# Patient Record
Sex: Male | Born: 1945 | Race: White | Hispanic: No | Smoking: Never smoker
Health system: Southern US, Community
[De-identification: ages and names within clinical notes are randomized; demographics above are authoritative.]

## PROBLEM LIST (undated history)

## (undated) DIAGNOSIS — Z87438 Personal history of other diseases of male genital organs: Secondary | ICD-10-CM

## (undated) DIAGNOSIS — F4321 Adjustment disorder with depressed mood: Secondary | ICD-10-CM

## (undated) DIAGNOSIS — F319 Bipolar disorder, unspecified: Secondary | ICD-10-CM

## (undated) DIAGNOSIS — F4310 Post-traumatic stress disorder, unspecified: Secondary | ICD-10-CM

## (undated) DIAGNOSIS — I2510 Atherosclerotic heart disease of native coronary artery without angina pectoris: Secondary | ICD-10-CM

## (undated) DIAGNOSIS — Z9289 Personal history of other medical treatment: Secondary | ICD-10-CM

## (undated) DIAGNOSIS — Z86010 Personal history of colonic polyps: Secondary | ICD-10-CM

## (undated) DIAGNOSIS — Z8701 Personal history of pneumonia (recurrent): Secondary | ICD-10-CM

## (undated) DIAGNOSIS — E785 Hyperlipidemia, unspecified: Secondary | ICD-10-CM

## (undated) DIAGNOSIS — F329 Major depressive disorder, single episode, unspecified: Secondary | ICD-10-CM

## (undated) DIAGNOSIS — F440 Dissociative amnesia: Secondary | ICD-10-CM

## (undated) DIAGNOSIS — F419 Anxiety disorder, unspecified: Secondary | ICD-10-CM

## (undated) DIAGNOSIS — M1990 Unspecified osteoarthritis, unspecified site: Secondary | ICD-10-CM

## (undated) DIAGNOSIS — I10 Essential (primary) hypertension: Secondary | ICD-10-CM

## (undated) DIAGNOSIS — I219 Acute myocardial infarction, unspecified: Secondary | ICD-10-CM

## (undated) DIAGNOSIS — I252 Old myocardial infarction: Secondary | ICD-10-CM

## (undated) DIAGNOSIS — D649 Anemia, unspecified: Secondary | ICD-10-CM

## (undated) DIAGNOSIS — B019 Varicella without complication: Secondary | ICD-10-CM

## (undated) DIAGNOSIS — G43909 Migraine, unspecified, not intractable, without status migrainosus: Secondary | ICD-10-CM

## (undated) DIAGNOSIS — F32A Depression, unspecified: Secondary | ICD-10-CM

## (undated) HISTORY — DX: Personal history of other medical treatment: Z92.89

## (undated) HISTORY — PX: KNEE CARTILAGE SURGERY: SHX688

## (undated) HISTORY — DX: Essential (primary) hypertension: I10

## (undated) HISTORY — DX: Anemia, unspecified: D64.9

## (undated) HISTORY — DX: Personal history of colonic polyps: Z86.010

## (undated) HISTORY — DX: Atherosclerotic heart disease of native coronary artery without angina pectoris: I25.10

## (undated) HISTORY — DX: Acute myocardial infarction, unspecified: I21.9

## (undated) HISTORY — PX: TONSILLECTOMY: SUR1361

## (undated) HISTORY — PX: ELBOW SURGERY: SHX618

## (undated) HISTORY — DX: Dissociative amnesia: F44.0

## (undated) HISTORY — PX: CATARACT EXTRACTION: SUR2

## (undated) HISTORY — DX: Bipolar disorder, unspecified: F31.9

## (undated) HISTORY — DX: Major depressive disorder, single episode, unspecified: F32.9

## (undated) HISTORY — DX: Adjustment disorder with depressed mood: F43.21

## (undated) HISTORY — DX: Hyperlipidemia, unspecified: E78.5

## (undated) HISTORY — PX: INGUINAL HERNIA REPAIR: SUR1180

## (undated) HISTORY — DX: Post-traumatic stress disorder, unspecified: F43.10

## (undated) HISTORY — DX: Old myocardial infarction: I25.2

## (undated) HISTORY — DX: Unspecified osteoarthritis, unspecified site: M19.90

## (undated) HISTORY — PX: NASAL SEPTUM SURGERY: SHX37

## (undated) HISTORY — DX: Varicella without complication: B01.9

## (undated) HISTORY — DX: Personal history of other diseases of male genital organs: Z87.438

## (undated) HISTORY — PX: CERVICAL DISCECTOMY: SHX98

## (undated) HISTORY — DX: Migraine, unspecified, not intractable, without status migrainosus: G43.909

## (undated) HISTORY — PX: ANKLE SURGERY: SHX546

## (undated) HISTORY — DX: Personal history of pneumonia (recurrent): Z87.01

## (undated) HISTORY — DX: Anxiety disorder, unspecified: F41.9

## (undated) HISTORY — DX: Depression, unspecified: F32.A

## (undated) HISTORY — PX: KNEE ARTHROSCOPY: SUR90

## (undated) SURGERY — Surgical Case
Anesthesia: *Unknown

---

## 1998-02-27 ENCOUNTER — Ambulatory Visit (HOSPITAL_BASED_OUTPATIENT_CLINIC_OR_DEPARTMENT_OTHER): Admission: RE | Admit: 1998-02-27 | Discharge: 1998-02-27 | Payer: Self-pay | Admitting: *Deleted

## 1999-10-08 ENCOUNTER — Encounter: Admission: RE | Admit: 1999-10-08 | Discharge: 1999-10-08 | Payer: Self-pay | Admitting: Neurology

## 1999-10-08 ENCOUNTER — Encounter: Payer: Self-pay | Admitting: Neurology

## 2000-04-02 ENCOUNTER — Emergency Department (HOSPITAL_COMMUNITY): Admission: EM | Admit: 2000-04-02 | Discharge: 2000-04-03 | Payer: Self-pay | Admitting: *Deleted

## 2002-01-29 ENCOUNTER — Inpatient Hospital Stay (HOSPITAL_COMMUNITY): Admission: EM | Admit: 2002-01-29 | Discharge: 2002-02-06 | Payer: Self-pay | Admitting: Psychiatry

## 2002-04-08 ENCOUNTER — Inpatient Hospital Stay (HOSPITAL_COMMUNITY): Admission: EM | Admit: 2002-04-08 | Discharge: 2002-04-12 | Payer: Self-pay | Admitting: Psychiatry

## 2002-04-15 ENCOUNTER — Other Ambulatory Visit (HOSPITAL_COMMUNITY): Admission: RE | Admit: 2002-04-15 | Discharge: 2002-05-06 | Payer: Self-pay | Admitting: Psychiatry

## 2002-11-28 ENCOUNTER — Encounter (INDEPENDENT_AMBULATORY_CARE_PROVIDER_SITE_OTHER): Payer: Self-pay | Admitting: Specialist

## 2002-11-28 ENCOUNTER — Ambulatory Visit (HOSPITAL_COMMUNITY): Admission: RE | Admit: 2002-11-28 | Discharge: 2002-11-28 | Payer: Self-pay | Admitting: Internal Medicine

## 2003-01-26 ENCOUNTER — Inpatient Hospital Stay (HOSPITAL_COMMUNITY): Admission: AD | Admit: 2003-01-26 | Discharge: 2003-01-31 | Payer: Self-pay | Admitting: Psychiatry

## 2003-05-30 ENCOUNTER — Inpatient Hospital Stay (HOSPITAL_COMMUNITY): Admission: EM | Admit: 2003-05-30 | Discharge: 2003-06-04 | Payer: Self-pay | Admitting: Psychiatry

## 2003-06-13 ENCOUNTER — Inpatient Hospital Stay (HOSPITAL_COMMUNITY): Admission: EM | Admit: 2003-06-13 | Discharge: 2003-06-17 | Payer: Self-pay | Admitting: Psychiatry

## 2004-11-26 ENCOUNTER — Ambulatory Visit: Payer: Self-pay | Admitting: Psychology

## 2006-02-17 ENCOUNTER — Inpatient Hospital Stay (HOSPITAL_COMMUNITY): Admission: EM | Admit: 2006-02-17 | Discharge: 2006-02-23 | Payer: Self-pay | Admitting: Psychiatry

## 2006-02-17 ENCOUNTER — Emergency Department (HOSPITAL_COMMUNITY): Admission: EM | Admit: 2006-02-17 | Discharge: 2006-02-17 | Payer: Self-pay | Admitting: *Deleted

## 2006-02-18 ENCOUNTER — Ambulatory Visit: Payer: Self-pay | Admitting: *Deleted

## 2006-03-12 ENCOUNTER — Ambulatory Visit: Payer: Self-pay | Admitting: Psychiatry

## 2006-03-12 ENCOUNTER — Inpatient Hospital Stay (HOSPITAL_COMMUNITY): Admission: AD | Admit: 2006-03-12 | Discharge: 2006-03-17 | Payer: Self-pay | Admitting: Psychiatry

## 2006-05-24 ENCOUNTER — Ambulatory Visit: Payer: Self-pay | Admitting: Psychiatry

## 2006-05-24 ENCOUNTER — Inpatient Hospital Stay (HOSPITAL_COMMUNITY): Admission: RE | Admit: 2006-05-24 | Discharge: 2006-05-29 | Payer: Self-pay | Admitting: Psychiatry

## 2007-06-06 ENCOUNTER — Emergency Department (HOSPITAL_COMMUNITY): Admission: EM | Admit: 2007-06-06 | Discharge: 2007-06-06 | Payer: Self-pay | Admitting: Family Medicine

## 2007-06-08 ENCOUNTER — Emergency Department (HOSPITAL_COMMUNITY): Admission: EM | Admit: 2007-06-08 | Discharge: 2007-06-08 | Payer: Self-pay | Admitting: Emergency Medicine

## 2008-08-22 DIAGNOSIS — I252 Old myocardial infarction: Secondary | ICD-10-CM

## 2008-08-22 HISTORY — DX: Old myocardial infarction: I25.2

## 2009-04-23 DIAGNOSIS — J45909 Unspecified asthma, uncomplicated: Secondary | ICD-10-CM | POA: Insufficient documentation

## 2009-08-22 DIAGNOSIS — Z8701 Personal history of pneumonia (recurrent): Secondary | ICD-10-CM

## 2009-08-22 HISTORY — DX: Personal history of pneumonia (recurrent): Z87.01

## 2010-09-02 HISTORY — PX: STENT PLACEMENT VASCULAR (ARMC HX): HXRAD1737

## 2010-09-12 ENCOUNTER — Encounter: Payer: Self-pay | Admitting: Family Medicine
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
## 2011-01-07 NOTE — Discharge Summary (Signed)
NAME:  Justin [name], Justin [name] NO.:  [id number]   MEDICAL RECORD NO.:  [id number]          PATIENT TYPE:  IPS   LOCATION:  0507                          FACILITY:  [facility]   PHYSICIAN:  Geoffery Lyons, M.D.      DATE OF BIRTH:  03-21-1946   DATE OF ADMISSION:  05/24/2006  DATE OF DISCHARGE:  05/29/2006                                 DISCHARGE SUMMARY   HISTORY OF PRESENT ILLNESS:  This is one of multiple admissions to [facility] for this 65 year old, white male, separated who took  five Ambien the day before this admission feeling anxious and agitated  unable to sleep because of constant thinking and agitation about the  situation with his wife from whom he is separated.  He has been separated  from her since this summer.  He recently found out that she was going out  with another man and now was feeling that he will be unable to go on living  unless he could be with her.  He is unable to get this out of his mind with  thoughts of killing himself by hanging and overdosing.   PAST PSYCHIATRIC HISTORY:  1. Prior history of overdose July 2007.  2. Several admissions to [facility] with last time July 22, to July 27.  3. Prior history of depression and alcohol abuse.  Has been on Wellbutrin,      Zyprexa, Trileptal, Seroquel.  4. Prior history of alcohol abuse.   PAST MEDICAL HISTORY:  Lumbar disc disease with surgeries in 1998 and 2000,  with nerve damage in his right elbow.   MEDICATIONS:  1. Valium 10 mg up to four times a day.  2. Ambien 10 at night.  3. Cymbalta 90 mg per day.  4. Neurontin 300 four times a day.   PHYSICAL EXAMINATION:  Performed and failed to find any acute findings.   MENTAL STATUS EXAMINATION:  Alert, cooperative male with blunt affective,  appears anxious, speaks with soft tone, normal in pace and production.  Mood  is depressed and anxious.  Affect anxious and feeling that there is no way  he can live without his wife.  No acute  delusions.  No homicidal ideations.   LABORATORY DATA AND X-RAY FINDINGS:  White blood cells 6.1, hemoglobin 12.6.  Blood chemistry with sodium 129, potassium 3.9, glucose 122.  Liver enzymes  showed SGPT 16, total bilirubin 1.5, TSH 0.218.  Drug screen for  benzodiazepine as described.   IMPRESSION:  AXIS I:  Major depression.  AXIS II:  No diagnosis.  AXIS III:  Chronic back pain.  AXIS IV:  Moderate; family stressors.  AXIS V:  GAF 65.   HOSPITAL COURSE:  He was admitted and he was started in group psychotherapy.  He was given some Risperdal 0.5 every 6 hours as needed and maintained on  the Percocet 5 one to two four times a day as needed for pain and Valium 5  mg four times a day as needed for anxiety, Cymbalta 90 mg per day, Neurontin  300 four times a  day and Seroquel at bedtime for sleep.  He endorsed  increased symptoms of depression and claims voices are telling him to hurt  himself.  He claims the trigger was his wife giving him mixed messages as if  they were going to get back together and he could not take care of his mind.  Initially, he had severe headache.  By October 5, he was more alert,  insightful in the sense that he would have to let go of his wife and getting  himself out of the mood that without her he could not make it.  We tried to  get a family session in place, but the wife could not be contacted.  Sleep  had been an issue and was given the Seroquel successfully.  During the week  before, he had heard voices telling him to harm himself and calling him  names.  He slept better on the Seroquel.  By October 7, he was calmer and  feeling better.  He was planning to go back to [place] where he was living  alone.  By October 8, he was in full reality.  There were no episodes of  homicidal ideation, no hallucinations or delusions feeling that he was ready  to go home.  He was much more clear headed, insightful in the fact that he  needed to let go of the  relationship.  The fact that she did not come to the  family session, he claims that was a clear message that she did not want to  pursue the relationship anymore.   DISCHARGE DIAGNOSES:  AXIS I:  Major depression with psychotic features.  AXIS II:  No diagnosis.  AXIS III:  Chronic low back pain.  AXIS IV:  Moderate.  AXIS V:  GAF 50.   DISCHARGE MEDICATIONS:  1. Cymbalta 90 mg a day.  2. Neurontin 300 four time a day.  3. Seroquel 50 at bedtime.  4. Percocet 5/325 one to two four times a day as needed.  5. Valium 5 mg four times a day as needed for anxiety.   FOLLOW UP:  Follow up with Ollen Gross.      Geoffery Lyons, M.D.  Electronically Signed     IL/MEDQ  D:  06/19/2006  T:  06/19/2006  Job:  [id number]

## 2011-06-01 LAB — CULTURE, ROUTINE-ABSCESS: Gram Stain: NONE SEEN

## 2014-12-23 ENCOUNTER — Encounter: Payer: Self-pay | Admitting: Family

## 2014-12-23 ENCOUNTER — Ambulatory Visit (INDEPENDENT_AMBULATORY_CARE_PROVIDER_SITE_OTHER): Payer: Medicare Other | Admitting: Family

## 2014-12-23 VITALS — BP 142/100 | HR 77 | Temp 97.5°F | Resp 18 | Ht 72.0 in | Wt 197.0 lb

## 2014-12-23 DIAGNOSIS — IMO0001 Reserved for inherently not codable concepts without codable children: Secondary | ICD-10-CM

## 2014-12-23 DIAGNOSIS — M545 Low back pain, unspecified: Secondary | ICD-10-CM | POA: Insufficient documentation

## 2014-12-23 DIAGNOSIS — R030 Elevated blood-pressure reading, without diagnosis of hypertension: Secondary | ICD-10-CM

## 2014-12-23 DIAGNOSIS — F411 Generalized anxiety disorder: Secondary | ICD-10-CM

## 2014-12-23 DIAGNOSIS — I10 Essential (primary) hypertension: Secondary | ICD-10-CM | POA: Insufficient documentation

## 2014-12-23 MED ORDER — ALPRAZOLAM 0.5 MG PO TABS: 0.5000 mg | ORAL_TABLET | Freq: Two times a day (BID) | ORAL | Status: DC | PRN

## 2014-12-23 NOTE — Assessment & Plan Note (Signed)
Patient noted to have elevated systolic blood pressure today. Follow-up in 1 month for blood pressure check.

## 2014-12-23 NOTE — Patient Instructions (Addendum)
Thank you for choosing [facility].  Summary/Instructions:  Your prescription(s) have been submitted to your pharmacy or been printed and provided for you. Please take as directed and contact our office if you believe you are having problem(s) with the medication(s) or have any questions.  If your symptoms worsen or fail to improve, please contact our office for further instruction, or in case of emergency go directly to the emergency room at the closest medical facility.   Generalized Anxiety Disorder Generalized anxiety disorder (GAD) is a mental disorder. It interferes with life functions, including relationships, work, and school. GAD is different from normal anxiety, which everyone experiences at some point in their lives in response to specific life events and activities. Normal anxiety actually helps us prepare for and get through these life events and activities. Normal anxiety goes away after the event or activity is over.  GAD causes anxiety that is not necessarily related to specific events or activities. It also causes excess anxiety in proportion to specific events or activities. The anxiety associated with GAD is also difficult to control. GAD can vary from mild to severe. People with severe GAD can have intense waves of anxiety with physical symptoms (panic attacks).  SYMPTOMS The anxiety and worry associated with GAD are difficult to control. This anxiety and worry are related to many life events and activities and also occur more days than not for 6 months or longer. People with GAD also have three or more of the following symptoms (one or more in children):  Restlessness.   Fatigue.  Difficulty concentrating.   Irritability.  Muscle tension.  Difficulty sleeping or unsatisfying sleep. DIAGNOSIS GAD is diagnosed through an assessment by your health care provider. Your health care provider will ask you questions aboutyour mood,physical symptoms, and events in  your life. Your health care provider may ask you about your medical history and use of alcohol or drugs, including prescription medicines. Your health care provider may also do a physical exam and blood tests. Certain medical conditions and the use of certain substances can cause symptoms similar to those associated with GAD. Your health care provider may refer you to a mental health specialist for further evaluation. TREATMENT The following therapies are usually used to treat GAD:   Medication. Antidepressant medication usually is prescribed for long-term daily control. Antianxiety medicines may be added in severe cases, especially when panic attacks occur.   Talk therapy (psychotherapy). Certain types of talk therapy can be helpful in treating GAD by providing support, education, and guidance. A form of talk therapy called cognitive behavioral therapy can teach you healthy ways to think about and react to daily life events and activities.  Stress managementtechniques. These include yoga, meditation, and exercise and can be very helpful when they are practiced regularly. A mental health specialist can help determine which treatment is best for you. Some people see improvement with one therapy. However, other people require a combination of therapies. Document Released: 12/03/2012 Document Revised: 12/23/2013 Document Reviewed: 12/03/2012 [employer] Patient Information 2015 [employer]. This information is not intended to replace advice given to you by your health care provider. Make sure you discuss any questions you have with your health care provider.    

## 2014-12-23 NOTE — Assessment & Plan Note (Signed)
Low back pain from questionable source of either bulging disc or paraspinal muscle spasm. Treat conservatively at this time with heat, stretching, and over-the-counter remedies as needed for discomfort. If symptoms worsen or fail to improve additional imaging will be ordered.

## 2014-12-23 NOTE — Progress Notes (Signed)
Subjective:    Patient ID: Justin [name], male    DOB: 21-Sep-1945, 69 y.o.   MRN: [id number]  Chief Complaint  Patient presents with  . Establish Care    has a place on his back that has been hurting every morning when he gets up and sometimes during the day, x2 years getting worse, also needs something for anxiety    HPI:  Justin [name] is a 69 y.o. male with a PMH of arthritis, asthma, depression, anxiety, and hyperlipidemia who presents today for an office visit to establish care    1) Anxiety - Previously diagnosed with anxiety and depression. Currently maintained on amitriptyline for anxiety, depression and sleep. Indicates that he experiences more anxiety than he does depression. There is sleep disturbance with interrupted sleep secondary to his thought processes. Cannot take Ambien - states he woke up in the Emergency Room 3 times following medication use. Was previously tried on Klonopin and Xanax. Denies any adverse effects of either of these medications.   2) Scoliosis - Associated symptom of pain located on the left side of his low back towards his spine has been going on for about 2 years. Describes the pain as sharp and indicates it may take 2-3 hours for him to loosen his back up over the course of the day. Timing of the symptoms is worse in the mornings.  Has tried physical therapy for several weeks and relaxed his muscles, but did not last very long. Has also tried a heating pad which helped some. Indicates that it feels like he gets knots in his back.   Allergies  Allergen Reactions  . Ambien [Zolpidem Tartrate] Other (See Comments)    Unknown reaction    No current outpatient prescriptions on file prior to visit.   No current facility-administered medications on file prior to visit.    Past Medical History  Diagnosis Date  . Asthma   . Arthritis   . Chicken pox   . Depression   . Hyperlipidemia   . Migraines     Past Surgical History  Procedure Laterality  Date  . Nasal septum surgery    . Hernia repair    . Neck surgery    . Elbow surgery Left   . Knee surgery Bilateral     Family History  Problem Relation Age of Onset  . Arthritis Mother   . Heart disease Mother   . Hypertension Mother   . Arthritis Father   . Hyperlipidemia Father   . Heart disease Father   . Stroke Father   . Hypertension Father   . Multiple sclerosis Sister   . Diabetes Sister   . Lung cancer Paternal Grandmother   . Diabetes Sister     History   Social History  . Marital Status: Married    Spouse Name: N/A  . Number of Children: 2  . Years of Education: 12   Occupational History  . Retired    Social History Main Topics  . Smoking status: Never Smoker   . Smokeless tobacco: Never Used  . Alcohol Use: No  . Drug Use: No  . Sexual Activity: Not on file   Other Topics Concern  . Not on file   Social History Narrative   Fun: Work out in the yard.   Denies religious beliefs effecting healthcare.     Review of Systems  Musculoskeletal: Positive for back pain.  Neurological: Negative for numbness.       Objective:  BP 142/100 mmHg  Pulse 77  Temp(Src) 97.5 F (36.4 C) (Oral)  Resp 18  Ht 6' (1.829 m)  Wt 197 lb (89.359 kg)  BMI 26.71 kg/m2  SpO2 98% Nursing note and vital signs reviewed.  Physical Exam  Constitutional: He is oriented to person, place, and time. He appears well-developed and well-nourished. No distress.  Cardiovascular: Normal rate, regular rhythm, normal heart sounds and intact distal pulses.   Pulmonary/Chest: Effort normal and breath sounds normal.  Musculoskeletal:  No obvious deformity, discoloration, or edema of low back noted. Palpable tenderness along paraspinal musculature of left lumbar spine. Patient is able to flex, however with pain. Extension is described as stretching. Rotation and lateral bending both result in pain in both directions. Straight leg raise is negative. Reflexes are intact and  appropriate. Distal pulses and sensation are intact and appropriate.  Neurological: He is alert and oriented to person, place, and time.  Skin: Skin is warm and dry.  Psychiatric: He has a normal mood and affect. His behavior is normal. Judgment and thought content normal.       Assessment & Plan:

## 2014-12-23 NOTE — Assessment & Plan Note (Signed)
Symptoms consistent with generalized anxiety disorder that are resulting in a sleep disturbance. Discontinue amitriptyline and start Xanax as needed for anxiety and sleep. Follow-up in one month to determine effectiveness of current strategy. Discussed use of good sleep hygiene and over-the-counter melatonin as needed for sleep as well.

## 2014-12-23 NOTE — Progress Notes (Signed)
Pre visit review using our clinic review tool, if applicable. No additional management support is needed unless otherwise documented below in the visit note. 

## 2014-12-26 ENCOUNTER — Emergency Department (HOSPITAL_COMMUNITY)
Admission: EM | Admit: 2014-12-26 | Discharge: 2014-12-26 | Disposition: A | Discharge status: Home / Self Care | Payer: Medicare Other | Attending: Emergency Medicine | Admitting: Emergency Medicine

## 2014-12-26 ENCOUNTER — Encounter (HOSPITAL_COMMUNITY): Payer: Self-pay | Admitting: Emergency Medicine

## 2014-12-26 DIAGNOSIS — M545 Low back pain: Secondary | ICD-10-CM | POA: Diagnosis present

## 2014-12-26 DIAGNOSIS — J45909 Unspecified asthma, uncomplicated: Secondary | ICD-10-CM | POA: Insufficient documentation

## 2014-12-26 DIAGNOSIS — Z79899 Other long term (current) drug therapy: Secondary | ICD-10-CM | POA: Diagnosis not present

## 2014-12-26 DIAGNOSIS — F329 Major depressive disorder, single episode, unspecified: Secondary | ICD-10-CM | POA: Insufficient documentation

## 2014-12-26 DIAGNOSIS — Z7902 Long term (current) use of antithrombotics/antiplatelets: Secondary | ICD-10-CM | POA: Diagnosis not present

## 2014-12-26 DIAGNOSIS — Z8619 Personal history of other infectious and parasitic diseases: Secondary | ICD-10-CM | POA: Insufficient documentation

## 2014-12-26 DIAGNOSIS — G8929 Other chronic pain: Secondary | ICD-10-CM | POA: Diagnosis not present

## 2014-12-26 DIAGNOSIS — E785 Hyperlipidemia, unspecified: Secondary | ICD-10-CM | POA: Insufficient documentation

## 2014-12-26 DIAGNOSIS — Z8679 Personal history of other diseases of the circulatory system: Secondary | ICD-10-CM | POA: Diagnosis not present

## 2014-12-26 LAB — URINALYSIS, ROUTINE W REFLEX MICROSCOPIC
BILIRUBIN URINE: NEGATIVE
Glucose, UA: NEGATIVE mg/dL
HGB URINE DIPSTICK: NEGATIVE
Ketones, ur: NEGATIVE mg/dL
LEUKOCYTES UA: NEGATIVE
Nitrite: NEGATIVE
Protein, ur: NEGATIVE mg/dL
Specific Gravity, Urine: 1.014 (ref 1.005–1.030)
Urobilinogen, UA: 0.2 mg/dL (ref 0.0–1.0)
pH: 6.5 (ref 5.0–8.0)

## 2014-12-26 MED ORDER — METHOCARBAMOL 500 MG PO TABS
1000.0000 mg | ORAL_TABLET | Freq: Once | ORAL | Status: AC
  Administered 2014-12-26: 1000 mg via ORAL
  Filled 2014-12-26: qty 2

## 2014-12-26 MED ORDER — HYDROCODONE-ACETAMINOPHEN 5-325 MG PO TABS: ORAL_TABLET | ORAL | Status: DC

## 2014-12-26 MED ORDER — PREDNISONE 20 MG PO TABS
60.0000 mg | ORAL_TABLET | Freq: Once | ORAL | Status: AC
  Administered 2014-12-26: 60 mg via ORAL
  Filled 2014-12-26: qty 3

## 2014-12-26 MED ORDER — METHOCARBAMOL 500 MG PO TABS: 1000.0000 mg | ORAL_TABLET | Freq: Four times a day (QID) | ORAL | Status: DC | PRN

## 2014-12-26 MED ORDER — PREDNISONE 50 MG PO TABS: ORAL_TABLET | ORAL | Status: DC

## 2014-12-26 MED ORDER — HYDROCODONE-ACETAMINOPHEN 5-325 MG PO TABS
1.0000 | ORAL_TABLET | Freq: Once | ORAL | Status: AC
  Administered 2014-12-26: 1 via ORAL
  Filled 2014-12-26: qty 1

## 2014-12-26 NOTE — Discharge Instructions (Signed)
Take robaxin and/or Vicodin for breakthrough pain, do not drink alcohol, drive, care for children or perfom other critical tasks while taking robaxin and/or Vicodin .  Please follow with your primary care doctor in the next 2 days for a check-up. They must obtain records for further management.   Do not hesitate to return to the Emergency Department for any new, worsening or concerning symptoms.   Back Pain, Adult Low back pain is very common. About 1 in 5 people have back pain.The cause of low back pain is rarely dangerous. The pain often gets better over time.About half of people with a sudden onset of back pain feel better in just 2 weeks. About 8 in 10 people feel better by 6 weeks.  CAUSES Some common causes of back pain include:  Strain of the muscles or ligaments supporting the spine.  Wear and tear (degeneration) of the spinal discs.  Arthritis.  Direct injury to the back. DIAGNOSIS Most of the time, the direct cause of low back pain is not known.However, back pain can be treated effectively even when the exact cause of the pain is unknown.Answering your caregiver's questions about your overall health and symptoms is one of the most accurate ways to make sure the cause of your pain is not dangerous. If your caregiver needs more information, he or she may order lab work or imaging tests (X-rays or MRIs).However, even if imaging tests show changes in your back, this usually does not require surgery. HOME CARE INSTRUCTIONS For many people, back pain returns.Since low back pain is rarely dangerous, it is often a condition that people can learn to [facility] their own.   Remain active. It is stressful on the back to sit or stand in one place. Do not sit, drive, or stand in one place for more than 30 minutes at a time. Take short walks on level surfaces as soon as pain allows.Try to increase the length of time you walk each day.  Do not stay in bed.Resting more than 1 or 2 days can  delay your recovery.  Do not avoid exercise or work.Your body is made to move.It is not dangerous to be active, even though your back may hurt.Your back will likely heal faster if you return to being active before your pain is gone.  Pay attention to your body when you bend and lift. Many people have less discomfortwhen lifting if they bend their knees, keep the load close to their bodies,and avoid twisting. Often, the most comfortable positions are those that put less stress on your recovering back.  Find a comfortable position to sleep. Use a firm mattress and lie on your side with your knees slightly bent. If you lie on your back, put a pillow under your knees.  Only take over-the-counter or prescription medicines as directed by your caregiver. Over-the-counter medicines to reduce pain and inflammation are often the most helpful.Your caregiver may prescribe muscle relaxant drugs.These medicines help dull your pain so you can more quickly return to your normal activities and healthy exercise.  Put ice on the injured area.  Put ice in a plastic bag.  Place a towel between your skin and the bag.  Leave the ice on for 15-20 minutes, 03-04 times a day for the first 2 to 3 days. After that, ice and heat may be alternated to reduce pain and spasms.  Ask your caregiver about trying back exercises and gentle massage. This may be of some benefit.  Avoid feeling anxious or  stressed.Stress increases muscle tension and can worsen back pain.It is important to recognize when you are anxious or stressed and learn ways to manage it.Exercise is a great option. SEEK MEDICAL CARE IF:  You have pain that is not relieved with rest or medicine.  You have pain that does not improve in 1 week.  You have new symptoms.  You are generally not feeling well. SEEK IMMEDIATE MEDICAL CARE IF:   You have pain that radiates from your back into your legs.  You develop new bowel or bladder control  problems.  You have unusual weakness or numbness in your arms or legs.  You develop nausea or vomiting.  You develop abdominal pain.  You feel faint. Document Released: 08/08/2005 Document Revised: 02/07/2012 Document Reviewed: 12/10/2013 [facility] Patient Information 2015 [place], [place]. This information is not intended to replace advice given to you by your health care provider. Make sure you discuss any questions you have with your health care provider.

## 2014-12-26 NOTE — ED Notes (Signed)
Pt is aware we need a urine sample. Pt stated he went before he left the house. Pt will notify when he is able to void

## 2014-12-26 NOTE — ED Notes (Signed)
Pt c/o chronic back pain, exacerbation onset yesterday, pain worsened further onset this morning, pain in back and left flank radiating into spine and hip. Pt denies any urinary symptoms.

## 2014-12-26 NOTE — ED Provider Notes (Signed)
CSN: [id number]     Arrival date & time 12/26/14  1222 History   First MD Initiated Contact with Patient 12/26/14 1240     Chief Complaint  Patient presents with  . Back Pain     (Consider location/radiation/quality/duration/timing/severity/associated sxs/prior Treatment) HPI   Justin [name] is a 69 y.o. male complaining of severe exacerbation of his chronic low back pain. Patient states that the pain is 8 out of 10, aching,left low back and radiates down to the gluteus. Patient has been taking amitriptyline at home with little relief. He denies any recent trauma, fever, chills, change in bowel or bladder habits, history of IV drug use, history of cancer weakness, numbness, difficulty ambulating. States that the pain was so severe that he laid in bed all day yesterday. States the pain is exacerbated by movement and sitting up from a supine position.   Past Medical History  Diagnosis Date  . Asthma   . Arthritis   . Chicken pox   . Depression   . Hyperlipidemia   . Migraines    Past Surgical History  Procedure Laterality Date  . Nasal septum surgery    . Hernia repair    . Neck surgery    . Elbow surgery Left   . Knee surgery Bilateral    Family History  Problem Relation Age of Onset  . Arthritis Mother   . Heart disease Mother   . Hypertension Mother   . Arthritis Father   . Hyperlipidemia Father   . Heart disease Father   . Stroke Father   . Hypertension Father   . Multiple sclerosis Sister   . Diabetes Sister   . Lung cancer Paternal Grandmother   . Diabetes Sister    History  Substance Use Topics  . Smoking status: Never Smoker   . Smokeless tobacco: Never Used  . Alcohol Use: No    Review of Systems  10 systems reviewed and found to be negative, except as noted in the HPI.   Allergies  Ambien  Home Medications   Prior to Admission medications   Medication Sig Start Date End Date Taking? Authorizing Provider  ALPRAZolam Duanne Moron) 0.5 MG tablet Take 1  tablet (0.5 mg total) by mouth 2 (two) times daily as needed for anxiety. 12/23/14  Yes Golden Circle, FNP  amitriptyline (ELAVIL) 50 MG tablet Take 25 mg by mouth daily as needed (back pain).   Yes Historical Provider, MD  clopidogrel (PLAVIX) 75 MG tablet Take 75 mg by mouth daily.   Yes Historical Provider, MD  rosuvastatin (CRESTOR) 20 MG tablet Take 20 mg by mouth daily.   Yes Historical Provider, MD  HYDROcodone-acetaminophen (NORCO/VICODIN) 5-325 MG per tablet Take 1-2 tablets by mouth every 6 hours as needed for pain and/or cough. 12/26/14   Gianah Batt, PA-C  methocarbamol (ROBAXIN) 500 MG tablet Take 2 tablets (1,000 mg total) by mouth 4 (four) times daily as needed (Pain). 12/26/14   Shenelle Klas, PA-C  nitroGLYCERIN (NITROSTAT) 0.3 MG SL tablet Place 0.3 mg under the tongue every 5 (five) minutes as needed for chest pain.    Historical Provider, MD  predniSONE (DELTASONE) 50 MG tablet Take 1 tablet daily with breakfast 12/26/14   Elmyra Ricks Tarin Johndrow, PA-C   BP 137/91 mmHg  Pulse 59  Temp(Src) 97.5 F (36.4 C) (Oral)  Resp 20  SpO2 100% Physical Exam  Constitutional: He appears well-developed and well-nourished.  HENT:  Head: Normocephalic.  Eyes: Conjunctivae are normal.  Neck: Normal  range of motion.  Cardiovascular: Normal rate, regular rhythm and intact distal pulses.   Pulmonary/Chest: Effort normal.  Abdominal: Soft. There is no tenderness.  Neurological: He is alert.  No point tenderness to percussion of lumbar spinal processes.  No TTP or paraspinal muscular spasm. Strength is 5 out of 5 to bilateral lower extremities at hip and knee; extensor hallucis longus 5 out of 5. Ankle strength 5 out of 5, no clonus, neurovascularly intact. No saddle anaesthesia. Patellar reflexes are 2+ bilaterally.    Straight leg raise is negative bilaterally.  Psychiatric: He has a normal mood and affect.  Nursing note and vitals reviewed.   ED Course  Procedures (including critical  care time) Labs Review Labs Reviewed  URINALYSIS, ROUTINE W REFLEX MICROSCOPIC    Imaging Review No results found.   EKG Interpretation None      MDM   Final diagnoses:  Acute exacerbation of chronic low back pain    Filed Vitals:   12/26/14 1233 12/26/14 1434  BP: 159/90 137/91  Pulse: 69 59  Temp: 97.4 F (36.3 C) 97.5 F (36.4 C)  TempSrc: Oral Oral  Resp: 19 20  SpO2: 100% 100%    Medications  HYDROcodone-acetaminophen (NORCO/VICODIN) 5-325 MG per tablet 1 tablet (1 tablet Oral Given 12/26/14 1343)  methocarbamol (ROBAXIN) tablet 1,000 mg (1,000 mg Oral Given 12/26/14 1343)  predniSONE (DELTASONE) tablet 60 mg (60 mg Oral Given 12/26/14 1343)    Justin [name] is a pleasant 69 y.o. male presenting with exacerbation of chronic back pain.  No neurological deficits and normal neuro exam.  Patient can walk but states is painful.  No loss of bowel or bladder control.  No concern for cauda equina.  No fever, night sweats, weight loss, h/o cancer, IVDU.  RICE protocol and pain medicine indicated and discussed with patient.  This is a shared visit with the attending physician who personally evaluated the patient and agrees with the care plan.   Evaluation does not show pathology that would require ongoing emergent intervention or inpatient treatment. Pt is hemodynamically stable and mentating appropriately. Discussed findings and plan with patient/guardian, who agrees with care plan. All questions answered. Return precautions discussed and outpatient follow up given.   New Prescriptions   HYDROCODONE-ACETAMINOPHEN (NORCO/VICODIN) 5-325 MG PER TABLET    Take 1-2 tablets by mouth every 6 hours as needed for pain and/or cough.   METHOCARBAMOL (ROBAXIN) 500 MG TABLET    Take 2 tablets (1,000 mg total) by mouth 4 (four) times daily as needed (Pain).   PREDNISONE (DELTASONE) 50 MG TABLET    Take 1 tablet daily with breakfast         Monico Blitz, PA-C 12/26/14  Radom Yao, MD 12/29/14 0930

## 2014-12-26 NOTE — ED Notes (Signed)
Patient was educated not to drive, operate heavy machinery, or drink alcohol while taking narcotic medication.  

## 2015-01-27 ENCOUNTER — Other Ambulatory Visit: Payer: Self-pay | Admitting: Family

## 2015-03-02 ENCOUNTER — Telehealth: Payer: Self-pay | Admitting: Family

## 2015-03-02 MED ORDER — ALPRAZOLAM 0.5 MG PO TABS: 0.5000 mg | ORAL_TABLET | Freq: Two times a day (BID) | ORAL | Status: DC | PRN

## 2015-03-02 NOTE — Telephone Encounter (Signed)
Patient requesting refill for ALPRAZolam (XANAX) 0.5 MG tablet [[id number]. Pharmacy is [facility] on [address] in [place]

## 2015-03-02 NOTE — Telephone Encounter (Signed)
Medication refilled

## 2015-03-13 ENCOUNTER — Ambulatory Visit (INDEPENDENT_AMBULATORY_CARE_PROVIDER_SITE_OTHER): Payer: Medicare Other | Admitting: Family

## 2015-03-13 ENCOUNTER — Encounter: Payer: Self-pay | Admitting: Family

## 2015-03-13 VITALS — BP 132/92 | HR 71 | Temp 97.8°F | Resp 18 | Ht 72.0 in | Wt 185.6 lb

## 2015-03-13 DIAGNOSIS — R21 Rash and other nonspecific skin eruption: Secondary | ICD-10-CM | POA: Diagnosis not present

## 2015-03-13 MED ORDER — CLOTRIMAZOLE-BETAMETHASONE 1-0.05 % EX CREA: 1.0000 "application " | TOPICAL_CREAM | Freq: Two times a day (BID) | CUTANEOUS | Status: DC

## 2015-03-13 NOTE — Assessment & Plan Note (Signed)
Symptoms and exam consistent of with possible fungal infection. Start Lotrisone. Follow up if symptoms worsen or fail to improve.

## 2015-03-13 NOTE — Patient Instructions (Addendum)
Thank you for choosing [facility].  Summary/Instructions:  Your prescription(s) have been submitted to your pharmacy or been printed and provided for you. Please take as directed and contact our office if you believe you are having problem(s) with the medication(s) or have any questions.  If your symptoms worsen or fail to improve, please contact our office for further instruction, or in case of emergency go directly to the emergency room at the closest medical facility.     

## 2015-03-13 NOTE — Progress Notes (Signed)
Pre visit review using our clinic review tool, if applicable. No additional management support is needed unless otherwise documented below in the visit note. 

## 2015-03-13 NOTE — Progress Notes (Signed)
   Subjective:    Patient ID: Justin [name], male    DOB: 1945/09/10, 69 y.o.   MRN: [id number]  Chief Complaint  Patient presents with  . Rash    x1 month, has a place on his left foot and it has started getting red and itchy    HPI:  Justin [name] is a 69 y.o. male with a PMH of  Anxiety, low back pain, and elevated systolic pressure who presents today for an acute office visit.  This is a new problem. Associated symptoms of a rash that is lcoated on his left foot near his 4th and 5th toes that has been going on for about 1 month. Described as itchy and red. Modifying factors include cortisone which helped with the itching but did not provide relief. Notes that the color of the rash has become darker since this morning.   Allergies  Allergen Reactions  . Ambien [Zolpidem Tartrate] Other (See Comments)    Unknown reaction    Current Outpatient Prescriptions on File Prior to Visit  Medication Sig Dispense Refill  . ALPRAZolam (XANAX) 0.5 MG tablet Take 1 tablet (0.5 mg total) by mouth 2 (two) times daily as needed. for anxiety 60 tablet 0  . amitriptyline (ELAVIL) 50 MG tablet Take 25 mg by mouth daily as needed (back pain).    Marland Kitchen clopidogrel (PLAVIX) 75 MG tablet Take 75 mg by mouth daily.    . nitroGLYCERIN (NITROSTAT) 0.3 MG SL tablet Place 0.3 mg under the tongue every 5 (five) minutes as needed for chest pain.    . rosuvastatin (CRESTOR) 20 MG tablet Take 20 mg by mouth daily.     No current facility-administered medications on file prior to visit.    Review of Systems  Constitutional: Negative for fever and chills.  Skin: Positive for rash.      Objective:    BP 132/92 mmHg  Pulse 71  Temp(Src) 97.8 F (36.6 C) (Oral)  Resp 18  Ht 6' (1.829 m)  Wt 185 lb 9.6 oz (84.188 kg)  BMI 25.17 kg/m2  SpO2 96% Nursing note and vital signs reviewed.  Physical Exam  Constitutional: He is oriented to person, place, and time. He appears well-developed and well-nourished. No  distress.  Cardiovascular: Normal rate, regular rhythm, normal heart sounds and intact distal pulses.   Pulmonary/Chest: Effort normal and breath sounds normal.  Neurological: He is alert and oriented to person, place, and time.  Skin: Skin is warm and dry.  Annular shaped rash with red borders and lighter center and mild scaling.  Psychiatric: He has a normal mood and affect. His behavior is normal. Judgment and thought content normal.       Assessment & Plan:   Problem List Items Addressed This Visit      Musculoskeletal and Integument   Rash and nonspecific skin eruption - Primary    Symptoms and exam consistent of with possible fungal infection. Start Lotrisone. Follow up if symptoms worsen or fail to improve.       Relevant Medications   clotrimazole-betamethasone (LOTRISONE) cream

## 2015-03-26 ENCOUNTER — Encounter: Payer: Self-pay | Admitting: Gastroenterology

## 2015-03-26 ENCOUNTER — Encounter: Payer: Self-pay | Admitting: Family

## 2015-03-26 ENCOUNTER — Telehealth: Payer: Self-pay | Admitting: Family

## 2015-03-26 ENCOUNTER — Other Ambulatory Visit (INDEPENDENT_AMBULATORY_CARE_PROVIDER_SITE_OTHER): Payer: Medicare Other

## 2015-03-26 ENCOUNTER — Ambulatory Visit (INDEPENDENT_AMBULATORY_CARE_PROVIDER_SITE_OTHER): Payer: Medicare Other | Admitting: Family

## 2015-03-26 VITALS — BP 134/92 | HR 91 | Temp 98.0°F | Resp 18 | Ht 72.0 in | Wt 185.0 lb

## 2015-03-26 DIAGNOSIS — Z125 Encounter for screening for malignant neoplasm of prostate: Secondary | ICD-10-CM | POA: Diagnosis not present

## 2015-03-26 DIAGNOSIS — Z0000 Encounter for general adult medical examination without abnormal findings: Secondary | ICD-10-CM | POA: Diagnosis not present

## 2015-03-26 DIAGNOSIS — E785 Hyperlipidemia, unspecified: Secondary | ICD-10-CM | POA: Diagnosis not present

## 2015-03-26 DIAGNOSIS — Z23 Encounter for immunization: Secondary | ICD-10-CM | POA: Diagnosis not present

## 2015-03-26 DIAGNOSIS — Z0001 Encounter for general adult medical examination with abnormal findings: Secondary | ICD-10-CM | POA: Insufficient documentation

## 2015-03-26 LAB — CBC
HEMATOCRIT: 44.2 % (ref 39.0–52.0)
Hemoglobin: 14.9 g/dL (ref 13.0–17.0)
MCHC: 33.6 g/dL (ref 30.0–36.0)
MCV: 93.4 fl (ref 78.0–100.0)
PLATELETS: 316 10*3/uL (ref 150.0–400.0)
RBC: 4.74 Mil/uL (ref 4.22–5.81)
RDW: 13.4 % (ref 11.5–15.5)
WBC: 5.3 10*3/uL (ref 4.0–10.5)

## 2015-03-26 LAB — BASIC METABOLIC PANEL
BUN: 12 mg/dL (ref 6–23)
CO2: 28 meq/L (ref 19–32)
CREATININE: 1.06 mg/dL (ref 0.40–1.50)
Calcium: 10 mg/dL (ref 8.4–10.5)
Chloride: 101 mEq/L (ref 96–112)
GFR: 73.66 mL/min (ref >60.00)
GLUCOSE: 105 mg/dL — AB (ref 70–99)
Potassium: 4.9 mEq/L (ref 3.5–5.1)
Sodium: 136 mEq/L (ref 135–145)

## 2015-03-26 LAB — PSA: PSA: 1.48 ng/mL (ref 0.10–4.00)

## 2015-03-26 LAB — LIPID PANEL
Cholesterol: 141 mg/dL (ref 0–200)
HDL: 68.5 mg/dL (ref >39.00)
LDL Cholesterol: 55 mg/dL (ref 0–99)
NONHDL: 72.12
TRIGLYCERIDES: 87 mg/dL (ref 0.0–149.0)
Total CHOL/HDL Ratio: 2
VLDL: 17.4 mg/dL (ref 0.0–40.0)

## 2015-03-26 LAB — TSH: TSH: 0.59 u[IU]/mL (ref 0.35–4.50)

## 2015-03-26 MED ORDER — ALPRAZOLAM 0.5 MG PO TABS: 0.5000 mg | ORAL_TABLET | Freq: Two times a day (BID) | ORAL | Status: DC | PRN

## 2015-03-26 NOTE — Patient Instructions (Addendum)
Thank you for choosing [facility].  Summary/Instructions:  Your prescription(s) have been submitted to your pharmacy or been printed and provided for you. Please take as directed and contact our office if you believe you are having problem(s) with the medication(s) or have any questions.  Please stop by the lab on the basement level of the building for your blood work. Your results will be released to Jane Lew (or called to you) after review, usually within 72 hours after test completion. If any changes need to be made, you will be notified at that same time.  If your symptoms worsen or fail to improve, please contact our office for further instruction, or in case of emergency go directly to the emergency room at the closest medical facility.   Health Maintenance  Topic Date Due  . TETANUS/TDAP  05/19/1965  . COLONOSCOPY  05/19/1996  . ZOSTAVAX  05/19/2006  . PNA vac Low Risk Adult (1 of 2 - PCV13) 05/20/2011  . INFLUENZA VACCINE  03/23/2015    Health Maintenance A healthy lifestyle and preventative care can promote health and wellness.  Maintain regular health, dental, and eye exams.  Eat a healthy diet. Foods like vegetables, fruits, whole grains, low-fat dairy products, and lean protein foods contain the nutrients you need and are low in calories. Decrease your intake of foods high in solid fats, added sugars, and salt. Get information about a proper diet from your health care provider, if necessary.  Regular physical exercise is one of the most important things you can do for your health. Most adults should get at least 150 minutes of moderate-intensity exercise (any activity that increases your heart rate and causes you to sweat) each week. In addition, most adults need muscle-strengthening exercises on 2 or more days a week.   Maintain a healthy weight. The body mass index (BMI) is a screening tool to identify possible weight problems. It provides an estimate of body fat  based on height and weight. Your health care provider can find your BMI and can help you achieve or maintain a healthy weight. For males 20 years and older:  A BMI below 18.5 is considered underweight.  A BMI of 18.5 to 24.9 is normal.  A BMI of 25 to 29.9 is considered overweight.  A BMI of 30 and above is considered obese.  Maintain normal blood lipids and cholesterol by exercising and minimizing your intake of saturated fat. Eat a balanced diet with plenty of fruits and vegetables. Blood tests for lipids and cholesterol should begin at age 67 and be repeated every 5 years. If your lipid or cholesterol levels are high, you are over age 24, or you are at high risk for heart disease, you may need your cholesterol levels checked more frequently.Ongoing high lipid and cholesterol levels should be treated with medicines if diet and exercise are not working.  If you smoke, find out from your health care provider how to quit. If you do not use tobacco, do not start.  Lung cancer screening is recommended for adults aged 27-80 years who are at high risk for developing lung cancer because of a history of smoking. A yearly low-dose CT scan of the lungs is recommended for people who have at least a 30-pack-year history of smoking and are current smokers or have quit within the past 15 years. A pack year of smoking is smoking an average of 1 pack of cigarettes a day for 1 year (for example, a 30-pack-year history of smoking could  mean smoking 1 pack a day for 30 years or 2 packs a day for 15 years). Yearly screening should continue until the smoker has stopped smoking for at least 15 years. Yearly screening should be stopped for people who develop a health problem that would prevent them from having lung cancer treatment.  If you choose to drink alcohol, do not have more than 2 drinks per day. One drink is considered to be 12 oz (360 mL) of beer, 5 oz (150 mL) of wine, or 1.5 oz (45 mL) of liquor.  Avoid the  use of street drugs. Do not share needles with anyone. Ask for help if you need support or instructions about stopping the use of drugs.  High blood pressure causes heart disease and increases the risk of stroke. Blood pressure should be checked at least every 1-2 years. Ongoing high blood pressure should be treated with medicines if weight loss and exercise are not effective.  If you are 21-47 years old, ask your health care provider if you should take aspirin to prevent heart disease.  Diabetes screening involves taking a blood sample to check your fasting blood sugar level. This should be done once every 3 years after age 62 if you are at a normal weight and without risk factors for diabetes. Testing should be considered at a younger age or be carried out more frequently if you are overweight and have at least 1 risk factor for diabetes.  Colorectal cancer can be detected and often prevented. Most routine colorectal cancer screening begins at the age of 21 and continues through age 80. However, your health care provider may recommend screening at an earlier age if you have risk factors for colon cancer. On a yearly basis, your health care provider may provide home test kits to check for hidden blood in the stool. A small camera at the end of a tube may be used to directly examine the colon (sigmoidoscopy or colonoscopy) to detect the earliest forms of colorectal cancer. Talk to your health care provider about this at age 58 when routine screening begins. A direct exam of the colon should be repeated every 5-10 years through age 81, unless early forms of precancerous polyps or small growths are found.  People who are at an increased risk for hepatitis B should be screened for this virus. You are considered at high risk for hepatitis B if:  You were born in a country where hepatitis B occurs often. Talk with your health care provider about which countries are considered high risk.  Your parents were  born in a high-risk country and you have not received a shot to protect against hepatitis B (hepatitis B vaccine).  You have HIV or AIDS.  You use needles to inject street drugs.  You live with, or have sex with, someone who has hepatitis B.  You are a man who has sex with other men (MSM).  You get hemodialysis treatment.  You take certain medicines for conditions like cancer, organ transplantation, and autoimmune conditions.  Hepatitis C blood testing is recommended for all people born from [birth year] through 1965 and any individual with known risk factors for hepatitis C.  Healthy men should no longer receive prostate-specific antigen (PSA) blood tests as part of routine cancer screening. Talk to your health care provider about prostate cancer screening.  Testicular cancer screening is not recommended for adolescents or adult males who have no symptoms. Screening includes self-exam, a health care provider exam, and other screening  tests. Consult with your health care provider about any symptoms you have or any concerns you have about testicular cancer.  Practice safe sex. Use condoms and avoid high-risk sexual practices to reduce the spread of sexually transmitted infections (STIs).  You should be screened for STIs, including gonorrhea and chlamydia if:  You are sexually active and are younger than 24 years.  You are older than 24 years, and your health care provider tells you that you are at risk for this type of infection.  Your sexual activity has changed since you were last screened, and you are at an increased risk for chlamydia or gonorrhea. Ask your health care provider if you are at risk.  If you are at risk of being infected with HIV, it is recommended that you take a prescription medicine daily to prevent HIV infection. This is called pre-exposure prophylaxis (PrEP). You are considered at risk if:  You are a man who has sex with other men (MSM).  You are a heterosexual man who  is sexually active with multiple partners.  You take drugs by injection.  You are sexually active with a partner who has HIV.  Talk with your health care provider about whether you are at high risk of being infected with HIV. If you choose to begin PrEP, you should first be tested for HIV. You should then be tested every 3 months for as long as you are taking PrEP.  Use sunscreen. Apply sunscreen liberally and repeatedly throughout the day. You should seek shade when your shadow is shorter than you. Protect yourself by wearing long sleeves, pants, a wide-brimmed hat, and sunglasses year round whenever you are outdoors.  Tell your health care provider of new moles or changes in moles, especially if there is a change in shape or color. Also, tell your health care provider if a mole is larger than the size of a pencil eraser.  A one-time screening for abdominal aortic aneurysm (AAA) and surgical repair of large AAAs by ultrasound is recommended for men aged 60-75 years who are current or former smokers.  Stay current with your vaccines (immunizations). Document Released: 02/04/2008 Document Revised: 08/13/2013 Document Reviewed: 01/03/2011 [facility] Patient Information 2015 [place], [place]. This information is not intended to replace advice given to you by your health care provider. Make sure you discuss any questions you have with your health care provider.   Advance Directive Advance directives are the legal documents that allow you to make choices about your health care and medical treatment if you cannot speak for yourself. Advance directives are a way for you to communicate your wishes to family, friends, and health care providers. The specified people can then convey your decisions about end-of-life care to avoid confusion if you should become unable to communicate. Ideally, the process of discussing and writing advance directives should happen over time rather than making decisions all at  once. Advance directives can be modified as your situation changes, and you can change your mind at any time, even after you have signed the advance directives. Each state has its own laws regarding advance directives. You may want to check with your health care provider, attorney, or state representative about the law in your state. Below are some examples of advance directives. LIVING WILL A living will is a set of instructions documenting your wishes about medical care when you cannot care for yourself. It is used if you become:  Terminally ill.  Incapacitated.  Unable to communicate.  Unable to  make decisions. Items to consider in your living will include:  The use or non-use of life-sustaining equipment, such as dialysis machines and breathing machines (ventilators).  A do not resuscitate (DNR) order, which is the instruction not to use cardiopulmonary resuscitation (CPR) if breathing or heartbeat stops.  Tube feeding.  Withholding of food and fluids.  Comfort (palliative) care when the goal becomes comfort rather than a cure.  Organ and tissue donation. A living will does not give instructions about distribution of your money and property if you should pass away. It is advisable to seek the expert advice of a lawyer in drawing up a will regarding your possessions. Decisions about taxes, beneficiaries, and asset distribution will be legally binding. This process can relieve your family and friends of any burdens surrounding disputes or questions that may come up about the allocation of your assets. DO NOT RESUSCITATE (DNR) A do not resuscitate (DNR) order is a request to not have CPR in the event that your heart stops beating or you stop breathing. Unless given other instructions, a health care provider will try to help any patient whose heart has stopped or who has stopped breathing.  HEALTH CARE PROXY AND DURABLE POWER OF ATTORNEY FOR HEALTH CARE A health care proxy is a person  (agent) appointed to make medical decisions for you if you cannot. Generally, people choose someone they know well and trust to represent their preferences when they can no longer do so. You should be sure to ask this person for agreement to act as your agent. An agent may have to exercise judgment in the event of a medical decision for which your wishes are not known. The durable power of attorney for health care is the legal document that names your health care proxy. Once written, it should be:  Signed.  Notarized.  Dated.  Copied.  Witnessed.  Incorporated into your medical record. You may also want to appoint someone to manage your financial affairs if you cannot. This is called a durable power of attorney for finances. It is a separate legal document from the durable power of attorney for health care. You may choose the same person or someone different from your health care proxy to act as your agent in financial matters. Document Released: 11/15/2007 Document Revised: 08/13/2013 Document Reviewed: 12/26/2012 [facility] Patient Information 2015 [place], [place]. This information is not intended to replace advice given to you by your health care provider. Make sure you discuss any questions you have with your health care provider.

## 2015-03-26 NOTE — Telephone Encounter (Signed)
Please inform patient that his blood work shows that his prostate, kidney function, cholesterol, electrolytes, white/red blood cells, and thyroid function are all within normal limits. Therefore no additional medications are needed at this time. Please have him continue taking the medications as prescribed. Follow up office visit in 6 months or sooner if needed.

## 2015-03-26 NOTE — Assessment & Plan Note (Signed)
1) Anticipatory Guidance: Discussed importance of wearing a seatbelt while driving and not texting while driving; changing batteries in smoke detector at least once annually; wearing suntan lotion when outside; eating a balanced and moderate diet; getting physical activity at least 30 minutes per day.  2) Immunizations / Screenings / Labs:  Tetanus and Zostavax updated today. Prevnar declined but will schedule nurse visit in near future for vaccination. All other immunizations are up-to-date per recommendations. Due for a vision screening. Due for a colonoscopy for which a referral has been placed. All other screenings are up-to-date per recommendations. Obtain CBC, BMET, Lipid profile, PSA and TSH.   Overall well exam. Patient has risk factors for cardiovascular disease including hyperlipidemia and elevated blood pressure readings. Hyperlipidemia is currently well controlled with rosuvastatin. Discussed importance of increasing physical activity to 30 minutes most days of the week to assist with decreasing risk factors for cardiovascular disease and overall health. Continue current healthy lifestyle behaviors. Follow up prevention exam in 1 year. Follow-up office visit pending lab work.

## 2015-03-26 NOTE — Assessment & Plan Note (Signed)
Reviewed and updated patient's medical, surgical, family and social history. Medications and allergies were also reviewed. Basic screenings for depression, activities of daily living, hearing, cognition and safety were performed. Provider list was updated and health plan was provided to the patient.  

## 2015-03-26 NOTE — Progress Notes (Signed)
Pre visit review using our clinic review tool, if applicable. No additional management support is needed unless otherwise documented below in the visit note. 

## 2015-03-26 NOTE — Progress Notes (Signed)
Subjective:    Patient ID: Justin [name], male    DOB: 1945/09/11, 69 y.o.   MRN: [id number]  Chief Complaint  Patient presents with  . CPE    Fasting     HPI:  Justin [name] is a 69 y.o. male who presents today for an annual wellness visit.   1) Health Maintenance -   Diet - Averages about 1-2 meals per day and consisting fruits, vegetable, chicken, pork, limited fish, diary, and meat; 2-3 cups of caffeine per day  Exercise - Working in the garden or being outside; no structured exercise  2) Preventative Exams / Immunizations:  Dental -- Up to date  Vision -- Due for exam    Health Maintenance  Topic Date Due  . COLONOSCOPY  05/19/1996  . ZOSTAVAX  05/19/2006  . PNA vac Low Risk Adult (1 of 2 - PCV13) 05/20/2011  . INFLUENZA VACCINE  03/23/2015  . TETANUS/TDAP  03/25/2025    Immunization History  Administered Date(s) Administered  . Td 03/26/2015  . Zoster 03/26/2015     RISK FACTORS  Tobacco History  Smoking status  . Never Smoker   Smokeless tobacco  . Never Used     Cardiac risk factors: advanced age (older than 2 for men, 56 for women), dyslipidemia, male gender and smoking/ tobacco exposure.  Depression Screen  Q1: Over the past two weeks, have you felt down, depressed or hopeless? No  Q2: Over the past two weeks, have you felt little interest or pleasure in doing things? No  Have you lost interest or pleasure in daily life? No  Do you often feel hopeless? No  Do you cry easily over simple problems? No  Activities of Daily Living In your present state of health, do you have any difficulty performing the following activities?:  Driving? No Managing money?  No Feeding yourself? No Getting from bed to chair? No Climbing a flight of stairs? No Preparing food and eating?: No Bathing or showering? No Getting dressed: No Getting to the toilet? No Using the toilet: No Moving around from place to place: No In the past year have you fallen  or had a near fall?:No   Home Safety Has smoke detector and wears seat belts. No firearms. No excess sun exposure. Are there smokers in your home (other than you)?  No Do you feel safe at home?  Yes  Hearing Difficulties: No Do you often ask people to speak up or repeat themselves? No Do you experience ringing or noises in your ears? No  Do you have difficulty understanding soft or whispered voices? No    Cognitive Testing  Alert? Yes   Normal Appearance? Yes  Oriented to person? Yes  Place? Yes   Time? Yes  Recall of three objects?  Yes  Can perform simple calculations? Yes  Displays appropriate judgment? Yes  Can read the correct time from a watch face? Yes  Do you feel that you have a problem with memory? No  Do you often misplace items? No   Advanced Directives have been discussed with the patient? Yes  Current Physicians/Providers and Suppliers  1. Terri Piedra, FNP - Primary Care  Indicate any recent Medical Services you may have received from other than [facility] providers in the past year (date may be approximate).  All answers were reviewed with the patient and necessary referrals were made:  Mauricio Po, FNP   03/26/2015   Allergies  Allergen Reactions  . Ambien [Zolpidem Tartrate]  Other (See Comments)    Unknown reaction     Outpatient Prescriptions Prior to Visit  Medication Sig Dispense Refill  . amitriptyline (ELAVIL) 50 MG tablet Take 25 mg by mouth daily as needed (back pain).    Marland Kitchen clopidogrel (PLAVIX) 75 MG tablet Take 75 mg by mouth daily.    . clotrimazole-betamethasone (LOTRISONE) cream Apply 1 application topically 2 (two) times daily. 30 g 0  . nitroGLYCERIN (NITROSTAT) 0.3 MG SL tablet Place 0.3 mg under the tongue every 5 (five) minutes as needed for chest pain.    . rosuvastatin (CRESTOR) 20 MG tablet Take 20 mg by mouth daily.    Marland Kitchen ALPRAZolam (XANAX) 0.5 MG tablet Take 1 tablet (0.5 mg total) by mouth 2 (two) times daily as needed. for anxiety 60  tablet 0   No facility-administered medications prior to visit.     Past Medical History  Diagnosis Date  . Asthma   . Arthritis   . Chicken pox   . Depression   . Hyperlipidemia   . Migraines      Past Surgical History  Procedure Laterality Date  . Nasal septum surgery    . Hernia repair    . Neck surgery    . Elbow surgery Left   . Knee surgery Bilateral      Family History  Problem Relation Age of Onset  . Arthritis Mother   . Heart disease Mother   . Hypertension Mother   . Arthritis Father   . Hyperlipidemia Father   . Heart disease Father   . Stroke Father   . Hypertension Father   . Multiple sclerosis Sister   . Diabetes Sister   . Lung cancer Paternal Grandmother   . Diabetes Sister      History   Social History  . Marital Status: Married    Spouse Name: N/A  . Number of Children: 2  . Years of Education: 12   Occupational History  . Retired    Social History Main Topics  . Smoking status: Never Smoker   . Smokeless tobacco: Never Used  . Alcohol Use: No  . Drug Use: No  . Sexual Activity: Not on file   Other Topics Concern  . Not on file   Social History Narrative   Fun: Work out in the yard.   Denies religious beliefs effecting healthcare.       Review of Systems  Constitutional: Denies fever, chills, fatigue, or significant weight gain/loss. HENT: Head: Denies headache or neck pain Ears: Denies changes in hearing, ringing in ears, earache, drainage Nose: Denies discharge, stuffiness, itching, nosebleed, sinus pain Throat: Denies sore throat, hoarseness, dry mouth, sores, thrush Eyes: Denies loss/changes in vision, pain, redness, blurry/double vision, flashing lights Cardiovascular: Denies chest pain/discomfort, tightness, palpitations, shortness of breath with activity, difficulty lying down, swelling, sudden awakening with shortness of breath Respiratory: Denies shortness of breath, cough, sputum production,  wheezing Gastrointestinal: Denies dysphasia, heartburn, change in appetite, nausea, change in bowel habits, rectal bleeding, constipation, diarrhea, yellow skin or eyes Genitourinary: Denies frequency, urgency, burning/pain, blood in urine, incontinence, change in urinary strength. Musculoskeletal: Denies muscle/joint pain, stiffness, back pain, redness or swelling of joints, trauma Skin: Denies rashes, lumps, itching, dryness, color changes, or hair/nail changes Neurological: Denies dizziness, fainting, seizures, weakness, numbness, tingling, tremor Psychiatric - Denies nervousness, stress, depression or memory loss Endocrine: Denies heat or cold intolerance, sweating, frequent urination, excessive thirst, changes in appetite Hematologic: Denies ease of bruising or bleeding  Objective:     BP 134/92 mmHg  Pulse 91  Temp(Src) 98 F (36.7 C) (Oral)  Resp 18  Ht 6' (1.829 m)  Wt 185 lb (83.915 kg)  BMI 25.08 kg/m2  SpO2 98% Nursing note and vital signs reviewed.  Physical Exam  Constitutional: He is oriented to person, place, and time. He appears well-developed and well-nourished.  HENT:  Head: Normocephalic.  Right Ear: Hearing, tympanic membrane, external ear and ear canal normal.  Left Ear: Hearing, tympanic membrane, external ear and ear canal normal.  Nose: Nose normal.  Mouth/Throat: Uvula is midline, oropharynx is clear and moist and mucous membranes are normal.  Eyes: Conjunctivae and EOM are normal. Pupils are equal, round, and reactive to light.  Neck: Neck supple. No JVD present. No tracheal deviation present. No thyromegaly present.  Cardiovascular: Normal rate, regular rhythm, normal heart sounds and intact distal pulses.   Pulmonary/Chest: Effort normal and breath sounds normal.  Abdominal: Soft. Bowel sounds are normal. He exhibits no distension and no mass. There is no tenderness. There is no rebound and no guarding.  Musculoskeletal: Normal range of motion. He  exhibits no edema or tenderness.  Lymphadenopathy:    He has no cervical adenopathy.  Neurological: He is alert and oriented to person, place, and time. He has normal reflexes. No cranial nerve deficit. He exhibits normal muscle tone. Coordination normal.  Skin: Skin is warm and dry.  Psychiatric: He has a normal mood and affect. His behavior is normal. Judgment and thought content normal.       Assessment & Plan:   During the course of the visit the patient was educated and counseled about appropriate screening and preventive services including:    Pneumococcal vaccine   Influenza vaccine  Td vaccine  Prostate cancer screening  Colorectal cancer screening  Glaucoma screening  Nutrition counseling   Advanced directives: Information provided  Diet review for nutrition referral? Yes ____  Not Indicated _X___   Patient Instructions (the written plan) was given to the patient.  Medicare Attestation I have personally reviewed: The patient's medical and social history Their use of alcohol, tobacco or illicit drugs Their current medications and supplements The patient's functional ability including ADLs,fall risks, home safety risks, cognitive, and hearing and visual impairment Diet and physical activities Evidence for depression or mood disorders  The patient's weight, height, BMI,  have been recorded in the chart.  I have made referrals, counseling, and provided education to the patient based on review of the above and I have provided the patient with a written personalized care plan for preventive services.     Mauricio Po, FNP   03/26/2015    Problem List Items Addressed This Visit      Other   Routine general medical examination at a health care facility - Primary    1) Anticipatory Guidance: Discussed importance of wearing a seatbelt while driving and not texting while driving; changing batteries in smoke detector at least once annually; wearing suntan lotion  when outside; eating a balanced and moderate diet; getting physical activity at least 30 minutes per day.  2) Immunizations / Screenings / Labs:  Tetanus and Zostavax updated today. Prevnar declined but will schedule nurse visit in near future for vaccination. All other immunizations are up-to-date per recommendations. Due for a vision screening. Due for a colonoscopy for which a referral has been placed. All other screenings are up-to-date per recommendations. Obtain CBC, BMET, Lipid profile, PSA and TSH.   Overall well  exam. Patient has risk factors for cardiovascular disease including hyperlipidemia and elevated blood pressure readings. Hyperlipidemia is currently well controlled with rosuvastatin. Discussed importance of increasing physical activity to 30 minutes most days of the week to assist with decreasing risk factors for cardiovascular disease and overall health. Continue current healthy lifestyle behaviors. Follow up prevention exam in 1 year. Follow-up office visit pending lab work.       Relevant Orders   Ambulatory referral to Gastroenterology   Basic metabolic panel (Completed)   CBC (Completed)   Lipid panel (Completed)   TSH (Completed)   PSA (Completed)   Medicare annual wellness visit, subsequent    Reviewed and updated patient's medical, surgical, family and social history. Medications and allergies were also reviewed. Basic screenings for depression, activities of daily living, hearing, cognition and safety were performed. Provider list was updated and health plan was provided to the patient.         Other Visit Diagnoses    Need for TD vaccine        Relevant Orders    Td vaccine greater than or equal to 7yo preservative free IM (Completed)    Need for shingles vaccine        Relevant Orders    Varicella-zoster vaccine subcutaneous (Completed)

## 2015-03-30 NOTE — Telephone Encounter (Signed)
LVM for pt to call back.

## 2015-04-01 NOTE — Telephone Encounter (Signed)
Pt aware of results 

## 2015-04-06 ENCOUNTER — Other Ambulatory Visit: Payer: Self-pay | Admitting: Family

## 2015-04-08 ENCOUNTER — Ambulatory Visit: Payer: Self-pay | Admitting: Gastroenterology

## 2015-04-22 ENCOUNTER — Ambulatory Visit: Payer: Self-pay | Admitting: Gastroenterology

## 2015-05-03 ENCOUNTER — Encounter (HOSPITAL_COMMUNITY): Payer: Self-pay

## 2015-05-03 ENCOUNTER — Emergency Department (HOSPITAL_COMMUNITY): Payer: Medicare Other

## 2015-05-03 ENCOUNTER — Emergency Department (HOSPITAL_COMMUNITY)
Admission: EM | Admit: 2015-05-03 | Discharge: 2015-05-03 | Disposition: A | Discharge status: Home / Self Care | Payer: Medicare Other | Attending: Emergency Medicine | Admitting: Emergency Medicine

## 2015-05-03 DIAGNOSIS — Z8619 Personal history of other infectious and parasitic diseases: Secondary | ICD-10-CM | POA: Insufficient documentation

## 2015-05-03 DIAGNOSIS — W28XXXA Contact with powered lawn mower, initial encounter: Secondary | ICD-10-CM | POA: Diagnosis not present

## 2015-05-03 DIAGNOSIS — Y9289 Other specified places as the place of occurrence of the external cause: Secondary | ICD-10-CM | POA: Diagnosis not present

## 2015-05-03 DIAGNOSIS — E785 Hyperlipidemia, unspecified: Secondary | ICD-10-CM | POA: Diagnosis not present

## 2015-05-03 DIAGNOSIS — S299XXA Unspecified injury of thorax, initial encounter: Secondary | ICD-10-CM | POA: Diagnosis not present

## 2015-05-03 DIAGNOSIS — F329 Major depressive disorder, single episode, unspecified: Secondary | ICD-10-CM | POA: Insufficient documentation

## 2015-05-03 DIAGNOSIS — J45909 Unspecified asthma, uncomplicated: Secondary | ICD-10-CM | POA: Diagnosis not present

## 2015-05-03 DIAGNOSIS — Z7902 Long term (current) use of antithrombotics/antiplatelets: Secondary | ICD-10-CM | POA: Insufficient documentation

## 2015-05-03 DIAGNOSIS — Y998 Other external cause status: Secondary | ICD-10-CM | POA: Insufficient documentation

## 2015-05-03 DIAGNOSIS — Z79899 Other long term (current) drug therapy: Secondary | ICD-10-CM | POA: Diagnosis not present

## 2015-05-03 DIAGNOSIS — R0781 Pleurodynia: Secondary | ICD-10-CM

## 2015-05-03 DIAGNOSIS — M1990 Unspecified osteoarthritis, unspecified site: Secondary | ICD-10-CM | POA: Insufficient documentation

## 2015-05-03 DIAGNOSIS — S81811A Laceration without foreign body, right lower leg, initial encounter: Secondary | ICD-10-CM | POA: Insufficient documentation

## 2015-05-03 DIAGNOSIS — G43909 Migraine, unspecified, not intractable, without status migrainosus: Secondary | ICD-10-CM | POA: Insufficient documentation

## 2015-05-03 DIAGNOSIS — Y9389 Activity, other specified: Secondary | ICD-10-CM | POA: Insufficient documentation

## 2015-05-03 LAB — I-STAT TROPONIN, ED: Troponin i, poc: 0 ng/mL (ref 0.00–0.08)

## 2015-05-03 LAB — URINALYSIS, DIPSTICK ONLY
Bilirubin Urine: NEGATIVE
GLUCOSE, UA: NEGATIVE mg/dL
Hgb urine dipstick: NEGATIVE
KETONES UR: NEGATIVE mg/dL
Leukocytes, UA: NEGATIVE
Nitrite: NEGATIVE
PH: 7 (ref 5.0–8.0)
Protein, ur: NEGATIVE mg/dL
Specific Gravity, Urine: 1.012 (ref 1.005–1.030)
Urobilinogen, UA: 0.2 mg/dL (ref 0.0–1.0)

## 2015-05-03 MED ORDER — KETOROLAC TROMETHAMINE 60 MG/2ML IM SOLN
30.0000 mg | Freq: Once | INTRAMUSCULAR | Status: AC
  Administered 2015-05-03: 30 mg via INTRAMUSCULAR
  Filled 2015-05-03: qty 2

## 2015-05-03 MED ORDER — NAPROXEN 500 MG PO TABS: 500.0000 mg | ORAL_TABLET | Freq: Two times a day (BID) | ORAL | Status: DC

## 2015-05-03 MED ORDER — HYDROCODONE-ACETAMINOPHEN 5-325 MG PO TABS: 1.0000 | ORAL_TABLET | Freq: Four times a day (QID) | ORAL | Status: DC | PRN

## 2015-05-03 NOTE — ED Notes (Addendum)
Pt c/o L side ribcage pain w/ palpation x 3 days and RLE laceration last night.  Pain score 8/10.  Pt reports his ribs started hurting after riding on a riding lawn mower x "a while" and leg was cut by a broken picture frame that fell out of a box that he was moving.  Pt is on blood thinners.

## 2015-05-03 NOTE — Discharge Instructions (Signed)
Naproxen for pain and inflammation. Norco for severe pain only. Keep ear laceration clean and dry. Apply bacitracin twice a day. Pressure dressing if started bleeding again. Follow-up with primary care doctor for recheck.  Chest Wall Pain Chest wall pain is pain in or around the bones and muscles of your chest. It may take up to 6 weeks to get better. It may take longer if you must stay physically active in your work and activities.  CAUSES  Chest wall pain may happen on its own. However, it may be caused by:  A viral illness like the flu.  Injury.  Coughing.  Exercise.  Arthritis.  Fibromyalgia.  Shingles. HOME CARE INSTRUCTIONS   Avoid overtiring physical activity. Try not to strain or perform activities that cause pain. This includes any activities using your chest or your abdominal and side muscles, especially if heavy weights are used.  Put ice on the sore area.  Put ice in a plastic bag.  Place a towel between your skin and the bag.  Leave the ice on for 15-20 minutes per hour while awake for the first 2 days.  Only take over-the-counter or prescription medicines for pain, discomfort, or fever as directed by your caregiver. SEEK IMMEDIATE MEDICAL CARE IF:   Your pain increases, or you are very uncomfortable.  You have a fever.  Your chest pain becomes worse.  You have new, unexplained symptoms.  You have nausea or vomiting.  You feel sweaty or lightheaded.  You have a cough with phlegm (sputum), or you cough up blood. MAKE SURE YOU:   Understand these instructions.  Will watch your condition.  Will get help right away if you are not doing well or get worse. Document Released: 08/08/2005 Document Revised: 10/31/2011 Document Reviewed: 04/04/2011 [facility] Patient Information 2015 [place], [place]. This information is not intended to replace advice given to you by your health care provider. Make sure you discuss any questions you have with your health care  provider.  Laceration Care, Adult A laceration is a cut or lesion that goes through all layers of the skin and into the tissue just beneath the skin. TREATMENT  Some lacerations may not require closure. Some lacerations may not be able to be closed due to an increased risk of infection. It is important to see your caregiver as soon as possible after an injury to minimize the risk of infection and maximize the opportunity for successful closure. If closure is appropriate, pain medicines may be given, if needed. The wound will be cleaned to help prevent infection. Your caregiver will use stitches (sutures), staples, wound glue (adhesive), or skin adhesive strips to repair the laceration. These tools bring the skin edges together to allow for faster healing and a better cosmetic outcome. However, all wounds will heal with a scar. Once the wound has healed, scarring can be minimized by covering the wound with sunscreen during the day for 1 full year. HOME CARE INSTRUCTIONS  For sutures or staples:  Keep the wound clean and dry.  If you were given a bandage (dressing), you should change it at least once a day. Also, change the dressing if it becomes wet or dirty, or as directed by your caregiver.  Wash the wound with soap and water 2 times a day. Rinse the wound off with water to remove all soap. Pat the wound dry with a clean towel.  After cleaning, apply a thin layer of the antibiotic ointment as recommended by your caregiver. This will help  prevent infection and keep the dressing from sticking.  You may shower as usual after the first 24 hours. Do not soak the wound in water until the sutures are removed.  Only take over-the-counter or prescription medicines for pain, discomfort, or fever as directed by your caregiver.  Get your sutures or staples removed as directed by your caregiver. For skin adhesive strips:  Keep the wound clean and dry.  Do not get the skin adhesive strips wet. You may  bathe carefully, using caution to keep the wound dry.  If the wound gets wet, pat it dry with a clean towel.  Skin adhesive strips will fall off on their own. You may trim the strips as the wound heals. Do not remove skin adhesive strips that are still stuck to the wound. They will fall off in time. For wound adhesive:  You may briefly wet your wound in the shower or bath. Do not soak or scrub the wound. Do not swim. Avoid periods of heavy perspiration until the skin adhesive has fallen off on its own. After showering or bathing, gently pat the wound dry with a clean towel.  Do not apply liquid medicine, cream medicine, or ointment medicine to your wound while the skin adhesive is in place. This may loosen the film before your wound is healed.  If a dressing is placed over the wound, be careful not to apply tape directly over the skin adhesive. This may cause the adhesive to be pulled off before the wound is healed.  Avoid prolonged exposure to sunlight or tanning lamps while the skin adhesive is in place. Exposure to ultraviolet light in the first year will darken the scar.  The skin adhesive will usually remain in place for 5 to 10 days, then naturally fall off the skin. Do not pick at the adhesive film. You may need a tetanus shot if:  You cannot remember when you had your last tetanus shot.  You have never had a tetanus shot. If you get a tetanus shot, your arm may swell, get red, and feel warm to the touch. This is common and not a problem. If you need a tetanus shot and you choose not to have one, there is a rare chance of getting tetanus. Sickness from tetanus can be serious. SEEK MEDICAL CARE IF:   You have redness, swelling, or increasing pain in the wound.  You see a red line that goes away from the wound.  You have yellowish-white fluid (pus) coming from the wound.  You have a fever.  You notice a bad smell coming from the wound or dressing.  Your wound breaks open before  or after sutures have been removed.  You notice something coming out of the wound such as wood or glass.  Your wound is on your hand or foot and you cannot move a finger or toe. SEEK IMMEDIATE MEDICAL CARE IF:   Your pain is not controlled with prescribed medicine.  You have severe swelling around the wound causing pain and numbness or a change in color in your arm, hand, leg, or foot.  Your wound splits open and starts bleeding.  You have worsening numbness, weakness, or loss of function of any joint around or beyond the wound.  You develop painful lumps near the wound or on the skin anywhere on your body. MAKE SURE YOU:   Understand these instructions.  Will watch your condition.  Will get help right away if you are not doing well or  get worse. Document Released: 08/08/2005 Document Revised: 10/31/2011 Document Reviewed: 02/01/2011 [facility] Patient Information 2015 [place], [place]. This information is not intended to replace advice given to you by your health care provider. Make sure you discuss any questions you have with your health care provider.

## 2015-05-03 NOTE — ED Provider Notes (Signed)
CSN: [id number]     Arrival date & time 05/03/15  1147 History   This chart was scribed for non-physician practitioner Jeannett Senior, PA-C working with Forde Dandy, MD by Meriel Pica, ED Scribe. This patient was seen in room WTR8/WTR8 and the patient's care was started at 12:53 PM.   Chief Complaint  Patient presents with  . Ribcage Pain   . Extremity Laceration   The history is provided by the patient. No language interpreter was used.   HPI Comments: Justin [name] is a 69 y.o. male, with a significant PMhx, who presents to the Emergency Department complaining of sudden onset, constant, moderate left-sided rib cage pain onset 3 days ago. Pt reports his pain came on after he was riding a Conservation [employer], nature on Thursday for 3 hours. Pt notes a history of scoliosis and degenerative discs for which he gets steroid injections in his back. His rib pain is exacerbated with taking a deep breath or moving around. The pt has taken Advil with no relief. He also reports while lifting a box of picture frames, one of the glass frames fell out of the box and cut his right, lower, lateral leg onset 1 day ago. Pt is on Plavix 75mg  daily s/p MI 3 years ago. He denies his current left-sided rib cage pain to feel like PMhx of heart attack. Tetanus UTD. He denies SOB, coughing, abdominal pain, nausea, vomiting, or diarrhea.   Past Medical History  Diagnosis Date  . Asthma   . Arthritis   . Chicken pox   . Depression   . Hyperlipidemia   . Migraines    Past Surgical History  Procedure Laterality Date  . Nasal septum surgery    . Hernia repair    . Neck surgery    . Elbow surgery Left   . Knee surgery Bilateral    Family History  Problem Relation Age of Onset  . Arthritis Mother   . Heart disease Mother   . Hypertension Mother   . Arthritis Father   . Hyperlipidemia Father   . Heart disease Father   . Stroke Father   . Hypertension Father   . Multiple sclerosis Sister   . Diabetes Sister   .  Lung cancer Paternal Grandmother   . Diabetes Sister    Social History  Substance Use Topics  . Smoking status: Never Smoker   . Smokeless tobacco: Never Used  . Alcohol Use: No    Review of Systems  Respiratory: Negative for cough and shortness of breath.   Gastrointestinal: Negative for nausea, vomiting, abdominal pain and diarrhea.  Musculoskeletal: Positive for arthralgias ( left-sided rib cage ).  Skin: Positive for wound (laceration right leg ).   Allergies  Ambien  Home Medications   Prior to Admission medications   Medication Sig Start Date End Date Taking? Authorizing Provider  ALPRAZolam Duanne Moron) 0.5 MG tablet Take 1 tablet (0.5 mg total) by mouth 2 (two) times daily as needed. for anxiety 03/26/15   Golden Circle, FNP  amitriptyline (ELAVIL) 50 MG tablet Take 25 mg by mouth daily as needed (back pain).    Historical Provider, MD  clopidogrel (PLAVIX) 75 MG tablet Take 75 mg by mouth daily.    Historical Provider, MD  clotrimazole-betamethasone (LOTRISONE) cream APPLY ONE CREAM TOPICALLY TWICE DAILY 04/06/15   Golden Circle, FNP  nitroGLYCERIN (NITROSTAT) 0.3 MG SL tablet Place 0.3 mg under the tongue every 5 (five) minutes as needed for chest pain.  Historical Provider, MD  rosuvastatin (CRESTOR) 20 MG tablet Take 20 mg by mouth daily.    Historical Provider, MD   BP 143/87 mmHg  Pulse 88  Temp(Src) 97.9 F (36.6 C) (Oral)  Resp 16  SpO2 99% Physical Exam  Constitutional: He is oriented to person, place, and time. He appears well-developed and well-nourished. No distress.  HENT:  Head: Normocephalic.  Eyes: Conjunctivae are normal.  Neck: No JVD present.  Cardiovascular: Normal rate, regular rhythm and normal heart sounds.   Pulmonary/Chest: Effort normal and breath sounds normal. No respiratory distress. He has no wheezes. He has no rales. He exhibits tenderness.  ttp over left lower ribs. No rash noted. No swelling, erythema, bruising, deformity   Abdominal: Soft. Bowel sounds are normal. He exhibits no distension. There is no tenderness. There is no rebound.  Neurological: He is alert and oriented to person, place, and time. Coordination normal.  Skin: Skin is warm. No rash noted. No erythema. No pallor.  1cm laceration to lateral, proximal, right shin that is hemastatic at this time.   Psychiatric: He has a normal mood and affect. His behavior is normal.  Nursing note and vitals reviewed.   ED Course  Procedures  DIAGNOSTIC STUDIES: Oxygen Saturation is 99% on RA, normal by my interpretation.    COORDINATION OF CARE: 1:00 PM Discussed treatment plan which includes to order Xray of right tib/fib and chest with pt. Pt acknowledges and agrees to plan.  2:05 PM Discussed Xray results with pt. Consulted with Dr. Oleta Mouse; will order Istat troponin and EKG. Pt acknowledges and agrees to plan.  2:57 PM Discussed normal Istat and EKG with pt.    Labs Review Labs Reviewed  URINALYSIS, DIPSTICK ONLY  I-STAT TROPOININ, ED    Imaging Review Dg Chest 2 View  05/03/2015   CLINICAL DATA:  Left-sided flank pain beginning 04/30/2015 after mowing the yard.  EXAM: CHEST  2 VIEW  COMPARISON:  12/05/2013  FINDINGS: Cardiomediastinal silhouette is unchanged and within normal limits. Lungs remain hyperinflated. No confluent airspace opacity, edema, pleural effusion, or pneumothorax is seen. No acute osseous abnormality is seen.  IMPRESSION: No active cardiopulmonary disease.   Electronically Signed   By: Logan Bores M.D.   On: 05/03/2015 13:54   Dg Tibia/fibula Right  05/03/2015   CLINICAL DATA:  Cut mid lateral side of right lower leg with a broken picture frame on 05/02/2015. Evaluation for foreign body. Initial encounter.  EXAM: RIGHT TIBIA AND FIBULA - 2 VIEW  COMPARISON:  None.  FINDINGS: The tibia and fibula are intact without fracture identified. The knee and ankle are located. There may be a very small knee joint effusion. No radiopaque foreign  body is seen. Scattered vascular calcifications are noted.  IMPRESSION: No acute osseous abnormality or radiopaque foreign body identified.   Electronically Signed   By: Logan Bores M.D.   On: 05/03/2015 13:56   I have personally reviewed and evaluated these images and lab results as part of my medical decision-making.   MDM   Final diagnoses:  Rib pain on left side  Laceration of right lower leg, initial encounter   Patient with left lower rib cage pain. He believes he injured it while riding a riding lawnmower.Pain is over the flank area. UA obtained and is negative. EKG and troponin obtained both negative. Vital signs are normal. Patient is not hypoxic, not tachycardic, doubt PE. Pain is clearly the produced with palpation of the ribs and with movement. Most likely  muscular strain.Chest x-ray is negative. Will treat with NSAIDs and Norco. Patient also has a small laceration to the right lower leg that happened yesterday. No active bleeding. Wound cleaned by RN. I did obtain -ray of tib-fib to rule out foreign bodies and is negative. Discussed wound care at home, which includes washing it was supple water Bacitracin, pressure dressing of bleeding again. Discussed with Dr.Liu, agrees with the plan.   Filed Vitals:   05/03/15 1203  BP: 143/87  Pulse: 88  Temp: 97.9 F (36.6 C)  TempSrc: Oral  Resp: 16  SpO2: 99%    I personally performed the services described in this documentation, which was scribed in my presence. The recorded information has been reviewed and is accurate.   Jeannett Senior, PA-C 05/03/15 [place] Liu, MD 05/03/15 Einar Crow

## 2015-05-04 ENCOUNTER — Telehealth: Payer: Self-pay | Admitting: *Deleted

## 2015-05-04 NOTE — Telephone Encounter (Signed)
Pt left msg on triage requesting refill on his xanax...Justin [name]

## 2015-05-05 MED ORDER — ALPRAZOLAM 0.5 MG PO TABS: 0.5000 mg | ORAL_TABLET | Freq: Two times a day (BID) | ORAL | Status: DC | PRN

## 2015-05-05 NOTE — Telephone Encounter (Signed)
Medication printed and faxed to pharmacy.

## 2015-06-02 ENCOUNTER — Telehealth: Payer: Self-pay | Admitting: Family

## 2015-06-02 MED ORDER — ALPRAZOLAM 0.5 MG PO TABS: 0.5000 mg | ORAL_TABLET | Freq: Two times a day (BID) | ORAL | Status: DC | PRN

## 2015-06-02 NOTE — Telephone Encounter (Signed)
Rx faxed

## 2015-06-02 NOTE — Telephone Encounter (Signed)
Patient requesting refill for ALPRAZolam Duanne Moron) 0.5 MG tablet [[id number] Pharmacy is [facility] on [address] in [place]

## 2015-06-02 NOTE — Telephone Encounter (Signed)
Medication printed and refilled.

## 2015-06-02 NOTE — Telephone Encounter (Signed)
Last refill was 9/13. Please advise

## 2015-06-26 ENCOUNTER — Telehealth: Payer: Self-pay

## 2015-06-26 ENCOUNTER — Telehealth: Payer: Self-pay | Admitting: Family

## 2015-06-26 ENCOUNTER — Encounter: Payer: Self-pay | Admitting: Family

## 2015-06-26 ENCOUNTER — Ambulatory Visit (INDEPENDENT_AMBULATORY_CARE_PROVIDER_SITE_OTHER): Payer: Medicare Other | Admitting: Family

## 2015-06-26 VITALS — BP 138/84 | HR 77 | Temp 97.7°F | Resp 18 | Wt 196.0 lb

## 2015-06-26 DIAGNOSIS — Z23 Encounter for immunization: Secondary | ICD-10-CM | POA: Diagnosis not present

## 2015-06-26 DIAGNOSIS — F319 Bipolar disorder, unspecified: Secondary | ICD-10-CM

## 2015-06-26 DIAGNOSIS — M545 Low back pain, unspecified: Secondary | ICD-10-CM

## 2015-06-26 MED ORDER — TIZANIDINE HCL 4 MG PO TABS: 4.0000 mg | ORAL_TABLET | Freq: Four times a day (QID) | ORAL | Status: DC | PRN

## 2015-06-26 MED ORDER — TRAZODONE HCL 50 MG PO TABS: 50.0000 mg | ORAL_TABLET | Freq: Every evening | ORAL | Status: DC | PRN

## 2015-06-26 MED ORDER — METHOCARBAMOL 500 MG PO TABS: 500.0000 mg | ORAL_TABLET | Freq: Three times a day (TID) | ORAL | Status: DC | PRN

## 2015-06-26 MED ORDER — NAPROXEN 500 MG PO TABS: 500.0000 mg | ORAL_TABLET | Freq: Two times a day (BID) | ORAL | Status: DC

## 2015-06-26 MED ORDER — LAMOTRIGINE 25 MG PO TABS: ORAL_TABLET | ORAL | Status: DC

## 2015-06-26 MED ORDER — DOXEPIN HCL 3 MG PO TABS: 3.0000 mg | ORAL_TABLET | Freq: Every evening | ORAL | Status: DC | PRN

## 2015-06-26 NOTE — Assessment & Plan Note (Signed)
Previously diagnosed with bipolar disorder not currently taking any medications. Appears to be in a mixed state at this time with some mania and underlying depression. Start Lamictal. Start doxepin as needed for sleep. Refer to psychiatry for further management of bipolar. Follow-up in one month or sooner for worsening symptoms.

## 2015-06-26 NOTE — Patient Instructions (Addendum)
Thank you for choosing [facility].  Summary/Instructions:  Your prescription(s) have been submitted to your pharmacy or been printed and provided for you. Please take as directed and contact our office if you believe you are having problem(s) with the medication(s) or have any questions.  If your symptoms worsen or fail to improve, please contact our office for further instruction, or in case of emergency go directly to the emergency room at the closest medical facility.   Low Back Strain With Rehab A strain is an injury in which a tendon or muscle is torn. The muscles and tendons of the lower back are vulnerable to strains. However, these muscles and tendons are very strong and require a great force to be injured. Strains are classified into three categories. Grade 1 strains cause pain, but the tendon is not lengthened. Grade 2 strains include a lengthened ligament, due to the ligament being stretched or partially ruptured. With grade 2 strains there is still function, although the function may be decreased. Grade 3 strains involve a complete tear of the tendon or muscle, and function is usually impaired. SYMPTOMS   Pain in the lower back.  Pain that affects one side more than the other.  Pain that gets worse with movement and may be felt in the hip, buttocks, or back of the thigh.  Muscle spasms of the muscles in the back.  Swelling along the muscles of the back.  Loss of strength of the back muscles.  Crackling sound (crepitation) when the muscles are touched. CAUSES  Lower back strains occur when a force is placed on the muscles or tendons that is greater than they can handle. Common causes of injury include:  Prolonged overuse of the muscle-tendon units in the lower back, usually from incorrect posture.  A single violent injury or force applied to the back. RISK INCREASES WITH:  Sports that involve twisting forces on the spine or a lot of bending at the waist (football,  rugby, weightlifting, bowling, golf, tennis, speed skating, racquetball, swimming, running, gymnastics, diving).  Poor strength and flexibility.  Failure to warm up properly before activity.  Family history of lower back pain or disk disorders.  Previous back injury or surgery (especially fusion).  Poor posture with lifting, especially heavy objects.  Prolonged sitting, especially with poor posture. PREVENTION   Learn and use proper posture when sitting or lifting (maintain proper posture when sitting, lift using the knees and legs, not at the waist).  Warm up and stretch properly before activity.  Allow for adequate recovery between workouts.  Maintain physical fitness:  Strength, flexibility, and endurance.  Cardiovascular fitness. PROGNOSIS  If treated properly, lower back strains usually heal within 6 weeks. RELATED COMPLICATIONS   Recurring symptoms, resulting in a chronic problem.  Chronic inflammation, scarring, and partial muscle-tendon tear.  Delayed healing or resolution of symptoms.  Prolonged disability. TREATMENT  Treatment first involves the use of ice and medicine, to reduce pain and inflammation. The use of strengthening and stretching exercises may help reduce pain with activity. These exercises may be performed at home or with a therapist. Severe injuries may require referral to a therapist for further evaluation and treatment, such as ultrasound. Your caregiver may advise that you wear a back brace or corset, to help reduce pain and discomfort. Often, prolonged bed rest results in greater harm then benefit. Corticosteroid injections may be recommended. However, these should be reserved for the most serious cases. It is important to avoid using your back when   lifting objects. At night, sleep on your back on a firm mattress with a pillow placed under your knees. If non-surgical treatment is unsuccessful, surgery may be needed.  MEDICATION   If pain medicine  is needed, nonsteroidal anti-inflammatory medicines (aspirin and ibuprofen), or other minor pain relievers (acetaminophen), are often advised.  Do not take pain medicine for 7 days before surgery.  Prescription pain relievers may be given, if your caregiver thinks they are needed. Use only as directed and only as much as you need.  Ointments applied to the skin may be helpful.  Corticosteroid injections may be given by your caregiver. These injections should be reserved for the most serious cases, because they may only be given a certain number of times. HEAT AND COLD  Cold treatment (icing) should be applied for 10 to 15 minutes every 2 to 3 hours for inflammation and pain, and immediately after activity that aggravates your symptoms. Use ice packs or an ice massage.  Heat treatment may be used before performing stretching and strengthening activities prescribed by your caregiver, physical therapist, or athletic trainer. Use a heat pack or a warm water soak. SEEK MEDICAL CARE IF:   Symptoms get worse or do not improve in 2 to 4 weeks, despite treatment.  You develop numbness, weakness, or loss of bowel or bladder function.  New, unexplained symptoms develop. (Drugs used in treatment may produce side effects.) EXERCISES  RANGE OF MOTION (ROM) AND STRETCHING EXERCISES - Low Back Strain Most people with lower back pain will find that their symptoms get worse with excessive bending forward (flexion) or arching at the lower back (extension). The exercises which will help resolve your symptoms will focus on the opposite motion.  Your physician, physical therapist or athletic trainer will help you determine which exercises will be most helpful to resolve your lower back pain. Do not complete any exercises without first consulting with your caregiver. Discontinue any exercises which make your symptoms worse until you speak to your caregiver.  If you have pain, numbness or tingling which travels  down into your buttocks, leg or foot, the goal of the therapy is for these symptoms to move closer to your back and eventually resolve. Sometimes, these leg symptoms will get better, but your lower back pain may worsen. This is typically an indication of progress in your rehabilitation. Be very alert to any changes in your symptoms and the activities in which you participated in the 24 hours prior to the change. Sharing this information with your caregiver will allow him/her to most efficiently treat your condition.  These exercises may help you when beginning to rehabilitate your injury. Your symptoms may resolve with or without further involvement from your physician, physical therapist or athletic trainer. While completing these exercises, remember:  Restoring tissue flexibility helps normal motion to return to the joints. This allows healthier, less painful movement and activity.  An effective stretch should be held for at least 30 seconds.  A stretch should never be painful. You should only feel a gentle lengthening or release in the stretched tissue. FLEXION RANGE OF MOTION AND STRETCHING EXERCISES: STRETCH - Flexion, Single Knee to Chest   Lie on a firm bed or floor with both legs extended in front of you.  Keeping one leg in contact with the floor, bring your opposite knee to your chest. Hold your leg in place by either grabbing behind your thigh or at your knee.  Pull until you feel a gentle stretch in your  lower back. Hold __________ seconds.  Slowly release your grasp and repeat the exercise with the opposite side. Repeat __________ times. Complete this exercise __________ times per day.  STRETCH - Flexion, Double Knee to Chest   Lie on a firm bed or floor with both legs extended in front of you.  Keeping one leg in contact with the floor, bring your opposite knee to your chest.  Tense your stomach muscles to support your back and then lift your other knee to your chest. Hold  your legs in place by either grabbing behind your thighs or at your knees.  Pull both knees toward your chest until you feel a gentle stretch in your lower back. Hold __________ seconds.  Tense your stomach muscles and slowly return one leg at a time to the floor. Repeat __________ times. Complete this exercise __________ times per day.  STRETCH - Low Trunk Rotation  Lie on a firm bed or floor. Keeping your legs in front of you, bend your knees so they are both pointed toward the ceiling and your feet are flat on the floor.  Extend your arms out to the side. This will stabilize your upper body by keeping your shoulders in contact with the floor.  Gently and slowly drop both knees together to one side until you feel a gentle stretch in your lower back. Hold for __________ seconds.  Tense your stomach muscles to support your lower back as you bring your knees back to the starting position. Repeat the exercise to the other side. Repeat __________ times. Complete this exercise __________ times per day  EXTENSION RANGE OF MOTION AND FLEXIBILITY EXERCISES: STRETCH - Extension, Prone on Elbows   Lie on your stomach on the floor, a bed will be too soft. Place your palms about shoulder width apart and at the height of your head.  Place your elbows under your shoulders. If this is too painful, stack pillows under your chest.  Allow your body to relax so that your hips drop lower and make contact more completely with the floor.  Hold this position for __________ seconds.  Slowly return to lying flat on the floor. Repeat __________ times. Complete this exercise __________ times per day.  RANGE OF MOTION - Extension, Prone Press Ups  Lie on your stomach on the floor, a bed will be too soft. Place your palms about shoulder width apart and at the height of your head.  Keeping your back as relaxed as possible, slowly straighten your elbows while keeping your hips on the floor. You may adjust the  placement of your hands to maximize your comfort. As you gain motion, your hands will come more underneath your shoulders.  Hold this position __________ seconds.  Slowly return to lying flat on the floor. Repeat __________ times. Complete this exercise __________ times per day.  RANGE OF MOTION- Quadruped, Neutral Spine   Assume a hands and knees position on a firm surface. Keep your hands under your shoulders and your knees under your hips. You may place padding under your knees for comfort.  Drop your head and point your tail bone toward the ground below you. This will round out your lower back like an angry cat. Hold this position for __________ seconds.  Slowly lift your head and release your tail bone so that your back sags into a large arch, like an old horse.  Hold this position for __________ seconds.  Repeat this until you feel limber in your lower back.  Now,  find your "sweet spot." This will be the most comfortable position somewhere between the two previous positions. This is your neutral spine. Once you have found this position, tense your stomach muscles to support your lower back.  Hold this position for __________ seconds. Repeat __________ times. Complete this exercise __________ times per day.  STRENGTHENING EXERCISES - Low Back Strain These exercises may help you when beginning to rehabilitate your injury. These exercises should be done near your "sweet spot." This is the neutral, low-back arch, somewhere between fully rounded and fully arched, that is your least painful position. When performed in this safe range of motion, these exercises can be used for people who have either a flexion or extension based injury. These exercises may resolve your symptoms with or without further involvement from your physician, physical therapist or athletic trainer. While completing these exercises, remember:   Muscles can gain both the endurance and the strength needed for everyday  activities through controlled exercises.  Complete these exercises as instructed by your physician, physical therapist or athletic trainer. Increase the resistance and repetitions only as guided.  You may experience muscle soreness or fatigue, but the pain or discomfort you are trying to eliminate should never worsen during these exercises. If this pain does worsen, stop and make certain you are following the directions exactly. If the pain is still present after adjustments, discontinue the exercise until you can discuss the trouble with your caregiver. STRENGTHENING - Deep Abdominals, Pelvic Tilt  Lie on a firm bed or floor. Keeping your legs in front of you, bend your knees so they are both pointed toward the ceiling and your feet are flat on the floor.  Tense your lower abdominal muscles to press your lower back into the floor. This motion will rotate your pelvis so that your tail bone is scooping upwards rather than pointing at your feet or into the floor.  With a gentle tension and even breathing, hold this position for __________ seconds. Repeat __________ times. Complete this exercise __________ times per day.  STRENGTHENING - Abdominals, Crunches   Lie on a firm bed or floor. Keeping your legs in front of you, bend your knees so they are both pointed toward the ceiling and your feet are flat on the floor. Cross your arms over your chest.  Slightly tip your chin down without bending your neck.  Tense your abdominals and slowly lift your trunk high enough to just clear your shoulder blades. Lifting higher can put excessive stress on the lower back and does not further strengthen your abdominal muscles.  Control your return to the starting position. Repeat __________ times. Complete this exercise __________ times per day.  STRENGTHENING - Quadruped, Opposite UE/LE Lift   Assume a hands and knees position on a firm surface. Keep your hands under your shoulders and your knees under your  hips. You may place padding under your knees for comfort.  Find your neutral spine and gently tense your abdominal muscles so that you can maintain this position. Your shoulders and hips should form a rectangle that is parallel with the floor and is not twisted.  Keeping your trunk steady, lift your right hand no higher than your shoulder and then your left leg no higher than your hip. Make sure you are not holding your breath. Hold this position __________ seconds.  Continuing to keep your abdominal muscles tense and your back steady, slowly return to your starting position. Repeat with the opposite arm and leg. Repeat __________   times. Complete this exercise __________ times per day.  STRENGTHENING - Lower Abdominals, Double Knee Lift  Lie on a firm bed or floor. Keeping your legs in front of you, bend your knees so they are both pointed toward the ceiling and your feet are flat on the floor.  Tense your abdominal muscles to brace your lower back and slowly lift both of your knees until they come over your hips. Be certain not to hold your breath.  Hold __________ seconds. Using your abdominal muscles, return to the starting position in a slow and controlled manner. Repeat __________ times. Complete this exercise __________ times per day.  POSTURE AND BODY MECHANICS CONSIDERATIONS - Low Back Strain Keeping correct posture when sitting, standing or completing your activities will reduce the stress put on different body tissues, allowing injured tissues a chance to heal and limiting painful experiences. The following are general guidelines for improved posture. Your physician or physical therapist will provide you with any instructions specific to your needs. While reading these guidelines, remember:  The exercises prescribed by your provider will help you have the flexibility and strength to maintain correct postures.  The correct posture provides the best environment for your joints to work.  All of your joints have less wear and tear when properly supported by a spine with good posture. This means you will experience a healthier, less painful body.  Correct posture must be practiced with all of your activities, especially prolonged sitting and standing. Correct posture is as important when doing repetitive low-stress activities (typing) as it is when doing a single heavy-load activity (lifting). RESTING POSITIONS Consider which positions are most painful for you when choosing a resting position. If you have pain with flexion-based activities (sitting, bending, stooping, squatting), choose a position that allows you to rest in a less flexed posture. You would want to avoid curling into a fetal position on your side. If your pain worsens with extension-based activities (prolonged standing, working overhead), avoid resting in an extended position such as sleeping on your stomach. Most people will find more comfort when they rest with their spine in a more neutral position, neither too rounded nor too arched. Lying on a non-sagging bed on your side with a pillow between your knees, or on your back with a pillow under your knees will often provide some relief. Keep in mind, being in any one position for a prolonged period of time, no matter how correct your posture, can still lead to stiffness. PROPER SITTING POSTURE In order to minimize stress and discomfort on your spine, you must sit with correct posture. Sitting with good posture should be effortless for a healthy body. Returning to good posture is a gradual process. Many people can work toward this most comfortably by using various supports until they have the flexibility and strength to maintain this posture on their own. When sitting with proper posture, your ears will fall over your shoulders and your shoulders will fall over your hips. You should use the back of the chair to support your upper back. Your lower back will be in a neutral  position, just slightly arched. You may place a small pillow or folded towel at the base of your lower back for support.  When working at a desk, create an environment that supports good, upright posture. Without extra support, muscles tire, which leads to excessive strain on joints and other tissues. Keep these recommendations in mind: CHAIR:  A chair should be able to slide under your   desk when your back makes contact with the back of the chair. This allows you to work closely.  The chair's height should allow your eyes to be level with the upper part of your monitor and your hands to be slightly lower than your elbows. BODY POSITION  Your feet should make contact with the floor. If this is not possible, use a foot rest.  Keep your ears over your shoulders. This will reduce stress on your neck and lower back. INCORRECT SITTING POSTURES  If you are feeling tired and unable to assume a healthy sitting posture, do not slouch or slump. This puts excessive strain on your back tissues, causing more damage and pain. Healthier options include:  Using more support, like a lumbar pillow.  Switching tasks to something that requires you to be upright or walking.  Talking a brief walk.  Lying down to rest in a neutral-spine position. PROLONGED STANDING WHILE SLIGHTLY LEANING FORWARD  When completing a task that requires you to lean forward while standing in one place for a long time, place either foot up on a stationary 2-4 inch high object to help maintain the best posture. When both feet are on the ground, the lower back tends to lose its slight inward curve. If this curve flattens (or becomes too large), then the back and your other joints will experience too much stress, tire more quickly, and can cause pain. CORRECT STANDING POSTURES Proper standing posture should be assumed with all daily activities, even if they only take a few moments, like when brushing your teeth. As in sitting, your ears  should fall over your shoulders and your shoulders should fall over your hips. You should keep a slight tension in your abdominal muscles to brace your spine. Your tailbone should point down to the ground, not behind your body, resulting in an over-extended swayback posture.  INCORRECT STANDING POSTURES  Common incorrect standing postures include a forward head, locked knees and/or an excessive swayback. WALKING Walk with an upright posture. Your ears, shoulders and hips should all line-up. PROLONGED ACTIVITY IN A FLEXED POSITION When completing a task that requires you to bend forward at your waist or lean over a low surface, try to find a way to stabilize 3 out of 4 of your limbs. You can place a hand or elbow on your thigh or rest a knee on the surface you are reaching across. This will provide you more stability so that your muscles do not fatigue as quickly. By keeping your knees relaxed, or slightly bent, you will also reduce stress across your lower back. CORRECT LIFTING TECHNIQUES DO :   Assume a wide stance. This will provide you more stability and the opportunity to get as close as possible to the object which you are lifting.  Tense your abdominals to brace your spine. Bend at the knees and hips. Keeping your back locked in a neutral-spine position, lift using your leg muscles. Lift with your legs, keeping your back straight.  Test the weight of unknown objects before attempting to lift them. Try to keep your elbows locked down at your sides in order get the best strength   Lamotrigine tablets What is this medicine? LAMOTRIGINE (la MOE Hendricks Limes) is used to control seizures in adults and children with epilepsy and Lennox-Gastaut syndrome. It is also used in adults to treat bipolar disorder. This medicine may be used for other purposes; ask your health care provider or pharmacist if you have questions. What should I  tell my health care provider before I take this medicine? They need to  know if you have any of these conditions: -a history of depression or bipolar disorder -aseptic meningitis during prior use of lamotrigine -folate deficiency -kidney disease -liver disease -suicidal thoughts, plans, or attempt; a previous suicide attempt by you or a family member -an unusual or allergic reaction to lamotrigine or other seizure medications, other medicines, foods, dyes, or preservatives -pregnant or trying to get pregnant -breast-feeding How should I use this medicine? Take this medicine by mouth with a glass of water. Follow the directions on the prescription label. Do not chew these tablets. If this medicine upsets your stomach, take it with food or milk. Take your doses at regular intervals. Do not take your medicine more often than directed. A special MedGuide will be given to you by the pharmacist with each new prescription and refill. Be sure to read this information carefully each time. Talk to your pediatrician regarding the use of this medicine in children. While this drug may be prescribed for children as young as 2 years for selected conditions, precautions do apply. Overdosage: If you think you have taken too much of this medicine contact a poison control center or emergency room at once. NOTE: This medicine is only for you. Do not share this medicine with others. What if I miss a dose? If you miss a dose, take it as soon as you can. If it is almost time for your next dose, take only that dose. Do not take double or extra doses. What may interact with this medicine? -carbamazepine -male hormones, including contraceptive or birth control pills -methotrexate -phenobarbital -phenytoin -primidone -pyrimethamine -rifampin -trimethoprim -valproic acid This list may not describe all possible interactions. Give your health care provider a list of all the medicines, herbs, non-prescription drugs, or dietary supplements you use. Also tell them if you smoke, drink  alcohol, or use illegal drugs. Some items may interact with your medicine. What should I watch for while using this medicine? Visit your doctor or health care professional for regular checks on your progress. If you take this medicine for seizures, wear a Medic Alert bracelet or necklace. Carry an identification card with information about your condition, medicines, and doctor or health care professional. It is important to take this medicine exactly as directed. When first starting treatment, your dose will need to be adjusted slowly. It may take weeks or months before your dose is stable. You should contact your doctor or health care professional if your seizures get worse or if you have any new types of seizures. Do not stop taking this medicine unless instructed by your doctor or health care professional. Stopping your medicine suddenly can increase your seizures or their severity. Contact your doctor or health care professional right away if you develop a rash while taking this medicine. Rashes may be very severe and sometimes require treatment in the hospital. Deaths from rashes have occurred. Serious rashes occur more often in children than adults taking this medicine. It is more common for these serious rashes to occur during the first 2 months of treatment, but a rash can occur at any time. You may get drowsy, dizzy, or have blurred vision. Do not drive, use machinery, or do anything that needs mental alertness until you know how this medicine affects you. To reduce dizzy or fainting spells, do not sit or stand up quickly, especially if you are an older patient. Alcohol can increase drowsiness and dizziness. Avoid  alcoholic drinks. If you are taking this medicine for bipolar disorder, it is important to report any changes in your mood to your doctor or health care professional. If your condition gets worse, you get mentally depressed, feel very hyperactive or manic, have difficulty sleeping, or have  thoughts of hurting yourself or committing suicide, you need to get help from your health care professional right away. If you are a caregiver for someone taking this medicine for bipolar disorder, you should also report these behavioral changes right away. The use of this medicine may increase the chance of suicidal thoughts or actions. Pay special attention to how you are responding while on this medicine. Your mouth may get dry. Chewing sugarless gum or sucking hard candy, and drinking plenty of water may help. Contact your doctor if the problem does not go away or is severe. Women who become pregnant while using this medicine may enroll in the [place] Pregnancy Registry by calling [phone]. This registry collects information about the safety of antiepileptic drug use during pregnancy. What side effects may I notice from receiving this medicine? Side effects you should report to your doctor or health care professional as soon as possible: -allergic reactions like skin rash, itching or hives, swelling of the face, lips, or tongue -blurred or double vision -difficulty walking or controlling muscle movements -fever -headache, stiff neck, and sensitivity to light -painful sores in the mouth, eyes, or nose -redness, blistering, peeling or loosening of the skin, including inside the mouth -severe muscle pain -swollen lymph glands -uncontrollable eye movements -unusual bruising or bleeding -unusually weak or tired -vomiting -worsening of mood, thoughts or actions of suicide or dying -yellowing of the eyes or skin Side effects that usually do not require medical attention (report to your doctor or health care professional if they continue or are bothersome): -diarrhea or constipation -difficulty sleeping -nausea -tremors This list may not describe all possible side effects. Call your doctor for medical advice about side effects. You may report side effects to FDA  at 1-800-FDA-1088. Where should I keep my medicine? Keep out of reach of children. Store at room temperature between 15 and 30 degrees C (59 and 86 degrees F). Throw away any unused medicine after the expiration date. NOTE: This sheet is a summary. It may not cover all possible information. If you have questions about this medicine, talk to your doctor, pharmacist, or health care provider.    2016, Elsevier/Gold Standard. (2010-08-11 12:21:39)  from your shoulders when carrying an object.  Always ask for help when lifting heavy or awkward objects. INCORRECT LIFTING TECHNIQUES DO NOT:   Lock your knees when lifting, even if it is a small object.  Bend and twist. Pivot at your feet or move your feet when needing to change directions.  Assume that you can safely pick up even a paper clip without proper posture.   This information is not intended to replace advice given to you by your health care provider. Make sure you discuss any questions you have with your health care provider.   Document Released: 08/08/2005 Document Revised: 08/29/2014 Document Reviewed: 11/20/2008 Elsevier Interactive Patient Education 2016 Elsevier Inc.   Doxepin capsules What is this medicine? DOXEPIN (DOX e pin) is used to treat depression and anxiety. This medicine may be used for other purposes; ask your health care provider or pharmacist if you have questions. What should I tell my health care provider before I take this medicine? They need to know if  you have any of these conditions: -bipolar disorder -difficulty passing urine -glaucoma -heart disease -if you frequently drink alcohol containing drinks -liver disease -lung or breathing disease, like asthma or sleep apnea -prostate trouble -schizophrenia -seizures -suicidal thoughts, plans, or attempt; a previous suicide attempt by you or a family member -an unusual or allergic reaction to doxepin, other medicines, foods, dyes, or  preservatives -pregnant or trying to get pregnant -breast-feeding How should I use this medicine? Take this medicine by mouth with a glass of water. Follow the directions on the prescription label. Take your doses at regular intervals. Do not take your medicine more often than directed. Do not stop taking this medicine suddenly except upon the advice of your doctor. Stopping this medicine too quickly may cause serious side effects or your condition may worsen. A special MedGuide will be given to you by the pharmacist with each prescription and refill. Be sure to read this information carefully each time. Talk to your pediatrician regarding the use of this medicine in children. While this drug may be prescribed for children as young as 12 years for selected conditions, precautions do apply. Overdosage: If you think you have taken too much of this medicine contact a poison control center or emergency room at once. NOTE: This medicine is only for you. Do not share this medicine with others. What if I miss a dose? If you miss a dose, take it as soon as you can. If it is almost time for your next dose, take only that dose. Do not take double or extra doses. What may interact with this medicine? Do not take this medicine with any of the following medications: -arsenic trioxide -certain medicines used to regulate abnormal heartbeat or to treat other heart conditions -cisapride -halofantrine -levomethadyl -linezolid -MAOIs like Carbex, Eldepryl, Marplan, Nardil, and Parnate -methylene blue -other medicines for mental depression -phenothiazines like perphenazine, thioridazine and chlorpromazine -pimozide -procarbazine -sparfloxacin -St. John's Wort -ziprasidone This medicine may also interact with the following medications: -cimetidine -tolazamide This list may not describe all possible interactions. Give your health care provider a list of all the medicines, herbs, non-prescription drugs, or  dietary supplements you use. Also tell them if you smoke, drink alcohol, or use illegal drugs. Some items may interact with your medicine. What should I watch for while using this medicine? Visit your doctor or health care professional for regular checks on your progress. It can take several days before you feel the full effect of this medicine. If you have been taking this medicine regularly for some time, do not suddenly stop taking it. You must gradually reduce the dose or you may get severe side effects. Ask your doctor or health care professional for advice. Even after you stop taking this medicine it can still affect your body for several days. Patients and their families should watch out for new or worsening thoughts of suicide or depression. Also watch out for sudden changes in feelings such as feeling anxious, agitated, panicky, irritable, hostile, aggressive, impulsive, severely restless, overly excited and hyperactive, or not being able to sleep. If this happens, especially at the beginning of treatment or after a change in dose, call your health care professional. Dennis Bast may get drowsy or dizzy. Do not drive, use machinery, or do anything that needs mental alertness until you know how this medicine affects you. Do not stand or sit up quickly, especially if you are an older patient. This reduces the risk of dizzy or fainting spells. Alcohol may increase  dizziness and drowsiness. Avoid alcoholic drinks. Do not treat yourself for coughs, colds, or allergies without asking your doctor or health care professional for advice. Some ingredients can increase possible side effects. Your mouth may get dry. Chewing sugarless gum or sucking hard candy, and drinking plenty of water may help. Contact your doctor if the problem does not go away or is severe. This medicine may cause dry eyes and blurred vision. If you wear contact lenses you may feel some discomfort. Lubricating drops may help. See your eye doctor if  the problem does not go away or is severe. This medicine can make you more sensitive to the sun. Keep out of the sun. If you cannot avoid being in the sun, wear protective clothing and use sunscreen. Do not use sun lamps or tanning beds/booths. What side effects may I notice from receiving this medicine? Side effects that you should report to your doctor or health care professional as soon as possible: -abnormal production of milk in females -allergic reactions like skin rash, itching or hives, swelling of the face, lips, or tongue -breast enlargement in both males and females -breathing problems -confusion, hallucinations -excessive thirst and/or hunger -fast, irregular or pounding heartbeat -fever with sweating -muscle stiffness, or spasms -passing urine more times in a day -seizures -suicidal thoughts or other mood changes -swelling of the testicles -tingling, pain, or numbness in the feet or hands -trouble passing urine or change in the amount of urine -yellowing of the eyes or skin Side effects that usually do not require medical attention (report to your doctor or health care professional if they continue or are bothersome): -change in sex drive or performance -constipation, or diarrhea -nausea, vomiting -weight gain or loss This list may not describe all possible side effects. Call your doctor for medical advice about side effects. You may report side effects to FDA at 1-800-FDA-1088. Where should I keep my medicine? Keep out of the reach of children. Store at room temperature between 15 and 30 degrees C (59 and 86 degrees F). Throw away any unused medicine after the expiration date. NOTE: This sheet is a summary. It may not cover all possible information. If you have questions about this medicine, talk to your doctor, pharmacist, or health care provider.    2016, Elsevier/Gold Standard. (2011-12-26 13:54:28)

## 2015-06-26 NOTE — Assessment & Plan Note (Signed)
Low back pain most likely exacerbation of previous noted pain as a result of excessive yard work yesterday. She conservatively with heat, home exercise therapy, and Robaxin as needed. Continue over-the-counter medications as needed for symptom relief and supportive care. Follow-up if symptoms worsen or fail to improve.

## 2015-06-26 NOTE — Progress Notes (Signed)
Pre visit review using our clinic review tool, if applicable. No additional management support is needed unless otherwise documented below in the visit note. 

## 2015-06-26 NOTE — Telephone Encounter (Signed)
PA initiated via covermymeds. Justin [name]

## 2015-06-26 NOTE — Progress Notes (Signed)
Subjective:    Patient ID: Justin [name], male    DOB: 06/12/1946, 69 y.o.   MRN: [id number]  Chief Complaint  Patient presents with  . Insomnia    averaging 2-3.5 hours a night. Seems to get worse when he has extra stress. Dxed with Bipolar disorder in 1985. would like referral to someone who can help treat.   . Back Pain    Strained back working in the yard yesterday.    HPI:  Justin [name] is a 69 y.o. male who  has a past medical history of Asthma; Arthritis; Chicken pox; Depression; Hyperlipidemia; and Migraines. and presents today for an office visit.  1.) Sleep disturbance - Associated symptom of sleep disturbance has been going on for about 3 weeks . Currently averaging about 2-3.5 hours of sleep nightly. Notes that he has been under a lot of stress recently which may be contributing to this. Modifying factors include melatonin and Tylenol which has not really helped. Previously diagnosed with Bipolar and was maintained with amitrypitiline which he discontinued taking it.   2.) Back pain - Associated symptom of pain located in the left side of his back has been going on for about 1 day since working in the yard. He does have scoliosis and was receiving cortisone injections. Denies any sounds/sensations heard or felt. Denies saddle anesthesia or changes to bowel or bladder.   Allergies  Allergen Reactions  . Ambien [Zolpidem Tartrate] Other (See Comments)    Unknown reaction     Current Outpatient Prescriptions on File Prior to Visit  Medication Sig Dispense Refill  . ALPRAZolam (XANAX) 0.5 MG tablet Take 1 tablet (0.5 mg total) by mouth 2 (two) times daily as needed. for anxiety 60 tablet 1  . clopidogrel (PLAVIX) 75 MG tablet Take 75 mg by mouth daily.    . clotrimazole-betamethasone (LOTRISONE) cream APPLY ONE CREAM TOPICALLY TWICE DAILY 15 g 0  . HYDROcodone-acetaminophen (NORCO) 5-325 MG per tablet Take 1 tablet by mouth every 6 (six) hours as needed. 20 tablet 0  .  nitroGLYCERIN (NITROSTAT) 0.3 MG SL tablet Place 0.3 mg under the tongue every 5 (five) minutes as needed for chest pain.    . rosuvastatin (CRESTOR) 20 MG tablet Take 20 mg by mouth daily.     No current facility-administered medications on file prior to visit.     Past Surgical History  Procedure Laterality Date  . Nasal septum surgery    . Hernia repair    . Neck surgery    . Elbow surgery Left   . Knee surgery Bilateral     Past Medical History  Diagnosis Date  . Asthma   . Arthritis   . Chicken pox   . Depression   . Hyperlipidemia   . Migraines     Review of Systems  Constitutional: Negative for fever and chills.  Musculoskeletal: Positive for back pain.  Neurological: Negative for weakness and numbness.  Psychiatric/Behavioral: Positive for sleep disturbance and decreased concentration. Negative for suicidal ideas. The patient is nervous/anxious and is hyperactive.       Objective:    BP 138/84 mmHg  Pulse 77  Temp(Src) 97.7 F (36.5 C) (Oral)  Resp 18  Wt 196 lb (88.905 kg)  SpO2 97% Nursing note and vital signs reviewed.  Physical Exam  Constitutional: He is oriented to person, place, and time. He appears well-developed and well-nourished. No distress.  Cardiovascular: Normal rate, regular rhythm, normal heart sounds and intact distal pulses.  Pulmonary/Chest: Effort normal and breath sounds normal.  Musculoskeletal:  Low back - no obvious deformity, discoloration, or edema Lobac noted. Palpable tenderness along the left paraspinal musculature. Range of motion is limited secondary to discomfort and pain. Distal pulses are intact and appropriate. Straight leg raise is negative.  Neurological: He is alert and oriented to person, place, and time.  Skin: Skin is warm and dry.  Psychiatric: His behavior is normal. Judgment and thought content normal. His mood appears anxious.       Assessment & Plan:   Problem List Items Addressed This Visit      Other    Low back pain    Low back pain most likely exacerbation of previous noted pain as a result of excessive yard work yesterday. She conservatively with heat, home exercise therapy, and Robaxin as needed. Continue over-the-counter medications as needed for symptom relief and supportive care. Follow-up if symptoms worsen or fail to improve.      Relevant Medications   methocarbamol (ROBAXIN) 500 MG tablet   naproxen (NAPROSYN) 500 MG tablet   Bipolar I disorder ([place]) - Primary    Previously diagnosed with bipolar disorder not currently taking any medications. Appears to be in a mixed state at this time with some mania and underlying depression. Start Lamictal. Start doxepin as needed for sleep. Refer to psychiatry for further management of bipolar. Follow-up in one month or sooner for worsening symptoms.      Relevant Medications   lamoTRIgine (LAMICTAL) 25 MG tablet   Doxepin HCl 3 MG TABS   Other Relevant Orders   Ambulatory referral to Psychiatry    Other Visit Diagnoses    Encounter for immunization

## 2015-06-26 NOTE — Telephone Encounter (Signed)
Patient was in today and insurance doesn't cover Doxepin HCl 3 MG TABS [[id number] He states he will take Lorrin Mais if need be  Pharmacy is [facility] in [place]

## 2015-06-26 NOTE — Telephone Encounter (Signed)
Sent in trazodone and tizanidine. Both are covered called pharmacy to verify.

## 2015-06-26 NOTE — Telephone Encounter (Signed)
[facility] in [place] has called stating insurance won't cover methocarbamol (ROBAXIN) 500 MG tablet [[id number]] also. She is wondering if they can change to tizanidine

## 2015-07-02 ENCOUNTER — Telehealth: Payer: Self-pay | Admitting: Family

## 2015-07-02 NOTE — Telephone Encounter (Signed)
Pt states the tiZANidine (ZANAFLEX) 4 MG tablet [id number] is not helping at all. Maybe something different.   He is also needing a refill for xanax. Pharmacy is [facility] in [place]  Can you please call him

## 2015-07-02 NOTE — Telephone Encounter (Signed)
Please advise on something else for pt to take. Also last refill date for xanax is 10/11 with 1 refill.

## 2015-07-02 NOTE — Telephone Encounter (Signed)
error 

## 2015-07-06 ENCOUNTER — Telehealth: Payer: Self-pay | Admitting: Family

## 2015-07-06 ENCOUNTER — Telehealth: Payer: Self-pay | Admitting: *Deleted

## 2015-07-06 MED ORDER — CYCLOBENZAPRINE HCL ER 15 MG PO CP24: 15.0000 mg | ORAL_CAPSULE | Freq: Every evening | ORAL | Status: DC | PRN

## 2015-07-06 MED ORDER — ALPRAZOLAM 0.5 MG PO TABS: 0.5000 mg | ORAL_TABLET | Freq: Two times a day (BID) | ORAL | Status: DC | PRN

## 2015-07-06 NOTE — Telephone Encounter (Signed)
Paper has been faxed. 

## 2015-07-06 NOTE — Telephone Encounter (Signed)
Melissa from [facility] called wanting to know if you received the fax they sent in for prescription for Silenor.  It is due by 1pm central time today and is hoping you can send this fax back in. Please call her at [phone] x [phone] Case [id number]

## 2015-07-06 NOTE — Telephone Encounter (Signed)
Pt left msg on triage stating he has called twice last week haven't heard back from office. The Tizanidine that was rx does not help requesting [name] to rx something else. Also is wanting refill on his Xanax. Pls advise...[name]

## 2015-07-06 NOTE — Telephone Encounter (Signed)
Amrix sent to pharmacy and Xanax refilled.

## 2015-07-06 NOTE — Telephone Encounter (Signed)
Notified pt Justin [name] sent new muscle relaxant & refill for xanax was fax to pharmacy...Justin [name]

## 2015-07-07 NOTE — Telephone Encounter (Signed)
Pt called to let us know that the pharmacy has not been able to fill the muscle relaxer because they are waiting on the insurance

## 2015-07-08 MED ORDER — ACETAMINOPHEN-CODEINE #3 300-30 MG PO TABS: 1.0000 | ORAL_TABLET | Freq: Three times a day (TID) | ORAL | Status: DC | PRN

## 2015-07-08 NOTE — Telephone Encounter (Signed)
Medication faxed to pharmacy 

## 2015-07-08 NOTE — Telephone Encounter (Signed)
Pt called and stated the muscle relaxer that was prescribed cost way too much. He states he seen an orthopedic last year and they prescribed tylenol with codeine that worked well for him without any problems from the insurance.

## 2015-07-08 NOTE — Telephone Encounter (Signed)
Patient informed. 

## 2015-07-08 NOTE — Addendum Note (Signed)
Addended by: Mauricio Po D on: 07/08/2015 01:02 PM   Modules accepted: Orders

## 2015-07-21 NOTE — Telephone Encounter (Signed)
Faxed approval to pharmacy

## 2015-07-21 NOTE — Telephone Encounter (Signed)
PA approved.

## 2015-07-27 ENCOUNTER — Ambulatory Visit (INDEPENDENT_AMBULATORY_CARE_PROVIDER_SITE_OTHER): Payer: Medicare Other | Admitting: Family

## 2015-07-27 ENCOUNTER — Encounter: Payer: Self-pay | Admitting: Family

## 2015-07-27 VITALS — BP 150/98 | HR 74 | Temp 97.5°F | Resp 18 | Ht 72.0 in | Wt 194.0 lb

## 2015-07-27 DIAGNOSIS — M545 Low back pain, unspecified: Secondary | ICD-10-CM

## 2015-07-27 DIAGNOSIS — F319 Bipolar disorder, unspecified: Secondary | ICD-10-CM

## 2015-07-27 MED ORDER — LAMOTRIGINE 100 MG PO TABS
100.0000 mg | ORAL_TABLET | Freq: Every day | ORAL | Status: DC
Start: 2015-07-27 — End: 2015-10-01

## 2015-07-27 MED ORDER — CYCLOBENZAPRINE HCL 10 MG PO TABS: 10.0000 mg | ORAL_TABLET | Freq: Three times a day (TID) | ORAL | Status: DC | PRN

## 2015-07-27 NOTE — Patient Instructions (Addendum)
Thank you for choosing [facility].  Summary/Instructions:  Your prescription(s) have been submitted to your pharmacy or been printed and provided for you. Please take as directed and contact our office if you believe you are having problem(s) with the medication(s) or have any questions.  If your symptoms worsen or fail to improve, please contact our office for further instruction, or in case of emergency go directly to the emergency room at the closest medical facility.     

## 2015-07-27 NOTE — Progress Notes (Signed)
Subjective:    Patient ID: Justin [name], male    DOB: 1945-08-25, 69 y.o.   MRN: [id number]  Chief Complaint  Patient presents with  . Follow-up    HPI:  Justin [name] is a 69 y.o. male who  has a past medical history of Asthma; Arthritis; Chicken pox; Depression; Hyperlipidemia; and Migraines. and presents today for an office follow up.    1.) Bipolar - Currently maintained on Lamictal. Takes the medication as prescribed and denies adverse side effects. Reports that his mood is improved and has had less mood swings. He is sleeping about 6-7 hours per night with the Trazodone. Takes the medication as prescribed and denies adverse side effects.   2. Back pain - Currently takes Tylenol 3 which does help with the pain and notes he has been working on his exercises. Overall improvements noted, however the pain is still mildly there. Does have constipation and a laxative usually resolves this.  Allergies  Allergen Reactions  . Ambien [Zolpidem Tartrate] Other (See Comments)    Unknown reaction     Current Outpatient Prescriptions on File Prior to Visit  Medication Sig Dispense Refill  . acetaminophen-codeine (TYLENOL #3) 300-30 MG tablet Take 1 tablet by mouth every 8 (eight) hours as needed for moderate pain. 90 tablet 0  . ALPRAZolam (XANAX) 0.5 MG tablet Take 1 tablet (0.5 mg total) by mouth 2 (two) times daily as needed. for anxiety 60 tablet 0  . clopidogrel (PLAVIX) 75 MG tablet Take 75 mg by mouth daily.    . clotrimazole-betamethasone (LOTRISONE) cream APPLY ONE CREAM TOPICALLY TWICE DAILY 15 g 0  . naproxen (NAPROSYN) 500 MG tablet Take 1 tablet (500 mg total) by mouth 2 (two) times daily. 30 tablet 0  . nitroGLYCERIN (NITROSTAT) 0.3 MG SL tablet Place 0.3 mg under the tongue every 5 (five) minutes as needed for chest pain.    . rosuvastatin (CRESTOR) 20 MG tablet Take 20 mg by mouth daily.    . traZODone (DESYREL) 50 MG tablet Take 1-2 tablets (50-100 mg total) by mouth at  bedtime as needed for sleep. 30 tablet 0   No current facility-administered medications on file prior to visit.     Review of Systems  Constitutional: Negative for fever and chills.  Musculoskeletal: Positive for back pain.  Psychiatric/Behavioral: Negative for suicidal ideas, hallucinations, behavioral problems, sleep disturbance, dysphoric mood and decreased concentration. The patient is not nervous/anxious.       Objective:    BP 150/98 mmHg  Pulse 74  Temp(Src) 97.5 F (36.4 C) (Oral)  Resp 18  Ht 6' (1.829 m)  Wt 194 lb (87.998 kg)  BMI 26.31 kg/m2  SpO2 96% Nursing note and vital signs reviewed.  Physical Exam  Constitutional: He is oriented to person, place, and time. He appears well-developed and well-nourished. No distress.  Cardiovascular: Normal rate, regular rhythm, normal heart sounds and intact distal pulses.   Pulmonary/Chest: Effort normal and breath sounds normal.  Musculoskeletal:  Low back - no obvious deformity, discoloration, or edema Lobac noted. Palpable tenderness along the left paraspinal musculature. Range of motion is limited secondary to discomfort and pain. Distal pulses are intact and appropriate. Straight leg raise is negative.   Neurological: He is alert and oriented to person, place, and time.  Skin: Skin is warm and dry.  Psychiatric: He has a normal mood and affect. His behavior is normal. Judgment and thought content normal.       Assessment &  Plan:   Problem List Items Addressed This Visit      Other   Low back pain    Low back pain improved since previous visit with Tylenol 3. Has not been able to start muscle relaxer secondary to insurance issues. Continue home exercise therapy and heat. If symptoms worsen and do not improve refer to orthopedics for repeat back injections.      Relevant Medications   cyclobenzaprine (FLEXERIL) 10 MG tablet   Bipolar I disorder ([place]) - Primary    Bipolar with improved control since starting  Lamictal, however does note occasional mood swings. Increase Lamictal to 100 mg daily. Denies adverse side effects. Has appointment to follow-up with psychiatry in January 2017. Follow-up if symptoms worsen or do not improve with medication.      Relevant Medications   lamoTRIgine (LAMICTAL) 100 MG tablet

## 2015-07-27 NOTE — Progress Notes (Signed)
Pre visit review using our clinic review tool, if applicable. No additional management support is needed unless otherwise documented below in the visit note. 

## 2015-07-27 NOTE — Assessment & Plan Note (Signed)
Bipolar with improved control since starting Lamictal, however does note occasional mood swings. Increase Lamictal to 100 mg daily. Denies adverse side effects. Has appointment to follow-up with psychiatry in January 2017. Follow-up if symptoms worsen or do not improve with medication.

## 2015-07-27 NOTE — Assessment & Plan Note (Signed)
Low back pain improved since previous visit with Tylenol 3. Has not been able to start muscle relaxer secondary to insurance issues. Continue home exercise therapy and heat. If symptoms worsen and do not improve refer to orthopedics for repeat back injections.

## 2015-08-05 ENCOUNTER — Telehealth: Payer: Self-pay

## 2015-08-05 NOTE — Telephone Encounter (Signed)
Call to spouse and Mr. Riga stated he was in recently and was suppose to get his prevnar, but forgot;  Scheduled for nurse visit at [facility]; Will ask wife if she has rec'd her flu shot .

## 2015-08-07 ENCOUNTER — Ambulatory Visit (INDEPENDENT_AMBULATORY_CARE_PROVIDER_SITE_OTHER): Payer: Medicare Other

## 2015-08-07 DIAGNOSIS — Z23 Encounter for immunization: Secondary | ICD-10-CM | POA: Diagnosis not present

## 2015-08-10 ENCOUNTER — Telehealth: Payer: Self-pay | Admitting: *Deleted

## 2015-08-10 MED ORDER — ALPRAZOLAM 0.5 MG PO TABS: 0.5000 mg | ORAL_TABLET | Freq: Two times a day (BID) | ORAL | Status: DC | PRN

## 2015-08-10 NOTE — Telephone Encounter (Signed)
Notified pt rx has been fax to [facility].../lmb 

## 2015-08-10 NOTE — Telephone Encounter (Signed)
Medication refilled

## 2015-08-10 NOTE — Telephone Encounter (Signed)
Left msg on triage stating he ran out of his xanas on sat wanting to get refill...Justin [name]

## 2015-08-25 ENCOUNTER — Telehealth: Payer: Self-pay

## 2015-08-25 ENCOUNTER — Other Ambulatory Visit: Payer: Self-pay

## 2015-08-25 MED ORDER — TRAZODONE HCL 50 MG PO TABS: 50.0000 mg | ORAL_TABLET | Freq: Every evening | ORAL | Status: DC | PRN

## 2015-08-25 NOTE — Telephone Encounter (Signed)
His wife is not listed on the DPR and cannot discuss any information regarding patient care.

## 2015-08-25 NOTE — Telephone Encounter (Signed)
Pts wife states that he is having issues with anxiety. She states that he is trying to work on the issues with his bipolar but is having a hard time with the anxiety part. Is there any way that something could be sent in for him? Please advise.

## 2015-08-26 NOTE — Telephone Encounter (Signed)
Tried to call patients wife, left message---if patients wife calls back, please inform we cannot discuss any patient information, wife is not on hippa form

## 2015-09-04 ENCOUNTER — Telehealth: Payer: Self-pay | Admitting: Family

## 2015-09-04 ENCOUNTER — Other Ambulatory Visit: Payer: Self-pay | Admitting: Family

## 2015-09-04 NOTE — Telephone Encounter (Signed)
Pt requesting refill for ALPRAZolam (XANAX) 0.5 MG tablet [id number] Pharmacy is [facility] in [place]

## 2015-09-04 NOTE — Telephone Encounter (Signed)
Last refill was 12/19

## 2015-09-07 MED ORDER — ALPRAZOLAM 0.5 MG PO TABS: 0.5000 mg | ORAL_TABLET | Freq: Two times a day (BID) | ORAL | Status: DC | PRN

## 2015-09-07 NOTE — Telephone Encounter (Signed)
Medication printed and dated for refill.

## 2015-09-07 NOTE — Telephone Encounter (Signed)
LVM letting pt know.  

## 2015-09-10 ENCOUNTER — Encounter (HOSPITAL_COMMUNITY): Payer: Self-pay | Admitting: Psychiatry

## 2015-09-10 ENCOUNTER — Encounter (INDEPENDENT_AMBULATORY_CARE_PROVIDER_SITE_OTHER): Payer: Self-pay

## 2015-09-10 ENCOUNTER — Ambulatory Visit (INDEPENDENT_AMBULATORY_CARE_PROVIDER_SITE_OTHER): Payer: Medicare Other | Admitting: Psychiatry

## 2015-09-10 VITALS — BP 147/90 | HR 98 | Ht 72.0 in | Wt 209.2 lb

## 2015-09-10 DIAGNOSIS — F3162 Bipolar disorder, current episode mixed, moderate: Secondary | ICD-10-CM | POA: Diagnosis not present

## 2015-09-10 MED ORDER — QUETIAPINE FUMARATE 50 MG PO TABS: ORAL_TABLET | ORAL | Status: DC
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
## 2015-10-01 ENCOUNTER — Encounter (HOSPITAL_COMMUNITY): Payer: Self-pay | Admitting: Psychiatry

## 2015-10-01 ENCOUNTER — Ambulatory Visit (INDEPENDENT_AMBULATORY_CARE_PROVIDER_SITE_OTHER): Payer: Medicare Other | Admitting: Psychiatry

## 2015-10-01 VITALS — BP 138/84 | HR 69 | Ht 72.0 in | Wt 194.0 lb

## 2015-10-01 DIAGNOSIS — F319 Bipolar disorder, unspecified: Secondary | ICD-10-CM

## 2015-10-01 DIAGNOSIS — F3162 Bipolar disorder, current episode mixed, moderate: Secondary | ICD-10-CM

## 2015-10-01 MED ORDER — LAMOTRIGINE 150 MG PO TABS: 150.0000 mg | ORAL_TABLET | Freq: Every day | ORAL | Status: DC

## 2015-10-01 MED ORDER — QUETIAPINE FUMARATE 100 MG PO TABS: 100.0000 mg | ORAL_TABLET | Freq: Every day | ORAL | Status: DC
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
## 2015-10-12 ENCOUNTER — Encounter: Payer: Self-pay | Admitting: Family

## 2015-10-12 ENCOUNTER — Ambulatory Visit (INDEPENDENT_AMBULATORY_CARE_PROVIDER_SITE_OTHER): Payer: Medicare Other | Admitting: Family

## 2015-10-12 VITALS — BP 150/90 | HR 76 | Temp 97.7°F | Resp 16 | Ht 72.0 in | Wt 196.0 lb

## 2015-10-12 DIAGNOSIS — M545 Low back pain, unspecified: Secondary | ICD-10-CM

## 2015-10-12 DIAGNOSIS — B354 Tinea corporis: Secondary | ICD-10-CM

## 2015-10-12 DIAGNOSIS — F411 Generalized anxiety disorder: Secondary | ICD-10-CM

## 2015-10-12 MED ORDER — CYCLOBENZAPRINE HCL 10 MG PO TABS: 10.0000 mg | ORAL_TABLET | Freq: Three times a day (TID) | ORAL | Status: DC | PRN

## 2015-10-12 MED ORDER — ALPRAZOLAM 0.5 MG PO TABS: 0.5000 mg | ORAL_TABLET | Freq: Two times a day (BID) | ORAL | Status: DC | PRN

## 2015-10-12 MED ORDER — CLOTRIMAZOLE-BETAMETHASONE 1-0.05 % EX CREA: TOPICAL_CREAM | CUTANEOUS | Status: DC

## 2015-10-12 NOTE — Assessment & Plan Note (Signed)
Symptoms and exam consistent with tinea corporis. Start Lotrisone. Follow-up if symptoms worsen or fail to improve.

## 2015-10-12 NOTE — Progress Notes (Signed)
Pre visit review using our clinic review tool, if applicable. No additional management support is needed unless otherwise documented below in the visit note. 

## 2015-10-12 NOTE — Progress Notes (Signed)
Subjective:    Patient ID: Justin [name], male    DOB: Jan 30, 1946, 70 y.o.   MRN: [id number]  Chief Complaint  Patient presents with  . Rash    has a rash on his back side that has been there for a couple, itching, has gotten worse    HPI:  Justin [name] is a 70 y.o. male who  has a past medical history of Asthma; Arthritis; Chicken pox; Depression; Hyperlipidemia; and Migraines. and presents today for an acute office visit.   This is a new problem. Associated symptom of a rash located on his back side has been going on for about 2 months with waxing and waning. Modifying factors include lotrisone cream which did help initially and when he ran out of the medication the rash came back. Described as itchy and red. Denies fevers. Reports that he showers on a regular basis.   Allergies  Allergen Reactions  . Ambien [Zolpidem Tartrate] Other (See Comments)    Unknown reaction     Current Outpatient Prescriptions on File Prior to Visit  Medication Sig Dispense Refill  . acetaminophen-codeine (TYLENOL #3) 300-30 MG tablet Take 1 tablet by mouth every 8 (eight) hours as needed for moderate pain. 90 tablet 0  . clopidogrel (PLAVIX) 75 MG tablet Take 75 mg by mouth daily.    Marland Kitchen lamoTRIgine (LAMICTAL) 150 MG tablet Take 1 tablet (150 mg total) by mouth daily. 30 tablet 0  . naproxen (NAPROSYN) 500 MG tablet Take 1 tablet (500 mg total) by mouth 2 (two) times daily. 30 tablet 0  . nitroGLYCERIN (NITROSTAT) 0.3 MG SL tablet Place 0.3 mg under the tongue every 5 (five) minutes as needed for chest pain.    Marland Kitchen QUEtiapine (SEROQUEL) 100 MG tablet Take 1 tablet (100 mg total) by mouth at bedtime. 30 tablet 0  . rosuvastatin (CRESTOR) 20 MG tablet Take 20 mg by mouth daily.    . traZODone (DESYREL) 50 MG tablet Take 1-2 tablets (50-100 mg total) by mouth at bedtime as needed for sleep. 30 tablet 0   No current facility-administered medications on file prior to visit.    Review of Systems    Constitutional: Negative for fever and chills.  Skin: Positive for rash.      Objective:    BP 150/90 mmHg  Pulse 76  Temp(Src) 97.7 F (36.5 C) (Oral)  Resp 16  Ht 6' (1.829 m)  Wt 196 lb (88.905 kg)  BMI 26.58 kg/m2  SpO2 95% Nursing note and vital signs reviewed.  Physical Exam  Constitutional: He is oriented to person, place, and time. He appears well-developed and well-nourished. No distress.  Cardiovascular: Normal rate, regular rhythm, normal heart sounds and intact distal pulses.   Pulmonary/Chest: Effort normal and breath sounds normal.  Neurological: He is alert and oriented to person, place, and time.  Skin: Skin is warm and dry.  5 cm x 5 cm, flat, annular shaped, reddened border with clear center located in the inner aspect of the left gluteal.   Psychiatric: He has a normal mood and affect. His behavior is normal. Judgment and thought content normal.       Assessment & Plan:   Problem List Items Addressed This Visit      Musculoskeletal and Integument   Tinea corporis - Primary    Symptoms and exam consistent with tinea corporis. Start Lotrisone. Follow-up if symptoms worsen or fail to improve.      Relevant Medications   clotrimazole-betamethasone (  LOTRISONE) cream     Other   Generalized anxiety disorder   Relevant Medications   ALPRAZolam (XANAX) 0.5 MG tablet   Low back pain   Relevant Medications   cyclobenzaprine (FLEXERIL) 10 MG tablet

## 2015-10-12 NOTE — Patient Instructions (Signed)
Thank you for choosing [facility].  Summary/Instructions:  Your prescription(s) have been submitted to your pharmacy or been printed and provided for you. Please take as directed and contact our office if you believe you are having problem(s) with the medication(s) or have any questions.  If your symptoms worsen or fail to improve, please contact our office for further instruction, or in case of emergency go directly to the emergency room at the closest medical facility.   Body Ringworm Ringworm (tinea corporis) is a fungal infection of the skin on the body. This infection is not caused by worms, but is actually caused by a fungus. Fungus normally lives on the top of your skin and can be useful. However, in the case of ringworms, the fungus grows out of control and causes a skin infection. It can involve any area of skin on the body and can spread easily from one person to another (contagious). Ringworm is a common problem for children, but it can affect adults as well. Ringworm is also often found in athletes, especially wrestlers who share equipment and mats.  CAUSES  Ringworm of the body is caused by a fungus called dermatophyte. It can spread by:  Touchingother people who are infected.  Touchinginfected pets.  Touching or sharingobjects that have been in contact with the infected person or pet (hats, combs, towels, clothing, sports equipment). SYMPTOMS   Itchy, raised red spots and bumps on the skin.  Ring-shaped rash.  Redness near the border of the rash with a clear center.  Dry and scaly skin on or around the rash. Not every person develops a ring-shaped rash. Some develop only the red, scaly patches. DIAGNOSIS  Most often, ringworm can be diagnosed by performing a skin exam. Your caregiver may choose to take a skin scraping from the affected area. The sample will be examined under the microscope to see if the fungus is present.  TREATMENT  Body ringworm may be  treated with a topical antifungal cream or ointment. Sometimes, an antifungal shampoo that can be used on your body is prescribed. You may be prescribed antifungal medicines to take by mouth if your ringworm is severe, keeps coming back, or lasts a long time.  HOME CARE INSTRUCTIONS   Only take over-the-counter or prescription medicines as directed by your caregiver.  Wash the infected area and dry it completely before applying yourcream or ointment.  When using antifungal shampoo to treat the ringworm, leave the shampoo on the body for 3-5 minutes before rinsing.   Wear loose clothing to stop clothes from rubbing and irritating the rash.  Wash or change your bed sheets every night while you have the rash.  Have your pet treated by your veterinarian if it has the same infection. To prevent ringworm:   Practice good hygiene.  Wear sandals or shoes in public places and showers.  Do not share personal items with others.  Avoid touching red patches of skin on other people.  Avoid touching pets that have bald spots or wash your hands after doing so. SEEK MEDICAL CARE IF:   Your rash continues to spread after 7 days of treatment.  Your rash is not gone in 4 weeks.  The area around your rash becomes red, warm, tender, and swollen.   This information is not intended to replace advice given to you by your health care provider. Make sure you discuss any questions you have with your health care provider.   Document Released: 08/05/2000 Document Revised: 05/02/2012 Document  Reviewed: 02/20/2012 Elsevier Interactive Patient Education [employer].

## 2015-10-29 ENCOUNTER — Encounter (HOSPITAL_COMMUNITY): Payer: Self-pay | Admitting: Psychiatry

## 2015-10-29 ENCOUNTER — Ambulatory Visit (INDEPENDENT_AMBULATORY_CARE_PROVIDER_SITE_OTHER): Payer: Medicare Other | Admitting: Psychiatry

## 2015-10-29 VITALS — BP 150/87 | HR 76 | Ht 71.0 in | Wt 196.0 lb

## 2015-10-29 DIAGNOSIS — F319 Bipolar disorder, unspecified: Secondary | ICD-10-CM | POA: Diagnosis not present

## 2015-10-29 DIAGNOSIS — F411 Generalized anxiety disorder: Secondary | ICD-10-CM

## 2015-10-29 MED ORDER — LAMOTRIGINE 150 MG PO TABS: 150.0000 mg | ORAL_TABLET | Freq: Every day | ORAL | Status: DC

## 2015-10-29 MED ORDER — CLONAZEPAM 0.5 MG PO TABS: 0.5000 mg | ORAL_TABLET | Freq: Two times a day (BID) | ORAL | Status: DC

## 2015-10-29 MED ORDER — QUETIAPINE FUMARATE 50 MG PO TABS: 50.0000 mg | ORAL_TABLET | Freq: Every day | ORAL | Status: DC

## 2015-10-29 NOTE — Progress Notes (Signed)
[place] [phone] Progress Note  Justin [name] [id number] 70 y.o.  10/29/2015 2:20 PM  Chief Complaint: I am very anxious.  My wife is in the hospital.  She had pneumonia.  History of Present Illness:  Justin [name] came for his follow-up appointment.   he is feeling very anxious nervous and overwhelmed.  Patient told her wife developed diverticulitis and she required surgery and after surgery she had complications and readmitted because of pneumonia.  She isn't ICU but slowly and gradually getting better.  Patient told since her wife got sick she is more close to her wife and trying to visit Hospital at least 2 times per day.  He is taking Xanax 0.5 mg twice a day prescribed by primary care physician but he feels it is not helping him.  He still feels very nervous anxious .  He cut down his Seroquel because 100 mg Seroquel was making him very groggy and sleepy next day.  He is only taking 50 mg.  He really liked Lamictal which is helping his irritability mood swing and anger.  He had rash and he thought it was causing by Lamictal and he went to his primary care physician but turned out to be he has infection.  He is taking medication and his rash is resolved.  He is taking Lamictal 150 mg.  Denies any itching .  Patient told that his wife may go to nursing home upon discharge for physical therapy however if she did not go then she may come back home and he like to help her until she fully recovered.  Patient has noticed much improvement in his relationship with his wife.  However he he still feels very nervous anxious about her physical health.  Didn't also endorsed improvement in his residential but the son.  He sleeping 6-7 hours.  He does not want to take a higher dose of Seroquel because it makes him very groggy.  He is no longer taking trazodone.  He denies any paranoia, hallucination.  He is still have some time flashback and nightmares but he denies any feeling of hopelessness or  worthlessness.  He denies any crying spells.  His energy level is good.  He is scheduled to see Tharon Aquas on 29th.  Patient denies drinking  Patient is retired.Patient has 2 children who are grown and live close by.  Patient endorse a lot of family issues and has no communication with his son  Until recently his son and wife to start talking to him.  Suicidal Ideation: No Plan Formed: No Patient has means to carry out plan: No  Homicidal Ideation: No Plan Formed: No Patient has means to carry out plan: No  Past Psychiatric History/Hospitalization(s): Patient has bipolar disorder and at least more than 15 psychiatric hospitalization.  Most of psychiatric hospitalization was due to mania, depression, suicidal attempt.  He has taken overdose on his psychiatric medication including Ambien, Vistaril and alcohol.  His last psychiatric hospitalization was in 2007 at [facility].  In the past he has taken Zyprexa, Cymbalta, Seroquel, Vistaril, Ambien, Neurontin , Wellbutrin and Trileptal however he do not remember the details very well.  He did remember lithium cause side effects.  He was seeing Dr. Letta Moynahan and Parthenia Ames.  Patient has history of mania, psychosis, hallucination and severe depression.   Anxiety: No Bipolar Disorder: Yes Depression: Yes Mania: Yes Psychosis: Yes Schizophrenia: No Personality Disorder: No Hospitalization for psychiatric illness: Yes History of Electroconvulsive Shock Therapy: No  Prior Suicide Attempts: Yes  Medical History; Patient has asthma, chronic joint pain, hyperlipidemia, history of chickenpox, headaches,.  He has history of nasal septum surgery, hernia repair, back surgery, elbow surgery and knee surgery.   Family History; Patient endorse brother committed suicide and sister had attempted suicide and later died due to complication.      Substance Abuse History; Patient endorse history of drinking in the past but claims to be sober from  heavy drinking in recent 5 years.  He admitted social drinking beer but denies any intoxication or any binge.  He denies any illegal substance use.    Review of Systems  Constitutional: Negative.   Cardiovascular: Negative for chest pain and palpitations.  Musculoskeletal: Negative.   Skin: Negative for itching and rash.  Neurological: Negative for dizziness, tremors and headaches.  Psychiatric/Behavioral: Negative for suicidal ideas, hallucinations and substance abuse. The patient is nervous/anxious.     Psychiatric: Agitation: No Hallucination: No Depressed Mood: No Insomnia: No Hypersomnia: No Altered Concentration: No Feels Worthless: No Grandiose Ideas: No Belief In Special Powers: No New/Increased Substance Abuse: No Compulsions: No  Neurologic: Headache: No Seizure: No Paresthesias: No   Outpatient Encounter Prescriptions as of 10/29/2015  Medication Sig  . clonazePAM (KLONOPIN) 0.5 MG tablet Take 1 tablet (0.5 mg total) by mouth 2 (two) times daily.  . clopidogrel (PLAVIX) 75 MG tablet Take 75 mg by mouth daily.  . clotrimazole-betamethasone (LOTRISONE) cream APPLY ONE CREAM TOPICALLY TWICE DAILY  . lamoTRIgine (LAMICTAL) 150 MG tablet Take 1 tablet (150 mg total) by mouth daily.  . nitroGLYCERIN (NITROSTAT) 0.3 MG SL tablet Place 0.3 mg under the tongue every 5 (five) minutes as needed for chest pain.  Marland Kitchen QUEtiapine (SEROQUEL) 50 MG tablet Take 1 tablet (50 mg total) by mouth at bedtime.  . rosuvastatin (CRESTOR) 20 MG tablet Take 20 mg by mouth daily.  . [DISCONTINUED] acetaminophen-codeine (TYLENOL #3) 300-30 MG tablet Take 1 tablet by mouth every 8 (eight) hours as needed for moderate pain.  . [DISCONTINUED] ALPRAZolam (XANAX) 0.5 MG tablet Take 1 tablet (0.5 mg total) by mouth 2 (two) times daily as needed. for anxiety  . [DISCONTINUED] cyclobenzaprine (FLEXERIL) 10 MG tablet Take 1 tablet (10 mg total) by mouth 3 (three) times daily as needed for muscle spasms.  .  [DISCONTINUED] lamoTRIgine (LAMICTAL) 150 MG tablet Take 1 tablet (150 mg total) by mouth daily.  . [DISCONTINUED] naproxen (NAPROSYN) 500 MG tablet Take 1 tablet (500 mg total) by mouth 2 (two) times daily.  . [DISCONTINUED] QUEtiapine (SEROQUEL) 100 MG tablet Take 1 tablet (100 mg total) by mouth at bedtime.  . [DISCONTINUED] QUEtiapine (SEROQUEL) 50 MG tablet   . [DISCONTINUED] traZODone (DESYREL) 50 MG tablet Take 1-2 tablets (50-100 mg total) by mouth at bedtime as needed for sleep.   No facility-administered encounter medications on file as of 10/29/2015.    No results found for this or any previous visit (from the past 2160 hour(s)).    Constitutional:  BP 150/87 mmHg  Pulse 76  Ht 5\' 11"  (1.803 m)  Wt 196 lb (88.905 kg)  BMI 27.35 kg/m2   Musculoskeletal: Strength & Muscle Tone: within normal limits Gait & Station: normal Patient leans: N/A  Psychiatric Specialty Exam: General Appearance: Casual  Eye Contact::  Fair  Speech:  Slow  Volume:  Decreased  Mood:  Anxious  Affect:  Depressed  Thought Process:  Circumstantial  Orientation:  Full (Time, Place, and Person)  Thought Content:  Rumination  Suicidal Thoughts:  No  Homicidal Thoughts:  No  Memory:  Immediate;   Fair Recent;   Fair Remote;   Fair  Judgement:  Fair  Insight:  Fair  Psychomotor Activity:  Decreased  Concentration:  Fair  Recall:  [employer] of Knowledge:  Fair  Language:  Fair  Akathisia:  No  Handed:  Right  AIMS (if indicated):     Assets:  Communication Skills Desire for Improvement Financial Resources/Insurance Housing Physical Health  ADL's:  Intact  Cognition:  WNL  Sleep:        Established Problem, Stable/Improving (1), Review of Psycho-Social Stressors (1), Review and summation of old records (2), Established Problem, Worsening (2), Review of Last Therapy Session (1), Review of Medication Regimen & Side Effects (2) and Review of New Medication or Change in Dosage  (2)  Assessment: Axis I: Bipolar disorder, mixed.  Rule out posttraumatic stress disorder.  Rule out major depressive disorder with psychosis  Axis II: deferred   Axis III:  Past Medical History  Diagnosis Date  . Asthma   . Arthritis   . Chicken pox   . Depression   . Hyperlipidemia   . Migraines      Plan:  I review his psychosocial stressors , current medication and records from his primary care physician.  Though he feels that medicine helping his mood, anger and irritability but he still feel anxious and nervous.  Recommended to discontinue Xanax and tried Klonopin 0.5 mg 2 times a day .  Discuss longer half-life of Klonopin.  Patient is no longer taking trazodone.  I will discontinue trazodone.  He like to try Seroquel 50 mg as 100 mg makes him very groggy.  He is tolerating Lamictal without any side effects.  He has no headaches, itching , EPS or tremors.  He is scheduled to see Tharon Aquas for counseling.  Patient is happy that his wife may come home months she discharged from the hospital and hoping that this will help him to build her confidence and he can take care of her.  Discussed medication side effects especially benzodiazepine dependence tolerance and withdrawal.  Recommended to call us back if he feel worsening of the symptoms.  I will see him again in 2 months. Discuss safety plan that anytime having active suicidal thoughts or homicidal thoughts then patient need to call 911 or go to the local emergency room.    Jianna Drabik T., MD 10/29/2015

## 2015-11-10 ENCOUNTER — Telehealth: Payer: Self-pay | Admitting: Family

## 2015-11-10 NOTE — Telephone Encounter (Signed)
Pt is requesting refill for ALPRAZolam Duanne Moron) 0.5 MG tablet [id number] DISCONTINUED He states his wife just passed over the weekend and he is in need

## 2015-11-10 NOTE — Telephone Encounter (Signed)
Let pt know

## 2015-11-10 NOTE — Telephone Encounter (Signed)
I am sorry to hear about his wife, however it appears that Dr. Adele Schilder just refilled a prescription for a 30 day supply 10 days ago. I would advise him to contact Dr. Adele Schilder to discuss further.

## 2015-11-18 ENCOUNTER — Ambulatory Visit (HOSPITAL_COMMUNITY): Payer: Self-pay | Admitting: Clinical

## 2015-12-03 ENCOUNTER — Telehealth (HOSPITAL_COMMUNITY): Payer: Self-pay

## 2015-12-03 ENCOUNTER — Encounter (HOSPITAL_COMMUNITY): Payer: Self-pay | Admitting: Clinical

## 2015-12-03 ENCOUNTER — Other Ambulatory Visit (HOSPITAL_COMMUNITY): Payer: Self-pay | Admitting: Psychiatry

## 2015-12-03 ENCOUNTER — Ambulatory Visit (INDEPENDENT_AMBULATORY_CARE_PROVIDER_SITE_OTHER): Payer: Medicare Other | Admitting: Clinical

## 2015-12-03 DIAGNOSIS — F4321 Adjustment disorder with depressed mood: Secondary | ICD-10-CM

## 2015-12-03 DIAGNOSIS — F3162 Bipolar disorder, current episode mixed, moderate: Secondary | ICD-10-CM | POA: Diagnosis not present

## 2015-12-03 DIAGNOSIS — F319 Bipolar disorder, unspecified: Secondary | ICD-10-CM

## 2015-12-03 DIAGNOSIS — F4310 Post-traumatic stress disorder, unspecified: Secondary | ICD-10-CM

## 2015-12-03 DIAGNOSIS — F440 Dissociative amnesia: Secondary | ICD-10-CM

## 2015-12-03 MED ORDER — LAMOTRIGINE 150 MG PO TABS: 150.0000 mg | ORAL_TABLET | Freq: Every day | ORAL | Status: DC

## 2015-12-03 NOTE — Telephone Encounter (Signed)
Patient was here to see Justin [name] today and came to my office about his prescriptions. Two of them had refills and I called the pharmacy to verify this, however the Lamictal did not have a refill - patient has a follow up appointment next month. Is it okay to refill the Lamictal? Please review and advise, thank you

## 2015-12-03 NOTE — Telephone Encounter (Signed)
done

## 2015-12-07 ENCOUNTER — Ambulatory Visit (INDEPENDENT_AMBULATORY_CARE_PROVIDER_SITE_OTHER): Payer: Medicare Other | Admitting: Clinical

## 2015-12-07 ENCOUNTER — Encounter (HOSPITAL_COMMUNITY): Payer: Self-pay | Admitting: Clinical

## 2015-12-07 DIAGNOSIS — F3162 Bipolar disorder, current episode mixed, moderate: Secondary | ICD-10-CM

## 2015-12-07 DIAGNOSIS — F440 Dissociative amnesia: Secondary | ICD-10-CM

## 2015-12-07 DIAGNOSIS — F4310 Post-traumatic stress disorder, unspecified: Secondary | ICD-10-CM | POA: Diagnosis not present

## 2015-12-07 DIAGNOSIS — F4321 Adjustment disorder with depressed mood: Secondary | ICD-10-CM | POA: Diagnosis not present
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
## 2015-12-10 ENCOUNTER — Ambulatory Visit (INDEPENDENT_AMBULATORY_CARE_PROVIDER_SITE_OTHER): Payer: Medicare Other | Admitting: Internal Medicine

## 2015-12-10 ENCOUNTER — Encounter: Payer: Self-pay | Admitting: Internal Medicine

## 2015-12-10 VITALS — BP 170/102 | HR 83 | Temp 97.6°F | Resp 16 | Ht 72.0 in | Wt 193.0 lb

## 2015-12-10 DIAGNOSIS — R030 Elevated blood-pressure reading, without diagnosis of hypertension: Secondary | ICD-10-CM | POA: Diagnosis not present

## 2015-12-10 DIAGNOSIS — R21 Rash and other nonspecific skin eruption: Secondary | ICD-10-CM

## 2015-12-10 DIAGNOSIS — M7021 Olecranon bursitis, right elbow: Secondary | ICD-10-CM

## 2015-12-10 DIAGNOSIS — M545 Low back pain, unspecified: Secondary | ICD-10-CM

## 2015-12-10 DIAGNOSIS — IMO0001 Reserved for inherently not codable concepts without codable children: Secondary | ICD-10-CM

## 2015-12-10 DIAGNOSIS — I10 Essential (primary) hypertension: Secondary | ICD-10-CM

## 2015-12-10 MED ORDER — ACETAMINOPHEN-CODEINE #3 300-30 MG PO TABS: 1.0000 | ORAL_TABLET | Freq: Four times a day (QID) | ORAL | Status: DC | PRN

## 2015-12-10 MED ORDER — TRIAMCINOLONE ACETONIDE 0.1 % EX CREA: 1.0000 "application " | TOPICAL_CREAM | Freq: Two times a day (BID) | CUTANEOUS | Status: DC

## 2015-12-10 NOTE — Assessment & Plan Note (Signed)
Rx for tylenol #3 and advised to work on stretching exercises and safe lifting techniques.

## 2015-12-10 NOTE — Progress Notes (Signed)
Pre visit review using our clinic review tool, if applicable. No additional management support is needed unless otherwise documented below in the visit note. 

## 2015-12-10 NOTE — Assessment & Plan Note (Signed)
8 mL serosanguinous fluid removed, consent signed prior and tolerated procedure well. Given icing regimen. No antibiotics indicated. Relief with procedure.

## 2015-12-10 NOTE — Progress Notes (Signed)
   Subjective:    Patient ID: Justin [name], male    DOB: Oct 06, 1945, 70 y.o.   MRN: [id number]  HPI The patient is a 70 YO man coming in for several acute concerns including right elbow pain (area swollen and given steroid 1-2 months ago at urgent care, never drained, has shrunk some, very tender, has tried ice without good relief), rash (on his buttock and itching, present for about 2-3 weeks, no new detergents or clothes or allergens), and pain in his back (recent loss of his wife and trying to move and organize her stuff and thinks he pulled his back, previous injections for relief in January to avoid surgery, has scoliosis).   Review of Systems  Constitutional: Negative.   HENT: Negative.   Eyes: Negative.   Respiratory: Negative.   Cardiovascular: Negative.   Gastrointestinal: Negative.   Musculoskeletal: Positive for myalgias, back pain and arthralgias.  Skin: Positive for rash.  Neurological: Negative.   Psychiatric/Behavioral: Positive for sleep disturbance, dysphoric mood and decreased concentration. Negative for suicidal ideas and self-injury. The patient is nervous/anxious.       Objective:   Physical Exam  Constitutional: He is oriented to person, place, and time. He appears well-developed and well-nourished.  HENT:  Head: Normocephalic and atraumatic.  Eyes: EOM are normal.  Neck: Normal range of motion.  Cardiovascular: Normal rate and regular rhythm.   Pulmonary/Chest: Effort normal and breath sounds normal.  Abdominal: Soft. He exhibits no distension. There is no tenderness.  Musculoskeletal: He exhibits edema.  Right elbow bursa with fluid collection, tenderness with the lumbar region paraspinal and some spinal.   Neurological: He is alert and oriented to person, place, and time. Coordination normal.  Skin: Skin is warm and dry.  Small circular rash on the left buttock without satellite lesion   Filed Vitals:   12/10/15 0824  BP: 170/102  Pulse: 83  Temp:  97.6 F (36.4 C)  TempSrc: Oral  Resp: 16  Height: 6' (1.829 m)  Weight: 193 lb (87.544 kg)  SpO2: 98%       Assessment & Plan:  I and D done, see procedure note

## 2015-12-10 NOTE — Assessment & Plan Note (Signed)
Rx for triamcinolone cream. Does not appear to be fungal in nature.

## 2015-12-10 NOTE — Assessment & Plan Note (Signed)
Suspect elevated today due to grief and pain. Needs follow up at next visit and is on diltiazem recently from cardiology which he has not taken yet today.

## 2015-12-10 NOTE — Patient Instructions (Addendum)
We have drained the elbow and you will need to ice it today every 4-6 hours. You can use frozen vegetables or other or ice pack. Leave it on the area for about 10-15 minutes.   We have given you the tylenol #3 and sent in the cream to your pharmacy.   Incision and Drainage, Care After Refer to this sheet in the next few weeks. These instructions provide you with information on caring for yourself after your procedure. Your caregiver may also give you more specific instructions. Your treatment has been planned according to current medical practices, but problems sometimes occur. Call your caregiver if you have any problems or questions after your procedure. HOME CARE INSTRUCTIONS   If antibiotic medicine is given, take it as directed. Finish it even if you start to feel better.  Only take over-the-counter or prescription medicines for pain, discomfort, or fever as directed by your caregiver.  Keep all follow-up appointments as directed by your caregiver.  Change any bandages (dressings) as directed by your caregiver. Replace old dressings with clean dressings.  Wash your hands before and after caring for your wound. You will receive specific instructions for cleansing and caring for your wound.  SEEK MEDICAL CARE IF:   You have increased pain, swelling, or redness around the wound.  You have increased drainage, smell, or bleeding from the wound.  You have muscle aches, chills, or you feel generally sick.  You have a fever. MAKE SURE YOU:   Understand these instructions.  Will watch your condition.  Will get help right away if you are not doing well or get worse.   This information is not intended to replace advice given to you by your health care provider. Make sure you discuss any questions you have with your health care provider.   Document Released: 10/31/2011 Document Revised: 08/29/2014 Document Reviewed: 10/31/2011 Elsevier Interactive Patient Education [employer].

## 2015-12-10 NOTE — Progress Notes (Signed)
Bursa Aspiration with Injection Procedure Note  Pre-operative Diagnosis: right Olecranon bursitis  Post-operative Diagnosis: same  Indications: Diagnosis and treatment of symptomatic bursal effusion  Anesthesia: Lidocaine 1% without epinephrine without added sodium bicarbonate  Procedure Details   After a discussion of the risks and benefits with the patient (including the possibility that any manipulation of the bursa could introduce infection, worsening the current situation significantly), verbal consent was obtained for the procedure. The joint was prepped with Betadine and a small wheel of local anesthesia was injected into the subcutaneous tissue. An 18 gauge needle was introduced into the fluid collection and 8 ml of serosanguinous fluid was removed and discarded. No corticosteroids were given. The injection site was cleansed with topical isopropyl alcohol and a dressing was applied.  Complications:  None; patient tolerated the procedure well.
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
## 2015-12-14 ENCOUNTER — Ambulatory Visit (INDEPENDENT_AMBULATORY_CARE_PROVIDER_SITE_OTHER): Payer: Medicare Other | Admitting: Clinical

## 2015-12-14 DIAGNOSIS — F440 Dissociative amnesia: Secondary | ICD-10-CM | POA: Diagnosis not present

## 2015-12-14 DIAGNOSIS — F3162 Bipolar disorder, current episode mixed, moderate: Secondary | ICD-10-CM | POA: Diagnosis not present

## 2015-12-14 DIAGNOSIS — F4321 Adjustment disorder with depressed mood: Secondary | ICD-10-CM | POA: Diagnosis not present

## 2015-12-14 DIAGNOSIS — F4310 Post-traumatic stress disorder, unspecified: Secondary | ICD-10-CM

## 2015-12-15 ENCOUNTER — Encounter (HOSPITAL_COMMUNITY): Payer: Self-pay | Admitting: Clinical
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
## 2015-12-21 ENCOUNTER — Ambulatory Visit (INDEPENDENT_AMBULATORY_CARE_PROVIDER_SITE_OTHER): Payer: Medicare Other | Admitting: Clinical

## 2015-12-21 ENCOUNTER — Encounter (HOSPITAL_COMMUNITY): Payer: Self-pay | Admitting: Clinical

## 2015-12-21 DIAGNOSIS — F3162 Bipolar disorder, current episode mixed, moderate: Secondary | ICD-10-CM

## 2015-12-21 DIAGNOSIS — F4310 Post-traumatic stress disorder, unspecified: Secondary | ICD-10-CM | POA: Diagnosis not present

## 2015-12-21 DIAGNOSIS — F440 Dissociative amnesia: Secondary | ICD-10-CM | POA: Diagnosis not present

## 2015-12-21 DIAGNOSIS — F4321 Adjustment disorder with depressed mood: Secondary | ICD-10-CM | POA: Diagnosis not present
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
## 2015-12-29 ENCOUNTER — Ambulatory Visit (INDEPENDENT_AMBULATORY_CARE_PROVIDER_SITE_OTHER): Payer: Medicare Other | Admitting: Psychiatry

## 2015-12-29 ENCOUNTER — Encounter (HOSPITAL_COMMUNITY): Payer: Self-pay | Admitting: Psychiatry

## 2015-12-29 VITALS — BP 128/72 | HR 80 | Ht 70.0 in | Wt 181.0 lb

## 2015-12-29 DIAGNOSIS — F411 Generalized anxiety disorder: Secondary | ICD-10-CM | POA: Diagnosis not present

## 2015-12-29 DIAGNOSIS — F319 Bipolar disorder, unspecified: Secondary | ICD-10-CM

## 2015-12-29 MED ORDER — LAMOTRIGINE 200 MG PO TABS: 200.0000 mg | ORAL_TABLET | Freq: Every day | ORAL | Status: DC

## 2015-12-29 MED ORDER — CLONAZEPAM 0.5 MG PO TABS: 0.5000 mg | ORAL_TABLET | Freq: Every day | ORAL | Status: DC

## 2015-12-29 MED ORDER — QUETIAPINE FUMARATE 25 MG PO TABS: 25.0000 mg | ORAL_TABLET | Freq: Every day | ORAL | Status: DC

## 2015-12-29 NOTE — Progress Notes (Signed)
[place] [phone] Progress Note  AKSHAT LITER [id number] 70 y.o.  12/29/2015 3:13 PM  Chief Complaint:  My wife died on 12/06/2022.  I am under a lot of stress.  I have issues with my son.  He pressed charges against me because I threatened him.  I don't recall that.  History of Present Illness:  Eldred came for his follow-up appointment.  He is complaining of increased depression and sadness and irritability.  On 06-Dec-2022 his wife died after prolonged competition of COPD.  He was hoping that she will discharge and come back to home but her condition is started to get worse.  Patient told that after that he was very upset and do not recall for the next few days.  Patient told his son, wife and daughter called the police and apparently accuses that he had threatened them.  Patient do not recall threatening them.  He admitted having issues with his son in the past.  Patient was separated from his wife but recently he is trying to establish his relationship and his wife agreed that she will return home once she discharged from the hospital.  Patient experienced severe grief and sadness.  He admitted lately feeling irritable, angry, crying spells, poor sleep and racing thoughts.  He tried Seroquel but it made him very sedated.  He admitted taking 4 Klonopin to get sleep and he ran out.  He has not taken Klonopin in past 4 days.  He apologizes for his taking Klonopin.  He told he was very distraught with the loss of his wife and then accusation by his son.  He has a court date on May 16.  He tried Seroquel 50 mg but continues to have excessive sedation and now he had tried 25 mg which helped some sleep.  He endorse some time having nightmares and flashback.  He admitted passive and fleeting suicidal thoughts and hopelessness but denies any plan or any intent.  Patient told he is a religious person and does not believe in suicide.  He wants to get better.  He started seeing Tharon Aquas every week  which is helping his depression.  He denies any paranoia or any hallucination.  His appetite is fair.  He denies drinking or using any illegal substances.  He is taking Lamictal which was increased on his last visit.  He has no rash, itching or any tremors or shakes.  Suicidal Ideation: No Plan Formed: No Patient has means to carry out plan: No  Homicidal Ideation: No Plan Formed: No Patient has means to carry out plan: No  Past Psychiatric History/Hospitalization(s): Patient has bipolar disorder and at least more than 15 psychiatric hospitalization.  Most of psychiatric hospitalization was due to mania, depression, suicidal attempt.  He has taken overdose on his psychiatric medication including Ambien, Vistaril and alcohol.  His last psychiatric hospitalization was in 2007 at [facility].  In the past he has taken Zyprexa, Cymbalta, Seroquel, Vistaril, Ambien, Neurontin , Wellbutrin and Trileptal however he do not remember the details very well.  He did remember lithium cause side effects.  He was seeing Dr. Letta Moynahan and Parthenia Ames.  Patient has history of mania, psychosis, hallucination and severe depression.   Anxiety: No Bipolar Disorder: Yes Depression: Yes Mania: Yes Psychosis: Yes Schizophrenia: No Personality Disorder: No Hospitalization for psychiatric illness: Yes History of Electroconvulsive Shock Therapy: No Prior Suicide Attempts: Yes  Medical History; Patient has asthma, chronic joint pain, hyperlipidemia, history of  chickenpox, headaches,.  He has history of nasal septum surgery, hernia repair, back surgery, elbow surgery and knee surgery.   Family History; Patient reported brother committed suicide and sister had attempted suicide and later died due to complication.      Substance Abuse History; Patient endorse history of drinking in the past but claims to be sober from heavy drinking in recent 5 years.  He admitted social drinking beer but denies any  intoxication or any binge.  He denies any illegal substance use.    Review of Systems  Constitutional: Negative.   Cardiovascular: Negative for chest pain and palpitations.  Musculoskeletal: Negative.   Skin: Negative for itching and rash.  Neurological: Negative for dizziness, tremors and headaches.  Psychiatric/Behavioral: Positive for depression. Negative for suicidal ideas, hallucinations and substance abuse. The patient is nervous/anxious.        Irritability, anger    Psychiatric: Agitation: No Hallucination: No Depressed Mood: Yes Insomnia: Yes Hypersomnia: No Altered Concentration: No Feels Worthless: Yes Grandiose Ideas: No Belief In Special Powers: No New/Increased Substance Abuse: No Compulsions: No  Neurologic: Headache: No Seizure: No Paresthesias: No   Outpatient Encounter Prescriptions as of 12/29/2015  Medication Sig  . acetaminophen-codeine (TYLENOL #3) 300-30 MG tablet Take 1 tablet by mouth every 6 (six) hours as needed for moderate pain.  Marland Kitchen CARTIA XT 180 MG 24 hr capsule   . clonazePAM (KLONOPIN) 0.5 MG tablet Take 1 tablet (0.5 mg total) by mouth at bedtime.  . clopidogrel (PLAVIX) 75 MG tablet Take 75 mg by mouth daily.  . clotrimazole-betamethasone (LOTRISONE) cream APPLY ONE CREAM TOPICALLY TWICE DAILY (Patient not taking: Reported on 12/03/2015)  . lamoTRIgine (LAMICTAL) 200 MG tablet Take 1 tablet (200 mg total) by mouth daily.  . nitroGLYCERIN (NITROSTAT) 0.3 MG SL tablet Place 0.3 mg under the tongue every 5 (five) minutes as needed for chest pain.  Marland Kitchen QUEtiapine (SEROQUEL) 25 MG tablet Take 1 tablet (25 mg total) by mouth at bedtime.  . rosuvastatin (CRESTOR) 20 MG tablet Take 20 mg by mouth daily.  Marland Kitchen triamcinolone cream (KENALOG) 0.1 % Apply 1 application topically 2 (two) times daily.  . [DISCONTINUED] clonazePAM (KLONOPIN) 0.5 MG tablet Take 1 tablet (0.5 mg total) by mouth 2 (two) times daily.  . [DISCONTINUED] lamoTRIgine (LAMICTAL) 150 MG  tablet Take 1 tablet (150 mg total) by mouth daily.  . [DISCONTINUED] QUEtiapine (SEROQUEL) 50 MG tablet Take 1 tablet (50 mg total) by mouth at bedtime.   No facility-administered encounter medications on file as of 12/29/2015.    No results found for this or any previous visit (from the past 2160 hour(s)).    Constitutional:  BP 128/72 mmHg  Pulse 80  Ht 5\' 10"  (1.778 m)  Wt 181 lb (82.101 kg)  BMI 25.97 kg/m2   Musculoskeletal: Strength & Muscle Tone: within normal limits Gait & Station: normal Patient leans: N/A  Psychiatric Specialty Exam: General Appearance: Casual  Eye Contact::  Fair  Speech:  Slow  Volume:  Decreased  Mood:  Anxious and Depressed  Affect:  Constricted and Depressed  Thought Process:  Circumstantial  Orientation:  Full (Time, Place, and Person)  Thought Content:  Rumination  Suicidal Thoughts:  No  Homicidal Thoughts:  No  Memory:  Immediate;   Fair Recent;   Fair Remote;   Fair  Judgement:  Fair  Insight:  Fair  Psychomotor Activity:  Decreased  Concentration:  Fair  Recall:  [employer] of Knowledge:  Fair  Language:  Fair  Akathisia:  No  Handed:  Right  AIMS (if indicated):     Assets:  Communication Skills Desire for Improvement Financial Resources/Insurance Housing Physical Health  ADL's:  Intact  Cognition:  WNL  Sleep:        Established Problem, Stable/Improving (1), Review of Psycho-Social Stressors (1), Review and summation of old records (2), Established Problem, Worsening (2), Review of Last Therapy Session (1), Review of Medication Regimen & Side Effects (2) and Review of New Medication or Change in Dosage (2)  Assessment: Axis I: Bipolar disorder, mixed.  Rule out posttraumatic stress disorder.  Rule out major depressive disorder with psychosis  Axis II: deferred   Axis III:  Past Medical History  Diagnosis Date  . Asthma   . Arthritis   . Chicken pox   . Depression   . Hyperlipidemia   . Migraines       Plan:  I review his psychosocial stressors , recent loss of his wife , Devin Going situation and his current medication.  I have a long discussion with the patient that he cannot abuse his Klonopin and he will take only as a prescribed.  I will reduce Klonopin tablet and he will take only 1 tablet at bedtime .  Encouraged to take Seroquel every night and we are going to decrease the dose of Seroquel 25 mg to avoid sedation.  I will also increase Lamictal 200 mg daily.  Patient apologizes for his behavior for taking more Klonopin than prescribed.  Discussed benzodiazepine dependence and tolerance..  I strongly encouraged him to contact hospice for grief counseling .  Numbers are provided.  Encouraged to see Tharon Aquas every week and if symptoms started to get worse or anytime having suicidal thoughts that he need to call 911 or go to the local emergency room.  Patient prefers Seroquel and he agreed to try low-dose to help sleep. Recommended to call us back if he feel worsening of the symptoms.  Follow-up in 4 weeks.    ARFEEN,SYED T., MD 12/29/2015

## 2015-12-30 ENCOUNTER — Encounter (HOSPITAL_COMMUNITY): Payer: Self-pay | Admitting: Clinical

## 2015-12-30 ENCOUNTER — Ambulatory Visit (INDEPENDENT_AMBULATORY_CARE_PROVIDER_SITE_OTHER): Payer: Medicare Other | Admitting: Clinical

## 2015-12-30 DIAGNOSIS — F4310 Post-traumatic stress disorder, unspecified: Secondary | ICD-10-CM | POA: Diagnosis not present

## 2015-12-30 DIAGNOSIS — F3162 Bipolar disorder, current episode mixed, moderate: Secondary | ICD-10-CM | POA: Diagnosis not present

## 2015-12-30 DIAGNOSIS — F4321 Adjustment disorder with depressed mood: Secondary | ICD-10-CM | POA: Diagnosis not present

## 2015-12-30 DIAGNOSIS — F440 Dissociative amnesia: Secondary | ICD-10-CM | POA: Diagnosis not present

## 2015-12-30 NOTE — Progress Notes (Signed)
   THERAPIST PROGRESS NOTE  Session Time: 9:05 -10:00  Participation Level: Active  Behavioral Response: CasualAlertDepressed  Type of Therapy: Individual Therapy  Treatment Goals addressed: improve psychiatric symptoms, , Moderated Mood, , Improve Unhelpful Thought Patterns, Emotional Regulation Skills , Feel and express a full Range of Emotions, Learn about Diagnosis, Healthy Coping Skills,    Interventions: CBT, Motivational Interviewing, grounding techniques and mindfulness techniques, and psychoeducation  Summary: Justin [name] is a 70 y.o. male who presents with Bipolar 1 Disorder, Mixed, Moderate and Dissociative Amnesia, Grief, and PTSD  Suicidal/Homicidal: No - without intent/plan  Therapist Response:  Justin [name] met with clinician for an individual session. Justin [name] discussed his psychiatric symptoms, his current life events and his homework. Justin [name] shared that he had been depressed some days and some manic days. He shared that he was sometimes able to use his skills to improve his symptoms and other times not. Client and clinician discussed the skills he used and ways to improve his practice. Justin [name] shared that  When he is gardening he feels calmer. Client and clinician discussed how keeping a schedule - planning activities etc. Could help improve all of his symptoms.Client and clinician discuss grief and how to feel his natural emotions. Justin [name] shared that he has found it helpful to talk to his wife's urn. He shared he is missing her deeply and finds comfort in sharing with her. Client and clinician discussed the possibility of attending a grief support group. He shared that he has had passive thoughts of not wanting to be here. Client and clinician discussed what inspires him to continue on. He shared that he hopes that as time goes on and he makes improvements in his mental health he will be able to repair the relationship with his son and be able to be a part of his grandchildren's  life. He shared that he also has a dog that comforts him. Justin [name] shared that he blacked out and fell on the floor when getting out of bed one of the days. Clinician encouraged him to speak to the doctor about this. Clinician asked about his eating which he stated could improve. Clinician asked open ended questions and Justin [name] identified ways he could improve his eating and daily patterns. Justin [name] had completed bipolar packet 1&2, client and clinician reviewed and discussed his homework. Justin [name] agreed to complete packet 3, continue to practice his grounding and mindfulness techniques, and to make an effort to do at least 1 thing a day that improved his mood ( gardening, playing with dog, going for a drive.)  Plan: Return again in 1 -2 weeks.  Diagnosis:     Axis I: Bipolar 1 Disorder, Mixed, Moderate and Dissociative Amnesia, Grief, and PTSD     Queen Abbett A, LCSW 12/30/2015

## 2016-01-01 ENCOUNTER — Encounter: Payer: Self-pay | Admitting: Family

## 2016-01-01 ENCOUNTER — Ambulatory Visit (INDEPENDENT_AMBULATORY_CARE_PROVIDER_SITE_OTHER): Payer: Medicare Other | Admitting: Family

## 2016-01-01 VITALS — BP 130/90 | HR 72 | Temp 97.4°F | Ht 72.0 in | Wt 179.0 lb

## 2016-01-01 DIAGNOSIS — B354 Tinea corporis: Secondary | ICD-10-CM

## 2016-01-01 DIAGNOSIS — M25561 Pain in right knee: Secondary | ICD-10-CM | POA: Diagnosis not present

## 2016-01-01 DIAGNOSIS — R21 Rash and other nonspecific skin eruption: Secondary | ICD-10-CM | POA: Diagnosis not present

## 2016-01-01 MED ORDER — CLOTRIMAZOLE-BETAMETHASONE 1-0.05 % EX CREA: TOPICAL_CREAM | CUTANEOUS | Status: DC

## 2016-01-01 MED ORDER — ACETAMINOPHEN-CODEINE #3 300-30 MG PO TABS: 1.0000 | ORAL_TABLET | Freq: Four times a day (QID) | ORAL | Status: DC | PRN

## 2016-01-01 NOTE — Assessment & Plan Note (Signed)
Rash consistent with tinea corporis. Start Lotrisone. Follow up if symptoms worsen or do not improve.

## 2016-01-01 NOTE — Patient Instructions (Signed)
Thank you for choosing [facility].  Summary/Instructions:  Ice 2-3 times per day and and after activity. Exercises daily.  Your prescription(s) have been submitted to your pharmacy or been printed and provided for you. Please take as directed and contact our office if you believe you are having problem(s) with the medication(s) or have any questions.  If your symptoms worsen or fail to improve, please contact our office for further instruction, or in case of emergency go directly to the emergency room at the closest medical facility.    Body Ringworm Ringworm (tinea corporis) is a fungal infection of the skin on the body. This infection is not caused by worms, but is actually caused by a fungus. Fungus normally lives on the top of your skin and can be useful. However, in the case of ringworms, the fungus grows out of control and causes a skin infection. It can involve any area of skin on the body and can spread easily from one person to another (contagious). Ringworm is a common problem for children, but it can affect adults as well. Ringworm is also often found in athletes, especially wrestlers who share equipment and mats.  CAUSES  Ringworm of the body is caused by a fungus called dermatophyte. It can spread by:  Touchingother people who are infected.  Touchinginfected pets.  Touching or sharingobjects that have been in contact with the infected person or pet (hats, combs, towels, clothing, sports equipment). SYMPTOMS   Itchy, raised red spots and bumps on the skin.  Ring-shaped rash.  Redness near the border of the rash with a clear center.  Dry and scaly skin on or around the rash. Not every person develops a ring-shaped rash. Some develop only the red, scaly patches. DIAGNOSIS  Most often, ringworm can be diagnosed by performing a skin exam. Your caregiver may choose to take a skin scraping from the affected area. The sample will be examined under the microscope to  see if the fungus is present.  TREATMENT  Body ringworm may be treated with a topical antifungal cream or ointment. Sometimes, an antifungal shampoo that can be used on your body is prescribed. You may be prescribed antifungal medicines to take by mouth if your ringworm is severe, keeps coming back, or lasts a long time.  HOME CARE INSTRUCTIONS   Only take over-the-counter or prescription medicines as directed by your caregiver.  Wash the infected area and dry it completely before applying yourcream or ointment.  When using antifungal shampoo to treat the ringworm, leave the shampoo on the body for 3-5 minutes before rinsing.   Wear loose clothing to stop clothes from rubbing and irritating the rash.  Wash or change your bed sheets every night while you have the rash.  Have your pet treated by your veterinarian if it has the same infection. To prevent ringworm:   Practice good hygiene.  Wear sandals or shoes in public places and showers.  Do not share personal items with others.  Avoid touching red patches of skin on other people.  Avoid touching pets that have bald spots or wash your hands after doing so. SEEK MEDICAL CARE IF:   Your rash continues to spread after 7 days of treatment.  Your rash is not gone in 4 weeks.  The area around your rash becomes red, warm, tender, and swollen.   This information is not intended to replace advice given to you by your health care provider. Make sure you discuss any questions you have with  your health care provider.   Document Released: 08/05/2000 Document Revised: 05/02/2012 Document Reviewed: 02/20/2012 Elsevier Interactive Patient Education [employer].

## 2016-01-01 NOTE — Progress Notes (Signed)
Pre visit review using our clinic review tool, if applicable. No additional management support is needed unless otherwise documented below in the visit note. 

## 2016-01-01 NOTE — Progress Notes (Addendum)
Subjective:    Patient ID: Justin [name], male    DOB: March 21, 1946, 70 y.o.   MRN: [id number]  Chief Complaint  Patient presents with  . Rash  . Knee Pain    HPI:  Justin [name] is a 70 y.o. male who  has a past medical history of Asthma; Arthritis; Chicken pox; Depression; Hyperlipidemia; and Migraines. and presents today for a follow up office visit.   1.) Rash - Previously seen for a rash and treated with triamcinolone cream. Reports initial improvement and now worsening. Associated symptom of a rash is located on the left side of his buttock. Described as red and itchy. Indicates that it has spread.   2.) Knee pain - This is a new problem. Associated symptom of pain located in his right knee has been going on for a couple of weeks and has progressively worsening. Aggravated by going down steps. Pain is described as an achy. Modifying factors include Aleve which did not help very much. Knee pain severity is enough to change his gait. Previous arthroscopy with no significant findings and mild cartilage damage. Does have an occasional popping sound/sensation.   Allergies  Allergen Reactions  . Ambien [Zolpidem Tartrate] Other (See Comments)    Unknown reaction  . Codeine     Other reaction(s): Other (See Comments) Memory issues     Current Outpatient Prescriptions on File Prior to Visit  Medication Sig Dispense Refill  . CARTIA XT 180 MG 24 hr capsule     . clonazePAM (KLONOPIN) 0.5 MG tablet Take 1 tablet (0.5 mg total) by mouth at bedtime. 30 tablet 0  . clopidogrel (PLAVIX) 75 MG tablet Take 75 mg by mouth daily.    Marland Kitchen lamoTRIgine (LAMICTAL) 200 MG tablet Take 1 tablet (200 mg total) by mouth daily. 30 tablet 0  . nitroGLYCERIN (NITROSTAT) 0.3 MG SL tablet Place 0.3 mg under the tongue every 5 (five) minutes as needed for chest pain.    Marland Kitchen QUEtiapine (SEROQUEL) 25 MG tablet Take 1 tablet (25 mg total) by mouth at bedtime. 30 tablet 0  . rosuvastatin (CRESTOR) 20 MG tablet  Take 20 mg by mouth daily.     No current facility-administered medications on file prior to visit.    Past Medical History  Diagnosis Date  . Asthma   . Arthritis   . Chicken pox   . Depression   . Hyperlipidemia   . Migraines      Past Surgical History  Procedure Laterality Date  . Nasal septum surgery    . Hernia repair    . Neck surgery    . Elbow surgery Left   . Knee surgery Bilateral     Review of Systems  Constitutional: Negative for fever and chills.  Musculoskeletal:       Positive for right knee pain.  Skin: Positive for rash.  Neurological: Negative for weakness and numbness.      Objective:    BP 130/90 mmHg  Pulse 72  Temp(Src) 97.4 F (36.3 C) (Oral)  Ht 6' (1.829 m)  Wt 179 lb (81.194 kg)  BMI 24.27 kg/m2  SpO2 99% Nursing note and vital signs reviewed.  Physical Exam  Constitutional: He is oriented to person, place, and time. He appears well-developed and well-nourished. No distress.  Cardiovascular: Normal rate, regular rhythm, normal heart sounds and intact distal pulses.   Pulmonary/Chest: Effort normal and breath sounds normal.  Musculoskeletal:  Right knee - no obvious deformity, discoloration, or edema.  Mild tenderness elicited along medial and  lateral joint lines. Range of motion within normal limits. Strength is 4+ out of 5. Distal pulses and sensation are intact and appropriate. Ligamentous and meniscal testing are negative.  Neurological: He is alert and oriented to person, place, and time.  Skin: Skin is warm and dry.  Irregularly shaped red, scaly rash with defined borders and clear center located in his left lower buttock.   Psychiatric: He has a normal mood and affect. His behavior is normal. Judgment and thought content normal.   Procedure: Corticosteriod injection of the right knee  Description: Informed consent was obtained with discussion including risks and benefits of the procedure. Patient verbally wished to continued.  Time out was performed. The site was marked and identified. It was cleansed with betadine using a concentric circular pattern. Skin anesthesia was applied using cold spray applied for 10 seconds. Injection of 1:4 of Kenalog to Sensoricaine was injected following aspiration with no return noted. Following the injection there was instant relief confirming appropriate placement. A bandage was applied to the area and post care instructions were provided. The procedure was tolerated well with no complications.      Assessment & Plan:   Problem List Items Addressed This Visit      Musculoskeletal and Integument   Rash and nonspecific skin eruption    Rash consistent with tinea corporis. Start Lotrisone. Follow up if symptoms worsen or do not improve.       Relevant Medications   clotrimazole-betamethasone (LOTRISONE) cream     Other   Right knee pain - Primary    Right knee pain consistent with possible osteoarthritis or chondromalcia. Cortisone injection given today without complication. Ice and home exercise therapy. Start Tylenol #3 as needed for discomfort. Follow up if symptoms worsen or do not improve.       Relevant Medications   acetaminophen-codeine (TYLENOL #3) 300-30 MG tablet       I have discontinued Mr. Severs's triamcinolone cream. I am also having him maintain his rosuvastatin, clopidogrel, nitroGLYCERIN, CARTIA XT, QUEtiapine, lamoTRIgine, clonazePAM, clotrimazole-betamethasone, and acetaminophen-codeine.   Meds ordered this encounter  Medications  . clotrimazole-betamethasone (LOTRISONE) cream    Sig: APPLY ONE CREAM TOPICALLY TWICE DAILY    Dispense:  15 g    Refill:  0    Order Specific Question:  Supervising Provider    Answer:  Pricilla Holm A [id number]  . acetaminophen-codeine (TYLENOL #3) 300-30 MG tablet    Sig: Take 1 tablet by mouth every 6 (six) hours as needed for moderate pain.    Dispense:  30 tablet    Refill:  0    Order Specific Question:   Supervising Provider    Answer:  Pricilla Holm A [id number]     Follow-up: Return in about 3 weeks (around 01/22/2016).  Mauricio Po, FNP

## 2016-01-01 NOTE — Assessment & Plan Note (Signed)
Right knee pain consistent with possible osteoarthritis or chondromalcia. Cortisone injection given today without complication. Ice and home exercise therapy. Start Tylenol #3 as needed for discomfort. Follow up if symptoms worsen or do not improve.

## 2016-01-05 ENCOUNTER — Ambulatory Visit (INDEPENDENT_AMBULATORY_CARE_PROVIDER_SITE_OTHER): Payer: Medicare Other | Admitting: Clinical

## 2016-01-05 DIAGNOSIS — F4321 Adjustment disorder with depressed mood: Secondary | ICD-10-CM | POA: Diagnosis not present

## 2016-01-05 DIAGNOSIS — F3162 Bipolar disorder, current episode mixed, moderate: Secondary | ICD-10-CM | POA: Diagnosis not present

## 2016-01-05 DIAGNOSIS — F440 Dissociative amnesia: Secondary | ICD-10-CM

## 2016-01-05 DIAGNOSIS — F4310 Post-traumatic stress disorder, unspecified: Secondary | ICD-10-CM | POA: Diagnosis not present

## 2016-01-05 NOTE — Progress Notes (Signed)
   THERAPIST PROGRESS NOTE  Session Time: 1:32 - 2:28  Participation Level: Active  Behavioral Response: CasualAlertDepressed  Type of Therapy: Individual Therapy  Treatment Goals addressed: improve psychiatric symptoms, Controlled Behavior, Moderated Mood, Deliberative Speech (improved social functioning), Improve Unhelpful Thought Patterns, Emotional Regulation Skills (Moderate moods, stress management), Feel and express [name] full Range of Emotions, Learn about Diagnosis, Healthy Coping Skills, Recall the Traumatic event without being overwhelmed    Interventions: CBT, Motivational Interviewing,  and psychoeducation  Summary: Justin [name] is [name] 70 y.o. male who presents with Bipolar 1 Disorder, Mixed, Moderate and Dissociative Amnesia, Grief, and PTSD  Suicidal/Homicidal: No - without intent/plan  Therapist Response:  Justin [name] met with clinician for an individual session. Justin [name] discussed his psychiatric symptoms, his current life events and his homework. Justin [name] share that he experienced mixed emotions - [name] lot of depression and times of calmness. He shared that last week was "rough." He shared that he was upset with his son - promising that he would bring the grandchildren over and then only allowing them 15 minutes together. He shared that it is his son's birthday tomorrow and he would like to wish him happy birthday but is concerned that he will be rejected. Client and clinician discussed Justin [name]'s long term gaol - to rebuild his family relationships (improved social functioning). Client and clinician discussed Justin [name] emotions and negative thoughts. Client and clinician discussed the likely outcome  Of reacting from his negative thoughts, client and clinician discussed healthier alternative thoughts and his likely reaction if he believe them.Justin [name] shared that he has an appointment with hospice to discuss the possibility of Grief counseling. Client and clinician also discussed the  possibility of him attending IOP. His symptoms are pretty intense and he might benefit from the classes and added structure. Clinician put him in contact with IOP therapist (after session) to find out if that would be [name] good match. Justin [name] is aware if he goes to IOP he can return to therapist after. Justin [name] and clinician discussed the coping skills he used over the week. He shared that he did some gardening which helped him focus and take his mind off of grief for [name] bit. He shared that he practiced his grounding techniques and found them helpful in distracting from negative thought patterns and his thoughts of  Wishing he wasn't here. Justin [name] also shared that he took [name] reminder of his wife and his dog for [name] drive. He shared that he experienced [name] lot of good memories and also some grief. Client and clinician discussed allowing for natural emotions - both nice and difficult. Client and clinician discussed the purpose of emotional regulation skills - to allow for natural emotions and also to prevent feeling overwhelmed.  Justin [name] shared that he had completed his homework packet #3 bipolar but did not bring it back with him. Client and clinician discussed it briefly and Justin [name] agreed to bring it and his homework assigned this week (bipolar #4) Justin [name] agreed to continue his healthy coping and practicing his grounding and mindfulness techniques.     Plan: Return again in 1 -2 weeks.  Diagnosis:     Axis I: Bipolar 1 Disorder, Mixed, Moderate and Dissociative Amnesia, Grief, and PTSD   Justin Marner A, LCSW 01/05/2016

## 2016-01-07 ENCOUNTER — Encounter (HOSPITAL_COMMUNITY): Payer: Self-pay | Admitting: Clinical

## 2016-01-07 ENCOUNTER — Ambulatory Visit: Payer: Self-pay

## 2016-01-13 ENCOUNTER — Other Ambulatory Visit (HOSPITAL_COMMUNITY): Payer: 59 | Attending: Psychiatry | Admitting: Psychiatry

## 2016-01-13 ENCOUNTER — Encounter (HOSPITAL_COMMUNITY): Payer: Self-pay | Admitting: Psychiatry

## 2016-01-13 DIAGNOSIS — F3160 Bipolar disorder, current episode mixed, unspecified: Secondary | ICD-10-CM
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
## 2016-01-14 ENCOUNTER — Encounter: Payer: Self-pay | Admitting: Family

## 2016-01-14 ENCOUNTER — Ambulatory Visit (INDEPENDENT_AMBULATORY_CARE_PROVIDER_SITE_OTHER): Payer: Medicare Other | Admitting: Family

## 2016-01-14 ENCOUNTER — Ambulatory Visit (HOSPITAL_COMMUNITY): Payer: Self-pay | Admitting: Clinical

## 2016-01-14 ENCOUNTER — Other Ambulatory Visit (HOSPITAL_COMMUNITY): Payer: 59 | Admitting: Psychiatry

## 2016-01-14 VITALS — BP 158/90 | HR 66 | Temp 98.3°F | Resp 14 | Ht 72.0 in | Wt 177.0 lb

## 2016-01-14 DIAGNOSIS — M7021 Olecranon bursitis, right elbow: Secondary | ICD-10-CM

## 2016-01-14 DIAGNOSIS — M25561 Pain in right knee: Secondary | ICD-10-CM | POA: Diagnosis not present

## 2016-01-14 MED ORDER — ACETAMINOPHEN-CODEINE #3 300-30 MG PO TABS: 1.0000 | ORAL_TABLET | Freq: Four times a day (QID) | ORAL | Status: DC | PRN

## 2016-01-14 NOTE — Assessment & Plan Note (Signed)
Right knee pain with concern for costochondral or possible osteoarthritis refractory to cortisone injection. Continue home exercise therapy and ice. Refer to orthopedics for further assessment and management and possible MRI. Refill Tylenol 3 as needed for discomfort.

## 2016-01-14 NOTE — Assessment & Plan Note (Signed)
Continues to experience olecranon bursitis of the right elbow previously drained with no cortisone placed. Given continued discomfort, will refer to orthopedics for further assessment and management.

## 2016-01-14 NOTE — Progress Notes (Signed)
Pre visit review using our clinic review tool, if applicable. No additional management support is needed unless otherwise documented below in the visit note. 

## 2016-01-14 NOTE — Patient Instructions (Signed)
Thank you for choosing [facility].  Summary/Instructions:  Your prescription(s) have been submitted to your pharmacy or been printed and provided for you. Please take as directed and contact our office if you believe you are having problem(s) with the medication(s) or have any questions.  If your symptoms worsen or fail to improve, please contact our office for further instruction, or in case of emergency go directly to the emergency room at the closest medical facility.    Elbow Bursitis Elbow bursitis is inflammation of the fluid-filled sac (bursa) between the tip of your elbow bone (olecranon) and your skin. Elbow bursitis may also be called olecranon bursitis. Normally, the olecranon bursa has only a small amount of fluid in it to cushion and protect your elbow bone. Elbow bursitis causes fluid to build up inside the bursa. Over time, this swelling and inflammation can cause pain when you bend or lean on your elbow.  CAUSES Elbow bursitis may be caused by:   Elbow injury (acute trauma).  Leaning on hard surfaces for long periods of time.  Infection from an injury that breaks the skin near your elbow.  A bone growth (spur) that forms at the tip of your elbow.  A medical condition that causes inflammation in your body, such as gout or rheumatoid arthritis.  The cause may also be unknown.  SIGNS AND SYMPTOMS  The first sign of elbow bursitis is usually swelling over the tip of your elbow. This can grow to be the size of a golf ball. This may start suddenly or develop gradually. You may also have:  Pain when bending or leaning on your elbow.  Restricted movement of your elbow.  If your bursitis is caused by an infection, symptoms may also include:  Redness, warmth, and tenderness of the elbow.  Drainage of pus from the swollen area over your elbow, if the skin breaks open. DIAGNOSIS  Your health care provider may be able to diagnose elbow bursitis based on your signs  and symptoms, especially if you have recently been injured. Your health care provider will also do a physical exam. This may include:  X-rays to look for a bone spur or a bone fracture.  Draining fluid from the bursa to test it for infection.  Blood tests to rule out gout or rheumatoid arthritis. TREATMENT  Treatment for elbow bursitis depends on the cause. Treatment may include:  Medicines. These may include:  Over-the-counter medicines to relieve pain and inflammation.  Antibiotic medicines to fight infection.  Injections of anti-inflammatory medicines (steroids).  Wrapping your elbow with a bandage.  Draining fluid from the bursa.  Wearing elbow pads.  If your bursitis does not get better with treatment, surgery may be needed to remove the bursa.  HOME CARE INSTRUCTIONS   Take medicines only as directed by your health care provider.  If you were prescribed an antibiotic medicine, finish all of it even if you start to feel better.  If your bursitis is caused by an injury, rest your elbow and wear your bandage as directed by your health care provider. You may alsoapply ice to the injured area as directed by your health care provider:  Put ice in a plastic bag.  Place a towel between your skin and the bag.  Leave the ice on for 20 minutes, 2-3 times per day.  Avoid any activities that cause elbow pain.  Use elbow pads or elbow wraps to cushion your elbow. SEEK MEDICAL CARE IF:  You have a fever.  Your symptoms do not get better with treatment.  Your pain or swelling gets worse.  Your elbow pain or swelling goes away and then returns.  You have drainage of pus from the swollen area over your elbow.   This information is not intended to replace advice given to you by your health care provider. Make sure you discuss any questions you have with your health care provider.   Document Released: 09/07/2006 Document Revised: 08/29/2014 Document Reviewed:  04/16/2014 Elsevier Interactive Patient Education [employer].

## 2016-01-14 NOTE — Progress Notes (Signed)
    Daily Group Progress Note  Program: IOP  Group Time: 9:00-12:00  Participation Level: Minimal  Behavioral Response: Appropriate  Type of Therapy:  Group Therapy  Summary of Progress: Pt. Presented as quiet, tearful, depressed. Pt. Participated in guided meditation for the first 15 minutes of group. Pt. Met with case manager and psychiatrist for the remainder of group.     Nancie Neas, LPC

## 2016-01-14 NOTE — Progress Notes (Signed)
Subjective:    Patient ID: Justin [name], male    DOB: 1946/08/10, 70 y.o.   MRN: [id number]  Chief Complaint  Patient presents with  . Knee Pain    having pain in his right knee that is still bothering him after the cortisone shot and also having issues with bursitis in right elbow    HPI:  Justin [name] is a 70 y.o. male who  has a past medical history of Asthma; Arthritis; Chicken pox; Depression; Hyperlipidemia; and Migraines. and presents today For an acute office visit.  1.) Right knee pain - previously seen in the office for right knee pain with concern for osteoarthritis and provided a cortisone injection. Continues to experience pain located in his right knee that was unrelieved by the cortisone injection. Pain continues to be generalized around the right knee with concern for chondral pathology.  2.) Olecranon bursitis  - previously diagnosed with olecranon bursitis and drained in the office with no cortisone. Continues to have the associated symptom of pain located in his right elbow consistent with bursitis which is continuing to fill with fluid. There are no modifying factors that make it better. Denies any trauma, numbness or tingling. Aggravated when he leans on it or bumps into things.    Allergies  Allergen Reactions  . Ambien [Zolpidem Tartrate] Other (See Comments)    Unknown reaction  . Codeine     Other reaction(s): Other (See Comments) Memory issues     Current Outpatient Prescriptions on File Prior to Visit  Medication Sig Dispense Refill  . CARTIA XT 180 MG 24 hr capsule     . clonazePAM (KLONOPIN) 0.5 MG tablet Take 1 tablet (0.5 mg total) by mouth at bedtime. 30 tablet 0  . clopidogrel (PLAVIX) 75 MG tablet Take 75 mg by mouth daily.    . clotrimazole-betamethasone (LOTRISONE) cream APPLY ONE CREAM TOPICALLY TWICE DAILY 15 g 0  . lamoTRIgine (LAMICTAL) 200 MG tablet Take 1 tablet (200 mg total) by mouth daily. 30 tablet 0  . nitroGLYCERIN (NITROSTAT)  0.3 MG SL tablet Place 0.3 mg under the tongue every 5 (five) minutes as needed for chest pain.    Marland Kitchen QUEtiapine (SEROQUEL) 25 MG tablet Take 1 tablet (25 mg total) by mouth at bedtime. 30 tablet 0  . rosuvastatin (CRESTOR) 20 MG tablet Take 20 mg by mouth daily.     No current facility-administered medications on file prior to visit.    Review of Systems  Constitutional: Negative for fever and chills.  Musculoskeletal:       Positive for right knee and right elbow pain.  Neurological: Negative for weakness and numbness.      Objective:    BP 158/90 mmHg  Pulse 66  Temp(Src) 98.3 F (36.8 C) (Oral)  Resp 14  Ht 6' (1.829 m)  Wt 177 lb (80.287 kg)  BMI 24.00 kg/m2  SpO2 97% Nursing note and vital signs reviewed.  Physical Exam  Constitutional: He is oriented to person, place, and time. He appears well-developed and well-nourished. No distress.  Cardiovascular: Normal rate, regular rhythm, normal heart sounds and intact distal pulses.   Pulmonary/Chest: Effort normal and breath sounds normal.  Musculoskeletal:  Right knee - no obvious deformity, discoloration, or edema. Mild tenderness elicited along medial and lateral joint lines. Range of motion within normal limits. Strength is 4+ out of 5. Distal pulses and sensation are intact and appropriate. Ligamentous and meniscal testing are negative.   Right elbow -  inflamed olecranon bursa with no other edema or deformity. No discoloration. Tenderness elicited along the olecranon bursa and olecranon process. Range of motion slightly decreased in full flexion secondary to discomfort. Distal pulses and sensation are intact and appropriate.  Neurological: He is alert and oriented to person, place, and time.  Skin: Skin is warm and dry.  Psychiatric: He has a normal mood and affect. His behavior is normal. Judgment and thought content normal.       Assessment & Plan:   Problem List Items Addressed This Visit      Musculoskeletal and  Integument   Olecranon bursitis of right elbow    Continues to experience olecranon bursitis of the right elbow previously drained with no cortisone placed. Given continued discomfort, will refer to orthopedics for further assessment and management.      Relevant Orders   AMB referral to orthopedics     Other   Right knee pain - Primary    Right knee pain with concern for costochondral or possible osteoarthritis refractory to cortisone injection. Continue home exercise therapy and ice. Refer to orthopedics for further assessment and management and possible MRI. Refill Tylenol 3 as needed for discomfort.      Relevant Medications   acetaminophen-codeine (TYLENOL #3) 300-30 MG tablet   Other Relevant Orders   AMB referral to orthopedics       I am having Mr. Golonka maintain his rosuvastatin, clopidogrel, nitroGLYCERIN, CARTIA XT, QUEtiapine, lamoTRIgine, clonazePAM, clotrimazole-betamethasone, and acetaminophen-codeine.   Meds ordered this encounter  Medications  . acetaminophen-codeine (TYLENOL #3) 300-30 MG tablet    Sig: Take 1 tablet by mouth every 6 (six) hours as needed for moderate pain.    Dispense:  30 tablet    Refill:  0    Order Specific Question:  Supervising Provider    Answer:  Pricilla Holm A [id number]     Follow-up: Return if symptoms worsen or fail to improve.  Mauricio Po, FNP

## 2016-01-15 ENCOUNTER — Other Ambulatory Visit (HOSPITAL_COMMUNITY): Payer: 59 | Admitting: Psychiatry

## 2016-01-15 DIAGNOSIS — F3160 Bipolar disorder, current episode mixed, unspecified: Secondary | ICD-10-CM

## 2016-01-15 NOTE — Progress Notes (Signed)
    Daily Group Progress Note  Program: IOP  Group Time: 900-12:00  Participation Level: Active  Behavioral Response: Appropriate  Type of Therapy:  Group Therapy  Summary of Progress: Pt. Presented as soft spoken, tearful. Pt. Shared the recent losses of his sister and his wife. Pt. Shared that he has not been able to grieve loss of his wife until few weeks ago and the pain has been overwhelming for him. Pt. Shared his guilt for not being present in the hospital when he died and responding involuntarily with anger towards his family. Pt. Discussed his dog who is primary source of emotional support. Pt. Participated in discussion about normalizing the grief process and range of emotions involved.    Eloise Levels, Ph.D., [facility]

## 2016-01-19 ENCOUNTER — Other Ambulatory Visit (HOSPITAL_COMMUNITY): Payer: 59 | Admitting: Psychiatry

## 2016-01-19 DIAGNOSIS — F319 Bipolar disorder, unspecified: Secondary | ICD-10-CM

## 2016-01-19 DIAGNOSIS — F3160 Bipolar disorder, current episode mixed, unspecified: Secondary | ICD-10-CM | POA: Diagnosis not present

## 2016-01-19 NOTE — Progress Notes (Signed)
    Daily Group Progress Note  Program: IOP  Group Time: 10:30-12:00pm  Participation Level: Active  Behavioral Response: Appropriate  Type of Therapy:  Group Therapy  Summary of Progress: Pt presented depressed with flat affect. This was the first holiday without his wife, who recently passed away. Since her passing he and his son have become estranged. Pt appears lost within his grief and then becomes angry. He knows he hasn't dealt with her loss, but doesn't know where to start. Other group members gave him suggestions of moving forward in his grief. He was taught as a young child not to cry so he struggles with openly showing his emotions. Pt's first step is to allow himself to feel his emotions. Pt reported he will work on identifying what he is feeling and try to work through it, it's a process. Pt was receptive to suggestion and feedback.      MACKENZIE,LISBETH S, Licensed Cli

## 2016-01-20 ENCOUNTER — Telehealth (HOSPITAL_COMMUNITY): Payer: Self-pay | Admitting: Psychiatry

## 2016-01-20 ENCOUNTER — Other Ambulatory Visit (HOSPITAL_COMMUNITY): Payer: 59 | Admitting: Psychiatry

## 2016-01-20 ENCOUNTER — Telehealth: Payer: Self-pay | Admitting: *Deleted

## 2016-01-20 ENCOUNTER — Other Ambulatory Visit: Payer: Self-pay | Admitting: *Deleted

## 2016-01-20 MED ORDER — TRIAMCINOLONE ACETONIDE 0.1 % EX CREA
TOPICAL_CREAM | CUTANEOUS | Status: DC
Start: 2016-01-20 — End: 2016-05-27

## 2016-01-20 NOTE — Progress Notes (Signed)
    Daily Group Progress Note  Program: IOP  Group Time: 9:00-10:30  Participation Level: Active  Behavioral Response: Appropriate  Type of Therapy:  Group Therapy  Summary of Progress: Pt. Presented as quiet and depressed. Pt. Shared that he continues to process grief related to death of his wife and sadness and disappointment related to poor relationship and lack of support from his son.    Nancie Neas, LPC

## 2016-01-20 NOTE — Telephone Encounter (Signed)
D:  Pt didn't attend MH-IOP today due to lack of sleep lastnight.  Plans to return tomorrow; but will need to leave early due to a doctor's appointment.  A:  Inform treatment team.

## 2016-01-20 NOTE — Telephone Encounter (Signed)
Receive call pt is requesting refill on triamcinolone cream. Verified pharmacy inform pt will send to [facility]...[name]

## 2016-01-21 ENCOUNTER — Other Ambulatory Visit (HOSPITAL_COMMUNITY): Payer: 59 | Attending: Psychiatry | Admitting: Psychiatry

## 2016-01-21 DIAGNOSIS — F319 Bipolar disorder, unspecified: Secondary | ICD-10-CM | POA: Diagnosis present

## 2016-01-22 ENCOUNTER — Other Ambulatory Visit (HOSPITAL_COMMUNITY): Payer: 59 | Admitting: Psychiatry

## 2016-01-22 DIAGNOSIS — F319 Bipolar disorder, unspecified: Secondary | ICD-10-CM

## 2016-01-22 NOTE — Progress Notes (Signed)
    Daily Group Progress Note  Program: IOP  Group Time: 9:00-12:00   Participation Level:  active   Behavioral Response:  engaged   Type of Therapy:   group therapy   Summary of Progress: Client discussed sleep issues which he was encouraged to speak to the doctor about.  Rising with the sun and going to sleep at sunset was suggested to help get client's circadian rhythms on track.  Client is working on mending his relationship with his son.  Nancie Neas, LPC

## 2016-01-22 NOTE — Progress Notes (Signed)
    Daily Group Progress Note  Program: IOP  Group Time: 10:30-12:00  Participation Level: Active  Behavioral Response: Appropriate  Type of Therapy:  Group Therapy  Summary of Progress: Pt. Was alert and engaged in the group process. Reported that he was "ok". Pt. Shared that he was in minimal pain due to outpatient procedure on his elbow. Pt. Shared the difficulty that he has had in the past forming friendships and that he was encouraged by the group's acceptance of him and his ability to share in group.     Group Time: 10:30-12:00  Participation Level:  Active  Behavioral Response: Appropriate  Type of Therapy: Psycho-education Group  Summary of Progress: Pt. Participated in grief and loss group with the Chaplain.  Nancie Neas, LPC

## 2016-01-25 ENCOUNTER — Other Ambulatory Visit (HOSPITAL_COMMUNITY): Payer: 59 | Admitting: Psychiatry

## 2016-01-26 ENCOUNTER — Other Ambulatory Visit (HOSPITAL_COMMUNITY): Payer: 59

## 2016-01-27 ENCOUNTER — Other Ambulatory Visit (HOSPITAL_BASED_OUTPATIENT_CLINIC_OR_DEPARTMENT_OTHER): Payer: 59 | Admitting: Licensed Clinical Social Worker

## 2016-01-27 DIAGNOSIS — F319 Bipolar disorder, unspecified: Secondary | ICD-10-CM | POA: Diagnosis not present

## 2016-01-27 NOTE — Progress Notes (Signed)
    Daily Group Progress Note  Program: IOP  Group Time: 9:00-10:30  Participation Level: Active  Behavioral Response: Appropriate  Type of Therapy:  Group Therapy  Summary of Progress: Client reported that his depression had been very hard the last few days. He reported that his wife had passed away four months ago, and that he was not any better. He said that dealing with depression and grief was just really hard for him. Therapist discussed that grief and loss does not have a certain timetable, and that it takes time. Therapist also addressed that depression is chronic, and that it will be worse at certain times than others. Client was also encouraged to volunteer, and serve somewhere in the community to gain a sense of purpose.      Eloise Levels, Ph.D., [facility]

## 2016-01-27 NOTE — Progress Notes (Signed)
Daily Group Progress Note  Program: IOP  Group Time: 10:45-12:00pm  Participation Level: Active  Behavioral Response: Appropriate  Type of Therapy:  Group Therapy  Summary of Progress: Pt presented as quietly subdued during group process. Pt participated in a discussion about self-esteem. Pt was able to identify, through an activity, his overall subjective emotional evaluation of his own worth. Pt was encouraged to continue to work on her self esteem by using positive affirmations.

## 2016-01-28 ENCOUNTER — Other Ambulatory Visit (HOSPITAL_COMMUNITY): Payer: 59 | Admitting: Psychiatry

## 2016-01-28 DIAGNOSIS — F319 Bipolar disorder, unspecified: Secondary | ICD-10-CM
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
## 2016-01-29 ENCOUNTER — Other Ambulatory Visit (HOSPITAL_COMMUNITY): Payer: 59 | Admitting: Psychiatry

## 2016-01-29 ENCOUNTER — Ambulatory Visit (HOSPITAL_COMMUNITY): Payer: Self-pay | Admitting: Psychiatry

## 2016-01-29 DIAGNOSIS — F319 Bipolar disorder, unspecified: Secondary | ICD-10-CM | POA: Diagnosis not present

## 2016-02-01 ENCOUNTER — Other Ambulatory Visit (HOSPITAL_COMMUNITY): Payer: 59 | Admitting: Psychiatry

## 2016-02-01 DIAGNOSIS — F319 Bipolar disorder, unspecified: Secondary | ICD-10-CM | POA: Diagnosis not present

## 2016-02-01 NOTE — Progress Notes (Signed)
    Daily Group Progress Note  Program: IOP  Group Time: 9:00-12:00  Participation Level: Active  Behavioral Response: Appropriate  Type of Therapy:  Group Therapy  Summary of Progress: Patient discussed reaching out to family for a family function.  Patient was able to acknowledge what was healthy for him and found a solution to be able to celebrate family birthday without being more anxious and uncomfortable in a large group of people.  Group discussed understanding diagnoses, stigmas that accompany specific diagnoses, and becoming comfortable with diagnoses.  Therapist encouraged patient to practice self-care techniques, and provided group with a list of possible activities.       Nancie Neas, LPC

## 2016-02-01 NOTE — Progress Notes (Signed)
    Daily Group Progress Note  Program: IOP  Group Time: 9:00-10:30  Participation Level: Active  Behavioral Response: Appropriate  Type of Therapy:  Group Therapy  Summary of Progress: Pt. Presented as talkative, engaged in the group process. Pt. Discussed his experience of grieving his wife, his desire to reconnect with the his family and plans to go to his granddaughter's birthday party.     Group Time: 10:30-12:00  Participation Level:  Active  Behavioral Response: Appropriate  Type of Therapy: Psycho-education Group  Summary of Progress: Pt. Participated in grief and loss group with the Chaplain.  Nancie Neas, LPC

## 2016-02-02 ENCOUNTER — Ambulatory Visit: Payer: Self-pay | Admitting: Family

## 2016-02-02 ENCOUNTER — Other Ambulatory Visit (HOSPITAL_COMMUNITY): Payer: 59 | Admitting: Licensed Clinical Social Worker

## 2016-02-02 DIAGNOSIS — F319 Bipolar disorder, unspecified: Secondary | ICD-10-CM

## 2016-02-02 NOTE — Progress Notes (Signed)
    Daily Group Progress Note  Program: IOP  Group Time: 9:00-10:45  Participation Level: Active  Behavioral Response: Appropriate  Type of Therapy:  Group Therapy  Summary of Progress: Patient discussed a theme of grief and guilt displayed in the group. He mentioned that he would often think about his wife, and the many things that they enjoyed together. He struggled with feelings of guilt for laughing, or sitting on the swing that they enjoyed together. Therapist mentioned that it was okay to enjoy things, and to have "good days" without his wife.       Nancie Neas, LPC

## 2016-02-02 NOTE — Progress Notes (Signed)
Daily Group Progress Note  Program: IOP  Group Time: 10:45-12:00pm  Participation Level: Active  Behavioral Response: Appropriate  Type of Therapy:  Psychotherapy/ Group Therapy  Summary of Progress: Pt presented engaged in group process. Pt participated in a discussion about "The power of No." Pt was able to identify all daily activities required and how exhausting that can be. It was suggested to pt that feeling overwhelmed continuously can lead to burn out. The opportunity to replenish mind, body and spirit can lead to a less stressful life.

## 2016-02-03 ENCOUNTER — Other Ambulatory Visit (HOSPITAL_COMMUNITY): Payer: 59 | Admitting: Licensed Clinical Social Worker

## 2016-02-03 ENCOUNTER — Other Ambulatory Visit: Payer: Self-pay

## 2016-02-03 ENCOUNTER — Telehealth (HOSPITAL_COMMUNITY): Payer: Self-pay

## 2016-02-03 ENCOUNTER — Ambulatory Visit: Payer: Medicare Other | Admitting: Family

## 2016-02-03 DIAGNOSIS — F319 Bipolar disorder, unspecified: Secondary | ICD-10-CM | POA: Diagnosis not present

## 2016-02-03 DIAGNOSIS — R21 Rash and other nonspecific skin eruption: Secondary | ICD-10-CM

## 2016-02-03 MED ORDER — QUETIAPINE FUMARATE 25 MG PO TABS: 25.0000 mg | ORAL_TABLET | Freq: Every day | ORAL | Status: DC

## 2016-02-03 NOTE — Progress Notes (Signed)
    Daily Group Progress Note  Program: IOP  Group Time: 9:15-10:45 Participation Level: active Behavioral Response: engaged, responsive Type: Group Therapy Summary: Patient mentioned that he was a little anxious about leaving group. He mentioned a song that he heard, and acknowledged a quote on the wall that had been an encouragement to him. Patient stated that he didn't have to know the future, but just be ready to take the next step. Therapist validated what he had learned, and that he had grown in the group process.   Nancie Neas, LPC

## 2016-02-03 NOTE — Progress Notes (Signed)
Daily Group Progress Note  Program: IOP  Group Time: 10:45-12:00pm  Participation Level: Active  Behavioral Response: Appropriate  Type of Therapy:  Psychotherapy  Summary of Progress:  Pt participated in a discussion on positive affirmations. Pt participated in an activity "I am okay" by reading one positive affirmation with explanation of thoughts/feelings surrounding the affirmation read. Pt was challenged to read the affirmation every day to overcome self-sabotaging negative thoughts.

## 2016-02-03 NOTE — Telephone Encounter (Signed)
He should get 30 day supply from IOP provider

## 2016-02-03 NOTE — Telephone Encounter (Signed)
Patient calling for a refill on Klonopin, he last got it on 5/9 for 30 days, he has a follow up on 6/21. Patient is currently attending group daily - please review and advise, thank you

## 2016-02-03 NOTE — Progress Notes (Signed)
Patient ID: Justin [name], male   DOB: 03-14-1946, 70 y.o.   MRN: [id number] Refilled quetiapine 25 mg hs # 30 no additional refills

## 2016-02-04 ENCOUNTER — Other Ambulatory Visit (HOSPITAL_COMMUNITY): Payer: 59 | Admitting: Psychiatry

## 2016-02-04 DIAGNOSIS — F319 Bipolar disorder, unspecified: Secondary | ICD-10-CM | POA: Diagnosis not present

## 2016-02-04 DIAGNOSIS — F411 Generalized anxiety disorder: Secondary | ICD-10-CM

## 2016-02-04 MED ORDER — CLONAZEPAM 0.5 MG PO TABS: 0.5000 mg | ORAL_TABLET | Freq: Two times a day (BID) | ORAL | Status: DC

## 2016-02-04 NOTE — Telephone Encounter (Signed)
Dr. Lovena Le called this in today

## 2016-02-04 NOTE — Progress Notes (Signed)
UILLIAM KAZMER is a 70 y.o. , widowed, Caucasian male, who was referred to [facility] by therapist Audelia Acton, LCSW); treatment for worsening depressive symptoms. Stressor/Triggers: 1) Unresolved grief/loss issues: Wife died a couple of months ago due to pneumonia and COPD issues. Apparently, when wife was in the hospital, pt had a dissociative fugue state. Also, conflictual relationships with family (especially son). Admits to prior psychiatric hospitalizations. One prior suicide attempt (OD). Has been seeing Dr. Adele Schilder and Audelia Acton, LCSW since January 2017. Family Hx: Brother committed suicide (gunshot) at age 41.   Pt completed MH-IOP today.  Affect is somewhat brighter.  States he feels better.  "I was reluctant at first to attend the groups, but found it to be very helpful to hear that I had things in common with other people.  Especially there was another man who had lost his wife."  Denies SI/HI or A/V hallucinations.  A:  D/C today.  F/U with Dr. Adele Schilder on 02-10-16 @ 8:30 a.m and Audelia Acton, LCSW on 02-25-16 @ 3:30 pm.  Encouraged support groups.  Provided pt with [facility] support group information and strongly encouraged him to attend.  R:  Pt receptive.     Carlis Abbott, RITA, M.Ed, CNA

## 2016-02-04 NOTE — Progress Notes (Signed)
    Daily Group Progress Note  Program: IOP  Group Time: 9:00-12:00   Participation Level:  active   Behavioral Response: engaged   Type of Therapy:   group therapy   Summary of Progress: Client was anxious about being discharged, but felt hopeful that he could continue to use the skills he has learned within group to progress.  Client was told how much he has helped others in group, and acknowledged the changes he has made over the past few weeks.  Client will continue to work on being true to himself and not worrying about controlling what others think about him.  Nancie Neas, LPC

## 2016-02-04 NOTE — Patient Instructions (Signed)
Patient completed MH-IOP today.  Will follow up with Dr. Adele Schilder on 02-10-16 @ 8:30 a.m and Audelia Acton, LCSW 02/25/16 @ 3:30 pm.  Encouraged support groups.

## 2016-02-04 NOTE — Progress Notes (Signed)
Patient ID: [name], male   DOB: 1945-12-28, 70 y.o.   MRN: [id number] Discharge Note  Patient:  Justin [name] is an 70 y.o., male DOB:  06/15/1946  Date of Admission:  01/13/2016  Date of Discharge:  02/04/2016  Reason for Admission:depression  IOP Course:  Attended and participated.  Not a group person he says but found group useful.  Feels much better, more comfortable talking about the loss of his wife and looking forward to his next therapy session as he feels more comfortable talking. Still grieving, still depressed and anxious but decreased affect in all areas.  Mental Status at [place] depressed, less anxious.  No suicidal ideation, knows he will have to make changes after discharge and believes he is up to it  Lab Results: No results found for this or any previous visit (from the past 48 hour(s)).   Current outpatient prescriptions:  .  acetaminophen-codeine (TYLENOL #3) 300-30 MG tablet, Take 1 tablet by mouth every 6 (six) hours as needed for moderate pain., Disp: 30 tablet, Rfl: 0 .  CARTIA XT 180 MG 24 hr capsule, , Disp: , Rfl:  .  clonazePAM (KLONOPIN) 0.5 MG tablet, Take 1 tablet (0.5 mg total) by mouth 2 (two) times daily., Disp: 30 tablet, Rfl: 0 .  clopidogrel (PLAVIX) 75 MG tablet, Take 75 mg by mouth daily., Disp: , Rfl:  .  clotrimazole-betamethasone (LOTRISONE) cream, APPLY ONE CREAM TOPICALLY TWICE DAILY, Disp: 15 g, Rfl: 0 .  lamoTRIgine (LAMICTAL) 200 MG tablet, Take 1 tablet (200 mg total) by mouth daily., Disp: 30 tablet, Rfl: 0 .  nitroGLYCERIN (NITROSTAT) 0.3 MG SL tablet, Place 0.3 mg under the tongue every 5 (five) minutes as needed for chest pain., Disp: , Rfl:  .  QUEtiapine (SEROQUEL) 25 MG tablet, Take 1 tablet (25 mg total) by mouth at bedtime., Disp: 30 tablet, Rfl: 0 .  rosuvastatin (CRESTOR) 20 MG tablet, Take 20 mg by mouth daily., Disp: , Rfl:  .  triamcinolone cream (KENALOG) 0.1 %, Apply on affected area twice a day as needed, Disp:  30 g, Rfl: 0  Axis Diagnosis:  Bipolar 1 moderate, most recent episode depressed   Level of Care:  IOP  Discharge destination:  Other:  has appointment with his therapist and psychiatrist  Is patient on multiple antipsychotic therapies at discharge:  No    Has Patient had three or more failed trials of antipsychotic monotherapy by history:  Negative  Patient phone:  [phone] (home)  Patient address:   [place] [address],   Follow-up recommendations:  Activity:  continue current activity Diet:  continue current diet  Comments:  Doing much better  The patient received suicide prevention pamphlet:  Yes   Donnelly Angelica 02/04/2016, 9:28 AM

## 2016-02-05 ENCOUNTER — Other Ambulatory Visit (HOSPITAL_COMMUNITY): Payer: 59 | Admitting: Psychiatry

## 2016-02-08 ENCOUNTER — Other Ambulatory Visit (HOSPITAL_COMMUNITY): Payer: 59

## 2016-02-09 ENCOUNTER — Other Ambulatory Visit (HOSPITAL_COMMUNITY): Payer: 59

## 2016-02-10 ENCOUNTER — Ambulatory Visit (INDEPENDENT_AMBULATORY_CARE_PROVIDER_SITE_OTHER): Payer: 59 | Admitting: Psychiatry

## 2016-02-10 ENCOUNTER — Encounter (HOSPITAL_COMMUNITY): Payer: Self-pay | Admitting: Psychiatry

## 2016-02-10 ENCOUNTER — Other Ambulatory Visit (HOSPITAL_COMMUNITY): Payer: 59

## 2016-02-10 VITALS — BP 130/86 | HR 76 | Ht 72.0 in | Wt 176.4 lb

## 2016-02-10 DIAGNOSIS — F319 Bipolar disorder, unspecified: Secondary | ICD-10-CM

## 2016-02-10 MED ORDER — QUETIAPINE FUMARATE 50 MG PO TABS: 50.0000 mg | ORAL_TABLET | Freq: Every day | ORAL | Status: DC

## 2016-02-10 MED ORDER — ALPRAZOLAM 0.5 MG PO TABS: 0.5000 mg | ORAL_TABLET | Freq: Two times a day (BID) | ORAL | Status: DC

## 2016-02-10 MED ORDER — LAMOTRIGINE 200 MG PO TABS: 200.0000 mg | ORAL_TABLET | Freq: Every day | ORAL | Status: DC
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
## 2016-02-11 ENCOUNTER — Other Ambulatory Visit (HOSPITAL_COMMUNITY): Payer: 59

## 2016-02-12 ENCOUNTER — Other Ambulatory Visit (HOSPITAL_COMMUNITY): Payer: 59

## 2016-02-15 ENCOUNTER — Other Ambulatory Visit (HOSPITAL_COMMUNITY): Payer: 59

## 2016-02-16 ENCOUNTER — Other Ambulatory Visit (HOSPITAL_COMMUNITY): Payer: 59

## 2016-02-17 ENCOUNTER — Other Ambulatory Visit (HOSPITAL_COMMUNITY): Payer: 59

## 2016-02-18 ENCOUNTER — Other Ambulatory Visit (HOSPITAL_COMMUNITY): Payer: 59

## 2016-02-19 ENCOUNTER — Other Ambulatory Visit (HOSPITAL_COMMUNITY): Payer: 59

## 2016-02-22 ENCOUNTER — Other Ambulatory Visit (HOSPITAL_COMMUNITY): Payer: 59

## 2016-02-24 ENCOUNTER — Other Ambulatory Visit (HOSPITAL_COMMUNITY): Payer: 59

## 2016-02-25 ENCOUNTER — Encounter (HOSPITAL_COMMUNITY): Payer: Self-pay | Admitting: Clinical

## 2016-02-25 ENCOUNTER — Other Ambulatory Visit (HOSPITAL_COMMUNITY): Payer: 59

## 2016-02-25 ENCOUNTER — Ambulatory Visit (INDEPENDENT_AMBULATORY_CARE_PROVIDER_SITE_OTHER): Payer: 59 | Admitting: Clinical

## 2016-02-25 DIAGNOSIS — F4310 Post-traumatic stress disorder, unspecified: Secondary | ICD-10-CM

## 2016-02-25 DIAGNOSIS — F4321 Adjustment disorder with depressed mood: Secondary | ICD-10-CM | POA: Diagnosis not present

## 2016-02-25 DIAGNOSIS — F440 Dissociative amnesia: Secondary | ICD-10-CM | POA: Diagnosis not present

## 2016-02-25 DIAGNOSIS — F3162 Bipolar disorder, current episode mixed, moderate: Secondary | ICD-10-CM

## 2016-02-26 ENCOUNTER — Other Ambulatory Visit (HOSPITAL_COMMUNITY): Payer: 59

## 2016-02-29 ENCOUNTER — Other Ambulatory Visit (HOSPITAL_COMMUNITY): Payer: 59
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
## 2016-03-01 ENCOUNTER — Other Ambulatory Visit (HOSPITAL_COMMUNITY): Payer: 59

## 2016-03-02 ENCOUNTER — Other Ambulatory Visit (HOSPITAL_COMMUNITY): Payer: 59

## 2016-03-03 ENCOUNTER — Other Ambulatory Visit (HOSPITAL_COMMUNITY): Payer: 59

## 2016-03-04 ENCOUNTER — Other Ambulatory Visit (HOSPITAL_COMMUNITY): Payer: 59

## 2016-03-07 ENCOUNTER — Telehealth: Payer: Self-pay | Admitting: Emergency Medicine

## 2016-03-07 ENCOUNTER — Other Ambulatory Visit (HOSPITAL_COMMUNITY): Payer: 59

## 2016-03-07 NOTE — Telephone Encounter (Signed)
Pt called and is getting charged $35 copays instead of $10 copays. Wanted to switch to a Dr instead of NP. I advised the pt we would look into it. Please follow up thanks.

## 2016-03-08 ENCOUNTER — Other Ambulatory Visit (HOSPITAL_COMMUNITY): Payer: 59

## 2016-03-08 ENCOUNTER — Telehealth: Payer: Self-pay

## 2016-03-08 DIAGNOSIS — Z1211 Encounter for screening for malignant neoplasm of colon: Secondary | ICD-10-CM
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
## 2016-03-08 NOTE — Telephone Encounter (Signed)
Emailed billing to research, will contact patient with their response.

## 2016-03-08 NOTE — Telephone Encounter (Signed)
Order for colonoscopy placed.

## 2016-03-08 NOTE — Telephone Encounter (Signed)
Pt thinks he is due for a colonoscopy, if so can you refer him or do you need to see him?

## 2016-03-09 NOTE — Telephone Encounter (Signed)
Notified pt referral has been place for colonoscopy...Justin [name]

## 2016-03-15 ENCOUNTER — Encounter: Payer: Self-pay | Admitting: Internal Medicine

## 2016-03-16 ENCOUNTER — Ambulatory Visit (INDEPENDENT_AMBULATORY_CARE_PROVIDER_SITE_OTHER): Payer: 59 | Admitting: Clinical

## 2016-03-16 ENCOUNTER — Encounter (HOSPITAL_COMMUNITY): Payer: Self-pay | Admitting: Clinical

## 2016-03-16 DIAGNOSIS — F3162 Bipolar disorder, current episode mixed, moderate: Secondary | ICD-10-CM | POA: Diagnosis not present

## 2016-03-16 DIAGNOSIS — F440 Dissociative amnesia: Secondary | ICD-10-CM

## 2016-03-16 DIAGNOSIS — F4310 Post-traumatic stress disorder, unspecified: Secondary | ICD-10-CM | POA: Diagnosis not present

## 2016-03-16 DIAGNOSIS — F4321 Adjustment disorder with depressed mood: Secondary | ICD-10-CM

## 2016-03-16 NOTE — Progress Notes (Signed)
   THERAPIST PROGRESS NOTE  Session Time: 9:03 - 10:00  Participation Level: Active  Behavioral Response: CasualAlertDepressed  Type of Therapy: Individual Therapy  Treatment Goals addressed: improve psychiatric symptoms, Controlled Behavior, Moderated Mood, , Improve Unhelpful Thought Patterns, Emotional Regulation Skills (Moderate moods, stress management),   Healthy Coping Skills,    Interventions: CBT, Motivational Interviewing,  and psychoeducation  Summary: Justin [name] is a 70 y.o. male who presents with Bipolar 1 Disorder, Mixed, Moderate and Dissociative Amnesia, Grief, and PTSD  Suicidal/Homicidal: No - without intent/plan  Therapist Response:  Filimon met with clinician for an individual session. [name] discussed his psychiatric symptoms, his current life events and his homework. [name] shared that he experienced some mania on Saturday after last session. He shared that he worked to use the skills learned to manage his symptoms. He stated that Sunday he woke with a lot of grief and the thought to drink alcohol (which he did not) and was feeling suicidal and lonely. Clinician asked open ended questions and [name] shared the skills he used to get through it. He shared he felt clinician would be disappointed in him if he did so he decided to wait it out. He shared that the next morning he woke up feeling okay. Client and clinician discussed the fleeting nature of emotions. Clinician asked open ended questions and [name] shared his thoughts and insights. He shared that his neighbor had asked him to do something to help him which he did and it improved his mood. Client and clinician discussed the need for him to have social interaction. Clinician suggested joining a group and provided a [facility]ender [facility] things [facility] the [facility], as well as the possibility of volunteering somewhere. He shared he is going to look into both plus attend grief group. He stated he is having surgery  on his elbow next wed and has to pay his son to take him. He also shared he visited with his sister for the first time in a long time. He shared it was hard because she wanted to know about his "break down" which he has to go to court for on Sept.8th. Client and clinician discussed stress reduction techniques. Clinician told him she was very pleased he waited his emotions out. Client and clinician reviewed what he could do if he were feeling suicidal again. Clinician highlighted the suicide prevention hotline because he said he just needed someone to talk to and didn't want to be locked up. He said he would however go to the hospital if things were worse than needing someone to talk to  Plan: Return again in 1 -2 weeks.  Diagnosis:     Axis I: Bipolar 1 Disorder, Mixed, Moderate and Dissociative Amnesia, Grief, and PTSD    Nadezhda Pollitt A, LCSW 03/16/2016

## 2016-03-22 NOTE — Telephone Encounter (Signed)
Advised pt to call [facility] and change his PCP to our practice in order to receive in network PCP benefits at [facility], pt understood.

## 2016-03-22 NOTE — Telephone Encounter (Signed)
Left message for patient to call me.  (Per [facility], since patient still has Justin [name] as his PCP, they are processing his visits here as out of network. Pt needs to change his PCP to get in network benefits at our location.)

## 2016-03-23 ENCOUNTER — Ambulatory Visit (HOSPITAL_COMMUNITY): Payer: Self-pay | Admitting: Psychiatry

## 2016-03-29 ENCOUNTER — Other Ambulatory Visit (HOSPITAL_COMMUNITY): Payer: Self-pay | Admitting: Psychiatry

## 2016-03-29 DIAGNOSIS — F319 Bipolar disorder, unspecified: Secondary | ICD-10-CM

## 2016-03-30 ENCOUNTER — Other Ambulatory Visit (HOSPITAL_COMMUNITY): Payer: Self-pay

## 2016-03-30 DIAGNOSIS — F319 Bipolar disorder, unspecified: Secondary | ICD-10-CM

## 2016-03-30 MED ORDER — ALPRAZOLAM 0.5 MG PO TABS: 0.5000 mg | ORAL_TABLET | Freq: Two times a day (BID) | ORAL | 1 refills | Status: DC

## 2016-04-03 ENCOUNTER — Other Ambulatory Visit (HOSPITAL_COMMUNITY): Payer: Self-pay | Admitting: Psychiatry

## 2016-04-03 DIAGNOSIS — F319 Bipolar disorder, unspecified: Secondary | ICD-10-CM

## 2016-04-04 ENCOUNTER — Other Ambulatory Visit (HOSPITAL_COMMUNITY): Payer: Self-pay

## 2016-04-04 DIAGNOSIS — F319 Bipolar disorder, unspecified: Secondary | ICD-10-CM

## 2016-04-04 MED ORDER — LAMOTRIGINE 200 MG PO TABS: 200.0000 mg | ORAL_TABLET | Freq: Every day | ORAL | 1 refills | Status: DC

## 2016-04-04 MED ORDER — QUETIAPINE FUMARATE 50 MG PO TABS: 50.0000 mg | ORAL_TABLET | Freq: Every day | ORAL | 1 refills | Status: DC

## 2016-04-06 ENCOUNTER — Ambulatory Visit (INDEPENDENT_AMBULATORY_CARE_PROVIDER_SITE_OTHER): Payer: 59 | Admitting: Clinical

## 2016-04-06 DIAGNOSIS — F4321 Adjustment disorder with depressed mood: Secondary | ICD-10-CM | POA: Diagnosis not present

## 2016-04-06 DIAGNOSIS — F3162 Bipolar disorder, current episode mixed, moderate: Secondary | ICD-10-CM

## 2016-04-06 DIAGNOSIS — F440 Dissociative amnesia: Secondary | ICD-10-CM

## 2016-04-06 DIAGNOSIS — F4310 Post-traumatic stress disorder, unspecified: Secondary | ICD-10-CM | POA: Diagnosis not present

## 2016-04-06 NOTE — Progress Notes (Signed)
   THERAPIST PROGRESS NOTE  Session Time: 2:35 - 4:30   Participation Level: Active  Behavioral Response: CasualAlertDepressed  Type of Therapy: Individual Therapy  Treatment Goals addressed: improve psychiatric symptoms, Controlled Behavior, Moderated Mood, Deliberative Speech (improved social functioning), Improve Unhelpful Thought Patterns, Emotional Regulation Skills (Moderate moods, stress management), Feel and express [name] full Range of Emotions, Learn about Diagnosis, Healthy Coping Skills,    Interventions: CBT, Motivational Interviewing, grounding techniques and mindfulness techniques, and psychoeducation  Summary: Justin [name] is [name] 70 y.o. male who presents with Bipolar 1 Disorder, Mixed, Moderate and Dissociative Amnesia, Grief, and PTSD  Suicidal/Homicidal: No - without intent/plan  Therapist Response:  Justin [name] met with clinician for an individual session. Justin [name] discussed his psychiatric symptoms and his current life events. Justin [name] shared that he was doing better this week than last. He stated that he had some grief, some depression, and some good moments. He shared that he had some good interactions with his children which made him feel better. Justin [name] and clinician discussed the difficult days. Justin [name] shared that he is missing his wife [name] lot. Justin [name] and clinician discussed the good moments. He shared that he visited with his children and they were kinder and more open than recent times. Justin [name] shared they asked about his dissociative amnesia episode. He told them that he still was unable to remember. Other times they became upset but this time they accepted his answer.  He shared that the high light was seeing his grandchildren. He shared that he would like to be emotionally available but is also afraid of being hurt. Justin [name] and clinician discussed the cost vs benefit of being open and of being emotionally distant. Justin [name] and clinician discussed how to be open while also  maintaining healthy boundaries.  Justin [name] and clinician discussed stress management and healthy ways to interact with others. [name] confirmed that when he is less reactive his responses are more in alignment with his desired outcome.    Plan: Return again in 1 -2 weeks.  Diagnosis:     Axis I: Bipolar 1 Disorder, Mixed, Moderate and Dissociative Amnesia, Grief, and PTSD    Justin Chicoine A, LCSW 04/06/2016

## 2016-04-12 ENCOUNTER — Encounter (HOSPITAL_COMMUNITY): Payer: Self-pay | Admitting: Clinical

## 2016-04-20 ENCOUNTER — Ambulatory Visit (HOSPITAL_COMMUNITY): Payer: Self-pay | Admitting: Clinical

## 2016-04-20 ENCOUNTER — Ambulatory Visit (INDEPENDENT_AMBULATORY_CARE_PROVIDER_SITE_OTHER): Payer: 59 | Admitting: Clinical

## 2016-04-20 DIAGNOSIS — F4321 Adjustment disorder with depressed mood: Secondary | ICD-10-CM

## 2016-04-20 DIAGNOSIS — F3162 Bipolar disorder, current episode mixed, moderate: Secondary | ICD-10-CM

## 2016-04-20 DIAGNOSIS — F4310 Post-traumatic stress disorder, unspecified: Secondary | ICD-10-CM

## 2016-04-20 DIAGNOSIS — F440 Dissociative amnesia: Secondary | ICD-10-CM

## 2016-04-20 NOTE — Progress Notes (Signed)
THERAPIST PROGRESS NOTE  Session Time: 11:00 -11:56  Participation Level: Active  Behavioral Response: CasualAlertDepressed  Type of Therapy: Individual Therapy  Treatment Goals addressed: improve psychiatric symptoms,  Moderated Mood (improved social functioning), Improve Unhelpful Thought Patterns, Emotional Regulation Skills (Moderate moods), Feel and express a full Range of Emotions,  Healthy Coping Skills, Recall the Traumatic event without being overwhelmed    Interventions: CBT, Motivational Interviewing, grounding techniques and mindfulness techniques,  Summary: Justin [name] is a 70 y.o. male who presents with Bipolar 1 Disorder, Mixed, Moderate and Dissociative Amnesia, Grief, and PTSD  Suicidal/Homicidal: No - without intent/plan  Therapist Response:  Chandan met with clinician for an individual session. Justin [name] discussed his psychiatric symptoms and his current life events.  Justin [name] shared that he is been experiencing a little bit of mania and not sleeping. Client and clinician discussed things he could do to improve his sleep including exercise in the day setting a regular bedtime, eating healthy, and taking his medications He shared that month things have been a little better with his son was feeling hurt because he wasn't invited to his granddaughter's birthday party. Clinician asked open ended questions and Justin [name] explored his thoughts and feelings. Justin [name] shared that he had been really missing his wife and having someone shared with. Justin [name] shared that old painful memories have been coming up for him. Clinician asked open ended questions and Justin [name] discussed some of the memories. Client and clinician discussed his feeling in the meaning he is given to the events. Justin [name] was sometimes tearful he shared. Client and clinician discussed the evidence for and against the unhelpful meaning he gave the events. Client and clinician discussed alternative ways to view the events.  Justin [name] shared that it felt good to have talked about these painful things. Client and clinician discussed the fact that he was able to do so and express his emotions. Client and clinician practiced a grounding technique to together.. Clinician encouraged Justin [name] to practice his grounding and mindfulness techniques daily as well as speak to his psychiatrist a bout his mania if he didn't improve.  Plan: Return again in 1 -2 weeks.  Diagnosis:     Axis I: Bipolar 1 Disorder, Mixed, Moderate and Dissociative Amnesia, Grief, and PTSD    Majour Frei A, LCSW 04/20/2016

## 2016-04-27 ENCOUNTER — Encounter (HOSPITAL_COMMUNITY): Payer: Self-pay | Admitting: Clinical

## 2016-05-05 ENCOUNTER — Ambulatory Visit (INDEPENDENT_AMBULATORY_CARE_PROVIDER_SITE_OTHER): Payer: 59 | Admitting: Psychiatry

## 2016-05-05 ENCOUNTER — Encounter (HOSPITAL_COMMUNITY): Payer: Self-pay | Admitting: Psychiatry

## 2016-05-05 ENCOUNTER — Ambulatory Visit (INDEPENDENT_AMBULATORY_CARE_PROVIDER_SITE_OTHER): Payer: Medicare Other

## 2016-05-05 DIAGNOSIS — F319 Bipolar disorder, unspecified: Secondary | ICD-10-CM | POA: Diagnosis not present

## 2016-05-05 DIAGNOSIS — Z23 Encounter for immunization: Secondary | ICD-10-CM

## 2016-05-05 MED ORDER — QUETIAPINE FUMARATE 100 MG PO TABS: 100.0000 mg | ORAL_TABLET | Freq: Every day | ORAL | 2 refills | Status: DC

## 2016-05-05 MED ORDER — ALPRAZOLAM 0.5 MG PO TABS: 0.5000 mg | ORAL_TABLET | Freq: Two times a day (BID) | ORAL | 1 refills | Status: DC

## 2016-05-05 MED ORDER — LAMOTRIGINE 200 MG PO TABS: 200.0000 mg | ORAL_TABLET | Freq: Every day | ORAL | 1 refills | Status: DC

## 2016-05-05 NOTE — Progress Notes (Signed)
[facility] [phone] Progress Note  Justin [name] [id number] 69 y.o.  05/05/2016 11:29 AM  Chief Complaint:  I'm taking medication but I continued to have insomnia.  I still feel depressed some nights.  I continued to have nightmares.  I missed my wife .  History of Present Illness:  Justin [name] came for his follow-up appointment.  On his last visit he requested to switch back on Xanax because Klonopin was not helping him.  He is taking Xanax only as needed.  He continues to have insomnia.  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  He was given narcotic pain medication which he finished.  He is happy that his son does not want to pursue charges and is hoping that charges will dropped on his next court date on October 17.  He is taking Lamictal, Seroquel and Xanax.  Some nights he has taken Seroquel 75 mg to get better sleep but he does not see any huge improvement.  He also lost more than 10 pounds in past few months.  He is trying to lose weight because of his cardiac issue.  He is seeing Dr. Jack Quarto in [place] .  He is also seeing Tharon Aquas for counseling.  Overall he noticed improvement in his depression, crying spells but he continues to have rumination and some time sadness.  He is wondering if he can increase Seroquel 200 mg.  He has no tremors, shakes, EPS or any side effects.  He is not drinking or using any illegal substances.  His appetite is fair.  Denies any suicidal parts or homicidal thought.  He admitted missing his deceased wife and sometime he has nightmares and rumination.  Suicidal Ideation: No Plan Formed: No Patient has means to carry out plan: No  Homicidal Ideation: No Plan Formed: No Patient has means to carry out plan: No  Past Psychiatric History/Hospitalization(s): Patient has bipolar disorder with at least more than 15 psychiatric hospitalization.  Most of psychiatric hospitalization was due to mania, depression, suicidal attempt.  He has  taken overdose on his psychiatric medication including Ambien, Vistaril and alcohol.  His last psychiatric hospitalization was in 2007 at [facility].  In the past he has taken Zyprexa, Cymbalta, Seroquel, Vistaril, Ambien, Neurontin , Wellbutrin and Trileptal however he do not remember the details very well.  He did remember lithium cause side effects.  He was seeing Dr. Letta Moynahan and Parthenia Ames.  Patient has history of mania, psychosis, hallucination and severe.  He recently finished intensive outpatient program depression.   Anxiety: No Bipolar Disorder: Yes Depression: Yes Mania: Yes Psychosis: Yes Schizophrenia: No Personality Disorder: No Hospitalization for psychiatric illness: Yes History of Electroconvulsive Shock Therapy: No Prior Suicide Attempts: Yes  Medical History; Patient has asthma, chronic joint pain, hyperlipidemia, history of chickenpox, headaches,.  He has history of nasal septum surgery, hernia repair, back surgery, elbow surgery and knee surgery.   Family History; Patient reported brother committed suicide and sister had attempted suicide and later died due to complication.      Substance Abuse History; Patient endorse history of drinking in the past but claims to be sober from heavy drinking in recent 5 years.  He admitted social drinking beer but denies any intoxication or any binge.  He denies any illegal substance use.    Review of Systems  Constitutional: Negative.   Cardiovascular: Negative for chest pain and palpitations.  Musculoskeletal: Negative.   Skin: Negative for itching and rash.  Neurological: Negative for dizziness, tremors and headaches.  Psychiatric/Behavioral: Positive for depression. Negative for hallucinations, substance abuse and suicidal ideas. The patient has insomnia.        Irritability    Psychiatric: Agitation: No Hallucination: No Depressed Mood: Yes Insomnia: Yes Hypersomnia: No Altered Concentration:  No Feels Worthless: No Grandiose Ideas: No Belief In Special Powers: No New/Increased Substance Abuse: No Compulsions: No  Neurologic: Headache: No Seizure: No Paresthesias: No   Outpatient Encounter Prescriptions as of 05/05/2016  Medication Sig  . ALPRAZolam (XANAX) 0.5 MG tablet Take 1 tablet (0.5 mg total) by mouth 2 (two) times daily.  Marland Kitchen CARTIA XT 180 MG 24 hr capsule   . clopidogrel (PLAVIX) 75 MG tablet Take 75 mg by mouth daily.  . clotrimazole-betamethasone (LOTRISONE) cream APPLY ONE CREAM TOPICALLY TWICE DAILY  . lamoTRIgine (LAMICTAL) 200 MG tablet Take 1 tablet (200 mg total) by mouth daily.  . nitroGLYCERIN (NITROSTAT) 0.4 MG SL tablet   . QUEtiapine (SEROQUEL) 100 MG tablet Take 1 tablet (100 mg total) by mouth at bedtime.  . rosuvastatin (CRESTOR) 20 MG tablet Take 20 mg by mouth daily.  Marland Kitchen triamcinolone cream (KENALOG) 0.1 % Apply on affected area twice a day as needed  . [DISCONTINUED] ALPRAZolam (XANAX) 0.5 MG tablet Take 1 tablet (0.5 mg total) by mouth 2 (two) times daily.  . [DISCONTINUED] lamoTRIgine (LAMICTAL) 200 MG tablet Take 1 tablet (200 mg total) by mouth daily.  . [DISCONTINUED] QUEtiapine (SEROQUEL) 50 MG tablet Take 1 tablet (50 mg total) by mouth at bedtime.   No facility-administered encounter medications on file as of 05/05/2016.     No results found for this or any previous visit (from the past 2160 hour(s)).    Constitutional:  BP 118/70   Pulse 67   Ht  (1.803 m)   Wt 166 lb 9.6 oz (75.6 kg)   BMI 23.24 kg/m    Musculoskeletal: Strength & Muscle Tone: within normal limits Gait & Station: normal Patient leans: N/A  Psychiatric Specialty Exam: General Appearance: Casual  Eye Contact::  Fair  Speech:  Slow  Volume:  Decreased  Mood:  Depressed  Affect:  Constricted and Depressed  Thought Process:  Goal Directed  Orientation:  Full (Time, Place, and Person)  Thought Content:  Rumination  Suicidal Thoughts:  No   Homicidal Thoughts:  No  Memory:  Immediate;   Fair Recent;   Fair Remote;   Fair  Judgement:  Fair  Insight:  Fair  Psychomotor Activity:  Decreased  Concentration:  Fair  Recall:  [employer] of Knowledge:  Fair  Language:  Fair  Akathisia:  No  Handed:  Right  AIMS (if indicated):     Assets:  Communication Skills Desire for Improvement Financial Resources/Insurance Housing Physical Health  ADL's:  Intact  Cognition:  WNL  Sleep:        Established Problem, Stable/Improving (1), Review of Psycho-Social Stressors (1), Review and summation of old records (2), Established Problem, Worsening (2), Review of Last Therapy Session (1), Review of Medication Regimen & Side Effects (2) and Review of New Medication or Change in Dosage (2)  Assessment: Axis I: Bipolar disorder, mixed.  Rule out posttraumatic stress disorder.  Rule out major depressive disorder with psychosis  Axis II: deferred   Axis III:  Past Medical History:  Diagnosis Date  . Arthritis   . Asthma   . Chicken pox   . Depression   . Hyperlipidemia   . Migraines  Plan:  I reviewed records fromPrevious follow-up.  He is losing weight as per his recommendation from his cardiologist.  I recommended to increase Seroquel 100 mg at bedtime to help his insomnia and depression.  I recommended not to take any narcotic pain medication if he is taking Xanax.  Continue Xanax 0.5 mg twice a day as needed, Lamictal 200 mg daily, Seroquel 100 mg at bedtime.  He has no rash itching or any headaches.  Recommended to continue [facility] for counseling and coping skills. Discussed benzodiazepine dependence, tolerance and withdrawal symptoms.  Discuss safety plan that anytime having active suicidal thoughts or homicidal thought and he need to call 911 or go to the local emergency room.  Follow-up in 12 weeks.  Antonia Culbertson T., MD 05/05/2016        Patient ID: [name], male   DOB: 1946-02-11, 70 y.o.   MRN:  [id number]

## 2016-05-05 NOTE — Progress Notes (Addendum)
I agree with the plan and immunizations.

## 2016-05-10 ENCOUNTER — Ambulatory Visit (INDEPENDENT_AMBULATORY_CARE_PROVIDER_SITE_OTHER): Payer: 59 | Admitting: Clinical

## 2016-05-10 DIAGNOSIS — F4321 Adjustment disorder with depressed mood: Secondary | ICD-10-CM

## 2016-05-10 DIAGNOSIS — F440 Dissociative amnesia: Secondary | ICD-10-CM | POA: Diagnosis not present

## 2016-05-10 DIAGNOSIS — F4310 Post-traumatic stress disorder, unspecified: Secondary | ICD-10-CM | POA: Diagnosis not present

## 2016-05-10 DIAGNOSIS — F319 Bipolar disorder, unspecified: Secondary | ICD-10-CM

## 2016-05-10 NOTE — Progress Notes (Signed)
   THERAPIST PROGRESS NOTE  Session Time: 4:32 - 5:28  Participation Level: Active  Behavioral Response: CasualAlertAnxious and Depressed  Type of Therapy: Individual Therapy  Treatment Goals addressed: improve psychiatric symptoms, Controlled Behavior, Moderated Mood, Deliberative Speech (improved social functioning), Improve Unhelpful Thought Patterns, Emotional Regulation Skills (Moderate moods, anger management, stress management),   Healthy Coping Skills, Recall the Traumatic event without being overwhelmed    Interventions: CBT, Motivational Interviewing,  Summary: Rad Gramling is a 70 y.o. male who presents with Bipolar 1 Disorder, Mixed, Moderate and Dissociative Amnesia, Grief, and PTSD  Suicidal/Homicidal: No - without intent/plan  Therapist Response:  Vidur met with clinician for an individual session. Henning discussed his psychiatric symptoms, and his current life events. Vinh shared that he has been okay on some days he is been a little manic other days and little depressed other days. He shared that his mania has taken the form of buying things he doesn't need. He shared that he is planning to take the things back. Jahsiah shared that he is working to remain stable. He shared this been 7 months since his wife passed away and he has misses her a lot. Ashley shared that his court date has been continued until October 17 and that he is concerned. His daughter says that she is not planning to press charges and will notify the court but his son and daughter-in-law back and forth with that makes him feel a lot of stress. Nicanor shared some of the ways his son goes out of his way to be a jerk to him. Client and clinician discussed Sante's options. Ousman's goal is to have good relationship with his children and his grandchildren. Clinician asked open ended questions and Yasser identified healthy coping skills and behaviors that would make that more possible. Client and  clinician discussed anger management skills. Wilfrid also shared a bout the trauma from the past which had to do with his brother's death. Clinician asked open ended questions. Winford shared some of his negative beliefs about himself  that stem from his brother's death. Client and clinician discussed the evidence for and against his negative beliefs. Gorden was then able to formulate healthier alternative beliefs. Clester agreed to continue practicing his grounding and mindfulness techniques.   Plan: Return again in 1 -2 weeks.  Diagnosis:     Axis I: Bipolar 1 Disorder, Mixed, Moderate and Dissociative Amnesia, Grief, and PTSD    Waino Mounsey A, LCSW 05/10/2016

## 2016-05-18 ENCOUNTER — Encounter (HOSPITAL_COMMUNITY): Payer: Self-pay | Admitting: Clinical

## 2016-05-18 ENCOUNTER — Encounter: Payer: Self-pay | Admitting: Family

## 2016-05-24 ENCOUNTER — Telehealth: Payer: Self-pay | Admitting: Family

## 2016-05-24 NOTE — Telephone Encounter (Signed)
Scheduled pt a physical with [name] to get all blood work done as well.

## 2016-05-24 NOTE — Telephone Encounter (Signed)
[facility] is requesting an order to get hep c screening

## 2016-05-26 ENCOUNTER — Ambulatory Visit (HOSPITAL_COMMUNITY): Payer: Self-pay | Admitting: Clinical

## 2016-05-27 ENCOUNTER — Encounter: Payer: Self-pay | Admitting: Internal Medicine

## 2016-05-27 ENCOUNTER — Telehealth: Payer: Self-pay

## 2016-05-27 ENCOUNTER — Ambulatory Visit (INDEPENDENT_AMBULATORY_CARE_PROVIDER_SITE_OTHER): Payer: Medicare Other | Admitting: Internal Medicine

## 2016-05-27 VITALS — BP 118/70 | HR 64 | Ht 69.0 in | Wt 162.4 lb

## 2016-05-27 DIAGNOSIS — Z86010 Personal history of colonic polyps: Secondary | ICD-10-CM

## 2016-05-27 DIAGNOSIS — Z7902 Long term (current) use of antithrombotics/antiplatelets: Secondary | ICD-10-CM | POA: Diagnosis not present

## 2016-05-27 DIAGNOSIS — I2510 Atherosclerotic heart disease of native coronary artery without angina pectoris: Secondary | ICD-10-CM | POA: Diagnosis not present

## 2016-05-27 DIAGNOSIS — Z9861 Coronary angioplasty status: Secondary | ICD-10-CM

## 2016-05-27 NOTE — Telephone Encounter (Signed)
[facility]  [address] [place] [address]  [id number]  Dear Dr Shirlee More:  We have scheduled the above named patient for a(n) colonoscopy procedure. Our records show that (s)he is on anticoagulation therapy.  Please advise as to whether the patient may come of their therapy of Plavix  prior to their procedure which is scheduled for 06/15/16.  Please route your response to Karita Dralle [place], [place] or fax response to [phone].  Sincerely,    Dr Silvano Rusk    Faxed letter to him in [place] fax # [phone]

## 2016-05-27 NOTE — Patient Instructions (Signed)
  You have been scheduled for a colonoscopy. Please follow written instructions given to you at your visit today.  Please pick up your prep supplies at the pharmacy. If you use inhalers (even only as needed), please bring them with you on the day of your procedure. Your physician has requested that you go to www.startemmi.com and enter the access code given to you at your visit today. This web site gives a general overview about your procedure. However, you should still follow specific instructions given to you by our office regarding your preparation for the procedure.    You will be contaced by our office prior to your procedure for directions on holding your Plavix. If you do not hear from our office 1 week prior to your scheduled procedure, please call [phone] to discuss.   I appreciate the opportunity to care for you. Silvano Rusk, MD, [facility]

## 2016-05-27 NOTE — Progress Notes (Signed)
Justin [name] 70 y.o. 25-Jul-1946 [id number]  Assessment & Plan:   1. Hx of colonic polyps   2. CAD S/P percutaneous coronary stenting   3. Long term current use of antithrombotics/antiplatelets    He had a hyperplastic right colon polyp so that could've been a sessile serrated polyp. It is appropriate to repeat a colonoscopy for history of colon polyps. He takes Plavix because of a history of coronary artery disease and stenting, it's my recommendation he'll that 5-7 days before colonoscopy. We will clarify that with his cardiologist Dr. Bettina Gavia. He is off aspirin and thinks Dr. Bettina Gavia wanted to do that I cannot see that in the records I reviewed. He and we will ask about resuming aspirin at 81 mg which I suspect is appropriate.  The risks and benefits as well as alternatives of endoscopic procedure(s) have been discussed and reviewed. All questions answered. The patient agrees to proceed. I have also explained the extra risk of cardiovascular event off Plavix.   Subjective:   Chief Complaint:History of colon polyps  HPI The patient is not having any active GI symptoms though he lost his wife to pneumonia in the spring and has been grieving and has lost weight. A colonoscopy in 2004 I don't have the reports but I had a chance to review pathology reported sin the computer, and he had 3 hyperplastic polyps one of which was in the right colon. He says he had a stress test this past year any past with flying colors, he denies any chest pain shortness of breath problems. Main issue has been the grieving. There is occasional pedal edema, fatigue and trouble sleeping. He's been tired.  Allergies  Allergen Reactions  . Ambien [Zolpidem Tartrate] Other (See Comments)    Blackout, memory issues   Outpatient Medications Prior to Visit  Medication Sig Dispense Refill  . ALPRAZolam (XANAX) 0.5 MG tablet Take 1 tablet (0.5 mg total) by mouth 2 (two) times daily. 60 tablet 1  . CARTIA XT 180 MG  24 hr capsule     . clopidogrel (PLAVIX) 75 MG tablet Take 75 mg by mouth daily.    Marland Kitchen lamoTRIgine (LAMICTAL) 200 MG tablet Take 1 tablet (200 mg total) by mouth daily. 30 tablet 1  . nitroGLYCERIN (NITROSTAT) 0.4 MG SL tablet     . QUEtiapine (SEROQUEL) 100 MG tablet Take 1 tablet (100 mg total) by mouth at bedtime. 30 tablet 2  . rosuvastatin (CRESTOR) 20 MG tablet Take 20 mg by mouth daily.    . clotrimazole-betamethasone (LOTRISONE) cream APPLY ONE CREAM TOPICALLY TWICE DAILY 15 g 0  . triamcinolone cream (KENALOG) 0.1 % Apply on affected area twice a day as needed 30 g 0   No facility-administered medications prior to visit.    Past Medical History:  Diagnosis Date  . Anxiety   . Arthritis   . Asthma   . Bipolar disorder ([place])   . CAD S/P percutaneous coronary stenting   . CHF (congestive heart failure) ([place])   . Chicken pox   . Colon polyps   . Depression   . Dissociative amnesia   . Grief   . Hyperlipidemia   . Migraines   . NSTEMI (non-ST elevated myocardial infarction) ([place])   . PTSD (post-traumatic stress disorder)    Past Surgical History:  Procedure Laterality Date  . CERVICAL DISCECTOMY    . ELBOW SURGERY Left   . ELBOW SURGERY Right   . INGUINAL HERNIA REPAIR Bilateral  1996, 1997  . KNEE ARTHROSCOPY Right   . KNEE CARTILAGE SURGERY Left   . NASAL SEPTUM SURGERY    . STENT PLACEMENT VASCULAR ([place] [facility])  09/02/2010  . TONSILLECTOMY     Social History   Social History  . Marital status: Widowed    Spouse name: N/A  . Number of children: 2  . Years of education: 12   Occupational History  . Retired    Social History Main Topics  . Smoking status: Never Smoker  . Smokeless tobacco: Never Used  . Alcohol use No  . Drug use: No  . Sexual activity: Not Asked   Other Topics Concern  . None   Social History Narrative   Retired, widowed in 2017    1 son one daughter   2 caffeinated beverages daily no alcohol or tobacco   Fun: Work out in the  yard.   Denies religious beliefs effecting healthcare.    Family History  Problem Relation Age of Onset  . Arthritis Mother   . Heart disease Mother   . Hypertension Mother   . Alzheimer's disease Mother   . Heart attack Mother   . Arthritis Father   . Hyperlipidemia Father   . Heart disease Father   . Stroke Father   . Hypertension Father   . Heart attack Father   . Multiple sclerosis Sister   . Diabetes Sister   . Lung cancer Paternal Grandmother   . Diabetes Sister        Review of Systems As per history of present illness. He's had an itchy rash in the past few months, been thirsty and urinating more frequently back pain arthritis complaints. All other review of systems are negative.  Objective:   Physical Exam @BP  118/70 (BP Location: Left Arm, Patient Position: Sitting, Cuff Size: Normal)   Pulse 64   Ht 5\' 9"  (1.753 m) Comment: height measured without shoes  Wt 162 lb 6 oz (73.7 kg)   BMI 23.98 kg/m @  General:  Well-developed, well-nourished and in no acute distress Eyes:  anicteric. ENT:   . Poor dentition missing most of his teeth Lungs: Clear to auscultation bilaterally. Heart:  S1S2, no rubs, murmurs, gallops. Abdomen:  soft, non-tender, no hepatosplenomegaly, hernia, or mass and BS+.  Rectal: deferred Extremities:   no edema, cyanosis or clubbing Neuro:  A&O x 3.  Psych:  appropriate mood and  Affect.   Data Reviewed:  Lab Results  Component Value Date   WBC 5.3 03/26/2015   HGB 14.9 03/26/2015   HCT 44.2 03/26/2015   MCV 93.4 03/26/2015   PLT 316.0 03/26/2015   2017 cardiology notes

## 2016-06-01 ENCOUNTER — Ambulatory Visit (INDEPENDENT_AMBULATORY_CARE_PROVIDER_SITE_OTHER): Payer: 59 | Admitting: Clinical

## 2016-06-01 ENCOUNTER — Encounter: Payer: Self-pay | Admitting: Internal Medicine

## 2016-06-01 DIAGNOSIS — F319 Bipolar disorder, unspecified: Secondary | ICD-10-CM

## 2016-06-01 DIAGNOSIS — F440 Dissociative amnesia: Secondary | ICD-10-CM

## 2016-06-01 DIAGNOSIS — F4310 Post-traumatic stress disorder, unspecified: Secondary | ICD-10-CM | POA: Diagnosis not present

## 2016-06-01 DIAGNOSIS — F4321 Adjustment disorder with depressed mood: Secondary | ICD-10-CM

## 2016-06-01 DIAGNOSIS — F4320 Adjustment disorder, unspecified: Secondary | ICD-10-CM

## 2016-06-01 NOTE — Progress Notes (Signed)
   THERAPIST PROGRESS NOTE  Session Time: 2:30 - 3:28  Participation Level: Active  Behavioral Response: NeatAlertAnxious and Depressed  Type of Therapy: Individual Therapy  Treatment Goals addressed: improve psychiatric symptoms, Controlled Behavior, Moderated Mood, Deliberative Speech (improved social functioning), Improve Unhelpful Thought Patterns, Emotional Regulation Skills ( stress management), Feel and express a full Range of Emotions, Learn about Diagnosis, Healthy Coping Skills,   Interventions: CBT, Motivational Interviewing, grounding techniques and mindfulness techniques, and psychoeducation  Summary: Justin [name] is a 70 y.o. male who presents with Bipolar 1 Disorder, Mixed, Moderate and Dissociative Amnesia, Grief, and PTSD  Suicidal/Homicidal: No - without intent/plan  Therapist Response:  [name] met with clinician for an individual session. [name] discussed his psychiatric symptoms and  his current life events. My shared that he has been having a difficult time with his emotions lately. Clinician asked open ended questions. He shared that his birthday was on [birth year]. He shared that he was missing his wife very much. He shared that she has son and her wife behave strangely to him. He shared that he has court on Tuesday which is making him very anxious. He shared that his daughter has said that she will not press charges but that his son and daughter-in-law have not said what they will do. He shared that his daughter told him exactly what he said and did - that he does not remember. Client and clinician discussed stress and his behaviors while his wife was dying. Client and clinician discussed different responses to  Stress. Client and clinician discussed how his bipolar symptoms were likely aggravated by lack of sleep and stress. Client and clinician discussed the process of forgiving oneself. Client and clinician discussed stress management skills. Clinician asked  open ended questions a bout [place] worst fears a bout court client and clinician discussed the evidence for and against his worse fears. [name] was able to put his fears in perspective. He was able to recognize that even if his worse fears happened they would be manageable. [name] and clinician discussed his desire Healthy family relationships. Client and clinician practiced a grounding technique together. Client and clinician reviewed some grounding and mindfulness techniques    Plan: Return again in 1 -2 weeks.  Diagnosis:     Axis I: Bipolar 1 Disorder, Mixed, Moderate and Dissociative Amnesia, Grief, and PTSD    Saara Kijowski A, LCSW 06/01/2016

## 2016-06-03 NOTE — Telephone Encounter (Signed)
Touched base with Dr [facility] office and he has said they can hold the plavix, we are just waiting to hear how many days to hold it.  He is out of the office today and will return Monday.  They requested that I refax the anti-coag letter over to his nurse Ashley's attention.  We have done that and will await an answer.

## 2016-06-06 NOTE — Telephone Encounter (Signed)
Dr Bettina Gavia sent back a fax saying ok to hold Plavix 5 days prior to colonoscopy. He verbalized understanding.  He got papers from the insurance company about payment for his colon and his Drs office saying that his hemoccult was positive with his physcal he recently had.  He is going to bring all these papers when he comes.

## 2016-06-08 ENCOUNTER — Encounter (HOSPITAL_COMMUNITY): Payer: Self-pay | Admitting: Clinical

## 2016-06-09 ENCOUNTER — Ambulatory Visit (INDEPENDENT_AMBULATORY_CARE_PROVIDER_SITE_OTHER): Payer: Medicare Other | Admitting: Family

## 2016-06-09 ENCOUNTER — Encounter: Payer: Self-pay | Admitting: Family

## 2016-06-09 ENCOUNTER — Other Ambulatory Visit (INDEPENDENT_AMBULATORY_CARE_PROVIDER_SITE_OTHER): Payer: Medicare Other

## 2016-06-09 VITALS — BP 118/80 | HR 70 | Temp 97.8°F | Resp 16 | Ht 69.0 in | Wt 165.1 lb

## 2016-06-09 DIAGNOSIS — Z125 Encounter for screening for malignant neoplasm of prostate: Secondary | ICD-10-CM

## 2016-06-09 DIAGNOSIS — Z23 Encounter for immunization: Secondary | ICD-10-CM | POA: Diagnosis not present

## 2016-06-09 DIAGNOSIS — I10 Essential (primary) hypertension: Secondary | ICD-10-CM

## 2016-06-09 DIAGNOSIS — Z0000 Encounter for general adult medical examination without abnormal findings: Secondary | ICD-10-CM

## 2016-06-09 DIAGNOSIS — R413 Other amnesia: Secondary | ICD-10-CM

## 2016-06-09 DIAGNOSIS — F319 Bipolar disorder, unspecified: Secondary | ICD-10-CM

## 2016-06-09 LAB — COMPREHENSIVE METABOLIC PANEL
ALBUMIN: 5 g/dL (ref 3.5–5.2)
ALK PHOS: 71 U/L (ref 39–117)
ALT: 31 U/L (ref 0–53)
AST: 37 U/L (ref 0–37)
BUN: 21 mg/dL (ref 6–23)
CALCIUM: 9.9 mg/dL (ref 8.4–10.5)
CHLORIDE: 103 meq/L (ref 96–112)
CO2: 30 mEq/L (ref 19–32)
Creatinine, Ser: 0.82 mg/dL (ref 0.40–1.50)
GFR: 98.71 mL/min (ref >60.00)
Glucose, Bld: 108 mg/dL — ABNORMAL HIGH (ref 70–99)
POTASSIUM: 4.1 meq/L (ref 3.5–5.1)
Sodium: 139 mEq/L (ref 135–145)
TOTAL PROTEIN: 7.4 g/dL (ref 6.0–8.3)
Total Bilirubin: 1 mg/dL (ref 0.2–1.2)

## 2016-06-09 LAB — PSA: PSA: 1.36 ng/mL (ref 0.10–4.00)

## 2016-06-09 MED ORDER — ACETAMINOPHEN-CODEINE #3 300-30 MG PO TABS: 1.0000 | ORAL_TABLET | Freq: Three times a day (TID) | ORAL | 0 refills | Status: DC | PRN

## 2016-06-09 NOTE — Assessment & Plan Note (Signed)
Reviewed and updated patient's medical, surgical, family and social history. Medications and allergies were also reviewed. Basic screenings for depression, activities of daily living, hearing, cognition and safety were performed. Provider list was updated and health plan was provided to the patient.  

## 2016-06-09 NOTE — Assessment & Plan Note (Signed)
Blood pressure below goal 140/90 with current regimen and no adverse side effects. Continue current dosage of diltiazem. Encouraged to monitor blood pressure at home and follow low-sodium diet. Continue to monitor.

## 2016-06-09 NOTE — Assessment & Plan Note (Signed)
1) Anticipatory Guidance: Discussed importance of wearing a seatbelt while driving and not texting while driving; changing batteries in smoke detector at least once annually; wearing suntan lotion when outside; eating a balanced and moderate diet; getting physical activity at least 30 minutes per day.  2) Immunizations / Screenings / Labs:  Pneumovax updated today. All other immunizations are up-to-date per recommendations. Due for a prostate cancer screening with PSA ordered. Due for colon cancer screening with colonoscopy scheduled. Obtain hepatitis C antibody for hepatitis C screening. Refer to neuropsychology for memory testing. Due for a dental and vision screen encouraged to be completed independently. Obtain complete metabolic profile. All other blood work previously completed and reviewed.  Overall well exam with risk factors for cardiovascular disease including hypertension and hyperlipidemia. Chronic conditions appear adequately controlled through current medication regimens. He is of good weight. Encouraged continued physical activity. Continue other healthy lifestyle behaviors and choices. Follow-up prevention exam in 1 year. Follow-up office visit for chronic conditions as needed.

## 2016-06-09 NOTE — Assessment & Plan Note (Signed)
Appears stable with current regimen and maintained through psychiatry and psychology. Continue current medication and counseling with changes per psychiatry and psychology.

## 2016-06-09 NOTE — Patient Instructions (Signed)
Thank you for choosing [facility].  SUMMARY AND INSTRUCTIONS:  Medication:  Please continue to take your medications as prescribed.   Your prescription(s) have been submitted to your pharmacy or been printed and provided for you. Please take as directed and contact our office if you believe you are having problem(s) with the medication(s) or have any questions.  Labs:  Please stop by the lab on the lower level of the building for your blood work. Your results will be released to [facility] (or called to you) after review, usually within 72 hours after test completion. If any changes need to be made, you will be notified at that same time.  1.) The lab is open from 7:30am to 5:30 pm Monday-Friday 2.) No appointment is necessary 3.) Fasting (if needed) is 6-8 hours after food and drink; black coffee and water are okay   Referrals:  They will call to schedule your appointment with the neuropsychology.   Referrals have been made during this visit. You should expect to hear back from our schedulers in about 7-10 days in regards to establishing an appointment with the specialists we discussed.   Follow up:  If your symptoms worsen or fail to improve, please contact our office for further instruction, or in case of emergency go directly to the emergency room at the closest medical facility.    Health Maintenance, Male A healthy lifestyle and preventative care can promote health and wellness.  Maintain regular health, dental, and eye exams.  Eat a healthy diet. Foods like vegetables, fruits, whole grains, low-fat dairy products, and lean protein foods contain the nutrients you need and are low in calories. Decrease your intake of foods high in solid fats, added sugars, and salt. Get information about a proper diet from your health care provider, if necessary.  Regular physical exercise is one of the most important things you can do for your health. Most adults should get at least 150  minutes of moderate-intensity exercise (any activity that increases your heart rate and causes you to sweat) each week. In addition, most adults need muscle-strengthening exercises on 2 or more days a week.   Maintain a healthy weight. The body mass index (BMI) is a screening tool to identify possible weight problems. It provides an estimate of body fat based on height and weight. Your health care provider can find your BMI and can help you achieve or maintain a healthy weight. For males 20 years and older:  A BMI below 18.5 is considered underweight.  A BMI of 18.5 to 24.9 is normal.  A BMI of 25 to 29.9 is considered overweight.  A BMI of 30 and above is considered obese.  Maintain normal blood lipids and cholesterol by exercising and minimizing your intake of saturated fat. Eat a balanced diet with plenty of fruits and vegetables. Blood tests for lipids and cholesterol should begin at age 10 and be repeated every 5 years. If your lipid or cholesterol levels are high, you are over age 53, or you are at high risk for heart disease, you may need your cholesterol levels checked more frequently.Ongoing high lipid and cholesterol levels should be treated with medicines if diet and exercise are not working.  If you smoke, find out from your health care provider how to quit. If you do not use tobacco, do not start.  Lung cancer screening is recommended for adults aged 41-80 years who are at high risk for developing lung cancer because of a history of smoking.  A yearly low-dose CT scan of the lungs is recommended for people who have at least a 30-pack-year history of smoking and are current smokers or have quit within the past 15 years. A pack year of smoking is smoking an average of 1 pack of cigarettes a day for 1 year (for example, a 30-pack-year history of smoking could mean smoking 1 pack a day for 30 years or 2 packs a day for 15 years). Yearly screening should continue until the smoker has stopped  smoking for at least 15 years. Yearly screening should be stopped for people who develop a health problem that would prevent them from having lung cancer treatment.  If you choose to drink alcohol, do not have more than 2 drinks per day. One drink is considered to be 12 oz (360 mL) of beer, 5 oz (150 mL) of wine, or 1.5 oz (45 mL) of liquor.  Avoid the use of street drugs. Do not share needles with anyone. Ask for help if you need support or instructions about stopping the use of drugs.  High blood pressure causes heart disease and increases the risk of stroke. High blood pressure is more likely to develop in:  People who have blood pressure in the end of the normal range (100-139/85-89 mm Hg).  People who are overweight or obese.  People who are African American.  If you are 33-65 years of age, have your blood pressure checked every 3-5 years. If you are 68 years of age or older, have your blood pressure checked every year. You should have your blood pressure measured twice--once when you are at a hospital or clinic, and once when you are not at a hospital or clinic. Record the average of the two measurements. To check your blood pressure when you are not at a hospital or clinic, you can use:  An automated blood pressure machine at a pharmacy.  A home blood pressure monitor.  If you are 37-54 years old, ask your health care provider if you should take aspirin to prevent heart disease.  Diabetes screening involves taking a blood sample to check your fasting blood sugar level. This should be done once every 3 years after age 8 if you are at a normal weight and without risk factors for diabetes. Testing should be considered at a younger age or be carried out more frequently if you are overweight and have at least 1 risk factor for diabetes.  Colorectal cancer can be detected and often prevented. Most routine colorectal cancer screening begins at the age of 57 and continues through age 9.  However, your health care provider may recommend screening at an earlier age if you have risk factors for colon cancer. On a yearly basis, your health care provider may provide home test kits to check for hidden blood in the stool. A small camera at the end of a tube may be used to directly examine the colon (sigmoidoscopy or colonoscopy) to detect the earliest forms of colorectal cancer. Talk to your health care provider about this at age 63 when routine screening begins. A direct exam of the colon should be repeated every 5-10 years through age 8, unless early forms of precancerous polyps or small growths are found.  People who are at an increased risk for hepatitis B should be screened for this virus. You are considered at high risk for hepatitis B if:  You were born in a country where hepatitis B occurs often. Talk with your health care provider  about which countries are considered high risk.  Your parents were born in a high-risk country and you have not received a shot to protect against hepatitis B (hepatitis B vaccine).  You have HIV or AIDS.  You use needles to inject street drugs.  You live with, or have sex with, someone who has hepatitis B.  You are a man who has sex with other men (MSM).  You get hemodialysis treatment.  You take certain medicines for conditions like cancer, organ transplantation, and autoimmune conditions.  Hepatitis C blood testing is recommended for all people born from [birth year] through 1965 and any individual with known risk factors for hepatitis C.  Healthy men should no longer receive prostate-specific antigen (PSA) blood tests as part of routine cancer screening. Talk to your health care provider about prostate cancer screening.  Testicular cancer screening is not recommended for adolescents or adult males who have no symptoms. Screening includes self-exam, a health care provider exam, and other screening tests. Consult with your health care provider about  any symptoms you have or any concerns you have about testicular cancer.  Practice safe sex. Use condoms and avoid high-risk sexual practices to reduce the spread of sexually transmitted infections (STIs).  You should be screened for STIs, including gonorrhea and chlamydia if:  You are sexually active and are younger than 24 years.  You are older than 24 years, and your health care provider tells you that you are at risk for this type of infection.  Your sexual activity has changed since you were last screened, and you are at an increased risk for chlamydia or gonorrhea. Ask your health care provider if you are at risk.  If you are at risk of being infected with HIV, it is recommended that you take a prescription medicine daily to prevent HIV infection. This is called pre-exposure prophylaxis (PrEP). You are considered at risk if:  You are a man who has sex with other men (MSM).  You are a heterosexual man who is sexually active with multiple partners.  You take drugs by injection.  You are sexually active with a partner who has HIV.  Talk with your health care provider about whether you are at high risk of being infected with HIV. If you choose to begin PrEP, you should first be tested for HIV. You should then be tested every 3 months for as long as you are taking PrEP.  Use sunscreen. Apply sunscreen liberally and repeatedly throughout the day. You should seek shade when your shadow is shorter than you. Protect yourself by wearing long sleeves, pants, a wide-brimmed hat, and sunglasses year round whenever you are outdoors.  Tell your health care provider of new moles or changes in moles, especially if there is a change in shape or color. Also, tell your health care provider if a mole is larger than the size of a pencil eraser.  A one-time screening for abdominal aortic aneurysm (AAA) and surgical repair of large AAAs by ultrasound is recommended for men aged 41-75 years who are current  or former smokers.  Stay current with your vaccines (immunizations).   This information is not intended to replace advice given to you by your health care provider. Make sure you discuss any questions you have with your health care provider.   Document Released: 02/04/2008 Document Revised: 08/29/2014 Document Reviewed: 01/03/2011 Elsevier Interactive Patient Education 2016 Two [employer] Maintenance  Topic Date Due  . Hepatitis C Screening  07-09-1946  .  COLONOSCOPY  05/19/1996  . PNA vac Low Risk Adult (2 of 2 - PPSV23) 05/05/2017  . TETANUS/TDAP  03/25/2025  . INFLUENZA VACCINE  Completed  . ZOSTAVAX  Completed

## 2016-06-09 NOTE — Progress Notes (Signed)
Subjective:    Patient ID: Justin [name], male    DOB: September 17, 1945, 70 y.o.   MRN: [id number]  Chief Complaint  Patient presents with  . CPE    fasting    HPI:  Justin [name] is a 70 y.o. male who presents today for a Medicare Annual Wellness/Physical exam.    1) Health Maintenance -   Diet - Averages about 1-2 meals per day consisting of a regular diet. Caffeine intake of about 1-2 cups daily.   Exercise - Walks the dog for about 20-30 minutes per day   2) Preventative Exams / Immunizations:  Dental -- Due for exam   Vision -- Due for exam    Health Maintenance  Topic Date Due  . Hepatitis C Screening  17-May-1946  . COLONOSCOPY  05/19/1996  . PNA vac Low Risk Adult (2 of 2 - PPSV23) 05/05/2017  . TETANUS/TDAP  03/25/2025  . INFLUENZA VACCINE  Completed  . ZOSTAVAX  Completed     Immunization History  Administered Date(s) Administered  . Influenza, High Dose Seasonal PF 05/05/2016  . Influenza,inj,Quad PF,36+ Mos 06/26/2015  . Pneumococcal Conjugate-13 08/07/2015, 05/05/2016  . Pneumococcal Polysaccharide-23 06/09/2016  . Td 03/26/2015  . Zoster 03/26/2015    RISK FACTORS  Tobacco History  Smoking Status  . Never Smoker  Smokeless Tobacco  . Never Used     Cardiac risk factors: advanced age (older than 57 for men, 63 for women), hypertension and male gender.  Depression Screen  Depression screen PHQ 2/9 06/09/2016  Decreased Interest 1  Down, Depressed, Hopeless 1  PHQ - 2 Score 2  Altered sleeping 1  Tired, decreased energy 1  Change in appetite 1  Feeling bad or failure about yourself  1  Trouble concentrating 1  Moving slowly or fidgety/restless 1  Suicidal thoughts 0  PHQ-9 Score 8  Difficult doing work/chores Somewhat difficult  Some encounter information is confidential and restricted. Go to Review Flowsheets activity to see all data.     Activities of Daily Living In your present state of health, do you have any difficulty  performing the following activities?:  Driving? No Managing money?  No Feeding yourself? No Getting from bed to chair? No Climbing a flight of stairs? No Preparing food and eating?: No Bathing or showering? No Getting dressed: No Getting to the toilet? No Using the toilet: No Moving around from place to place: No In the past year have you fallen or had a near fall?:No   Home Safety Has smoke detector and wears seat belts. No firearms. No excess sun exposure. Are there smokers in your home (other than you)?  No Do you feel safe at home?  Yes  Hearing Difficulties: No Do you often ask people to speak up or repeat themselves? No Do you experience ringing or noises in your ears? No  Do you have difficulty understanding soft or whispered voices? No    Cognitive Testing  Alert? Yes   Normal Appearance? Yes  Oriented to person? Yes  Place? Yes   Time? Yes  Recall of three objects?  Yes  Can perform simple calculations? Yes  Displays appropriate judgment? Yes  Can read the correct time from a watch face? Yes  Do you feel that you have a problem with memory? Yes  Do you often misplace items? Yes  Psychology recommends a referral to neuropsychologist for evaluation.    Advanced Directives have been discussed with the patient? Yes   Current  Physicians/Providers and Suppliers  1. Terri Piedra, FNP - Internal Medicine 2. Francee Piccolo, LCSW - Counselor  3. Silvano Rusk, MD - Gastroenterology 4. Berniece Andreas, MD - Psychiatry 5. Welford Roche, MD - Cardiology  Indicate any recent Medical Services you may have received from other than [facility] providers in the past year (date may be approximate).  All answers were reviewed with the patient and necessary referrals were made:  Mauricio Po, [place]   06/09/2016    Allergies  Allergen Reactions  . Ambien [Zolpidem Tartrate] Other (See Comments)    Blackout, memory issues     Outpatient Medications Prior to Visit  Medication Sig  Dispense Refill  . ALPRAZolam (XANAX) 0.5 MG tablet Take 1 tablet (0.5 mg total) by mouth 2 (two) times daily. 60 tablet 1  . CARTIA XT 180 MG 24 hr capsule     . clopidogrel (PLAVIX) 75 MG tablet Take 75 mg by mouth daily.    Marland Kitchen lamoTRIgine (LAMICTAL) 200 MG tablet Take 1 tablet (200 mg total) by mouth daily. 30 tablet 1  . nitroGLYCERIN (NITROSTAT) 0.4 MG SL tablet     . rosuvastatin (CRESTOR) 20 MG tablet Take 20 mg by mouth daily.    . QUEtiapine (SEROQUEL) 100 MG tablet Take 1 tablet (100 mg total) by mouth at bedtime. 30 tablet 2   No facility-administered medications prior to visit.      Past Medical History:  Diagnosis Date  . Anxiety   . Arthritis   . Asthma   . Bipolar disorder ([place])   . CAD S/P percutaneous coronary stenting   . CHF (congestive heart failure) ([place])   . Chicken pox   . Colon polyps   . Depression   . Dissociative amnesia   . Grief   . Hyperlipidemia   . Migraines   . NSTEMI (non-ST elevated myocardial infarction) ([place])   . PTSD (post-traumatic stress disorder)      Past Surgical History:  Procedure Laterality Date  . CERVICAL DISCECTOMY    . ELBOW SURGERY Left   . ELBOW SURGERY Right   . INGUINAL HERNIA REPAIR Bilateral    1996, 1997  . KNEE ARTHROSCOPY Right   . KNEE CARTILAGE SURGERY Left   . NASAL SEPTUM SURGERY    . STENT PLACEMENT VASCULAR ([place] [facility])  09/02/2010  . TONSILLECTOMY       Family History  Problem Relation Age of Onset  . Arthritis Mother   . Heart disease Mother   . Hypertension Mother   . Alzheimer's disease Mother   . Heart attack Mother   . Arthritis Father   . Hyperlipidemia Father   . Heart disease Father   . Stroke Father   . Hypertension Father   . Heart attack Father   . Multiple sclerosis Sister   . Diabetes Sister   . Lung cancer Paternal Grandmother   . Diabetes Sister      Social History   Social History  . Marital status: Widowed    Spouse name: N/A  . Number of children: 2  . Years of  education: 12   Occupational History  . Retired    Social History Main Topics  . Smoking status: Never Smoker  . Smokeless tobacco: Never Used  . Alcohol use No  . Drug use: No  . Sexual activity: Not on file   Other Topics Concern  . Not on file   Social History Narrative   Retired, widowed in 2017    1  son one daughter   2 caffeinated beverages daily no alcohol or tobacco   Fun: Work out in the yard.   Denies religious beliefs effecting healthcare.      Review of Systems  Constitutional: Denies fever, chills, fatigue, or significant weight gain/loss. HENT: Head: Denies headache or neck pain Ears: Denies changes in hearing, ringing in ears, earache, drainage Nose: Denies discharge, stuffiness, itching, nosebleed, sinus pain Throat: Denies sore throat, hoarseness, dry mouth, sores, thrush Eyes: Denies loss/changes in vision, pain, redness, blurry/double vision, flashing lights Cardiovascular: Denies chest pain/discomfort, tightness, palpitations, shortness of breath with activity, difficulty lying down, swelling, sudden awakening with shortness of breath Respiratory: Denies shortness of breath, cough, sputum production, wheezing Gastrointestinal: Denies dysphasia, heartburn, change in appetite, nausea, change in bowel habits, rectal bleeding, constipation, diarrhea, yellow skin or eyes Genitourinary: Denies frequency, urgency, burning/pain, blood in urine, incontinence, change in urinary strength. Musculoskeletal: Denies muscle/joint pain, stiffness, back pain, redness or swelling of joints, trauma Skin: Denies rashes, lumps, itching, dryness, color changes, or hair/nail changes Neurological: Denies dizziness, fainting, seizures, weakness, numbness, tingling, tremor Psychiatric - Denies nervousness, stress, depression or memory loss Endocrine: Denies heat or cold intolerance, sweating, frequent urination, excessive thirst, changes in appetite Hematologic: Denies ease of  bruising or bleeding    Objective:     BP 118/80 (BP Location: Left Arm, Patient Position: Sitting, Cuff Size: Normal)   Pulse 70   Temp 97.8 F (36.6 C) (Oral)   Resp 16   Ht 5\' 9"  (1.753 m)   Wt 165 lb 1.9 oz (74.9 kg)   SpO2 98%   BMI 24.38 kg/m  Nursing note and vital signs reviewed.  Physical Exam  Constitutional: He is oriented to person, place, and time. He appears well-developed and well-nourished.  HENT:  Head: Normocephalic.  Right Ear: Hearing, tympanic membrane, external ear and ear canal normal.  Left Ear: Hearing, tympanic membrane, external ear and ear canal normal.  Nose: Nose normal.  Mouth/Throat: Uvula is midline, oropharynx is clear and moist and mucous membranes are normal.  Eyes: Conjunctivae and EOM are normal. Pupils are equal, round, and reactive to light.  Neck: Neck supple. No JVD present. No tracheal deviation present. No thyromegaly present.  Cardiovascular: Normal rate, regular rhythm, normal heart sounds and intact distal pulses.   Pulmonary/Chest: Effort normal and breath sounds normal.  Abdominal: Soft. Bowel sounds are normal. He exhibits no distension and no mass. There is no tenderness. There is no rebound and no guarding.  Musculoskeletal: Normal range of motion. He exhibits no edema or tenderness.  Lymphadenopathy:    He has no cervical adenopathy.  Neurological: He is alert and oriented to person, place, and time. He has normal reflexes. No cranial nerve deficit. He exhibits normal muscle tone. Coordination normal.  Skin: Skin is warm and dry.  Psychiatric: He has a normal mood and affect. His behavior is normal. Judgment and thought content normal.       Assessment & Plan:   During the course of the visit the patient was educated and counseled about appropriate screening and preventive services including:    Pneumococcal vaccine   Influenza vaccine  Td vaccine  Prostate cancer screening  Colorectal cancer  screening  Nutrition counseling   Diet review for nutrition referral? Yes ____  Not Indicated _X___   Patient Instructions (the written plan) was given to the patient.  Medicare Attestation I have personally reviewed: The patient's medical and social history Their use of alcohol, tobacco or  illicit drugs Their current medications and supplements The patient's functional ability including ADLs,fall risks, home safety risks, cognitive, and hearing and visual impairment Diet and physical activities Evidence for depression or mood disorders  The patient's weight, height, BMI,  have been recorded in the chart.  I have made referrals, counseling, and provided education to the patient based on review of the above and I have provided the patient with a written personalized care plan for preventive services.     Problem List Items Addressed This Visit      Cardiovascular and Mediastinum   Essential hypertension    Blood pressure below goal 140/90 with current regimen and no adverse side effects. Continue current dosage of diltiazem. Encouraged to monitor blood pressure at home and follow low-sodium diet. Continue to monitor.        Other   Routine general medical examination at a health care facility    1) Anticipatory Guidance: Discussed importance of wearing a seatbelt while driving and not texting while driving; changing batteries in smoke detector at least once annually; wearing suntan lotion when outside; eating a balanced and moderate diet; getting physical activity at least 30 minutes per day.  2) Immunizations / Screenings / Labs:  Pneumovax updated today. All other immunizations are up-to-date per recommendations. Due for a prostate cancer screening with PSA ordered. Due for colon cancer screening with colonoscopy scheduled. Obtain hepatitis C antibody for hepatitis C screening. Refer to neuropsychology for memory testing. Due for a dental and vision screen encouraged to be  completed independently. Obtain complete metabolic profile. All other blood work previously completed and reviewed.  Overall well exam with risk factors for cardiovascular disease including hypertension and hyperlipidemia. Chronic conditions appear adequately controlled through current medication regimens. He is of good weight. Encouraged continued physical activity. Continue other healthy lifestyle behaviors and choices. Follow-up prevention exam in 1 year. Follow-up office visit for chronic conditions as needed.       Relevant Orders   Hepatitis C antibody   Comprehensive metabolic panel (Completed)   Medicare annual wellness visit, subsequent - Primary    Reviewed and updated patient's medical, surgical, family and social history. Medications and allergies were also reviewed. Basic screenings for depression, activities of daily living, hearing, cognition and safety were performed. Provider list was updated and health plan was provided to the patient.        Bipolar I disorder ([place])    Appears stable with current regimen and maintained through psychiatry and psychology. Continue current medication and counseling with changes per psychiatry and psychology.      Memory changes   Relevant Orders   Ambulatory referral to Neuropsychology    Other Visit Diagnoses    Screening for prostate cancer       Relevant Orders   PSA (Completed)   Need for 23-polyvalent pneumococcal polysaccharide vaccine       Relevant Orders   Pneumococcal polysaccharide vaccine 23-valent greater than or equal to 2yo subcutaneous/IM (Completed)       I have discontinued Mr. Jeancharles's QUEtiapine. I am also having him start on acetaminophen-codeine. Additionally, I am having him maintain his rosuvastatin, clopidogrel, CARTIA XT, nitroGLYCERIN, lamoTRIgine, and ALPRAZolam.   Meds ordered this encounter  Medications  . acetaminophen-codeine (TYLENOL #3) 300-30 MG tablet    Sig: Take 1 tablet by mouth every 8  (eight) hours as needed for moderate pain.    Dispense:  60 tablet    Refill:  0    Order Specific Question:  Supervising Provider    Answer:   Pricilla Holm A [id number]     Follow-up: Return in about 3 months (around 09/09/2016), or if symptoms worsen or fail to improve.   Mauricio Po, FNP

## 2016-06-10 LAB — HEPATITIS C ANTIBODY: HCV Ab: NEGATIVE

## 2016-06-13 ENCOUNTER — Ambulatory Visit (INDEPENDENT_AMBULATORY_CARE_PROVIDER_SITE_OTHER): Payer: 59 | Admitting: Clinical

## 2016-06-13 ENCOUNTER — Telehealth (HOSPITAL_COMMUNITY): Payer: Self-pay

## 2016-06-13 DIAGNOSIS — F4310 Post-traumatic stress disorder, unspecified: Secondary | ICD-10-CM

## 2016-06-13 DIAGNOSIS — F319 Bipolar disorder, unspecified: Secondary | ICD-10-CM

## 2016-06-13 DIAGNOSIS — F4320 Adjustment disorder, unspecified: Secondary | ICD-10-CM | POA: Diagnosis not present

## 2016-06-13 DIAGNOSIS — F4321 Adjustment disorder with depressed mood: Secondary | ICD-10-CM

## 2016-06-13 DIAGNOSIS — F440 Dissociative amnesia: Secondary | ICD-10-CM | POA: Diagnosis not present

## 2016-06-13 NOTE — Telephone Encounter (Signed)
Patient was here to see therapist today and stopped by my office. Patient says he has stopped taking the Seroquel as it was causing him to have nightmares and sleepwalk. Patient says he has done better on Trazodone in the past. Please review and advise, thank you

## 2016-06-13 NOTE — Progress Notes (Signed)
   THERAPIST PROGRESS NOTE  Session Time: 2:31 - 3:28   Participation Level: Active  Behavioral Response: CasualAlertDepressed  Type of Therapy: Individual Therapy  Treatment [place] addressed: improve psychiatric symptoms, Controlled Behavior, Moderated Mood, Deliberative Speech (improved social functioning), Improve Unhelpful Thought Patterns, Emotional Regulation Skills), Feel and express a full Range of Emotions, Learn about Diagnosis, Healthy Coping Skills,    Interventions: CBT, Motivational Interviewing, grounding techniques and mindfulness techniques, and psychoeducation  Summary: Pasco Marchitto is a 70 y.o. male who presents with Bipolar 1 Disorder, Mixed, Moderate and Dissociative Amnesia, Grief, and PTSD  Suicidal/Homicidal: No - without intent/plan  Therapist Response:  Roberta met with clinician for an individual session. Doyt discussed his psychiatric symptoms, his current life events and his homework. Candy shared that he went to court and his son and daughter-in-law press charges. He shared that he was thankful that clinician had written him a letter because he was not required to do jail time. He shared his hurt feelings and emotions a bout both forgetting what happened in the hospital and his son and daughter-in-law pressing charges. Issai shared his confusion about his relationship with his son. They press charges but then passed then invited him over for dinner and such since. His son charges him for helping him though he helps them for free.Marland Kitchen  He shared that his son and daughter in laws behavior make some uneasy. He shared that he asked his son to take him for his colonoscopy but that he has concerns since the medicine make you forget things like signing papers etc. Clinician asked open ended questions and Kamareon identified his grandchildren as his motivators or maintaining the relationship. He also identified some alternative to having his son drive him to his  appointment. He did shared that he went with his son and daughter in law to a church outing to feed homeless people. He shared that this made him feel good. Client and clinician discussed how doing for others or doing new activities decreases depression. Tyshun shared that he completed bipolar packet #3. Client and clinician discussed his symptoms and triggers for depression and mania relapse. Client and clinician discussed other information in the packet applied to [place]. Clinician gave Ashleigh packet #4 which she agreed to complete before next session and bring back with him.  Plan: Return again in 1 -2 weeks.  Diagnosis:     Axis I: Bipolar 1 Disorder, Mixed, Moderate and Dissociative Amnesia, Grief, and PTSD    Eshika Reckart A, LCSW 06/13/2016

## 2016-06-14 ENCOUNTER — Other Ambulatory Visit (HOSPITAL_COMMUNITY): Payer: Self-pay

## 2016-06-14 MED ORDER — TRAZODONE HCL 50 MG PO TABS: 50.0000 mg | ORAL_TABLET | Freq: Every day | ORAL | 2 refills | Status: DC

## 2016-06-14 NOTE — Telephone Encounter (Signed)
Patient complaining of nightmares with Seroquel which was given on his last appointment.  He can try trazodone 50 mg at bedtime and discontinue Seroquel.  If symptoms do not improve then he need to call us back.

## 2016-06-14 NOTE — Progress Notes (Signed)
Sent Trazodone to the pharmacy per Dr. Adele Schilder, I called the patient and let him know

## 2016-06-15 ENCOUNTER — Ambulatory Visit (AMBULATORY_SURGERY_CENTER): Payer: Medicare Other | Admitting: Internal Medicine

## 2016-06-15 ENCOUNTER — Encounter: Payer: Self-pay | Admitting: Internal Medicine

## 2016-06-15 ENCOUNTER — Encounter (HOSPITAL_COMMUNITY): Payer: Self-pay | Admitting: Clinical

## 2016-06-15 VITALS — BP 109/67 | HR 56 | Temp 98.9°F | Resp 10 | Ht 69.0 in | Wt 162.0 lb

## 2016-06-15 DIAGNOSIS — D123 Benign neoplasm of transverse colon: Secondary | ICD-10-CM | POA: Diagnosis not present

## 2016-06-15 DIAGNOSIS — Z86010 Personal history of colonic polyps: Secondary | ICD-10-CM

## 2016-06-15 DIAGNOSIS — D125 Benign neoplasm of sigmoid colon: Secondary | ICD-10-CM

## 2016-06-15 DIAGNOSIS — D124 Benign neoplasm of descending colon: Secondary | ICD-10-CM

## 2016-06-15 DIAGNOSIS — D122 Benign neoplasm of ascending colon: Secondary | ICD-10-CM

## 2016-06-15 MED ORDER — SODIUM CHLORIDE 0.9 % IV SOLN: 500.0000 mL | INTRAVENOUS | Status: DC

## 2016-06-15 NOTE — Patient Instructions (Addendum)
I removed 4 polyps today. I will let you know pathology results and when to have another routine colonoscopy by mail.  You also have a condition called diverticulosis - common and not usually a problem. Please read the handout provided.  I appreciate the opportunity to care for you. Gatha Mayer, MD, FACG  YOU HAD AN ENDOSCOPIC PROCEDURE TODAY AT [facility]:   Refer to the procedure report that was given to you for any specific questions about what was found during the examination.  If the procedure report does not answer your questions, please call your gastroenterologist to clarify.  If you requested that your care partner not be given the details of your procedure findings, then the procedure report has been included in a sealed envelope for you to review at your convenience later.  YOU SHOULD EXPECT: Some feelings of bloating in the abdomen. Passage of more gas than usual.  Walking can help get rid of the air that was put into your GI tract during the procedure and reduce the bloating. If you had a lower endoscopy (such as a colonoscopy or flexible sigmoidoscopy) you may notice spotting of blood in your stool or on the toilet paper. If you underwent a bowel prep for your procedure, you may not have a normal bowel movement for a few days.  Please Note:  You might notice some irritation and congestion in your nose or some drainage.  This is from the oxygen used during your procedure.  There is no need for concern and it should clear up in a day or so.  SYMPTOMS TO REPORT IMMEDIATELY:   Following lower endoscopy (colonoscopy or flexible sigmoidoscopy):  Excessive amounts of blood in the stool  Significant tenderness or worsening of abdominal pains  Swelling of the abdomen that is new, acute  Fever of 100F or higher   Following upper endoscopy (EGD)  Vomiting of blood or coffee ground material  New chest pain or pain under the shoulder blades  Painful or  persistently difficult swallowing  New shortness of breath  Fever of 100F or higher  Black, tarry-looking stools  For urgent or emergent issues, a gastroenterologist can be reached at any hour by calling [phone].   DIET:  We do recommend a small meal at first, but then you may proceed to your regular diet.  Drink plenty of fluids but you should avoid alcoholic beverages for 24 hours.  ACTIVITY:  You should plan to take it easy for the rest of today and you should NOT DRIVE or use heavy machinery until tomorrow (because of the sedation medicines used during the test).    FOLLOW UP: Our staff will call the number listed on your records the next business day following your procedure to check on you and address any questions or concerns that you may have regarding the information given to you following your procedure. If we do not reach you, we will leave a message.  However, if you are feeling well and you are not experiencing any problems, there is no need to return our call.  We will assume that you have returned to your regular daily activities without incident.  If any biopsies were taken you will be contacted by phone or by letter within the next 1-3 weeks.  Please call us at [phone] if you have not heard about the biopsies in 3 weeks.    SIGNATURES/CONFIDENTIALITY: You and/or your care partner have signed paperwork which will be  entered into your electronic medical record.  These signatures attest to the fact that that the information above on your After Visit Summary has been reviewed and is understood.  Full responsibility of the confidentiality of this discharge information lies with you and/or your care-partner.  Polyp and diverticulosis information given.

## 2016-06-15 NOTE — Progress Notes (Signed)
Report to PACU, RN, vss, BBS= Clear.  

## 2016-06-15 NOTE — Op Note (Signed)
[place] Patient Name: Justin [name] Procedure Date: 06/15/2016 3:03 PM MRN: [id number] Endoscopist: Gatha Mayer , MD Age: 70 Referring MD:  Date of Birth: February 27, 1946 Gender: Male Account #: [id number] Procedure:                Colonoscopy Indications:              High risk colon cancer surveillance: Personal                            history of colonic polyps Medicines:                Propofol per Anesthesia, Monitored Anesthesia Care Procedure:                Pre-Anesthesia Assessment:                           - Prior to the procedure, a History and Physical                            was performed, and patient medications and                            allergies were reviewed. The patient's tolerance of                            previous anesthesia was also reviewed. The risks                            and benefits of the procedure and the sedation                            options and risks were discussed with the patient.                            All questions were answered, and informed consent                            was obtained. Prior Anticoagulants: The patient                            last took Plavix (clopidogrel) 9 days prior to the                            procedure. ASA Grade Assessment: II - A patient                            with mild systemic disease. After reviewing the                            risks and benefits, the patient was deemed in                            satisfactory condition to undergo the procedure.  After obtaining informed consent, the colonoscope                            was passed under direct vision. Throughout the                            procedure, the patient's blood pressure, pulse, and                            oxygen saturations were monitored continuously. The                            Model CF-HQ190L [id number]) scope was introduced                            through the  anus and advanced to the the cecum,                            identified by appendiceal orifice and ileocecal                            valve. The colonoscopy was performed without                            difficulty. The patient tolerated the procedure                            well. The quality of the bowel preparation was                            good. The ileocecal valve, appendiceal orifice, and                            rectum were photographed. The bowel preparation                            used was Miralax. Scope In: 3:12:44 PM Scope Out: 3:31:18 PM Scope Withdrawal Time: 0 hours 14 minutes 24 seconds  Total Procedure Duration: 0 hours 18 minutes 34 seconds  Findings:                 The perianal and digital rectal examinations were                            normal. Pertinent negatives include normal prostate                            (size, shape, and consistency).                           Three sessile and semi-pedunculated polyps were                            found in the descending colon, transverse colon and  ascending colon. The polyps were 3 to 8 mm in size.                            These polyps were removed with a cold snare.                            Resection and retrieval were complete. Verification                            of patient identification for the specimen was                            done. Estimated blood loss was minimal.                           A 10 mm polyp was found in the sigmoid colon. The                            polyp was pedunculated. The polyp was removed with                            a hot snare. Resection and retrieval were complete.                            Verification of patient identification for the                            specimen was done. Estimated blood loss: none.                           Multiple diverticula were found in the sigmoid                            colon. There was  narrowing of the colon in                            association with the diverticular opening.                           The exam was otherwise without abnormality on                            direct and retroflexion views. Complications:            No immediate complications. Estimated Blood Loss:     Estimated blood loss was minimal. Impression:               - Three 3 to 8 mm polyps in the descending colon,                            in the transverse colon and in the ascending colon,  removed with a cold snare. Resected and retrieved.                           - One 10 mm polyp in the sigmoid colon, removed                            with a hot snare. Resected and retrieved.                           - Severe diverticulosis in the sigmoid colon. There                            was narrowing of the colon in association with the                            diverticular opening.                           - The examination was otherwise normal on direct                            and retroflexion views. Recommendation:           - Patient has a contact number available for                            emergencies. The signs and symptoms of potential                            delayed complications were discussed with the                            patient. Return to normal activities tomorrow.                            Written discharge instructions were provided to the                            patient.                           - Resume previous diet.                           - Continue present medications.                           - Repeat colonoscopy is recommended. The                            colonoscopy date will be determined after pathology                            results from today's exam become available for  review.                           - Resume Plavix (clopidogrel) at prior dose                             tomorrow. Gatha Mayer, MD 06/15/2016 3:38:49 PM This report has been signed electronically.

## 2016-06-15 NOTE — Progress Notes (Signed)
Called to room to assist during endoscopic procedure.  Patient ID and intended procedure confirmed with present staff. Received instructions for my participation in the procedure from the performing physician.  

## 2016-06-16 ENCOUNTER — Telehealth: Payer: Self-pay

## 2016-06-16 NOTE — Telephone Encounter (Signed)
  Follow up Call-  Call back number 06/15/2016  Post procedure Call Back phone  # [phone]  Permission to leave phone message Yes  Some recent data might be hidden     Patient questions:  Do you have a fever, pain , or abdominal swelling? No. Pain Score  0 *  Have you tolerated food without any problems? Yes.    Have you been able to return to your normal activities? Yes.    Do you have any questions about your discharge instructions: Diet   No. Medications  No. Follow up visit  No.  Do you have questions or concerns about your Care? No.  Actions: * If pain score is 4 or above: No action needed, pain <4.

## 2016-06-20 ENCOUNTER — Ambulatory Visit (INDEPENDENT_AMBULATORY_CARE_PROVIDER_SITE_OTHER): Payer: 59 | Admitting: Clinical

## 2016-06-20 DIAGNOSIS — F4310 Post-traumatic stress disorder, unspecified: Secondary | ICD-10-CM | POA: Diagnosis not present

## 2016-06-20 DIAGNOSIS — F4321 Adjustment disorder with depressed mood: Secondary | ICD-10-CM

## 2016-06-20 DIAGNOSIS — F440 Dissociative amnesia: Secondary | ICD-10-CM | POA: Diagnosis not present

## 2016-06-20 DIAGNOSIS — F319 Bipolar disorder, unspecified: Secondary | ICD-10-CM | POA: Diagnosis not present

## 2016-06-20 DIAGNOSIS — F4320 Adjustment disorder, unspecified: Secondary | ICD-10-CM

## 2016-06-20 NOTE — Progress Notes (Signed)
   THERAPIST PROGRESS NOTE  Session Time: 2:34 - 3:30  Participation Level: Active  Behavioral Response: CasualAlertDepressed  Type of Therapy: Individual Therapy  Treatment Goals addressed: improve psychiatric symptoms,  Improve Unhelpful Thought Patterns, Emotional Regulation Skills (Moderate moods, anger management, stress management),Learn about Diagnosis, Healthy Coping Skills, Recall the Traumatic event without being overwhelmed    Interventions: CBT, Motivational Interviewing,and psychoeducation  Summary: Justin [name] is [name] 70 y.o. male who presents with Bipolar 1 Disorder, Mixed, Moderate and Dissociative Amnesia, Grief, and PTSD  Suicidal/Homicidal: No - without intent/plan  Therapist Response:  Justin [name] met with clinician for an individual session. Justin [name] discussed his psychiatric symptoms and his current life events. Justin [name] shared that he has had some okay days and some really challenging days. Clinician asked open ended questions. Justin [name] shared that he continues to have [name] lots of sadness due to relationship problems with his son. He shared about his sons behaviors and his responses. Justin [name] and clinician discussed healthy boundaries. Justin [name] shared that he has experienced [name] lot of rumination about past trauma. Justin [name] and clinician discussed PTSD and its symptoms Clinician asked open ended questions and Justin [name] shared about the trauma and the meaning he had given to it. Clinician asked open ended questions and Justin [name] shared additional information about the trauma and evidence against the meaning he gave it. Justin [name] and clinician discussed his thoughts and insights. Justin [name] had the insight that while the trauma existed their was also love and support.Justin [name] and clinician discussed how this insight affected Justin [name]. Justin [name] shared that while it did not fix everything, it did bring some relief to remember some of the positive. Justin [name] and clinician agreed to work on his trauma further at [name]  future date. Justin [name] and clinician discussed anger and stress management skills. Justin [name] agreed to practice his grounding and mindfulness as well as continue to challenge his negative thoughts. In addition Justin [name] agreed to look into some opportunities to expand his social circle.  Plan: Return again in 1 -2 weeks.  Diagnosis:     Axis I: Bipolar 1 Disorder, Mixed, Moderate and Dissociative Amnesia, Grief, and PTSD    Justin Sandiford A, LCSW 06/20/2016

## 2016-06-22 ENCOUNTER — Encounter: Payer: Self-pay | Admitting: Internal Medicine

## 2016-06-22 DIAGNOSIS — Z86010 Personal history of colonic polyps: Secondary | ICD-10-CM

## 2016-06-22 DIAGNOSIS — Z860101 Personal history of adenomatous and serrated colon polyps: Secondary | ICD-10-CM | POA: Insufficient documentation

## 2016-06-22 HISTORY — DX: Personal history of colonic polyps: Z86.010

## 2016-06-22 HISTORY — DX: Personal history of adenomatous and serrated colon polyps: Z86.0101

## 2016-06-27 ENCOUNTER — Encounter (HOSPITAL_COMMUNITY): Payer: Self-pay | Admitting: Clinical

## 2016-06-27 ENCOUNTER — Ambulatory Visit (INDEPENDENT_AMBULATORY_CARE_PROVIDER_SITE_OTHER): Payer: 59 | Admitting: Clinical

## 2016-06-27 DIAGNOSIS — F319 Bipolar disorder, unspecified: Secondary | ICD-10-CM

## 2016-06-27 DIAGNOSIS — F440 Dissociative amnesia: Secondary | ICD-10-CM

## 2016-06-27 DIAGNOSIS — F4310 Post-traumatic stress disorder, unspecified: Secondary | ICD-10-CM | POA: Diagnosis not present

## 2016-06-27 DIAGNOSIS — F4321 Adjustment disorder with depressed mood: Secondary | ICD-10-CM

## 2016-06-27 DIAGNOSIS — F4320 Adjustment disorder, unspecified: Secondary | ICD-10-CM

## 2016-06-27 NOTE — Progress Notes (Signed)
   THERAPIST PROGRESS NOTE  Session Time: 2:30 - 3:25  Participation Level: Active  Behavioral Response: CasualAlertDepressed  Type of Therapy: Individual Therapy  Treatment Goals addressed: improve psychiatric symptoms, Controlled Behavior, Moderated Mood, Deliberative Speech (improved social functioning), Improve Unhelpful Thought Patterns, Emotional Regulation Skills (Moderate moods, anger management, stress management), Feel and express a full Range of Emotions, Healthy Coping Skills,    Interventions: CBT, Motivational Interviewing,   Summary: Jacquese Hackman is a 70 y.o. male who presents with Bipolar 1 Disorder, Mixed, Moderate and Dissociative Amnesia, Grief, and PTSD  Suicidal/Homicidal: No - without intent/plan  Therapist Response:  Thiago met with clinician for an individual session. Barak discussed his psychiatric symptoms and his current life events. Damonta shared that he had a better week this past week than before. He shared that his son and daughter and law had been treating him better. He shared that he was enjoying the interaction but was also weary, fearful of their intentions. Client and clinician discussed Eldor's concerns. Client and clinician discussed the pros and cons of interacting with his family. Slaton shared that his biggest desire is to have a close relationship with with his children and grandchildren. He shared that he was invited to his daughter in laws p[arents house for thanksgiving. He shared he had concerns about making her family uncomfortable because he will not have his wife. He also shared concerns about them judging him about what happened at the hospital (which he does not remember). Clinician asked open ended questions and Damian shared his thoughts about how isolating would affect him, he stated his worse fears about attending. Client and clinician discussed what he could do if his worse fears were true. Client and clinician discussed healthy  coping skills. Labradford had the insight that he could handle his worse case scenarios and more than likely he would enjoy himself.   Plan: Return again in 1 -2 weeks.  Diagnosis:     Axis I: Bipolar 1 Disorder, Mixed, Moderate and Dissociative Amnesia, Grief, and PTSD    Auden Wettstein A, LCSW 06/27/2016

## 2016-06-29 ENCOUNTER — Encounter (HOSPITAL_COMMUNITY): Payer: Self-pay | Admitting: Clinical

## 2016-07-04 ENCOUNTER — Ambulatory Visit (INDEPENDENT_AMBULATORY_CARE_PROVIDER_SITE_OTHER): Payer: 59 | Admitting: Clinical

## 2016-07-04 DIAGNOSIS — F4321 Adjustment disorder with depressed mood: Secondary | ICD-10-CM

## 2016-07-04 DIAGNOSIS — F4320 Adjustment disorder, unspecified: Secondary | ICD-10-CM

## 2016-07-04 DIAGNOSIS — F319 Bipolar disorder, unspecified: Secondary | ICD-10-CM | POA: Diagnosis not present

## 2016-07-04 DIAGNOSIS — F4310 Post-traumatic stress disorder, unspecified: Secondary | ICD-10-CM

## 2016-07-04 DIAGNOSIS — F440 Dissociative amnesia: Secondary | ICD-10-CM

## 2016-07-04 NOTE — Progress Notes (Signed)
   THERAPIST PROGRESS NOTE  Session Time: 2:30 - 3:25  Participation Level: Active  Behavioral Response: CasualAlertNA  Type of Therapy: Individual Therapy  Treatment Goals addressed: improve psychiatric symptoms, Controlled Behavior, Moderated Mood, Deliberative Speech (improved social functioning), Improve Unhelpful Thought Patterns, Emotional Regulation Skills (Moderate moods), Feel and express a full Range of Emotions, Learn about Diagnosis, Healthy Coping Skills,    Interventions: CBT, Motivational Interviewing,  and psychoeducation  Summary: Nell Schrack is a 70 y.o. male who presents with Bipolar 1 Disorder, Mixed, Moderate and Dissociative Amnesia, Grief, and PTSD  Suicidal/Homicidal: No - without intent/plan  Therapist Response:  Nahsir met with clinician for an individual session. Issiac discussed his psychiatric symptoms and his current life events. Rojelio shared that he had ups and downs (symptom wise) since last session. He shared that he has been experiencing a lot of grief about his wife and his brother. Clinician asked open ended questions and Elijha shared about his experience. He shared that he is haunted by his behavior at the hospital that he can't remember but has been told about. Client and clinician discussed responses to stress and the grief process. Client and clinician discussed his negative automatic thoughts about himself. Client and clinician discussed the evidence for and against the beliefs. Clinician asked open ended questions and Dimitry was able to formulate healthier  (and kinder) alternative thoughts. Loreto shared that his son and daughter in law have been treating him kinder. He shared that he is weary about trusting completely. He shared he was asked to watch his grandchildren which he enjoyed lot.   Plan: Return again in 1 -2 weeks.  Diagnosis:     Axis I: Bipolar 1 Disorder, Mixed, Moderate and Dissociative Amnesia, Grief, and  PTSD   Kalyan Barabas A, LCSW 07/04/2016

## 2016-07-10 ENCOUNTER — Encounter (HOSPITAL_COMMUNITY): Payer: Self-pay | Admitting: Clinical

## 2016-07-11 ENCOUNTER — Ambulatory Visit (INDEPENDENT_AMBULATORY_CARE_PROVIDER_SITE_OTHER): Payer: 59 | Admitting: Clinical

## 2016-07-11 DIAGNOSIS — F4321 Adjustment disorder with depressed mood: Secondary | ICD-10-CM

## 2016-07-11 DIAGNOSIS — F440 Dissociative amnesia: Secondary | ICD-10-CM

## 2016-07-11 DIAGNOSIS — F4310 Post-traumatic stress disorder, unspecified: Secondary | ICD-10-CM | POA: Diagnosis not present

## 2016-07-11 DIAGNOSIS — F319 Bipolar disorder, unspecified: Secondary | ICD-10-CM | POA: Diagnosis not present

## 2016-07-11 DIAGNOSIS — F4320 Adjustment disorder, unspecified: Secondary | ICD-10-CM | POA: Diagnosis not present

## 2016-07-11 NOTE — Progress Notes (Signed)
   THERAPIST PROGRESS NOTE  Session Time:4:40 - 3:30  Participation Level: Active  Behavioral Response: CasualAlertDepressed  Type of Therapy: Individual Therapy  Treatment Goals addressed: improve psychiatric symptoms, Controlled Behavior, Moderated Mood, Deliberative Speech (improved social functioning), Improve Unhelpful Thought Patterns, Emotional Regulation Skills (Moderate moods, stress management), Feel and express a full Range of Emotions, Healthy Coping Skills,    Interventions: CBT, Motivational Interviewing  Summary: Justin [name] is a 70 y.o. male who presents with Bipolar 1 Disorder, Mixed, Moderate and Dissociative Amnesia, Grief, and PTSD  Suicidal/Homicidal: No - without intent/plan  Therapist Response:  Gabriele met with clinician for an individual session. [name] discussed his psychiatric symptoms, his current life events and his homework. [name] shared he has a Community education officer" of emotions. He shared that it has been 8 months(yesterday)  since his wife passed away. He shared that he has been missing her and grieving. [name] shared that his son and family continues to interact with him which feels good though not exactly safe yet. He stated that he went on an outing with them and felt himself stepping back and isolating. He stated that he then decided to try the skills discussed in session and faced his fear of rejection and engaged others rather than expecting them to engage him. Clinician asked open ended questions and and [name] identified his original negative thoughts, the way he challenged them, and the outcome. Client and clinician discussed how things become more comfortable with practice. Client and clinician discussed how to use the same skill with future challenges. He shared that he continues to ruminate about his behavior in the hospital. Client and clinician reviewed his beliefs about himself and how to challenge them. Client and clinician agreed to discuss them  further in future sessions.  Plan: Return again in 1 -2 weeks.  Diagnosis:     Axis I: Bipolar 1 Disorder, Mixed, Moderate and Dissociative Amnesia, Grief, and PTSD   Alesandra Smart A, LCSW 07/11/2016

## 2016-07-18 ENCOUNTER — Encounter (HOSPITAL_COMMUNITY): Payer: Self-pay | Admitting: Clinical

## 2016-07-18 ENCOUNTER — Ambulatory Visit (INDEPENDENT_AMBULATORY_CARE_PROVIDER_SITE_OTHER): Payer: 59 | Admitting: Clinical

## 2016-07-18 DIAGNOSIS — F440 Dissociative amnesia: Secondary | ICD-10-CM | POA: Diagnosis not present

## 2016-07-18 DIAGNOSIS — F4320 Adjustment disorder, unspecified: Secondary | ICD-10-CM | POA: Diagnosis not present

## 2016-07-18 DIAGNOSIS — F4310 Post-traumatic stress disorder, unspecified: Secondary | ICD-10-CM | POA: Diagnosis not present

## 2016-07-18 DIAGNOSIS — F319 Bipolar disorder, unspecified: Secondary | ICD-10-CM | POA: Diagnosis not present

## 2016-07-18 DIAGNOSIS — F4321 Adjustment disorder with depressed mood: Secondary | ICD-10-CM

## 2016-07-18 NOTE — Progress Notes (Signed)
   THERAPIST PROGRESS NOTE  Session Time: 2:29 -3:28  Participation Level: Active  Behavioral Response: CasualAlertDepressed   Type of Therapy: Individual Therapy  Treatment Goals addressed: improve psychiatric symptoms, Controlled Behavior, Moderated Mood, Deliberative Speech (improved social functioning), Improve Unhelpful Thought Patterns, Emotional Regulation Skills (Moderate moods,stress management), Feel and express a full Range of Emotions, Learn about Diagnosis, Healthy Coping Skills, Recall the Traumatic event without being overwhelmed   Interventions: CBT, Motivational Interviewing, grounding techniques and mindfulness techniques, and psychoeducation  Summary: Justin [name] is a 70 y.o. male who presents with Bipolar 1 Disorder, Mixed, Moderate and Dissociative Amnesia, Grief, and PTSD  Suicidal/Homicidal: No - without intent/plan  Therapist Response:  Justin [name] met with clinician for an individual session. Justin [name] discussed his psychiatric symptoms and his current life events. Justin [name] shared that he is having a difficult time with his emotions. He denied suicidal or homicidal ideation. He shared that he went to Thanksgiving with his family. He shared his challenges with socializing. He shared that spending a holiday without his wife was very difficult. He did share some "victories" of challenging himself to be the one to begin a conversation. Client and clinician discussed how this would become easier as practiced this. He stated that he continues to replay in his head what his daughter told him about his actions when he was dissociative at the hospital. He struggles to understand what happened to him and why he behaved the way he did. Clinician asked open ended questions and Justin [name] shared about his feeling of guilt and shame. Client and clinician discussed individuals reaction to stress. Client and clinician discussed stress reduction techniques. Justin [name] also shared about his childhood  trauma. He shared about feeling like there was something wrong with him that made it happen. Client and clinician discussed the evidence for this and against this belief. Client and clinician agreed to discuss this further at future sessions. Client and clinician practiced a grounding technique together  Plan: Return again in 1 -2 weeks.  Diagnosis:     Axis I: Bipolar 1 Disorder, Mixed, Moderate and Dissociative Amnesia, Grief, and PTSD   Laith Antonelli A, LCSW 07/18/2016

## 2016-07-25 ENCOUNTER — Encounter (HOSPITAL_COMMUNITY): Payer: Self-pay | Admitting: Clinical

## 2016-08-03 ENCOUNTER — Other Ambulatory Visit (HOSPITAL_COMMUNITY): Payer: Self-pay

## 2016-08-03 DIAGNOSIS — F319 Bipolar disorder, unspecified: Secondary | ICD-10-CM

## 2016-08-03 MED ORDER — LAMOTRIGINE 200 MG PO TABS: 200.0000 mg | ORAL_TABLET | Freq: Every day | ORAL | 2 refills | Status: DC

## 2016-08-03 MED ORDER — ALPRAZOLAM 0.5 MG PO TABS: 0.5000 mg | ORAL_TABLET | Freq: Two times a day (BID) | ORAL | 2 refills | Status: DC

## 2016-08-04 ENCOUNTER — Ambulatory Visit (HOSPITAL_COMMUNITY): Payer: Self-pay | Admitting: Psychiatry

## 2016-08-08 ENCOUNTER — Encounter (HOSPITAL_COMMUNITY): Payer: Self-pay | Admitting: Clinical

## 2016-08-08 ENCOUNTER — Ambulatory Visit (INDEPENDENT_AMBULATORY_CARE_PROVIDER_SITE_OTHER): Payer: 59 | Admitting: Clinical

## 2016-08-08 DIAGNOSIS — F4321 Adjustment disorder with depressed mood: Secondary | ICD-10-CM

## 2016-08-08 DIAGNOSIS — F319 Bipolar disorder, unspecified: Secondary | ICD-10-CM

## 2016-08-08 DIAGNOSIS — F4310 Post-traumatic stress disorder, unspecified: Secondary | ICD-10-CM | POA: Diagnosis not present

## 2016-08-08 DIAGNOSIS — F440 Dissociative amnesia: Secondary | ICD-10-CM

## 2016-08-08 DIAGNOSIS — F4320 Adjustment disorder, unspecified: Secondary | ICD-10-CM

## 2016-08-08 NOTE — Progress Notes (Signed)
   THERAPIST PROGRESS NOTE  Session Time: 9:58 - 11:00  Participation Level: Active  Behavioral Response: CasualAlertDepressed   Type of Therapy: Individual Therapy  Treatment Goals addressed: improve psychiatric symptoms, Controlled Behavior, Moderated Mood, Deliberative Speech (improved social functioning), Improve Unhelpful Thought Patterns, Emotional Regulation Skills (Moderate moods, stress management), Feel and express a full Range of Emotions,  Healthy Coping Skills,     Interventions: CBT, Motivational Interviewing, grounding techniques and mindfulness techniques, and psychoeducation  Summary: Cauy Melody is a 70 y.o. male who presents with Bipolar 1 Disorder, Mixed, Moderate and Dissociative Amnesia, Grief, and PTSD  Suicidal/Homicidal: No - without intent/plan  Therapist Response:  Freddrick met with clinician for an individual session. Jerrin discussed his psychiatric symptoms and his current life events. [name] shared that he was having a very difficult time with his depression. He shared that he was having difficulty with his family members. He shared about the situations that were making him feel "torn." Clinician asked open ended questions and [name] shared about the events and his negative automatic thoughts. Client and clinician discussed the evidence for and against the thoughts. [name] was then able to formulate healthier alternative thoughts. Client and clinician discussed grief. [name] shared that he was having difficulty allowing himself to feel his emotions. [name] shared the fear that if he doesn't feel bad and cry then she will think he doesn't care or miss her which he does deeply. Clinician asked open ended questions and [name] identified the falseness of this belief. [name] and clinician discussed making choices that were right for him while setting healthy boundaries.     Plan: Return again in 1 -2 weeks.  Diagnosis:     Axis I: Bipolar 1 Disorder, Mixed, Moderate and  Dissociative Amnesia, Grief, and PTSD   Markel Mergenthaler A, LCSW 08/08/2016

## 2016-08-18 ENCOUNTER — Ambulatory Visit (INDEPENDENT_AMBULATORY_CARE_PROVIDER_SITE_OTHER): Payer: 59 | Admitting: Clinical

## 2016-08-18 DIAGNOSIS — F4310 Post-traumatic stress disorder, unspecified: Secondary | ICD-10-CM | POA: Diagnosis not present

## 2016-08-18 DIAGNOSIS — F319 Bipolar disorder, unspecified: Secondary | ICD-10-CM

## 2016-08-18 DIAGNOSIS — F4320 Adjustment disorder, unspecified: Secondary | ICD-10-CM

## 2016-08-18 DIAGNOSIS — F4321 Adjustment disorder with depressed mood: Secondary | ICD-10-CM

## 2016-08-18 DIAGNOSIS — F440 Dissociative amnesia: Secondary | ICD-10-CM

## 2016-08-18 NOTE — Progress Notes (Signed)
   THERAPIST PROGRESS NOTE  Session Time: 1:30 - 2:25  Participation Level: Active  Behavioral Response: CasualAlertDepressed  Type of Therapy: Individual Therapy  Treatment Goals addressed: improve psychiatric symptoms, Controlled Behavior, Moderated Mood, Deliberative Speech (improved social functioning), Improve Unhelpful Thought Patterns, Emotional Regulation Skills (Moderate moods, anger management, stress management), Feel and express a full Range of Emotions, Healthy Coping Skills,    Interventions: CBT, Motivational Interviewing, grounding techniques and mindfulness techniques, and psychoeducation  Summary: Justin [name] is a 70 y.o. male who presents with Bipolar 1 Disorder, Mixed, Moderate and Dissociative Amnesia, Grief, and PTSD  Suicidal/Homicidal: No - without intent/plan  Therapist Response:  Justin [name] met with clinician for an individual session. Justin [name] discussed his psychiatric symptoms and his current life events. Justin [name] shared that his depression was up and down before Christmas and then he experienced some mania the two days prior to Christmas. Clinician asked open ended questions about his symptoms and the events that triggered his negative emotions. Justin [name] shared about his stressors, he shared about the anxiety that led to the mania. Client and clinician discussed his sleep cycle and how to improve it. Client and clinician discussed how to use his grounding and mindfulness techniques and how to use them and when to use them. Client and clinician discussed how to set and maintain healthy boundaries with his family ( he felt he was being torn in different directions). Justin [name] shared about a brief social interaction with a non family member and how it improved his mood. Once again client and clinician discussed the importance of social interactions and where Justin [name] might be able to connect with other with whom he has things in common. Justin [name] agreed to consider some options.     Plan: Return again in 1 -2 weeks.  Diagnosis:     Axis I: Bipolar 1 Disorder, Mixed, Moderate and Dissociative Amnesia, Grief, and PTSD   Azhar Yogi A, LCSW 08/18/2016

## 2016-08-19 ENCOUNTER — Encounter (HOSPITAL_COMMUNITY): Payer: Self-pay | Admitting: Clinical

## 2016-08-23 ENCOUNTER — Ambulatory Visit (INDEPENDENT_AMBULATORY_CARE_PROVIDER_SITE_OTHER): Payer: 59 | Admitting: Clinical

## 2016-08-23 DIAGNOSIS — F319 Bipolar disorder, unspecified: Secondary | ICD-10-CM | POA: Diagnosis not present

## 2016-08-23 DIAGNOSIS — F4320 Adjustment disorder, unspecified: Secondary | ICD-10-CM | POA: Diagnosis not present

## 2016-08-23 DIAGNOSIS — F4310 Post-traumatic stress disorder, unspecified: Secondary | ICD-10-CM | POA: Diagnosis not present

## 2016-08-23 DIAGNOSIS — F440 Dissociative amnesia: Secondary | ICD-10-CM

## 2016-08-23 DIAGNOSIS — F4321 Adjustment disorder with depressed mood: Secondary | ICD-10-CM

## 2016-08-23 NOTE — Progress Notes (Signed)
   THERAPIST PROGRESS NOTE  Session Time: 1:30 - 2:25   Participation Level: Active  Behavioral Response: CasualAlertDepressed  Type of Therapy: Individual Therapy  Treatment Goals addressed: improve psychiatric symptoms,  , Improve Unhelpful Thought Patterns,, Feel and express a full Range of Emotions, Learn about Diagnosis, Healthy Coping Skills, Recall the Traumatic event without being overwhelmed    Interventions: CBT, Motivational Interviewing,  psychoeducation  Summary: Justin [name] is a 71 y.o. male who presents with Bipolar 1 Disorder, Mixed, Moderate and Dissociative Amnesia, Grief, and PTSD  Suicidal/Homicidal: No - without intent/plan  Therapist Response:  Justin [name] met with clinician for an individual session. Justin [name] discussed his psychiatric symptoms and  his current life events. Justin [name] shared that he had emotional ups and downs over the holiday. He shared that he made a News Year Resolution to work diligently on the things that are interfering with his mental health. Clinician asked open ended questions and Justin [name] shared that he has been having dreams and flashbacks to his earlier trauma.. Clinician asked open ended questions and Justin [name] identified the trauma that has been upsetting to him. He shared that in addition to the trauma he has a lot of guilt. Justin [name] and clinician agreed on a trauma to work on. Justin [name] and clinician discussed the traumatic event. Clinician informed Justin [name] about PTSD and his symptoms.  Justin [name] and clinician discussed how his PTSD symptoms and Bipolar symptoms were overlapping and affecting each other. Clinician introduced an impact statement . Clinician informed [name] how to complete the form which he agreed to do for homework.   Plan: Return again in 1 -2 weeks.  Diagnosis:     Axis I: Bipolar 1 Disorder, Mixed, Moderate and Dissociative Amnesia, Grief, and PTSD    Kaylene Dawn A, LCSW 08/23/2016

## 2016-08-24 ENCOUNTER — Encounter (HOSPITAL_COMMUNITY): Payer: Self-pay | Admitting: Clinical

## 2016-08-30 ENCOUNTER — Ambulatory Visit (INDEPENDENT_AMBULATORY_CARE_PROVIDER_SITE_OTHER): Payer: 59 | Admitting: Clinical

## 2016-08-30 DIAGNOSIS — F440 Dissociative amnesia: Secondary | ICD-10-CM | POA: Diagnosis not present

## 2016-08-30 DIAGNOSIS — F4310 Post-traumatic stress disorder, unspecified: Secondary | ICD-10-CM | POA: Diagnosis not present

## 2016-08-30 DIAGNOSIS — F4320 Adjustment disorder, unspecified: Secondary | ICD-10-CM | POA: Diagnosis not present

## 2016-08-30 DIAGNOSIS — F4321 Adjustment disorder with depressed mood: Secondary | ICD-10-CM

## 2016-08-30 DIAGNOSIS — F319 Bipolar disorder, unspecified: Secondary | ICD-10-CM | POA: Diagnosis not present

## 2016-08-30 NOTE — Progress Notes (Signed)
   THERAPIST PROGRESS NOTE  Session Time: 1:30 - 2:25  Participation Level: Active  Behavioral Response: CasualAlertDepressed  Type of Therapy: Individual Therapy  Treatment Goals addressed: improve psychiatric symptoms, Controlled Behavior, Moderated Mood, Deliberative Speech (improved social functioning), Improve Unhelpful Thought Patterns, Emotional Regulation Skills (Moderate moods,  stress management), Feel and express a full Range of Emotions, Learn about Diagnosis, Healthy Coping Skills, Recall the Traumatic event without being overwhelmed    Interventions: Motivational Interviewing, grounding techniques and mindfulness techniques,   Summary: Justin [name] is a 71 y.o. male who presents with Bipolar 1 Disorder, Mixed, Moderate and Dissociative Amnesia, Grief, and PTSD  Suicidal/Homicidal: No - without intent/plan  Therapist Response:  [name] met with clinician for an individual session. [name] discussed his psychiatric symptoms, his current life events and his homework. [name] shared that he has had had some good days, and some mania and depression since last session.  [name] shared that he had completed his homework but forgot it at home. Client and clinician briefly discussed his homework about his past trauma. [name] agreed to bring it to next session. [name] shared that he has a day where he felt his grief intensely. Clinician asked open ended questions and [name] recognized that was processing his emotions appropriately. He shared that he became a little manic on Thursday he shared it was triggered by conflict within his family. When [name] discussed the conflict ( which he is not a part of) he became overwhelmed and confused. Client and confused. Client and clinician practiced a grounding technique together. Client and clinician discussed setting boundaries with others. Client and clinician discussed how this may help him with his stress and emotions. [name] shared some  internal conflict he was having about setting boundaries. He worries that he will then be rejected by his family. Clinician asked open ended questions and [name] identified how he could do so in a healthy way. [name] agreed to bring his homework back next session and continue to practice his techniques.    Plan: Return again in 1 -2 weeks.  Diagnosis:     Axis I: Bipolar 1 Disorder, Mixed, Moderate and Dissociative Amnesia, Grief, and PTSD   Jessicaann Overbaugh A, LCSW 08/30/2016

## 2016-08-31 ENCOUNTER — Encounter (HOSPITAL_COMMUNITY): Payer: Self-pay | Admitting: Clinical

## 2016-09-06 ENCOUNTER — Ambulatory Visit (INDEPENDENT_AMBULATORY_CARE_PROVIDER_SITE_OTHER): Payer: 59 | Admitting: Clinical

## 2016-09-06 DIAGNOSIS — F4320 Adjustment disorder, unspecified: Secondary | ICD-10-CM

## 2016-09-06 DIAGNOSIS — F440 Dissociative amnesia: Secondary | ICD-10-CM

## 2016-09-06 DIAGNOSIS — F319 Bipolar disorder, unspecified: Secondary | ICD-10-CM

## 2016-09-06 DIAGNOSIS — F4310 Post-traumatic stress disorder, unspecified: Secondary | ICD-10-CM

## 2016-09-06 DIAGNOSIS — F4321 Adjustment disorder with depressed mood: Secondary | ICD-10-CM

## 2016-09-06 NOTE — Progress Notes (Signed)
   THERAPIST PROGRESS NOTE  Session Time: 1:30 - 2:25  Participation Level: Active  Behavioral Response: CasualAlertNA and Depressed  Type of Therapy: Individual Therapy  Treatment Goals addressed: improve psychiatric symptoms, Controlled Behavior, Moderated Mood, Deliberative Speech (improved social functioning), Improve Unhelpful Thought Patterns, Emotional Regulation Skills, Feel and express a full Range of Emotions,  Healthy Coping Skills, Recall the Traumatic event without being overwhelmed    Interventions: CBT, Motivational Interviewing,  and psychoeducation  Summary: Justin [name] is a 71 y.o. male who presents with Bipolar 1 Disorder, Mixed, Moderate and Dissociative Amnesia, Grief, and PTSD  Suicidal/Homicidal: No - without intent/plan  Therapist Response:  Oryan met with clinician for an individual session. Denver discussed his psychiatric symptoms, his current life events and his homework. Danner shared that he had been feeling okay for the last 3 days though he had depression before and has been having difficulty sleeping. Clinician asked open ended questions and Sanjiv shared that he put into action what was discussed in last session and set a boundary with his family by telling them that he was unwilling to be in the middle of their squabbles with each other. He shared he was nervous to do so because he thought they might reject him but they responded "okay so far." Clinician asked open ended questions and Jaydis shared his insight from the experience.  This relieved a little of Sajan's anxiety. Willy had completed his homework from last session. Client and clinician reviewed and discussed the homework. Clinician introduced the topic of natural verses manufactured emotions. Clinician asked open ended questions and Randle identified personal examples of each.  Clinician introduced some CBT abc worksheets. Client and clinician reviewed how to complete the worksheets.  Brendyn had a tear when he used his own example to fill one out. However he was then able to formulate healthier alternative thoughts. He shared that he felt sad thinking about it but that it helped to see the thought he held so long was untrue. He agreed to complete several of the worksheets before next session    Plan: Return again in 1 -2 weeks.  Diagnosis:     Axis I: Bipolar 1 Disorder, Mixed, Moderate and Dissociative Amnesia, Grief, and PTSD    Symphony Demuro A, LCSW 09/06/2016

## 2016-09-07 ENCOUNTER — Encounter (HOSPITAL_COMMUNITY): Payer: Self-pay | Admitting: Clinical

## 2016-09-13 ENCOUNTER — Encounter (HOSPITAL_COMMUNITY): Payer: Self-pay | Admitting: Clinical

## 2016-09-13 ENCOUNTER — Ambulatory Visit (INDEPENDENT_AMBULATORY_CARE_PROVIDER_SITE_OTHER): Payer: 59 | Admitting: Clinical

## 2016-09-13 ENCOUNTER — Telehealth (HOSPITAL_COMMUNITY): Payer: Self-pay

## 2016-09-13 DIAGNOSIS — F319 Bipolar disorder, unspecified: Secondary | ICD-10-CM | POA: Diagnosis not present

## 2016-09-13 DIAGNOSIS — F4310 Post-traumatic stress disorder, unspecified: Secondary | ICD-10-CM

## 2016-09-13 DIAGNOSIS — F4320 Adjustment disorder, unspecified: Secondary | ICD-10-CM | POA: Diagnosis not present

## 2016-09-13 DIAGNOSIS — F440 Dissociative amnesia: Secondary | ICD-10-CM | POA: Diagnosis not present

## 2016-09-13 DIAGNOSIS — F4321 Adjustment disorder with depressed mood: Secondary | ICD-10-CM

## 2016-09-13 MED ORDER — OLANZAPINE 5 MG PO TABS: 5.0000 mg | ORAL_TABLET | Freq: Every day | ORAL | 0 refills | Status: DC

## 2016-09-13 NOTE — Progress Notes (Signed)
   THERAPIST PROGRESS NOTE  Session Time: 1:32 - 2:30   Participation Level: Active  Behavioral Response: CasualAlertAnxious and Depressed  Type of Therapy: Individual Therapy  Treatment Goals addressed: improve psychiatric symptoms, Controlled Behavior, Moderated Mood, Deliberative Speech (improved social functioning), Improve Unhelpful Thought Patterns, Emotional Regulation Skills (Moderate moods, stress management),  Healthy Coping Skills,    Interventions: CBT, Motivational Interviewing, grounding techniques and mindfulness techniques, and psychoeducation  Summary: Justin [name] is a 71 y.o. male who presents with Bipolar 1 Disorder, Mixed, Moderate and Dissociative Amnesia, Grief, and PTSD  Suicidal/Homicidal: No - without intent/plan  Therapist Response:  Justin [name] met with clinician for an individual session. Justin [name] discussed his psychiatric symptoms and  his current life events. Clinician asked open ended questions  And Justin [name] shared that he was really struggling. He shared he has been experiencing mania since Thursday. He shared that on impulse he traded in his car for a new car. He also has been receiving packages that he doesn't remember ordering. He shared that he had some suicidal ideation. He shared that he has been barely able to sleep more than an hour or two each night since Thursday. He stated that his trazodone has been ineffective at helping him sleep. Clinician had nurse come in and review his medication. Justin [name] and clinician discussed bipolar symptoms and  healthy coping skills. Justin [name] and clinician practiced some grounding and mindfulness techniques together. At the end of the session his psychiatrist came in and an adjustment to his medication was made. Justin [name] was told to go to the emergency room if his symptoms got worse. He agreed to call the office if his symptoms did not improve by Thursday.    Plan: Return again in 1 -2 weeks.  Diagnosis:     Axis I: Bipolar 1  Disorder, Mixed, Moderate and Dissociative Amnesia, Grief, and PTSD    Sundra Haddix A, LCSW 09/13/2016

## 2016-09-13 NOTE — Telephone Encounter (Signed)
Medication management - Met with pt in to see Audelia Acton, therapist today as he was presenting with increased racing thougths, impulsive behaviors (bought a new car without needing one, packages delivered to his home he did not remember ordering), and increased problems with sleep.  Only sleeping 1 - 2 hours a night even when he takes Trazodone 50 mg, then takes a second one after he sleeps maybe an hour and still does not sleep.  Patient reported some + suicidal ideations but fleeting with no plan or intent.  Arranged for Dr. Adele Schilder to see patient with therapist and this RN.  Patient agreed with plan to stop Trazodone and to start Zyprexa 5 mg, one at bedtime.  Agreed to send new order to his [facility] in [place].  Patient to call back if does not show some improvement by Friday 09/16/16 and states understanding plan.  Patient to keep appointment with Dr. Adele Schilder for 09/22/16.

## 2016-09-20 ENCOUNTER — Ambulatory Visit (INDEPENDENT_AMBULATORY_CARE_PROVIDER_SITE_OTHER): Payer: 59 | Admitting: Clinical

## 2016-09-20 ENCOUNTER — Encounter (HOSPITAL_COMMUNITY): Payer: Self-pay | Admitting: Clinical

## 2016-09-20 DIAGNOSIS — F4321 Adjustment disorder with depressed mood: Secondary | ICD-10-CM

## 2016-09-20 DIAGNOSIS — F440 Dissociative amnesia: Secondary | ICD-10-CM | POA: Diagnosis not present

## 2016-09-20 DIAGNOSIS — F319 Bipolar disorder, unspecified: Secondary | ICD-10-CM

## 2016-09-20 DIAGNOSIS — F4310 Post-traumatic stress disorder, unspecified: Secondary | ICD-10-CM | POA: Diagnosis not present

## 2016-09-20 DIAGNOSIS — F4320 Adjustment disorder, unspecified: Secondary | ICD-10-CM | POA: Diagnosis not present

## 2016-09-20 NOTE — Progress Notes (Signed)
   THERAPIST PROGRESS NOTE  Session Time: 1:33 - 2:25  Participation Level: Active  Behavioral Response: Casual and NeatAlertDepressed  Type of Therapy: Individual Therapy  Treatment Goals addressed: improve psychiatric symptoms, Controlled Behavior, Moderated Mood, Deliberative Speech (improved social functioning), Improve Unhelpful Thought Patterns, Emotional Regulation Skills (Moderate moods, stress management),  Learn about Diagnosis, Healthy Coping Skills,    Interventions: CBT, Motivational Interviewing, grounding techniques and mindfulness techniques, and psychoeducation  Summary: Justin [name] is a 71 y.o. male who presents with Bipolar 1 Disorder, Mixed, Moderate and Dissociative Amnesia, Grief, and PTSD  Suicidal/Homicidal: No - without intent/plan  Therapist Response:  Justin [name] met with clinician for an individual session. Justin [name] discussed his psychiatric symptoms, his current life events and his homework. Justin [name] shared that he was feeling some depression , but a whole lot better than he had been feeling at last session. He shared that the change in medication (zyprexa) helped him sleep and his mania is gone. Client and clinician discussed his diagnosis and symptoms. He shared that he continues to have difficulty with his relationships with his family members. Clinician asked open ended questions and Justin [name] shared about the difficulties he has been experiencing. Client and clinician discussed his stress level. Client and clinician discussed the use of healthy boundaries and relationships. Justin [name] shared his negative automatic thoughts about himself triggered by things others had said to him. Clinician asked open ended questions and Justin [name] identified the evidence for and against the negative thoughts. He was then able to formulate healthier alternative thoughts. Client and clinician discussed healthy coping skills to improve his mood and his focus  Plan: Return again in 1 -2  weeks.  Diagnosis:     Axis I: Bipolar 1 Disorder, Mixed, Moderate and Dissociative Amnesia, Grief, and PTSD    Briarrose Shor A, LCSW 09/20/2016

## 2016-09-22 ENCOUNTER — Ambulatory Visit (INDEPENDENT_AMBULATORY_CARE_PROVIDER_SITE_OTHER): Payer: 59 | Admitting: Psychiatry

## 2016-09-22 ENCOUNTER — Encounter (HOSPITAL_COMMUNITY): Payer: Self-pay | Admitting: Psychiatry

## 2016-09-22 VITALS — BP 128/78 | HR 59 | Ht 71.5 in | Wt 162.6 lb

## 2016-09-22 DIAGNOSIS — Z9889 Other specified postprocedural states: Secondary | ICD-10-CM | POA: Diagnosis not present

## 2016-09-22 DIAGNOSIS — F319 Bipolar disorder, unspecified: Secondary | ICD-10-CM

## 2016-09-22 DIAGNOSIS — Z8249 Family history of ischemic heart disease and other diseases of the circulatory system: Secondary | ICD-10-CM

## 2016-09-22 DIAGNOSIS — Z79899 Other long term (current) drug therapy: Secondary | ICD-10-CM

## 2016-09-22 DIAGNOSIS — Z888 Allergy status to other drugs, medicaments and biological substances status: Secondary | ICD-10-CM

## 2016-09-22 DIAGNOSIS — Z823 Family history of stroke: Secondary | ICD-10-CM

## 2016-09-22 DIAGNOSIS — Z8261 Family history of arthritis: Secondary | ICD-10-CM

## 2016-09-22 DIAGNOSIS — Z833 Family history of diabetes mellitus: Secondary | ICD-10-CM

## 2016-09-22 MED ORDER — OLANZAPINE 7.5 MG PO TABS: 7.5000 mg | ORAL_TABLET | Freq: Every day | ORAL | 2 refills | Status: DC

## 2016-09-22 MED ORDER — OLANZAPINE 5 MG PO TABS: 5.0000 mg | ORAL_TABLET | Freq: Every day | ORAL | 0 refills | Status: DC

## 2016-09-22 MED ORDER — LAMOTRIGINE 200 MG PO TABS: 200.0000 mg | ORAL_TABLET | Freq: Every day | ORAL | 2 refills | Status: DC

## 2016-09-22 MED ORDER — ALPRAZOLAM 0.5 MG PO TABS: 0.5000 mg | ORAL_TABLET | Freq: Two times a day (BID) | ORAL | 2 refills | Status: DC

## 2016-09-22 NOTE — Progress Notes (Signed)
BH MD/PA/NP OP Progress Note  09/22/2016 3:58 PM Justin [name]  MRN:  [id number]  Chief Complaint:  Subjective:  I am doing much better with new medication.  I'm sleeping 5-6 hours.  I do not have any suicidal thoughts.  HPI: Justin [name] came for his follow-up appointment.  Now he is taking Zyprexa 5 mg which was started a few days ago because he was feeling very depressed and unable to sleep.  He was also having fleeting and passive suicidal thoughts and feel manic-like symptoms.  He is feeling much better since Zyprexa started.  He is sleeping 5-6 hours.  He is no longer having any suicidal thoughts.  He is more calm and relaxed.  Though he is still miss his deceased wife and some time ruminates about his loss but denies any hopelessness or worthlessness.  He wants to continue counseling with Pilar Plate a.  He denies any irritability, anger, mania or any psychosis.  His flashbacks are less intense and less frequent.  He has no tremors shakes or any EPS.  He is trying to gain weight.  He denies drinking alcohol or using any illegal substances.  He lives by himself.    Visit Diagnosis:    ICD-9-CM ICD-10-CM   1. Bipolar I disorder (HCC) 296.7 F31.9 ALPRAZolam (XANAX) 0.5 MG tablet     lamoTRIgine (LAMICTAL) 200 MG tablet     OLANZapine (ZYPREXA) 7.5 MG tablet     DISCONTINUED: OLANZapine (ZYPREXA) 5 MG tablet    Past Psychiatric History: Reviewed. Patient has bipolar disorder with at least more than 15 psychiatric hospitalization.  Most of psychiatric hospitalization was due to mania, depression, suicidal attempt.  He has taken overdose on his psychiatric medication including Ambien, Vistaril and alcohol.  His last psychiatric hospitalization was in 2007 at [facility].  In the past he has taken Zyprexa, Cymbalta, Seroquel, Vistaril, Ambien, Neurontin , Wellbutrin and Trileptal however he do not remember the details very well.  He did remember lithium cause side effects.  He was seeing Dr.  Letta Moynahan and Parthenia Ames.  Patient has history of mania, psychosis, hallucination and severe.  He recently finished intensive outpatient program depression.    Past Medical History:  Past Medical History:  Diagnosis Date  . Anxiety   . Arthritis   . Asthma   . Bipolar disorder ([place])   . CAD S/P percutaneous coronary stenting   . CHF (congestive heart failure) ([place])   . Chicken pox   . Colon polyps   . Depression   . Dissociative amnesia   . Grief   . Hx of adenomatous colonic polyps 06/22/2016  . Hyperlipidemia   . Hypertension   . Migraines   . NSTEMI (non-ST elevated myocardial infarction) ([place])   . PTSD (post-traumatic stress disorder)     Past Surgical History:  Procedure Laterality Date  . CERVICAL DISCECTOMY    . COLONOSCOPY    . ELBOW SURGERY Left   . ELBOW SURGERY Right   . INGUINAL HERNIA REPAIR Bilateral    1996, 1997  . KNEE ARTHROSCOPY Right   . KNEE CARTILAGE SURGERY Left   . NASAL SEPTUM SURGERY    . STENT PLACEMENT VASCULAR ([facility])  09/02/2010  . TONSILLECTOMY      Family Psychiatric History: Reviewed.  Family History:  Family History  Problem Relation Age of Onset  . Arthritis Mother   . Heart disease Mother   . Hypertension Mother   . Alzheimer's disease Mother   .  Heart attack Mother   . Arthritis Father   . Hyperlipidemia Father   . Heart disease Father   . Stroke Father   . Hypertension Father   . Heart attack Father   . Multiple sclerosis Sister   . Diabetes Sister   . Lung cancer Paternal Grandmother   . Diabetes Sister   . Lung cancer Maternal Aunt   . Lung cancer Paternal Aunt     Social History:  Social History   Social History  . Marital status: Widowed    Spouse name: N/A  . Number of children: 2  . Years of education: 12   Occupational History  . Retired    Social History Main Topics  . Smoking status: Never Smoker  . Smokeless tobacco: Never Used  . Alcohol use No  . Drug use: No  . Sexual activity:  Not Asked   Other Topics Concern  . None   Social History Narrative   Retired, widowed in 2017    1 son one daughter   2 caffeinated beverages daily no alcohol or tobacco   Fun: Work out in the yard.   Denies religious beliefs effecting healthcare.     Allergies:  Allergies  Allergen Reactions  . Ambien [Zolpidem Tartrate] Other (See Comments)    Blackout, memory issues    Metabolic Disorder Labs: No results found for: HGBA1C, MPG No results found for: PROLACTIN Lab Results  Component Value Date   CHOL 141 03/26/2015   TRIG 87.0 03/26/2015   HDL 68.50 03/26/2015   CHOLHDL 2 03/26/2015   VLDL 17.4 03/26/2015   LDLCALC 55 03/26/2015     Current Medications: Current Outpatient Prescriptions  Medication Sig Dispense Refill  . acetaminophen-codeine (TYLENOL #3) 300-30 MG tablet Take 1 tablet by mouth every 8 (eight) hours as needed for moderate pain. 60 tablet 0  . ALPRAZolam (XANAX) 0.5 MG tablet Take 1 tablet (0.5 mg total) by mouth 2 (two) times daily. 60 tablet 2  . CARTIA XT 180 MG 24 hr capsule     . clopidogrel (PLAVIX) 75 MG tablet Take 75 mg by mouth daily.    Marland Kitchen lamoTRIgine (LAMICTAL) 200 MG tablet Take 1 tablet (200 mg total) by mouth daily. 30 tablet 2  . nitroGLYCERIN (NITROSTAT) 0.4 MG SL tablet     . OLANZapine (ZYPREXA) 5 MG tablet Take 1 tablet (5 mg total) by mouth at bedtime. 30 tablet 0  . rosuvastatin (CRESTOR) 20 MG tablet Take 20 mg by mouth daily.     Current Facility-Administered Medications  Medication Dose Route Frequency Provider Last Rate Last Dose  . 0.9 %  sodium chloride infusion  500 mL Intravenous Continuous Gatha Mayer, MD        Neurologic: Headache: No Seizure: No Paresthesias: No  Musculoskeletal: Strength & Muscle Tone: within normal limits Gait & Station: normal Patient leans: N/A  Psychiatric Specialty Exam: Review of Systems  Constitutional: Negative.   Skin: Negative for itching and rash.  Neurological: Negative.    Psychiatric/Behavioral: The patient is nervous/anxious.     Blood pressure 128/78, pulse (!) 59, height 5' 11.5" (1.816 m), weight 162 lb 9.6 oz (73.8 kg).Body mass index is 22.36 kg/m.  General Appearance: Casual  Eye Contact:  Good  Speech:  Slow  Volume:  Normal  Mood:  Dysphoric  Affect:  Congruent  Thought Process:  Goal Directed  Orientation:  Full (Time, Place, and Person)  Thought Content: WDL, Logical and Rumination   Suicidal Thoughts:  No  Homicidal Thoughts:  No  Memory:  Immediate;   Good Recent;   Good Remote;   Good  Judgement:  Good  Insight:  Good  Psychomotor Activity:  Normal  Concentration:  Concentration: Fair and Attention Span: Fair  Recall:  [employer] of Knowledge: Good  Language: Good  Akathisia:  No  Handed:  Right  AIMS (if indicated):  0  Assets:  Communication Skills Desire for Improvement Housing Physical Health  ADL's:  Intact  Cognition: WNL  Sleep:  fair   Assessment: Bipolar disorder mixed.  Posttraumatic stress disorder.  Major depressive disorder, recurrent.  Plan: Patient doing better on Zyprexa.  He is still have racing thoughts and rumination but denies any suicidal thoughts.  I recommended to increase Zyprexa 7.5 mg at bedtime.  Continue Xanax 0.5 mg twice a day and Lamictal 200 mg daily.  Patient does not have any tremors, shakes, rash or any itching.  Encouraged to continue counseling with Pilar Plate a.  Recommended to call us back if he is any question, concern if he feel worsening of the symptom.  Follow-up in 3 months.  Discuss safety plan that anytime having active suicidal thoughts or homicidal thoughts and he need to call 911 or go to the local emergency room.   Octavia Mottola T., MD 09/22/2016, 3:58 PM

## 2016-10-03 ENCOUNTER — Ambulatory Visit (INDEPENDENT_AMBULATORY_CARE_PROVIDER_SITE_OTHER): Payer: 59 | Admitting: Clinical

## 2016-10-03 DIAGNOSIS — F4321 Adjustment disorder with depressed mood: Secondary | ICD-10-CM

## 2016-10-03 DIAGNOSIS — F4310 Post-traumatic stress disorder, unspecified: Secondary | ICD-10-CM

## 2016-10-03 DIAGNOSIS — F4320 Adjustment disorder, unspecified: Secondary | ICD-10-CM

## 2016-10-03 DIAGNOSIS — F319 Bipolar disorder, unspecified: Secondary | ICD-10-CM | POA: Diagnosis not present

## 2016-10-03 DIAGNOSIS — F440 Dissociative amnesia: Secondary | ICD-10-CM | POA: Diagnosis not present

## 2016-10-03 NOTE — Progress Notes (Signed)
   THERAPIST PROGRESS NOTE  Session Time: 1:30 - 2:25  Participation Level: Active  Behavioral Response: CasualAlertAnxious and Depressed  Type of Therapy: Individual Therapy  Treatment Goals addressed: improve psychiatric symptoms, , Improve Unhelpful Thought Patterns, Emotional Regulation Skills (stress management), Feel and express a full Range of Emotions, , Healthy Coping Skills,    Interventions: Motivational Interviewing, grounding techniques and mindfulness techniques,   Summary: Arvel Oquinn is a 71 y.o. male who presents with Bipolar 1 Disorder, Mixed, Moderate and Dissociative Amnesia, Grief, and PTSD  Suicidal/Homicidal: No - without intent/plan  Therapist Response:  Tavarious met with clinician for an individual session. Brayten discussed his psychiatric symptoms, his current life events and his homework. Leslie shared that he had done his homework but wasn't very focus. He shared that he had a very difficult time this past week. Will clinician asked open ended questions. Loel shared that he was feeling both depressed and manic. He shared that his Zyprexa dose was increased and he feels like it's too much that he is sleeping too much and groggy all the day. Clinician encouraged him to inform the nurse and or his doctor. (clinician followed through with informing the nurse. Amous shared that he was feeling a bit manic and agitated. He shared he had dreamed of his wife and his brother , both deceased. He was tearful as he described missing them. He shared he called the suicide hotline to speak to someone. He was not suicidal but overwhelmed. When he told them this he felt like they brushed him off which deepened his feeling of isolation. He denied any current suicidal or homicidal ideation. Client and clinician discussed setting and maintaining healthy boundaries with family as well as getting out ( [facility]) to somewhere he could meet new people and engage in conversation.  Client and clinician practiced grounding techniques together. Prayan reported he felt a little bit better and would call if things did not improve. He said he is also aware he can go to the emergency room. Client and clinician reviewed some stress management techniques.   Plan: Return again in 1 -2 weeks.  Diagnosis:     Axis I: Bipolar 1 Disorder, Mixed, Moderate and Dissociative Amnesia, Grief, and PTSD    Dagmawi Venable A, LCSW 10/03/2016

## 2016-10-04 ENCOUNTER — Telehealth (HOSPITAL_COMMUNITY): Payer: Self-pay

## 2016-10-04 ENCOUNTER — Encounter (HOSPITAL_COMMUNITY): Payer: Self-pay | Admitting: Clinical

## 2016-10-04 NOTE — Telephone Encounter (Signed)
Patient had an increase in his Zyprexa at his last visit, since then patient has been unable to sleep well and he is always tired. Patient states that he had a dream the other night that his dead wife and brother were in the living room talking - patient is not sure if this was a hallucination or a dream. Patient is very tearful and feeling depressed. Please review and advise, thank you

## 2016-10-04 NOTE — Telephone Encounter (Signed)
I called and spoke to patient, I told him that Dr. Adele Schilder would like him to go back to the 5 mg of Zyprexa and see if his symptoms go away. I told patient to call me in a week and let me know how he is doing.

## 2016-10-06 ENCOUNTER — Ambulatory Visit: Payer: Self-pay | Admitting: Family

## 2016-10-10 ENCOUNTER — Ambulatory Visit (INDEPENDENT_AMBULATORY_CARE_PROVIDER_SITE_OTHER): Payer: Medicare Other | Admitting: Family

## 2016-10-10 ENCOUNTER — Encounter: Payer: Self-pay | Admitting: Family

## 2016-10-10 ENCOUNTER — Ambulatory Visit (INDEPENDENT_AMBULATORY_CARE_PROVIDER_SITE_OTHER): Payer: 59 | Admitting: Clinical

## 2016-10-10 VITALS — BP 122/72 | HR 74 | Temp 98.2°F | Ht 70.5 in | Wt 166.0 lb

## 2016-10-10 DIAGNOSIS — G8929 Other chronic pain: Secondary | ICD-10-CM

## 2016-10-10 DIAGNOSIS — F440 Dissociative amnesia: Secondary | ICD-10-CM

## 2016-10-10 DIAGNOSIS — M545 Low back pain, unspecified: Secondary | ICD-10-CM

## 2016-10-10 DIAGNOSIS — F4320 Adjustment disorder, unspecified: Secondary | ICD-10-CM

## 2016-10-10 DIAGNOSIS — F319 Bipolar disorder, unspecified: Secondary | ICD-10-CM

## 2016-10-10 DIAGNOSIS — F4321 Adjustment disorder with depressed mood: Secondary | ICD-10-CM

## 2016-10-10 DIAGNOSIS — F4310 Post-traumatic stress disorder, unspecified: Secondary | ICD-10-CM

## 2016-10-10 MED ORDER — TIZANIDINE HCL 4 MG PO TABS: 4.0000 mg | ORAL_TABLET | Freq: Four times a day (QID) | ORAL | 0 refills | Status: DC | PRN

## 2016-10-10 NOTE — Progress Notes (Signed)
Subjective:    Patient ID: Justin [name], male    DOB: 1946/06/18, 71 y.o.   MRN: [id number]  Chief Complaint  Patient presents with  . Back Pain    Pt stated lower left back been painful for about 1 week.    HPI:  Justin [name] is a 71 y.o. male who  has a past medical history of Anxiety; Arthritis; Asthma; Bipolar disorder ([place]); CAD S/P percutaneous coronary stenting; CHF (congestive heart failure) ([place]); Chicken pox; Colon polyps; Depression; Dissociative amnesia; Grief; adenomatous colonic polyps (06/22/2016); Hyperlipidemia; Hypertension; Migraines; NSTEMI (non-ST elevated myocardial infarction) ([place]); and PTSD (post-traumatic stress disorder). and presents today for an office visit.  Associated symptom of pain located in his lower back has been gong on for about 2 years with waxing and waning. Pain is described as achy at times. Aggravated with squating and picking up heavy objects. Occasional radiculopathy in the left leg which is currently resolved. Modifying factors include a heat pack which did help with his symptoms. Previous cortisone injections about 8 months ago which have helped with his symptoms for a brief period of time. Denies fevers, chills or loss of weight. No new trauma or injury that he recalls.   Allergies  Allergen Reactions  . Ambien [Zolpidem Tartrate] Other (See Comments)    Blackout, memory issues      Outpatient Medications Prior to Visit  Medication Sig Dispense Refill  . ALPRAZolam (XANAX) 0.5 MG tablet Take 1 tablet (0.5 mg total) by mouth 2 (two) times daily. 60 tablet 2  . CARTIA XT 180 MG 24 hr capsule     . clopidogrel (PLAVIX) 75 MG tablet Take 75 mg by mouth daily.    Marland Kitchen lamoTRIgine (LAMICTAL) 200 MG tablet Take 1 tablet (200 mg total) by mouth daily. 30 tablet 2  . nitroGLYCERIN (NITROSTAT) 0.4 MG SL tablet     . OLANZapine (ZYPREXA) 7.5 MG tablet Take 1 tablet (7.5 mg total) by mouth at bedtime. 30 tablet 2  . rosuvastatin (CRESTOR) 20 MG  tablet Take 20 mg by mouth daily.     Facility-Administered Medications Prior to Visit  Medication Dose Route Frequency Provider Last Rate Last Dose  . 0.9 %  sodium chloride infusion  500 mL Intravenous Continuous Gatha Mayer, MD         Review of Systems  Constitutional: Negative for chills, fatigue, fever and unexpected weight change.  Musculoskeletal: Positive for back pain.  Neurological: Negative for weakness and numbness.      Objective:    BP 122/72   Pulse 74   Temp 98.2 F (36.8 C)   Ht 5' 10.5" (1.791 m)   Wt 166 lb (75.3 kg)   SpO2 98%   BMI 23.48 kg/m  Nursing note and vital signs reviewed.  Physical Exam  Constitutional: He is oriented to person, place, and time. He appears well-developed and well-nourished. No distress.  Cardiovascular: Normal rate, regular rhythm, normal heart sounds and intact distal pulses.   Pulmonary/Chest: Effort normal and breath sounds normal.  Musculoskeletal:  Low back - no obvious deformity, discoloration, or edema. There is palpable tenderness along the left lumbar paraspinal musculature and sacroiliac joint. No crepitus or deformity. Range of motion decreased in flexion with discomfort noted in lateral bending. Distal pulses and sensation are intact and appropriate. Standing flexion test positive on the right side. Iliac crest more superior on the right. Straight leg raise negative. Faber's test negative.  Neurological: He is alert and  oriented to person, place, and time.  Skin: Skin is warm and dry.  Psychiatric: He has a normal mood and affect. His behavior is normal. Judgment and thought content normal.       Assessment & Plan:   Problem List Items Addressed This Visit      Other   Low back pain - Primary    Continues to experience chronic low back pain most likely exacerbation with no significant trauma or injury. Start tizanidine. Continue conservative treatment with ice/moist heat and home exercise therapy. Scheduled  to undergo cortisone injection within the next 2 weeks. Continue to monitor.      Relevant Medications   traMADol (ULTRAM) 50 MG tablet   tiZANidine (ZANAFLEX) 4 MG tablet       I am having Mr. Lamorte start on tiZANidine. I am also having him maintain his rosuvastatin, clopidogrel, CARTIA XT, nitroGLYCERIN, ALPRAZolam, lamoTRIgine, OLANZapine, and traMADol. We will continue to administer sodium chloride.   Meds ordered this encounter  Medications  . traMADol (ULTRAM) 50 MG tablet  . tiZANidine (ZANAFLEX) 4 MG tablet    Sig: Take 1 tablet (4 mg total) by mouth every 6 (six) hours as needed for muscle spasms.    Dispense:  60 tablet    Refill:  0    Order Specific Question:   Supervising Provider    Answer:   Pricilla Holm A [id number]     Follow-up: Return if symptoms worsen or fail to improve.  Justin [name], Justin [name]

## 2016-10-10 NOTE — Patient Instructions (Signed)
Thank you for choosing [facility].  SUMMARY AND INSTRUCTIONS:  Medication:  Your prescription(s) have been submitted to your pharmacy or been printed and provided for you. Please take as directed and contact our office if you believe you are having problem(s) with the medication(s) or have any questions.  Follow up:  If your symptoms worsen or fail to improve, please contact our office for further instruction, or in case of emergency go directly to the emergency room at the closest medical facility.     Low Back Sprain Rehab Ask your health care provider which exercises are safe for you. Do exercises exactly as told by your health care provider and adjust them as directed. It is normal to feel mild stretching, pulling, tightness, or discomfort as you do these exercises, but you should stop right away if you feel sudden pain or your pain gets worse. Do not begin these exercises until told by your health care provider. Stretching and range of motion exercises These exercises warm up your muscles and joints and improve the movement and flexibility of your back. These exercises also help to relieve pain, numbness, and tingling. Exercise A: Lumbar rotation 1. Lie on your back on a firm surface and bend your knees. 2. Straighten your arms out to your sides so each arm forms an "L" shape with a side of your body (a 90 degree angle). 3. Slowly move both of your knees to one side of your body until you feel a stretch in your lower back. Try not to let your shoulders move off of the floor. 4. Hold for __________ seconds. 5. Tense your abdominal muscles and slowly move your knees back to the starting position. 6. Repeat this exercise on the other side of your body. Repeat __________ times. Complete this exercise __________ times a day. Exercise B: Prone extension on elbows 1. Lie on your abdomen on a firm surface. 2. Prop yourself up on your elbows. 3. Use your arms to help lift your chest  up until you feel a gentle stretch in your abdomen and your lower back.  This will place some of your body weight on your elbows. If this is uncomfortable, try stacking pillows under your chest.  Your hips should stay down, against the surface that you are lying on. Keep your hip and back muscles relaxed. 4. Hold for __________ seconds. 5. Slowly relax your upper body and return to the starting position. Repeat __________ times. Complete this exercise __________ times a day. Strengthening exercises These exercises build strength and endurance in your back. Endurance is the ability to use your muscles for a long time, even after they get tired. Exercise C: Pelvic tilt 1. Lie on your back on a firm surface. Bend your knees and keep your feet flat. 2. Tense your abdominal muscles. Tip your pelvis up toward the ceiling and flatten your lower back into the floor.  To help with this exercise, you may place a small towel under your lower back and try to push your back into the towel. 3. Hold for __________ seconds. 4. Let your muscles relax completely before you repeat this exercise. Repeat __________ times. Complete this exercise __________ times a day. Exercise D: Alternating arm and leg raises 1. Get on your hands and knees on a firm surface. If you are on a hard floor, you may want to use padding to cushion your knees, such as an exercise mat. 2. Line up your arms and legs. Your hands should be below  your shoulders, and your knees should be below your hips. 3. Lift your left leg behind you. At the same time, raise your right arm and straighten it in front of you.  Do not lift your leg higher than your hip.  Do not lift your arm higher than your shoulder.  Keep your abdominal and back muscles tight.  Keep your hips facing the ground.  Do not arch your back.  Keep your balance carefully, and do not hold your breath. 4. Hold for __________ seconds. 5. Slowly return to the starting  position and repeat with your right leg and your left arm. Repeat __________ times. Complete this exercise __________ times a day. Exercise E: Abdominal set with straight leg raise 1. Lie on your back on a firm surface. 2. Bend one of your knees and keep your other leg straight. 3. Tense your abdominal muscles and lift your straight leg up, 4-6 inches (10-15 cm) off the ground. 4. Keep your abdominal muscles tight and hold for __________ seconds.  Do not hold your breath.  Do not arch your back. Keep it flat against the ground. 5. Keep your abdominal muscles tense as you slowly lower your leg back to the starting position. 6. Repeat with your other leg. Repeat __________ times. Complete this exercise __________ times a day. Posture and body mechanics   Body mechanics refers to the movements and positions of your body while you do your daily activities. Posture is part of body mechanics. Good posture and healthy body mechanics can help to relieve stress in your body's tissues and joints. Good posture means that your spine is in its natural S-curve position (your spine is neutral), your shoulders are pulled back slightly, and your head is not tipped forward. The following are general guidelines for applying improved posture and body mechanics to your everyday activities. Standing   When standing, keep your spine neutral and your feet about hip-width apart. Keep a slight bend in your knees. Your ears, shoulders, and hips should line up.  When you do a task in which you stand in one place for a long time, place one foot up on a stable object that is 2-4 inches (5-10 cm) high, such as a footstool. This helps keep your spine neutral. Sitting  When sitting, keep your spine neutral and keep your feet flat on the floor. Use a footrest, if necessary, and keep your thighs parallel to the floor. Avoid rounding your shoulders, and avoid tilting your head forward.  When working at a desk or a computer,  keep your desk at a height where your hands are slightly lower than your elbows. Slide your chair under your desk so you are close enough to maintain good posture.  When working at a computer, place your monitor at a height where you are looking straight ahead and you do not have to tilt your head forward or downward to look at the screen. Resting   When lying down and resting, avoid positions that are most painful for you.  If you have pain with activities such as sitting, bending, stooping, or squatting (flexion-based activities), lie in a position in which your body does not bend very much. For example, avoid curling up on your side with your arms and knees near your chest (fetal position).  If you have pain with activities such as standing for a long time or reaching with your arms (extension-based activities), lie with your spine in a neutral position and bend your knees slightly.  Try the following positions:  Lying on your side with a pillow between your knees.  Lying on your back with a pillow under your knees. Lifting   When lifting objects, keep your feet at least shoulder-width apart and tighten your abdominal muscles.  Bend your knees and hips and keep your spine neutral. It is important to lift using the strength of your legs, not your back. Do not lock your knees straight out.  Always ask for help to lift heavy or awkward objects. This information is not intended to replace advice given to you by your health care provider. Make sure you discuss any questions you have with your health care provider. Document Released: 08/08/2005 Document Revised: 04/14/2016 Document Reviewed: 05/20/2015 Elsevier Interactive Patient Education  2017 [employer].

## 2016-10-10 NOTE — Progress Notes (Signed)
   THERAPIST PROGRESS NOTE  Session Time: 12:02 - 12:58   Participation Level: Active  Behavioral Response: CasualAlertDepressed   Type of Therapy: Individual Therapy  Treatment Goals addressed: improve psychiatric symptoms, Controlled Behavior, Moderated Mood, Deliberative Speech (improved social functioning), Improve Unhelpful Thought Patterns, Emotional Regulation Skills (Moderate moods, anger management, stress management), Feel and  Learn about Diagnosis, Healthy Coping Skills,    Interventions: CBT, Motivational Interviewing, grounding techniques techniques, and psychoeducation  Summary: Justin [name] is a 71 y.o. male who presents with Bipolar 1 Disorder, Mixed, Moderate and Dissociative Amnesia, Grief, and PTSD  Suicidal/Homicidal: No - without intent/plan  Therapist Response:  Koven met with clinician for an individual session. [name] discussed his psychiatric symptoms and his current life events. [name] shared that he has had some good days but mostly had difficult days with his emotions. He shared he had some mania but mostly depression. He shared that he decided to set boundaries with his family, but that it came after a lot of interaction. He shared that another family member had approached him about what happened at the hospital. He shared all his family members have conflicting stories which is very stressful for him since he himself cannot remember. Client and clinician discussed his amnesia. Clinician asked open ended questions and [name] shared his thoughts about the experience. He shared some negative automatic thoughts about himself. Client and clinician discussed the body and minds response to stress and grief. Clinician asked what he would say to someone else. [name] said more compassionate things when he thought of another person. Clinician asked open ended questions and [name] identified the benefits of being kinder to himself. Client and clinician practice a  grounding technique together. Client and clinician discussed healthy boundaries and how maintaining them can help decrease stress and anger.   Plan: Return again in 1 -2 weeks.  Diagnosis:     Axis I: Bipolar 1 Disorder, Mixed, Moderate and Dissociative Amnesia, Grief, and PTSD   Kayron Kalmar A, LCSW 10/10/2016

## 2016-10-10 NOTE — Assessment & Plan Note (Signed)
Continues to experience chronic low back pain most likely exacerbation with no significant trauma or injury. Start tizanidine. Continue conservative treatment with ice/moist heat and home exercise therapy. Scheduled to undergo cortisone injection within the next 2 weeks. Continue to monitor.

## 2016-10-13 ENCOUNTER — Ambulatory Visit (HOSPITAL_COMMUNITY): Payer: Self-pay | Admitting: Clinical

## 2016-10-18 ENCOUNTER — Encounter (HOSPITAL_COMMUNITY): Payer: Self-pay | Admitting: Clinical

## 2016-10-19 ENCOUNTER — Encounter (HOSPITAL_COMMUNITY): Payer: Self-pay | Admitting: Clinical

## 2016-10-19 ENCOUNTER — Ambulatory Visit (INDEPENDENT_AMBULATORY_CARE_PROVIDER_SITE_OTHER): Payer: 59 | Admitting: Clinical

## 2016-10-19 DIAGNOSIS — F4321 Adjustment disorder with depressed mood: Secondary | ICD-10-CM

## 2016-10-19 DIAGNOSIS — F440 Dissociative amnesia: Secondary | ICD-10-CM | POA: Diagnosis not present

## 2016-10-19 DIAGNOSIS — F319 Bipolar disorder, unspecified: Secondary | ICD-10-CM | POA: Diagnosis not present

## 2016-10-19 DIAGNOSIS — F4320 Adjustment disorder, unspecified: Secondary | ICD-10-CM

## 2016-10-19 DIAGNOSIS — F4310 Post-traumatic stress disorder, unspecified: Secondary | ICD-10-CM | POA: Diagnosis not present

## 2016-10-19 NOTE — Progress Notes (Signed)
   THERAPIST PROGRESS NOTE  Session Time: 2:30 - 3:25  Participation Level: Active  Behavioral Response: CasualAlertDepressed   Type of Therapy: Individual Therapy  Treatment Goals addressed: improve psychiatric symptoms,  Improve Unhelpful Thought Patterns, Emotional Regulation Skills (Moderate moods, anger management, stress management), Feel and express a full Range of Emotions,  Healthy Coping Skills,    Interventions: CBT, Motivational Interviewing,  psychoeducation  Summary: Cotey Rakes is a 71 y.o. male who presents with Bipolar 1 Disorder, Mixed, Moderate and Dissociative Amnesia, Grief, and PTSD  Suicidal/Homicidal: No - without intent/plan  Therapist Response:  Ambers met with clinician for an individual session. Lamount discussed his psychiatric symptoms and his current life events. Macsen shared he had some good days and some very depressed days since last session. Clinician asked open ended questions. He shared that he has been maintaining his boundaries which has helped some. He shared that he has been missing his wife a lot and had discovered that his son had taking the life insuurance policy that was left to [place] and had spent it. Leticia shared that he was hurt to discover this but had decided to move on and not pursue any action. Montez shared that he had some passive suicidal thoughts but that he was able to use his techniques to change his thoughts. Darrious shared some recurrent negative thoughts he has been experiencing. Clinician asked open ended questions and Toron identified the evidence for and against the thoughts. He was then able to formulate healthier alternative thoughts. He had completed a packet on Self Compassion to help improve his emotions and thoughts.  Akaash and clinician discussed how his negative thoughts about himself were increasing his depression and how to continue to challenge them. He agreed to continue to challenge them and to continue to  practice his grounding and mindfulness techniques. Client and clinician discussed him going to the senior center or other places where he could make some friends and have positive social interaction.   Plan: Return again in 1 -2 weeks.  Diagnosis:     Axis I: Bipolar 1 Disorder, Mixed, Moderate and Dissociative Amnesia, Grief, and PTSD   Jemarion Roycroft A, LCSW 10/19/2016

## 2016-10-24 ENCOUNTER — Other Ambulatory Visit (HOSPITAL_COMMUNITY): Payer: Self-pay

## 2016-10-31 ENCOUNTER — Ambulatory Visit (HOSPITAL_COMMUNITY): Payer: Self-pay | Admitting: Clinical

## 2016-11-09 ENCOUNTER — Ambulatory Visit (INDEPENDENT_AMBULATORY_CARE_PROVIDER_SITE_OTHER): Payer: Medicare Other | Admitting: Clinical

## 2016-11-09 DIAGNOSIS — F4320 Adjustment disorder, unspecified: Secondary | ICD-10-CM | POA: Diagnosis not present

## 2016-11-09 DIAGNOSIS — F440 Dissociative amnesia: Secondary | ICD-10-CM

## 2016-11-09 DIAGNOSIS — F4321 Adjustment disorder with depressed mood: Secondary | ICD-10-CM

## 2016-11-09 DIAGNOSIS — F4310 Post-traumatic stress disorder, unspecified: Secondary | ICD-10-CM

## 2016-11-09 DIAGNOSIS — F319 Bipolar disorder, unspecified: Secondary | ICD-10-CM

## 2016-11-09 NOTE — Progress Notes (Signed)
   THERAPIST PROGRESS NOTE  Session Time: 11:05 - 12:00  Participation Level: Active  Behavioral Response: CasualAlertDepressed  Type of Therapy: Individual Therapy  Treatment Goals addressed: improve psychiatric symptoms, Controlled Behavior, Moderated Mood, Deliberative Speech (improved social functioning), Improve Unhelpful Thought Patterns,  Healthy Coping Skills,    Interventions: CBT, Motivational Interviewing,  psychoeducation  Summary: Justin [name] is a 72 y.o. male who presents with Bipolar 1 Disorder, Mixed, Moderate and Dissociative Amnesia, Grief, and PTSD  Suicidal/Homicidal: No - without intent/plan  Therapist Response:  [name] met with clinician for an individual session. [name] discussed his psychiatric symptoms and his current life events. [name] shared that he has been real tired lately. Clinician asked open ended questions. [name] stated that his sleep has been inconsistent. He stated that he experienced some mild mania.  He shared that the Monday was the anniversary of his wife's death. He shared that it was both difficult and easier than he thought. Clinician asked open ended questions and he shared about thinking about the positive things they had experienced together. He also shared that others had called to check on him which was much different than his experience when she had died. Clinician asked open ended questions and [name] shared that he has had some acceptance that he might not be able to ever recall what went on with him on that day and that he has decided not to ask anymore. He shared that he is ready to start making friends and has decided to join the [employer]. [name] shared that he has a long held belief that he is undesirable as a friend. Clinician asked open ended questions. [name] identified some of his negative beliefs about himself. He identified how he responses when he has these beliefs. Client and clinician discussed the evidence for and against the  belief. Client and clinician discussed how his actions reflect his beliefs and gives him the feedback he expects. Client and clinician discussed healthy alternative belief and how his actions might change.    Plan: Return again in 1 -2 weeks.  Diagnosis:     Axis I: Bipolar 1 Disorder, Mixed, Moderate and Dissociative Amnesia, Grief, and PTSD    Quincy Boy A, LCSW 11/09/2016

## 2016-11-11 ENCOUNTER — Encounter (HOSPITAL_COMMUNITY): Payer: Self-pay | Admitting: Clinical

## 2016-11-16 ENCOUNTER — Encounter (HOSPITAL_COMMUNITY): Payer: Self-pay | Admitting: Clinical

## 2016-11-16 ENCOUNTER — Ambulatory Visit (INDEPENDENT_AMBULATORY_CARE_PROVIDER_SITE_OTHER): Payer: Medicare Other | Admitting: Clinical

## 2016-11-16 DIAGNOSIS — F440 Dissociative amnesia: Secondary | ICD-10-CM | POA: Diagnosis not present

## 2016-11-16 DIAGNOSIS — F4321 Adjustment disorder with depressed mood: Secondary | ICD-10-CM

## 2016-11-16 DIAGNOSIS — F4320 Adjustment disorder, unspecified: Secondary | ICD-10-CM | POA: Diagnosis not present

## 2016-11-16 DIAGNOSIS — F4310 Post-traumatic stress disorder, unspecified: Secondary | ICD-10-CM

## 2016-11-16 DIAGNOSIS — F319 Bipolar disorder, unspecified: Secondary | ICD-10-CM

## 2016-11-16 NOTE — Progress Notes (Signed)
   THERAPIST PROGRESS NOTE  Session Time: 1:33 -2:25  Participation Level: Active  Behavioral Response: CasualAlertDepressed  Type of Therapy: Individual Therapy  Treatment Goals addressed: improve psychiatric symptoms, Improve Unhelpful Thought Patterns, Emotional Regulation Skills Healthy Coping Skills,    Interventions: CBT, Motivational Interviewing,   Summary: Justin [name] is a 71 y.o. male who presents with Bipolar 1 Disorder, Mixed, Moderate and Dissociative Amnesia, Grief, and PTSD  Suicidal/Homicidal: No - without intent/plan  Therapist Response:  Justin [name] met with clinician for an individual session. Justin [name] discussed his psychiatric symptoms, his current life events and his homework. He shared that he has been experiencing inconsistent sleep. He stated that he was experiencing mania Wed threw Friday He stated he has been very busy. He stated he crashed on Saturday and  been experiencing depression about his wife's death and the current family drauma  He had completed a packet on Self Compassion to help improve his emotions and his thoughts. He shared his thoughts and insights from the homework packet. He recognized that he is kinder to others and very self critical. Clinician asked open ended questions and Justin [name] shared  theways he is self critical stions and Justin [name] shared his thoughts about how and why he is self critical and how it negatively affects his mood. Justin [name] and cliniciian discussed the evidence for and against his negative thoughts about himself. Client and clinician discussed how his emotions and actions might change if he were less critical about himself.     Plan: Return again in 1 -2 weeks.  Diagnosis:     Axis I: Bipolar 1 Disorder, Mixed, Moderate and Dissociative Amnesia, Grief, and PTSD   Justin [name] A, LCSW 11/16/2016

## 2016-11-21 ENCOUNTER — Ambulatory Visit (INDEPENDENT_AMBULATORY_CARE_PROVIDER_SITE_OTHER): Payer: Medicare Other | Admitting: Clinical

## 2016-11-21 ENCOUNTER — Encounter (HOSPITAL_COMMUNITY): Payer: Self-pay | Admitting: Clinical

## 2016-11-21 DIAGNOSIS — F4320 Adjustment disorder, unspecified: Secondary | ICD-10-CM

## 2016-11-21 DIAGNOSIS — F319 Bipolar disorder, unspecified: Secondary | ICD-10-CM | POA: Diagnosis not present

## 2016-11-21 DIAGNOSIS — F4321 Adjustment disorder with depressed mood: Secondary | ICD-10-CM

## 2016-11-21 DIAGNOSIS — F4310 Post-traumatic stress disorder, unspecified: Secondary | ICD-10-CM | POA: Diagnosis not present

## 2016-11-21 DIAGNOSIS — F440 Dissociative amnesia: Secondary | ICD-10-CM

## 2016-11-21 NOTE — Progress Notes (Signed)
   THERAPIST PROGRESS NOTE  Session Time: 3:30 - 4:25  Participation Level: Active  Behavioral Response: CasualAlertAnxious  Type of Therapy: Individual Therapy  Treatment Goals addressed: improve psychiatric symptoms,   Improve Unhelpful Thought Patterns,   Learn about Diagnosis, Healthy Coping Skills,     Interventions: CBT, Motivational Interviewing,and psychoeducation  Summary: Jodie Leiner is a 71 y.o. male who presents with Bipolar 1 Disorder, Mixed, Moderate and Dissociative Amnesia, Grief, and PTSD  Suicidal/Homicidal: No - without intent/plan  Therapist Response:  Harbor met with clinician for an individual session. Matty discussed his psychiatric symptoms, his current life events. Zhion was talking fast and when clinician asked he admitted he was both feeling manic and exhausted. Clinician asked open ended questions and Jandiel shared about the grief he has been experiencing about the loss of his wife as well as the feelings of disconnection from his family. He shared that he as been working to be more social and went to his sisters for [employer]. He shared that it started of with him feeling very anxious but got better as the evening moved on. Clinician asked open ended questions and Garnie recognized that it was his own beliefs about himself that increased his anxiety. He struggles with his thoughts about the stigma of being bipolar. Client and clinician discussed his diagnosis and how many suffer from it. Clinician suggested he view Bring [employer] mind.com  Client and clinician discussed projection versus reality. Client and clinician discussed self compassion, understanding, and acceptance of self. Client and clinician discussed how his emotions and behaviors might change if he viewed bipolar differently. Client and clinician agreed to discuss this further at future sessions.    Plan: Return again in 1 -2 weeks.  Diagnosis:     Axis I: Bipolar 1 Disorder, Mixed,  Moderate and Dissociative Amnesia, Grief, and PTSD  Kenly Henckel A, LCSW 11/21/2016

## 2016-11-28 ENCOUNTER — Telehealth (HOSPITAL_COMMUNITY): Payer: Self-pay

## 2016-11-28 ENCOUNTER — Encounter (HOSPITAL_COMMUNITY): Payer: Self-pay | Admitting: Clinical

## 2016-11-28 ENCOUNTER — Ambulatory Visit (INDEPENDENT_AMBULATORY_CARE_PROVIDER_SITE_OTHER): Payer: Medicare Other | Admitting: Clinical

## 2016-11-28 DIAGNOSIS — F440 Dissociative amnesia: Secondary | ICD-10-CM | POA: Diagnosis not present

## 2016-11-28 DIAGNOSIS — F4321 Adjustment disorder with depressed mood: Secondary | ICD-10-CM

## 2016-11-28 DIAGNOSIS — F319 Bipolar disorder, unspecified: Secondary | ICD-10-CM

## 2016-11-28 DIAGNOSIS — F4320 Adjustment disorder, unspecified: Secondary | ICD-10-CM | POA: Diagnosis not present

## 2016-11-28 NOTE — Telephone Encounter (Signed)
Patient came by today after seeing Justin [name] to let me know he has been having daily headaches and he has not been able to sleep. I took patients BP and it was 150/82 - I advised him to speak with his primary care physician about adjusting his blood pressure medication. Please advise on patients insomnia

## 2016-11-28 NOTE — Progress Notes (Signed)
   THERAPIST PROGRESS NOTE  Session Time: 2:35-3:25  Participation Level: Active  Behavioral Response: CasualAlertDepressed   Type of Therapy: Individual Therapy  Treatment Goals addressed: improve psychiatric symptoms, Controlled Behavior, Moderated Mood, Deliberative Speech, Improve Unhelpful Thought Patterns, Emotional Regulation Skills,  Learn about Diagnosis, Healthy Coping Skills,  overwhelmed    Interventions: CBT, Motivational Interviewing, grounding techniques and mindfulness techniques, and psychoeducation  Summary: Justin [name] is a 71 y.o. male who presents with Bipolar 1 Disorder, Mixed, Moderate and Dissociative Amnesia, Grief, and PTSD  Suicidal/Homicidal: No - without intent/plan  Therapist Response:  Lynda met with clinician for an individual session. [name] discussed his psychiatric symptoms and his current life events. [name] shared this was a very difficult week for him - he experienced both depression and mania. He shared that he was experiencing a lot of migraines, high blood preasure and chest pains. [name] stopped by the nurses office after the session. He shared that he had not been sleeping well and when he did he dreamed about the first time he lost his memory (when his brother died). He shared that he felt worn out. He denied any suicidal or homicidal ideation. Client and clinician discussed his diagnosis and symptoms. Client and clinician reviewed emotional regulation skills and  Also discussed addional healthy coping skills.   Plan: Return again in 1 -2 weeks.  Diagnosis:     Axis I: Bipolar 1 Disorder, Mixed, Moderate and Dissociative Amnesia, Grief, and PTSD    Monicia Tse A, LCSW 11/28/2016

## 2016-11-29 NOTE — Telephone Encounter (Signed)
Dr. Adele Schilder had me call the patient to see if he had ever tried Trazodone. Patient stated that he had tried it in the past, but then let me know that he had not been taking his Zyprexa and he took it last night and slept fine all night. He is going to continue to take the Zyprexa.

## 2016-12-05 ENCOUNTER — Ambulatory Visit (HOSPITAL_COMMUNITY): Payer: Self-pay | Admitting: Clinical

## 2016-12-21 ENCOUNTER — Encounter (HOSPITAL_COMMUNITY): Payer: Self-pay | Admitting: Psychiatry

## 2016-12-21 ENCOUNTER — Ambulatory Visit (INDEPENDENT_AMBULATORY_CARE_PROVIDER_SITE_OTHER): Payer: Medicare Other | Admitting: Psychiatry

## 2016-12-21 VITALS — BP 106/68 | HR 73 | Ht 69.0 in | Wt 172.0 lb

## 2016-12-21 DIAGNOSIS — F319 Bipolar disorder, unspecified: Secondary | ICD-10-CM

## 2016-12-21 DIAGNOSIS — Z79899 Other long term (current) drug therapy: Secondary | ICD-10-CM

## 2016-12-21 DIAGNOSIS — Z810 Family history of intellectual disabilities: Secondary | ICD-10-CM

## 2016-12-21 DIAGNOSIS — F419 Anxiety disorder, unspecified: Secondary | ICD-10-CM

## 2016-12-21 MED ORDER — TRAZODONE HCL 50 MG PO TABS: 50.0000 mg | ORAL_TABLET | Freq: Every day | ORAL | 1 refills | Status: DC

## 2016-12-21 MED ORDER — OLANZAPINE 5 MG PO TABS: 5.0000 mg | ORAL_TABLET | Freq: Every day | ORAL | 2 refills | Status: DC

## 2016-12-21 MED ORDER — ALPRAZOLAM 0.5 MG PO TABS: 0.5000 mg | ORAL_TABLET | Freq: Two times a day (BID) | ORAL | 2 refills | Status: DC

## 2016-12-21 NOTE — Progress Notes (Signed)
[facility] MD/PA/NP OP Progress Note  12/21/2016 1:14 PM Justin [name]  MRN:  [id number]  Chief Complaint:  Subjective:  I am doing better with, trazodone and Zyprexa.  HPI: Justin [name] came for his follow-up appointment.  He is taking Zyprexa 5 mg and trazodone 50 mg as needed.  We tried 7.5 but it makes him very groggy.  He is feeling less depressed and less irritable.  He is pleased that finally his son started talking to him with him.  He was happy that he dropped his 71 year old granddaughter to dance competition.  He is seeing Justin [name] is helping his coping and social skills.  Recently he seen his primary care physician for low back pain and he was given tramadol.  Patient has no tremors, shakes or any EPS.  He denies any suicidal thoughts or homicidal thought.  He denies any mania or any psychosis.  He still have rumination and sometime negative thoughts but denies any crying spells, self abusive behavior or any agitation.  His energy level described good.  He denies drinking alcohol or using any illegal substances.  He lives by himself.  Visit Diagnosis:    ICD-9-CM ICD-10-CM   1. Bipolar I disorder (HCC) 296.7 F31.9 OLANZapine (ZYPREXA) 5 MG tablet     ALPRAZolam (XANAX) 0.5 MG tablet    Past Psychiatric History: Reviewed. Patient has bipolar disorder with at least more than 15 psychiatric hospitalization. Most of psychiatric hospitalization was due to mania, depression, suicidal attempt. He has taken overdose on his psychiatric medication including Ambien, Vistaril and alcohol. His last psychiatric hospitalization was in 2007 at [facility]. In the past he has taken Zyprexa, Cymbalta, Seroquel, Vistaril, Ambien, Neurontin , Wellbutrin and Trileptal however he do not remember the details very well. He did remember lithium cause side effects. He was seeing Dr. Letta [name] and Justin [name]. Patient has history of mania, psychosis, hallucination and severe. He recently finished  intensive outpatient program depression.  Past Medical History:  Past Medical History:  Diagnosis Date  . Anxiety   . Arthritis   . Asthma   . Bipolar disorder ([place])   . CAD S/P percutaneous coronary stenting   . CHF (congestive heart failure) ([place])   . Chicken pox   . Colon polyps   . Depression   . Dissociative amnesia   . Grief   . Hx of adenomatous colonic polyps 06/22/2016  . Hyperlipidemia   . Hypertension   . Migraines   . NSTEMI (non-ST elevated myocardial infarction) ([place])   . PTSD (post-traumatic stress disorder)     Past Surgical History:  Procedure Laterality Date  . CERVICAL DISCECTOMY    . COLONOSCOPY    . ELBOW SURGERY Left   . ELBOW SURGERY Right   . INGUINAL HERNIA REPAIR Bilateral    1996, 1997  . KNEE ARTHROSCOPY Right   . KNEE CARTILAGE SURGERY Left   . NASAL SEPTUM SURGERY    . STENT PLACEMENT VASCULAR ([facility])  09/02/2010  . TONSILLECTOMY      Family Psychiatric History: Reviewed.  Family History:  Family History  Problem Relation Age of Onset  . Arthritis Mother   . Heart disease Mother   . Hypertension Mother   . Alzheimer's disease Mother   . Heart attack Mother   . Arthritis Father   . Hyperlipidemia Father   . Heart disease Father   . Stroke Father   . Hypertension Father   . Heart attack Father   .  Multiple sclerosis Sister   . Diabetes Sister   . Lung cancer Paternal Grandmother   . Diabetes Sister   . Lung cancer Maternal Aunt   . Lung cancer Paternal Aunt     Social History:  Social History   Social History  . Marital status: Widowed    Spouse name: N/A  . Number of children: 2  . Years of education: 12   Occupational History  . Retired    Social History Main Topics  . Smoking status: Never Smoker  . Smokeless tobacco: Never Used  . Alcohol use No  . Drug use: No  . Sexual activity: Not on file   Other Topics Concern  . Not on file   Social History Narrative   Retired, widowed in 2017    1 son one  daughter   2 caffeinated beverages daily no alcohol or tobacco   Fun: Work out in the yard.   Denies religious beliefs effecting healthcare.     Allergies:  Allergies  Allergen Reactions  . Ambien [Zolpidem Tartrate] Other (See Comments)    Blackout, memory issues    Metabolic Disorder Labs: No results found for: HGBA1C, MPG No results found for: PROLACTIN Lab Results  Component Value Date   CHOL 141 03/26/2015   TRIG 87.0 03/26/2015   HDL 68.50 03/26/2015   CHOLHDL 2 03/26/2015   VLDL 17.4 03/26/2015   LDLCALC 55 03/26/2015     Current Medications: Current Outpatient Prescriptions  Medication Sig Dispense Refill  . ALPRAZolam (XANAX) 0.5 MG tablet Take 1 tablet (0.5 mg total) by mouth 2 (two) times daily. 60 tablet 2  . CARTIA XT 180 MG 24 hr capsule     . clopidogrel (PLAVIX) 75 MG tablet Take 75 mg by mouth daily.    Marland Kitchen HYDROcodone-acetaminophen (NORCO/VICODIN) 5-325 MG tablet Take 5-325 tablets by mouth 2 (two) times daily.    Marland Kitchen lamoTRIgine (LAMICTAL) 200 MG tablet Take 1 tablet (200 mg total) by mouth daily. 30 tablet 2  . nitroGLYCERIN (NITROSTAT) 0.4 MG SL tablet     . OLANZapine (ZYPREXA) 7.5 MG tablet Take 1 tablet (7.5 mg total) by mouth at bedtime. 30 tablet 2  . rosuvastatin (CRESTOR) 20 MG tablet Take 20 mg by mouth daily.    Marland Kitchen tiZANidine (ZANAFLEX) 4 MG tablet Take 1 tablet (4 mg total) by mouth every 6 (six) hours as needed for muscle spasms. 60 tablet 0  . traMADol (ULTRAM) 50 MG tablet      Current Facility-Administered Medications  Medication Dose Route Frequency Provider Last Rate Last Dose  . 0.9 %  sodium chloride infusion  500 mL Intravenous Continuous Gatha Mayer, MD        Neurologic: Headache: No Seizure: No Paresthesias: No  Musculoskeletal: Strength & Muscle Tone: within normal limits Gait & Station: normal Patient leans: N/A  Psychiatric Specialty Exam: Review of Systems  Constitutional: Negative.   Musculoskeletal: Positive for  back pain.  Skin: Negative for itching and rash.    Blood pressure 106/68, pulse 73, height 5\' 9"  (1.753 m), weight 172 lb (78 kg).There is no height or weight on file to calculate BMI.  General Appearance: Casual  Eye Contact:  Good  Speech:  Clear and Coherent  Volume:  Normal  Mood:  Euthymic  Affect:  Appropriate  Thought Process:  Goal Directed  Orientation:  Full (Time, Place, and Person)  Thought Content: WDL, Logical and Rumination   Suicidal Thoughts:  No  Homicidal Thoughts:  No  Memory:  Immediate;   Good Recent;   Good Remote;   Good  Judgement:  Good  Insight:  Good  Psychomotor Activity:  Normal  Concentration:  Concentration: Good and Attention Span: Good  Recall:  Good  Fund of Knowledge: Good  Language: Good  Akathisia:  No  Handed:  Right  AIMS (if indicated):  0  Assets:  Communication Skills Desire for Improvement Housing Resilience  ADL's:  Intact  Cognition: WNL  Sleep:  improved    Assessment: Bipolar disorder type I.  Anxiety disorder NOS.  Plan: Patient is a stable on his current psychiatric medication.  I will continue Zyprexa 5 mg at bedtime, Lamictal 200 mg daily , trazodone 50 mg as needed for insomnia and Xanax 0.5 mg twice a day.  Patient has no tremors, shakes, rash, itching or any concerns.  His sleep is improved from the past.  Recommended to continue [facility] for counseling.  Recommended to call us back if he has any question, concern or if he feel worsening of the symptom.  Follow-up in 3 months.  Demontrae Gilbert T., MD 12/21/2016, 1:14 PM

## 2016-12-22 ENCOUNTER — Ambulatory Visit (HOSPITAL_COMMUNITY): Payer: Self-pay | Admitting: Clinical

## 2017-01-04 ENCOUNTER — Telehealth (HOSPITAL_COMMUNITY): Payer: Self-pay

## 2017-01-04 ENCOUNTER — Encounter (HOSPITAL_COMMUNITY): Payer: Self-pay | Admitting: Clinical

## 2017-01-04 ENCOUNTER — Ambulatory Visit (INDEPENDENT_AMBULATORY_CARE_PROVIDER_SITE_OTHER): Payer: Medicare Other | Admitting: Clinical

## 2017-01-04 DIAGNOSIS — F4310 Post-traumatic stress disorder, unspecified: Secondary | ICD-10-CM | POA: Diagnosis not present

## 2017-01-04 DIAGNOSIS — F440 Dissociative amnesia: Secondary | ICD-10-CM

## 2017-01-04 DIAGNOSIS — F319 Bipolar disorder, unspecified: Secondary | ICD-10-CM | POA: Diagnosis not present

## 2017-01-04 DIAGNOSIS — F4320 Adjustment disorder, unspecified: Secondary | ICD-10-CM

## 2017-01-04 DIAGNOSIS — F4321 Adjustment disorder with depressed mood: Secondary | ICD-10-CM

## 2017-01-04 NOTE — Progress Notes (Signed)
   THERAPIST PROGRESS NOTE  Session Time: 1:35 - 2:25  Participation Level: Active  Behavioral Response: NeatAlertAnxious and Depressed  Type of Therapy: Individual Therapy  Treatment Goals addressed: improve psychiatric symptoms,  Improve Unhelpful Thought Patterns, Emotional Regulation Skills , Learn about Diagnosis, Healthy Coping Skills,    Interventions: CBT, Motivational Interviewing, grounding techniques and mindfulness techniques, and psychoeducation  Summary: Divonte Senger is a 71 y.o. male who presents with Bipolar 1 Disorder, Mixed, Moderate and Dissociative Amnesia, Grief, and PTSD  Suicidal/Homicidal: No - without intent/plan  Therapist Response:  Theodore met with clinician for an individual session. Bryam discussed his psychiatric symptoms and his current life events. Sinclair shared that he has been having a very difficult time with his symptoms. Clinician asked open ended questions and Eben shared that he has been experiencing increased mania, racing thoughts, ruminations, irritability, and agitation, forgetfulness, paranoia, and craving sweets. Clinician had him stop and speak to the nurse about his meds and symptoms after the session was complete. Josedaniel shared that his deceased wife's best friend passed away. He shared his thoughts and emotions about her death. Clinician asked open ended questions. He shared that it triggered deep feelings of grief about his own wife's death. Luiscarlos denied any current suicidal or homicidal ideation. Client and clinician discussed healthy coping skills. Client and clinician reviewed and discussed how to challenge and change his unhelpful thoughts. Client and clinician practiced a grounding technique together.    Plan: Return again in 1 -2 weeks.  Diagnosis:     Axis I: Bipolar 1 Disorder, Mixed, Moderate and Dissociative Amnesia, Grief, and PTSD    Maquita Sandoval A, LCSW 01/04/2017

## 2017-01-04 NOTE — Telephone Encounter (Signed)
Patient was here to see Justin [name] and she brought him to me due to medication issues. Patient endorses mania, rumination, passive SI and depression. He is currently on 200 mg Lamictal 1 po qd, Xanax 0.5 mg bid, Zyprexa 5 mg qhs, trazodone 50 mg 1 po qhs. Please review and advise, thank you

## 2017-01-05 MED ORDER — OLANZAPINE 10 MG PO TABS: 10.0000 mg | ORAL_TABLET | Freq: Every day | ORAL | 0 refills | Status: DC

## 2017-01-05 NOTE — Telephone Encounter (Signed)
He need to try a higher dose of Zyprexa.  He took once 7.5 mg and mentioned that he had a dream but I think it may be a coincident and does not directly side effects of Zyprexa.  I will suggest him to try Zyprexa 10 mg and if symptoms do not improve then we can switch to either Seroquel or Abilify.

## 2017-01-05 NOTE — Telephone Encounter (Signed)
Sent a new order for the Zyprexa to the pharmacy, I discontinued the previous prescription. I called patient and let him know and he is agreeable to this plan.

## 2017-02-02 ENCOUNTER — Ambulatory Visit (INDEPENDENT_AMBULATORY_CARE_PROVIDER_SITE_OTHER): Payer: Medicare Other | Admitting: Clinical

## 2017-02-02 ENCOUNTER — Telehealth (HOSPITAL_COMMUNITY): Payer: Self-pay

## 2017-02-02 DIAGNOSIS — F4321 Adjustment disorder with depressed mood: Secondary | ICD-10-CM

## 2017-02-02 DIAGNOSIS — F4320 Adjustment disorder, unspecified: Secondary | ICD-10-CM

## 2017-02-02 DIAGNOSIS — F440 Dissociative amnesia: Secondary | ICD-10-CM

## 2017-02-02 DIAGNOSIS — F4310 Post-traumatic stress disorder, unspecified: Secondary | ICD-10-CM | POA: Diagnosis not present

## 2017-02-02 DIAGNOSIS — F3162 Bipolar disorder, current episode mixed, moderate: Secondary | ICD-10-CM | POA: Diagnosis not present

## 2017-02-02 NOTE — Telephone Encounter (Signed)
Patient was here to see Justin [name] today and he states the the increase in Zyprexa is working some, however it is still taking patient a long tome to go to sleep and he is still waking up 2 and 3 times a night. Still having passive suicide thoughts. Please review and advise.
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
## 2017-02-06 NOTE — Telephone Encounter (Signed)
He can try Lamictal to 250 mg to help depression.  If he agreed then we can called second prescription Lamictal 50 mg daily and he can take it with 200 mg.  He need to watch carefully about rash.  If symptoms do not improve then he should contact us.

## 2017-02-07 ENCOUNTER — Encounter (HOSPITAL_COMMUNITY): Payer: Self-pay | Admitting: Clinical

## 2017-02-07 MED ORDER — LAMOTRIGINE 100 MG PO TABS: 250.0000 mg | ORAL_TABLET | Freq: Every day | ORAL | 2 refills | Status: DC

## 2017-02-07 NOTE — Telephone Encounter (Signed)
I sent the new prescription to the pharmacy and called patient to let him know.

## 2017-02-07 NOTE — Progress Notes (Addendum)
   THERAPIST PROGRESS NOTE  Session Time: 2:30 - 3:25  Participation Level: Active  Behavioral Response: CasualAlertDepressed  Type of Therapy: Individual Therapy  Treatment Goals addressed: improve psychiatric symptoms,   Emotional Regulation Skills,  Learn about Diagnosis, Healthy Coping Skills   Interventions:Motivational Interviewing,   Summary: Willow Reczek is a 71 y.o. male who presents with Bipolar 1 Disorder, Mixed, Moderate and Dissociative Amnesia, Grief, and PTSD  Suicidal/Homicidal: No - without intent/plan  Therapist Response:  Aldon met with clinician for an individual session. Nickie discussed his psychiatric symptoms and his current life events. Sophia and clinician worked together to review and update his assessment. Turrell shared he continues to struggle with his psychiatric symptoms. He shared that he has been having cycles of depression and mania. Client and clinician discussed his diagnosis, symptoms, and emotional regulation skills. Clinician had Mamoudou meet with the nurse after the session. Clinician let Josealfredo know that she would be leaving [place]. . Clinician asked open ended questions and Adarrius shared his thoughts and emotions. Because Richards symptoms continue to be elevated client and clinician discussed the possibility of attending IOP group. Kayen agreed to consider it. He has one more session with therapist. Client and clinician also discussed continuing therapy with another clinician. Client and clinician discussed healthy coping skills.  Plan: Return again in 1 -2 weeks.  Diagnosis:     Axis I: Bipolar 1 Disorder, Mixed, Moderate and Dissociative Amnesia, Grief, and PTSD    Sladen Plancarte A, LCSW 02/07/2017

## 2017-02-16 ENCOUNTER — Ambulatory Visit (INDEPENDENT_AMBULATORY_CARE_PROVIDER_SITE_OTHER): Payer: Medicare Other | Admitting: Clinical

## 2017-02-16 ENCOUNTER — Encounter (HOSPITAL_COMMUNITY): Payer: Self-pay | Admitting: Clinical

## 2017-02-16 DIAGNOSIS — F3162 Bipolar disorder, current episode mixed, moderate: Secondary | ICD-10-CM | POA: Diagnosis not present

## 2017-02-16 DIAGNOSIS — F4310 Post-traumatic stress disorder, unspecified: Secondary | ICD-10-CM

## 2017-02-16 DIAGNOSIS — F4321 Adjustment disorder with depressed mood: Secondary | ICD-10-CM

## 2017-02-16 DIAGNOSIS — F4320 Adjustment disorder, unspecified: Secondary | ICD-10-CM | POA: Diagnosis not present

## 2017-02-16 DIAGNOSIS — F440 Dissociative amnesia: Secondary | ICD-10-CM | POA: Diagnosis not present

## 2017-02-16 NOTE — Progress Notes (Signed)
   THERAPIST PROGRESS NOTE  Session Time: 2:20 -3:20   Participation Level: Active  Behavioral Response: CasualAlertAnxious and Depressed  Type of Therapy: Individual Therapy  Treatment Goals addressed: improve psychiatric symptoms, Controlled Behavior, Moderated Mood, Deliberative Speech, Improve Unhelpful Thought Patterns, Emotional Regulation Skills, Feel and express a full Range of Emotions, Healthy Coping Skills,     Interventions: CBT, Motivational Interviewing,   Summary: Justin [name] is a 70 y.o. male who presents with Bipolar 1 Disorder, Mixed, Moderate and Dissociative Amnesia, Grief, and PTSD  Suicidal/Homicidal: No - without intent/plan  Therapist Response:  Jailyn met with clinician for an individual session. Shaquill discussed his psychiatric symptoms and his current life events. Clinician asked open ended questions and Jimmie shared that he was experiencing a lot of symptoms. He shared that he was having flash backs to traumatic events. He shared his daughter was telling him what happened in the hospital which he cannot remember. He struggles with why they didn't get him help if he was behaving so oddly. He shared he is very lonesome for his wife and he has nobody to talk about her with as his family refuses. He shared he is also anxious about clinician leaving and starting over with a new clinician. Client and clinician discussed his increased symptoms and agreed he should attend IOP - an appointment was made with the IOP coordinator. Kaius and clinician discussed the progress he has made in therapy and what it will be like after IOP working with a new clinician. Client and clinician discussed his thoughts and emotions. He denied any current suicidal or homicidal ideation. Client and clinician reviewed the skills learned in therapy - coping skills and stress management, challenging and changing thought etc.    Plan: Return again in 1 -2 weeks.  Diagnosis:     Axis I:  Bipolar 1 Disorder, Mixed, Moderate and Dissociative Amnesia, Grief, and PTSD   , A, LCSW 02/16/2017  

## 2017-02-24 ENCOUNTER — Encounter: Payer: Self-pay | Admitting: Internal Medicine

## 2017-02-24 ENCOUNTER — Ambulatory Visit (INDEPENDENT_AMBULATORY_CARE_PROVIDER_SITE_OTHER): Payer: Medicare Other | Admitting: Internal Medicine

## 2017-02-24 VITALS — BP 124/78 | HR 65 | Ht 69.0 in | Wt 173.0 lb

## 2017-02-24 DIAGNOSIS — G8929 Other chronic pain: Secondary | ICD-10-CM | POA: Diagnosis not present

## 2017-02-24 DIAGNOSIS — L609 Nail disorder, unspecified: Secondary | ICD-10-CM

## 2017-02-24 DIAGNOSIS — I10 Essential (primary) hypertension: Secondary | ICD-10-CM | POA: Diagnosis not present

## 2017-02-24 DIAGNOSIS — M545 Low back pain, unspecified: Secondary | ICD-10-CM

## 2017-02-24 MED ORDER — CYCLOBENZAPRINE HCL 5 MG PO TABS: 5.0000 mg | ORAL_TABLET | Freq: Three times a day (TID) | ORAL | 1 refills | Status: DC | PRN

## 2017-02-24 MED ORDER — TRAMADOL HCL 50 MG PO TABS: 50.0000 mg | ORAL_TABLET | Freq: Four times a day (QID) | ORAL | 0 refills | Status: DC | PRN

## 2017-02-24 NOTE — Patient Instructions (Signed)
OK to watch the dark mark for now under the nail; but return if getting larger for further consideration  Please take all new medication as prescribed - the pain medication, and the muscle relaxer as needed  Please continue all other medications as before, and refills have been done if requested.  Please have the pharmacy call with any other refills you may need.  Please keep your appointments with your specialists as you may have planned

## 2017-02-24 NOTE — Progress Notes (Signed)
Subjective:    Patient ID: Justin [name], male    DOB: November 09, 1945, 71 y.o.   MRN: [id number]  HPI  Here after initially made appt about a dark spot under the left great nail not better with partial removal of the nail himself; cannot recall trauma or misstep, no other overt bleeding or bruising.  Ongoing for 1 wk, constant, not painful but just not improving.  Nothing else seems to make better or worse.  Also incidentally with 3 days onset right lower back pain, started after bending to pick something up, sharp, constant, worse to bend and twist, nothing else makes better.  Pt denies bowel or bladder change, fever, wt loss,  worsening more distal LE pain/numbness/weakness, gait change or falls. chest pain  Pt denies new neurological symptoms such as new headache, or facial or extremity weakness or numbness Past Medical History:  Diagnosis Date  . Anxiety   . Arthritis   . Asthma   . Bipolar disorder ([place])   . CAD S/P percutaneous coronary stenting   . CHF (congestive heart failure) ([place])   . Chicken pox   . Colon polyps   . Depression   . Dissociative amnesia   . Grief   . Hx of adenomatous colonic polyps 06/22/2016  . Hyperlipidemia   . Hypertension   . Migraines   . NSTEMI (non-ST elevated myocardial infarction) ([place])   . PTSD (post-traumatic stress disorder)    Past Surgical History:  Procedure Laterality Date  . CERVICAL DISCECTOMY    . COLONOSCOPY    . ELBOW SURGERY Left   . ELBOW SURGERY Right   . INGUINAL HERNIA REPAIR Bilateral    1996, 1997  . KNEE ARTHROSCOPY Right   . KNEE CARTILAGE SURGERY Left   . NASAL SEPTUM SURGERY    . STENT PLACEMENT VASCULAR ([facility])  09/02/2010  . TONSILLECTOMY      reports that he has never smoked. He has never used smokeless tobacco. He reports that he does not drink alcohol or use drugs. family history includes Alzheimer's disease in his mother; Arthritis in his father and mother; Diabetes in his sister and sister; Heart attack in his  father and mother; Heart disease in his father and mother; Hyperlipidemia in his father; Hypertension in his father and mother; Lung cancer in his maternal aunt, paternal aunt, and paternal grandmother; Multiple sclerosis in his sister; Stroke in his father. Allergies  Allergen Reactions  . Ambien [Zolpidem Tartrate] Other (See Comments)    Blackout, memory issues   Current Outpatient Prescriptions on File Prior to Visit  Medication Sig Dispense Refill  . ALPRAZolam (XANAX) 0.5 MG tablet Take 1 tablet (0.5 mg total) by mouth 2 (two) times daily. 60 tablet 2  . CARTIA XT 180 MG 24 hr capsule     . clopidogrel (PLAVIX) 75 MG tablet Take 75 mg by mouth daily.    Marland Kitchen lamoTRIgine (LAMICTAL) 100 MG tablet Take 2.5 tablets (250 mg total) by mouth daily. 75 tablet 2  . nitroGLYCERIN (NITROSTAT) 0.4 MG SL tablet     . OLANZapine (ZYPREXA) 10 MG tablet Take 1 tablet (10 mg total) by mouth at bedtime. 90 tablet 0  . rosuvastatin (CRESTOR) 20 MG tablet Take 20 mg by mouth daily.    Marland Kitchen tiZANidine (ZANAFLEX) 4 MG tablet Take 1 tablet (4 mg total) by mouth every 6 (six) hours as needed for muscle spasms. 60 tablet 0  . traZODone (DESYREL) 50 MG tablet Take 1 tablet (50  mg total) by mouth at bedtime. 30 tablet 1   Current Facility-Administered Medications on File Prior to Visit  Medication Dose Route Frequency Provider Last Rate Last Dose  . 0.9 %  sodium chloride infusion  500 mL Intravenous Continuous Gatha Mayer, MD       Review of Systems  Constitutional: Negative for other unusual diaphoresis or sweats HENT: Negative for ear discharge or swelling Eyes: Negative for other worsening visual disturbances Respiratory: Negative for stridor or other swelling  Gastrointestinal: Negative for worsening distension or other blood Genitourinary: Negative for retention or other urinary change Musculoskeletal: Negative for other MSK pain or swelling Skin: Negative for color change or other new  lesions Neurological: Negative for worsening tremors and other numbness  Psychiatric/Behavioral: Negative for worsening agitation or other fatigue All other system neg per pt    Objective:   Physical Exam BP 124/78   Pulse 65   Ht 5\' 9"  (1.753 m)   Wt 173 lb (78.5 kg)   SpO2 98%   BMI 25.55 kg/m  VS noted,  Constitutional: Pt appears in NAD HENT: Head: NCAT.  Right Ear: External ear normal.  Left Ear: External ear normal.  Eyes: . Pupils are equal, round, and reactive to light. Conjunctivae and EOM are normal Nose: without d/c or deformity Neck: Neck supple. Gross normal ROM Cardiovascular: Normal rate and regular rhythm.   Pulmonary/Chest: Effort normal and breath sounds without rales or wheezing.  Abd:  Soft, NT, ND, + BS, no organomegaly Spine: nontender throughout midline Right lumbar paravertebral tender without rash or swellingf Neurological: Pt is alert. At baseline orientation, motor grossly intact Skin: Skin is warm. No rashes, no LE edema, but left great nail with 5 mm area dark superficial subungual, NT Psychiatric: Pt behavior is normal without agitation  No other exam findings    Assessment & Plan:

## 2017-02-25 DIAGNOSIS — L609 Nail disorder, unspecified: Secondary | ICD-10-CM | POA: Insufficient documentation

## 2017-02-25 NOTE — Assessment & Plan Note (Signed)
D/w pt, exam c/w probable subungual hematoma, very small and asymptomatic, doubt melanoma but should return if not improved in 2-3 wks or enlarges

## 2017-02-25 NOTE — Assessment & Plan Note (Signed)
Exam c/w msk strain, for pain control, muscle relaxer pn,  to f/u any worsening symptoms or concerns, consider sport med and/or PT if not improived

## 2017-02-25 NOTE — Assessment & Plan Note (Signed)
stable overall by history and exam, recent data reviewed with pt, and pt to continue medical treatment as before,  to f/u any worsening symptoms or concerns BP Readings from Last 3 Encounters:  02/24/17 124/78  10/10/16 122/72  06/15/16 109/67

## 2017-03-02 ENCOUNTER — Ambulatory Visit (HOSPITAL_COMMUNITY): Payer: Self-pay | Admitting: Clinical

## 2017-03-16 ENCOUNTER — Ambulatory Visit (HOSPITAL_COMMUNITY): Payer: Self-pay | Admitting: Clinical

## 2017-03-22 HISTORY — PX: COLONOSCOPY: SHX174

## 2017-03-23 ENCOUNTER — Encounter (HOSPITAL_COMMUNITY): Payer: Self-pay | Admitting: Psychiatry

## 2017-03-23 ENCOUNTER — Ambulatory Visit (INDEPENDENT_AMBULATORY_CARE_PROVIDER_SITE_OTHER): Payer: Medicare Other | Admitting: Psychiatry

## 2017-03-23 VITALS — BP 110/74 | HR 75 | Ht 69.5 in | Wt 174.0 lb

## 2017-03-23 DIAGNOSIS — F319 Bipolar disorder, unspecified: Secondary | ICD-10-CM

## 2017-03-23 DIAGNOSIS — Z810 Family history of intellectual disabilities: Secondary | ICD-10-CM | POA: Diagnosis not present

## 2017-03-23 DIAGNOSIS — F419 Anxiety disorder, unspecified: Secondary | ICD-10-CM

## 2017-03-23 MED ORDER — OLANZAPINE 10 MG PO TABS: 10.0000 mg | ORAL_TABLET | Freq: Every day | ORAL | 0 refills | Status: DC

## 2017-03-23 MED ORDER — LAMOTRIGINE 100 MG PO TABS: 250.0000 mg | ORAL_TABLET | Freq: Every day | ORAL | 2 refills | Status: DC

## 2017-03-23 MED ORDER — ALPRAZOLAM 0.5 MG PO TABS: 0.5000 mg | ORAL_TABLET | Freq: Two times a day (BID) | ORAL | 2 refills | Status: DC

## 2017-03-23 NOTE — Progress Notes (Signed)
[facility] MD/PA/NP OP Progress Note  03/23/2017 2:47 PM Justin [name]  MRN:  [id number]  Chief Complaint:  Chief Complaint    Follow-up     Subjective:  I am feeling better but is still have some nights I cannot sleep very good.  HPI: Justin [name] came for his follow-up appointment.  He is taking his medication.  His Lamictal and Zyprexa was increased recently because he was complaining of poor sleep and racing thoughts.  He is seeing Tharon Aquas but now Tharon Aquas (he need to find a new therapist.  He felt much better with increase Lamictal and Zyprexa.  He has no rash or itching.  He is also taking Xanax 0.5 mg twice a day to help his anxiety.  He denies any irritability, anger, mania or any psychosis.  Denies any suicidal thoughts or any feeling of hopelessness or worthlessness.  He sometime have rumination about his past and endorse family stress but he is handling much better than before.  His energy level is good.  He denies drinking alcohol or using any illegal substances.  He lives by himself.  Visit Diagnosis:    ICD-10-CM   1. Bipolar I disorder (HCC) F31.9 OLANZapine (ZYPREXA) 10 MG tablet    lamoTRIgine (LAMICTAL) 100 MG tablet    ALPRAZolam (XANAX) 0.5 MG tablet    Past Psychiatric History: Reviewed. Patient has bipolar disorder with at least more than 15 psychiatric hospitalization. Most of psychiatric hospitalization was due to mania, depression, suicidal attempt. He has taken overdose on his psychiatric medication including Ambien, Vistaril and alcohol. His last psychiatric hospitalization was in 2007 at [facility]. In the past he has taken Zyprexa, Cymbalta, Seroquel, Vistaril, Ambien, Neurontin , Wellbutrin and Trileptal however he do not remember the details very well. He did remember lithium cause side effects. He was seeing Dr. Letta Moynahan and Parthenia Ames. Patient has history of mania, psychosis, hallucination and severe. He recently finished intensive outpatient  program depression.  Past Medical History:  Past Medical History:  Diagnosis Date  . Anxiety   . Arthritis   . Asthma   . Bipolar disorder ([place])   . CAD S/P percutaneous coronary stenting   . CHF (congestive heart failure) ([place])   . Chicken pox   . Colon polyps   . Depression   . Dissociative amnesia   . Grief   . Hx of adenomatous colonic polyps 06/22/2016  . Hyperlipidemia   . Hypertension   . Migraines   . NSTEMI (non-ST elevated myocardial infarction) ([place])   . PTSD (post-traumatic stress disorder)     Past Surgical History:  Procedure Laterality Date  . CERVICAL DISCECTOMY    . COLONOSCOPY    . ELBOW SURGERY Left   . ELBOW SURGERY Right   . INGUINAL HERNIA REPAIR Bilateral    1996, 1997  . KNEE ARTHROSCOPY Right   . KNEE CARTILAGE SURGERY Left   . NASAL SEPTUM SURGERY    . STENT PLACEMENT VASCULAR ([facility])  09/02/2010  . TONSILLECTOMY      Family Psychiatric History: Reviewed.  Family History:  Family History  Problem Relation Age of Onset  . Arthritis Mother   . Heart disease Mother   . Hypertension Mother   . Alzheimer's disease Mother   . Heart attack Mother   . Arthritis Father   . Hyperlipidemia Father   . Heart disease Father   . Stroke Father   . Hypertension Father   . Heart attack Father   .  Multiple sclerosis Sister   . Diabetes Sister   . Lung cancer Paternal Grandmother   . Diabetes Sister   . Lung cancer Maternal Aunt   . Lung cancer Paternal Aunt     Social History:  Social History   Social History  . Marital status: Widowed    Spouse name: N/A  . Number of children: 2  . Years of education: 12   Occupational History  . Retired    Social History Main Topics  . Smoking status: Never Smoker  . Smokeless tobacco: Never Used  . Alcohol use No  . Drug use: No  . Sexual activity: Not Currently   Other Topics Concern  . None   Social History Narrative   Retired, widowed in 2017    1 son one daughter   2 caffeinated  beverages daily no alcohol or tobacco   Fun: Work out in the yard.   Denies religious beliefs effecting healthcare.     Allergies:  Allergies  Allergen Reactions  . Ambien [Zolpidem Tartrate] Other (See Comments)    Blackout, memory issues    Metabolic Disorder Labs: No results found for: HGBA1C, MPG No results found for: PROLACTIN Lab Results  Component Value Date   CHOL 141 03/26/2015   TRIG 87.0 03/26/2015   HDL 68.50 03/26/2015   CHOLHDL 2 03/26/2015   VLDL 17.4 03/26/2015   LDLCALC 55 03/26/2015     Current Medications: Current Outpatient Prescriptions  Medication Sig Dispense Refill  . ALPRAZolam (XANAX) 0.5 MG tablet Take 1 tablet (0.5 mg total) by mouth 2 (two) times daily. 60 tablet 2  . CARTIA XT 180 MG 24 hr capsule     . clopidogrel (PLAVIX) 75 MG tablet Take 75 mg by mouth daily.    . cyclobenzaprine (FLEXERIL) 5 MG tablet Take 1 tablet (5 mg total) by mouth 3 (three) times daily as needed for muscle spasms. 40 tablet 1  . lamoTRIgine (LAMICTAL) 100 MG tablet Take 2.5 tablets (250 mg total) by mouth daily. 75 tablet 2  . nitroGLYCERIN (NITROSTAT) 0.4 MG SL tablet     . OLANZapine (ZYPREXA) 10 MG tablet Take 1 tablet (10 mg total) by mouth at bedtime. 90 tablet 0  . rosuvastatin (CRESTOR) 20 MG tablet Take 20 mg by mouth daily.    Marland Kitchen tiZANidine (ZANAFLEX) 4 MG tablet Take 1 tablet (4 mg total) by mouth every 6 (six) hours as needed for muscle spasms. 60 tablet 0  . traMADol (ULTRAM) 50 MG tablet Take 1 tablet (50 mg total) by mouth every 6 (six) hours as needed. 40 tablet 0  . traZODone (DESYREL) 50 MG tablet Take 1 tablet (50 mg total) by mouth at bedtime. 30 tablet 1   Current Facility-Administered Medications  Medication Dose Route Frequency Provider Last Rate Last Dose  . 0.9 %  sodium chloride infusion  500 mL Intravenous Continuous Gatha Mayer, MD        Neurologic: Headache: No Seizure: No Paresthesias: No  Musculoskeletal: Strength & Muscle  Tone: within normal limits Gait & Station: normal Patient leans: N/A  Psychiatric Specialty Exam: ROS  Blood pressure 110/74, pulse 75, height 5' 9.5" (1.765 m), weight 174 lb (78.9 kg).Body mass index is 25.33 kg/m.  General Appearance: Casual  Eye Contact:  Good  Speech:  Clear and Coherent  Volume:  Increased  Mood:  Euthymic  Affect:  Appropriate  Thought Process:  Goal Directed  Orientation:  Full (Time, Place, and Person)  Thought Content:  Logical   Suicidal Thoughts:  No  Homicidal Thoughts:  No  Memory:  Immediate;   Good Recent;   Good Remote;   Good  Judgement:  Good  Insight:  Good  Psychomotor Activity:  Normal  Concentration:  Concentration: Good and Attention Span: Good  Recall:  Good  Fund of Knowledge: Good  Language: Good  Akathisia:  No  Handed:  Right  AIMS (if indicated):  0  Assets:  Communication Skills Desire for Improvement  ADL's:  Intact  Cognition: WNL  Sleep:  ok    Assessment: Bipolar disorder type I.  Anxiety disorder NOS.  Plan: Patient is doing better since Zyprexa dose to 10 mg and Lamictal to 250 mg.  He has no rash, itching, tremors or shakes.  He takes trazodone only as needed.  He will need therapist for individual counseling.  Overall he is improving slowly and gradually.  Recommended to call us back if he has any question or any concern.  Follow-up in 3 months.     Estel Tonelli T., MD 03/23/2017, 2:47 PM

## 2017-03-30 ENCOUNTER — Ambulatory Visit (HOSPITAL_COMMUNITY): Payer: Self-pay | Admitting: Clinical

## 2017-04-05 ENCOUNTER — Encounter (HOSPITAL_COMMUNITY): Payer: Self-pay | Admitting: Psychiatry

## 2017-04-05 ENCOUNTER — Other Ambulatory Visit (HOSPITAL_COMMUNITY): Payer: Medicare Other

## 2017-04-05 ENCOUNTER — Other Ambulatory Visit (HOSPITAL_COMMUNITY): Payer: Medicare Other | Attending: Psychiatry | Admitting: Psychiatry

## 2017-04-05 DIAGNOSIS — I2510 Atherosclerotic heart disease of native coronary artery without angina pectoris: Secondary | ICD-10-CM | POA: Diagnosis not present

## 2017-04-05 DIAGNOSIS — R45851 Suicidal ideations: Secondary | ICD-10-CM | POA: Diagnosis not present

## 2017-04-05 DIAGNOSIS — Z86010 Personal history of colonic polyps: Secondary | ICD-10-CM | POA: Diagnosis not present

## 2017-04-05 DIAGNOSIS — Z9889 Other specified postprocedural states: Secondary | ICD-10-CM | POA: Insufficient documentation

## 2017-04-05 DIAGNOSIS — I509 Heart failure, unspecified: Secondary | ICD-10-CM | POA: Diagnosis not present

## 2017-04-05 DIAGNOSIS — E785 Hyperlipidemia, unspecified: Secondary | ICD-10-CM | POA: Insufficient documentation

## 2017-04-05 DIAGNOSIS — Z801 Family history of malignant neoplasm of trachea, bronchus and lung: Secondary | ICD-10-CM | POA: Insufficient documentation

## 2017-04-05 DIAGNOSIS — Z8261 Family history of arthritis: Secondary | ICD-10-CM | POA: Insufficient documentation

## 2017-04-05 DIAGNOSIS — G4700 Insomnia, unspecified: Secondary | ICD-10-CM | POA: Diagnosis not present

## 2017-04-05 DIAGNOSIS — R413 Other amnesia: Secondary | ICD-10-CM | POA: Diagnosis not present

## 2017-04-05 DIAGNOSIS — Z823 Family history of stroke: Secondary | ICD-10-CM | POA: Insufficient documentation

## 2017-04-05 DIAGNOSIS — Z955 Presence of coronary angioplasty implant and graft: Secondary | ICD-10-CM | POA: Insufficient documentation

## 2017-04-05 DIAGNOSIS — I252 Old myocardial infarction: Secondary | ICD-10-CM | POA: Diagnosis not present

## 2017-04-05 DIAGNOSIS — F4310 Post-traumatic stress disorder, unspecified: Secondary | ICD-10-CM | POA: Diagnosis not present

## 2017-04-05 DIAGNOSIS — Z888 Allergy status to other drugs, medicaments and biological substances status: Secondary | ICD-10-CM | POA: Diagnosis not present

## 2017-04-05 DIAGNOSIS — F3132 Bipolar disorder, current episode depressed, moderate: Secondary | ICD-10-CM | POA: Diagnosis not present

## 2017-04-05 DIAGNOSIS — J45909 Unspecified asthma, uncomplicated: Secondary | ICD-10-CM | POA: Diagnosis not present

## 2017-04-05 DIAGNOSIS — Z79899 Other long term (current) drug therapy: Secondary | ICD-10-CM | POA: Diagnosis not present

## 2017-04-05 DIAGNOSIS — Z8249 Family history of ischemic heart disease and other diseases of the circulatory system: Secondary | ICD-10-CM | POA: Insufficient documentation

## 2017-04-05 DIAGNOSIS — Z833 Family history of diabetes mellitus: Secondary | ICD-10-CM | POA: Diagnosis not present

## 2017-04-05 DIAGNOSIS — I110 Hypertensive heart disease with heart failure: Secondary | ICD-10-CM | POA: Insufficient documentation
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
## 2017-04-05 NOTE — Progress Notes (Signed)
    Daily Group Progress Note  Program: IOP  Group Time: 9:00-12:00  Participation Level: Active  Behavioral Response: Appropriate  Type of Therapy:  Group Therapy  Summary of Progress: Pt.'s first day in group. Pt. Met with case manager and psychiatrist for intake assessment. Pt. Participated in second day of group's discussion about cognitive modeling of unintentional versus intentional models. Pt. Asked question of group about fears that he may have dementia. Pt. Was able to process these fears with the group and receive some consolation that the fears were unfounded. Pt. Shared that his children tell him that he has dementia frequently and he has developed fear about this. Pt. Was also able to share his story of his wife's death and the loss of connection with his son and his daughter.     Nancie Neas, LPC

## 2017-04-06 ENCOUNTER — Other Ambulatory Visit (HOSPITAL_COMMUNITY): Payer: Medicare Other

## 2017-04-06 ENCOUNTER — Other Ambulatory Visit (HOSPITAL_COMMUNITY): Payer: Self-pay

## 2017-04-07 ENCOUNTER — Other Ambulatory Visit (HOSPITAL_COMMUNITY): Payer: Medicare Other

## 2017-04-07 ENCOUNTER — Other Ambulatory Visit (HOSPITAL_COMMUNITY): Payer: Self-pay

## 2017-04-07 ENCOUNTER — Other Ambulatory Visit (HOSPITAL_COMMUNITY): Payer: Medicare Other | Admitting: Psychiatry

## 2017-04-07 DIAGNOSIS — F3132 Bipolar disorder, current episode depressed, moderate: Secondary | ICD-10-CM | POA: Diagnosis not present

## 2017-04-10 ENCOUNTER — Other Ambulatory Visit (HOSPITAL_COMMUNITY): Payer: Self-pay

## 2017-04-10 ENCOUNTER — Other Ambulatory Visit (HOSPITAL_COMMUNITY): Payer: Medicare Other

## 2017-04-10 NOTE — Progress Notes (Signed)
    Daily Group Progress Note  Program: IOP    Group Time: 9:00-12:00   Participation Level: Active   Behavioral Response: Appropriate   Type of Therapy:  Group Therapy   Summary of Progress: Pt. presented as quiet but engaged.  Pt reported that he felt good about having shared with the group and felt like there were still some things he wanted to get feedback about.  Pt. watched Ted Talk about positive psychology and engaged in group discussion regarding incorporating healthy habits, such as gratitude, journaling, acts of kindness, exercise and meditation into daily life.  Counselor presented RAIN model of mindfulness.  Pt. said he plans to visit his younger sister this weekend.  Nancie Neas, LPC

## 2017-04-11 ENCOUNTER — Ambulatory Visit (HOSPITAL_COMMUNITY): Payer: Self-pay

## 2017-04-11 ENCOUNTER — Other Ambulatory Visit (HOSPITAL_COMMUNITY): Payer: Self-pay

## 2017-04-11 ENCOUNTER — Other Ambulatory Visit (HOSPITAL_COMMUNITY): Payer: Medicare Other

## 2017-04-12 ENCOUNTER — Other Ambulatory Visit (HOSPITAL_COMMUNITY): Payer: Self-pay

## 2017-04-12 ENCOUNTER — Telehealth (HOSPITAL_COMMUNITY): Payer: Self-pay | Admitting: Psychiatry

## 2017-04-12 ENCOUNTER — Other Ambulatory Visit (HOSPITAL_COMMUNITY): Payer: Medicare Other

## 2017-04-13 ENCOUNTER — Other Ambulatory Visit (HOSPITAL_COMMUNITY): Payer: Self-pay

## 2017-04-13 ENCOUNTER — Ambulatory Visit (HOSPITAL_COMMUNITY): Payer: Self-pay

## 2017-04-13 ENCOUNTER — Other Ambulatory Visit (HOSPITAL_COMMUNITY): Payer: Medicare Other

## 2017-04-13 ENCOUNTER — Ambulatory Visit (HOSPITAL_COMMUNITY): Payer: Self-pay | Admitting: Clinical

## 2017-04-14 ENCOUNTER — Other Ambulatory Visit (HOSPITAL_COMMUNITY): Payer: Self-pay

## 2017-04-14 ENCOUNTER — Other Ambulatory Visit (HOSPITAL_COMMUNITY): Payer: Medicare Other

## 2017-04-17 ENCOUNTER — Other Ambulatory Visit (HOSPITAL_COMMUNITY): Payer: Self-pay

## 2017-04-17 ENCOUNTER — Other Ambulatory Visit (HOSPITAL_COMMUNITY): Payer: Medicare Other | Admitting: Psychiatry

## 2017-04-17 ENCOUNTER — Other Ambulatory Visit (HOSPITAL_COMMUNITY): Payer: Medicare Other

## 2017-04-17 ENCOUNTER — Telehealth (HOSPITAL_COMMUNITY): Payer: Self-pay | Admitting: Psychiatry

## 2017-04-17 NOTE — Progress Notes (Signed)
Justin [name] is a 71 y.o., widowed, male, who presented as self referral due to escalating symptoms of depression. Pt reported he was seeing a therapist for approximately 15 months about weekly. He left the agency a month an a half ago and he has not been in treatment since. Pt reported steady decline since not seeing his therapist. Pt stated stressors of conflictual relationship with his adult children, loss of his wife over a year ago, and lack of social support.  Pt only attended two days and didn't return due to stomach virus.  Spoke to pt this morning and he is requesting individual therapy instead of group.  Denied SI/HI or A/V hallucinations.  A:  D/C today.  F/U with new therapist in clinic at [facility].  Pt will also f/u with outpt psychiatrist.  Encouraged support groups.  R:  Pt receptive.      Carlis Abbott, RITA, M.Ed, CNA

## 2017-04-18 ENCOUNTER — Other Ambulatory Visit (HOSPITAL_COMMUNITY): Payer: Medicare Other

## 2017-04-18 ENCOUNTER — Other Ambulatory Visit (HOSPITAL_COMMUNITY): Payer: Medicare Other | Admitting: Psychiatry

## 2017-04-18 ENCOUNTER — Other Ambulatory Visit (HOSPITAL_COMMUNITY): Payer: Self-pay

## 2017-04-18 ENCOUNTER — Ambulatory Visit (HOSPITAL_COMMUNITY): Payer: Self-pay

## 2017-04-18 NOTE — Progress Notes (Signed)
Patient ID: Justin [name], male   DOB: 26-Aug-1945, 71 y.o.   MRN: [id number]  [facility] IOP DISCHARGE NOTE  Patient:  Justin [name] DOB:  [birth year]  Date of Admission: 04/05/2017  Date of Discharge: 04/18/2017  Reason for Admission:depression  IOP Course:[name] was ambivalent about group.  He attended a couple of times and decided he would do better with 1:1 therapy,  Mental Status at Discharge:remains depressed, no mania, still passive suicidal ideation with no intent  Diagnosis: bipolar 1 depressed, moderate  Level of Care:  IOP  Discharge destination: has appointments with his psychiatrist and therapist      Comments:  Could benefit from group but has to take care of his dog and just does not want to commit to the time currently.  Overall he looks better in some ways in spite of significant depression probably due to the therapy he was getting  The patient received suicide prevention pamphlet:  Yes   Donnelly Angelica, MD

## 2017-04-19 ENCOUNTER — Other Ambulatory Visit (HOSPITAL_COMMUNITY): Payer: Medicare Other

## 2017-04-19 ENCOUNTER — Other Ambulatory Visit (HOSPITAL_COMMUNITY): Payer: Self-pay

## 2017-04-20 ENCOUNTER — Other Ambulatory Visit (HOSPITAL_COMMUNITY): Payer: Medicare Other

## 2017-04-20 ENCOUNTER — Other Ambulatory Visit (HOSPITAL_COMMUNITY): Payer: Self-pay

## 2017-04-20 ENCOUNTER — Ambulatory Visit (HOSPITAL_COMMUNITY): Payer: Self-pay

## 2017-04-21 ENCOUNTER — Other Ambulatory Visit (HOSPITAL_COMMUNITY): Payer: Medicare Other

## 2017-04-21 ENCOUNTER — Other Ambulatory Visit (HOSPITAL_COMMUNITY): Payer: Self-pay

## 2017-04-25 ENCOUNTER — Other Ambulatory Visit (HOSPITAL_COMMUNITY): Payer: Medicare Other

## 2017-04-25 ENCOUNTER — Ambulatory Visit (INDEPENDENT_AMBULATORY_CARE_PROVIDER_SITE_OTHER): Payer: Medicare Other | Admitting: Clinical

## 2017-04-25 ENCOUNTER — Encounter (HOSPITAL_COMMUNITY): Payer: Self-pay | Admitting: Clinical

## 2017-04-25 ENCOUNTER — Other Ambulatory Visit (HOSPITAL_COMMUNITY): Payer: Self-pay

## 2017-04-25 ENCOUNTER — Ambulatory Visit (HOSPITAL_COMMUNITY): Payer: Self-pay

## 2017-04-25 DIAGNOSIS — F3130 Bipolar disorder, current episode depressed, mild or moderate severity, unspecified: Secondary | ICD-10-CM
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
## 2017-04-26 ENCOUNTER — Other Ambulatory Visit (HOSPITAL_COMMUNITY): Payer: Medicare Other

## 2017-04-26 ENCOUNTER — Other Ambulatory Visit (HOSPITAL_COMMUNITY): Payer: Self-pay

## 2017-04-27 ENCOUNTER — Ambulatory Visit (HOSPITAL_COMMUNITY): Payer: Self-pay

## 2017-04-27 ENCOUNTER — Other Ambulatory Visit (HOSPITAL_COMMUNITY): Payer: Self-pay

## 2017-04-27 ENCOUNTER — Other Ambulatory Visit (HOSPITAL_COMMUNITY): Payer: Medicare Other

## 2017-04-28 ENCOUNTER — Other Ambulatory Visit (HOSPITAL_COMMUNITY): Payer: Self-pay

## 2017-04-28 ENCOUNTER — Other Ambulatory Visit (HOSPITAL_COMMUNITY): Payer: Medicare Other

## 2017-05-01 ENCOUNTER — Other Ambulatory Visit (HOSPITAL_COMMUNITY): Payer: Self-pay

## 2017-05-01 ENCOUNTER — Other Ambulatory Visit (HOSPITAL_COMMUNITY): Payer: Medicare Other

## 2017-05-01 ENCOUNTER — Other Ambulatory Visit: Payer: Self-pay | Admitting: Internal Medicine

## 2017-05-02 ENCOUNTER — Ambulatory Visit (HOSPITAL_COMMUNITY): Payer: Self-pay

## 2017-05-02 ENCOUNTER — Other Ambulatory Visit (HOSPITAL_COMMUNITY): Payer: Medicare Other

## 2017-05-02 ENCOUNTER — Other Ambulatory Visit (HOSPITAL_COMMUNITY): Payer: Self-pay

## 2017-05-03 ENCOUNTER — Encounter (HOSPITAL_COMMUNITY): Payer: Self-pay | Admitting: Licensed Clinical Social Worker

## 2017-05-03 ENCOUNTER — Ambulatory Visit (INDEPENDENT_AMBULATORY_CARE_PROVIDER_SITE_OTHER): Payer: Medicare Other | Admitting: Licensed Clinical Social Worker

## 2017-05-03 ENCOUNTER — Other Ambulatory Visit (HOSPITAL_COMMUNITY): Payer: Medicare Other

## 2017-05-03 ENCOUNTER — Other Ambulatory Visit (HOSPITAL_COMMUNITY): Payer: Self-pay

## 2017-05-03 DIAGNOSIS — F440 Dissociative amnesia: Secondary | ICD-10-CM | POA: Diagnosis not present

## 2017-05-03 DIAGNOSIS — F4310 Post-traumatic stress disorder, unspecified: Secondary | ICD-10-CM | POA: Diagnosis not present

## 2017-05-03 DIAGNOSIS — F3162 Bipolar disorder, current episode mixed, moderate: Secondary | ICD-10-CM

## 2017-05-03 NOTE — Progress Notes (Signed)
   THERAPIST PROGRESS NOTE  Session Time:1:30pm-2:30pm  Participation Level: Active  Behavioral Response: CasualAlertDepressed  Type of Therapy: Individual Therapy  Treatment Goals addressed: improve psychiatric symptoms, Controlled Behavior, Moderated Mood, Deliberative Speech (improved social functioning), Improve Unhelpful Thought Patterns, Emotional Regulation Skills (Moderate moods, anger management, stress management), Feel and express a full Range of Emotions, Learn about Diagnosis, Healthy Coping Skills, Recall the Traumatic event without being overwhelmed    Interventions: CBT, Motivational Interviewing, grounding techniques and mindfulness techniques, and psychoeducation  Summary: Kathan Kirker is a 71 y.o. male who presents with Bipolar 1 Disorder, Mixed, Moderate and Dissociative Amnesia, Grief, and PTSD  Suicidal/Homicidal: No - without intent/plan  Therapist Response:  Javyon met with clinician for an individual session. Dabney discussed his psychiatric symptoms, his current life events and his homework. Niccolo reports ongoing depression and frustration about the way he has been treated by his children following the loss of his wife. Clinician explored possible alternative explanations about son's bxs and his ability to cope with this loss. Clinician also utilized CBT to process Kerwin's thoughts, feelings, and behaviors. Kimoni reported ongoing isolation due to lack of motivation and some anxiety about joining social groups. Gustav also processed feelings about current failing health of his dog.     Plan: Return again in 1 -2 weeks.  Diagnosis:     Axis I: Bipolar 1 Disorder, Mixed, Moderate and Dissociative Amnesia, Grief, and PTSD

## 2017-05-04 ENCOUNTER — Other Ambulatory Visit (HOSPITAL_COMMUNITY): Payer: Self-pay

## 2017-05-04 ENCOUNTER — Ambulatory Visit (HOSPITAL_COMMUNITY): Payer: Self-pay

## 2017-05-04 ENCOUNTER — Other Ambulatory Visit (HOSPITAL_COMMUNITY): Payer: Medicare Other

## 2017-05-05 ENCOUNTER — Telehealth: Payer: Self-pay | Admitting: Internal Medicine

## 2017-05-05 ENCOUNTER — Other Ambulatory Visit (HOSPITAL_COMMUNITY): Payer: Medicare Other

## 2017-05-05 ENCOUNTER — Other Ambulatory Visit (HOSPITAL_COMMUNITY): Payer: Self-pay

## 2017-05-05 NOTE — Telephone Encounter (Signed)
Patient would like to know if he could transfer from [facility] to Dr. Jenny Reichmann since [name] is leaving?

## 2017-05-07 NOTE — Telephone Encounter (Signed)
Ok with me 

## 2017-05-08 ENCOUNTER — Other Ambulatory Visit (HOSPITAL_COMMUNITY): Payer: Medicare Other

## 2017-05-08 ENCOUNTER — Other Ambulatory Visit (HOSPITAL_COMMUNITY): Payer: Self-pay

## 2017-05-08 NOTE — Telephone Encounter (Signed)
Got patient scheduled

## 2017-05-09 ENCOUNTER — Other Ambulatory Visit (HOSPITAL_COMMUNITY): Payer: Medicare Other

## 2017-05-09 ENCOUNTER — Ambulatory Visit (INDEPENDENT_AMBULATORY_CARE_PROVIDER_SITE_OTHER): Payer: Medicare Other | Admitting: Licensed Clinical Social Worker

## 2017-05-09 ENCOUNTER — Ambulatory Visit (HOSPITAL_COMMUNITY): Payer: Self-pay

## 2017-05-09 ENCOUNTER — Encounter (HOSPITAL_COMMUNITY): Payer: Self-pay | Admitting: Licensed Clinical Social Worker

## 2017-05-09 ENCOUNTER — Other Ambulatory Visit (HOSPITAL_COMMUNITY): Payer: Self-pay

## 2017-05-09 DIAGNOSIS — F3130 Bipolar disorder, current episode depressed, mild or moderate severity, unspecified: Secondary | ICD-10-CM

## 2017-05-09 NOTE — Progress Notes (Signed)
THERAPIST PROGRESS NOTE  Session Time: 1:30pm-2:30pm  Participation Level: Active  Behavioral Response: NeatAlertDepressed  Type of Therapy: Individual Therapy  Treatment Goals addressed: improve psychiatric symptoms, Controlled Behavior, Moderated Mood, Deliberative Speech (improved social functioning), Improve Unhelpful Thought Patterns, Emotional Regulation Skills (Moderate moods, anger management, stress management), Feel and express a full Range of Emotions, Learn about Diagnosis, Healthy Coping Skills, Recall the Traumatic event without being overwhelmed    Interventions: CBT, Motivational Interviewing, grounding techniques and mindfulness techniques, and psychoeducation  Summary: Justin [name] is a 71 y.o. male who presents with Bipolar 1 Disorder, Mixed, Moderate and Dissociative Amnesia, Grief, and PTSD  Suicidal/Homicidal: No - without intent/plan  Therapist Response:  Justin [name] met with clinician for an individual session. Justin [name] discussed his psychiatric symptoms, his current life events and his homework. Justin [name] reported increase in suicidal thoughts, especially due to recent negative interactions with son. Justin [name] reported no intent, but fleeting thoughts about wanting to go to sleep and not wake up. Justin [name] identified ongoing pain with the loss of his wife and his family. Justin [name] also identified concern about letting go and moving on due to fear that he would forget her. Clinician utilized supportive grief counseling, as well as cognitive processing in order to address the value of love in his life and the difference between letting go of love vs. Letting go of the hurt of the loss.  Justin [name] also processed the impending loss of his dog.   Plan: Return again in 1 -2 weeks.  Diagnosis:     Axis I: Bipolar 1 Disorder, Mixed, Moderate and Dissociative Amnesia, Grief, and PTSD   [name] 05/09/2017

## 2017-05-10 ENCOUNTER — Other Ambulatory Visit (HOSPITAL_COMMUNITY): Payer: Self-pay

## 2017-05-10 ENCOUNTER — Other Ambulatory Visit (HOSPITAL_COMMUNITY): Payer: Medicare Other

## 2017-05-11 ENCOUNTER — Ambulatory Visit (HOSPITAL_COMMUNITY): Payer: Self-pay

## 2017-05-11 ENCOUNTER — Other Ambulatory Visit (HOSPITAL_COMMUNITY): Payer: Medicare Other

## 2017-05-11 ENCOUNTER — Other Ambulatory Visit (HOSPITAL_COMMUNITY): Payer: Self-pay

## 2017-05-12 ENCOUNTER — Other Ambulatory Visit (HOSPITAL_COMMUNITY): Payer: Medicare Other

## 2017-05-12 ENCOUNTER — Other Ambulatory Visit (HOSPITAL_COMMUNITY): Payer: Self-pay

## 2017-05-15 ENCOUNTER — Other Ambulatory Visit (HOSPITAL_COMMUNITY): Payer: Medicare Other

## 2017-05-16 ENCOUNTER — Other Ambulatory Visit (HOSPITAL_COMMUNITY): Payer: Medicare Other

## 2017-05-16 ENCOUNTER — Ambulatory Visit (HOSPITAL_COMMUNITY): Payer: Self-pay

## 2017-05-17 ENCOUNTER — Ambulatory Visit (INDEPENDENT_AMBULATORY_CARE_PROVIDER_SITE_OTHER): Payer: Medicare Other | Admitting: Licensed Clinical Social Worker

## 2017-05-17 ENCOUNTER — Other Ambulatory Visit (HOSPITAL_COMMUNITY): Payer: Medicare Other

## 2017-05-17 ENCOUNTER — Encounter (HOSPITAL_COMMUNITY): Payer: Self-pay | Admitting: Licensed Clinical Social Worker

## 2017-05-17 DIAGNOSIS — F440 Dissociative amnesia: Secondary | ICD-10-CM

## 2017-05-17 DIAGNOSIS — F4321 Adjustment disorder with depressed mood: Secondary | ICD-10-CM

## 2017-05-17 DIAGNOSIS — F3162 Bipolar disorder, current episode mixed, moderate: Secondary | ICD-10-CM

## 2017-05-17 DIAGNOSIS — F4310 Post-traumatic stress disorder, unspecified: Secondary | ICD-10-CM

## 2017-05-17 DIAGNOSIS — F4320 Adjustment disorder, unspecified: Secondary | ICD-10-CM

## 2017-05-17 NOTE — Progress Notes (Signed)
   THERAPIST PROGRESS NOTE  Session Time: 1:20pm-2:30pm  Participation Level: Active  Behavioral Response: NeatAlertDepressed  Type of Therapy: Individual Therapy  Treatment Goals addressed: improve psychiatric symptoms, Controlled Behavior, Moderated Mood, Deliberative Speech (improved social functioning), Improve Unhelpful Thought Patterns, Emotional Regulation Skills (Moderate moods, anger management, stress management), Feel and express a full Range of Emotions, Learn about Diagnosis, Healthy Coping Skills, Recall the Traumatic event without being overwhelmed    Interventions: CBT, Motivational Interviewing, grounding techniques and mindfulness techniques, and psychoeducation  Summary: Justin [name] is a 71 y.o. male who presents with Bipolar 1 Disorder, Mixed, Moderate and Dissociative Amnesia, Grief, and PTSD  Suicidal/Homicidal:  Increasing suicidal thoughts, but no intent.   Therapist Response:  Justin [name] met with clinician for an individual session. Justin [name] discussed his psychiatric symptoms, his current life events and his homework.Justin [name] shared that he had to put his dog to sleep on last Friday and since then he has had an increase in depressed mood, decrease in sleep, and some suicidal thoughts. Justin [name] reports no intent, but readiness for his pain to end and feeling so lonely and unimportant to others. Justin [name] reviewed long history of losses starting in earlier childhood and going through present experience with grief. Clinician identified the tendency toward remembering and re-experiencing losses from the past due to current loss.  Assessed for suicidality and identified thoughts, but no intent. Ensured that Justin [name] would contact [facility] if increase in thoughts. Justin [name] has an appointment with Dr. Adele Schilder in October. Justin [name] agreed to increase social interactions with natural supports. Justin [name] also will consider enrolling in IOP group or contacting Hospice for additional grief support.    Plan: Return again in 1 -2 weeks.  Diagnosis:     Axis I: Bipolar 1 Disorder, Mixed, Moderate and Dissociative Amnesia, Grief, and PTSD Justin [name] 05/17/2017

## 2017-05-18 ENCOUNTER — Ambulatory Visit (HOSPITAL_COMMUNITY): Payer: Self-pay

## 2017-05-18 ENCOUNTER — Other Ambulatory Visit (HOSPITAL_COMMUNITY): Payer: Medicare Other

## 2017-05-19 ENCOUNTER — Other Ambulatory Visit (HOSPITAL_COMMUNITY): Payer: Medicare Other

## 2017-05-22 ENCOUNTER — Other Ambulatory Visit (HOSPITAL_COMMUNITY): Payer: Medicare Other

## 2017-05-23 ENCOUNTER — Other Ambulatory Visit (HOSPITAL_COMMUNITY): Payer: Medicare Other

## 2017-05-24 ENCOUNTER — Other Ambulatory Visit (HOSPITAL_COMMUNITY): Payer: Medicare Other

## 2017-05-24 ENCOUNTER — Ambulatory Visit (HOSPITAL_COMMUNITY): Payer: Self-pay | Admitting: Licensed Clinical Social Worker

## 2017-05-25 ENCOUNTER — Other Ambulatory Visit: Payer: Self-pay | Admitting: Internal Medicine

## 2017-05-25 ENCOUNTER — Other Ambulatory Visit (HOSPITAL_COMMUNITY): Payer: Medicare Other

## 2017-05-26 ENCOUNTER — Other Ambulatory Visit (HOSPITAL_COMMUNITY): Payer: Medicare Other

## 2017-05-29 ENCOUNTER — Other Ambulatory Visit (HOSPITAL_COMMUNITY): Payer: Medicare Other

## 2017-05-30 ENCOUNTER — Other Ambulatory Visit (HOSPITAL_COMMUNITY): Payer: Medicare Other

## 2017-05-31 ENCOUNTER — Encounter (HOSPITAL_COMMUNITY): Payer: Self-pay | Admitting: Licensed Clinical Social Worker

## 2017-05-31 ENCOUNTER — Ambulatory Visit (INDEPENDENT_AMBULATORY_CARE_PROVIDER_SITE_OTHER): Payer: Medicare Other | Admitting: Licensed Clinical Social Worker

## 2017-05-31 DIAGNOSIS — F3162 Bipolar disorder, current episode mixed, moderate: Secondary | ICD-10-CM | POA: Diagnosis not present

## 2017-05-31 DIAGNOSIS — F440 Dissociative amnesia: Secondary | ICD-10-CM | POA: Diagnosis not present

## 2017-05-31 DIAGNOSIS — F4310 Post-traumatic stress disorder, unspecified: Secondary | ICD-10-CM

## 2017-05-31 DIAGNOSIS — Z634 Disappearance and death of family member: Secondary | ICD-10-CM | POA: Diagnosis not present

## 2017-05-31 NOTE — Progress Notes (Signed)
   THERAPIST PROGRESS NOTE  Session Time: 1:30pm-2:30pm  Participation Level: Active  Behavioral Response: NeatAlertDepressed  Type of Therapy: Individual Therapy  Treatment Goals addressed: improve psychiatric symptoms, Controlled Behavior, Moderated Mood, Deliberative Speech (improved social functioning), Improve Unhelpful Thought Patterns, Emotional Regulation Skills (Moderate moods, anger management, stress management), Feel and express a full Range of Emotions, Learn about Diagnosis, Healthy Coping Skills, Recall the Traumatic event without being overwhelmed    Interventions: CBT, Motivational Interviewing, grounding techniques and mindfulness techniques, and psychoeducation  Summary: Langley Ingalls is a 71 y.o. male who presents with Bipolar 1 Disorder, Mixed, Moderate and Dissociative Amnesia, Grief, and PTSD  Suicidal/Homicidal: No - without intent/plan  Therapist Response:  Hridhaan met with clinician for an individual session. Zachry discussed his psychiatric symptoms, his current life events and his homework. [name] reports ongoing severe depression due to the recent loss of his dog, as well as the loss of his wife almost 2 years ago. [name] processed his thoughts and feelings about grief, as well as his interactions with his children. [name] reports continuing strife between him and and children, which has now reduced their contact to holidays and birthdays. Clinician utilized MI reflections and affirmations in session to further the conversation about his emotional state. Clinician challenged [name] to identify his expectations of his family and his ability to reduce contact if expectations continue to not be met. Clinician explored options for getting away for a few days in order to change scenery. [name] agreed to think about a vacation to the beach in the coming weeks or month.  [name] reports increased mood swings over the past weekend and some fleeting suicidal thoughts.  [name] reports no  intent, but some thoughts about the meaning of his life and what his purpose is now.   Plan: Return again in 1 -2 weeks.  Diagnosis:     Axis I: Bipolar 1 Disorder, Mixed, Moderate and Dissociative Amnesia, Grief, and PTSD  [name], LCSW 05/31/2017

## 2017-06-05 ENCOUNTER — Ambulatory Visit (INDEPENDENT_AMBULATORY_CARE_PROVIDER_SITE_OTHER): Payer: Medicare Other | Admitting: Psychiatry

## 2017-06-05 ENCOUNTER — Encounter (HOSPITAL_COMMUNITY): Payer: Self-pay | Admitting: Psychiatry

## 2017-06-05 VITALS — BP 130/82 | HR 73 | Ht 69.5 in | Wt 170.0 lb

## 2017-06-05 DIAGNOSIS — R5383 Other fatigue: Secondary | ICD-10-CM | POA: Diagnosis not present

## 2017-06-05 DIAGNOSIS — F419 Anxiety disorder, unspecified: Secondary | ICD-10-CM | POA: Diagnosis not present

## 2017-06-05 DIAGNOSIS — R5381 Other malaise: Secondary | ICD-10-CM

## 2017-06-05 DIAGNOSIS — Z810 Family history of intellectual disabilities: Secondary | ICD-10-CM

## 2017-06-05 DIAGNOSIS — R4583 Excessive crying of child, adolescent or adult: Secondary | ICD-10-CM

## 2017-06-05 DIAGNOSIS — G4700 Insomnia, unspecified: Secondary | ICD-10-CM | POA: Diagnosis not present

## 2017-06-05 DIAGNOSIS — Z634 Disappearance and death of family member: Secondary | ICD-10-CM

## 2017-06-05 DIAGNOSIS — F319 Bipolar disorder, unspecified: Secondary | ICD-10-CM | POA: Diagnosis not present

## 2017-06-05 DIAGNOSIS — R450 Nervousness: Secondary | ICD-10-CM | POA: Diagnosis not present

## 2017-06-05 MED ORDER — TRAZODONE HCL 50 MG PO TABS: 50.0000 mg | ORAL_TABLET | Freq: Every day | ORAL | 1 refills | Status: DC

## 2017-06-05 MED ORDER — LORAZEPAM 0.5 MG PO TABS: 0.5000 mg | ORAL_TABLET | Freq: Two times a day (BID) | ORAL | 1 refills | Status: DC

## 2017-06-05 MED ORDER — LAMOTRIGINE 100 MG PO TABS: 250.0000 mg | ORAL_TABLET | Freq: Every day | ORAL | 2 refills | Status: DC

## 2017-06-05 MED ORDER — OLANZAPINE 15 MG PO TABS: 15.0000 mg | ORAL_TABLET | Freq: Every day | ORAL | 0 refills | Status: DC

## 2017-06-05 NOTE — Progress Notes (Signed)
[facility] MD/PA/NP OP Progress Note  06/05/2017 12:11 PM Justin [name]  MRN:  [id number]  Chief Complaint:  I am sad because I have to put my dog for sleep.  HPI: Justin [name] came for his follow-up appointment.  He's been feeling sad and depressed because he has to put his dog 2 weeks ago.  He is thinking about the dog all the time and he was very tearful and sad.  He started seeing a new therapist in this office and things started to get better until dog having health issues.  He also started seeing his son and had a dinner last night and hoping to go back again today.  He is happy that he was able to see his grand kids but it try to keep a distance from his daughter-in-law.  He is ruminating about his depression and had racing thoughts poor sleep and some time crying spells.  Denies any suicidal thoughts but sometime feeling hopeless helpless and anhedonia.  He endorse having hallucination off his deceased dog but he was able to push them away.  His energy level is fair.  He described his attention and concentration is also distracted sometimes.  His appetite is fair.  He denies any aggression, rage or any self abusive behavior.  He's taking Xanax, trazodone, Zyprexa and Lamictal.  He has no rash or itching.  He has no shakes or tremors.  He denies drinking alcohol or using any illegal substances.  He lives by himself but sometime he is very sad that he talks to his sister who lives close by.  Visit Diagnosis:    ICD-10-CM   1. Bipolar I disorder (HCC) F31.9 OLANZapine (ZYPREXA) 15 MG tablet    traZODone (DESYREL) 50 MG tablet    lamoTRIgine (LAMICTAL) 100 MG tablet    LORazepam (ATIVAN) 0.5 MG tablet    Past Psychiatric History: Reviewed. Patient has bipolar disorder with at least more than 15 psychiatric hospitalization. Most of psychiatric hospitalization was due to mania, depression, suicidal attempt. He has taken overdose on his psychiatric medication including Ambien, Vistaril and alcohol. His  last psychiatric hospitalization was in 2007 at [facility]. In the past he has taken Zyprexa, Cymbalta, Seroquel, Vistaril, Ambien, Neurontin, Wellbutrin, Xanax and Trileptal however he do not remember the details very well. He did remember lithium cause side effects. He was seeing Dr. Letta Moynahan and Parthenia Ames. Patient has history of mania, psychosis, hallucination and severe. He finished intensive outpatient program depression.  Past Medical History:  Past Medical History:  Diagnosis Date  . Anxiety   . Arthritis   . Asthma   . Bipolar disorder ([place])   . CAD S/P percutaneous coronary stenting   . CHF (congestive heart failure) ([place])   . Chicken pox   . Colon polyps   . Depression   . 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[place])   . Grief   . Hx of adenomatous colonic polyps 06/22/2016  . Hyperlipidemia   . Hypertension   . Migraines   . NSTEMI (non-ST elevated myocardial infarction) ([place])   . PTSD (post-traumatic stress disorder)     Past Surgical History:  Procedure Laterality Date  . CERVICAL DISCECTOMY    . COLONOSCOPY    . ELBOW SURGERY Left   . ELBOW SURGERY Right   . INGUINAL HERNIA REPAIR Bilateral    1996, 1997  . KNEE ARTHROSCOPY Right   . KNEE CARTILAGE SURGERY Left   . NASAL SEPTUM SURGERY    . STENT PLACEMENT VASCULAR (  [place] HX)  09/02/2010  . TONSILLECTOMY      Family Psychiatric History: Reviewed.  Family History:  Family History  Problem Relation Age of Onset  . Arthritis Mother   . Heart disease Mother   . Hypertension Mother   . Alzheimer's disease Mother   . Heart attack Mother   . Arthritis Father   . Hyperlipidemia Father   . Heart disease Father   . Stroke Father   . Hypertension Father   . Heart attack Father   . Multiple sclerosis Sister   . Diabetes Sister   . Lung cancer Paternal Grandmother   . Diabetes Sister   . Lung cancer Maternal Aunt   . Lung cancer Paternal Aunt     Social History:  Social History   Social  History  . Marital status: Widowed    Spouse name: N/A  . Number of children: 2  . Years of education: 12   Occupational History  . Retired    Social History Main Topics  . Smoking status: Never Smoker  . Smokeless tobacco: Never Used  . Alcohol use No  . Drug use: No  . Sexual activity: Not Currently   Other Topics Concern  . Not on file   Social History Narrative   Retired, widowed in 2017    1 son one daughter   2 caffeinated beverages daily no alcohol or tobacco   Fun: Work out in the yard.   Denies religious beliefs effecting healthcare.     Allergies:  Allergies  Allergen Reactions  . Ambien [Zolpidem Tartrate] Other (See Comments)    Blackout, memory issues    Metabolic Disorder Labs: No results found for: HGBA1C, MPG No results found for: PROLACTIN Lab Results  Component Value Date   CHOL 141 03/26/2015   TRIG 87.0 03/26/2015   HDL 68.50 03/26/2015   CHOLHDL 2 03/26/2015   VLDL 17.4 03/26/2015   LDLCALC 55 03/26/2015   Lab Results  Component Value Date   TSH 0.59 03/26/2015    Therapeutic Level Labs: No results found for: LITHIUM No results found for: VALPROATE No components found for:  CBMZ  Current Medications: Current Outpatient Prescriptions  Medication Sig Dispense Refill  . ALPRAZolam (XANAX) 0.5 MG tablet Take 1 tablet (0.5 mg total) by mouth 2 (two) times daily. 60 tablet 2  . CARTIA XT 180 MG 24 hr capsule     . clopidogrel (PLAVIX) 75 MG tablet Take 75 mg by mouth daily.    . cyclobenzaprine (FLEXERIL) 5 MG tablet TAKE 1 TABLET BY MOUTH THREE TIMES DAILY AS NEEDED FOR MUSCLE SPASMS 40 tablet 1  . cyclobenzaprine (FLEXERIL) 5 MG tablet TAKE 1 TABLET BY MOUTH THREE TIMES DAILY AS NEEDED FOR MUSCLE SPASMS 40 tablet 0  . lamoTRIgine (LAMICTAL) 100 MG tablet Take 2.5 tablets (250 mg total) by mouth daily. 75 tablet 2  . nitroGLYCERIN (NITROSTAT) 0.4 MG SL tablet     . OLANZapine (ZYPREXA) 10 MG tablet Take 1 tablet (10 mg total) by mouth  at bedtime. 90 tablet 0  . rosuvastatin (CRESTOR) 20 MG tablet Take 20 mg by mouth daily.    Marland Kitchen tiZANidine (ZANAFLEX) 4 MG tablet Take 1 tablet (4 mg total) by mouth every 6 (six) hours as needed for muscle spasms. 60 tablet 0  . traMADol (ULTRAM) 50 MG tablet Take 1 tablet (50 mg total) by mouth every 6 (six) hours as needed. 40 tablet 0  . traZODone (DESYREL) 50 MG tablet Take 1  tablet (50 mg total) by mouth at bedtime. 30 tablet 1   Current Facility-Administered Medications  Medication Dose Route Frequency Provider Last Rate Last Dose  . 0.9 %  sodium chloride infusion  500 mL Intravenous Continuous Gatha Mayer, MD         Musculoskeletal: Strength & Muscle Tone: within normal limits Gait & Station: normal Patient leans: N/A  Psychiatric Specialty Exam: Review of Systems  Constitutional: Positive for malaise/fatigue.  HENT: Negative.   Respiratory: Negative.   Cardiovascular: Negative.   Musculoskeletal: Positive for back pain.  Skin: Negative.  Negative for itching and rash.  Neurological: Negative.   Psychiatric/Behavioral: Positive for depression. The patient is nervous/anxious and has insomnia.     Blood pressure 130/82, pulse 73, height 5' 9.5" (1.765 m), weight 170 lb (77.1 kg).There is no height or weight on file to calculate BMI.  General Appearance: Casual  Eye Contact:  Fair  Speech:  Slow  Volume:  Decreased  Mood:  Anxious and Depressed  Affect:  Appropriate  Thought Process:  Goal Directed  Orientation:  Full (Time, Place, and Person)  Thought Content: Rumination   Suicidal Thoughts:  No  Homicidal Thoughts:  No  Memory:  Immediate;   Good Recent;   Good Remote;   Good  Judgement:  Good  Insight:  Good  Psychomotor Activity:  Decreased  Concentration:  Concentration: Fair and Attention Span: Fair  Recall:  Good  Fund of Knowledge: Good  Language: Good  Akathisia:  No  Handed:  Right  AIMS (if indicated): not done  Assets:  Communication  Skills Desire for Improvement Housing Physical Health  ADL's:  Intact  Cognition: WNL  Sleep:  Fair   Screenings: Mini-Mental     Office Visit from 03/26/2015 in [facility]  Total Score (max 30 points )  30    PHQ2-9     Office Visit from 06/09/2016 in [facility] from 01/13/2016 in [facility] from 03/26/2015 in [facility]  PHQ-2 Total Score  2  6  0  PHQ-9 Total Score  8  26  -       Assessment and Plan: Bipolar disorder type I.  Anxiety disorder NOS.  Patient continues to have ruminative thoughts and depression.  He is sad about losing his dog, he has to put his dog because she was not doing very well.  He's had anxiety and he does not feel Xanax working.  I recommended to try lorazepam 0.5 mg twice a day and he will stop the Xanax.  I also recommended to try Zyprexa 15 mg at bedtime to help insomnia and racing thoughts.  He will continue to take trazodone 50 mg as needed and Lamictal 250 mg daily.  He has no rash, itching, tremors or shakes.  Encouraged to keep appointment with Janett Billow for coping skills and CBT.  Recommended to call us back if he has any question or any concern.  Discuss safety concern that anytime having active suicidal thoughts or homicidal thoughts and he need to call 911 or go to local emergency room.  Follow-up in 2 months.   Yanixan Mellinger T., MD 06/05/2017, 12:11 PM

## 2017-06-07 ENCOUNTER — Ambulatory Visit (HOSPITAL_COMMUNITY): Payer: Self-pay | Admitting: Licensed Clinical Social Worker

## 2017-06-09 ENCOUNTER — Emergency Department (HOSPITAL_COMMUNITY): Payer: Medicare Other

## 2017-06-09 ENCOUNTER — Encounter (HOSPITAL_COMMUNITY): Payer: Self-pay | Admitting: Emergency Medicine

## 2017-06-09 ENCOUNTER — Emergency Department (HOSPITAL_COMMUNITY)
Admission: EM | Admit: 2017-06-09 | Discharge: 2017-06-12 | Disposition: A | Discharge status: Other Acute Inpatient Hospital | Payer: Medicare Other | Attending: Emergency Medicine | Admitting: Emergency Medicine

## 2017-06-09 DIAGNOSIS — Y929 Unspecified place or not applicable: Secondary | ICD-10-CM | POA: Diagnosis not present

## 2017-06-09 DIAGNOSIS — Y939 Activity, unspecified: Secondary | ICD-10-CM | POA: Insufficient documentation

## 2017-06-09 DIAGNOSIS — Z9114 Patient's other noncompliance with medication regimen: Secondary | ICD-10-CM | POA: Insufficient documentation

## 2017-06-09 DIAGNOSIS — S62001A Unspecified fracture of navicular [scaphoid] bone of right wrist, initial encounter for closed fracture: Secondary | ICD-10-CM | POA: Diagnosis not present

## 2017-06-09 DIAGNOSIS — F315 Bipolar disorder, current episode depressed, severe, with psychotic features: Secondary | ICD-10-CM | POA: Diagnosis not present

## 2017-06-09 DIAGNOSIS — I2510 Atherosclerotic heart disease of native coronary artery without angina pectoris: Secondary | ICD-10-CM | POA: Diagnosis not present

## 2017-06-09 DIAGNOSIS — S62002A Unspecified fracture of navicular [scaphoid] bone of left wrist, initial encounter for closed fracture: Secondary | ICD-10-CM

## 2017-06-09 DIAGNOSIS — F314 Bipolar disorder, current episode depressed, severe, without psychotic features: Secondary | ICD-10-CM

## 2017-06-09 DIAGNOSIS — Z79899 Other long term (current) drug therapy: Secondary | ICD-10-CM | POA: Insufficient documentation

## 2017-06-09 DIAGNOSIS — I509 Heart failure, unspecified: Secondary | ICD-10-CM | POA: Diagnosis not present

## 2017-06-09 DIAGNOSIS — R443 Hallucinations, unspecified: Secondary | ICD-10-CM | POA: Diagnosis present

## 2017-06-09 DIAGNOSIS — S301XXA Contusion of abdominal wall, initial encounter: Secondary | ICD-10-CM | POA: Diagnosis not present

## 2017-06-09 DIAGNOSIS — F3132 Bipolar disorder, current episode depressed, moderate: Secondary | ICD-10-CM | POA: Diagnosis present

## 2017-06-09 DIAGNOSIS — X58XXXA Exposure to other specified factors, initial encounter: Secondary | ICD-10-CM | POA: Insufficient documentation

## 2017-06-09 DIAGNOSIS — F411 Generalized anxiety disorder: Secondary | ICD-10-CM | POA: Diagnosis present

## 2017-06-09 DIAGNOSIS — I110 Hypertensive heart disease with heart failure: Secondary | ICD-10-CM | POA: Insufficient documentation

## 2017-06-09 DIAGNOSIS — F13239 Sedative, hypnotic or anxiolytic dependence with withdrawal, unspecified: Secondary | ICD-10-CM | POA: Diagnosis present

## 2017-06-09 DIAGNOSIS — Z7902 Long term (current) use of antithrombotics/antiplatelets: Secondary | ICD-10-CM | POA: Diagnosis not present

## 2017-06-09 DIAGNOSIS — R413 Other amnesia: Secondary | ICD-10-CM | POA: Diagnosis present

## 2017-06-09 DIAGNOSIS — Y999 Unspecified external cause status: Secondary | ICD-10-CM | POA: Insufficient documentation

## 2017-06-09 DIAGNOSIS — Z01818 Encounter for other preprocedural examination: Secondary | ICD-10-CM

## 2017-06-09 DIAGNOSIS — S7002XA Contusion of left hip, initial encounter: Secondary | ICD-10-CM | POA: Insufficient documentation

## 2017-06-09 DIAGNOSIS — F13939 Sedative, hypnotic or anxiolytic use, unspecified with withdrawal, unspecified: Secondary | ICD-10-CM | POA: Diagnosis present

## 2017-06-09 LAB — CBC
HEMATOCRIT: 34.8 % — AB (ref 39.0–52.0)
Hemoglobin: 11.6 g/dL — ABNORMAL LOW (ref 13.0–17.0)
MCH: 31.5 pg (ref 26.0–34.0)
MCHC: 33.3 g/dL (ref 30.0–36.0)
MCV: 94.6 fL (ref 78.0–100.0)
PLATELETS: 231 10*3/uL (ref 150–400)
RBC: 3.68 MIL/uL — AB (ref 4.22–5.81)
RDW: 13 % (ref 11.5–15.5)
WBC: 8.3 10*3/uL (ref 4.0–10.5)

## 2017-06-09 LAB — COMPREHENSIVE METABOLIC PANEL
ALT: 35 U/L (ref 17–63)
ANION GAP: 10 (ref 5–15)
AST: 63 U/L — ABNORMAL HIGH (ref 15–41)
Albumin: 4.1 g/dL (ref 3.5–5.0)
Alkaline Phosphatase: 63 U/L (ref 38–126)
BUN: 15 mg/dL (ref 6–20)
CHLORIDE: 101 mmol/L (ref 101–111)
CO2: 22 mmol/L (ref 22–32)
CREATININE: 0.88 mg/dL (ref 0.61–1.24)
Calcium: 9.1 mg/dL (ref 8.9–10.3)
Glucose, Bld: 139 mg/dL — ABNORMAL HIGH (ref 65–99)
Potassium: 3.7 mmol/L (ref 3.5–5.1)
Sodium: 133 mmol/L — ABNORMAL LOW (ref 135–145)
Total Bilirubin: 1.1 mg/dL (ref 0.3–1.2)
Total Protein: 6.8 g/dL (ref 6.5–8.1)

## 2017-06-09 LAB — RAPID URINE DRUG SCREEN, HOSP PERFORMED
Amphetamines: NOT DETECTED
Barbiturates: NOT DETECTED
Benzodiazepines: POSITIVE — AB
Cocaine: NOT DETECTED
OPIATES: NOT DETECTED
Tetrahydrocannabinol: NOT DETECTED

## 2017-06-09 LAB — ETHANOL

## 2017-06-09 MED ORDER — ALUM & MAG HYDROXIDE-SIMETH 200-200-20 MG/5ML PO SUSP: 30.0000 mL | Freq: Four times a day (QID) | ORAL | Status: DC | PRN

## 2017-06-09 MED ORDER — CLOPIDOGREL BISULFATE 75 MG PO TABS
75.0000 mg | ORAL_TABLET | Freq: Every day | ORAL | Status: DC
  Administered 2017-06-10 – 2017-06-12 (×3): 75 mg via ORAL
  Filled 2017-06-09 (×3): qty 1

## 2017-06-09 MED ORDER — ACETAMINOPHEN 325 MG PO TABS
650.0000 mg | ORAL_TABLET | ORAL | Status: DC | PRN
  Administered 2017-06-09 – 2017-06-12 (×6): 650 mg via ORAL
  Filled 2017-06-09 (×6): qty 2

## 2017-06-09 MED ORDER — IOPAMIDOL (ISOVUE-300) INJECTION 61%
INTRAVENOUS | Status: AC
  Administered 2017-06-09: 100 mL
  Filled 2017-06-09: qty 100

## 2017-06-09 MED ORDER — NICOTINE 21 MG/24HR TD PT24
21.0000 mg | MEDICATED_PATCH | Freq: Every day | TRANSDERMAL | Status: DC
  Filled 2017-06-09: qty 1

## 2017-06-09 MED ORDER — LAMOTRIGINE 25 MG PO TABS
250.0000 mg | ORAL_TABLET | Freq: Every day | ORAL | Status: DC
  Administered 2017-06-09 – 2017-06-12 (×4): 250 mg via ORAL
  Filled 2017-06-09 (×5): qty 2

## 2017-06-09 MED ORDER — OLANZAPINE 10 MG PO TABS
10.0000 mg | ORAL_TABLET | Freq: Every day | ORAL | Status: DC
  Administered 2017-06-09 – 2017-06-11 (×3): 10 mg via ORAL
  Filled 2017-06-09 (×3): qty 1

## 2017-06-09 MED ORDER — DILTIAZEM HCL ER COATED BEADS 180 MG PO CP24
180.0000 mg | ORAL_CAPSULE | Freq: Every day | ORAL | Status: DC
  Administered 2017-06-10 – 2017-06-12 (×3): 180 mg via ORAL
  Filled 2017-06-09 (×3): qty 1

## 2017-06-09 NOTE — BH Assessment (Addendum)
Assessment Note  Justin [name] is an 71 y.o. male who presents for increasing suicidal ideation without a plan following the recent passing of his dog. Patient additionally notes recent increasing forgetfulness, lack of sleep, and building feelings of stress and sadness that he attributes to him feeling suicidal. Patient points out that after his son found out that he had had so little sleep that if he did not check himself into the hospital that he would involuntarily commit him. Patient does verbalize that he has emotional support from his younger sister who lives here in [place]. Patient is also seeing Arfeen MD for out-paitent psychiatry and has prior diagnosis of Bipolar I, and insomnia that he feels are well managed with medication until this recent increasing episode. His appetite is fair. He denies any aggression, rage or any self abusive behavior. He's taking Xanax, trazodone, Zyprexa and Lamictal. He denies drinking alcohol or using any illegal substances. He verbalizes that his goal for this admission would be to help him get some rest and decrease his recently increasing feelings of depression and sadness. Patient is oriented to place only and denies any H/I or AVH. Case was staffed with Reita Cliche DNP who recommended patient be observed and monitored for safety.   Diagnosis: Bipolar 1 most recent episode depressed,severe  Past Medical History:  Past Medical History:  Diagnosis Date  . Anxiety   . Arthritis   . Asthma   . Bipolar disorder ([place])   . CAD S/P percutaneous coronary stenting   . CHF (congestive heart failure) ([place])   . Chicken pox   . Colon polyps   . Depression   . 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 Grief   . Hx of adenomatous colonic polyps 06/22/2016  . Hyperlipidemia   . Hypertension   . Migraines   . NSTEMI (non-ST elevated myocardial infarction) ([place])   . PTSD (post-traumatic stress disorder)     Past Surgical History:  Procedure Laterality Date  . CERVICAL  DISCECTOMY    . COLONOSCOPY    . ELBOW SURGERY Left   . ELBOW SURGERY Right   . INGUINAL HERNIA REPAIR Bilateral    1996, 1997  . KNEE ARTHROSCOPY Right   . KNEE CARTILAGE SURGERY Left   . NASAL SEPTUM SURGERY    . STENT PLACEMENT VASCULAR ([place] [facility]X)  09/02/2010  . TONSILLECTOMY      Family History:  Family History  Problem Relation Age of Onset  . Arthritis Mother   . Heart disease Mother   . Hypertension Mother   . Alzheimer's disease Mother   . Heart attack Mother   . Arthritis Father   . Hyperlipidemia Father   . Heart disease Father   . Stroke Father   . Hypertension Father   . Heart attack Father   . Multiple sclerosis Sister   . Diabetes Sister   . Lung cancer Paternal Grandmother   . Diabetes Sister   . Lung cancer Maternal Aunt   . Lung cancer Paternal Aunt     Social History:  reports that he has never smoked. He has never used smokeless tobacco. He reports that he does not drink alcohol or use drugs.  Additional Social History:  Alcohol / Drug Use Pain Medications: See MAR Prescriptions: See MAR Over the Counter: See MAR History of alcohol / drug use?: No history of alcohol / drug abuse Longest period of sobriety (when/how long):  (Denies) Negative Consequences of Use:  (Denies) Withdrawal Symptoms:  (Denies)  CIWA:  CIWA-Ar BP: (!) 145/83 Pulse Rate: (!) 126 COWS:    Allergies:  Allergies  Allergen Reactions  . Ambien [Zolpidem Tartrate] Other (See Comments)    Blackout, memory issues    Home Medications:  (Not in a hospital admission)  OB/GYN Status:  No LMP for male patient.  General Assessment Data [facility] of Assessment: [facility] TTS Assessment: In system Is this a Tele or Face-to-Face Assessment?: Face-to-Face Is this an Initial Assessment or a Re-assessment for this encounter?: Initial Assessment Marital status: Widowed [place] name: NA Is patient pregnant?: No Pregnancy Status: No Living Arrangements: Alone Can pt return to current  living arrangement?: Yes Admission Status: Voluntary Is patient capable of signing voluntary admission?: Yes Referral Source: Self/Family/Friend  Medical Screening Exam [facility] Walk-in ONLY) Medical Exam completed: Yes  Crisis Care Plan Living Arrangements: Alone Legal Guardian: Other: (NA) Name of Psychiatrist: Arfeen MD Name of Therapist: None  Education Status Is patient currently in school?: No Current Grade:  (NA) Highest grade of school patient has completed:  (12) Name of school:  (NA) Contact person:  (NA)  Risk to self with the past 6 months Suicidal Ideation: Yes-Currently Present Has patient been a risk to self within the past 6 months prior to admission? : Yes Suicidal Intent: No Has patient had any suicidal intent within the past 6 months prior to admission? : No Is patient at risk for suicide?: Yes Suicidal Plan?: No Has patient had any suicidal plan within the past 6 months prior to admission? : No Access to Means: No What has been your use of drugs/alcohol within the last 12 months?: Denies Previous Attempts/Gestures: Yes How many times?: 2 Other Self Harm Risks:  (NA) Triggers for Past Attempts:  (Deaths in family) Intentional Self Injurious Behavior: None Family Suicide History: No Recent stressful life event(s): Other (Comment) (Dog passed away) Persecutory voices/beliefs?: No Depression: Yes Depression Symptoms: Insomnia, Fatigue, Guilt (Loss of interest in life) Substance abuse history and/or treatment for substance abuse?: No Suicide prevention information given to non-admitted patients: Not applicable  Risk to Others within the past 6 months Homicidal Ideation: No Does patient have any lifetime risk of violence toward others beyond the six months prior to admission? : No Thoughts of Harm to Others: No Current Homicidal Intent: No Current Homicidal Plan: No Access to Homicidal Means: No Identified Victim: NA History of harm to others?:  No Assessment of Violence: None Noted Violent Behavior Description: NA Does patient have access to weapons?: No Criminal Charges Pending?: No Does patient have a court date: No Is patient on probation?: No  Psychosis Hallucinations: None noted Delusions: None noted  Mental Status Report Appearance/Hygiene: In scrubs Eye Contact: Fair Motor Activity: Freedom of movement Speech: Logical/coherent Level of Consciousness: Alert Mood: Depressed Affect: Depressed (Tearful) Anxiety Level: Minimal Thought Processes: Circumstantial Judgement: Partial Orientation: Place Obsessive Compulsive Thoughts/Behaviors: None  Cognitive Functioning Concentration: Decreased Memory: Recent Impaired IQ: Average Insight: Poor Impulse Control: Poor Appetite: Fair Weight Loss: 0 Weight Gain: 0 Sleep: Decreased Total Hours of Sleep: 5 Vegetative Symptoms: None  ADLScreening [facility] Assessment Services) Patient's cognitive ability adequate to safely complete daily activities?: Yes Patient able to express need for assistance with ADLs?: Yes Independently performs ADLs?: Yes (appropriate for developmental age)  Prior Inpatient Therapy Prior Inpatient Therapy: No Prior Therapy Dates: NA Prior Therapy Facilty/Provider(s): NA Reason for Treatment: NA  Prior Outpatient Therapy Prior Outpatient Therapy: Yes Prior Therapy Dates: Ongoing Prior Therapy Facilty/Provider(s): Arfeen MD Reason for Treatment: Med mang Does  patient have an ACCT team?: No Does patient have Intensive In-House Services?  : No Does patient have [facility] services? : No Does patient have P4CC services?: No  ADL Screening (condition at time of admission) Patient's cognitive ability adequate to safely complete daily activities?: Yes Does the patient have difficulty seeing, even when wearing glasses/contacts?: No Does the patient have difficulty concentrating, remembering, or making decisions?: No Patient able to express need  for assistance with ADLs?: Yes Does the patient have difficulty dressing or bathing?: No Independently performs ADLs?: Yes (appropriate for developmental age) Does the patient have difficulty walking or climbing stairs?: No Weakness of Legs: None Weakness of Arms/Hands: None  Home Assistive Devices/Equipment Home Assistive Devices/Equipment: None  Therapy Consults (therapy consults require a physician order) PT Evaluation Needed: No OT Evalulation Needed: No SLP Evaluation Needed: No Abuse/Neglect Assessment (Assessment to be complete while patient is alone) Physical Abuse: Denies Verbal Abuse: Denies Sexual Abuse: Denies Exploitation of patient/patient's resources: Denies Self-Neglect: Denies Values / Beliefs Cultural Requests During Hospitalization: None Spiritual Requests During Hospitalization: None Consults Spiritual Care Consult Needed: No Social Work Consult Needed: No Regulatory affairs officer (For Healthcare) Does Patient Have a Medical Advance Directive?: No Would patient like information on creating a medical advance directive?: No - Patient declined    Additional Information 1:1 In Past 12 Months?: No CIRT Risk: No Elopement Risk: No Does patient have medical clearance?: Yes      Disposition: Case was staffed with Reita Cliche DNP who recommended patient be observed and monitored for safety.    Disposition Initial Assessment Completed for this Encounter: Yes Disposition of Patient: Other dispositions Other disposition(s): Other (Comment) (Monitor for safety and observation)  On Site Evaluation by:   Reviewed with Physician:    Mamie Nick 06/09/2017 2:01 PM

## 2017-06-09 NOTE — BH Assessment (Signed)
[facility] Assessment Progress Note  Case was staffed with Reita Cliche DNP who recommended patient be observed and monitored for safety. Patient will be seen by psychiatry to further evaluate in the a.m.

## 2017-06-09 NOTE — ED Triage Notes (Signed)
Per PTAR-patient has a history of abusing cyclobenzapr-has not been taking his Seroquel-states having visual hallucinations-family advised patient to seek help

## 2017-06-09 NOTE — ED Provider Notes (Signed)
[facility] DEPT Provider Note   CSN: [id number] Arrival date & time: 06/09/17  1133     History   Chief Complaint Chief Complaint  Patient presents with  . Hallucinations    HPI Justin [name] is a 71 y.o. male.  HPI   71 year old male with history of bipolar, depression, CAD, CHF, anxiety, PTSD brought here via EMS from home for evaluation of medication noncompliant and having visual hallucination according to family members. History is limited as patient is a poor historian. It is difficult to understand his speech. patient does have ruminating thoughts of depression after the passing of his wife in March. He admits that he is sad. He also reported recently his dog passed away which makes inset as well. He however denies having any active SI or HI. He states he is compliant with his medication. He does admits to eating and sleeping less. He denies having auditory or visual hallucination. However it is once again difficult to fully understand patient during my interview. Denies having any active pain.He denies self medicating with alcohol or drugs.  Past Medical History:  Diagnosis Date  . Anxiety   . Arthritis   . Asthma   . Bipolar disorder ([place])   . CAD S/P percutaneous coronary stenting   . CHF (congestive heart failure) ([place])   . Chicken pox   . Colon polyps   . Depression   . 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 Grief   . Hx of adenomatous colonic polyps 06/22/2016  . Hyperlipidemia   . Hypertension   . Migraines   . NSTEMI (non-ST elevated myocardial infarction) ([place])   . PTSD (post-traumatic stress disorder)     Patient Active Problem List   Diagnosis Date Noted  . Bipolar 1 disorder, depressed, moderate (HCC) 04/05/2017    Class: Chronic  . Nail lesion 02/25/2017  . Hx of adenomatous colonic polyps 06/22/2016  . Memory changes 06/09/2016  . CAD S/P percutaneous coronary stenting   . Right knee pain 01/01/2016  . Olecranon  bursitis of right elbow 12/10/2015  . Bipolar I disorder ([place]) 06/26/2015  . Routine general medical examination at a health care facility 03/26/2015  . Medicare annual wellness visit, subsequent 03/26/2015  . Rash and nonspecific skin eruption 03/13/2015  . Generalized anxiety disorder 12/23/2014  . Low back pain 12/23/2014  . Essential hypertension 12/23/2014    Past Surgical History:  Procedure Laterality Date  . CERVICAL DISCECTOMY    . COLONOSCOPY    . ELBOW SURGERY Left   . ELBOW SURGERY Right   . INGUINAL HERNIA REPAIR Bilateral    1996, 1997  . KNEE ARTHROSCOPY Right   . KNEE CARTILAGE SURGERY Left   . NASAL SEPTUM SURGERY    . STENT PLACEMENT VASCULAR ([place] [facility]X)  09/02/2010  . TONSILLECTOMY         Home Medications    Prior to Admission medications   Medication Sig Start Date End Date Taking? Authorizing Provider  CARTIA XT 180 MG 24 hr capsule  11/14/15   [provider]  clopidogrel (PLAVIX) 75 MG tablet Take 75 mg by mouth daily.    [provider]  lamoTRIgine (LAMICTAL) 100 MG tablet Take 2.5 tablets (250 mg total) by mouth daily. 06/05/17   Arfeen, Arlyce Harman, MD  LORazepam (ATIVAN) 0.5 MG tablet Take 1 tablet (0.5 mg total) by mouth 2 (two) times daily. 06/05/17 06/05/18  Arfeen, Arlyce Harman, MD  nitroGLYCERIN (NITROSTAT) 0.4 MG SL tablet  01/07/16   [provider]  OLANZapine (ZYPREXA) 15 MG tablet Take 1 tablet (15 mg total) by mouth at bedtime. 06/05/17 06/05/18  Arfeen, Arlyce Harman, MD  rosuvastatin (CRESTOR) 20 MG tablet Take 20 mg by mouth daily.    [provider]  tiZANidine (ZANAFLEX) 4 MG tablet Take 1 tablet (4 mg total) by mouth every 6 (six) hours as needed for muscle spasms. 10/10/16   [place], FNP  traMADol (ULTRAM) 50 MG tablet Take 1 tablet (50 mg total) by mouth every 6 (six) hours as needed. 02/24/17   Biagio Borg, MD  traZODone (DESYREL) 50 MG tablet Take 1 tablet (50 mg total) by mouth at bedtime. 06/05/17    Kathlee Nations, MD    Family History Family History  Problem Relation Age of Onset  . Arthritis Mother   . Heart disease Mother   . Hypertension Mother   . Alzheimer's disease Mother   . Heart attack Mother   . Arthritis Father   . Hyperlipidemia Father   . Heart disease Father   . Stroke Father   . Hypertension Father   . Heart attack Father   . Multiple sclerosis Sister   . Diabetes Sister   . Lung cancer Paternal Grandmother   . Diabetes Sister   . Lung cancer Maternal Aunt   . Lung cancer Paternal Aunt     Social History Social History  Substance Use Topics  . Smoking status: Never Smoker  . Smokeless tobacco: Never Used  . Alcohol use No     Allergies   Ambien [zolpidem tartrate]   Review of Systems Review of Systems  Unable to perform ROS: Psychiatric disorder     Physical Exam Updated Vital Signs BP (!) 145/83 (BP Location: Left Arm)   Pulse (!) 126   Temp 98.3 F (36.8 C) (Oral)   Resp 18   SpO2 100%   Physical Exam  Constitutional: He is oriented to person, place, and time. He appears well-developed and well-nourished. No distress.   elderly male laying in bed nontoxic in appearance  HENT:  Head: Atraumatic.  edentulous  Eyes: Conjunctivae are normal.  Neck: Neck supple.  Cardiovascular: Normal rate and regular rhythm.   Pulmonary/Chest: Effort normal and breath sounds normal.  Abdominal: Soft. Bowel sounds are normal. He exhibits no distension. There is tenderness (tenderness to L lateral abdomen with a moderate size ecchymosis).  Musculoskeletal: He exhibits tenderness (L hip: tenderness to latera hip with bruising noted.  FROM, no deformity).  R wrist: tenderness throughout wrist with normal flexion/extension/supination/pronation, radial pulse 2+.  Normal grip strength  Neurological: He is alert and oriented to person, place, and time. GCS eye subscore is 4. GCS verbal subscore is 5. GCS motor subscore is 6.  Skin: No rash noted.    Psychiatric: He has a normal mood and affect.  Patient appeared withdrawn, speech difficult understanding and tangential, no homicidal or suicidal.  Nursing note and vitals reviewed.    ED Treatments / Results  Labs (all labs ordered are listed, but only abnormal results are displayed) Labs Reviewed  COMPREHENSIVE METABOLIC PANEL - Abnormal; Notable for the following:       Result Value   Sodium 133 (*)    Glucose, Bld 139 (*)    AST 63 (*)    All other components within normal limits  CBC - Abnormal; Notable for the following:    RBC 3.68 (*)    Hemoglobin 11.6 (*)  HCT 34.8 (*)    All other components within normal limits  RAPID URINE DRUG SCREEN, HOSP PERFORMED - Abnormal; Notable for the following:    Benzodiazepines POSITIVE (*)    All other components within normal limits  ETHANOL    EKG  EKG Interpretation None       Radiology No results found.  Procedures Procedures (including critical care time)  Medications Ordered in ED Medications  acetaminophen (TYLENOL) tablet 650 mg (not administered)  alum & mag hydroxide-simeth (MAALOX/MYLANTA) 200-200-20 MG/5ML suspension 30 mL (not administered)  nicotine (NICODERM CQ - dosed in mg/24 hours) patch 21 mg (21 mg Transdermal Refused 06/09/17 1254)     Initial Impression / Assessment and Plan / ED Course  I have reviewed the triage vital signs and the nursing notes.  Pertinent labs & imaging results that were available during my care of the patient were reviewed by me and considered in my medical decision making (see chart for details).     BP 127/71 (BP Location: Left Arm)   Pulse 89   Temp 98.4 F (36.9 C) (Oral)   Resp 16   SpO2 100%    Final Clinical Impressions(s) / ED Diagnoses   Final diagnoses:  Unspecified fracture of navicular (scaphoid) bone of left wrist, initial encounter for closed fracture  Hallucinations  Traumatic ecchymosis of abdominal wall, initial encounter  Traumatic  ecchymosis of left hip, initial encounter    New Prescriptions New Prescriptions   No medications on file   12:39 PM Patient sent here by family member for evaluation of abusing of cyclobenzaprine and not taking his Seroquel, as well as having visual hallucination. Workup initiated.  2:24 PM Pt has bruising to L lateral hip, L abdomen and pain to R wrist.  Report fallen recently but unable to tell me when.  Will obtain appropriate imaging.  He is on Plavix.  Care discussed with Dr. Gilford Raid.   2:57 PM Pt will benefit from TTS evaluation and Psychiatric assessment once he is medically cleared.  Pt currently awaiting appropriate xrays and CT scan for further assessment of his injury.    4:22 PM Xray of R wrist demonstrates a nondisplaced fracture of the navicular that cannot be excluded. Since pt has pain to affected area, will place a radial gutter splint for protection.  Pt will need to f/u with hand specialist for further care. Xray of L hip is without acute osseous abnormality.  Pt currently await abd/pelvis CT scan for further assessment of his bruises. Pt care sign out to oncoming provider.  If CT of abd/pelvis is negative, then pt is medically cleared for further psychiatric assessment.     Domenic Moras, PA-C 06/09/17 [place], Julie, MD 06/10/17 [phone]

## 2017-06-09 NOTE — ED Notes (Signed)
Off floor for testing 

## 2017-06-09 NOTE — ED Notes (Signed)
Bed: PP29 Expected date: 06/09/17 Expected time:  Means of arrival:  Comments:

## 2017-06-09 NOTE — ED Notes (Signed)
[facility]-[phone] POA NEXT OF KIN

## 2017-06-10 DIAGNOSIS — F13239 Sedative, hypnotic or anxiolytic dependence with withdrawal, unspecified: Secondary | ICD-10-CM | POA: Diagnosis present

## 2017-06-10 DIAGNOSIS — F13939 Sedative, hypnotic or anxiolytic use, unspecified with withdrawal, unspecified: Secondary | ICD-10-CM | POA: Diagnosis present

## 2017-06-10 DIAGNOSIS — F3132 Bipolar disorder, current episode depressed, moderate: Secondary | ICD-10-CM

## 2017-06-10 MED ORDER — LORAZEPAM 1 MG PO TABS: 1.0000 mg | ORAL_TABLET | Freq: Four times a day (QID) | ORAL | Status: DC | PRN

## 2017-06-10 MED ORDER — ADULT MULTIVITAMIN W/MINERALS CH
1.0000 | ORAL_TABLET | Freq: Every day | ORAL | Status: DC
  Administered 2017-06-10 – 2017-06-12 (×3): 1 via ORAL
  Filled 2017-06-10 (×3): qty 1

## 2017-06-10 MED ORDER — LORAZEPAM 1 MG PO TABS: 1.0000 mg | ORAL_TABLET | Freq: Every day | ORAL | Status: DC

## 2017-06-10 MED ORDER — LORAZEPAM 1 MG PO TABS
1.0000 mg | ORAL_TABLET | Freq: Two times a day (BID) | ORAL | Status: DC
  Administered 2017-06-12: 1 mg via ORAL
  Filled 2017-06-10: qty 1

## 2017-06-10 MED ORDER — LORAZEPAM 1 MG PO TABS
1.0000 mg | ORAL_TABLET | Freq: Three times a day (TID) | ORAL | Status: AC
  Administered 2017-06-11 (×3): 1 mg via ORAL
  Filled 2017-06-10 (×3): qty 1

## 2017-06-10 MED ORDER — HYDROXYZINE HCL 25 MG PO TABS
25.0000 mg | ORAL_TABLET | Freq: Four times a day (QID) | ORAL | Status: DC | PRN
  Filled 2017-06-10: qty 1

## 2017-06-10 MED ORDER — THIAMINE HCL 100 MG/ML IJ SOLN: 100.0000 mg | Freq: Once | INTRAMUSCULAR | Status: DC

## 2017-06-10 MED ORDER — LOPERAMIDE HCL 2 MG PO CAPS: 2.0000 mg | ORAL_CAPSULE | ORAL | Status: DC | PRN

## 2017-06-10 MED ORDER — ONDANSETRON 4 MG PO TBDP: 4.0000 mg | ORAL_TABLET | Freq: Four times a day (QID) | ORAL | Status: DC | PRN

## 2017-06-10 MED ORDER — VITAMIN B-1 100 MG PO TABS
100.0000 mg | ORAL_TABLET | Freq: Every day | ORAL | Status: DC
  Administered 2017-06-10 – 2017-06-12 (×3): 100 mg via ORAL
  Filled 2017-06-10 (×3): qty 1

## 2017-06-10 MED ORDER — LORAZEPAM 1 MG PO TABS
1.0000 mg | ORAL_TABLET | Freq: Four times a day (QID) | ORAL | Status: AC
  Administered 2017-06-10 (×4): 1 mg via ORAL
  Filled 2017-06-10 (×5): qty 1

## 2017-06-10 NOTE — ED Notes (Addendum)
Dr Kym Groom and Theodoro Clock DNP into see.  Pt reports that he is feeling some better, but is still depressed.  Pt denies current SI.  Pt reports that he saw Dr Herby Abraham last week and he changed him from xanax to klonipine.  Pt also with large bruise lt hip/flank area.  Pt also reports that he had difficulty sleeping last night and woke up sweating.

## 2017-06-10 NOTE — ED Notes (Signed)
Pt ambulatory w/o difficulty to room 42

## 2017-06-10 NOTE — ED Notes (Signed)
SBAR Report received from previous nurse. Pt received calm and visible on unit. Pt denies current SI/ HI, A/V H, anxiety, or pain at this time, and appears otherwise stable and free of distress but endorses sadness and depression. Pt reminded of camera surveillance, q 15 min rounds, and rules of the milieu. Will continue to assess.

## 2017-06-10 NOTE — ED Notes (Signed)
ICE pk for arm given

## 2017-06-10 NOTE — ED Notes (Signed)
Pt sister in to see

## 2017-06-10 NOTE — ED Notes (Addendum)
Pt reports that he does not know how he fell, that he was OK then he took some medication ?klonipine and does not know what happened after that.

## 2017-06-10 NOTE — Consult Note (Signed)
[place] Psychiatry Consult   Reason for Consult:   1. Suicidal ideation  2. Memory changes 3. Bipolar with depression Referring Physician:  EDP  Patient Identification: Justin [name] MRN:  [id number] Principal Diagnosis: bipolar affective disorder, depressed, severe without psychosis Diagnosis:   Patient Active Problem List   Diagnosis Date Noted  . Benzodiazepine withdrawal with complication [facility]) [J82.505] 06/10/2017    Priority: High  . Bipolar 1 disorder, depressed, moderate ([place]) [F31.32] 04/05/2017    Priority: High    Class: Chronic  . Generalized anxiety disorder [F41.1] 12/23/2014    Priority: High  . Memory changes [R41.3] 06/09/2016    Priority: Medium  . Nail lesion [L60.9] 02/25/2017  . Hx of adenomatous colonic polyps [Z86.010] 06/22/2016  . CAD S/P percutaneous coronary stenting [I25.10, Z98.61]   . Right knee pain [M25.561] 01/01/2016  . Olecranon bursitis of right elbow [M70.21] 12/10/2015  . Bipolar I disorder ([place]) [F31.9] 06/26/2015  . Routine general medical examination at a health care facility [Z00.00] 03/26/2015  . Medicare annual wellness visit, subsequent [Z00.00] 03/26/2015  . Rash and nonspecific skin eruption [R21] 03/13/2015  . Low back pain [M54.5] 12/23/2014  . Essential hypertension [I10] 12/23/2014    Total Time spent with patient: 45 minutes  Subjective:   Justin [name] is a 71 y.o. male patient admitted to the ER following recent increasing insomnia and noted memory changes, increase in depression with suicidal ideations and plan to overdose or shoot himself.  HPI:  Patient is a 71 year old Caucasian Male who presents to ER after family had noted increasing memory changes after patient had stopped his Klonopin or Xanax. Patient is very unclear in his verbalization regarding when this medication was stopped but reported it was changed from Xanax to Klonopin recently, not sure if he was accidentally overtaking it. Reports awakening  in a cold sweat today--Ativan alcohol detox protocol started.  Patient states that he had become increasingly sad and depressed at home that he contributes to his recent insomnia. Patient and his sister who is also present in the room at time of interview note that patients son stated that if the patient did not get evaluated in the ER for these changes that he would involuntarily commit him.  Patient sees outpatient psychiatry Dr. Adele Schilder. Patient currently admits he is high risk for suicide as he does not have anyone to check on him regularly, he is very depressed with the recent loss of his dog 2 weeks ago and the loss of his wife 1.5 years ago. Patient denies homicidal ideation. Does not feel safe at home. Patient requesting help to feel better with current life circumstances.   Past Psychiatric History:  2 prior suicide attempts Currently being seen out patient by Dr. Adele Schilder. Bipolar disorder   Risk to Self: Suicidal Ideation: Yes-Currently Present Suicidal Intent: No Is patient at risk for suicide?: Yes Suicidal Plan?: No Access to Means: No What has been your use of drugs/alcohol within the last 12 months?: Denies How many times?: 2 Other Self Harm Risks:  (NA) Triggers for Past Attempts:  (Deaths in family) Intentional Self Injurious Behavior: None Risk to Others: Homicidal Ideation: No Thoughts of Harm to Others: No Current Homicidal Intent: No Current Homicidal Plan: No Access to Homicidal Means: No Identified Victim: NA History of harm to others?: No Assessment of Violence: None Noted Violent Behavior Description: NA Does patient have access to weapons?: No Criminal Charges Pending?: No Does patient have a court date: No  Prior Inpatient Therapy: Prior Inpatient Therapy: No Prior Therapy Dates: NA Prior Therapy Facilty/Provider(s): NA Reason for Treatment: NA Prior Outpatient Therapy: Prior Outpatient Therapy: Yes Prior Therapy Dates: Ongoing Prior Therapy  Facilty/Provider(s): [facility] MD Reason for Treatment: [facility] Does patient have an ACCT team?: No Does patient have Intensive In-House Services?  : No Does patient have [facility] services? : No Does patient have P4CC services?: No  Past Medical History:  Past Medical History:  Diagnosis Date  . Anxiety   . Arthritis   . Asthma   . Bipolar disorder ([place])   . CAD S/P percutaneous coronary stenting   . CHF (congestive heart failure) ([place])   . Chicken pox   . Colon polyps   . Depression   . 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 Grief   . Hx of adenomatous colonic polyps 06/22/2016  . Hyperlipidemia   . Hypertension   . Migraines   . NSTEMI (non-ST elevated myocardial infarction) ([place])   . PTSD (post-traumatic stress disorder)     Past Surgical History:  Procedure Laterality Date  . CERVICAL DISCECTOMY    . COLONOSCOPY    . ELBOW SURGERY Left   . ELBOW SURGERY Right   . INGUINAL HERNIA REPAIR Bilateral    1996, 1997  . KNEE ARTHROSCOPY Right   . KNEE CARTILAGE SURGERY Left   . NASAL SEPTUM SURGERY    . STENT PLACEMENT VASCULAR ([place] HX)  09/02/2010  . TONSILLECTOMY     Family History:  Family History  Problem Relation Age of Onset  . Arthritis Mother   . Heart disease Mother   . Hypertension Mother   . Alzheimer's disease Mother   . Heart attack Mother   . Arthritis Father   . Hyperlipidemia Father   . Heart disease Father   . Stroke Father   . Hypertension Father   . Heart attack Father   . Multiple sclerosis Sister   . Diabetes Sister   . Lung cancer Paternal Grandmother   . Diabetes Sister   . Lung cancer Maternal Aunt   . Lung cancer Paternal Aunt    Family Psychiatric  History: none Social History:  History  Alcohol Use No     History  Drug Use No    Social History   Social History  . Marital status: Widowed    Spouse name: N/A  . Number of children: 2  . Years of education: 12   Occupational History  . Retired    Social History Main Topics  .  Smoking status: Never Smoker  . Smokeless tobacco: Never Used  . Alcohol use No  . Drug use: No  . Sexual activity: Not Currently   Other Topics Concern  . None   Social History Narrative   Retired, widowed in 2017    1 son one daughter   2 caffeinated beverages daily no alcohol or tobacco   Fun: Work out in the yard.   Denies religious beliefs effecting healthcare.    Additional Social History:    Allergies:   Allergies  Allergen Reactions  . Ambien [Zolpidem Tartrate] Other (See Comments)    Blackout, memory issues  . Seroquel [Quetiapine]     SEVERE NIGHTMARES, SLEEPWALK AND NIGHT DRIVE WITH NO RECOLLECTION UPON WAKENING    Labs:  Results for orders placed or performed during the hospital encounter of 06/09/17 (from the past 48 hour(s))  Rapid urine drug screen (hospital performed)     Status: Abnormal   Collection  Time: 06/09/17 11:57 AM  Result Value Ref Range   Opiates NONE DETECTED NONE DETECTED   Cocaine NONE DETECTED NONE DETECTED   Benzodiazepines POSITIVE (A) NONE DETECTED   Amphetamines NONE DETECTED NONE DETECTED   Tetrahydrocannabinol NONE DETECTED NONE DETECTED   Barbiturates NONE DETECTED NONE DETECTED    Comment:        DRUG SCREEN FOR MEDICAL PURPOSES ONLY.  IF CONFIRMATION IS NEEDED FOR ANY PURPOSE, NOTIFY LAB WITHIN 5 DAYS.        LOWEST DETECTABLE LIMITS FOR URINE DRUG SCREEN Drug Class       Cutoff (ng/mL) Amphetamine      1000 Barbiturate      200 Benzodiazepine   073 Tricyclics       710 Opiates          300 Cocaine          300 THC              50   Comprehensive metabolic panel     Status: Abnormal   Collection Time: 06/09/17 12:50 PM  Result Value Ref Range   Sodium 133 (L) 135 - 145 mmol/L   Potassium 3.7 3.5 - 5.1 mmol/L   Chloride 101 101 - 111 mmol/L   CO2 22 22 - 32 mmol/L   Glucose, Bld 139 (H) 65 - 99 mg/dL   BUN 15 6 - 20 mg/dL   Creatinine, Ser 0.88 0.61 - 1.24 mg/dL   Calcium 9.1 8.9 - 10.3 mg/dL   Total Protein  6.8 6.5 - 8.1 g/dL   Albumin 4.1 3.5 - 5.0 g/dL   AST 63 (H) 15 - 41 U/L   ALT 35 17 - 63 U/L   Alkaline Phosphatase 63 38 - 126 U/L   Total Bilirubin 1.1 0.3 - 1.2 mg/dL   GFR calc non Af Amer >60 >60 mL/min   GFR calc Af Amer >60 >60 mL/min    Comment: (NOTE) The eGFR has been calculated using the CKD EPI equation. This calculation has not been validated in all clinical situations. eGFR's persistently <60 mL/min signify possible Chronic Kidney Disease.    Anion gap 10 5 - 15  Ethanol     Status: None   Collection Time: 06/09/17 12:50 PM  Result Value Ref Range   Alcohol, Ethyl (B) <10 <10 mg/dL    Comment:        LOWEST DETECTABLE LIMIT FOR SERUM ALCOHOL IS 10 mg/dL FOR MEDICAL PURPOSES ONLY   cbc     Status: Abnormal   Collection Time: 06/09/17 12:50 PM  Result Value Ref Range   WBC 8.3 4.0 - 10.5 K/uL   RBC 3.68 (L) 4.22 - 5.81 MIL/uL   Hemoglobin 11.6 (L) 13.0 - 17.0 g/dL   HCT 34.8 (L) 39.0 - 52.0 %   MCV 94.6 78.0 - 100.0 fL   MCH 31.5 26.0 - 34.0 pg   MCHC 33.3 30.0 - 36.0 g/dL   RDW 13.0 11.5 - 15.5 %   Platelets 231 150 - 400 K/uL    Current Facility-Administered Medications  Medication Dose Route Frequency Provider Last Rate Last Dose  . 0.9 %  sodium chloride infusion  500 mL Intravenous Continuous Gatha Mayer, MD      . acetaminophen (TYLENOL) tablet 650 mg  650 mg Oral Q4H PRN Domenic Moras, PA-C   650 mg at 06/10/17 1108  . alum & mag hydroxide-simeth (MAALOX/MYLANTA) 200-200-20 MG/5ML suspension 30 mL  30 mL Oral Q6H PRN  Domenic Moras, PA-C      . clopidogrel (PLAVIX) tablet 75 mg  75 mg Oral Daily Domenic Moras, PA-C   75 mg at 06/10/17 1110  . diltiazem (CARDIZEM CD) 24 hr capsule 180 mg  180 mg Oral Daily Domenic Moras, PA-C   180 mg at 06/10/17 1109  . hydrOXYzine (ATARAX/VISTARIL) tablet 25 mg  25 mg Oral Q6H PRN Patrecia Pour, NP      . lamoTRIgine (LAMICTAL) tablet 250 mg  250 mg Oral Daily Domenic Moras, PA-C   250 mg at 06/10/17 1108  . loperamide  (IMODIUM) capsule 2-4 mg  2-4 mg Oral PRN Patrecia Pour, NP      . LORazepam (ATIVAN) tablet 1 mg  1 mg Oral Q6H PRN Patrecia Pour, NP      . LORazepam (ATIVAN) tablet 1 mg  1 mg Oral QID Patrecia Pour, NP   1 mg at 06/10/17 1110   Followed by  . [START ON 06/11/2017] LORazepam (ATIVAN) tablet 1 mg  1 mg Oral TID Patrecia Pour, NP       Followed by  . [START ON 06/12/2017] LORazepam (ATIVAN) tablet 1 mg  1 mg Oral BID Patrecia Pour, NP       Followed by  . [START ON 06/13/2017] LORazepam (ATIVAN) tablet 1 mg  1 mg Oral Daily Waylan Boga Y, NP      . multivitamin with minerals tablet 1 tablet  1 tablet Oral Daily Patrecia Pour, NP   1 tablet at 06/10/17 1109  . nicotine (NICODERM CQ - dosed in mg/24 hours) patch 21 mg  21 mg Transdermal Daily Domenic Moras, PA-C      . OLANZapine (ZYPREXA) tablet 10 mg  10 mg Oral QHS Domenic Moras, PA-C   10 mg at 06/09/17 2128  . ondansetron (ZOFRAN-ODT) disintegrating tablet 4 mg  4 mg Oral Q6H PRN Patrecia Pour, NP      . thiamine (B-1) injection 100 mg  100 mg Intramuscular Once Patrecia Pour, NP      . Derrill Memo ON 06/11/2017] thiamine (VITAMIN B-1) tablet 100 mg  100 mg Oral Daily Patrecia Pour, NP   100 mg at 06/10/17 1109   Current Outpatient Prescriptions  Medication Sig Dispense Refill  . CARTIA XT 180 MG 24 hr capsule Take 180 mg by mouth daily.     . clopidogrel (PLAVIX) 75 MG tablet Take 75 mg by mouth daily.    Marland Kitchen lamoTRIgine (LAMICTAL) 100 MG tablet Take 2.5 tablets (250 mg total) by mouth daily. 75 tablet 2  . nitroGLYCERIN (NITROSTAT) 0.4 MG SL tablet     . OLANZapine (ZYPREXA) 10 MG tablet Take 10 mg by mouth at bedtime.    Marland Kitchen OLANZapine (ZYPREXA) 7.5 MG tablet Take 7.5 mg by mouth at bedtime.    . rosuvastatin (CRESTOR) 20 MG tablet Take 20 mg by mouth daily.    . traZODone (DESYREL) 50 MG tablet Take 1 tablet (50 mg total) by mouth at bedtime. 30 tablet 1  . LORazepam (ATIVAN) 0.5 MG tablet Take 1 tablet (0.5 mg total) by  mouth 2 (two) times daily. (Patient not taking: Reported on 06/09/2017) 60 tablet 1  . OLANZapine (ZYPREXA) 15 MG tablet Take 1 tablet (15 mg total) by mouth at bedtime. (Patient not taking: Reported on 06/09/2017) 90 tablet 0  . tiZANidine (ZANAFLEX) 4 MG tablet Take 1 tablet (4 mg total) by mouth every 6 (six) hours as needed for  muscle spasms. (Patient not taking: Reported on 06/09/2017) 60 tablet 0  . traMADol (ULTRAM) 50 MG tablet Take 1 tablet (50 mg total) by mouth every 6 (six) hours as needed. (Patient not taking: Reported on 06/09/2017) 40 tablet 0    Musculoskeletal: Strength & Muscle Tone: within normal limits Gait & Station: normal Patient leans: N/A  Psychiatric Specialty Exam: Physical Exam  Constitutional: He is oriented to person, place, and time. He appears well-developed.  HENT:  Head: Normocephalic and atraumatic.  Neck: Normal range of motion.  Respiratory: Effort normal.  Musculoskeletal: Normal range of motion.  Neurological: He is alert and oriented to person, place, and time.  Psychiatric: His speech is normal. Judgment and thought content normal. He is withdrawn. Cognition and memory are impaired. He exhibits a depressed mood. He exhibits abnormal recent memory.    Review of Systems  Psychiatric/Behavioral: Positive for depression and suicidal ideas.  All other systems reviewed and are negative.   Blood pressure 125/76, pulse 90, temperature 98.4 F (36.9 C), temperature source Oral, resp. rate 16, SpO2 100 %.There is no height or weight on file to calculate BMI.  General Appearance: Fairly Groomed  Eye Contact:  Good  Speech:  Normal Rate  Volume:  Normal  Mood:  Depressed  Affect:  Depressed  Thought Process:  Coherent  Orientation:  Full (Time, Place, and Person)  Thought Content:  Negative  Suicidal Thoughts:  Yes with plan and intent  Homicidal Thoughts:  No  Memory:  Immediate;   Good Recent;   Fair Remote;   Fair  Judgement:  Impaired   Insight:  Fair  Psychomotor Activity:  Normal  Concentration:  Concentration: Fair and Attention Span: Fair  Recall:  [employer] of Knowledge:  Fair  Language:  Good  Akathisia:  No  Handed:  Right  AIMS (if indicated):     Assets:  Communication Skills Desire for Improvement Housing Resilience Social Support  ADL's:  Intact  Cognition:  Impaired,  Moderate  Sleep:        Treatment Plan Summary: Daily contact with patient to assess and evaluate symptoms and progress in treatment, Medication management and Plan bipolar affective disorder, depressed, severe without psychosis:  -Crisis stabilization -Medication management:  Ativan alcohol/benzo detox protocol started, restarted his Lamictal 250 mg daily for bipolar disorder and Zyprexa 10 mg at bedtime for mood stabilization -Individual counseling  Disposition: Recommend psychiatric Inpatient admission when medically cleared.  Waylan Boga, NP 06/10/2017 1:29 PM   Patient seen, chart reviewed and case discussed with the treatment team for this face-to-face psychiatric evaluation and developed treatment plan. Reviewed the information documented and agree with the treatment plan.  Kyerra Vargo 06/10/2017 7:04 PM

## 2017-06-10 NOTE — ED Notes (Addendum)
Up to the bathroom, pt encouraged to continue ice pk

## 2017-06-10 NOTE — ED Notes (Signed)
Pt reports that he is not sure how he fell.  Pt reports that he took the new medication and does not remember much of what happened after that.

## 2017-06-10 NOTE — ED Notes (Signed)
Up to the bathroom 

## 2017-06-10 NOTE — Progress Notes (Signed)
LCSW following for disposition: inpatient psychiatric facility recommendation  Patient has been referred to the following: [facility]  LCSW will follow up with referrals. All are pending at this time. Patient is voluntary.  Lane Hacker, MSW Clinical Social Work: [employer] Coverage for :  [phone]

## 2017-06-11 MED ORDER — POLYETHYLENE GLYCOL 3350 17 G PO PACK
17.0000 g | PACK | Freq: Once | ORAL | Status: AC
  Administered 2017-06-11: 17 g via ORAL
  Filled 2017-06-11: qty 1

## 2017-06-11 MED ORDER — DOCUSATE SODIUM 50 MG PO CAPS
50.0000 mg | ORAL_CAPSULE | Freq: Once | ORAL | Status: AC
  Administered 2017-06-11: 50 mg via ORAL
  Filled 2017-06-11: qty 1

## 2017-06-11 NOTE — ED Notes (Signed)
SBAR Report received from previous nurse. Pt received calm and visible on unit. Pt denies current SI/ HI, A/V H, depression, anxiety, or pain at this time, and appears otherwise stable and free of distress. Pt reminded of camera surveillance, q 15 min rounds, and rules of the milieu. Will continue to assess. 

## 2017-06-11 NOTE — Progress Notes (Signed)
Received call from [facility] requesting if pt still needing placement. Reviewed pt's chart- inpatient treatment recommended 06/10/17 per psychiatry eval. [facility] advised they will review pt's referral today.  Sharren Bridge, MSW, LCSW Clinical Social Work 06/11/2017

## 2017-06-11 NOTE — ED Notes (Signed)
Pt has been crying in his room as he grieves for the loss of his wife and dog. Support and encouragement offered.

## 2017-06-11 NOTE — Consult Note (Signed)
[facility] Consult   Reason for Consult:   1. Suicidal ideation  2. Memory changes 3. Bipolar with depression Referring Physician:  EDP  Patient Identification: Justin [name] MRN:  [id number] Principal Diagnosis: bipolar affective disorder, depressed, severe without psychosis Diagnosis:   Patient Active Problem List   Diagnosis Date Noted  . Benzodiazepine withdrawal with complication [facility]) [C37.628] 06/10/2017    Priority: High  . Bipolar 1 disorder, depressed, moderate ([place]) [F31.32] 04/05/2017    Priority: High    Class: Chronic  . Generalized anxiety disorder [F41.1] 12/23/2014    Priority: High  . Memory changes [R41.3] 06/09/2016    Priority: Medium  . Nail lesion [L60.9] 02/25/2017  . Hx of adenomatous colonic polyps [Z86.010] 06/22/2016  . CAD S/P percutaneous coronary stenting [I25.10, Z98.61]   . Right knee pain [M25.561] 01/01/2016  . Olecranon bursitis of right elbow [M70.21] 12/10/2015  . Bipolar I disorder ([place]) [F31.9] 06/26/2015  . Routine general medical examination at a health care facility [Z00.00] 03/26/2015  . Medicare annual wellness visit, subsequent [Z00.00] 03/26/2015  . Rash and nonspecific skin eruption [R21] 03/13/2015  . Low back pain [M54.5] 12/23/2014  . Essential hypertension [I10] 12/23/2014    Total Time spent with patient: 30 minutes  Subjective:   Justin [name] is a 71 y.o. male patient admitted to the ER following recent increasing insomnia and noted memory changes, increase in depression with suicidal ideations and plan to overdose or shoot himself.  HPI:  Patient is a 71 year old Caucasian Male who presents to ER after family had noted increasing memory changes after patient had stopped his Klonopin or Xanax. Patient is more clear today with no withdrawal symptoms.  His medications may have been mistaken by accident, dosing Klonopin like his Xanax but unclear as his cognitive state was disturbed on admission, multiple  falls prior to admission but stable now.  Much more clear and coherent today, agreeable to get help for his depression and medication management.  Past Psychiatric History:  2 prior suicide attempts Currently being seen out patient by Dr. Adele Schilder. Bipolar disorder   Risk to Self: Suicidal Ideation: Yes-Currently Present Suicidal Intent: No Is patient at risk for suicide?: Yes Suicidal Plan?: No Access to Means: No What has been your use of drugs/alcohol within the last 12 months?: Denies How many times?: 2 Other Self Harm Risks:  (NA) Triggers for Past Attempts:  (Deaths in family) Intentional Self Injurious Behavior: None Risk to Others: Homicidal Ideation: No Thoughts of Harm to Others: No Current Homicidal Intent: No Current Homicidal Plan: No Access to Homicidal Means: No Identified Victim: NA History of harm to others?: No Assessment of Violence: None Noted Violent Behavior Description: NA Does patient have access to weapons?: No Criminal Charges Pending?: No Does patient have a court date: No Prior Inpatient Therapy: Prior Inpatient Therapy: No Prior Therapy Dates: NA Prior Therapy Facilty/Provider(s): NA Reason for Treatment: NA Prior Outpatient Therapy: Prior Outpatient Therapy: Yes Prior Therapy Dates: Ongoing Prior Therapy Facilty/Provider(s): [facility] MD Reason for Treatment: Med mang Does patient have an ACCT team?: No Does patient have Intensive In-House Services?  : No Does patient have [facility] services? : No Does patient have P4CC services?: No  Past Medical History:  Past Medical History:  Diagnosis Date  . Anxiety   . Arthritis   . Asthma   . Bipolar disorder ([place])   . CAD S/P percutaneous coronary stenting   . CHF (congestive heart failure) ([place])   .  Chicken pox   . Colon polyps   . Depression   . 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 Grief   . Hx of adenomatous colonic polyps 06/22/2016  . Hyperlipidemia   . Hypertension   . Migraines   . NSTEMI  (non-ST elevated myocardial infarction) ([place])   . PTSD (post-traumatic stress disorder)     Past Surgical History:  Procedure Laterality Date  . CERVICAL DISCECTOMY    . COLONOSCOPY    . ELBOW SURGERY Left   . ELBOW SURGERY Right   . INGUINAL HERNIA REPAIR Bilateral    1996, 1997  . KNEE ARTHROSCOPY Right   . KNEE CARTILAGE SURGERY Left   . NASAL SEPTUM SURGERY    . STENT PLACEMENT VASCULAR ([place] [facility])  09/02/2010  . TONSILLECTOMY     Family History:  Family History  Problem Relation Age of Onset  . Arthritis Mother   . Heart disease Mother   . Hypertension Mother   . Alzheimer's disease Mother   . Heart attack Mother   . Arthritis Father   . Hyperlipidemia Father   . Heart disease Father   . Stroke Father   . Hypertension Father   . Heart attack Father   . Multiple sclerosis Sister   . Diabetes Sister   . Lung cancer Paternal Grandmother   . Diabetes Sister   . Lung cancer Maternal Aunt   . Lung cancer Paternal Aunt    Family Psychiatric  History: none Social History:  History  Alcohol Use No     History  Drug Use No    Social History   Social History  . Marital status: Widowed    Spouse name: N/A  . Number of children: 2  . Years of education: 12   Occupational History  . Retired    Social History Main Topics  . Smoking status: Never Smoker  . Smokeless tobacco: Never Used  . Alcohol use No  . Drug use: No  . Sexual activity: Not Currently   Other Topics Concern  . None   Social History Narrative   Retired, widowed in 2017    1 son one daughter   2 caffeinated beverages daily no alcohol or tobacco   Fun: Work out in the yard.   Denies religious beliefs effecting healthcare.    Additional Social History:    Allergies:   Allergies  Allergen Reactions  . Ambien [Zolpidem Tartrate] Other (See Comments)    Blackout, memory issues  . Seroquel [Quetiapine]     SEVERE NIGHTMARES, SLEEPWALK AND NIGHT DRIVE WITH NO RECOLLECTION UPON WAKENING     Labs:  No results found for this or any previous visit (from the past 25 hour(s)).  Current Facility-Administered Medications  Medication Dose Route Frequency Provider Last Rate Last Dose  . 0.9 %  sodium chloride infusion  500 mL Intravenous Continuous Gatha Mayer, MD      . acetaminophen (TYLENOL) tablet 650 mg  650 mg Oral Q4H PRN Domenic Moras, PA-C   650 mg at 06/11/17 1437  . alum & mag hydroxide-simeth (MAALOX/MYLANTA) 200-200-20 MG/5ML suspension 30 mL  30 mL Oral Q6H PRN Domenic Moras, PA-C      . clopidogrel (PLAVIX) tablet 75 mg  75 mg Oral Daily Domenic Moras, PA-C   75 mg at 06/11/17 1025  . diltiazem (CARDIZEM CD) 24 hr capsule 180 mg  180 mg Oral Daily Domenic Moras, PA-C   180 mg at 06/11/17 1025  . hydrOXYzine (ATARAX/VISTARIL) tablet  25 mg  25 mg Oral Q6H PRN Patrecia Pour, NP      . lamoTRIgine (LAMICTAL) tablet 250 mg  250 mg Oral Daily Domenic Moras, PA-C   250 mg at 06/11/17 1024  . loperamide (IMODIUM) capsule 2-4 mg  2-4 mg Oral PRN Patrecia Pour, NP      . LORazepam (ATIVAN) tablet 1 mg  1 mg Oral Q6H PRN Patrecia Pour, NP      . LORazepam (ATIVAN) tablet 1 mg  1 mg Oral TID Patrecia Pour, NP   1 mg at 06/11/17 1558   Followed by  . [START ON 06/12/2017] LORazepam (ATIVAN) tablet 1 mg  1 mg Oral BID Patrecia Pour, NP       Followed by  . [START ON 06/13/2017] LORazepam (ATIVAN) tablet 1 mg  1 mg Oral Daily Patrecia Pour, NP      . multivitamin with minerals tablet 1 tablet  1 tablet Oral Daily Patrecia Pour, NP   1 tablet at 06/11/17 1025  . nicotine (NICODERM CQ - dosed in mg/24 hours) patch 21 mg  21 mg Transdermal Daily Domenic Moras, PA-C      . OLANZapine (ZYPREXA) tablet 10 mg  10 mg Oral QHS Domenic Moras, PA-C   10 mg at 06/10/17 2119  . ondansetron (ZOFRAN-ODT) disintegrating tablet 4 mg  4 mg Oral Q6H PRN Patrecia Pour, NP      . thiamine (VITAMIN B-1) tablet 100 mg  100 mg Oral Daily Patrecia Pour, NP   100 mg at 06/11/17 1024   Current  Outpatient Prescriptions  Medication Sig Dispense Refill  . CARTIA XT 180 MG 24 hr capsule Take 180 mg by mouth daily.     . clopidogrel (PLAVIX) 75 MG tablet Take 75 mg by mouth daily.    Marland Kitchen lamoTRIgine (LAMICTAL) 100 MG tablet Take 2.5 tablets (250 mg total) by mouth daily. 75 tablet 2  . nitroGLYCERIN (NITROSTAT) 0.4 MG SL tablet     . OLANZapine (ZYPREXA) 10 MG tablet Take 10 mg by mouth at bedtime.    Marland Kitchen OLANZapine (ZYPREXA) 7.5 MG tablet Take 7.5 mg by mouth at bedtime.    . rosuvastatin (CRESTOR) 20 MG tablet Take 20 mg by mouth daily.    . traZODone (DESYREL) 50 MG tablet Take 1 tablet (50 mg total) by mouth at bedtime. 30 tablet 1  . LORazepam (ATIVAN) 0.5 MG tablet Take 1 tablet (0.5 mg total) by mouth 2 (two) times daily. (Patient not taking: Reported on 06/09/2017) 60 tablet 1  . OLANZapine (ZYPREXA) 15 MG tablet Take 1 tablet (15 mg total) by mouth at bedtime. (Patient not taking: Reported on 06/09/2017) 90 tablet 0  . tiZANidine (ZANAFLEX) 4 MG tablet Take 1 tablet (4 mg total) by mouth every 6 (six) hours as needed for muscle spasms. (Patient not taking: Reported on 06/09/2017) 60 tablet 0  . traMADol (ULTRAM) 50 MG tablet Take 1 tablet (50 mg total) by mouth every 6 (six) hours as needed. (Patient not taking: Reported on 06/09/2017) 40 tablet 0    Musculoskeletal: Strength & Muscle Tone: within normal limits Gait & Station: normal Patient leans: N/A  Psychiatric Specialty Exam: Physical Exam  Constitutional: He is oriented to person, place, and time. He appears well-developed.  HENT:  Head: Normocephalic and atraumatic.  Neck: Normal range of motion.  Respiratory: Effort normal.  Musculoskeletal: Normal range of motion.  Neurological: He is alert and oriented to person,  place, and time.  Psychiatric: His speech is normal. Judgment and thought content normal. He is withdrawn. Cognition and memory are impaired. He exhibits a depressed mood. He exhibits abnormal recent  memory.    Review of Systems  Psychiatric/Behavioral: Positive for depression and suicidal ideas.  All other systems reviewed and are negative.   Blood pressure 102/64, pulse 78, temperature 98.2 F (36.8 C), temperature source Oral, resp. rate 20, SpO2 96 %.There is no height or weight on file to calculate BMI.  General Appearance: Fairly Groomed  Eye Contact:  Good  Speech:  Normal Rate  Volume:  Normal  Mood:  Depressed  Affect:  Depressed  Thought Process:  Coherent  Orientation:  Full (Time, Place, and Person)  Thought Content:  Negative  Suicidal Thoughts:  Yes with plan and intent  Homicidal Thoughts:  No  Memory:  Immediate;   Good Recent;   Fair Remote;   Fair  Judgement:  Impaired  Insight:  Fair  Psychomotor Activity:  Normal  Concentration:  Concentration: Fair and Attention Span: Fair  Recall:  [employer] of Knowledge:  Fair  Language:  Good  Akathisia:  No  Handed:  Right  AIMS (if indicated):     Assets:  Communication Skills Desire for Improvement Housing Resilience Social Support  ADL's:  Intact  Cognition:  Impaired,  Moderate  Sleep:        Treatment Plan Summary: Daily contact with patient to assess and evaluate symptoms and progress in treatment, Medication management and Plan bipolar affective disorder, depressed, severe without psychosis:  -Crisis stabilization -Medication management:  Ativan alcohol/benzo detox protocol started, restarted his Lamictal 250 mg daily for bipolar disorder and Zyprexa 10 mg at bedtime for mood stabilization -Individual counseling  Disposition: Recommend psychiatric Inpatient admission when medically cleared.  Waylan Boga, NP 06/11/2017 4:12 PM   Patient seen, chart reviewed and case discussed with the treatment team for this face-to-face psychiatric evaluation and developed treatment plan.Reviewed the information documented and agree with the treatment plan.  Amy Belloso 06/13/2017 12:07 PM

## 2017-06-11 NOTE — ED Notes (Addendum)
Pt's sister asked that she be notified when pt is transferred. Pt signed consent to release information, which is in chart. She says she is his HCPOA. She was asked to bring in paperwork, and she said she kept forgetting to do so. Her # is [id number].   Pt also voiced concern about bruising to his left lateral hip area. He said he could not remember if it had been looked at. Explained to him that it had been X-rayed on the 19th.

## 2017-06-11 NOTE — Progress Notes (Signed)
CSW following for disposition needs.   Patient has been referred to the following facilities:  [facility]- no male beds 10/21  CSW will continue to follow for disposition needs.   Kingsley Spittle, [facility] Room Clinical Social Worker [phone]

## 2017-06-12 ENCOUNTER — Encounter (HOSPITAL_COMMUNITY): Payer: Self-pay

## 2017-06-12 ENCOUNTER — Inpatient Hospital Stay (HOSPITAL_COMMUNITY)
Admission: AD | Admit: 2017-06-12 | Discharge: 2017-06-15 | DRG: 885 | Disposition: A | Discharge status: Home / Self Care | Payer: Medicare Other | Source: Intra-hospital | Attending: Psychiatry | Admitting: Psychiatry

## 2017-06-12 ENCOUNTER — Emergency Department (HOSPITAL_COMMUNITY): Payer: Medicare Other

## 2017-06-12 DIAGNOSIS — G4700 Insomnia, unspecified: Secondary | ICD-10-CM | POA: Diagnosis present

## 2017-06-12 DIAGNOSIS — F13239 Sedative, hypnotic or anxiolytic dependence with withdrawal, unspecified: Secondary | ICD-10-CM

## 2017-06-12 DIAGNOSIS — Z888 Allergy status to other drugs, medicaments and biological substances status: Secondary | ICD-10-CM | POA: Diagnosis not present

## 2017-06-12 DIAGNOSIS — F411 Generalized anxiety disorder: Secondary | ICD-10-CM

## 2017-06-12 DIAGNOSIS — F314 Bipolar disorder, current episode depressed, severe, without psychotic features: Secondary | ICD-10-CM | POA: Diagnosis present

## 2017-06-12 DIAGNOSIS — I10 Essential (primary) hypertension: Secondary | ICD-10-CM | POA: Diagnosis present

## 2017-06-12 DIAGNOSIS — J45909 Unspecified asthma, uncomplicated: Secondary | ICD-10-CM | POA: Diagnosis present

## 2017-06-12 DIAGNOSIS — I110 Hypertensive heart disease with heart failure: Secondary | ICD-10-CM | POA: Diagnosis present

## 2017-06-12 DIAGNOSIS — F4310 Post-traumatic stress disorder, unspecified: Secondary | ICD-10-CM | POA: Diagnosis present

## 2017-06-12 DIAGNOSIS — Z79899 Other long term (current) drug therapy: Secondary | ICD-10-CM | POA: Diagnosis not present

## 2017-06-12 DIAGNOSIS — E785 Hyperlipidemia, unspecified: Secondary | ICD-10-CM | POA: Diagnosis present

## 2017-06-12 DIAGNOSIS — Z955 Presence of coronary angioplasty implant and graft: Secondary | ICD-10-CM | POA: Diagnosis not present

## 2017-06-12 DIAGNOSIS — F419 Anxiety disorder, unspecified: Secondary | ICD-10-CM | POA: Diagnosis present

## 2017-06-12 DIAGNOSIS — F3132 Bipolar disorder, current episode depressed, moderate: Secondary | ICD-10-CM | POA: Diagnosis not present

## 2017-06-12 DIAGNOSIS — I2510 Atherosclerotic heart disease of native coronary artery without angina pectoris: Secondary | ICD-10-CM | POA: Diagnosis present

## 2017-06-12 DIAGNOSIS — F315 Bipolar disorder, current episode depressed, severe, with psychotic features: Secondary | ICD-10-CM | POA: Diagnosis not present

## 2017-06-12 DIAGNOSIS — Z818 Family history of other mental and behavioral disorders: Secondary | ICD-10-CM

## 2017-06-12 DIAGNOSIS — I509 Heart failure, unspecified: Secondary | ICD-10-CM | POA: Diagnosis present

## 2017-06-12 DIAGNOSIS — I252 Old myocardial infarction: Secondary | ICD-10-CM | POA: Diagnosis not present

## 2017-06-12 DIAGNOSIS — Z7902 Long term (current) use of antithrombotics/antiplatelets: Secondary | ICD-10-CM | POA: Diagnosis not present

## 2017-06-12 LAB — URINALYSIS, ROUTINE W REFLEX MICROSCOPIC
Bilirubin Urine: NEGATIVE
Glucose, UA: NEGATIVE mg/dL
Hgb urine dipstick: NEGATIVE
Ketones, ur: NEGATIVE mg/dL
Leukocytes, UA: NEGATIVE
Nitrite: NEGATIVE
Protein, ur: NEGATIVE mg/dL
Specific Gravity, Urine: 1.008 (ref 1.005–1.030)
pH: 8 (ref 5.0–8.0)

## 2017-06-12 MED ORDER — LORAZEPAM 1 MG PO TABS
1.0000 mg | ORAL_TABLET | Freq: Every day | ORAL | Status: AC
  Administered 2017-06-13: 1 mg via ORAL
  Filled 2017-06-12: qty 1

## 2017-06-12 MED ORDER — NICOTINE 21 MG/24HR TD PT24
21.0000 mg | MEDICATED_PATCH | Freq: Every day | TRANSDERMAL | Status: DC
  Filled 2017-06-12: qty 1

## 2017-06-12 MED ORDER — CLOPIDOGREL BISULFATE 75 MG PO TABS
75.0000 mg | ORAL_TABLET | Freq: Every day | ORAL | Status: DC
  Administered 2017-06-13 – 2017-06-15 (×3): 75 mg via ORAL
  Filled 2017-06-12 (×6): qty 1

## 2017-06-12 MED ORDER — LORAZEPAM 1 MG PO TABS
1.0000 mg | ORAL_TABLET | Freq: Two times a day (BID) | ORAL | Status: AC
  Administered 2017-06-12: 1 mg via ORAL
  Filled 2017-06-12: qty 1

## 2017-06-12 MED ORDER — TRAZODONE HCL 50 MG PO TABS
50.0000 mg | ORAL_TABLET | Freq: Every day | ORAL | Status: DC
  Administered 2017-06-12: 50 mg via ORAL
  Filled 2017-06-12 (×2): qty 1

## 2017-06-12 MED ORDER — LAMOTRIGINE 150 MG PO TABS
250.0000 mg | ORAL_TABLET | Freq: Every day | ORAL | Status: DC
  Administered 2017-06-13 – 2017-06-15 (×3): 250 mg via ORAL
  Filled 2017-06-12 (×7): qty 1

## 2017-06-12 MED ORDER — ADULT MULTIVITAMIN W/MINERALS CH
1.0000 | ORAL_TABLET | Freq: Every day | ORAL | Status: DC
  Administered 2017-06-13 – 2017-06-15 (×3): 1 via ORAL
  Filled 2017-06-12 (×5): qty 1

## 2017-06-12 MED ORDER — ACETAMINOPHEN 325 MG PO TABS
650.0000 mg | ORAL_TABLET | ORAL | Status: DC | PRN
  Administered 2017-06-13 – 2017-06-14 (×2): 650 mg via ORAL
  Filled 2017-06-12 (×2): qty 2

## 2017-06-12 MED ORDER — MAGNESIUM HYDROXIDE 400 MG/5ML PO SUSP
30.0000 mL | Freq: Every day | ORAL | Status: DC | PRN
  Administered 2017-06-14: 30 mL via ORAL
  Filled 2017-06-12: qty 30

## 2017-06-12 MED ORDER — VITAMIN B-1 100 MG PO TABS
100.0000 mg | ORAL_TABLET | Freq: Every day | ORAL | Status: DC
  Administered 2017-06-13 – 2017-06-15 (×3): 100 mg via ORAL
  Filled 2017-06-12 (×5): qty 1

## 2017-06-12 MED ORDER — DILTIAZEM HCL ER COATED BEADS 180 MG PO CP24
180.0000 mg | ORAL_CAPSULE | Freq: Every day | ORAL | Status: DC
  Administered 2017-06-14 – 2017-06-15 (×2): 180 mg via ORAL
  Filled 2017-06-12 (×5): qty 1

## 2017-06-12 MED ORDER — HYDROXYZINE HCL 25 MG PO TABS: 25.0000 mg | ORAL_TABLET | Freq: Four times a day (QID) | ORAL | Status: AC | PRN

## 2017-06-12 MED ORDER — OLANZAPINE 10 MG PO TABS
10.0000 mg | ORAL_TABLET | Freq: Every day | ORAL | Status: DC
  Administered 2017-06-12 – 2017-06-14 (×3): 10 mg via ORAL
  Filled 2017-06-12 (×6): qty 1

## 2017-06-12 MED ORDER — LORAZEPAM 1 MG PO TABS: 1.0000 mg | ORAL_TABLET | Freq: Four times a day (QID) | ORAL | Status: AC | PRN

## 2017-06-12 MED ORDER — TRAZODONE HCL 50 MG PO TABS: 50.0000 mg | ORAL_TABLET | Freq: Every day | ORAL | Status: DC

## 2017-06-12 MED ORDER — ONDANSETRON 4 MG PO TBDP
4.0000 mg | ORAL_TABLET | Freq: Four times a day (QID) | ORAL | Status: AC | PRN
Start: 2017-06-12 — End: 2017-06-13

## 2017-06-12 MED ORDER — LOPERAMIDE HCL 2 MG PO CAPS: 2.0000 mg | ORAL_CAPSULE | ORAL | Status: AC | PRN

## 2017-06-12 MED ORDER — ALUM & MAG HYDROXIDE-SIMETH 200-200-20 MG/5ML PO SUSP: 30.0000 mL | Freq: Four times a day (QID) | ORAL | Status: DC | PRN

## 2017-06-12 NOTE — Progress Notes (Signed)
06/12/17 1356:  LRT introduced self to pt and offered activities.  Pt declined stating he was waiting for his cast to be put on his hand.  Pt was pleasant.   Victorino Sparrow, LRT/CTRS

## 2017-06-12 NOTE — ED Notes (Signed)
Visitor at bedside.

## 2017-06-12 NOTE — Consult Note (Signed)
[place] [facility] Consult   Reason for Consult:   1. Suicidal ideation  2. Memory changes 3. Bipolar with depression Referring Physician:  EDP  Patient Identification: JACOBE STUDY MRN:  [id number] Principal Diagnosis: bipolar affective disorder, depressed, severe without psychosis Diagnosis:   Patient Active Problem List   Diagnosis Date Noted  . Bipolar affective disorder, depressed, severe ([place]) [F31.4] 06/12/2017    Priority: High  . Benzodiazepine withdrawal with complication [facility]) [I77.824] 06/10/2017    Priority: High  . Generalized anxiety disorder [F41.1] 12/23/2014    Priority: High  . Memory changes [R41.3] 06/09/2016    Priority: Medium  . Nail lesion [L60.9] 02/25/2017  . Hx of adenomatous colonic polyps [Z86.010] 06/22/2016  . CAD S/P percutaneous coronary stenting [I25.10, Z98.61]   . Right knee pain [M25.561] 01/01/2016  . Olecranon bursitis of right elbow [M70.21] 12/10/2015  . Bipolar I disorder ([place]) [F31.9] 06/26/2015  . Routine general medical examination at a health care facility [Z00.00] 03/26/2015  . Medicare annual wellness visit, subsequent [Z00.00] 03/26/2015  . Rash and nonspecific skin eruption [R21] 03/13/2015  . Low back pain [M54.5] 12/23/2014  . Essential hypertension [I10] 12/23/2014    Total Time spent with patient: 30 minutes  Subjective:   HENRI BAUMLER is a 71 y.o. male patient admitted to the ER following recent increasing insomnia and noted memory changes, increase in depression with suicidal ideations and plan to overdose or shoot himself.  HPI:  Patient is a 72 year old Caucasian Male who presented to ED with suicidal ideations after losing his wife a year ago and his dog a month ago.  Cognitive disturbance related to depression may have affected how he took his medications, mistakenly mistaking them.  He is now much clearer with no instability in his gait.  [place] complains of insomnia, medications started  Past  Psychiatric History:  2 prior suicide attempts Currently being seen out patient by Dr. Adele Schilder. Bipolar disorder   Risk to Self: Suicidal Ideation: Yes-Currently Present Suicidal Intent: No Is patient at risk for suicide?: Yes Suicidal Plan?: No Access to Means: No What has been your use of drugs/alcohol within the last 12 months?: Denies How many times?: 2 Other Self Harm Risks:  (NA) Triggers for Past Attempts:  (Deaths in family) Intentional Self Injurious Behavior: None Risk to Others: Homicidal Ideation: No Thoughts of Harm to Others: No Current Homicidal Intent: No Current Homicidal Plan: No Access to Homicidal Means: No Identified Victim: NA History of harm to others?: No Assessment of Violence: None Noted Violent Behavior Description: NA Does patient have access to weapons?: No Criminal Charges Pending?: No Does patient have a court date: No Prior Inpatient Therapy: Prior Inpatient Therapy: No Prior Therapy Dates: NA Prior Therapy Facilty/Provider(s): NA Reason for Treatment: NA Prior Outpatient Therapy: Prior Outpatient Therapy: Yes Prior Therapy Dates: Ongoing Prior Therapy Facilty/Provider(s): Arfeen MD Reason for Treatment: Med mang Does patient have an ACCT team?: No Does patient have Intensive In-House Services?  : No Does patient have [facility] services? : No Does patient have P4CC services?: No  Past Medical History:  Past Medical History:  Diagnosis Date  . Anxiety   . Arthritis   . Asthma   . Bipolar disorder ([place])   . CAD S/P percutaneous coronary stenting   . CHF (congestive heart failure) ([place])   . Chicken pox   . Colon polyps   . Depression   . 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 Grief   . Hx  of adenomatous colonic polyps 06/22/2016  . Hyperlipidemia   . Hypertension   . Migraines   . NSTEMI (non-ST elevated myocardial infarction) ([place])   . PTSD (post-traumatic stress disorder)     Past Surgical History:  Procedure Laterality Date  .  CERVICAL DISCECTOMY    . COLONOSCOPY    . ELBOW SURGERY Left   . ELBOW SURGERY Right   . INGUINAL HERNIA REPAIR Bilateral    1996, 1997  . KNEE ARTHROSCOPY Right   . KNEE CARTILAGE SURGERY Left   . NASAL SEPTUM SURGERY    . STENT PLACEMENT VASCULAR ([place] HX)  09/02/2010  . TONSILLECTOMY     Family History:  Family History  Problem Relation Age of Onset  . Arthritis Mother   . Heart disease Mother   . Hypertension Mother   . Alzheimer's disease Mother   . Heart attack Mother   . Arthritis Father   . Hyperlipidemia Father   . Heart disease Father   . Stroke Father   . Hypertension Father   . Heart attack Father   . Multiple sclerosis Sister   . Diabetes Sister   . Lung cancer Paternal Grandmother   . Diabetes Sister   . Lung cancer Maternal Aunt   . Lung cancer Paternal Aunt    Family Psychiatric  History: none Social History:  History  Alcohol Use No     History  Drug Use No    Social History   Social History  . Marital status: Widowed    Spouse name: N/A  . Number of children: 2  . Years of education: 12   Occupational History  . Retired    Social History Main Topics  . Smoking status: Never Smoker  . Smokeless tobacco: Never Used  . Alcohol use No  . Drug use: No  . Sexual activity: Not Currently   Other Topics Concern  . None   Social History Narrative   Retired, widowed in 2017    1 son one daughter   2 caffeinated beverages daily no alcohol or tobacco   Fun: Work out in the yard.   Denies religious beliefs effecting healthcare.    Additional Social History:    Allergies:   Allergies  Allergen Reactions  . Ambien [Zolpidem Tartrate] Other (See Comments)    Blackout, memory issues  . Seroquel [Quetiapine]     SEVERE NIGHTMARES, SLEEPWALK AND NIGHT DRIVE WITH NO RECOLLECTION UPON WAKENING    Labs:  No results found for this or any previous visit (from the past 29 hour(s)).  Current Facility-Administered Medications  Medication Dose  Route Frequency Provider Last Rate Last Dose  . 0.9 %  sodium chloride infusion  500 mL Intravenous Continuous Gatha Mayer, MD      . acetaminophen (TYLENOL) tablet 650 mg  650 mg Oral Q4H PRN Domenic Moras, PA-C   650 mg at 06/11/17 2038  . alum & mag hydroxide-simeth (MAALOX/MYLANTA) 200-200-20 MG/5ML suspension 30 mL  30 mL Oral Q6H PRN Domenic Moras, PA-C      . clopidogrel (PLAVIX) tablet 75 mg  75 mg Oral Daily Domenic Moras, PA-C   75 mg at 06/12/17 0943  . diltiazem (CARDIZEM CD) 24 hr capsule 180 mg  180 mg Oral Daily Domenic Moras, PA-C   180 mg at 06/12/17 0944  . hydrOXYzine (ATARAX/VISTARIL) tablet 25 mg  25 mg Oral Q6H PRN Patrecia Pour, NP      . lamoTRIgine (LAMICTAL) tablet 250 mg  250  mg Oral Daily Domenic Moras, PA-C   250 mg at 06/12/17 8413  . loperamide (IMODIUM) capsule 2-4 mg  2-4 mg Oral PRN Patrecia Pour, NP      . LORazepam (ATIVAN) tablet 1 mg  1 mg Oral Q6H PRN Patrecia Pour, NP      . LORazepam (ATIVAN) tablet 1 mg  1 mg Oral BID Patrecia Pour, NP   1 mg at 06/12/17 0944   Followed by  . [START ON 06/13/2017] LORazepam (ATIVAN) tablet 1 mg  1 mg Oral Daily Waylan Boga Y, NP      . multivitamin with minerals tablet 1 tablet  1 tablet Oral Daily Patrecia Pour, NP   1 tablet at 06/12/17 0944  . nicotine (NICODERM CQ - dosed in mg/24 hours) patch 21 mg  21 mg Transdermal Daily Domenic Moras, PA-C      . OLANZapine (ZYPREXA) tablet 10 mg  10 mg Oral QHS Domenic Moras, PA-C   10 mg at 06/11/17 2115  . ondansetron (ZOFRAN-ODT) disintegrating tablet 4 mg  4 mg Oral Q6H PRN Patrecia Pour, NP      . thiamine (VITAMIN B-1) tablet 100 mg  100 mg Oral Daily Patrecia Pour, NP   100 mg at 06/12/17 2440   Current Outpatient Prescriptions  Medication Sig Dispense Refill  . CARTIA XT 180 MG 24 hr capsule Take 180 mg by mouth daily.     . clopidogrel (PLAVIX) 75 MG tablet Take 75 mg by mouth daily.    Marland Kitchen lamoTRIgine (LAMICTAL) 100 MG tablet Take 2.5 tablets (250 mg total) by  mouth daily. 75 tablet 2  . nitroGLYCERIN (NITROSTAT) 0.4 MG SL tablet     . OLANZapine (ZYPREXA) 10 MG tablet Take 10 mg by mouth at bedtime.    Marland Kitchen OLANZapine (ZYPREXA) 7.5 MG tablet Take 7.5 mg by mouth at bedtime.    . rosuvastatin (CRESTOR) 20 MG tablet Take 20 mg by mouth daily.    . traZODone (DESYREL) 50 MG tablet Take 1 tablet (50 mg total) by mouth at bedtime. 30 tablet 1  . LORazepam (ATIVAN) 0.5 MG tablet Take 1 tablet (0.5 mg total) by mouth 2 (two) times daily. (Patient not taking: Reported on 06/09/2017) 60 tablet 1  . OLANZapine (ZYPREXA) 15 MG tablet Take 1 tablet (15 mg total) by mouth at bedtime. (Patient not taking: Reported on 06/09/2017) 90 tablet 0  . tiZANidine (ZANAFLEX) 4 MG tablet Take 1 tablet (4 mg total) by mouth every 6 (six) hours as needed for muscle spasms. (Patient not taking: Reported on 06/09/2017) 60 tablet 0  . traMADol (ULTRAM) 50 MG tablet Take 1 tablet (50 mg total) by mouth every 6 (six) hours as needed. (Patient not taking: Reported on 06/09/2017) 40 tablet 0    Musculoskeletal: Strength & Muscle Tone: within normal limits Gait & Station: normal Patient leans: N/A  Psychiatric Specialty Exam: Physical Exam  Constitutional: He is oriented to person, place, and time. He appears well-developed.  HENT:  Head: Normocephalic and atraumatic.  Neck: Normal range of motion.  Respiratory: Effort normal.  Musculoskeletal: Normal range of motion.  Neurological: He is alert and oriented to person, place, and time.  Psychiatric: His speech is normal. Judgment and thought content normal. He is withdrawn. Cognition and memory are impaired. He exhibits a depressed mood. He exhibits abnormal recent memory.    Review of Systems  Psychiatric/Behavioral: Positive for depression and suicidal ideas.  All other systems reviewed and  are negative.   Blood pressure 130/79, pulse 73, temperature 99.3 F (37.4 C), temperature source Oral, resp. rate 18, SpO2 100 %.There  is no height or weight on file to calculate BMI.  General Appearance: Fairly Groomed  Eye Contact:  Good  Speech:  Normal Rate  Volume:  Normal  Mood:  Depressed  Affect:  Depressed  Thought Process:  Coherent  Orientation:  Full (Time, Place, and Person)  Thought Content:  Negative  Suicidal Thoughts:  Yes with plan and intent  Homicidal Thoughts:  No  Memory:  Immediate;   Good Recent;   Fair Remote;   Fair  Judgement:  Impaired  Insight:  Fair  Psychomotor Activity:  Normal  Concentration:  Concentration: Fair and Attention Span: Fair  Recall:  [employer] of Knowledge:  Fair  Language:  Good  Akathisia:  No  Handed:  Right  AIMS (if indicated):     Assets:  Communication Skills Desire for Improvement Housing Resilience Social Support  ADL's:  Intact  Cognition:  Impaired,  Moderate  Sleep:   poor     Treatment Plan Summary: Daily contact with patient to assess and evaluate symptoms and progress in treatment, Medication management and Plan bipolar affective disorder, depressed, severe without psychosis:  -Crisis stabilization -Medication management:  Continued Ativan alcohol/benzo detox protocol, Lamictal 250 mg daily for bipolar disorder and Zyprexa 10 mg at bedtime for mood stabilization, started Trazodone 50 mg at bedtime for sleep with repeat times one in one hour if no effects -Individual counseling  Disposition: Recommend psychiatric Inpatient admission when medically cleared.  Waylan Boga, NP 06/12/2017 11:08 AM   Patient seen, chart reviewed and case discussed with the treatment team for this face-to-face psychiatric evaluation and developed treatment plan. Reviewed the information documented and agree with the treatment plan.  Waylan Boga 06/12/2017 11:08 AM  Patient seen face-to-face for psychiatric evaluation, chart reviewed and case discussed with the physician extender and developed treatment plan. Reviewed the information documented and agree  with the treatment plan. Corena Pilgrim, MD

## 2017-06-12 NOTE — BHH Counselor (Signed)
Clinician spoke to York Cerise' at [employer] asking for the pt's social security number, to see how many medicare days the pt has left. Clinician transferred York Cerise' to the pt's nurse, to gather the infromation.    Vertell Novak, MS, [facility], [facility] Triage Specialist [phone]

## 2017-06-12 NOTE — ED Notes (Signed)
Pt behavior cooperative, sad affect. Pt has been compliant with medication regimen this shift. Pt denies SI, expressing hopelessness. Encourgement and support provided. Special checks q 15 mins in place for safety, Video monitoring in place. Will continue to monitor.

## 2017-06-12 NOTE — BH Assessment (Signed)
[place] Assessment Progress Note  Per Corena Pilgrim, MD, this pt requires psychiatric hospitalization at this time.  Leonia Reader, RN, [facility] has assigned pt to [facility] Rm 404-2.  Pt has signed Voluntary Admission and Consent for Treatment, as well as Consent to Release Information to Berniece Andreas, MD and to Janett Billow, both at the [facility], and a notification call has been placed.  Signed forms have been faxed to [facility].  Pt's nurse, Caryl Pina, has been notified, and agrees to send original paperwork along with pt via [facility], and to call report to [phone].  Jalene Mullet, [place] Triage Specialist [phone]

## 2017-06-12 NOTE — ED Notes (Signed)
Patient notified of his transfer to [facility]. [facility] transportation called - to be here in 20 mins. Report already given by the previous nurse. Patient awaits his transfer.

## 2017-06-12 NOTE — ED Notes (Signed)
Ortho tech at bedside 

## 2017-06-12 NOTE — Tx Team (Signed)
Initial Treatment Plan 06/12/2017 9:21 PM [place] EZV:[id number]    PATIENT STRESSORS: Health problems Loss of wife and dog   PATIENT STRENGTHS: Ability for insight Average or above average intelligence Capable of independent living Communication skills Financial means General fund of knowledge   PATIENT IDENTIFIED PROBLEMS: "Get closure with my wife"  "Accept that I'm going to have to live by myself."                   DISCHARGE CRITERIA:  Ability to meet basic life and health needs Adequate post-discharge living arrangements Improved stabilization in mood, thinking, and/or behavior  PRELIMINARY DISCHARGE PLAN: Outpatient therapy  PATIENT/FAMILY INVOLVEMENT: This treatment plan has been presented to and reviewed with the patient, Justin [name].  The patient and family have been given the opportunity to ask questions and make suggestions.  Milbert Coulter, RN 06/12/2017, 9:21 PM

## 2017-06-12 NOTE — ED Notes (Signed)
Patient left the unit ambulatory and in stable condition. Belongings given back to patient.

## 2017-06-12 NOTE — Progress Notes (Signed)
TTS received a phone call from [employer] who inquired about the pt's social security number in order to verify his insurance. TTS attempted to contact registration at [phone] to obtain SS number but no answer. Desiree with [employer] requested a follow up call at [phone] once the pt's social security number has been obtained.   Lind Covert, MSW, LCSW Therapeutic Triage Specialist  [phone]

## 2017-06-12 NOTE — ED Notes (Signed)
Pt sister Enid Derry brought [place] paperwork, paperwork placed in paper chart.

## 2017-06-12 NOTE — ED Notes (Signed)
Ortho tech called for ordered short arm cast.

## 2017-06-12 NOTE — ED Notes (Signed)
EDP at bedside to assess pt hand.

## 2017-06-12 NOTE — ED Notes (Signed)
EKG given to EDP for review. No new orders given.

## 2017-06-12 NOTE — Progress Notes (Signed)
Justin [name] is a 71 y.o. male voluntarily admitted for SI with no plan from [facility].  Recent stressors include loss of wife a year and a half ago and losing dog within last 2 weeks.Taking Xanax at home as well as other medicines, started on ativan protocol here. Denies AVH.   Recent fall at home right forearm hairline fracture, arm casted, made a high fall risk.  Medical and surgical history reviewed and noted below.  Basic search of patient completed with skin check completed revealed abrasion on left arm, open (bandaged), also right leg abrasion scabbed, and some bruises on bilateral arms.  Belongings reviewed and noted on belongings record.  Oriented to unit and rules, consents and treatment agreement reviewed with patient and signed.  Allergies  Allergen Reactions  . Ambien [Zolpidem Tartrate] Other (See Comments)    Blackout, memory issues  . Seroquel [Quetiapine]     SEVERE NIGHTMARES, SLEEPWALK AND NIGHT DRIVE WITH NO RECOLLECTION UPON WAKENING   Past Medical History:  Diagnosis Date  . Anxiety   . Arthritis   . Asthma   . Bipolar disorder ([place])   . CAD S/P percutaneous coronary stenting   . CHF (congestive heart failure) ([place])   . Chicken pox   . Colon polyps   . Depression   . 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 Grief   . Hx of adenomatous colonic polyps 06/22/2016  . Hyperlipidemia   . Hypertension   . Migraines   . NSTEMI (non-ST elevated myocardial infarction) ([place])   . PTSD (post-traumatic stress disorder)    Past Surgical History:  Procedure Laterality Date  . CERVICAL DISCECTOMY    . COLONOSCOPY    . ELBOW SURGERY Left   . ELBOW SURGERY Right   . INGUINAL HERNIA REPAIR Bilateral    1996, 1997  . KNEE ARTHROSCOPY Right   . KNEE CARTILAGE SURGERY Left   . NASAL SEPTUM SURGERY    . STENT PLACEMENT VASCULAR ([place] HX)  09/02/2010  . TONSILLECTOMY

## 2017-06-13 DIAGNOSIS — F314 Bipolar disorder, current episode depressed, severe, without psychotic features: Principal | ICD-10-CM

## 2017-06-13 MED ORDER — TRAZODONE HCL 100 MG PO TABS
100.0000 mg | ORAL_TABLET | Freq: Every evening | ORAL | Status: DC | PRN
  Administered 2017-06-13: 100 mg via ORAL

## 2017-06-13 MED ORDER — TRAZODONE HCL 100 MG PO TABS: 100.0000 mg | ORAL_TABLET | Freq: Every day | ORAL | Status: DC

## 2017-06-13 MED ORDER — TRAZODONE HCL 100 MG PO TABS
ORAL_TABLET | ORAL | Status: AC
  Filled 2017-06-13: qty 1

## 2017-06-13 MED ORDER — TRAZODONE HCL 50 MG PO TABS
50.0000 mg | ORAL_TABLET | Freq: Every evening | ORAL | Status: DC | PRN
  Administered 2017-06-13 – 2017-06-14 (×2): 50 mg via ORAL
  Filled 2017-06-13 (×8): qty 1

## 2017-06-13 NOTE — Plan of Care (Signed)
Problem: Self-Concept: Goal: Level of anxiety will decrease Outcome: Progressing Pt stated he was better today and was able to get out and be around people and not be withdrawn to his room
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
## 2017-06-13 NOTE — Progress Notes (Signed)
Adult Psychoeducational Group Note  Date:  06/13/2017 Time:  9:24 PM  Group Topic/Focus:  Wrap-Up Group:   The focus of this group is to help patients review their daily goal of treatment and discuss progress on daily workbooks.  Participation Level:  Active  Participation Quality:  Attentive  Affect:  Appropriate  Cognitive:  Alert  Insight: Appropriate  Engagement in Group:  Engaged  Modes of Intervention:  Discussion  Additional Comments:  Pt stated that today was a productive day. He was able to voice what has been bothering him and that made him feel a lot better. He also had a good visit with his family.   Wynelle Fanny R 06/13/2017, 9:24 PM
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
## 2017-06-13 NOTE — Progress Notes (Signed)
Recreation Therapy Notes  Animal-Assisted Activity (AAA) Program Checklist/Progress Notes Patient Eligibility Criteria Checklist & Daily Group note for Rec TxIntervention  Date: 10.23.2018 Time: 2:45pm Location: Adult Unit  AAA/T Program Assumption of Risk Form signed by Patient/ or Parent Legal Guardian Yes  Patient is free of allergies or sever asthma Yes  Patient reports no fear of animals Yes  Patient reports no history of cruelty to animals Yes  Patient understands his/her participation is voluntary Yes  Patient washes hands before animal contact Yes  Patient washes hands after animal contact Yes  Behavioral Response: Appropriate   Education:Hand Washing, Appropriate Animal Interaction   Education Outcome: Acknowledges education.   Clinical Observations/Feedback: Patient attended session and interacted appropriately with therapy dog and peers.   Laureen Ochs Alazia Crocket, LRT/CTRS         Lane Hacker 06/13/2017 3:47 PM

## 2017-06-13 NOTE — BHH Group Notes (Signed)
The focus of this group is to educate the patient on the purpose and policies of crisis stabilization and provide a format to answer questions about their admission.  The group details unit policies and expectations of patients while admitted.  Patient attended 0900 nurse education orientation group this morning.  Patient actively participated and had appropriate affect.  Patient is alert.  Patient has appropriate insight and appropriate engagement.  Today patient will work on 3 goals for insight.

## 2017-06-13 NOTE — Progress Notes (Signed)
D: Pt stated he felt better, less depressed, goal today was to get out and mingle more with his peers that he stated he accomplished and got chance to talk with his son. Pt appears to have good insight into his situation, pt still feel like he was wronged by not being able to say good-bye to his wife before she past away A: Pt was offered support and encouragement. Pt was given scheduled medications. Pt was encourage to attend groups. Q 15 minute checks were done for safety.   R:Pt attends groups and interacts well with peers and staff. Pt is taking medication. Pt has no complaints.Pt receptive to treatment and safety maintained on unit.

## 2017-06-13 NOTE — Progress Notes (Signed)
DAR NOTE: Pt present with flat affect and depressed mood in the unit. Pt has been visible in the milieu interacting with peers and staff. Pt denies physical pain, took all his meds as scheduled. As per self inventory, pt had a poor night sleep, fair appetite, low energy, and poor concentration. Pt rate depression at 6, hopeless ness at 6, and anxiety at 0. Pt's safety ensured with 15 minute and environmental checks. Pt currently denies SI/HI and A/V hallucinations. Pt verbally agrees to seek staff if SI/HI or A/VH occurs and to consult with staff before acting on these thoughts. Will continue POC.

## 2017-06-13 NOTE — BHH Group Notes (Signed)
LCSW Group Therapy Note  06/13/2017 1:15pm  Type of Therapy/Topic:  Group Therapy:  Feelings about Diagnosis  Participation Level:  Minimal   Description of Group:   This group will allow patients to explore their thoughts and feelings about diagnoses they have received. Patients will be guided to explore their level of understanding and acceptance of these diagnoses. Facilitator will encourage patients to process their thoughts and feelings about the reactions of others to their diagnosis and will guide patients in identifying ways to discuss their diagnosis with significant others in their lives. This group will be process-oriented, with patients participating in exploration of their own experiences, giving and receiving support, and processing challenge from other group members.   Therapeutic Goals: 1. Patient will demonstrate understanding of diagnosis as evidenced by identifying two or more symptoms of the disorder 2. Patient will be able to express two feelings regarding the diagnosis 3. Patient will demonstrate their ability to communicate their needs through discussion and/or role play    Therapeutic Modalities:   Cognitive Behavioral Therapy Brief Therapy Feelings Identification    Gladstone Lighter, LCSW 06/13/2017 4:36 PM

## 2017-06-13 NOTE — Progress Notes (Signed)
Patient ID: Justin [name], male   DOB: [birth year], 71 y.o.   MRN: [id number] Pt visible in the milieu.  Interacting appropriately with staff and peers.  Attending and participating in groups per pt.  Pt discussed his relationship with his son and stated he wished it was better.  He also talked about the loss of his wife and dog.  Support and encouragement provided.  Pt receptive.  Fifteen minute checks in progress for patient safety.  Pt safe on unit.

## 2017-06-13 NOTE — BHH Suicide Risk Assessment (Signed)
[facility] Admission Suicide Risk Assessment   Nursing information obtained from:   patient and chart Demographic factors:   71 year old , widowed, lives alone, retired  Current Mental Status:   see below Loss Factors:   widowed, social isolation, pet dog recently died  Historical Factors:   bipolar disorder Risk Reduction Factors:   resilience   Total Time spent with patient: 45 minutes Principal Problem: Bipolar Disorder, Depression Diagnosis:   Patient Active Problem List   Diagnosis Date Noted  . Bipolar affective disorder, depressed, severe ([place]) [F31.4] 06/12/2017  . Benzodiazepine withdrawal with complication ([place]) [H63.149] 06/10/2017  . Nail lesion [L60.9] 02/25/2017  . Hx of adenomatous colonic polyps [Z86.010] 06/22/2016  . Memory changes [R41.3] 06/09/2016  . CAD S/P percutaneous coronary stenting [I25.10, Z98.61]   . Right knee pain [M25.561] 01/01/2016  . Olecranon bursitis of right elbow [M70.21] 12/10/2015  . Bipolar I disorder ([place]) [F31.9] 06/26/2015  . Routine general medical examination at a health care facility [Z00.00] 03/26/2015  . Medicare annual wellness visit, subsequent [Z00.00] 03/26/2015  . Rash and nonspecific skin eruption [R21] 03/13/2015  . Generalized anxiety disorder [F41.1] 12/23/2014  . Low back pain [M54.5] 12/23/2014  . Essential hypertension [I10] 12/23/2014     Continued Clinical Symptoms:  Alcohol Use Disorder Identification Test Final Score (AUDIT): 0 The "Alcohol Use Disorders Identification Test", Guidelines for Use in Primary Care, Second Edition.  World Pharmacologist [facility]). Score between 0-7:  no or low risk or alcohol related problems. Score between 8-15:  moderate risk of alcohol related problems. Score between 16-19:  high risk of alcohol related problems. Score 20 or above:  warrants further diagnostic evaluation for alcohol dependence and treatment.   CLINICAL FACTORS:  71 year old male, widowed x one and a half years, pet  dog died recently. Presented due to worsening depression, suicidal ideations, and brief hallucinatory /confusional state ( delirium?) which he attributes to sleep deprivation and poor PO intake. At this time remains depressed but is not suicidal and has no current hallucinations. No current delirium, and is fully alert, attentive and oriented x 3     Psychiatric Specialty Exam: Physical Exam  ROS  Blood pressure 135/78, pulse 79, temperature (!) 97.4 F (36.3 C), temperature source Oral, resp. rate 16, height 5\' 9"  (1.753 m), weight 78 kg (172 lb), SpO2 100 %.Body mass index is 25.4 kg/m.  See admit note MSE    COGNITIVE FEATURES THAT CONTRIBUTE TO RISK:  Closed-mindedness and Loss of executive function    SUICIDE RISK:   Moderate:  Frequent suicidal ideation with limited intensity, and duration, some specificity in terms of plans, no associated intent, good self-control, limited dysphoria/symptomatology, some risk factors present, and identifiable protective factors, including available and accessible social support.  PLAN OF CARE: Patient will be admitted to inpatient psychiatric unit for stabilization and safety. Will provide and encourage milieu participation. Provide medication management and maked adjustments as needed.  Will follow daily.    I certify that inpatient services furnished can reasonably be expected to improve the patient's condition.   Jenne Campus, MD 06/13/2017, 2:29 PM

## 2017-06-14 ENCOUNTER — Ambulatory Visit (HOSPITAL_COMMUNITY): Payer: Self-pay | Admitting: Licensed Clinical Social Worker

## 2017-06-14 ENCOUNTER — Telehealth: Payer: Self-pay

## 2017-06-14 LAB — TSH: TSH: 1.189 u[IU]/mL (ref 0.350–4.500)

## 2017-06-14 LAB — LIPID PANEL
Cholesterol: 150 mg/dL (ref 0–200)
HDL: 61 mg/dL (ref >40)
LDL CALC: 66 mg/dL (ref 0–99)
TRIGLYCERIDES: 116 mg/dL (ref <150)
Total CHOL/HDL Ratio: 2.5 RATIO
VLDL: 23 mg/dL (ref 0–40)

## 2017-06-14 LAB — VITAMIN B12: VITAMIN B 12: 326 pg/mL (ref 180–914)

## 2017-06-14 LAB — FOLATE: Folate: 39.3 ng/mL (ref >5.9)

## 2017-06-14 NOTE — Progress Notes (Signed)
DAR NOTE: Pt present with flat affect and depressed mood in the unit. Pt has been in the dayroom with peers and staff. Pt denies physical pain, took all his meds as scheduled. As per self inventory, pt had a fair night sleep, fair appetite, low energy, and poor concentration. Pt rate depression at 7, hopeless ness at 03, and anxiety at3 Pt's safety ensured with 15 minute and environmental checks. Pt currently denies SI/HI and A/V hallucinations. Pt verbally agrees to seek staff if SI/HI or A/VH occurs and to consult with staff before acting on these thoughts. Will continue POC.

## 2017-06-14 NOTE — Progress Notes (Signed)
Pt attend wrap up group. His day was a 8. His goal was to keep things positive while he is here as a patient.

## 2017-06-14 NOTE — Progress Notes (Signed)
[facility] MD Progress Note  06/14/2017 8:47 AM CALHOUN REICHARDT  MRN:  [id number] Subjective:  Patient reports he is feeling better. States he had visit from son yesterday which went well, and helped him feel better. Denies medication side effects at this time. Denies suicidal ideations.  Objective: I have discussed case with treatment team and have met with patient. Patient reports he is feeling better, less depressed.Denies any suicidal ideations. He remains vaguely constricted in affect, but is clearly improved, and did smile and even joke appropriately at times during session. Denies medication side effects, has been on Lamictal x 2 years without side effects and is tolerating Zyprexa trial well thus far . We have reviewed medication side effects, including risk of movement disorders, weight gain, sedation. ( Patient reports significant weight loss over recent months ) . We also reviewed potential increased risk of bleeding as patient is on Plavix for a history of CAD, S/P stenting. Visible in day room, going to groups.Behavior on unit in good control. Labs- Lipid Profile WNL, B12, Folate WNL, TSH WNL  Principal Problem:  Diagnosis:   Patient Active Problem List   Diagnosis Date Noted  . Bipolar affective disorder, depressed, severe (HCC) [F31.4] 06/12/2017  . Benzodiazepine withdrawal with complication (HCC) [F13.239] 06/10/2017  . Nail lesion [L60.9] 02/25/2017  . Hx of adenomatous colonic polyps [Z86.010] 06/22/2016  . Memory changes [R41.3] 06/09/2016  . CAD S/P percutaneous coronary stenting [I25.10, Z98.61]   . Right knee pain [M25.561] 01/01/2016  . Olecranon bursitis of right elbow [M70.21] 12/10/2015  . Bipolar I disorder (HCC) [F31.9] 06/26/2015  . Routine general medical examination at a health care facility [Z00.00] 03/26/2015  . Medicare annual wellness visit, subsequent [Z00.00] 03/26/2015  . Rash and nonspecific skin eruption [R21] 03/13/2015  . Generalized anxiety disorder  [F41.1] 12/23/2014  . Low back pain [M54.5] 12/23/2014  . Essential hypertension [I10] 12/23/2014   Total Time spent with patient: 20 minutes   Past Medical History:  Past Medical History:  Diagnosis Date  . Anxiety   . Arthritis   . Asthma   . Bipolar disorder (HCC)   . CAD S/P percutaneous coronary stenting   . CHF (congestive heart failure) (HCC)   . Chicken pox   . Colon polyps   . Depression   . 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HCC)   . Grief   . Hx of adenomatous colonic polyps 06/22/2016  . Hyperlipidemia   . Hypertension   . Migraines   . NSTEMI (non-ST elevated myocardial infarction) (HCC)   . PTSD (post-traumatic stress disorder)     Past Surgical History:  Procedure Laterality Date  . CERVICAL DISCECTOMY    . COLONOSCOPY    . ELBOW SURGERY Left   . ELBOW SURGERY Right   . INGUINAL HERNIA REPAIR Bilateral    1996, 1997  . KNEE ARTHROSCOPY Right   . KNEE CARTILAGE SURGERY Left   . NASAL SEPTUM SURGERY    . STENT PLACEMENT VASCULAR ([facility] HX)  09/02/2010  . TONSILLECTOMY     Family History:  Family History  Problem Relation Age of Onset  . Arthritis Mother   . Heart disease Mother   . Hypertension Mother   . Alzheimer's disease Mother   . Heart attack Mother   . Arthritis Father   . Hyperlipidemia Father   . Heart disease Father   . Stroke Father   . Hypertension Father   . Heart attack Father   . Multiple sclerosis Sister   . Diabetes  Sister   . Lung cancer Paternal Grandmother   . Diabetes Sister   . Lung cancer Maternal Aunt   . Lung cancer Paternal Aunt    Social History:  History  Alcohol Use No     History  Drug Use No    Social History   Social History  . Marital status: Widowed    Spouse name: N/A  . Number of children: 2  . Years of education: 12   Occupational History  . Retired    Social History Main Topics  . Smoking status: Never Smoker  . Smokeless tobacco: Never Used  . Alcohol use No  . Drug use: No  . Sexual activity:  Not Currently   Other Topics Concern  . None   Social History Narrative   Retired, widowed in 2017    1 son one daughter   2 caffeinated beverages daily no alcohol or tobacco   Fun: Work out in the yard.   Denies religious beliefs effecting healthcare.    Additional Social History:    Pain Medications: See MAR Prescriptions: See MAR Over the Counter: See MAR History of alcohol / drug use?: No history of alcohol / drug abuse  Sleep: Good  Appetite:  Good  Current Medications: Current Facility-Administered Medications  Medication Dose Route Frequency Provider Last Rate Last Dose  . acetaminophen (TYLENOL) tablet 650 mg  650 mg Oral Q4H PRN Patrecia Pour, NP   650 mg at 06/13/17 0013  . alum & mag hydroxide-simeth (MAALOX/MYLANTA) 200-200-20 MG/5ML suspension 30 mL  30 mL Oral Q6H PRN Patrecia Pour, NP      . clopidogrel (PLAVIX) tablet 75 mg  75 mg Oral Daily Patrecia Pour, NP   75 mg at 06/14/17 0830  . diltiazem (CARDIZEM CD) 24 hr capsule 180 mg  180 mg Oral Daily Patrecia Pour, NP   180 mg at 06/14/17 0830  . lamoTRIgine (LAMICTAL) tablet 250 mg  250 mg Oral Daily Patrecia Pour, NP   250 mg at 06/14/17 0830  . magnesium hydroxide (MILK OF MAGNESIA) suspension 30 mL  30 mL Oral Daily PRN Patrecia Pour, NP   30 mL at 06/14/17 0831  . multivitamin with minerals tablet 1 tablet  1 tablet Oral Daily Patrecia Pour, NP   1 tablet at 06/14/17 0830  . OLANZapine (ZYPREXA) tablet 10 mg  10 mg Oral QHS Patrecia Pour, NP   10 mg at 06/13/17 2224  . thiamine (VITAMIN B-1) tablet 100 mg  100 mg Oral Daily Patrecia Pour, NP   100 mg at 06/14/17 0830  . traZODone (DESYREL) tablet 50 mg  50 mg Oral QHS,MR X 1 Money, Lowry Ram, FNP   50 mg at 06/13/17 2224    Lab Results:  Results for orders placed or performed during the hospital encounter of 06/12/17 (from the past 48 hour(s))  TSH     Status: None   Collection Time: 06/14/17  6:29 AM  Result Value Ref Range   TSH 1.189  0.350 - 4.500 uIU/mL    Comment: Performed by a 3rd Generation assay with a functional sensitivity of <=0.01 uIU/mL. Performed at [facility], [place] [address] [place], [place] [address]     Blood Alcohol level:  Lab Results  Component Value Date   ETH <10 40/34/7425    Metabolic Disorder Labs: No results found for: HGBA1C, MPG No results found for: PROLACTIN Lab Results  Component Value Date  CHOL 141 03/26/2015   TRIG 87.0 03/26/2015   HDL 68.50 03/26/2015   CHOLHDL 2 03/26/2015   VLDL 17.4 03/26/2015   LDLCALC 55 03/26/2015    Physical Findings: AIMS: Facial and Oral Movements Muscles of Facial Expression: None, normal Lips and Perioral Area: None, normal Jaw: None, normal Tongue: None, normal,Extremity Movements Upper (arms, wrists, hands, fingers): None, normal Lower (legs, knees, ankles, toes): None, normal, Trunk Movements Neck, shoulders, hips: None, normal, Overall Severity Severity of abnormal movements (highest score from questions above): None, normal Incapacitation due to abnormal movements: None, normal Patient's awareness of abnormal movements (rate only patient's report): No Awareness, Dental Status Current problems with teeth and/or dentures?: No Does patient usually wear dentures?: No  CIWA:    COWS:     Musculoskeletal: Strength & Muscle Tone: within normal limits Gait & Station: normal Patient leans: N/A  Psychiatric Specialty Exam: Physical Exam  ROS denies headache, no chest pain, no shortness of breath, no bleeding, no melenas  ( he is on Plavix for history of CAD/Stents) , reports some constipation.  Blood pressure 140/84, pulse 75, temperature (!) 97.4 F (36.3 C), temperature source Oral, resp. rate 16, height '5\' 9"'$  (1.753 m), weight 78 kg (172 lb), SpO2 100 %.Body mass index is 25.4 kg/m.  General Appearance: Well Groomed  Eye Contact:  Good  Speech:  Normal Rate  Volume:  Normal  Mood:  reports he feels better,  less depressed   Affect:  more reactive, fuller in range, but still vaguely constricted   Thought Process:  Linear and Descriptions of Associations: Intact  Orientation:  Other:  fully alert and attentive   Thought Content:  no hallucinations, no delusions, not internally preoccupied   Suicidal Thoughts:  No denies suicidal or self injurious ideations, denies homicidal ideations, contracts for safety on unit   Homicidal Thoughts:  No  Memory:  recent and remote grossly intact   Judgement:  Fair  Insight:  Fair  Psychomotor Activity:  Normal- improving, but states he still feels somewhat " sluggish"  Concentration:  Concentration: Good and Attention Span: NA  Recall:  Good  Fund of Knowledge:  Good  Language:  Good  Akathisia:  No  Handed:  Right  AIMS (if indicated):     Assets:  Communication Skills Desire for Improvement Resilience  ADL's:  Intact  Cognition:  WNL  Sleep:  Number of Hours: 6.5   Assessment - patient is reporting improving mood and does present with partially improved range of affect. Denies suicidal ideations at this time. Thus far tolerating medications well .   Treatment Plan Summary: Daily contact with patient to assess and evaluate symptoms and progress in treatment, Medication management, Plan inpatient treatment  and medications as below Encourage group and milieu participation to work on coping skills and symptom reduction Treatment Team working on disposition planning  Continue Zyprexa 10 mgrs QHS for mood disorder /bipolar disorder Continue Lamictal 250 mgrs QDAY for depression, mood disorder Continue Trazodone 50 mgrs QHS PRN for insomnia as needed  On Cardizem for HTN, and on Plavix for history of CAD/Stents.  Jenne Campus, MD 06/14/2017, 8:47 AM

## 2017-06-14 NOTE — [facility] Suicide Risk Assessment (Signed)
[facility] INPATIENT:  Family/Significant Other Suicide Prevention Education  Suicide Prevention Education:  Education Completed; Rayburn Go, Pt's sister [phone], has been identified by the patient as the family member/significant other with whom the patient will be residing, and identified as the person(s) who will aid the patient in the event of a mental health crisis (suicidal ideations/suicide attempt).  With written consent from the patient, the family member/significant other has been provided the following suicide prevention education, prior to the and/or following the discharge of the patient.  The suicide prevention education provided includes the following:  Suicide risk factors  Suicide prevention and interventions  National Suicide Hotline telephone number  [facility] assessment telephone number  [facility] Emergency Assistance [place] and/or Residential Mobile Crisis Unit telephone number  Request made of family/significant other to:  Remove weapons (e.g., guns, rifles, knives), all items previously/currently identified as safety concern.    Remove drugs/medications (over-the-counter, prescriptions, illicit drugs), all items previously/currently identified as a safety concern.  The family member/significant other verbalizes understanding of the suicide prevention education information provided.  The family member/significant other agrees to remove the items of safety concern listed above.  Pt's sister reports that the death of Pt's wife is the primary concern. She feels that grief counseling would be beneficial. She has agreed to take over Pt's medication distribution as Pt's memory has declined. She also reports that insomnia is an issue as well for the patient.   Gladstone Lighter 06/14/2017, 2:38 PM

## 2017-06-14 NOTE — Tx Team (Signed)
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 acetaminophen (TYLENOL) tablet 650 mg  650 mg Oral Q4H PRN Patrecia Pour, NP   650 mg at 06/13/17 0013  . alum & mag hydroxide-simeth (MAALOX/MYLANTA) 200-200-20 MG/5ML suspension 30 mL  30 mL Oral Q6H PRN Patrecia Pour, NP      . clopidogrel (PLAVIX) tablet 75 mg  75 mg Oral Daily Patrecia Pour, NP   75 mg at 06/14/17 0830  . diltiazem (CARDIZEM CD) 24 hr capsule 180 mg  180 mg Oral Daily Patrecia Pour, NP   180 mg at 06/14/17 0830  . lamoTRIgine (LAMICTAL) tablet 250 mg  250 mg Oral Daily Patrecia Pour, NP   250 mg at 06/14/17 0830  . magnesium hydroxide (MILK OF MAGNESIA) suspension 30 mL  30 mL Oral Daily PRN Patrecia Pour, NP   30 mL at 06/14/17 0831  . multivitamin with minerals tablet 1 tablet  1 tablet Oral Daily Patrecia Pour, NP   1 tablet at 06/14/17 0830  . OLANZapine (ZYPREXA) tablet 10 mg  10 mg Oral QHS Patrecia Pour, NP   10 mg at 06/13/17 2224  . thiamine (VITAMIN B-1) tablet 100 mg  100 mg Oral Daily Patrecia Pour, NP   100 mg at 06/14/17 0830  . traZODone (DESYREL) tablet 50 mg  50 mg Oral QHS,MR X 1 Money, Lowry Ram, FNP   50 mg at 06/13/17 2224    PTA Medications: Prescriptions Prior to Admission  Medication Sig Dispense Refill Last Dose  . CARTIA XT 180 MG 24 hr capsule Take 180 mg by mouth daily.    06/09/2017 at Unknown time  . clopidogrel (PLAVIX) 75 MG tablet Take 75 mg by mouth daily.   06/09/2017 at 900  . lamoTRIgine (LAMICTAL) 100 MG tablet Take 2.5 tablets (250 mg total) by mouth daily. 75 tablet 2 06/09/2017 at Unknown time  . LORazepam  (ATIVAN) 0.5 MG tablet Take 1 tablet (0.5 mg total) by mouth 2 (two) times daily. (Patient not taking: Reported on 06/09/2017) 60 tablet 1 Not Taking at Unknown time  . nitroGLYCERIN (NITROSTAT) 0.4 MG SL tablet    ON HAND  . OLANZapine (ZYPREXA) 10 MG tablet Take 10 mg by mouth at bedtime.   06/08/2017 at Unknown time  . OLANZapine (ZYPREXA) 15 MG tablet Take 1 tablet (15 mg total) by mouth at bedtime. (Patient not taking: Reported on 06/09/2017) 90 tablet 0 Not Taking at Unknown time  . OLANZapine (ZYPREXA) 7.5 MG tablet Take 7.5 mg by mouth at bedtime.   06/08/2017 at Unknown time  . rosuvastatin (CRESTOR) 20 MG tablet Take 20 mg by mouth daily.   06/09/2017 at Unknown time  . tiZANidine (ZANAFLEX) 4 MG tablet Take 1 tablet (4 mg total) by mouth every 6 (six) hours as needed for muscle spasms. (Patient not taking: Reported on 06/09/2017) 60 tablet 0 Not Taking at Unknown time  . traMADol (ULTRAM) 50 MG tablet Take 1 tablet (50 mg total) by mouth every 6 (six) hours as needed. (Patient not taking: Reported on 06/09/2017) 40 tablet 0 Not Taking at Unknown time  . traZODone (DESYREL) 50 MG tablet Take 1  tablet (50 mg total) by mouth at bedtime. 30 tablet 1 Past Week at Unknown time    Treatment Modalities: Medication Management, Group therapy, Case management,  1 to 1 session with clinician, Psychoeducation, Recreational therapy.  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  Long Term Goal(s): Improvement in symptoms so as ready for discharge  Short Term Goals: Ability to identify changes in lifestyle to reduce recurrence of condition will improve Ability to identify triggers associated with substance abuse/mental health  issues will improve Ability to verbalize feelings will improve Ability to disclose and discuss suicidal ideas Ability to demonstrate self-control will improve Ability to identify and develop effective coping behaviors will improve Ability to maintain clinical measurements within normal limits will improve  Medication Management: Evaluate patient's response, side effects, and tolerance of medication regimen.  Therapeutic Interventions: 1 to 1 sessions, Unit Group sessions and Medication administration.  Evaluation of Outcomes: Not Met  Physician Treatment Plan for Secondary Diagnosis: Active Problems:   Bipolar affective disorder, depressed, severe ([place])   Long Term Goal(s): Improvement in symptoms so as ready for discharge  Short Term Goals: Ability to identify changes in lifestyle to reduce recurrence of condition will improve Ability to identify triggers associated with substance abuse/mental health issues will improve Ability to verbalize feelings will improve Ability to disclose and discuss suicidal ideas Ability to demonstrate self-control will improve Ability to identify and develop effective coping behaviors will improve Ability to maintain clinical measurements within normal limits will improve  Medication Management: Evaluate patient's response, side effects, and tolerance of medication regimen.  Therapeutic Interventions: 1 to 1 sessions, Unit Group sessions and Medication administration.  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 RN will report any adverse and/or side effects to prescribing provider.  Therapeutic Interventions: 1 on 1 counseling sessions, Psychoeducation, Medication administration, Evaluate responses to treatment, Monitor vital signs and CBGs as ordered, Perform/monitor CIWA, COWS, AIMS and Fall Risk screenings as ordered, Perform wound care treatments as ordered.  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  Short Term Goals: Engage patient in aftercare planning with referrals and resources and Increase skills for wellness and recovery  Therapeutic Interventions: Assess for all discharge needs, 1 to 1 time with Social worker, Explore available resources and support systems, Assess for adequacy in community support network, Educate family and significant other(s) on suicide prevention, Complete Psychosocial Assessment, Interpersonal group therapy.  Evaluation of Outcomes: Not Met   Progress in Treatment: Attending groups: Yes Participating in groups: Minimally Taking medication as prescribed: Yes, MD continues to assess for medication changes as needed Toleration medication: Yes, no side effects reported at this time Family/Significant other contact made: Yes  with sister Patient understands diagnosis: Continuing to assess Discussing patient identified problems/goals with staff: Yes Medical problems stabilized or resolved: Yes Denies suicidal/homicidal ideation: No Issues/concerns per patient self-inventory: None Other: N/A  New problem(s) identified: None identified at this time.   New Short Term/Long Term Goal(s): "work on my mood"  Discharge Plan or Barriers: Pt will return home and follow-up with outpatient services and  will start the IOP at [facility] for Continuation of Hospitalization: Anxiety Depression Medication stabilization Suicidal ideation  Estimated Length of Stay: 3-5 days  Attendees: Patient:  06/14/2017  2:29 PM  Physician: Dr. Parke Poisson, MD 06/14/2017  2:29 PM  Nursing: Opal Sidles, RN 06/14/2017  2:29 PM  RN Care Manager: Lars Pinks, RN 06/14/2017  2:29 PM  Social Worker: Adriana Reams, LCSW 06/14/2017  2:29 PM  Recreational Therapist:  06/14/2017  2:29 PM  Other: Lindell Spar, NP; Marvia Pickles, NP 06/14/2017  2:29 PM  Other:  06/14/2017  2:29 PM  Other: 06/14/2017  2:29 PM    Scribe for Treatment Team: Gladstone Lighter, LCSW 06/14/2017 2:29 PM

## 2017-06-14 NOTE — Telephone Encounter (Signed)
Pt is on TCM list. Pt is currently in [facility] admitted.

## 2017-06-14 NOTE — Progress Notes (Signed)
Recreation Therapy Notes  Date: 06/14/17 Time: 0930 Location: 300 Hall Dayroom  Group Topic: Stress Management  Goal Area(s) Addresses:  Patient will verbalize importance of using healthy stress management.  Patient will identify positive emotions associated with healthy stress management.   Behavioral Response: Engaged  Intervention: Stress Management  Activity :  Guided Imagery.  LRT introduced the stress management technique of guided imagery.  Patients were to follow along as LRT read script to engage in the activity.  Education:  Stress Management, Discharge Planning.   Education Outcome: Acknowledges edcuation/In group clarification offered/Needs additional education  Clinical Observations/Feedback:  Pt attended group.    Victorino Sparrow, LRT/CTRS         Victorino Sparrow A 06/14/2017 11:42 AM

## 2017-06-15 LAB — HEMOGLOBIN A1C
Hgb A1c MFr Bld: 5.4 % (ref 4.8–5.6)
MEAN PLASMA GLUCOSE: 108 mg/dL

## 2017-06-15 LAB — PROLACTIN: Prolactin: 26 ng/mL — ABNORMAL HIGH (ref 4.0–15.2)

## 2017-06-15 LAB — VITAMIN D 25 HYDROXY (VIT D DEFICIENCY, FRACTURES): VIT D 25 HYDROXY: 46.2 ng/mL (ref 30.0–100.0)

## 2017-06-15 MED ORDER — OLANZAPINE 10 MG PO TABS: 10.0000 mg | ORAL_TABLET | Freq: Every day | ORAL | 0 refills | Status: DC

## 2017-06-15 MED ORDER — CLOPIDOGREL BISULFATE 75 MG PO TABS
75.0000 mg | ORAL_TABLET | Freq: Every day | ORAL | 0 refills | Status: DC
Start: 2017-06-16 — End: 2018-05-28

## 2017-06-15 MED ORDER — LAMOTRIGINE 100 MG PO TABS: 250.0000 mg | ORAL_TABLET | Freq: Every day | ORAL | 0 refills | Status: DC

## 2017-06-15 MED ORDER — TRAZODONE HCL 50 MG PO TABS: 50.0000 mg | ORAL_TABLET | Freq: Every evening | ORAL | 0 refills | Status: DC | PRN

## 2017-06-15 MED ORDER — DILTIAZEM HCL ER COATED BEADS 180 MG PO CP24: 180.0000 mg | ORAL_CAPSULE | Freq: Every day | ORAL | 0 refills | Status: DC

## 2017-06-15 NOTE — Progress Notes (Signed)
  [facility] Adult Case Management Discharge Plan :  Will you be returning to the same living situation after discharge:  Yes,  pt returning home. At discharge, do you have transportation home?: Yes,  pt has access to transportation. Do you have the ability to pay for your medications: Yes,  pt has insurance.  Release of information consent forms completed and in the chart;  Patient's signature needed at discharge.  Patient to Follow up at: Follow-up Information    [facility] Follow up on 06/20/2017.   Specialty:  Behavioral Health Why:  at 9:00am for your initial assessment for the intensive outpatient program. Contact information: [facility] [phone] [place] [phone]          Next level of care provider has access to [place] and Suicide Prevention discussed: Yes,  with pt and with pt's sister.  Have you used any form of tobacco in the last 30 days? (Cigarettes, Smokeless Tobacco, Cigars, and/or Pipes): No  Has patient been referred to the Quitline?: N/A patient is not a smoker  Patient has been referred for addiction treatment: [place], MSW, LCSWA 06/15/2017, 4:10 PM

## 2017-06-15 NOTE — Plan of Care (Signed)
Problem: Activity: Goal: Sleeping patterns will improve Outcome: Progressing Slept 6.5 hours last night according to flowsheet.    

## 2017-06-15 NOTE — Progress Notes (Signed)
Pt discharged home with his sister. Pt was ambulatory,  stable and appreciative at that time. All papers and prescriptions were given and valuables returned. Verbal understanding expressed. Denies SI/HI and A/VH. Pt given opportunity to express concerns and ask questions.

## 2017-06-15 NOTE — Progress Notes (Signed)
D: Pt was in the dayroom upon initial approach.  Pt presents with depressed affect and mood.  His goal is to "try to stay positive, have positive thoughts."  He reports he is discharging tomorrow and he feels safe to do so.  He discussed how he lost his wife last year and recently lost his dog.  Pt denies SI/HI, denies hallucinations, reports R hand pain of 5/10.  Pt has been visible in milieu interacting with peers and staff appropriately.  Pt attended evening group.    A: Introduced self to pt.  Actively listened to pt and offered support and encouragement. Medications administered per order.  PRN medication administered for pain.  Q15 minute safety checks maintained.  R: Pt is safe on the unit.  Pt is compliant with medications.  Pt verbally contracts for safety.  Will continue to monitor and assess.

## 2017-06-15 NOTE — BHH Suicide Risk Assessment (Addendum)
[facility] Discharge Suicide Risk Assessment   Principal Problem: Bipolar affective disorder, depressed, severe ([place]) Discharge Diagnoses:  Patient Active Problem List   Diagnosis Date Noted  . Bipolar affective disorder, depressed, severe ([place]) [F31.4] 06/12/2017  . Benzodiazepine withdrawal with complication ([place]) [C58.527] 06/10/2017  . Nail lesion [L60.9] 02/25/2017  . Hx of adenomatous colonic polyps [Z86.010] 06/22/2016  . Memory changes [R41.3] 06/09/2016  . CAD S/P percutaneous coronary stenting [I25.10, Z98.61]   . Right knee pain [M25.561] 01/01/2016  . Olecranon bursitis of right elbow [M70.21] 12/10/2015  . Bipolar I disorder ([place]) [F31.9] 06/26/2015  . Routine general medical examination at a health care facility [Z00.00] 03/26/2015  . Medicare annual wellness visit, subsequent [Z00.00] 03/26/2015  . Rash and nonspecific skin eruption [R21] 03/13/2015  . Generalized anxiety disorder [F41.1] 12/23/2014  . Low back pain [M54.5] 12/23/2014  . Essential hypertension [I10] 12/23/2014    Total Time spent with patient: 30 minutes  Musculoskeletal: Strength & Muscle Tone: within normal limits Gait & Station: normal Patient leans: N/A  Psychiatric Specialty Exam: ROS denies headache, no chest pain , no shortness of breath, no vomiting   Blood pressure 100/68, pulse 85, temperature 98.5 F (36.9 C), temperature source Oral, resp. rate 18, height 5\' 9"  (1.753 m), weight 78 kg (172 lb), SpO2 100 %.Body mass index is 25.4 kg/m.  General Appearance: Well Groomed  Eye Contact::  Good  Speech:  Normal Rate409  Volume:  Normal  Mood:  reports he feels better, less depressed  Affect:  more reactive, smiles at times appropriately , fuller in range   Thought Process:  Linear and Descriptions of Associations: Intact  Orientation:  Full (Time, Place, and Person)  Thought Content:  no hallucinations, no delusions, not internally preoccupied   Suicidal Thoughts:  No denies suicidal  ideations at this time, denies self injurious ideations, more future oriented, and identifies love for son and grandchildren as a protective factor against suicide   Homicidal Thoughts:  No  Memory:  recent and remote grossly intact   Judgement:  Other:  improving   Insight:  improving   Psychomotor Activity:  Normal  Concentration:  Good  Recall:  Good  Fund of Knowledge:Good  Language: Good  Akathisia:  Negative  Handed:  Right  AIMS (if indicated):   no abnormal or involuntary movements noted or reported   Assets:  Communication Skills Desire for Improvement Resilience  Sleep:  Number of Hours: 6.75  Cognition: WNL  ADL's:  Intact   Mental Status Per Nursing Assessment::   On Admission:     Demographic Factors:  71 year old male, lives alone, widowed   Loss Factors: Widowed/ recent death of pet dog   Historical Factors: History of Bipolar Disorder , one suicide attempt in 1997   Risk Reduction Factors:   Sense of responsibility to family, Positive social support and Positive coping skills or problem solving skills  Continued Clinical Symptoms:  At this time patient is alert, attentive, well related, mood is improved compared to admission, states he feels better, affect is more reactive, smiles at times appropriately, speech normal and good eye contact at this time, no thought disorder, no suicidal ideations, no homicidal ideations, no hallucinations , no delusions , future oriented at this time. Behavior on unit in good control, calm, pleasant on approach. Denies medication side effects. * Suicide risk and safety plan have been reviewed. He states he plans to be more socially active and to spend more time out of  the house . 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 States his adult son has been very supportive, and lives a block away,  states he plans to visit son more and can access son if he is feeling worse or more depressed. He also mentions love for grandchildren as a protective factor against suicide.In addition, states he has a sister who has been supportive and who has offered to keep his medications for him,provide him with the medications for the week in a pill box, and generally provide support . States he has no access to firearms.   Cognitive Features That Contribute To Risk:  No gross cognitive deficits noted upon discharge. Is alert , attentive, and oriented x 3   Suicide Risk:  Mild:  Suicidal ideation of limited frequency, intensity, duration, and specificity.  There are no identifiable plans, no associated intent, mild dysphoria and related symptoms, good self-control (both objective and subjective assessment), few other risk factors, and identifiable protective factors, including available and accessible social support.  Follow-up Information    BEHAVIORAL HEALTH INTENSIVE PSYCH Follow up on 06/20/2017.   Specialty:  Behavioral Health Why:  at 9:00am for your initial assessment for the intensive outpatient program. Contact information: [place] [address] [facility] Follow up.   Specialty:  [facility] information: [place] [phone]          Plan Of Care/Follow-up recommendations:  Activity:  as tolerated Diet:  regular Tests:  NA Other:  see below  Patient is expressing readiness for discharge, no current grounds for involuntary commitment  He is leaving unit in good spirits  Family member will pick him up later today Plans to return home Plans to participate in IOP as above  He has an established PCP for medical issues as needed  Plans to follow up with orthopedist regarding R UE fracture . Jenne Campus,  MD 06/15/2017, 10:46 AM

## 2017-06-15 NOTE — Discharge Summary (Signed)
Physician Discharge Summary Note  Patient:  Justin [name] is an 71 y.o., male MRN:  [id number] DOB:  [birth year] Patient phone:  [phone] (home)  Patient address:   [address] [place] [address],  Total Time spent with patient: 20 minutes  Date of Admission:  06/12/2017 Date of Discharge: 06/15/17   Reason for Admission:  Worsening depression and SI  Principal Problem: Bipolar affective disorder, depressed, severe [facility]) Discharge Diagnoses: Patient Active Problem List   Diagnosis Date Noted  . Bipolar affective disorder, depressed, severe ([place]) [F31.4] 06/12/2017  . Benzodiazepine withdrawal with complication ([place]) [E95.284] 06/10/2017  . Nail lesion [L60.9] 02/25/2017  . Hx of adenomatous colonic polyps [Z86.010] 06/22/2016  . Memory changes [R41.3] 06/09/2016  . CAD S/P percutaneous coronary stenting [I25.10, Z98.61]   . Right knee pain [M25.561] 01/01/2016  . Olecranon bursitis of right elbow [M70.21] 12/10/2015  . Bipolar I disorder ([place]) [F31.9] 06/26/2015  . Routine general medical examination at a health care facility [Z00.00] 03/26/2015  . Medicare annual wellness visit, subsequent [Z00.00] 03/26/2015  . Rash and nonspecific skin eruption [R21] 03/13/2015  . Generalized anxiety disorder [F41.1] 12/23/2014  . Low back pain [M54.5] 12/23/2014  . Essential hypertension [I10] 12/23/2014    Past Psychiatric History: reports history of Bipolar Disorder. Does report episodes of increased energy, increased agitation, rapid speech, increased impulsivity, increased spending , which normally last 2 days or so. Denies prior history of psychosis. One suicide attempt in Nov 19, 1995, related to death of a sister.  History of prior psychiatric admissions, most recently 11 years ago  Past Medical History:  Past Medical History:  Diagnosis Date  . Anxiety   . Arthritis   . Asthma   . Bipolar disorder ([place])   . CAD S/P percutaneous coronary stenting   . CHF (congestive heart  failure) ([place])   . Chicken pox   . Colon polyps   . Depression   . 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 Grief   . Hx of adenomatous colonic polyps 06/22/2016  . Hyperlipidemia   . Hypertension   . Migraines   . NSTEMI (non-ST elevated myocardial infarction) ([place])   . PTSD (post-traumatic stress disorder)     Past Surgical History:  Procedure Laterality Date  . CERVICAL DISCECTOMY    . COLONOSCOPY    . ELBOW SURGERY Left   . ELBOW SURGERY Right   . INGUINAL HERNIA REPAIR Bilateral    1996, 1997  . KNEE ARTHROSCOPY Right   . KNEE CARTILAGE SURGERY Left   . NASAL SEPTUM SURGERY    . STENT PLACEMENT VASCULAR ([place] [facility])  09/02/2010  . TONSILLECTOMY     Family History:  Family History  Problem Relation Age of Onset  . Arthritis Mother   . Heart disease Mother   . Hypertension Mother   . Alzheimer's disease Mother   . Heart attack Mother   . Arthritis Father   . Hyperlipidemia Father   . Heart disease Father   . Stroke Father   . Hypertension Father   . Heart attack Father   . Multiple sclerosis Sister   . Diabetes Sister   . Lung cancer Paternal Grandmother   . Diabetes Sister   . Lung cancer Maternal Aunt   . Lung cancer Paternal Aunt    Family Psychiatric  History: history of depression in family, brother committed suicide Social History:  History  Alcohol Use No     History  Drug Use No    Social History  Social History  . Marital status: Widowed    Spouse name: N/A  . Number of children: 2  . Years of education: 12   Occupational History  . Retired    Social History Main Topics  . Smoking status: Never Smoker  . Smokeless tobacco: Never Used  . Alcohol use No  . Drug use: No  . Sexual activity: Not Currently   Other Topics Concern  . None   Social History Narrative   Retired, widowed in 2017    1 son one daughter   2 caffeinated beverages daily no alcohol or tobacco   Fun: Work out in the yard.   Denies religious beliefs effecting healthcare.      Hospital Course:   06/09/17 ED [facility] Counselor Assessment: 71 y.o. male who presents for increasing suicidal ideation without a plan following the recent passing of his dog. Patient additionally notes recent increasing forgetfulness, lack of sleep, and building feelings of stress and sadness that he attributes to him feeling suicidal. Patient points out that after his son found out that he had had so little sleep that if he did not check himself into the hospital that he would involuntarily commit him. Patient does verbalize that he has emotional support from his younger sister who lives here in [place]. Patient is also seeing Arfeen MD for out-paitent psychiatry and has prior diagnosis of Bipolar I, and insomnia that he feels are well managed with medication until this recent increasing episode. His appetite is fair. He denies any aggression, rage or any self abusive behavior. He's taking Xanax, trazodone, Zyprexa and Lamictal. He denies drinking alcohol or using any illegal substances. He verbalizes that his goal for this admission would be to help him get some rest and decrease his recently increasing feelings of depression and sadness. Patient is oriented to place only and denies any H/I or AVH.  06/13/17 [place] MD Assessment: 71 year old male . Lives alone, widowed  1.5 years ago . Describes he has a long history of depression but after her passing had been feeling more depressed, bereaved. More recently he had to  put his pet dog down. Following the above he has felt more depressed, more sad, and developed suicidal ideations, as well as significant neuro-vegetative symptoms of depression as below. Denies any actual plan or intention and states " I just wanted to fall asleep and never wake up again". Reports fleeting thoughts of shooting self as well ,but states he does not have access to a firearm. Also reports vague auditory hallucinations, and has heard his dog scratching its ear or dog's collar  making noise over recent days ( not today). Patient reports he called 911, reporting worsening depression and reporting seeing dogs in the house. On that day he also sustained a fall , leading to thumb fracture. His memories of this event are fragmented and states he does not remember events of that day well . He attributes this to sleep deprivation and poor PO intake, denies alcohol abuse .   Patient remained on the [facility] unit for 2 days, but remained in the hospital for 6 days total. Patient stabilized with medications and therapy. Patient was continued on Lamictal and titrated to 250 mg Daily and Zyprexa 10 mg QHS. Patient was also continued on his Cardizem and Plavix as prescribed by his PCP. Patient has shown improvement with improved sleep, mood, affect, appetite, and interaction. Patient has been attending group and participating. Patient has been compliant with treatment and medications.  Patient will be discharged home and states he will plan to continue following up with [facility] and Dr. Adele Schilder. He has denied any SI/HI/AVH. He is provided prescriptions for his medications upon discharge.   Physical Findings: AIMS: Facial and Oral Movements Muscles of Facial Expression: None, normal Lips and Perioral Area: None, normal Jaw: None, normal Tongue: None, normal,Extremity Movements Upper (arms, wrists, hands, fingers): None, normal Lower (legs, knees, ankles, toes): None, normal, Trunk Movements Neck, shoulders, hips: None, normal, Overall Severity Severity of abnormal movements (highest score from questions above): None, normal Incapacitation due to abnormal movements: None, normal Patient's awareness of abnormal movements (rate only patient's report): No Awareness, Dental Status Current problems with teeth and/or dentures?: No Does patient usually wear dentures?: No  CIWA:  CIWA-Ar Total: 0 COWS:     Musculoskeletal: Strength & Muscle Tone: within normal limits Gait & Station:  normal Patient leans: N/A  Psychiatric Specialty Exam: Physical Exam  Nursing note and vitals reviewed. Constitutional: He is oriented to person, place, and time. He appears well-developed and well-nourished.  Cardiovascular: Normal rate.   Respiratory: Effort normal.  Musculoskeletal: Normal range of motion.  Neurological: He is alert and oriented to person, place, and time.  Skin: Skin is warm.    Review of Systems  Constitutional: Negative.   HENT: Negative.   Eyes: Negative.   Respiratory: Negative.   Cardiovascular: Negative.   Gastrointestinal: Negative.   Genitourinary: Negative.   Musculoskeletal: Negative.   Skin: Negative.   Neurological: Negative.   Endo/Heme/Allergies: Negative.   Psychiatric/Behavioral: Negative for hallucinations and suicidal ideas.    Blood pressure 100/68, pulse 85, temperature 98.5 F (36.9 C), temperature source Oral, resp. rate 18, height 5\' 9"  (1.753 m), weight 78 kg (172 lb), SpO2 100 %.Body mass index is 25.4 kg/m.  General Appearance: Casual  Eye Contact:  Good  Speech:  Clear and Coherent and Normal Rate  Volume:  Normal  Mood:  Euthymic  Affect:  Congruent  Thought Process:  Coherent and Descriptions of Associations: Intact  Orientation:  Full (Time, Place, and Person)  Thought Content:  WDL  Suicidal Thoughts:  No  Homicidal Thoughts:  No  Memory:  Immediate;   Good Recent;   Good Remote;   Good  Judgement:  Good  Insight:  Good  Psychomotor Activity:  Normal  Concentration:  Concentration: Good and Attention Span: Good  Recall:  Good  Fund of Knowledge:  Good  Language:  Good  Akathisia:  No  Handed:  Right  AIMS (if indicated):     Assets:  Communication Skills Desire for Improvement Financial Resources/Insurance Housing Social Support Transportation  ADL's:  Intact  Cognition:  WNL  Sleep:  Number of Hours: 6.75     Have you used any form of tobacco in the last 30 days? (Cigarettes, Smokeless Tobacco,  Cigars, and/or Pipes): No  Has this patient used any form of tobacco in the last 30 days? (Cigarettes, Smokeless Tobacco, Cigars, and/or Pipes) Yes, No  Blood Alcohol level:  Lab Results  Component Value Date   ETH <10 57/32/2025    Metabolic Disorder Labs:  Lab Results  Component Value Date   HGBA1C 5.4 06/14/2017   MPG 108 06/14/2017   Lab Results  Component Value Date   PROLACTIN 26.0 (H) 06/14/2017   Lab Results  Component Value Date   CHOL 150 06/14/2017   TRIG 116 06/14/2017   HDL 61 06/14/2017   CHOLHDL 2.5 06/14/2017   VLDL 23 06/14/2017  [place] 66 06/14/2017   LDLCALC 55 03/26/2015    See Psychiatric Specialty Exam and Suicide Risk Assessment completed by Attending Physician prior to discharge.  Discharge destination:  Home  Is patient on multiple antipsychotic therapies at discharge:  No   Has Patient had three or more failed trials of antipsychotic monotherapy by history:  No  Recommended Plan for Multiple Antipsychotic Therapies: NA   Allergies as of 06/15/2017      Reactions   Ambien [zolpidem Tartrate] Other (See Comments)   Blackout, memory issues   Seroquel [quetiapine]    SEVERE NIGHTMARES, SLEEPWALK AND NIGHT DRIVE WITH NO RECOLLECTION UPON WAKENING      Medication List    STOP taking these medications   LORazepam 0.5 MG tablet Commonly known as:  ATIVAN   tiZANidine 4 MG tablet Commonly known as:  ZANAFLEX   traMADol 50 MG tablet Commonly known as:  ULTRAM     TAKE these medications     Indication  clopidogrel 75 MG tablet Commonly known as:  PLAVIX Take 1 tablet (75 mg total) by mouth daily.  Indication:  Per PCP   diltiazem 180 MG 24 hr capsule Commonly known as:  CARTIA XT Take 1 capsule (180 mg total) by mouth daily.  Indication:  High Blood Pressure Disorder   lamoTRIgine 100 MG tablet Commonly known as:  LAMICTAL Take 2.5 tablets (250 mg total) by mouth daily. For mood control What changed:  additional  instructions  Indication:  mood stability   nitroGLYCERIN 0.4 MG SL tablet Commonly known as:  NITROSTAT  Indication:  Chest pain   OLANZapine 10 MG tablet Commonly known as:  ZYPREXA Take 1 tablet (10 mg total) by mouth at bedtime. For mood control What changed:  additional instructions  Another medication with the same name was removed. Continue taking this medication, and follow the directions you see here.  Indication:  mood stability   rosuvastatin 20 MG tablet Commonly known as:  CRESTOR Take 20 mg by mouth daily.  Indication:  High Amount of Fats in the Blood   traZODone 50 MG tablet Commonly known as:  DESYREL Take 1 tablet (50 mg total) by mouth at bedtime as needed for sleep. What changed:  when to take this  reasons to take this  Indication:  [place] Follow up on 06/20/2017.   Specialty:  Behavioral Health Why:  at 9:00am for your initial assessment for the intensive outpatient program. Contact information: [place] [address] [facility] Follow up.   Specialty:  [facility] information: [place] [phone]          Follow-up recommendations:  Continue activity as tolerated. Continue diet as recommended by your PCP. Ensure to keep all appointments with outpatient providers.  Comments:  Patient is instructed prior to discharge to: Take all medications as prescribed by his/her mental healthcare provider. Report any adverse effects and or reactions from the medicines to his/her outpatient provider promptly. Patient has been instructed & cautioned: To not engage in alcohol and or illegal drug use while on prescription medicines. In the event of worsening symptoms, patient is instructed to call the crisis  hotline, 911 and or go to the nearest ED for appropriate evaluation and treatment of symptoms. To follow-up with his/her  primary care provider for your other medical issues, concerns and or health care needs.    Signed: [place], FNP 06/15/2017, 10:04 AM   Patient seen, Suicide Assessment Completed.  Disposition Plan Reviewed

## 2017-06-19 ENCOUNTER — Ambulatory Visit (INDEPENDENT_AMBULATORY_CARE_PROVIDER_SITE_OTHER): Payer: Medicare Other | Admitting: Internal Medicine

## 2017-06-19 ENCOUNTER — Encounter: Payer: Self-pay | Admitting: Internal Medicine

## 2017-06-19 ENCOUNTER — Other Ambulatory Visit (INDEPENDENT_AMBULATORY_CARE_PROVIDER_SITE_OTHER): Payer: Medicare Other

## 2017-06-19 VITALS — BP 126/84 | HR 76 | Temp 97.7°F | Ht 69.0 in | Wt 167.0 lb

## 2017-06-19 DIAGNOSIS — E229 Hyperfunction of pituitary gland, unspecified: Secondary | ICD-10-CM | POA: Diagnosis not present

## 2017-06-19 DIAGNOSIS — R739 Hyperglycemia, unspecified: Secondary | ICD-10-CM

## 2017-06-19 DIAGNOSIS — S6291XA Unspecified fracture of right wrist and hand, initial encounter for closed fracture: Secondary | ICD-10-CM | POA: Insufficient documentation

## 2017-06-19 DIAGNOSIS — D649 Anemia, unspecified: Secondary | ICD-10-CM | POA: Diagnosis not present

## 2017-06-19 DIAGNOSIS — Z0000 Encounter for general adult medical examination without abnormal findings: Secondary | ICD-10-CM

## 2017-06-19 DIAGNOSIS — S6291XD Unspecified fracture of right wrist and hand, subsequent encounter for fracture with routine healing: Secondary | ICD-10-CM | POA: Diagnosis not present

## 2017-06-19 DIAGNOSIS — R7989 Other specified abnormal findings of blood chemistry: Secondary | ICD-10-CM

## 2017-06-19 LAB — CBC WITH DIFFERENTIAL/PLATELET
BASOS PCT: 0.7 % (ref 0.0–3.0)
Basophils Absolute: 0 10*3/uL (ref 0.0–0.1)
EOS ABS: 0 10*3/uL (ref 0.0–0.7)
EOS PCT: 0.8 % (ref 0.0–5.0)
HCT: 42.6 % (ref 39.0–52.0)
Hemoglobin: 14.1 g/dL (ref 13.0–17.0)
LYMPHS ABS: 1 10*3/uL (ref 0.7–4.0)
Lymphocytes Relative: 17.6 % (ref 12.0–46.0)
MCHC: 33.2 g/dL (ref 30.0–36.0)
MCV: 97 fl (ref 78.0–100.0)
MONO ABS: 0.6 10*3/uL (ref 0.1–1.0)
Monocytes Relative: 10.4 % (ref 3.0–12.0)
NEUTROS PCT: 70.5 % (ref 43.0–77.0)
Neutro Abs: 4.2 10*3/uL (ref 1.4–7.7)
Platelets: 335 10*3/uL (ref 150.0–400.0)
RBC: 4.39 Mil/uL (ref 4.22–5.81)
RDW: 13.7 % (ref 11.5–15.5)
WBC: 5.9 10*3/uL (ref 4.0–10.5)

## 2017-06-19 LAB — PSA: PSA: 2.96 ng/mL (ref 0.10–4.00)

## 2017-06-19 LAB — IBC PANEL
IRON: 113 ug/dL (ref 42–165)
Saturation Ratios: 30.6 % (ref 20.0–50.0)
TRANSFERRIN: 264 mg/dL (ref 212.0–360.0)

## 2017-06-19 LAB — HEMOGLOBIN A1C: HEMOGLOBIN A1C: 4.5 % — AB (ref 4.6–6.5)

## 2017-06-19 LAB — FERRITIN: FERRITIN: 92.7 ng/mL (ref 22.0–322.0)

## 2017-06-19 NOTE — Assessment & Plan Note (Signed)

## 2017-06-19 NOTE — Assessment & Plan Note (Signed)
Also for a1c 

## 2017-06-19 NOTE — Assessment & Plan Note (Signed)
Will need ortho f/u - for referral

## 2017-06-19 NOTE — Progress Notes (Signed)
Subjective:    Patient ID: Justin [name], male    DOB: 01-06-1946, 71 y.o.   MRN: [id number]  HPI  Here for wellness and f/u;  Overall doing ok;  Pt denies Chest pain, worsening SOB, DOE, wheezing, orthopnea, PND, worsening LE edema, palpitations, dizziness or syncope.  Pt denies neurological change such as new headache, facial or extremity weakness.  Pt denies polydipsia, polyuria, or low sugar symptoms. Pt states overall good compliance with treatment and medications, good tolerability, and has been trying to follow appropriate diet.  Pt denies worsening depressive symptoms, suicidal ideation or panic. No fever, night sweats, wt loss, loss of appetite, or other constitutional symptoms.  Pt states good ability with ADL's, has low fall risk, home safety reviewed and adequate, no other significant changes in hearing or vision, and only occasionally active with exercise. S/p recent hand fx x 2 hairline per pt now casted after recent fall last 12/17/2022.  Wife died about 18 mo ago, still not over this, and dog had to be put down last wk, so some dysphoric today.  Wt was 206 at time of wt death, believes not eating well wiith red meat. Worked 30 yrs at [employer] but did not smoke.  No hx of anemia or overt bleeding.  Did have an elevated prolactin level recently.   Past Medical History:  Diagnosis Date  . Anxiety   . Arthritis   . Asthma   . Bipolar disorder ([place])   . CAD S/P percutaneous coronary stenting   . CHF (congestive heart failure) ([place])   . Chicken pox   . Colon polyps   . Depression   . 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 Grief   . Hx of adenomatous colonic polyps 06/22/2016  . Hyperlipidemia   . Hypertension   . Migraines   . NSTEMI (non-ST elevated myocardial infarction) ([place])   . PTSD (post-traumatic stress disorder)    Past Surgical History:  Procedure Laterality Date  . CERVICAL DISCECTOMY    . COLONOSCOPY    . ELBOW SURGERY Left   . ELBOW SURGERY Right   . INGUINAL HERNIA  REPAIR Bilateral    1996, 1997  . KNEE ARTHROSCOPY Right   . KNEE CARTILAGE SURGERY Left   . NASAL SEPTUM SURGERY    . STENT PLACEMENT VASCULAR ([place] HX)  09/02/2010  . TONSILLECTOMY      reports that he has never smoked. He has never used smokeless tobacco. He reports that he does not drink alcohol or use drugs. family history includes Alzheimer's disease in his mother; Arthritis in his father and mother; Diabetes in his sister and sister; Heart attack in his father and mother; Heart disease in his father and mother; Hyperlipidemia in his father; Hypertension in his father and mother; Lung cancer in his maternal aunt, paternal aunt, and paternal grandmother; Multiple sclerosis in his sister; Stroke in his father. Allergies  Allergen Reactions  . Ambien [Zolpidem Tartrate] Other (See Comments)    Blackout, memory issues  . Seroquel [Quetiapine]     SEVERE NIGHTMARES, SLEEPWALK AND NIGHT DRIVE WITH NO RECOLLECTION UPON WAKENING   Current Outpatient Prescriptions on File Prior to Visit  Medication Sig Dispense Refill  . clopidogrel (PLAVIX) 75 MG tablet Take 1 tablet (75 mg total) by mouth daily. 30 tablet 0  . diltiazem (CARTIA XT) 180 MG 24 hr capsule Take 1 capsule (180 mg total) by mouth daily. 30 capsule 0  . lamoTRIgine (LAMICTAL) 100 MG tablet Take 2.5 tablets (  250 mg total) by mouth daily. For mood control 75 tablet 0  . nitroGLYCERIN (NITROSTAT) 0.4 MG SL tablet     . OLANZapine (ZYPREXA) 10 MG tablet Take 1 tablet (10 mg total) by mouth at bedtime. For mood control 30 tablet 0  . rosuvastatin (CRESTOR) 20 MG tablet Take 20 mg by mouth daily.    . traZODone (DESYREL) 50 MG tablet Take 1 tablet (50 mg total) by mouth at bedtime as needed for sleep. 30 tablet 0   No current facility-administered medications on file prior to visit.    Review of Systems Constitutional: Negative for other unusual diaphoresis, sweats, appetite or weight changes HENT: Negative for other worsening  hearing loss, ear pain, facial swelling, mouth sores or neck stiffness.   Eyes: Negative for other worsening pain, redness or other visual disturbance.  Respiratory: Negative for other stridor or swelling Cardiovascular: Negative for other palpitations or other chest pain  Gastrointestinal: Negative for worsening diarrhea or loose stools, blood in stool, distention or other pain Genitourinary: Negative for hematuria, flank pain or other change in urine volume.  Musculoskeletal: Negative for myalgias or other joint swelling.  Skin: Negative for other color change, or other wound or worsening drainage.  Neurological: Negative for other syncope or numbness. Hematological: Negative for other adenopathy or swelling Psychiatric/Behavioral: Negative for hallucinations, other worsening agitation, SI, self-injury, or new decreased concentration All other system neg per pt    Objective:   Physical Exam BP 126/84   Pulse 76   Temp 97.7 F (36.5 C) (Oral)   Ht 5\' 9"  (1.753 m)   Wt 167 lb (75.8 kg)   SpO2 99%   BMI 24.66 kg/m  VS noted,  Constitutional: Pt is oriented to person, place, and time. Appears well-developed and well-nourished, in no significant distress and comfortable Head: Normocephalic and atraumatic  Eyes: Conjunctivae and EOM are normal. Pupils are equal, round, and reactive to light Right Ear: External ear normal without discharge Left Ear: External ear normal without discharge Nose: Nose without discharge or deformity Mouth/Throat: Oropharynx is without other ulcerations and moist  Neck: Normal range of motion. Neck supple. No JVD present. No tracheal deviation present or significant neck LA or mass Cardiovascular: Normal rate, regular rhythm, normal heart sounds and intact distal pulses.   Pulmonary/Chest: WOB normal and breath sounds without rales or wheezing  Abdominal: Soft. Bowel sounds are normal. NT. No HSM  Musculoskeletal: Normal range of motion. Exhibits no  edema Lymphadenopathy: Has no other cervical adenopathy.  Neurological: Pt is alert and oriented to person, place, and time. Pt has normal reflexes. No cranial nerve deficit. Motor grossly intact, Gait intact Skin: Skin is warm and dry. No rash noted or new ulcerations; distal RUE casted Psychiatric:  Has dysphoric mood and affect. Behavior is normal without agitation No other exam findings  Lab Results  Component Value Date   WBC 8.3 06/09/2017   HGB 11.6 (L) 06/09/2017   HCT 34.8 (L) 06/09/2017   PLT 231 06/09/2017   GLUCOSE 139 (H) 06/09/2017   CHOL 150 06/14/2017   TRIG 116 06/14/2017   HDL 61 06/14/2017   LDLCALC 66 06/14/2017   ALT 35 06/09/2017   AST 63 (H) 06/09/2017   NA 133 (L) 06/09/2017   K 3.7 06/09/2017   CL 101 06/09/2017   CREATININE 0.88 06/09/2017   BUN 15 06/09/2017   CO2 22 06/09/2017   TSH 1.189 06/14/2017   PSA 1.36 06/09/2016   HGBA1C 5.4 06/14/2017  Prolactin 4.0 - 15.2 ng/mL 26.0          Assessment & Plan:

## 2017-06-19 NOTE — Assessment & Plan Note (Signed)
[facility] for referral endocrinology

## 2017-06-19 NOTE — Assessment & Plan Note (Signed)
Also for iron level 

## 2017-06-19 NOTE — Patient Instructions (Addendum)
Please continue all other medications as before, and refills have been done if requested.  Please have the pharmacy call with any other refills you may need.  Please continue your efforts at being more active, low cholesterol diet, and weight control.  You are otherwise up to date with prevention measures today.  Please keep your appointments with your specialists as you may have planned  You will be contacted regarding the referral for: [facility], and Endocrinology (for the elevated prloactin)  Please go to the LAB in the Basement (turn left off the elevator) for the tests to be done today  You will be contacted by phone if any changes need to be made immediately.  Otherwise, you will receive a letter about your results with an explanation, but please check with MyChart first.  Please remember to sign up for MyChart if you have not done so, as this will be important to you in the future with finding out test results, communicating by private email, and scheduling acute appointments online when needed.  Please return in 1 year for your yearly visit, or sooner if needed, with Lab testing done 3-5 days before

## 2017-06-26 ENCOUNTER — Ambulatory Visit (HOSPITAL_COMMUNITY): Payer: Self-pay | Admitting: Psychiatry

## 2017-07-11 DIAGNOSIS — M25531 Pain in right wrist: Secondary | ICD-10-CM | POA: Insufficient documentation

## 2017-07-17 ENCOUNTER — Encounter (HOSPITAL_COMMUNITY): Payer: Self-pay | Admitting: Licensed Clinical Social Worker

## 2017-07-17 ENCOUNTER — Ambulatory Visit (HOSPITAL_COMMUNITY): Payer: Medicare Other | Admitting: Licensed Clinical Social Worker

## 2017-07-17 DIAGNOSIS — F440 Dissociative amnesia: Secondary | ICD-10-CM

## 2017-07-17 DIAGNOSIS — F3162 Bipolar disorder, current episode mixed, moderate: Secondary | ICD-10-CM | POA: Diagnosis not present

## 2017-07-17 DIAGNOSIS — F4321 Adjustment disorder with depressed mood: Secondary | ICD-10-CM

## 2017-07-17 DIAGNOSIS — F4310 Post-traumatic stress disorder, unspecified: Secondary | ICD-10-CM | POA: Diagnosis not present

## 2017-07-17 NOTE — Progress Notes (Signed)
   THERAPIST PROGRESS NOTE  Session Time: 8:10am-9:10am  Participation Level: Active  Behavioral Response: Well GroomedAlertEuthymic  Type of Therapy: Individual Therapy  Treatment Goals addressed: improve psychiatric symptoms, Controlled Behavior, Moderated Mood, Deliberative Speech (improved social functioning), Improve Unhelpful Thought Patterns, Emotional Regulation Skills (Moderate moods, anger management, stress management), Feel and express a full Range of Emotions, Learn about Diagnosis, Healthy Coping Skills, Recall the Traumatic event without being overwhelmed    Interventions: CBT, Motivational Interviewing, grounding techniques and mindfulness techniques, and psychoeducation  Summary: Justin [name] is a 71 y.o. male who presents with Bipolar 1 Disorder, Mixed, Moderate and Dissociative Amnesia, Grief, and PTSD  Suicidal/Homicidal: No - without intent/plan  Therapist Response:  Kiante met with clinician for an individual session. Azari discussed his psychiatric symptoms, his current life events and his homework. Saivion entered session with a brace on his right hand and reported that he fell while doing housework and work on the carport. He identified pain and possibility of surgery to repair joint damage. [name] reported he had been in [facility] in October following this injury due to problems with depression, accidental overdose and hallucinations from Lorazepam, and severe grief. Clinician processed expectations of the grief process and provided psychoeducation about stages of grief. [name] identified ongoing anger about his wife's death and his loneliness. [name] also noted that his brain cannot process continual loss, which was recent tested with the death of his dog. [name] identified the ongoing need for therapy. He also noted that since his fall and hospitalization, his son has been more attentive to him and has been caring for him much differently. [name] discussed some hesitation due to  worry that after the holidays, son will back off again. However, he reports he will continue to accept invitations and attempt to make plans to spend time with the family at least 1-2 times per week.    Plan: Return again in 1 -2 weeks.  Diagnosis:     Axis I: Bipolar 1 Disorder, Mixed, Moderate and Dissociative Amnesia, Grief, and PTSD   R , LCSW 07/17/2017  

## 2017-07-24 ENCOUNTER — Encounter (HOSPITAL_COMMUNITY): Payer: Self-pay | Admitting: Licensed Clinical Social Worker

## 2017-07-24 ENCOUNTER — Ambulatory Visit (HOSPITAL_COMMUNITY): Payer: Medicare Other | Admitting: Licensed Clinical Social Worker

## 2017-07-24 DIAGNOSIS — F440 Dissociative amnesia: Secondary | ICD-10-CM

## 2017-07-24 DIAGNOSIS — F4310 Post-traumatic stress disorder, unspecified: Secondary | ICD-10-CM | POA: Diagnosis not present

## 2017-07-24 DIAGNOSIS — F4321 Adjustment disorder with depressed mood: Secondary | ICD-10-CM

## 2017-07-24 DIAGNOSIS — F3162 Bipolar disorder, current episode mixed, moderate: Secondary | ICD-10-CM | POA: Diagnosis not present

## 2017-07-24 NOTE — Progress Notes (Signed)
   THERAPIST PROGRESS NOTE  Session Time: 1:30pm-2:30pm  Participation Level: Active  Behavioral Response: Well GroomedAlertEuthymic  Type of Therapy: Individual Therapy  Treatment Goals addressed: improve psychiatric symptoms, Controlled Behavior, Moderated Mood, Deliberative Speech (improved social functioning), Improve Unhelpful Thought Patterns, Emotional Regulation Skills (Moderate moods, anger management, stress management), Feel and express a full Range of Emotions, Learn about Diagnosis, Healthy Coping Skills, Recall the Traumatic event without being overwhelmed    Interventions: CBT, Motivational Interviewing, grounding techniques and mindfulness techniques, and psychoeducation  Summary: Justin [name] is a 71 y.o. male who presents with Bipolar 1 Disorder, Mixed, Moderate and Dissociative Amnesia, Grief, and PTSD  Suicidal/Homicidal: No - without intent/plan  Therapist Response:  Safi met with clinician for an individual session. Dontae discussed his psychiatric symptoms, his current life events and his homework. Jaxxen reports that he continues to miss his dog daily and feels very lonely. However, he reports he is not yet ready to get another dog. Clinician utilized MI OARS to reflect his thoughts and feelings, as well as remind him of how much love he gave and received from his dog, and how he did everything that could have been done to give her a pain-free life and death.  Shigeru processed interactions with son and sister. He noted improvement with his son and increased interactions. However, he noted stress when his sister attempts to play the guilt trip with him. Clinician challenged Dametri's automatic thoughts about "shoulds" and mind-reading. Clinician also noted the progress that has been made in his wellness.   Plan: Return again in 1 -2 weeks.  Diagnosis:     Axis I: Bipolar 1 Disorder, Mixed, Moderate and Dissociative Amnesia, Grief, and PTSD  [name], LCSW 07/24/2017

## 2017-07-31 ENCOUNTER — Ambulatory Visit (HOSPITAL_COMMUNITY): Payer: Self-pay | Admitting: Licensed Clinical Social Worker

## 2017-08-02 ENCOUNTER — Ambulatory Visit: Payer: Self-pay | Admitting: Endocrinology

## 2017-08-07 ENCOUNTER — Encounter (HOSPITAL_COMMUNITY): Payer: Self-pay | Admitting: Psychiatry

## 2017-08-07 ENCOUNTER — Ambulatory Visit (HOSPITAL_COMMUNITY): Payer: Medicare Other | Admitting: Psychiatry

## 2017-08-07 VITALS — BP 136/78 | HR 67 | Ht 69.0 in | Wt 174.4 lb

## 2017-08-07 DIAGNOSIS — Z79899 Other long term (current) drug therapy: Secondary | ICD-10-CM

## 2017-08-07 DIAGNOSIS — F4310 Post-traumatic stress disorder, unspecified: Secondary | ICD-10-CM

## 2017-08-07 DIAGNOSIS — Z915 Personal history of self-harm: Secondary | ICD-10-CM | POA: Diagnosis not present

## 2017-08-07 DIAGNOSIS — F3162 Bipolar disorder, current episode mixed, moderate: Secondary | ICD-10-CM

## 2017-08-07 DIAGNOSIS — F411 Generalized anxiety disorder: Secondary | ICD-10-CM | POA: Diagnosis not present

## 2017-08-07 DIAGNOSIS — Z818 Family history of other mental and behavioral disorders: Secondary | ICD-10-CM | POA: Diagnosis not present

## 2017-08-07 MED ORDER — ALPRAZOLAM 0.5 MG PO TABS: 0.5000 mg | ORAL_TABLET | Freq: Every day | ORAL | 1 refills | Status: DC | PRN

## 2017-08-07 MED ORDER — LAMOTRIGINE 150 MG PO TABS: 300.0000 mg | ORAL_TABLET | Freq: Every day | ORAL | 1 refills | Status: DC

## 2017-08-07 MED ORDER — OLANZAPINE 10 MG PO TABS: 10.0000 mg | ORAL_TABLET | Freq: Every day | ORAL | 1 refills | Status: DC

## 2017-08-07 MED ORDER — TRAZODONE HCL 50 MG PO TABS: 50.0000 mg | ORAL_TABLET | Freq: Every evening | ORAL | 0 refills | Status: DC | PRN

## 2017-08-07 NOTE — Progress Notes (Signed)
[facility] MD/PA/NP OP Progress Note  08/07/2017 12:33 PM Justin [name]  MRN:  [id number]  Chief Complaint: I was admitted in the hospital in October.  I think I have a reaction with the medication.  I fell and having hallucinations.  HPI: Justin [name] came for his follow-up appointment.  He was last seen in October.  He was admitted to behavioral [facility] in October due to having hallucination, increased depression and having suicidal thoughts.  Patient believes he has a reaction to Ativan with pain medication.  He also fell and broke his right arm.  He was admitted for a few days at behavioral [facility] and there were no new medication added.  He is no longer taking Ativan.  He used to take Xanax but in October he said that Xanax is not working and we tried him on Ativan.  He believes he took Ativan more than usual to calm his nerves but also taking pain medication and then he does not know what happened and he fell and he called 911 and taken to the hospital.  At that time he was having hallucination and paranoia but that subsided.  Patient told he was sad and depressed because he had to put his dog.  He is feeling much better.  He still have cast on his right arm.  He is hoping to see orthopedic very soon.  His energy level is fair.  He still feels anxious nervous about his living situation.  He lives by himself.  His Thanksgiving was boring because he did not have invitation from his son but his son did came to drop food.  Patient denies any agitation, anger, mania, psychosis or any hallucination.  He admitted having nightmares and flashback.  He also admitted not taking the Zyprexa every night.  He also forgot to take Lamictal 250 mg and only taking 200.  He has no rash or itching.  Like to go back on low-dose Xanax which helped her in the past.  He has no tremors or shakes.  His appetite is okay.  He denies any feeling of hopelessness or worthlessness.  He denies any suicidal thoughts.  Patient denies  drinking alcohol or using any illegal substances.  He started seeing Janett Billow for counseling.  Visit Diagnosis:    ICD-10-CM   1. Bipolar 1 disorder, mixed, moderate (HCC) F31.62 lamoTRIgine (LAMICTAL) 150 MG tablet    OLANZapine (ZYPREXA) 10 MG tablet  2. PTSD (post-traumatic stress disorder) F43.10 lamoTRIgine (LAMICTAL) 150 MG tablet    traZODone (DESYREL) 50 MG tablet  3. Generalized anxiety disorder F41.1 ALPRAZolam (XANAX) 0.5 MG tablet    Past Psychiatric History: Reviewed. Patient has bipolar disorder with multiple hospitalizations.  Most of psychiatric hospitalization was due to mania, depression, suicidal attempt. He has taken overdose on his psychiatric medication including Ambien, Vistaril and alcohol. His last psychiatric hospitalization was in October 2018 at [facility]. In the past he has taken Zyprexa, Cymbalta, Seroquel, Vistaril, Ambien, Neurontin, Wellbutrin, Xanax and Trileptal he had he was briefly given Ativan but he believe having a reaction with Ativan and pain medication together. He did remember lithium cause side effects. He was seeing Dr. Letta Moynahan and Parthenia Ames. Patient has history of mania, psychosis, hallucination and severe. He finished intensive outpatient program depression.   Past Medical History:  Past Medical History:  Diagnosis Date  . Anxiety   . Arthritis   . Asthma   . Bipolar disorder ([place])   . CAD S/P percutaneous  coronary stenting   . CHF (congestive heart failure) ([place])   . Chicken pox   . Colon polyps   . Depression   . 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 Grief   . Hx of adenomatous colonic polyps 06/22/2016  . Hyperlipidemia   . Hypertension   . Migraines   . NSTEMI (non-ST elevated myocardial infarction) ([place])   . PTSD (post-traumatic stress disorder)     Past Surgical History:  Procedure Laterality Date  . CERVICAL DISCECTOMY    . COLONOSCOPY    . ELBOW SURGERY Left   . ELBOW SURGERY Right   . INGUINAL  HERNIA REPAIR Bilateral    1996, 1997  . KNEE ARTHROSCOPY Right   . KNEE CARTILAGE SURGERY Left   . NASAL SEPTUM SURGERY    . STENT PLACEMENT VASCULAR ([place] HX)  09/02/2010  . TONSILLECTOMY      Family Psychiatric History: Reviewed.  Family History:  Family History  Problem Relation Age of Onset  . Arthritis Mother   . Heart disease Mother   . Hypertension Mother   . Alzheimer's disease Mother   . Heart attack Mother   . Arthritis Father   . Hyperlipidemia Father   . Heart disease Father   . Stroke Father   . Hypertension Father   . Heart attack Father   . Multiple sclerosis Sister   . Diabetes Sister   . Lung cancer Paternal Grandmother   . Diabetes Sister   . Lung cancer Maternal Aunt   . Lung cancer Paternal Aunt     Social History:  Social History   Socioeconomic History  . Marital status: Widowed    Spouse name: Not on file  . Number of children: 2  . Years of education: 48  . Highest education level: Not on file  Social Needs  . Financial resource strain: Not on file  . Food insecurity - worry: Not on file  . Food insecurity - inability: Not on file  . Transportation needs - medical: Not on file  . Transportation needs - non-medical: Not on file  Occupational History  . Occupation: Retired  Tobacco Use  . Smoking status: Never Smoker  . Smokeless tobacco: Never Used  Substance and Sexual Activity  . Alcohol use: No    Alcohol/week: 0.0 oz  . Drug use: No  . Sexual activity: Not Currently  Other Topics Concern  . Not on file  Social History Narrative   Retired, widowed in 2017    1 son one daughter   2 caffeinated beverages daily no alcohol or tobacco   Fun: Work out in the yard.   Denies religious beliefs effecting healthcare.     Allergies:  Allergies  Allergen Reactions  . Ambien [Zolpidem Tartrate] Other (See Comments)    Blackout, memory issues  . Seroquel [Quetiapine]     SEVERE NIGHTMARES, SLEEPWALK AND NIGHT DRIVE WITH NO  RECOLLECTION UPON WAKENING    Metabolic Disorder Labs: Lab Results  Component Value Date   HGBA1C 4.5 (L) 06/19/2017   MPG 108 06/14/2017   Lab Results  Component Value Date   PROLACTIN 26.0 (H) 06/14/2017   Lab Results  Component Value Date   CHOL 150 06/14/2017   TRIG 116 06/14/2017   HDL 61 06/14/2017   CHOLHDL 2.5 06/14/2017   VLDL 23 06/14/2017   LDLCALC 66 06/14/2017   LDLCALC 55 03/26/2015   Lab Results  Component Value Date   TSH 1.189 06/14/2017   TSH 0.59 03/26/2015  Therapeutic Level Labs: No results found for: LITHIUM No results found for: VALPROATE No components found for:  CBMZ  Current Medications: Current Outpatient Medications  Medication Sig Dispense Refill  . clopidogrel (PLAVIX) 75 MG tablet Take 1 tablet (75 mg total) by mouth daily. 30 tablet 0  . diltiazem (CARTIA XT) 180 MG 24 hr capsule Take 1 capsule (180 mg total) by mouth daily. 30 capsule 0  . lamoTRIgine (LAMICTAL) 100 MG tablet Take 2.5 tablets (250 mg total) by mouth daily. For mood control 75 tablet 0  . nitroGLYCERIN (NITROSTAT) 0.4 MG SL tablet     . OLANZapine (ZYPREXA) 10 MG tablet Take 1 tablet (10 mg total) by mouth at bedtime. For mood control 30 tablet 0  . rosuvastatin (CRESTOR) 20 MG tablet Take 20 mg by mouth daily.    . traZODone (DESYREL) 50 MG tablet Take 1 tablet (50 mg total) by mouth at bedtime as needed for sleep. 30 tablet 0   No current facility-administered medications for this visit.      Musculoskeletal: Strength & Muscle Tone: within normal limits Gait & Station: normal Patient leans: N/A  Psychiatric Specialty Exam: Review of Systems  Constitutional: Negative.   HENT: Negative.   Genitourinary: Negative.   Musculoskeletal: Positive for joint pain.       Patient has a cast on his right arm.  Skin: Negative.  Negative for itching and rash.  Neurological: Negative.   Psychiatric/Behavioral: The patient is nervous/anxious and has insomnia.      Blood pressure 136/78, pulse 67, height 5\' 9"  (1.753 m), weight 174 lb 6.4 oz (79.1 kg).There is no height or weight on file to calculate BMI.  General Appearance: Casual  Eye Contact:  Good  Speech:  Clear and Coherent  Volume:  Normal  Mood:  Anxious  Affect:  Appropriate  Thought Process:  Goal Directed  Orientation:  Full (Time, Place, and Person)  Thought Content: Rumination   Suicidal Thoughts:  No  Homicidal Thoughts:  No  Memory:  Immediate;   Fair Recent;   Fair Remote;   Fair  Judgement:  Fair  Insight:  Good  Psychomotor Activity:  Normal  Concentration:  Concentration: Fair and Attention Span: Fair  Recall:  Good  Fund of Knowledge: Good  Language: Good  Akathisia:  No  Handed:  Right  AIMS (if indicated): not done  Assets:  Communication Skills Desire for Improvement Housing  ADL's:  Intact  Cognition: WNL  Sleep:  Fair   Screenings: AIMS     Admission (Discharged) from 06/12/2017 in [facility]  AIMS Total Score  0    AUDIT     Admission (Discharged) from 06/12/2017 in [facility]  Alcohol Use Disorder Identification Test Final Score (AUDIT)  0    Mini-Mental     Office Visit from 03/26/2015 in [facility]  Total Score (max 30 points )  30    PHQ2-9     Office Visit from 06/09/2016 in [facility] from 01/13/2016 in [facility] Office Visit from 03/26/2015 in [facility]  PHQ-2 Total Score  2  6  0  PHQ-9 Total Score  8  26  No data       Assessment and Plan: Bipolar disorder type I.  Posttraumatic stress disorder.  Anxiety disorder NOS.  I review records from the emergency room and behavioral [facility].  His UDS is positive for benzodiazepine.  His hemoglobin A1c is 4.5.  I will restart Xanax 0.5 mg to take as needed to help his anxiety and nervousness.  I encouraged to  take Lamictal 300 mg to help his mood lability and depression.  So far is tolerating his medication and he has no rash or side effects.  I also encouraged to take Zyprexa every night but did not take trazodone unless he has insomnia.  Discussed medication side effects and benefits.  Encouraged to keep appointment with Janett Billow for CBT.  Discussed benzodiazepine dependence tolerance and withdrawal.  Recommended to call us back if is any question or any concern.  Follow-up in 6 weeks.  Time spent 25 minutes.  More than 50% of the time spent in psychoeducation, counseling, coordination, compliance, prognosis and psychosocial stressors.   Kathlee Nations, MD 08/07/2017, 12:33 PM

## 2017-08-10 ENCOUNTER — Ambulatory Visit (INDEPENDENT_AMBULATORY_CARE_PROVIDER_SITE_OTHER): Payer: Medicare Other | Admitting: Licensed Clinical Social Worker

## 2017-08-10 ENCOUNTER — Encounter (HOSPITAL_COMMUNITY): Payer: Self-pay | Admitting: Licensed Clinical Social Worker

## 2017-08-10 DIAGNOSIS — F3130 Bipolar disorder, current episode depressed, mild or moderate severity, unspecified: Secondary | ICD-10-CM | POA: Diagnosis not present

## 2017-08-10 DIAGNOSIS — F4321 Adjustment disorder with depressed mood: Secondary | ICD-10-CM | POA: Diagnosis not present

## 2017-08-10 NOTE — Progress Notes (Signed)
   THERAPIST PROGRESS NOTE  Session Time: 4:30pm-5:25pm  Participation Level: Active  Behavioral Response: Well GroomedAlertDepressed  Type of Therapy: Individual Therapy  Treatment Goals addressed: improve psychiatric symptoms, Controlled Behavior, Moderated Mood, Deliberative Speech (improved social functioning), Improve Unhelpful Thought Patterns, Emotional Regulation Skills (Moderate moods, anger management, stress management), Feel and express a full Range of Emotions, Learn about Diagnosis, Healthy Coping Skills, Recall the Traumatic event without being overwhelmed    Interventions: CBT, Motivational Interviewing, grounding techniques and mindfulness techniques, and psychoeducation  Summary: Castin Donaghue is a 71 y.o. male who presents with Bipolar 1 Disorder, Mixed, Moderate and Dissociative Amnesia, Grief, and PTSD  Suicidal/Homicidal: No - without intent/plan  Therapist Response:  Rennie met with clinician for an individual session. Jaccob discussed his psychiatric symptoms, his current life events and his homework. Joseguadalupe reports ongoing difficulty getting along with his sister. Devarious processed past hx of being blamed for his father's death and his current thoughts and feelings about this. Jubal agreed that he had nothing to do with it and that it was a bad coincidence. Bohdan reports he also has struggled with maintaining a relationship with his son, due to him not wanting to push himself on the son, but also due to the son being busy with his family. Clinician processed Jarmel's tendency to walk on eggshells with his family members and explored options for being more assertive. Clinician also discussed recent reports of increased loneliness. Clinician discussed getting another dog at some point. Argil was more receptive to that option, but reported that he would still need to spend a little more time in his grief.   Plan: Return again in 1 -2 weeks.  Diagnosis:     Axis  I: Bipolar 1 Disorder, Mixed, Moderate and Dissociative Amnesia, Grief, and PTSD  [name], LCSW 08/10/2017

## 2017-08-25 ENCOUNTER — Telehealth: Payer: Self-pay | Admitting: Internal Medicine

## 2017-08-25 DIAGNOSIS — I2510 Atherosclerotic heart disease of native coronary artery without angina pectoris: Secondary | ICD-10-CM

## 2017-08-25 DIAGNOSIS — I2583 Coronary atherosclerosis due to lipid rich plaque: Principal | ICD-10-CM

## 2017-08-25 NOTE — Telephone Encounter (Signed)
Copied from [place] [id number]. Topic: Quick Communication - See Telephone Encounter >> Aug 25, 2017  3:05 PM Percell Belt A wrote: CRM for notification. See Telephone encounter for: pt called in and said that he has a cardio dr in [place] but he would like a referral to go to [facility]?  Pt doesn't have a certain dr that he wanted to see.   08/25/17.

## 2017-08-25 NOTE — Addendum Note (Signed)
Addended by: Biagio Borg on: 08/25/2017 04:47 PM   Modules accepted: Orders

## 2017-08-25 NOTE — Telephone Encounter (Signed)
Referral Done per emr

## 2017-09-05 ENCOUNTER — Ambulatory Visit (HOSPITAL_COMMUNITY): Payer: Medicare Other | Admitting: Licensed Clinical Social Worker

## 2017-09-05 ENCOUNTER — Encounter (HOSPITAL_COMMUNITY): Payer: Self-pay | Admitting: Licensed Clinical Social Worker

## 2017-09-05 ENCOUNTER — Other Ambulatory Visit (HOSPITAL_COMMUNITY): Payer: Self-pay | Admitting: Psychiatry

## 2017-09-05 ENCOUNTER — Telehealth (HOSPITAL_COMMUNITY): Payer: Self-pay

## 2017-09-05 DIAGNOSIS — F319 Bipolar disorder, unspecified: Secondary | ICD-10-CM

## 2017-09-05 DIAGNOSIS — F4310 Post-traumatic stress disorder, unspecified: Secondary | ICD-10-CM

## 2017-09-05 DIAGNOSIS — F3160 Bipolar disorder, current episode mixed, unspecified: Secondary | ICD-10-CM

## 2017-09-05 DIAGNOSIS — F440 Dissociative amnesia: Secondary | ICD-10-CM | POA: Diagnosis not present

## 2017-09-05 DIAGNOSIS — F411 Generalized anxiety disorder: Secondary | ICD-10-CM

## 2017-09-05 DIAGNOSIS — F3162 Bipolar disorder, current episode mixed, moderate: Secondary | ICD-10-CM | POA: Diagnosis not present

## 2017-09-05 DIAGNOSIS — F4321 Adjustment disorder with depressed mood: Secondary | ICD-10-CM | POA: Diagnosis not present

## 2017-09-05 MED ORDER — ALPRAZOLAM 0.5 MG PO TABS: 0.5000 mg | ORAL_TABLET | Freq: Two times a day (BID) | ORAL | 0 refills | Status: DC | PRN

## 2017-09-05 NOTE — Telephone Encounter (Signed)
Patient was here to see Evelena Peat and asked for an increase in his Xanax 0.5 mg, Dr. Adele Schilder agreed to increase it to twice a day. I called in the new prescription and called patient to let him know

## 2017-09-05 NOTE — Progress Notes (Signed)
   THERAPIST PROGRESS NOTE  Session Time: 1:30pm-2:30pm  Participation Level: Active  Behavioral Response: Well GroomedAlertEuthymic   Type of Therapy: Individual Therapy  Treatment Goals addressed: improve psychiatric symptoms, Controlled Behavior, Moderated Mood, Deliberative Speech (improved social functioning), Improve Unhelpful Thought Patterns, Emotional Regulation Skills (Moderate moods, anger management, stress management), Feel and express a full Range of Emotions, Learn about Diagnosis, Healthy Coping Skills, Recall the Traumatic event without being overwhelmed    Interventions: CBT, Motivational Interviewing, grounding techniques and mindfulness techniques, and psychoeducation  Summary: Justin [name] is a 72 y.o. male who presents with Bipolar 1 Disorder, Mixed, Moderate and Dissociative Amnesia, Grief, and PTSD  Suicidal/Homicidal: No - without intent/plan  Therapist Response:  Justin [name] met with clinician for an individual session. Justin [name] discussed his psychiatric symptoms, his current life events and his homework. Justin [name] reports he has been feeling better overall. He has re-established a relationship with his sister and his son. He reported several instances where his son, daughter-in-law, and grandchildren have reached out to him, invited him over, and actively sought him out. Justin [name] reports this makes him feel good and very loved. He reports still having problems with his daughter, but he reports he has made some peace in his mind about her choices. Justin [name] reports he has decided to extend an olive branch and start reaching out to his family members in order to improve relationships. He also reports he attended a new years party at his son's house and stayed over 2 hours before having to leave due to anxiety.  Justin [name] identified concerns about his medication. He reports his anxiety continues to be problematic, but his dose was cut. This will be addressed by clinician with nurse  and Dr. Adele Schilder.   Plan: Return again in 1 -2 weeks.  Diagnosis:     Axis I: Bipolar 1 Disorder, Mixed, Moderate and Dissociative Amnesia, Grief, and PTSD  [name], LCSW 09/05/2017

## 2017-09-08 ENCOUNTER — Other Ambulatory Visit (HOSPITAL_COMMUNITY): Payer: Self-pay | Admitting: Psychiatry

## 2017-09-08 NOTE — Telephone Encounter (Signed)
Patient is taking a different strength of Zyprexa which was given on December with one more refill.  Soon to call the new prescription

## 2017-09-18 ENCOUNTER — Ambulatory Visit (HOSPITAL_COMMUNITY): Payer: Self-pay | Admitting: Psychiatry

## 2017-09-26 ENCOUNTER — Ambulatory Visit (HOSPITAL_COMMUNITY): Payer: Medicare Other | Admitting: Licensed Clinical Social Worker

## 2017-09-26 DIAGNOSIS — F3162 Bipolar disorder, current episode mixed, moderate: Secondary | ICD-10-CM

## 2017-09-26 DIAGNOSIS — F4310 Post-traumatic stress disorder, unspecified: Secondary | ICD-10-CM | POA: Diagnosis not present

## 2017-09-26 DIAGNOSIS — F440 Dissociative amnesia: Secondary | ICD-10-CM

## 2017-09-27 ENCOUNTER — Encounter (HOSPITAL_COMMUNITY): Payer: Self-pay | Admitting: Licensed Clinical Social Worker

## 2017-09-27 NOTE — Progress Notes (Signed)
THERAPIST PROGRESS NOTE  Session Time: 12:30pm-1:15pm  Participation Level: Active  Behavioral Response: Well GroomedAlertDysphoric  Type of Therapy: Individual Therapy  Treatment Goals addressed: improve psychiatric symptoms, Controlled Behavior, Moderated Mood, Deliberative Speech (improved social functioning), Improve Unhelpful Thought Patterns, Emotional Regulation Skills (Moderate moods, anger management, stress management), Feel and express a full Range of Emotions, Learn about Diagnosis, Healthy Coping Skills, Recall the Traumatic event without being overwhelmed    Interventions: CBT, Motivational Interviewing, grounding techniques and mindfulness techniques, and psychoeducation  Summary: Justin [name] is a 72 y.o. male who presents with Bipolar 1 Disorder, Mixed, Moderate and Dissociative Amnesia, Grief, and PTSD  Suicidal/Homicidal: No - without intent/plan  Therapist Response:  Justin [name] met with clinician for an individual session. Justin [name] discussed his psychiatric symptoms, his current life events and his homework. Justin [name] reports he has been feeling a bit better. He is no longer wearing his wrist brace and he has some more mobility. Justin [name] reports relationship with family has stabilized. His son got a puppy, which made him feel happy, but also guilty, and solidified his feelings that he was not yet ready to get another dog. Justin [name] reports he has been having trouble sleeping and his Zyprexa is not helping. He reports he has been staying up very late and oversleeping due to the medication not kicking in until several hours after taking it and lasting too long. He reports this sleep instability has increased irritability and some brief manic periods.    Plan: Return again in 1 -2 weeks.  Diagnosis:     Axis I: Bipolar 1 Disorder, Mixed, Moderate and Dissociative Amnesia, Grief, and PTSD  [name], LCSW 09/27/2017

## 2017-09-28 ENCOUNTER — Telehealth (HOSPITAL_COMMUNITY): Payer: Self-pay

## 2017-09-28 NOTE — Telephone Encounter (Signed)
He missed last week appointment with me.  He could try changing the timing of Zyprexa to get better sleep.  We will discuss more options on his next appointment.

## 2017-09-28 NOTE — Telephone Encounter (Signed)
Patient came in for therapy appointment and reported that the Zyprexa is not working. He is up most nights until 3 am and then sleeps all day. Please review and advise, thank you

## 2017-10-09 ENCOUNTER — Ambulatory Visit: Payer: Medicare Other | Admitting: Cardiology

## 2017-10-10 ENCOUNTER — Encounter (HOSPITAL_COMMUNITY): Payer: Self-pay | Admitting: Licensed Clinical Social Worker

## 2017-10-10 ENCOUNTER — Ambulatory Visit (INDEPENDENT_AMBULATORY_CARE_PROVIDER_SITE_OTHER): Payer: Medicare Other | Admitting: Licensed Clinical Social Worker

## 2017-10-10 DIAGNOSIS — F440 Dissociative amnesia: Secondary | ICD-10-CM

## 2017-10-10 DIAGNOSIS — F3162 Bipolar disorder, current episode mixed, moderate: Secondary | ICD-10-CM | POA: Diagnosis not present

## 2017-10-10 DIAGNOSIS — F4321 Adjustment disorder with depressed mood: Secondary | ICD-10-CM | POA: Diagnosis not present

## 2017-10-10 DIAGNOSIS — Z634 Disappearance and death of family member: Secondary | ICD-10-CM | POA: Diagnosis not present

## 2017-10-10 DIAGNOSIS — F4310 Post-traumatic stress disorder, unspecified: Secondary | ICD-10-CM

## 2017-10-10 NOTE — Progress Notes (Signed)
   THERAPIST PROGRESS NOTE  Session Time: 12:30pm-1:30pm  Participation Level: Active  Behavioral Response: Well GroomedAlertDepressed   Type of Therapy: Individual Therapy  Treatment Goals addressed: improve psychiatric symptoms, Controlled Behavior, Moderated Mood, Deliberative Speech (improved social functioning), Improve Unhelpful Thought Patterns, Emotional Regulation Skills (Moderate moods, anger management, stress management), Feel and express a full Range of Emotions, Learn about Diagnosis, Healthy Coping Skills, Recall the Traumatic event without being overwhelmed    Interventions: CBT, Motivational Interviewing, grounding techniques and mindfulness techniques, and psychoeducation  Summary: Roscoe Witts is a 72 y.o. male who presents with Bipolar 1 Disorder, Mixed, Moderate and Dissociative Amnesia, Grief, and PTSD  Suicidal/Homicidal: No - without intent/plan  Therapist Response:  Petro met with clinician for an individual session. Italo discussed his psychiatric symptoms, his current life events and his homework. Janie reports he has been feeling more depressed over the past few weeks due to the weather and the upcoming anniversary of his wife's death. Carvin reports he has not been sleeping more than 3-4 hours at a time and the Zyprexa has made him more irritable, increased mood swings, and sleep walking. Giuliano reports increase in nightmares, sleepwalking, and "fighting" in his sleep. Clinician explored ways to re-institute a daily routine, including a set sleep and wake time, daily exercise, routine meals, and social interactions. Mical continues to feel shame for his experience with dissociative amnesia. He also reports he is constantly reminded of his wife, which increases tearfulness and depression. Sylvain reports the loss of his dog has also compounded his grief, but he feels guilty about wanting to get another dog. Clinician confronted that belief and identified  that Kristapher's capacity for love multiplies, rather than divides.   Prabhav will see Dr. Adele Schilder on 10/11/17 and discuss medications. He reports he is very unhappy with Zyprexa. This was communicated with nurse as well.   Plan: Return again in 1 -2 weeks.  Diagnosis:     Axis I: Bipolar 1 Disorder, Mixed, Moderate and Dissociative Amnesia, Grief, and PTSD  [name], LCSW 10/10/2017

## 2017-10-11 ENCOUNTER — Encounter (HOSPITAL_COMMUNITY): Payer: Self-pay | Admitting: Psychiatry

## 2017-10-11 ENCOUNTER — Ambulatory Visit (INDEPENDENT_AMBULATORY_CARE_PROVIDER_SITE_OTHER): Payer: Medicare Other | Admitting: Psychiatry

## 2017-10-11 VITALS — BP 131/86 | HR 67 | Ht 69.0 in | Wt 178.0 lb

## 2017-10-11 DIAGNOSIS — R443 Hallucinations, unspecified: Secondary | ICD-10-CM | POA: Diagnosis not present

## 2017-10-11 DIAGNOSIS — Z915 Personal history of self-harm: Secondary | ICD-10-CM | POA: Diagnosis not present

## 2017-10-11 DIAGNOSIS — F4310 Post-traumatic stress disorder, unspecified: Secondary | ICD-10-CM | POA: Diagnosis not present

## 2017-10-11 DIAGNOSIS — F319 Bipolar disorder, unspecified: Secondary | ICD-10-CM | POA: Diagnosis not present

## 2017-10-11 DIAGNOSIS — Z810 Family history of intellectual disabilities: Secondary | ICD-10-CM | POA: Diagnosis not present

## 2017-10-11 DIAGNOSIS — F419 Anxiety disorder, unspecified: Secondary | ICD-10-CM | POA: Diagnosis not present

## 2017-10-11 DIAGNOSIS — Z9114 Patient's other noncompliance with medication regimen: Secondary | ICD-10-CM | POA: Diagnosis not present

## 2017-10-11 MED ORDER — ARIPIPRAZOLE 5 MG PO TABS: ORAL_TABLET | ORAL | 1 refills | Status: DC

## 2017-10-11 MED ORDER — TRAZODONE HCL 100 MG PO TABS: 100.0000 mg | ORAL_TABLET | Freq: Every evening | ORAL | 1 refills | Status: DC | PRN

## 2017-10-11 NOTE — Progress Notes (Signed)
BH MD/PA/NP OP Progress Note  10/11/2017 10:15 AM Justin [name]  MRN:  [id number]  Chief Complaint: I am not sleeping good.  I started to have bad dreams.    HPI: Patient came for his follow-up appointment earlier than his scheduled time.  He is experiencing increased anxiety, irritability, poor sleep and having bad dreams.  He does not feel Zyprexa working.  However he admitted noncompliant with trazodone past few weeks.  He is concerned about his nightmares and flashback.  He reported some time he is fighting in his sleep and he hit his arm on the wall which was recently got better.  Now he having again pain in his right hand.  He is taking Xanax 0.5 mg twice a day and Lamictal 150 mg twice a day.  He has no rash, itching tremors or shakes.  However he noticed that he is tired all day.  Even though he change the timing of Zyprexa but it did not help his sleep and next day he is lethargic and very tired.  Concerned that he is sleeping again into depression because now he is having paranoia and some time hallucinations.  His energy level is fair.  Denies any suicidal thoughts or homicidal thought.  He is seeing Janett Billow for therapy.  He admitted weight gain since the Christmas.  He mention his Christmas was very quiet but he was able to visit his son.  He endorsed whether also not helping his depression and he gets usually more depressed and cold and winter weather.  He admitted crying spells but denies any suicidal thoughts or homicidal thought.  Denies drinking alcohol or using any illegal substances.  Visit Diagnosis:    ICD-10-CM   1. PTSD (post-traumatic stress disorder) F43.10 traZODone (DESYREL) 100 MG tablet    ARIPiprazole (ABILIFY) 5 MG tablet    Past Psychiatric History: Reviewed. Patient has bipolar disorder with multiple hospitalizations.  Most of psychiatric hospitalization was due to mania, depression, suicidal attempt. He has taken overdose on his psychiatric medication including  Ambien, Vistaril and alcohol. His last psychiatric hospitalization was in October 2018 at [facility]. In the past he has taken Zyprexa, Cymbalta, Seroquel, Vistaril, Ambien, Neurontin,Wellbutrin,Xanaxand Trileptal he had he was briefly given Ativan but he believe having a reaction with Ativan and pain medication together. He did remember lithium cause side effects. He was seeing Dr. Letta Moynahan and Parthenia Ames. Patient has history of mania, psychosis, hallucination and severe. He finished intensive outpatient program depression.  Past Medical History:  Past Medical History:  Diagnosis Date  . Anxiety   . Arthritis   . Asthma   . Bipolar disorder ([place])   . CAD S/P percutaneous coronary stenting   . CHF (congestive heart failure) ([place])   . Chicken pox   . Colon polyps   . Depression   . 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[place])   . Grief   . Hx of adenomatous colonic polyps 06/22/2016  . Hyperlipidemia   . Hypertension   . Migraines   . NSTEMI (non-ST elevated myocardial infarction) ([place])   . PTSD (post-traumatic stress disorder)     Past Surgical History:  Procedure Laterality Date  . CERVICAL DISCECTOMY    . COLONOSCOPY    . ELBOW SURGERY Left   . ELBOW SURGERY Right   . INGUINAL HERNIA REPAIR Bilateral    1996, 1997  . KNEE ARTHROSCOPY Right   . KNEE CARTILAGE SURGERY Left   . NASAL SEPTUM SURGERY    .  STENT PLACEMENT VASCULAR ([place] [facility])  09/02/2010  . TONSILLECTOMY      Family Psychiatric History: Reviewed.  Family History:  Family History  Problem Relation Age of Onset  . Arthritis Mother   . Heart disease Mother   . Hypertension Mother   . Alzheimer's disease Mother   . Heart attack Mother   . Arthritis Father   . Hyperlipidemia Father   . Heart disease Father   . Stroke Father   . Hypertension Father   . Heart attack Father   . Multiple sclerosis Sister   . Diabetes Sister   . Lung cancer Paternal Grandmother   . Diabetes Sister   . Lung  cancer Maternal Aunt   . Lung cancer Paternal Aunt     Social History:  Social History   Socioeconomic History  . Marital status: Widowed    Spouse name: None  . Number of children: 2  . Years of education: 60  . Highest education level: None  Social Needs  . Financial resource strain: None  . Food insecurity - worry: None  . Food insecurity - inability: None  . Transportation needs - medical: None  . Transportation needs - non-medical: None  Occupational History  . Occupation: Retired  Tobacco Use  . Smoking status: Never Smoker  . Smokeless tobacco: Never Used  Substance and Sexual Activity  . Alcohol use: No    Alcohol/week: 0.0 oz  . Drug use: No  . Sexual activity: Not Currently  Other Topics Concern  . None  Social History Narrative   Retired, widowed in 2017    1 son one daughter   2 caffeinated beverages daily no alcohol or tobacco   Fun: Work out in the yard.   Denies religious beliefs effecting healthcare.     Allergies:  Allergies  Allergen Reactions  . Ambien [Zolpidem Tartrate] Other (See Comments)    Blackout, memory issues  . Seroquel [Quetiapine]     SEVERE NIGHTMARES, SLEEPWALK AND NIGHT DRIVE WITH NO RECOLLECTION UPON WAKENING    Metabolic Disorder Labs: Lab Results  Component Value Date   HGBA1C 4.5 (L) 06/19/2017   MPG 108 06/14/2017   Lab Results  Component Value Date   PROLACTIN 26.0 (H) 06/14/2017   Lab Results  Component Value Date   CHOL 150 06/14/2017   TRIG 116 06/14/2017   HDL 61 06/14/2017   CHOLHDL 2.5 06/14/2017   VLDL 23 06/14/2017   LDLCALC 66 06/14/2017   LDLCALC 55 03/26/2015   Lab Results  Component Value Date   TSH 1.189 06/14/2017   TSH 0.59 03/26/2015    Therapeutic Level Labs: No results found for: LITHIUM No results found for: VALPROATE No components found for:  CBMZ  Current Medications: Current Outpatient Medications  Medication Sig Dispense Refill  . ALPRAZolam (XANAX) 0.5 MG tablet Take 1  tablet (0.5 mg total) by mouth 2 (two) times daily as needed for anxiety. 60 tablet 0  . clopidogrel (PLAVIX) 75 MG tablet Take 1 tablet (75 mg total) by mouth daily. 30 tablet 0  . diltiazem (CARTIA XT) 180 MG 24 hr capsule Take 1 capsule (180 mg total) by mouth daily. 30 capsule 0  . lamoTRIgine (LAMICTAL) 150 MG tablet Take 2 tablets (300 mg total) by mouth daily. For mood control 60 tablet 1  . nitroGLYCERIN (NITROSTAT) 0.4 MG SL tablet     . OLANZapine (ZYPREXA) 10 MG tablet Take 1 tablet (10 mg total) by mouth at bedtime. For mood control  30 tablet 1  . rosuvastatin (CRESTOR) 20 MG tablet Take 20 mg by mouth daily.    . traZODone (DESYREL) 50 MG tablet Take 1 tablet (50 mg total) by mouth at bedtime as needed for sleep. 30 tablet 0   No current facility-administered medications for this visit.      Musculoskeletal: Strength & Muscle Tone: within normal limits Gait & Station: normal Patient leans: N/A  Psychiatric Specialty Exam: Review of Systems  Musculoskeletal:       Right wrist pain    Blood pressure 131/86, pulse 67, height 5\' 9"  (1.753 m), weight 178 lb (80.7 kg).Body mass index is 26.29 kg/m.  General Appearance: Casual  Eye Contact:  Fair  Speech:  Clear and Coherent  Volume:  Normal  Mood:  Anxious, Depressed and Dysphoric  Affect:  Congruent  Thought Process:  Goal Directed  Orientation:  Full (Time, Place, and Person)  Thought Content: Rumination   Suicidal Thoughts:  No  Homicidal Thoughts:  No  Memory:  Immediate;   Good Recent;   Good Remote;   Good  Judgement:  Good  Insight:  Good  Psychomotor Activity:  Decreased  Concentration:  Concentration: Fair and Attention Span: Fair  Recall:  Good  Fund of Knowledge: Good  Language: Good  Akathisia:  No  Handed:  Right  AIMS (if indicated): not done  Assets:  Communication Skills Desire for Improvement Housing Social Support  ADL's:  Intact  Cognition: WNL  Sleep:  Poor   Screenings: AIMS      Admission (Discharged) from 06/12/2017 in [facility]  AIMS Total Score  0    AUDIT     Admission (Discharged) from 06/12/2017 in [facility]  Alcohol Use Disorder Identification Test Final Score (AUDIT)  0    Mini-Mental     Office Visit from 03/26/2015 in [facility]  Total Score (max 30 points )  30    PHQ2-9     Office Visit from 06/09/2016 in [facility] from 01/13/2016 in [facility] Office Visit from 03/26/2015 in [facility]  PHQ-2 Total Score  2  6  0  PHQ-9 Total Score  8  26  No data       Assessment and Plan: Bipolar disorder type I.  Posttraumatic stress disorder.  Anxiety disorder NOS.  Patient has been noncompliant with trazodone which could be contributing to his insomnia but also he feel Zyprexa not helping.  I will discontinue Zyprexa and we will try Abilify which she has never tried before.  I will also encouraged to take the trazodone every night and we would increase the dose 200 mg to help insomnia.  Discussed medication side effects and benefits.  Continue Xanax 0.5 mg twice a day and Lamictal 300 mg daily.  Patient will see his orthopedic doctor for his right pain.  Encouraged CBT with Janett Billow.  Discussed safety concerns at any time having active suicidal thoughts or homicidal thought that he need to call 911 or go to local emergency room.  Follow-up in 3-4 weeks.  Time spent 25 minutes.  More than 50% of the time spent in psychoeducation, counseling and coordination of care.   Kathlee Nations, MD 10/11/2017, 10:15 AM

## 2017-10-23 ENCOUNTER — Ambulatory Visit (HOSPITAL_COMMUNITY): Payer: Medicare Other | Admitting: Psychiatry

## 2017-10-23 ENCOUNTER — Encounter (HOSPITAL_COMMUNITY): Payer: Self-pay | Admitting: Psychiatry

## 2017-10-23 DIAGNOSIS — F4310 Post-traumatic stress disorder, unspecified: Secondary | ICD-10-CM

## 2017-10-23 DIAGNOSIS — Z810 Family history of intellectual disabilities: Secondary | ICD-10-CM | POA: Diagnosis not present

## 2017-10-23 DIAGNOSIS — F3162 Bipolar disorder, current episode mixed, moderate: Secondary | ICD-10-CM | POA: Diagnosis not present

## 2017-10-23 DIAGNOSIS — F411 Generalized anxiety disorder: Secondary | ICD-10-CM | POA: Diagnosis not present

## 2017-10-23 MED ORDER — ARIPIPRAZOLE 5 MG PO TABS: ORAL_TABLET | ORAL | 1 refills | Status: DC

## 2017-10-23 MED ORDER — ALPRAZOLAM 0.5 MG PO TABS: 0.5000 mg | ORAL_TABLET | Freq: Two times a day (BID) | ORAL | 0 refills | Status: DC | PRN

## 2017-10-23 NOTE — Progress Notes (Signed)
BH MD/PA/NP OP Progress Note  10/23/2017 9:59 AM Justin [name]  MRN:  [id number]  Chief Complaint: I like new medication.  I am sleeping better.  HPI: Patient came for his follow-up appointment.  On his last visit we discontinue Zyprexa because he was feeling tired with lack of sleep and more irritable and depressed.  We started him on Abilify.  He is now taking Abilify 5 mg.  We also increase trazodone and now he is taking 100 mg.  He seems to be improving with Abilify.  He is less anxious less depressed and sleep is improved.  He has nightmares and dreams are gone.  He is not agitated irritable, paranoia or having any hallucination.  His energy level is improved.  He endorsed since taking Abilify 5 mg he is not fighting in his sleep.  He cut down his Xanax and takes it only as needed but he is very anxious and nervous.  He is compliant with Lamictal and has no rash, itching tremors or shakes.  Denies any recent crying spells.  He is more optimistic.  He had a scheduled to see his therapist tomorrow morning.  Patient denies drinking alcohol or using any illegal substances.  His appetite improved and he gained weight from the last visit and is happy about it.  Visit Diagnosis:    ICD-10-CM   1. Bipolar 1 disorder, mixed, moderate (HCC) F31.62   2. PTSD (post-traumatic stress disorder) F43.10 ARIPiprazole (ABILIFY) 5 MG tablet  3. Generalized anxiety disorder F41.1 ALPRAZolam (XANAX) 0.5 MG tablet    Past Psychiatric History: Reviewed. Patient has bipolar disorder withmultiple hospitalizations.Most of psychiatric hospitalization was due to mania, depression, suicidal attempt. He has taken overdose on his psychiatric medication including Ambien, Vistaril and alcohol. His last psychiatric hospitalization was inOctober 2018 [facility]. In the past he has taken Zyprexa, Cymbalta, Seroquel, Vistaril, Ambien, Neurontin,Wellbutrin,Xanaxand Trileptalhe had he was briefly given  Ativan but he believe having a reaction with Ativan and pain medication together. He did remember lithium cause side effects. He was seeing Dr. Letta Moynahan and Parthenia Ames. Patient has history of mania, psychosis, hallucination and severe. He finished intensive outpatient program depression.  Past Medical History:  Past Medical History:  Diagnosis Date  . Anxiety   . Arthritis   . Asthma   . Bipolar disorder ([place])   . CAD S/P percutaneous coronary stenting   . CHF (congestive heart failure) ([place])   . Chicken pox   . Colon polyps   . Depression   . 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 Grief   . Hx of adenomatous colonic polyps 06/22/2016  . Hyperlipidemia   . Hypertension   . Migraines   . NSTEMI (non-ST elevated myocardial infarction) ([place])   . PTSD (post-traumatic stress disorder)     Past Surgical History:  Procedure Laterality Date  . CERVICAL DISCECTOMY    . COLONOSCOPY    . ELBOW SURGERY Left   . ELBOW SURGERY Right   . INGUINAL HERNIA REPAIR Bilateral    1996, 1997  . KNEE ARTHROSCOPY Right   . KNEE CARTILAGE SURGERY Left   . NASAL SEPTUM SURGERY    . STENT PLACEMENT VASCULAR ([place] HX)  09/02/2010  . TONSILLECTOMY      Family Psychiatric History: Viewed.  Family History:  Family History  Problem Relation Age of Onset  . Arthritis Mother   . Heart disease Mother   . Hypertension Mother   . Alzheimer's disease Mother   .  Heart attack Mother   . Arthritis Father   . Hyperlipidemia Father   . Heart disease Father   . Stroke Father   . Hypertension Father   . Heart attack Father   . Multiple sclerosis Sister   . Diabetes Sister   . Lung cancer Paternal Grandmother   . Diabetes Sister   . Lung cancer Maternal Aunt   . Lung cancer Paternal Aunt     Social History:  Social History   Socioeconomic History  . Marital status: Widowed    Spouse name: None  . Number of children: 2  . Years of education: 50  . Highest education level: None  Social Needs   . Financial resource strain: None  . Food insecurity - worry: None  . Food insecurity - inability: None  . Transportation needs - medical: None  . Transportation needs - non-medical: None  Occupational History  . Occupation: Retired  Tobacco Use  . Smoking status: Never Smoker  . Smokeless tobacco: Never Used  Substance and Sexual Activity  . Alcohol use: No    Alcohol/week: 0.0 oz  . Drug use: No  . Sexual activity: Not Currently  Other Topics Concern  . None  Social History Narrative   Retired, widowed in 2017    1 son one daughter   2 caffeinated beverages daily no alcohol or tobacco   Fun: Work out in the yard.   Denies religious beliefs effecting healthcare.     Allergies:  Allergies  Allergen Reactions  . Ambien [Zolpidem Tartrate] Other (See Comments)    Blackout, memory issues  . Seroquel [Quetiapine]     SEVERE NIGHTMARES, SLEEPWALK AND NIGHT DRIVE WITH NO RECOLLECTION UPON WAKENING    Metabolic Disorder Labs: Lab Results  Component Value Date   HGBA1C 4.5 (L) 06/19/2017   MPG 108 06/14/2017   Lab Results  Component Value Date   PROLACTIN 26.0 (H) 06/14/2017   Lab Results  Component Value Date   CHOL 150 06/14/2017   TRIG 116 06/14/2017   HDL 61 06/14/2017   CHOLHDL 2.5 06/14/2017   VLDL 23 06/14/2017   LDLCALC 66 06/14/2017   LDLCALC 55 03/26/2015   Lab Results  Component Value Date   TSH 1.189 06/14/2017   TSH 0.59 03/26/2015    Therapeutic Level Labs: No results found for: LITHIUM No results found for: VALPROATE No components found for:  CBMZ  Current Medications: Current Outpatient Medications  Medication Sig Dispense Refill  . ALPRAZolam (XANAX) 0.5 MG tablet Take 1 tablet (0.5 mg total) by mouth 2 (two) times daily as needed for anxiety. 60 tablet 0  . ARIPiprazole (ABILIFY) 5 MG tablet Take 1/2 tab daily for 1 week and than take full tab daily 30 tablet 1  . clopidogrel (PLAVIX) 75 MG tablet Take 1 tablet (75 mg total) by mouth  daily. 30 tablet 0  . diltiazem (CARTIA XT) 180 MG 24 hr capsule Take 1 capsule (180 mg total) by mouth daily. 30 capsule 0  . lamoTRIgine (LAMICTAL) 150 MG tablet Take 2 tablets (300 mg total) by mouth daily. For mood control 60 tablet 1  . nitroGLYCERIN (NITROSTAT) 0.4 MG SL tablet     . rosuvastatin (CRESTOR) 20 MG tablet Take 20 mg by mouth daily.    . traZODone (DESYREL) 100 MG tablet Take 1 tablet (100 mg total) by mouth at bedtime as needed for sleep. 30 tablet 1   No current facility-administered medications for this visit.  Musculoskeletal: Strength & Muscle Tone: within normal limits Gait & Station: normal Patient leans: N/A  Psychiatric Specialty Exam: Review of Systems  Constitutional: Negative for weight loss.  HENT: Negative.   Musculoskeletal: Negative.   Skin: Negative.     Blood pressure 139/79, pulse 66, height 5\' 9"  (1.753 m), weight 182 lb (82.6 kg).Body mass index is 26.88 kg/m.  General Appearance: Casual  Eye Contact:  Good  Speech:  Clear and Coherent  Volume:  Normal  Mood:  Euthymic  Affect:  Appropriate  Thought Process:  Goal Directed  Orientation:  Full (Time, Place, and Person)  Thought Content: Logical   Suicidal Thoughts:  No  Homicidal Thoughts:  No  Memory:  Immediate;   Good  Judgement:  Good  Insight:  Good  Psychomotor Activity:  Normal  Concentration:  Concentration: Good and Attention Span: Good  Recall:  Good  Fund of Knowledge: Good  Language: Good  Akathisia:  No  Handed:  Right  AIMS (if indicated): not done  Assets:  Communication Skills Desire for Improvement Housing Resilience Social Support  ADL's:  Intact  Cognition: WNL  Sleep:  Good   Screenings: AIMS     Admission (Discharged) from 06/12/2017 in [place] Total Score  0    AUDIT     Admission (Discharged) from 06/12/2017 in [facility] [facility]  Alcohol Use Disorder Identification  Test Final Score (AUDIT)  0    Mini-Mental     Office Visit from 03/26/2015 in [place]  Total Score (max 30 points )  30    PHQ2-9     Office Visit from 06/09/2016 in [place] from 01/13/2016 in [place] Office Visit from 03/26/2015 in [facility]  PHQ-2 Total Score  2  6  0  PHQ-9 Total Score  8  26  No data       Assessment and Plan: Posttraumatic stress disorder.  Bipolar disorder type I.  Anxiety disorder NOS.  Patient doing much better with Abilify.  He has no longer nightmares and flashback.  He is seen improvement in his sleep and depression.  Continue Abilify 5 mg daily, Xanax 0.5 mg twice a day as needed for severe anxiety, trazodone 100 mg at bedtime and Lamictal 300 mg daily.  Patient has no rash, itching, tremors or shakes.  Discussed weight gain and recommend to watch his calorie intake and do regular exercise.  Encouraged to continue counseling with Janett Billow.  Follow-up in 6 weeks.  Recommended to call us back if he has any question, concern if you feel worsening of the symptoms.   Kathlee Nations, MD 10/23/2017, 9:59 AM

## 2017-10-24 ENCOUNTER — Encounter (HOSPITAL_COMMUNITY): Payer: Self-pay | Admitting: Licensed Clinical Social Worker

## 2017-10-24 ENCOUNTER — Ambulatory Visit (HOSPITAL_COMMUNITY): Payer: Medicare Other | Admitting: Licensed Clinical Social Worker

## 2017-10-24 DIAGNOSIS — F4310 Post-traumatic stress disorder, unspecified: Secondary | ICD-10-CM | POA: Diagnosis not present

## 2017-10-24 DIAGNOSIS — F4321 Adjustment disorder with depressed mood: Secondary | ICD-10-CM | POA: Diagnosis not present

## 2017-10-24 DIAGNOSIS — F3162 Bipolar disorder, current episode mixed, moderate: Secondary | ICD-10-CM

## 2017-10-24 NOTE — Progress Notes (Signed)
   THERAPIST PROGRESS NOTE  Session Time: 12:30pm-1:30pm  Participation Level: Active  Behavioral Response: Well GroomedAlertEuthymic  Type of Therapy: Individual Therapy  Treatment Goals addressed: improve psychiatric symptoms, Controlled Behavior, Moderated Mood, Deliberative Speech (improved social functioning), Improve Unhelpful Thought Patterns, Emotional Regulation Skills (Moderate moods, anger management, stress management), Feel and express a full Range of Emotions, Learn about Diagnosis, Healthy Coping Skills, Recall the Traumatic event without being overwhelmed   Interventions: CBT, Motivational Interviewing, grounding techniques and mindfulness techniques, and psychoeducation  Summary: Justin [name] is a 72 y.o. male who presents with Bipolar 1 Disorder, Mixed, Moderate, Grief, and PTSD  Suicidal/Homicidal: No - without intent/plan  Therapist Response:  Anselm met with clinician for an individual session. Thoms discussed his psychiatric symptoms, his current life events and his homework. Marcelles reports he has been feeling better because of new medication for sleep. Vishal reports he has been able to sleep up to 6-7 hours in a row without disruption, which has done wonders for his mood. Hajime reports he also is happy with Abilify, which has provided him a lift in his mood. Markel reports he continues to feel guilt over grieving his dog more than his wife. Clinician confronted that guilt and provided psychoeducation about the grief process. Clinician provided referral information for [facility] wellness academy in order for [place] to increase his social network and to give him something to do outside of the house. Albin reported increase in boredom and loneliness.   Plan: Return again in 1 -2 weeks.  Diagnosis:     Axis I: Bipolar 1 Disorder, Mixed, Moderate, Grief, and PTSD  [name], LCSW 10/24/2017

## 2017-10-30 DIAGNOSIS — E785 Hyperlipidemia, unspecified: Secondary | ICD-10-CM | POA: Insufficient documentation

## 2017-10-30 NOTE — Progress Notes (Signed)
Cardiology Office Note:    Date:  10/31/2017   ID:  Justin [name], DOB Oct 24, 1945, MRN [id number]  PCP:  Justin Borg, MD  Cardiologist: Justin [name]   Referring MD: Justin Borg, MD   Chief Complaint  Patient presents with  . Coronary Artery Disease    establish  . Shortness of Breath    History of Present Illness:    Justin [name] is a 72 y.o. male with coronary artery disease s/p prior PCI and prior NSTEMI, hypertension, hyperlipidemia who is being seen today to establish with our practice at the request of Justin Borg, MD.   Justin [name] was previously seen by Justin [name] in [place].  According to his records, he had PCI with DES to the LAD and Dx in 2011 and suffered a NSTEMI in 03/2011 with no culprit on catheterization.  Therefore, he was treated medically.  Notes also suggest he had a negative stress echo in February 2017.  He was last seen in [place] [facility] in June or 2019 by Justin [name].  However, he decided to establish with our practice.  He does have a history of fairly stable anginal symptoms since his stenting.  These symptoms typically occur with emotional stress as well as with exertion.  He is able to do other types of exertion without chest discomfort.  He last took nitroglycerin with relief about 2 weeks ago.  Overall, his anginal pattern has not changed.  However, he has noted increasing shortness of breath over the past several months.  He did gain 20 pounds recently.  He attributes his shortness of breath to weight gain.  He denies orthopnea, PND or edema.  He denies syncope.  He denies any claudication symptoms.  PAD Screen 10/31/2017  Previous PAD dx? No  Previous surgical procedure? No  Pain with walking? Yes  Subsides with rest? Yes  Feet/toe relief with dangling? No  Painful, non-healing ulcers? No  Extremities discolored? No    Prior CV studies:   The following studies were reviewed today:  No records available  Past  Medical History:  Diagnosis Date  . Anxiety and depression   . Arthritis    knees, c-spine // gets lumbar ESI q 6 mos  . Bipolar disorder ([place])   . CAD (coronary artery disease)    S/p DES to LAD and Diagonal in 08/2009 // S/p NSTEMI in 8/12 without culprit lesion treated medically // Stress Echo in 2/17 reportedly negative  . Chicken pox   . 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 Grief   . History of non-ST elevation myocardial infarction (NSTEMI)   . History of pneumonia 2011  . History of prostatitis 1990s  . Hx of adenomatous colonic polyps 06/22/2016  . Hyperlipidemia   . Hypertension   . Migraines    prior history  . PTSD (post-traumatic stress disorder)     Past Surgical History:  Procedure Laterality Date  . CERVICAL DISCECTOMY    . COLONOSCOPY  03/2017  . ELBOW SURGERY Left   . ELBOW SURGERY Right   . INGUINAL HERNIA REPAIR Bilateral    1996, 1997  . KNEE ARTHROSCOPY Right   . KNEE CARTILAGE SURGERY Left   . NASAL SEPTUM SURGERY    . STENT PLACEMENT VASCULAR ([place] HX)  09/02/2010  . TONSILLECTOMY      Current Medications: Current Meds  Medication Sig  . ALPRAZolam (XANAX) 0.5 MG tablet Take 1 tablet (0.5 mg total) by mouth  2 (two) times daily as needed for anxiety.  . ARIPiprazole (ABILIFY) 5 MG tablet Take 5 mg by mouth daily.  . clopidogrel (PLAVIX) 75 MG tablet Take 1 tablet (75 mg total) by mouth daily.  Marland Kitchen diltiazem (CARTIA XT) 180 MG 24 hr capsule Take 1 capsule (180 mg total) by mouth daily.  Marland Kitchen lamoTRIgine (LAMICTAL) 150 MG tablet Take 2 tablets (300 mg total) by mouth daily. For mood control  . nitroGLYCERIN (NITROSTAT) 0.4 MG SL tablet Place 0.4 mg under the tongue every 5 (five) minutes as needed for chest pain.   . rosuvastatin (CRESTOR) 20 MG tablet Take 20 mg by mouth daily.  . traZODone (DESYREL) 100 MG tablet Take 1 tablet (100 mg total) by mouth at bedtime as needed for sleep.     Allergies:   Ambien [zolpidem tartrate] and Seroquel [quetiapine]    Social History   Socioeconomic History  . Marital status: Widowed    Spouse name: None  . Number of children: 2  . Years of education: 23  . Highest education level: None  Social Needs  . Financial resource strain: None  . Food insecurity - worry: None  . Food insecurity - inability: None  . Transportation needs - medical: None  . Transportation needs - non-medical: None  Occupational History  . Occupation: Retired  Tobacco Use  . Smoking status: Never Smoker  . Smokeless tobacco: Never Used  Substance and Sexual Activity  . Alcohol use: No    Alcohol/week: 0.0 oz  . Drug use: No  . Sexual activity: Not Currently  Other Topics Concern  . None  Social History Narrative   Retired, widowed in 2017    1 son / 1 daughter   2 caffeinated beverages daily no alcohol or tobacco   Fun: Work out in the yard.   Denies religious beliefs effecting healthcare.    Worked at [employer] for 30 years     Family Hx: The patient's family history includes Alzheimer's disease in his mother; Arthritis in his father and mother; Depression in his brother; Diabetes in his sister and sister; Heart attack in his father, mother, and sister; Heart disease in his father and mother; Hyperlipidemia in his father; Hypertension in his father and mother; Lung cancer in his maternal aunt, paternal aunt, and paternal grandmother; Multiple sclerosis in his sister; Pulmonary embolism in his sister; Stroke in his father.  ROS:   Please see the history of present illness.    ROS All other systems reviewed and are negative.   EKGs/Labs/Other Test Reviewed:    EKG:  EKG is   ordered today.  The ekg ordered today demonstrates normal sinus rhythm, heart rate 65, left axis deviation, incomplete RBBB, QTC 409 ms  Recent Labs: 06/09/2017: ALT 35; BUN 15; Creatinine, Ser 0.88; Potassium 3.7; Sodium 133 06/14/2017: TSH 1.189 06/19/2017: Hemoglobin 14.1; Platelets 335.0   Recent Lipid Panel Lab Results   Component Value Date/Time   CHOL 150 06/14/2017 06:29 AM   TRIG 116 06/14/2017 06:29 AM   HDL 61 06/14/2017 06:29 AM   CHOLHDL 2.5 06/14/2017 06:29 AM   LDLCALC 66 06/14/2017 06:29 AM    Physical Exam:    VS:  BP 140/80 (BP Location: Left Arm, Patient Position: Sitting, Cuff Size: Normal)   Pulse 65   Ht 5\' 10"  (1.778 m)   Wt 187 lb (84.8 kg)   BMI 26.83 kg/m     Wt Readings from Last 3 Encounters:  10/31/17 187 lb (84.8 kg)  10/23/17 182 lb (82.6 kg)  10/11/17 178 lb (80.7 kg)     Physical Exam  Constitutional: He is oriented to person, place, and time. He appears well-developed and well-nourished. No distress.  HENT:  Head: Normocephalic and atraumatic.  Neck: No JVD present. Carotid bruit is not present.  Cardiovascular: Normal rate and regular rhythm.  No murmur heard. Pulmonary/Chest: Effort normal. He has no rales.  Abdominal: Soft. He exhibits no mass.  Musculoskeletal: He exhibits no edema.  Neurological: He is alert and oriented to person, place, and time.  Skin: Skin is warm and dry.    ASSESSMENT & PLAN:    #1.  Coronary artery disease with angina pectoris ([place])  History of stenting to the LAD and diagonal in 2011.  History of non-ST elevation myocardial infarction in 2012 without culprit lesion, treated medically.  Last stress test reportedly, in 2017, was low risk.  He does have a chronic anginal pattern that has been fairly stable.  His ECG is unchanged.  He has noted shortness of breath over the last few months.  As it has been 2 years since his last assessment for ischemia, I have recommended proceeding with stress testing.  I have reviewed his case today with Dr. Tamala Julian (attending MD).  -Obtain records from [facility]  -Arrange exercise nuclear stress test  -Continue clopidogrel, diltiazem, rosuvastatin  #2.  Shortness of breath Etiology not entirely clear.  He does not have any signs of volume excess on exam.  He does note recent weight gain which may  explain his shortness of breath.  Proceed with stress testing as outlined above.  I Justin also obtain an echocardiogram.  -Obtain echo to assess LV function, right heart pressures  #3.  Essential hypertension Blood pressure above target.  I have asked him to continue to monitor this.  If it remains above target, consider adding ACE inhibitor given prior history of myocardial infarction.  #4.  Hyperlipidemia, unspecified hyperlipidemia type LDL optimal on most recent lab work.  Continue current Rx.    Dispo:  Return in about 6 months (around 05/03/2018) for Routine Follow Up, w/ Richardson Dopp, PA-C.  I Justin establish him with Dr. Harrell Gave End on my Care Team.   Medication Adjustments/Labs and Tests Ordered: Current medicines are reviewed at length with the patient today.  Concerns regarding medicines are outlined above.  Orders/Tests:  Orders Placed This Encounter  Procedures  . Myocardial Perfusion Imaging  . EKG 12-Lead  . ECHOCARDIOGRAM COMPLETE   Medication changes: No orders of the defined types were placed in this encounter.  Signed, Richardson Dopp, PA-C  10/31/2017 10:15 AM    [facility] [address] [place], [place], [place]  [address] Phone: [phone]; Fax: [phone]

## 2017-10-31 ENCOUNTER — Encounter: Payer: Self-pay | Admitting: Physician Assistant

## 2017-10-31 ENCOUNTER — Other Ambulatory Visit: Payer: Self-pay | Admitting: *Deleted

## 2017-10-31 ENCOUNTER — Encounter: Payer: Self-pay | Admitting: *Deleted

## 2017-10-31 ENCOUNTER — Ambulatory Visit: Payer: Medicare Other | Admitting: Physician Assistant

## 2017-10-31 VITALS — BP 140/80 | HR 65 | Ht 70.0 in | Wt 187.0 lb

## 2017-10-31 DIAGNOSIS — R0602 Shortness of breath: Secondary | ICD-10-CM

## 2017-10-31 DIAGNOSIS — E785 Hyperlipidemia, unspecified: Secondary | ICD-10-CM

## 2017-10-31 DIAGNOSIS — I10 Essential (primary) hypertension: Secondary | ICD-10-CM | POA: Diagnosis not present

## 2017-10-31 DIAGNOSIS — I25119 Atherosclerotic heart disease of native coronary artery with unspecified angina pectoris: Secondary | ICD-10-CM

## 2017-10-31 MED ORDER — ROSUVASTATIN CALCIUM 20 MG PO TABS: 20.0000 mg | ORAL_TABLET | Freq: Every day | ORAL | 11 refills | Status: DC

## 2017-10-31 NOTE — Patient Instructions (Signed)
Medication Instructions:  1. A REFILL HAS BEEN SENT IN FOR CRESTOR   Labwork: NONE ORDERED TODAY  Testing/Procedures: Your physician has requested that you have en exercise stress myoview. For further information please visit [email or web address]. Please follow instruction sheet, as given.    Follow-Up: Your physician wants you to follow-up in: [place], [facility]  You will receive a reminder letter in the mail two months in advance. If you don't receive a letter, please call our office to schedule the follow-up appointment.   Any Other Special Instructions Will Be Listed Below (If Applicable).     If you need a refill on your cardiac medications before your next appointment, please call your pharmacy.

## 2017-11-02 ENCOUNTER — Encounter: Payer: Self-pay | Admitting: Physician Assistant

## 2017-11-07 ENCOUNTER — Ambulatory Visit (INDEPENDENT_AMBULATORY_CARE_PROVIDER_SITE_OTHER): Payer: Medicare Other | Admitting: Licensed Clinical Social Worker

## 2017-11-07 ENCOUNTER — Encounter (HOSPITAL_COMMUNITY): Payer: Self-pay | Admitting: Licensed Clinical Social Worker

## 2017-11-07 DIAGNOSIS — F4310 Post-traumatic stress disorder, unspecified: Secondary | ICD-10-CM

## 2017-11-07 DIAGNOSIS — F4321 Adjustment disorder with depressed mood: Secondary | ICD-10-CM

## 2017-11-07 DIAGNOSIS — F3162 Bipolar disorder, current episode mixed, moderate: Secondary | ICD-10-CM

## 2017-11-07 NOTE — Progress Notes (Signed)
   THERAPIST PROGRESS NOTE  Session Time: 12:30pm-1:30pm  Participation Level: Active  Behavioral Response: Well GroomedAlertDepressed  Type of Therapy: Individual Therapy  Treatment Goals addressed: improve psychiatric symptoms, Controlled Behavior, Moderated Mood, Deliberative Speech (improved social functioning), Improve Unhelpful Thought Patterns, Emotional Regulation Skills (Moderate moods, anger management, stress management), Feel and express a full Range of Emotions, Learn about Diagnosis, Healthy Coping Skills, Recall the Traumatic event without being overwhelmed    Interventions: CBT, Motivational Interviewing, grounding techniques and mindfulness techniques, and psychoeducation  Summary: Justin [name] is a 72 y.o. male who presents with Bipolar 1 Disorder, Mixed, Moderate, Grief, and PTSD  Suicidal/Homicidal: No - without intent/plan  Therapist Response:  [name] met with clinician for an individual session. [name] discussed his psychiatric symptoms, his current life events and his homework. [name] presented very irritable, hyper, and manic. He reports he has not been able to sleep for a few days and has not been able to sit still, even though he is very tired. [name] expressed depression and irritability due to his response to the anniversary of his wife's death. Clinician utilized MI OARS to reflect thoughts and feelings. Clinician also normalized current state and reminded [name] that we had planned for this day to be very hard. Clinician urged [name] to do something loving that reminds him of his wife, such as planting flowers, feeding the birds, eating at her favorite restaurant, in order to reclaim the day with happiness rather than depression. [name] continued to revert to anger that he was unable to remember things when she died. Clinician reminded [name] that that information may be more detrimental and traumatic to him than helpful.  Clinician assisted in planning for  the remainder of the day's activities. [name] reports he will go to orientation for [employer] this afternoon.   Plan: Return again in 1 -2 weeks.  Diagnosis:     Axis I: Bipolar 1 Disorder, Mixed, Moderate, Grief, and PTSD  [name], LCSW 11/07/2017

## 2017-11-14 ENCOUNTER — Telehealth (HOSPITAL_COMMUNITY): Payer: Self-pay | Admitting: *Deleted

## 2017-11-14 NOTE — Telephone Encounter (Signed)
Patient given detailed instructions per Myocardial Perfusion Study Information Sheet for the test on 11/17/17 at 0915. Patient notified to arrive 15 minutes early and that it is imperative to arrive on time for appointment to keep from having the test rescheduled.  If you need to cancel or reschedule your appointment, please call the office within 24 hours of your appointment. . Patient verbalized understanding.Justin [name], Justin [name]

## 2017-11-17 ENCOUNTER — Ambulatory Visit (HOSPITAL_BASED_OUTPATIENT_CLINIC_OR_DEPARTMENT_OTHER): Payer: Medicare Other

## 2017-11-17 ENCOUNTER — Ambulatory Visit (HOSPITAL_COMMUNITY): Payer: Medicare Other | Attending: Cardiovascular Disease

## 2017-11-17 ENCOUNTER — Other Ambulatory Visit: Payer: Self-pay

## 2017-11-17 ENCOUNTER — Encounter: Payer: Self-pay | Admitting: Physician Assistant

## 2017-11-17 DIAGNOSIS — E782 Mixed hyperlipidemia: Secondary | ICD-10-CM | POA: Insufficient documentation

## 2017-11-17 DIAGNOSIS — R0602 Shortness of breath: Secondary | ICD-10-CM

## 2017-11-17 DIAGNOSIS — I10 Essential (primary) hypertension: Secondary | ICD-10-CM | POA: Insufficient documentation

## 2017-11-17 DIAGNOSIS — F319 Bipolar disorder, unspecified: Secondary | ICD-10-CM | POA: Diagnosis not present

## 2017-11-17 DIAGNOSIS — I2510 Atherosclerotic heart disease of native coronary artery without angina pectoris: Secondary | ICD-10-CM | POA: Diagnosis present

## 2017-11-17 DIAGNOSIS — F4310 Post-traumatic stress disorder, unspecified: Secondary | ICD-10-CM | POA: Diagnosis not present

## 2017-11-17 DIAGNOSIS — R5383 Other fatigue: Secondary | ICD-10-CM | POA: Insufficient documentation

## 2017-11-17 DIAGNOSIS — G43909 Migraine, unspecified, not intractable, without status migrainosus: Secondary | ICD-10-CM | POA: Diagnosis not present

## 2017-11-17 DIAGNOSIS — Z8701 Personal history of pneumonia (recurrent): Secondary | ICD-10-CM | POA: Insufficient documentation

## 2017-11-17 DIAGNOSIS — I082 Rheumatic disorders of both aortic and tricuspid valves: Secondary | ICD-10-CM | POA: Insufficient documentation

## 2017-11-17 DIAGNOSIS — I252 Old myocardial infarction: Secondary | ICD-10-CM | POA: Diagnosis not present

## 2017-11-17 LAB — MYOCARDIAL PERFUSION IMAGING
CHL CUP NUCLEAR SRS: 11
CHL CUP NUCLEAR SSS: 16
CSEPEW: 9.9 METS
CSEPHR: 91 %
CSEPPHR: 136 {beats}/min
Exercise duration (min): 8 min
Exercise duration (sec): 15 s
LV dias vol: 102 mL (ref 62–150)
LV sys vol: 44 mL
MPHR: 149 {beats}/min
RATE: 0.32
Rest HR: 59 {beats}/min
SDS: 5
TID: 0.79

## 2017-11-17 MED ORDER — TECHNETIUM TC 99M TETROFOSMIN IV KIT
32.5000 | PACK | Freq: Once | INTRAVENOUS | Status: AC | PRN
  Administered 2017-11-17: 32.5 via INTRAVENOUS
  Filled 2017-11-17: qty 33

## 2017-11-17 MED ORDER — TECHNETIUM TC 99M TETROFOSMIN IV KIT
10.1000 | PACK | Freq: Once | INTRAVENOUS | Status: AC | PRN
  Administered 2017-11-17: 10.1 via INTRAVENOUS
  Filled 2017-11-17: qty 11

## 2017-11-18 ENCOUNTER — Other Ambulatory Visit (HOSPITAL_COMMUNITY): Payer: Self-pay | Admitting: Psychiatry

## 2017-11-18 DIAGNOSIS — F319 Bipolar disorder, unspecified: Secondary | ICD-10-CM

## 2017-11-19 ENCOUNTER — Encounter: Payer: Self-pay | Admitting: Physician Assistant

## 2017-11-20 ENCOUNTER — Telehealth: Payer: Self-pay | Admitting: *Deleted

## 2017-11-20 ENCOUNTER — Other Ambulatory Visit (HOSPITAL_COMMUNITY): Payer: Self-pay | Admitting: Psychiatry

## 2017-11-20 NOTE — Telephone Encounter (Signed)
Pt has been notified of both Myoview and Echo results by phone with verbal understanding to both test results. Pt thanked me for my call today. I will forward a copy of results to PCP DR. Cathlean Cower.

## 2017-11-20 NOTE — Telephone Encounter (Signed)
No longer on zyprexa

## 2017-11-20 NOTE — Telephone Encounter (Signed)
-----   Message from Liliane Shi, [place] sent at 11/19/2017  7:46 PM EDT ----- Please call the patient. Stress test demonstrates normal LV function.  There is some interference from his gut.  But, there is no ischemia. Continue current medications and follow up as planned.  Please fax a copy of this study result to his PCP:  Biagio Borg, MD  Thanks! Richardson Dopp, PA-C    11/19/2017 7:41 PM

## 2017-11-21 ENCOUNTER — Telehealth (HOSPITAL_COMMUNITY): Payer: Self-pay

## 2017-11-21 ENCOUNTER — Ambulatory Visit (HOSPITAL_COMMUNITY): Payer: Medicare Other | Admitting: Licensed Clinical Social Worker

## 2017-11-21 ENCOUNTER — Encounter (HOSPITAL_COMMUNITY): Payer: Self-pay | Admitting: Licensed Clinical Social Worker

## 2017-11-21 DIAGNOSIS — F411 Generalized anxiety disorder: Secondary | ICD-10-CM

## 2017-11-21 DIAGNOSIS — F4321 Adjustment disorder with depressed mood: Secondary | ICD-10-CM | POA: Diagnosis not present

## 2017-11-21 DIAGNOSIS — F3162 Bipolar disorder, current episode mixed, moderate: Secondary | ICD-10-CM | POA: Diagnosis not present

## 2017-11-21 MED ORDER — ALPRAZOLAM 0.5 MG PO TABS: 0.5000 mg | ORAL_TABLET | Freq: Two times a day (BID) | ORAL | 0 refills | Status: DC | PRN

## 2017-11-21 NOTE — Telephone Encounter (Signed)
Refilled medication per Dr. Adele Schilder

## 2017-11-21 NOTE — Progress Notes (Signed)
   THERAPIST PROGRESS NOTE  Session Time: 1:30pm-2:30pm  Participation Level: Active  Behavioral Response: Well GroomedAlertEuthymic  Type of Therapy: Individual Therapy  Treatment Goals addressed: improve psychiatric symptoms, Controlled Behavior, Moderated Mood, Deliberative Speech (improved social functioning), Improve Unhelpful Thought Patterns, Emotional Regulation Skills (Moderate moods, anger management, stress management), Feel and express a full Range of Emotions, Learn about Diagnosis, Healthy Coping Skills, Recall the Traumatic event without being overwhelmed    Interventions: CBT, Motivational Interviewing, grounding techniques and mindfulness techniques, and psychoeducation  Summary: Justin [name] is a 72 y.o. male who presents with Bipolar 1 Disorder, Mixed, Moderate and Dissociative Amnesia, Grief, and PTSD  Suicidal/Homicidal: No - without intent/plan  Therapist Response:  Eion met with clinician for an individual session. Rashaad discussed his psychiatric symptoms, his current life events and his homework. Zaire discussed previous session and again identified that he is not "looking for attention" as a secondary gain. Clinician assisted in clarifying goal of that intervention in order to elicit an emotional response. Clinician discussed the value of expressing anger at times to get the emotional energy out. Tegh reported on the day of the previous session, he also visited his sister in [facility], who happened to be on the same floor as his wife was when she died. Maejor reported he experienced a catharsis moment and believed that his wife was there, telling him that it was going to be alright and that he could let it go. Justin reports since that day he has felt better, sleep has improved, and anxiety has decreased. Jomarion reports he needed to believe that it was okay to lighten his load of the grief and he is now in the acceptance phase of his grief process.   Vernie reports he attended intake at [facility], but is not interested in participating at this time.   Plan: Return again in 1 -2 weeks.  Diagnosis:     Axis I: Bipolar 1 Disorder, Mixed, Moderate and Dissociative Amnesia, Grief, and PTSD  [name], LCSW 11/21/2017

## 2017-11-21 NOTE — Telephone Encounter (Signed)
Ok to refill 

## 2017-11-21 NOTE — Telephone Encounter (Signed)
Received a refill request from pharmacy for Xanax 0.5mg . Next appointment is 12/06/17. Please advise

## 2017-12-05 ENCOUNTER — Ambulatory Visit (INDEPENDENT_AMBULATORY_CARE_PROVIDER_SITE_OTHER): Payer: Medicare Other | Admitting: Licensed Clinical Social Worker

## 2017-12-05 ENCOUNTER — Encounter (HOSPITAL_COMMUNITY): Payer: Self-pay | Admitting: Licensed Clinical Social Worker

## 2017-12-05 DIAGNOSIS — F3162 Bipolar disorder, current episode mixed, moderate: Secondary | ICD-10-CM | POA: Diagnosis not present

## 2017-12-05 DIAGNOSIS — F4321 Adjustment disorder with depressed mood: Secondary | ICD-10-CM

## 2017-12-05 NOTE — Progress Notes (Signed)
   THERAPIST PROGRESS NOTE  Session Time: 12:30pm-1:30pm  Participation Level: Active  Behavioral Response: Well GroomedAlertEuthymic  Type of Therapy: Individual Therapy  Treatment Goals addressed: improve psychiatric symptoms, Controlled Behavior, Moderated Mood, Deliberative Speech (improved social functioning), Improve Unhelpful Thought Patterns, Emotional Regulation Skills (Moderate moods, anger management, stress management), Feel and express a full Range of Emotions, Learn about Diagnosis, Healthy Coping Skills, Recall the Traumatic event without being overwhelmed    Interventions: CBT, Motivational Interviewing, grounding techniques and mindfulness techniques, and psychoeducation  Summary: Justin [name] is a 73 y.o. male who presents with Bipolar 1 Disorder, Mixed, Moderate and Dissociative Amnesia, Grief, and PTSD  Suicidal/Homicidal: No - without intent/plan  Therapist Response:  Justin [name] met with clinician for an individual session. Justin [name] discussed his psychiatric symptoms, his current life events and his homework. Justin [name] reports he has been feeling better overall, enjoying life, and trying to focus on the positive. Justin [name] reports increased interactions and involvement with his granddaughters. He also reports feeling a sense of closure with the loss of his wife. Clinician utilized MI OARS to reflect and summarize thoughts and feelings about his stages of grief and loss. Justin [name] reported ongoing guilt and grief over the loss of his dog and no future plans to get a new dog. Clinician reassured Justin [name] and noted the importance of not beating himself up about the loss of his dog. Clinician provided psychoeducation about the stages of grief and our tendency to go back and forth between each stage prior to acceptance. However, clinician encouraged Justin [name] to shift his focus from depression to joy in the memories. Clinician also identified the primary problems of loneliness and encouraged  Justin [name] to remain active with his grandchildren.   Plan: Return again in 1 -2 weeks.  Diagnosis:     Axis I: Bipolar 1 Disorder, Mixed, Moderate and Dissociative Amnesia, Grief, and PTSD  [name], LCSW 12/05/2017

## 2017-12-06 ENCOUNTER — Ambulatory Visit (INDEPENDENT_AMBULATORY_CARE_PROVIDER_SITE_OTHER): Payer: Medicare Other | Admitting: Psychiatry

## 2017-12-06 ENCOUNTER — Encounter (HOSPITAL_COMMUNITY): Payer: Self-pay | Admitting: Psychiatry

## 2017-12-06 DIAGNOSIS — Z810 Family history of intellectual disabilities: Secondary | ICD-10-CM

## 2017-12-06 DIAGNOSIS — F411 Generalized anxiety disorder: Secondary | ICD-10-CM

## 2017-12-06 DIAGNOSIS — F4310 Post-traumatic stress disorder, unspecified: Secondary | ICD-10-CM

## 2017-12-06 DIAGNOSIS — Z818 Family history of other mental and behavioral disorders: Secondary | ICD-10-CM

## 2017-12-06 DIAGNOSIS — F3162 Bipolar disorder, current episode mixed, moderate: Secondary | ICD-10-CM | POA: Diagnosis not present

## 2017-12-06 MED ORDER — TRAZODONE HCL 100 MG PO TABS: 100.0000 mg | ORAL_TABLET | Freq: Every evening | ORAL | 0 refills | Status: DC | PRN

## 2017-12-06 MED ORDER — LAMOTRIGINE 150 MG PO TABS: 300.0000 mg | ORAL_TABLET | Freq: Every day | ORAL | 1 refills | Status: DC

## 2017-12-06 MED ORDER — ARIPIPRAZOLE 5 MG PO TABS: 5.0000 mg | ORAL_TABLET | Freq: Every day | ORAL | 2 refills | Status: DC

## 2017-12-06 MED ORDER — ALPRAZOLAM 0.5 MG PO TABS: 0.5000 mg | ORAL_TABLET | Freq: Two times a day (BID) | ORAL | 0 refills | Status: DC | PRN

## 2017-12-06 NOTE — Progress Notes (Signed)
BH MD/PA/NP OP Progress Note  12/06/2017 10:48 AM Justin [name]  MRN:  [id number]  Chief Complaint: I am doing better.  I am sleeping better.  Some nights I do not take trazodone.  HPI: She came for his follow-up appointment.  He is taking Abilify Lamictal every day.  Some days he takes trazodone for sleep and he takes Xanax only as needed for anxiety.  Overall he describes his depression is much better.  He is seeing Janett Billow regularly.  He denies any major panic attack or nightmares.  He has no tremors shakes or any EPS.  His wife's death anniversary recently passed and he handle it much better.  He has no rash, itching or tremors.  He denies any crying spells.  He is now more optimistic.  He like the Abilify better than Zyprexa.  He lost weight as he is more engaged in outdoor activities.  He tried to spend time in the backyard and keep himself busy.  Patient denies any hallucination, paranoia, suicidal thoughts or homicidal thought.  Denies drinking alcohol or using any illegal substances.  Visit Diagnosis:    ICD-10-CM   1. Bipolar 1 disorder, mixed, moderate (HCC) F31.62 lamoTRIgine (LAMICTAL) 150 MG tablet  2. PTSD (post-traumatic stress disorder) F43.10 lamoTRIgine (LAMICTAL) 150 MG tablet    traZODone (DESYREL) 100 MG tablet  3. Generalized anxiety disorder F41.1 ALPRAZolam (XANAX) 0.5 MG tablet    Past Psychiatric History: Reviewed Patient has bipolar disorder with multiple hospitalization.  He has a history of suicidal attempt in the past by taking overdose on Ambien, Vistaril and alcohol.  [facility]  He tried Zyprexa, Cymbalta, Seroquel, Vistaril, Ambien, Neurontin, Wellbutrin, Trileptal.  He had a reaction with Ativan and pain medication.  He was seeing Dr. Letta Moynahan in the past.  Patient also done intensive outpatient program.  Past Medical History:  Past Medical History:  Diagnosis Date  . Anxiety and depression   .  Arthritis    knees, c-spine // gets lumbar ESI q 6 mos  . Bipolar disorder ([place])   . CAD (coronary artery disease)    [place] 08/2009 >> [facility] ([facility]) - mLAD 70, Dx 65 >> PCI:  3x15 mm Endeavor DES to mLAD and 2.5x12 mm Endeavor DES to Dx // S/p NSTEMI >> LHC 8/12 ([facility]) - LM ok, LAD prox and mid 40; LAD stent ok, Dx stent ok, mRCA 30, mLCx 50 >> med Rx // ETT-Echo 2/17 ([facility]): Normal, EF 55-60 at rest  . Chicken pox   . 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 Grief   . History of echocardiogram    Echo 3/19: Mild concentric LVH, EF 60-65, normal wall motion, grade 1 diastolic dysfunction, mild AI, mildly dilated aortic root (40 mm), MAC, mild LAE, atrial septal lipomatous hypertrophy  . History of non-ST elevation myocardial infarction (NSTEMI)   . History of nuclear stress test    Nuclear stress test 3/19: EF 57, inferior/inferoseptal/inferolateral defect consistent with probable soft tissue attenuation (cannot exclude subendocardial scar), no ischemia, intermediate risk >> Echo 3/19 normal EF, normal wall motion  . History of pneumonia 2011  . History of prostatitis 1990s  . Hx of adenomatous colonic polyps 06/22/2016  . Hyperlipidemia   . Hypertension   . Migraines    prior history  . PTSD (post-traumatic stress disorder)     Past Surgical History:  Procedure Laterality Date  . CERVICAL DISCECTOMY    .  COLONOSCOPY  03/2017  . ELBOW SURGERY Left   . ELBOW SURGERY Right   . INGUINAL HERNIA REPAIR Bilateral    1996, 1997  . KNEE ARTHROSCOPY Right   . KNEE CARTILAGE SURGERY Left   . NASAL SEPTUM SURGERY    . STENT PLACEMENT VASCULAR ([place] [facility])  09/02/2010  . TONSILLECTOMY      Family Psychiatric History: Reviewed  Family History:  Family History  Problem Relation Age of Onset  . Arthritis Mother   . Heart disease Mother        s/p CABG  . Hypertension Mother   . Alzheimer's disease Mother   . Heart attack Mother   . Arthritis Father   . Hyperlipidemia Father   .  Heart disease Father        s/p CABG  . Stroke Father   . Hypertension Father   . Heart attack Father   . Multiple sclerosis Sister   . Diabetes Sister   . Lung cancer Paternal Grandmother   . Diabetes Sister   . Heart attack Sister   . Pulmonary embolism Sister        died  . Lung cancer Maternal Aunt   . Lung cancer Paternal Aunt   . Depression Brother        suicide    Social History:  Social History   Socioeconomic History  . Marital status: Widowed    Spouse name: Not on file  . Number of children: 2  . Years of education: 81  . Highest education level: Not on file  Occupational History  . Occupation: Retired  Scientific laboratory technician  . Financial resource strain: Not on file  . Food insecurity:    Worry: Not on file    Inability: Not on file  . Transportation needs:    Medical: Not on file    Non-medical: Not on file  Tobacco Use  . Smoking status: Never Smoker  . Smokeless tobacco: Never Used  Substance and Sexual Activity  . Alcohol use: No    Alcohol/week: 0.0 oz  . Drug use: No  . Sexual activity: Not Currently  Lifestyle  . Physical activity:    Days per week: Not on file    Minutes per session: Not on file  . Stress: Not on file  Relationships  . Social connections:    Talks on phone: Not on file    Gets together: Not on file    Attends religious service: Not on file    Active member of club or organization: Not on file    Attends meetings of clubs or organizations: Not on file    Relationship status: Not on file  Other Topics Concern  . Not on file  Social History Narrative   Retired, widowed in 2017    1 son / 1 daughter   2 caffeinated beverages daily no alcohol or tobacco   Fun: Work out in the yard.   Denies religious beliefs effecting healthcare.    Worked at [employer] for 30 years    Allergies:  Allergies  Allergen Reactions  . Ambien [Zolpidem Tartrate] Other (See Comments)    Blackout, memory issues  . Seroquel [Quetiapine]      SEVERE NIGHTMARES, SLEEPWALK AND NIGHT DRIVE WITH NO RECOLLECTION UPON WAKENING    Metabolic Disorder Labs: Lab Results  Component Value Date   HGBA1C 4.5 (L) 06/19/2017   MPG 108 06/14/2017   Lab Results  Component Value Date   PROLACTIN 26.0 (H) 06/14/2017  Lab Results  Component Value Date   CHOL 150 06/14/2017   TRIG 116 06/14/2017   HDL 61 06/14/2017   CHOLHDL 2.5 06/14/2017   VLDL 23 06/14/2017   LDLCALC 66 06/14/2017   LDLCALC 55 03/26/2015   Lab Results  Component Value Date   TSH 1.189 06/14/2017   TSH 0.59 03/26/2015    Therapeutic Level Labs: No results found for: LITHIUM No results found for: VALPROATE No components found for:  CBMZ  Current Medications: Current Outpatient Medications  Medication Sig Dispense Refill  . ALPRAZolam (XANAX) 0.5 MG tablet Take 1 tablet (0.5 mg total) by mouth 2 (two) times daily as needed for anxiety. 60 tablet 0  . ARIPiprazole (ABILIFY) 5 MG tablet Take 1 tablet (5 mg total) by mouth daily. 30 tablet 2  . clopidogrel (PLAVIX) 75 MG tablet Take 1 tablet (75 mg total) by mouth daily. 30 tablet 0  . diltiazem (CARTIA XT) 180 MG 24 hr capsule Take 1 capsule (180 mg total) by mouth daily. 30 capsule 0  . lamoTRIgine (LAMICTAL) 150 MG tablet Take 2 tablets (300 mg total) by mouth daily. For mood control 60 tablet 1  . nitroGLYCERIN (NITROSTAT) 0.4 MG SL tablet Place 0.4 mg under the tongue every 5 (five) minutes as needed for chest pain.     . rosuvastatin (CRESTOR) 20 MG tablet Take 1 tablet (20 mg total) by mouth daily. 30 tablet 11  . traZODone (DESYREL) 100 MG tablet Take 1 tablet (100 mg total) by mouth at bedtime as needed for sleep. 30 tablet 0   No current facility-administered medications for this visit.      Musculoskeletal: Strength & Muscle Tone: within normal limits Gait & Station: normal Patient leans: N/A  Psychiatric Specialty Exam: ROS  Blood pressure 137/82, pulse (!) 58, height _0  (1.778 m), weight  178 lb (80.7 kg), SpO2 98 %.Body mass index is 25.54 kg/m.  General Appearance: Casual  Eye Contact:  Good  Speech:  Clear and Coherent  Volume:  Normal  Mood:  Anxious  Affect:  Appropriate  Thought Process:  Goal Directed  Orientation:  Full (Time, Place, and Person)  Thought Content: Logical   Suicidal Thoughts:  No  Homicidal Thoughts:  No  Memory:  Immediate;   Good Recent;   Good Remote;   Good  Judgement:  Good  Insight:  Good  Psychomotor Activity:  Normal  Concentration:  Concentration: Fair and Attention Span: Fair  Recall:  Good  Fund of Knowledge: Good  Language: Good  Akathisia:  No  Handed:  Right  AIMS (if indicated): not done  Assets:  Communication Skills Desire for Improvement Housing Resilience  ADL's:  Intact  Cognition: WNL  Sleep:  Good   Screenings: AIMS     Admission (Discharged) from 06/12/2017 in [place] Total Score  0    AUDIT     Admission (Discharged) from 06/12/2017 in [place] [facility]  Alcohol Use Disorder Identification Test Final Score (AUDIT)  0    Mini-Mental     Office Visit from 03/26/2015 in [place]  Total Score (max 30 points )  30    PHQ2-9     Office Visit from 06/09/2016 in [place] from 01/13/2016 in [place] Office Visit from 03/26/2015 in [facility]  PHQ-2 Total Score  2  6  0  PHQ-9 Total  Score  8  26  -       Assessment and Plan: Posttraumatic stress disorder.  Bipolar disorder type I.  Anxiety disorder NOS.  Patient is a stable on his current psychiatric medication.  Continue Lamictal 300 mg daily, Abilify 5 mg daily.  He is taking Xanax 0.5 mg as needed and trazodone 100 mg at bedtime as needed for insomnia.  Encouraged to keep appointment with Janett Billow for CBT.  Recommended to call us back if is any question or any concern.   Follow-up in 3 months.   Kathlee Nations, MD 12/06/2017, 10:48 AM

## 2017-12-07 ENCOUNTER — Ambulatory Visit: Payer: Medicare Other | Admitting: Family Medicine

## 2017-12-07 ENCOUNTER — Ambulatory Visit: Payer: Self-pay | Admitting: *Deleted

## 2017-12-07 ENCOUNTER — Encounter: Payer: Self-pay | Admitting: Family Medicine

## 2017-12-07 ENCOUNTER — Ambulatory Visit (INDEPENDENT_AMBULATORY_CARE_PROVIDER_SITE_OTHER)
Admission: RE | Admit: 2017-12-07 | Discharge: 2017-12-07 | Disposition: A | Discharge status: Home / Self Care | Payer: Medicare Other | Source: Ambulatory Visit | Attending: Family Medicine | Admitting: Family Medicine

## 2017-12-07 VITALS — BP 118/72 | HR 63 | Temp 97.7°F | Ht 70.0 in | Wt 182.0 lb

## 2017-12-07 DIAGNOSIS — M542 Cervicalgia: Secondary | ICD-10-CM

## 2017-12-07 MED ORDER — TRAMADOL HCL 50 MG PO TABS: 50.0000 mg | ORAL_TABLET | Freq: Three times a day (TID) | ORAL | 0 refills | Status: DC | PRN

## 2017-12-07 NOTE — Progress Notes (Signed)
Justin [name] - 72 y.o. male MRN [id number]  Date of birth: [birth year]  SUBJECTIVE:  Including CC & ROS.  Chief Complaint  Patient presents with  . Neck Pain    Justin [name] is a 72 y.o. male that is here today for neck pain. He fell out of the bed when he was dreaming last night. His nose was bleeding. Admits to to swelling in his nose. His neck has been hurting, pain is mild to severe when rotating his neck side to side. Denies dizziness.  The pain is localized to his neck.  Denies any radicular symptoms.  Pain is moderate to severe in nature.  He has not tried any medications.  He is on a blood thinner.    Review of Systems  Constitutional: Negative for fever.  HENT: Negative for congestion.   Respiratory: Negative for cough.   Cardiovascular: Negative for chest pain.  Gastrointestinal: Negative for abdominal pain.  Musculoskeletal: Positive for neck pain.  Skin: Negative for color change.  Neurological: Negative for weakness.  Hematological: Negative for adenopathy.  Psychiatric/Behavioral: Negative for agitation.    HISTORY: Past Medical, Surgical, Social, and Family History Reviewed & Updated per EMR.   Pertinent Historical Findings include:  Past Medical History:  Diagnosis Date  . Anxiety and depression   . Arthritis    knees, c-spine // gets lumbar ESI q 6 mos  . Bipolar disorder ([place])   . CAD (coronary artery disease)    [place] 08/2009 >> [facility] ([facility]) - mLAD 70, Dx 65 >> PCI:  3x15 mm Endeavor DES to mLAD and 2.5x12 mm Endeavor DES to Dx // S/p NSTEMI >> [facility] 8/12 ([facility]) - LM ok, LAD prox and mid 40; LAD stent ok, Dx stent ok, mRCA 30, mLCx 50 >> med Rx // ETT-Echo 2/17 ([facility]): Normal, EF 55-60 at rest  . Chicken pox   . 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 Grief   . History of echocardiogram    Echo 3/19: Mild concentric LVH, EF 60-65, normal wall motion, grade 1 diastolic dysfunction, mild AI, mildly dilated aortic root (40 mm), MAC, mild LAE, atrial  septal lipomatous hypertrophy  . History of non-ST elevation myocardial infarction (NSTEMI)   . History of nuclear stress test    Nuclear stress test 3/19: EF 57, inferior/inferoseptal/inferolateral defect consistent with probable soft tissue attenuation (cannot exclude subendocardial scar), no ischemia, intermediate risk >> Echo 3/19 normal EF, normal wall motion  . History of pneumonia 2011  . History of prostatitis 1990s  . Hx of adenomatous colonic polyps 06/22/2016  . Hyperlipidemia   . Hypertension   . Migraines    prior history  . PTSD (post-traumatic stress disorder)     Past Surgical History:  Procedure Laterality Date  . CERVICAL DISCECTOMY    . COLONOSCOPY  03/2017  . ELBOW SURGERY Left   . ELBOW SURGERY Right   . INGUINAL HERNIA REPAIR Bilateral    1996, 1997  . KNEE ARTHROSCOPY Right   . KNEE CARTILAGE SURGERY Left   . NASAL SEPTUM SURGERY    . STENT PLACEMENT VASCULAR ([place] [facility])  09/02/2010  . TONSILLECTOMY      Allergies  Allergen Reactions  . Ambien [Zolpidem Tartrate] Other (See Comments)    Blackout, memory issues  . Seroquel [Quetiapine]     SEVERE NIGHTMARES, SLEEPWALK AND NIGHT DRIVE WITH NO RECOLLECTION UPON WAKENING    Family History  Problem Relation Age of Onset  . Arthritis Mother   .  Heart disease Mother        s/p CABG  . Hypertension Mother   . Alzheimer's disease Mother   . Heart attack Mother   . Arthritis Father   . Hyperlipidemia Father   . Heart disease Father        s/p CABG  . Stroke Father   . Hypertension Father   . Heart attack Father   . Multiple sclerosis Sister   . Diabetes Sister   . Lung cancer Paternal Grandmother   . Diabetes Sister   . Heart attack Sister   . Pulmonary embolism Sister        died  . Lung cancer Maternal Aunt   . Lung cancer Paternal Aunt   . Depression Brother        suicide     Social History   Socioeconomic History  . Marital status: Widowed    Spouse name: Not on file  . Number of  children: 2  . Years of education: 18  . Highest education level: Not on file  Occupational History  . Occupation: Retired  Scientific laboratory technician  . Financial resource strain: Not on file  . Food insecurity:    Worry: Not on file    Inability: Not on file  . Transportation needs:    Medical: Not on file    Non-medical: Not on file  Tobacco Use  . Smoking status: Never Smoker  . Smokeless tobacco: Never Used  Substance and Sexual Activity  . Alcohol use: No    Alcohol/week: 0.0 oz  . Drug use: No  . Sexual activity: Not Currently  Lifestyle  . Physical activity:    Days per week: Not on file    Minutes per session: Not on file  . Stress: Not on file  Relationships  . Social connections:    Talks on phone: Not on file    Gets together: Not on file    Attends religious service: Not on file    Active member of club or organization: Not on file    Attends meetings of clubs or organizations: Not on file    Relationship status: Not on file  . Intimate partner violence:    Fear of current or ex partner: Not on file    Emotionally abused: Not on file    Physically abused: Not on file    Forced sexual activity: Not on file  Other Topics Concern  . Not on file  Social History Narrative   Retired, widowed in 2017    1 son / 1 daughter   2 caffeinated beverages daily no alcohol or tobacco   Fun: Work out in the yard.   Denies religious beliefs effecting healthcare.    Worked at [employer] for 30 years     PHYSICAL EXAM:  VS: BP 118/72 (BP Location: Left Arm, Patient Position: Sitting, Cuff Size: Normal)   Pulse 63   Temp 97.7 F (36.5 C) (Oral)   Ht _0  (1.778 m)   Wt 182 lb (82.6 kg)   SpO2 96%   BMI 26.11 kg/m  Physical Exam Gen: NAD, alert, cooperative with exam, well-appearing ENT: normal lips, normal nasal mucosa,  Eye: normal EOM, normal conjunctiva and lids CV:  no edema, +2 pedal pulses   Resp: no accessory muscle use, non-labored,  Skin: no rashes, no areas of  induration  Neuro: normal tone, normal sensation to touch Psych:  normal insight, alert and oriented MSK:  Neck: Inspection unremarkable. Tenderness to palpation  over the trapezius on the left. No palpable stepoffs. Negative Spurling's maneuver. Pain exacerbated with lateral rotation, flexion, and extension. Grip strength and sensation normal in bilateral hands Strength good C4 to T1 distribution No sensory change to C4 to T1 Neurovascularly intact       ASSESSMENT & PLAN:   Neck pain Most likely muscular in nature.  No radicular symptoms.  Reports history of neck surgery. -X-ray today -Tramadol -If no improvement can consider trigger point injections versus physical therapy.

## 2017-12-07 NOTE — Telephone Encounter (Signed)
Pt called stating that during the night he fell out of the bed and hit his nose on the end table, and burst his lip; his neck is stiff and can not move it to the left or right; the pt says he 2 motrin for pain about 1100 this morning; the pt also has a heating pad on his neck but the heating pad has given some relief; nurse triage initiated and recommendations made per protocol to include seeing a physician within 4 hours; pt normally sees Dr Cathlean Cower but he has no availability within guidelines per protocol; pt offered and accepted appointment today at 1600 with Dr Clearance Coots, [facility]; he verbalizes understanding; will route to office for notification of this upcoming visit.     Reason for Disposition . [1] SEVERE neck pain (e.g., excruciating, unable to do any normal activities) AND [2] not improved after 2 hours of pain medicine  Answer Assessment - Initial Assessment Questions 1. ONSET: "When did the pain begin?"      12/06/17 at 1015 2. LOCATION: "Where does it hurt?"      Left side > right side 3. PATTERN "Does the pain come and go, or has it been constant since it started?"      constant 4. SEVERITY: "How bad is the pain?"  (Scale 1-10; or mild, moderate, severe)   - MILD (1-3): doesn't interfere with normal activities    - MODERATE (4-7): interferes with normal activities or awakens from sleep    - SEVERE (8-10):  excruciating pain, unable to do any normal activities      Moderate  5. RADIATION: "Does the pain go anywhere else, shoot into your arms?"     no 6. CORD SYMPTOMS: "Any weakness or numbness of the arms or legs?"     no 7. CAUSE: "What do you think is causing the neck pain?"     Fell last night 8. NECK OVERUSE: "Any recent activities that involved turning or twisting the neck?"     [address] out of bed last night 9. OTHER SYMPTOMS: "Do you have any other symptoms?" (e.g., headache, fever, chest pain, difficulty breathing, neck swelling)     Nose and upper lip swollen, 10.  PREGNANCY: "Is there any chance you are pregnant?" "When was your last menstrual period?"       n/a  Protocols used: NECK PAIN OR STIFFNESS-A-AH

## 2017-12-07 NOTE — Patient Instructions (Signed)
Please try to rub aspercream with lidocaine over the area  Please try to take tylenol for the pain  Please try ice and heat. Apply for 20 minutes at a time  We will call you with the results from today  Please follow up with me in 2-3 weeks if there is no improvement.

## 2017-12-08 DIAGNOSIS — M542 Cervicalgia: Secondary | ICD-10-CM | POA: Insufficient documentation

## 2017-12-08 NOTE — Assessment & Plan Note (Signed)
Most likely muscular in nature.  No radicular symptoms.  Reports history of neck surgery. -X-ray today -Tramadol -If no improvement can consider trigger point injections versus physical therapy.

## 2017-12-11 ENCOUNTER — Telehealth: Payer: Self-pay | Admitting: Internal Medicine

## 2017-12-11 NOTE — Telephone Encounter (Signed)
This was ordered by Dr Raeford Razor.

## 2017-12-11 NOTE — Telephone Encounter (Signed)
Copied from [place] [phone]. Topic: Quick Communication - See Telephone Encounter >> Dec 11, 2017  1:09 PM Vernona Rieger wrote: CRM for notification. See Telephone encounter for: 12/11/17.  Patient would like his xray results from 4/18.   Call back is [phone] (M)

## 2017-12-11 NOTE — Telephone Encounter (Signed)
Patient notified, verbalized understanding

## 2017-12-19 ENCOUNTER — Encounter (HOSPITAL_COMMUNITY): Payer: Self-pay | Admitting: Licensed Clinical Social Worker

## 2017-12-19 ENCOUNTER — Other Ambulatory Visit (HOSPITAL_COMMUNITY): Payer: Self-pay | Admitting: Psychiatry

## 2017-12-19 ENCOUNTER — Telehealth (HOSPITAL_COMMUNITY): Payer: Self-pay

## 2017-12-19 ENCOUNTER — Ambulatory Visit (INDEPENDENT_AMBULATORY_CARE_PROVIDER_SITE_OTHER): Payer: Medicare Other | Admitting: Licensed Clinical Social Worker

## 2017-12-19 DIAGNOSIS — F4321 Adjustment disorder with depressed mood: Secondary | ICD-10-CM | POA: Diagnosis not present

## 2017-12-19 DIAGNOSIS — F3162 Bipolar disorder, current episode mixed, moderate: Secondary | ICD-10-CM

## 2017-12-19 DIAGNOSIS — F4310 Post-traumatic stress disorder, unspecified: Secondary | ICD-10-CM

## 2017-12-19 NOTE — Progress Notes (Signed)
   THERAPIST PROGRESS NOTE  Session Time: 12:40pm-1:30pm  Participation Level: Active  Behavioral Response: Well GroomedAlertDepressed  Type of Therapy: Individual Therapy  Treatment Goals addressed: improve psychiatric symptoms, Controlled Behavior, Moderated Mood, Deliberative Speech (improved social functioning), Improve Unhelpful Thought Patterns, Emotional Regulation Skills (Moderate moods, anger management, stress management), Feel and express a full Range of Emotions, Learn about Diagnosis, Healthy Coping Skills, Recall the Traumatic event without being overwhelmed    Interventions: CBT, Motivational Interviewing, grounding techniques and mindfulness techniques, and psychoeducation  Summary: Justin [name] is a 72 y.o. male who presents with Bipolar 1 Disorder, Mixed, Moderate and Dissociative Amnesia, Grief, and PTSD  Suicidal/Homicidal: No - without intent/plan  Therapist Response:  Justin [name] met with Justin [name] for an individual session. Justin [name] discussed his psychiatric symptoms, his current life events and his homework. Justin [name] reports he has been having trouble sleeping and has been very depressed due to the recent birthdays of his wife and mother. Justin [name] reports he has been feeling guilty for having a good time because his wife is not there, he has also declined invitations to family functions due to depression. Justin [name] explored roots of depression and identified first experiences in teen years. Justin [name] discussed hx of loss and how those losses have impacted him, as well as his inability or unwillingness to let go.   Justin [name] discussed options for going out even when depressed in attempt to make him feel better. However, he was not willing to entertain that suggestion. Justin [name] explored what his wife would tell him to do in the event of being invited to a family function. Justin [name] reported his wife would have wanted him to go and represent her. However, Justin [name] was still not  willing to put himself out there at this time.   Plan: Return again in 2-3 weeks.  Diagnosis:     Axis I: Bipolar 1 Disorder, Mixed, Moderate and Dissociative Amnesia, Grief, and PTSD  [name], LCSW 12/19/2017

## 2017-12-19 NOTE — Telephone Encounter (Signed)
Patient came in because his Trazodone is not working, he is having bad dreams, night walking, he feels like it is stuffing him up. Patient was interested in the Nelson test, but he said he does not think he can take the trazodone anymore. Please review and advise, thank you

## 2017-12-19 NOTE — Telephone Encounter (Signed)
He can try doxepin 25 mg 1 to 2 capsule at bedtime.  We can also do Genesight testing.  Stop trazodone.

## 2017-12-19 NOTE — Progress Notes (Signed)
dox

## 2017-12-22 MED ORDER — DOXEPIN HCL 25 MG PO CAPS: ORAL_CAPSULE | ORAL | 0 refills | Status: DC

## 2017-12-22 NOTE — Telephone Encounter (Signed)
Okay, I called patient and let him know, he is agreeable to this and will call back with any questions or concerns

## 2018-01-02 ENCOUNTER — Ambulatory Visit (INDEPENDENT_AMBULATORY_CARE_PROVIDER_SITE_OTHER): Payer: Medicare Other | Admitting: Licensed Clinical Social Worker

## 2018-01-02 ENCOUNTER — Encounter (HOSPITAL_COMMUNITY): Payer: Self-pay | Admitting: Licensed Clinical Social Worker

## 2018-01-02 DIAGNOSIS — F4310 Post-traumatic stress disorder, unspecified: Secondary | ICD-10-CM | POA: Diagnosis not present

## 2018-01-02 DIAGNOSIS — Z634 Disappearance and death of family member: Secondary | ICD-10-CM | POA: Diagnosis not present

## 2018-01-02 DIAGNOSIS — F3162 Bipolar disorder, current episode mixed, moderate: Secondary | ICD-10-CM | POA: Diagnosis not present

## 2018-01-02 DIAGNOSIS — F440 Dissociative amnesia: Secondary | ICD-10-CM | POA: Diagnosis not present

## 2018-01-02 NOTE — Progress Notes (Signed)
   THERAPIST PROGRESS NOTE  Session Time: 12:30pm-1:30pm  Participation Level: Active  Behavioral Response: Well GroomedAlertDepressed  Type of Therapy: Individual Therapy  Treatment Goals addressed: improve psychiatric symptoms, Controlled Behavior, Moderated Mood, Deliberative Speech (improved social functioning), Improve Unhelpful Thought Patterns, Emotional Regulation Skills (Moderate moods, anger management, stress management), Feel and express a full Range of Emotions, Learn about Diagnosis, Healthy Coping Skills, Recall the Traumatic event without being overwhelmed    Interventions: CBT, Motivational Interviewing, grounding techniques and mindfulness techniques, and psychoeducation  Summary: Fern Asmar is a 72 y.o. male who presents with Bipolar 1 Disorder, Mixed, Moderate and Dissociative Amnesia, Grief, and PTSD  Suicidal/Homicidal: No - without intent/plan  Therapist Response:  Mikie met with clinician for an individual session. Benjaman discussed his psychiatric symptoms, his current life events and his homework. Jamell reports he has been more manic over the past week, with difficulty sleeping, irritability, racing thoughts, and a lot of frustration. Clinician explored thoughts and feelings using MI. Clinician discussed recent past where Kahleel reported getting some closure on his wife's death. However, clinician noted that with depression, isolation, and boredom, it is easy to revert back to depressed thoughts and emotional reasoning. Clinician discussed options of volunteering or getting a small part-time job in order to encouraged Hyder to get out of the house and increase socialization. Clinician also discussed the possibility of getting another dog.   Plan: Return again in 2-3 weeks.  Diagnosis:     Axis I: Bipolar 1 Disorder, Mixed, Moderate and Dissociative Amnesia, Grief, and PTSD  [name], LCSW 01/02/2018

## 2018-01-13 ENCOUNTER — Encounter (HOSPITAL_COMMUNITY): Payer: Self-pay | Admitting: Emergency Medicine

## 2018-01-13 ENCOUNTER — Other Ambulatory Visit: Payer: Self-pay

## 2018-01-13 ENCOUNTER — Emergency Department (HOSPITAL_COMMUNITY)
Admission: EM | Admit: 2018-01-13 | Discharge: 2018-01-13 | Disposition: A | Discharge status: Home / Self Care | Payer: Medicare Other | Attending: Emergency Medicine | Admitting: Emergency Medicine

## 2018-01-13 DIAGNOSIS — Y9289 Other specified places as the place of occurrence of the external cause: Secondary | ICD-10-CM | POA: Diagnosis not present

## 2018-01-13 DIAGNOSIS — Y9389 Activity, other specified: Secondary | ICD-10-CM | POA: Insufficient documentation

## 2018-01-13 DIAGNOSIS — S61011A Laceration without foreign body of right thumb without damage to nail, initial encounter: Secondary | ICD-10-CM | POA: Insufficient documentation

## 2018-01-13 DIAGNOSIS — Z5321 Procedure and treatment not carried out due to patient leaving prior to being seen by health care provider: Secondary | ICD-10-CM | POA: Diagnosis not present

## 2018-01-13 DIAGNOSIS — W458XXA Other foreign body or object entering through skin, initial encounter: Secondary | ICD-10-CM | POA: Insufficient documentation

## 2018-01-13 DIAGNOSIS — Y999 Unspecified external cause status: Secondary | ICD-10-CM | POA: Diagnosis not present

## 2018-01-13 NOTE — ED Triage Notes (Signed)
Pt states he cut his right thumb on a barb wire fence last night around 8pm  Pt states he is on a blood thinner and could not get it to stop bleeding  Pt states his thumb is throbbing  Unknown when he had his last tetanus shot

## 2018-01-17 ENCOUNTER — Other Ambulatory Visit (HOSPITAL_COMMUNITY): Payer: Self-pay

## 2018-01-17 DIAGNOSIS — F411 Generalized anxiety disorder: Secondary | ICD-10-CM

## 2018-01-17 MED ORDER — ALPRAZOLAM 0.5 MG PO TABS: 0.5000 mg | ORAL_TABLET | Freq: Two times a day (BID) | ORAL | 0 refills | Status: DC | PRN

## 2018-01-29 ENCOUNTER — Ambulatory Visit (HOSPITAL_COMMUNITY): Payer: Medicare Other | Admitting: Licensed Clinical Social Worker

## 2018-01-29 ENCOUNTER — Encounter (HOSPITAL_COMMUNITY): Payer: Self-pay | Admitting: Licensed Clinical Social Worker

## 2018-01-29 DIAGNOSIS — F4310 Post-traumatic stress disorder, unspecified: Secondary | ICD-10-CM | POA: Diagnosis not present

## 2018-01-29 DIAGNOSIS — F3162 Bipolar disorder, current episode mixed, moderate: Secondary | ICD-10-CM | POA: Diagnosis not present

## 2018-01-29 NOTE — Progress Notes (Signed)
   THERAPIST PROGRESS NOTE  Session Time: 1:30pm-2:30pm  Participation Level: Active  Behavioral Response: Well GroomedAlertEuthymic  Type of Therapy: Individual Therapy  Treatment Goals addressed: improve psychiatric symptoms, Controlled Behavior, Moderated Mood, Deliberative Speech (improved social functioning), Improve Unhelpful Thought Patterns, Emotional Regulation Skills (Moderate moods, anger management, stress management), Feel and express a full Range of Emotions, Learn about Diagnosis, Healthy Coping Skills, Recall the Traumatic event without being overwhelmed    Interventions: CBT, Motivational Interviewing, grounding techniques and mindfulness techniques, and psychoeducation  Summary: Justin [name] is a 72 y.o. male who presents with Bipolar 1 Disorder, Mixed, Moderate and PTSD  Suicidal/Homicidal: No - without intent/plan  Therapist Response:  Justin [name] met with clinician for an individual session. Justin [name] discussed his psychiatric symptoms, his current life events and his homework. Justin [name] reports he has been doing pretty well since last session. He reports he has been spending as much time outside as possible, working on the garden and being active. He reports this has been helpful. Justin [name] also discussed interactions with his son and noted some improvement, although still some hesitation about offending son. Justin [name] reported that he cared for son's dog over the Memorial Day weekend, which was really enjoyable and made him start thinking about getting another dog. Clinician normalized this experience and encouraged him to be more open minded about having another pet. Justin [name] reported concerns about his interactions with son's in-laws. Clinician discussed the differences in value systems and their concerns about getting attention from the grandchildren when he is around. Clinician encouraged Justin [name] to not worry about their jealousy, as he cannot do anything to control it or change  it.    Plan: Return again in 1 -2 weeks.  Diagnosis:     Axis I: Bipolar 1 Disorder, Mixed, Moderate and PTSD  [name] 01/29/2018

## 2018-02-13 ENCOUNTER — Telehealth (HOSPITAL_COMMUNITY): Payer: Self-pay

## 2018-02-13 ENCOUNTER — Other Ambulatory Visit (HOSPITAL_COMMUNITY): Payer: Self-pay

## 2018-02-13 ENCOUNTER — Other Ambulatory Visit (HOSPITAL_COMMUNITY): Payer: Self-pay | Admitting: Psychiatry

## 2018-02-13 ENCOUNTER — Encounter (HOSPITAL_COMMUNITY): Payer: Self-pay | Admitting: Licensed Clinical Social Worker

## 2018-02-13 ENCOUNTER — Ambulatory Visit (INDEPENDENT_AMBULATORY_CARE_PROVIDER_SITE_OTHER): Payer: Medicare Other | Admitting: Licensed Clinical Social Worker

## 2018-02-13 DIAGNOSIS — F411 Generalized anxiety disorder: Secondary | ICD-10-CM

## 2018-02-13 DIAGNOSIS — F4310 Post-traumatic stress disorder, unspecified: Secondary | ICD-10-CM

## 2018-02-13 DIAGNOSIS — F3162 Bipolar disorder, current episode mixed, moderate: Secondary | ICD-10-CM | POA: Diagnosis not present

## 2018-02-13 MED ORDER — ALPRAZOLAM 0.5 MG PO TABS: 0.5000 mg | ORAL_TABLET | Freq: Two times a day (BID) | ORAL | 0 refills | Status: DC | PRN

## 2018-02-13 NOTE — Telephone Encounter (Signed)
Patient is calling for a refill on his Xanax, he is coming this afternoon to see Evelena Peat. He would like to pick it up instead of me calling it in. Please review and advise, he sees you again in July

## 2018-02-13 NOTE — Progress Notes (Signed)
   THERAPIST PROGRESS NOTE  Session Time: 12:30pm-1:30pm  Participation Level: Active  Behavioral Response: Well GroomedAlertDepressed  Type of Therapy: Individual Therapy  Treatment Goals addressed: improve psychiatric symptoms, Controlled Behavior, Moderated Mood, Deliberative Speech (improved social functioning), Improve Unhelpful Thought Patterns, Emotional Regulation Skills (Moderate moods, anger management, stress management), Feel and express a full Range of Emotions, Learn about Diagnosis, Healthy Coping Skills, Recall the Traumatic event without being overwhelmed    Interventions: CBT, Motivational Interviewing, grounding techniques and mindfulness techniques, and psychoeducation  Summary: Justin [name] is a 72 y.o. male who presents with Bipolar 1 Disorder, Mixed, Moderate and Dissociative Amnesia, Grief, and PTSD  Suicidal/Homicidal: No - without intent/plan  Therapist Response:  Justin [name] met with clinician for an individual session. Justin [name] discussed his psychiatric symptoms, his current life events and his homework. Justin [name] identified ongoing depressive sxs over the past week due to continuing instability in his relationship with son. Clinician explored recent strained interactions and discussed his stand on opening up the lines of communication. Clinician also utilized reality testing and offered alternative reasons for his son not calling this week. Justin [name] identified being an introvert and struggling with social interactions with new people. Clinician discussed possibility of volunteering somewhere or finding a part time job in order to make friends and have somewhere to go during the day. Clinician discussed the value of altruism in self care, as well as treatment for depression. Justin [name] identified possibility of volunteering at [employer].   Plan: Return again in 1 -2 weeks.  Diagnosis:     Axis I: Bipolar 1 Disorder, Mixed, Moderate and Dissociative Amnesia, Grief, and  PTSD  [name], LCSW 02/13/2018

## 2018-02-27 ENCOUNTER — Ambulatory Visit (HOSPITAL_COMMUNITY): Payer: Medicare Other | Admitting: Licensed Clinical Social Worker

## 2018-02-27 ENCOUNTER — Encounter (HOSPITAL_COMMUNITY): Payer: Self-pay | Admitting: Licensed Clinical Social Worker

## 2018-02-27 DIAGNOSIS — F4321 Adjustment disorder with depressed mood: Secondary | ICD-10-CM

## 2018-02-27 DIAGNOSIS — F4310 Post-traumatic stress disorder, unspecified: Secondary | ICD-10-CM | POA: Diagnosis not present

## 2018-02-27 DIAGNOSIS — F3162 Bipolar disorder, current episode mixed, moderate: Secondary | ICD-10-CM

## 2018-02-27 NOTE — Progress Notes (Signed)
   THERAPIST PROGRESS NOTE  Session Time: 12:40pm-1:30pm  Participation Level: Active  Behavioral Response: Well GroomedAlertAnxious and Depressed  Type of Therapy: Individual Therapy  Treatment Goals addressed: improve psychiatric symptoms, Controlled Behavior, Moderated Mood, Deliberative Speech (improved social functioning), Improve Unhelpful Thought Patterns, Emotional Regulation Skills (Moderate moods, anger management, stress management), Feel and express a full Range of Emotions, Learn about Diagnosis, Healthy Coping Skills, Recall the Traumatic event without being overwhelmed    Interventions: CBT, Motivational Interviewing, grounding techniques and mindfulness techniques, and psychoeducation  Summary: Justin [name] is a 72 y.o. male who presents with Bipolar 1 Disorder, Mixed, Moderate and Dissociative Amnesia, Grief, and PTSD  Suicidal/Homicidal: No - without intent/plan  Therapist Response:  Justin [name] met with clinician for an individual session. Justin [name] discussed his psychiatric symptoms, his current life events and his homework. Friend reports he continues to feel depressed, sleepless, and is starting to have more racing memories about losses. Clinician processed thoughts and feelings using MI. Clinician discussed the importance of being able to open up and let go of past trauma. Justin [name] processed losses in his life, including his sister, his brother, and friends. Justin [name] also was able to identify changes in himself and his processing over the past year of counseling.   Plan: Return again in 2-3 weeks.  Diagnosis:     Axis I: Bipolar 1 Disorder, Mixed, Moderate and Dissociative Amnesia, Grief, and PTSD  [name], LCSW 02/27/2018

## 2018-03-07 ENCOUNTER — Ambulatory Visit (HOSPITAL_COMMUNITY): Payer: Medicare Other | Admitting: Psychiatry

## 2018-03-12 ENCOUNTER — Ambulatory Visit (HOSPITAL_COMMUNITY): Payer: Medicare Other | Admitting: Licensed Clinical Social Worker

## 2018-03-14 ENCOUNTER — Other Ambulatory Visit (HOSPITAL_COMMUNITY): Payer: Self-pay

## 2018-03-14 DIAGNOSIS — F411 Generalized anxiety disorder: Secondary | ICD-10-CM

## 2018-03-14 MED ORDER — ALPRAZOLAM 0.5 MG PO TABS: 0.5000 mg | ORAL_TABLET | Freq: Two times a day (BID) | ORAL | 0 refills | Status: DC | PRN

## 2018-03-29 ENCOUNTER — Encounter (HOSPITAL_COMMUNITY): Payer: Self-pay | Admitting: Licensed Clinical Social Worker

## 2018-03-29 ENCOUNTER — Ambulatory Visit (HOSPITAL_COMMUNITY): Payer: Medicare Other | Admitting: Licensed Clinical Social Worker

## 2018-03-29 DIAGNOSIS — F4310 Post-traumatic stress disorder, unspecified: Secondary | ICD-10-CM | POA: Diagnosis not present

## 2018-03-29 DIAGNOSIS — F3162 Bipolar disorder, current episode mixed, moderate: Secondary | ICD-10-CM | POA: Diagnosis not present

## 2018-03-29 NOTE — Progress Notes (Signed)
   THERAPIST PROGRESS NOTE  Session Time: 1:30pm-2:30pm  Participation Level: Active  Behavioral Response: Well GroomedAlertDepressed  Type of Therapy: Individual Therapy  Treatment Goals addressed: improve psychiatric symptoms, Controlled Behavior, Moderated Mood, Deliberative Speech (improved social functioning), Improve Unhelpful Thought Patterns, Emotional Regulation Skills (Moderate moods, anger management, stress management), Feel and express a full Range of Emotions, Learn about Diagnosis, Healthy Coping Skills, Recall the Traumatic event without being overwhelmed    Interventions: CBT, Motivational Interviewing, grounding techniques and mindfulness techniques, and psychoeducation  Summary: Bowyn Mercier is a 72 y.o. male who presents with Bipolar 1 Disorder, Mixed, Moderate and PTSD  Suicidal/Homicidal: No - without intent/plan  Therapist Response:  Jeriel met with clinician for an individual session. Rutilio discussed his psychiatric symptoms, his current life events and his homework. Christyan identified increased depression over the past few weeks, including some thoughts of self injury. However, he reports this has subsided somewhat. Clinician explored triggers and utilized MI OARS to reflect and summarize thoughts and feelings. Bengie identified hx of trauma and loss, which he has "built up a wall" inside of himself and not allowed himself to feel any of the pain. Clinician explored the hx of this and noted that there may be 60 years of trauma and grief that has been clogged up and now that there is some quiet time, those feelings are rising up. Clinician encouraged self care, as well as encouraged Barnet to allow himself to cry. Dylann processed relationship with son and identified "walking on eggshells" with his son in order to preserve the relationship.   Plan: Return again in 2-3 weeks.  Diagnosis:     Axis I: Bipolar 1 Disorder, Mixed, Moderate and PTSD  [name], LCSW 03/29/2018

## 2018-04-10 ENCOUNTER — Other Ambulatory Visit (HOSPITAL_COMMUNITY): Payer: Self-pay

## 2018-04-10 DIAGNOSIS — F411 Generalized anxiety disorder: Secondary | ICD-10-CM

## 2018-04-10 MED ORDER — ALPRAZOLAM 0.5 MG PO TABS: 0.5000 mg | ORAL_TABLET | Freq: Two times a day (BID) | ORAL | 0 refills | Status: DC | PRN

## 2018-04-12 ENCOUNTER — Ambulatory Visit (HOSPITAL_COMMUNITY): Payer: Medicare Other | Admitting: Licensed Clinical Social Worker

## 2018-04-12 ENCOUNTER — Encounter (HOSPITAL_COMMUNITY): Payer: Self-pay | Admitting: Licensed Clinical Social Worker

## 2018-04-12 DIAGNOSIS — F3162 Bipolar disorder, current episode mixed, moderate: Secondary | ICD-10-CM | POA: Diagnosis not present

## 2018-04-12 DIAGNOSIS — F4310 Post-traumatic stress disorder, unspecified: Secondary | ICD-10-CM | POA: Diagnosis not present

## 2018-04-12 DIAGNOSIS — F440 Dissociative amnesia: Secondary | ICD-10-CM

## 2018-04-12 DIAGNOSIS — F4321 Adjustment disorder with depressed mood: Secondary | ICD-10-CM | POA: Diagnosis not present

## 2018-04-12 NOTE — Progress Notes (Signed)
   THERAPIST PROGRESS NOTE  Session Time: 12:30pm-1:30pm  Participation Level: Active  Behavioral Response: Well GroomedAlertEuthymic   Type of Therapy: Individual Therapy  Treatment Goals addressed: improve psychiatric symptoms, Controlled Behavior, Moderated Mood, Deliberative Speech (improved social functioning), Improve Unhelpful Thought Patterns, Emotional Regulation Skills (Moderate moods, anger management, stress management), Feel and express a full Range of Emotions, Learn about Diagnosis, Healthy Coping Skills, Recall the Traumatic event without being overwhelmed    Interventions: CBT, Motivational Interviewing, grounding techniques and mindfulness techniques, and psychoeducation  Summary: Justin [name] is a 72 y.o. male who presents with Bipolar 1 Disorder, Mixed, Moderate and Dissociative Amnesia, Grief, and PTSD  Suicidal/Homicidal: No - without intent/plan  Therapist Response:  Justin [name] met with clinician for an individual session. Justin [name] discussed his psychiatric symptoms, his current life events and his homework. Justin [name] reported that he has not been able to cry, as was prescribed at last session. However, he has been attempting to focus on the positive more and trying to think about happy times with his wife, children, parents, siblings, etc. He reports this has been helpful at times. Clinician processed ways to allow grief to be present and accept that it will never fully disappear without getting stuck in deep depression. Clinician offered support and suggestions about ways to redirect thoughts and to have more realistic expectations. Clinician processed relationship with son and noted that while it hurts Justin [name] when son is not making an effort, it is important to continue trying in order to be in the lives of his grandchildren.   Plan: Return again in 1 -2 weeks.  Diagnosis:     Axis I: Bipolar 1 Disorder, Mixed, Moderate and Dissociative Amnesia, Grief, and  PTSD  [name], LCSW 04/12/2018

## 2018-04-18 NOTE — Progress Notes (Signed)
Justin [name] - 72 y.o. male MRN [id number]  Date of birth: 1946/02/22  SUBJECTIVE:  Including CC & ROS.  Chief Complaint  Patient presents with  . Back Pain    Justin [name] is a 72 y.o. male that is presenting with low back pain. Pain has been ongoing for one week. He noticed the pain after he lifted a couple of cases of water. Denies tingling and numbness. Pain is mild to severe standing and sitting. He has been using a heating pad. Denies previous surgeries.  Pain is occurring on the right flank.  Denies any radicular symptoms.  Pain is unchanged from the previous week.  Denies history of similar pain.    Review of Systems  Constitutional: Negative for fever.  HENT: Negative for congestion.   Respiratory: Negative for cough.   Cardiovascular: Negative for chest pain.  Gastrointestinal: Negative for abdominal pain.  Musculoskeletal: Positive for back pain.  Skin: Negative for color change.  Neurological: Negative for weakness.  Hematological: Negative for adenopathy.  Psychiatric/Behavioral: Negative for agitation.    HISTORY: Past Medical, Surgical, Social, and Family History Reviewed & Updated per EMR.   Pertinent Historical Findings include:  Past Medical History:  Diagnosis Date  . Anxiety and depression   . Arthritis    knees, c-spine // gets lumbar ESI q 6 mos  . Bipolar disorder ([place])   . CAD (coronary artery disease)    [place] 08/2009 >> LHC ([facility]) - mLAD 70, Dx 65 >> PCI:  3x15 mm Endeavor DES to mLAD and 2.5x12 mm Endeavor DES to Dx // S/p NSTEMI >> LHC 8/12 ([facility]) - LM ok, LAD prox and mid 40; LAD stent ok, Dx stent ok, mRCA 30, mLCx 50 >> med Rx // ETT-Echo 2/17 ([facility]): Normal, EF 55-60 at rest  . Chicken pox   . 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 Grief   . History of echocardiogram    Echo 3/19: Mild concentric LVH, EF 60-65, normal wall motion, grade 1 diastolic dysfunction, mild AI, mildly dilated aortic root (40 mm), MAC, mild LAE, atrial  septal lipomatous hypertrophy  . History of non-ST elevation myocardial infarction (NSTEMI)   . History of nuclear stress test    Nuclear stress test 3/19: EF 57, inferior/inferoseptal/inferolateral defect consistent with probable soft tissue attenuation (cannot exclude subendocardial scar), no ischemia, intermediate risk >> Echo 3/19 normal EF, normal wall motion  . History of pneumonia 2011  . History of prostatitis 1990s  . Hx of adenomatous colonic polyps 06/22/2016  . Hyperlipidemia   . Hypertension   . Migraines    prior history  . PTSD (post-traumatic stress disorder)     Past Surgical History:  Procedure Laterality Date  . CERVICAL DISCECTOMY    . COLONOSCOPY  03/2017  . ELBOW SURGERY Left   . ELBOW SURGERY Right   . INGUINAL HERNIA REPAIR Bilateral    1996, 1997  . KNEE ARTHROSCOPY Right   . KNEE CARTILAGE SURGERY Left   . NASAL SEPTUM SURGERY    . STENT PLACEMENT VASCULAR ([facility])  09/02/2010  . TONSILLECTOMY      Allergies  Allergen Reactions  . Ambien [Zolpidem Tartrate] Other (See Comments)    Blackout, memory issues  . Seroquel [Quetiapine]     SEVERE NIGHTMARES, SLEEPWALK AND NIGHT DRIVE WITH NO RECOLLECTION UPON WAKENING    Family History  Problem Relation Age of Onset  . Arthritis Mother   . Heart disease Mother  s/p CABG  . Hypertension Mother   . Alzheimer's disease Mother   . Heart attack Mother   . Arthritis Father   . Hyperlipidemia Father   . Heart disease Father        s/p CABG  . Stroke Father   . Hypertension Father   . Heart attack Father   . Multiple sclerosis Sister   . Diabetes Sister   . Lung cancer Paternal Grandmother   . Diabetes Sister   . Heart attack Sister   . Pulmonary embolism Sister        died  . Lung cancer Maternal Aunt   . Lung cancer Paternal Aunt   . Depression Brother        suicide     Social History   Socioeconomic History  . Marital status: Widowed    Spouse name: Not on file  . Number of  children: 2  . Years of education: 74  . Highest education level: Not on file  Occupational History  . Occupation: Retired  Scientific laboratory technician  . Financial resource strain: Not on file  . Food insecurity:    Worry: Not on file    Inability: Not on file  . Transportation needs:    Medical: Not on file    Non-medical: Not on file  Tobacco Use  . Smoking status: Never Smoker  . Smokeless tobacco: Never Used  Substance and Sexual Activity  . Alcohol use: No    Alcohol/week: 0.0 standard drinks  . Drug use: No  . Sexual activity: Not Currently  Lifestyle  . Physical activity:    Days per week: Not on file    Minutes per session: Not on file  . Stress: Not on file  Relationships  . Social connections:    Talks on phone: Not on file    Gets together: Not on file    Attends religious service: Not on file    Active member of club or organization: Not on file    Attends meetings of clubs or organizations: Not on file    Relationship status: Not on file  . Intimate partner violence:    Fear of current or ex partner: Not on file    Emotionally abused: Not on file    Physically abused: Not on file    Forced sexual activity: Not on file  Other Topics Concern  . Not on file  Social History Narrative   Retired, widowed in 2017    1 son / 1 daughter   2 caffeinated beverages daily no alcohol or tobacco   Fun: Work out in the yard.   Denies religious beliefs effecting healthcare.    Worked at [employer] for 30 years     PHYSICAL EXAM:  VS: BP (!) 148/96 (BP Location: Left Arm, Patient Position: Sitting, Cuff Size: Normal)   Pulse 96   Ht 5' 10"  (1.778 m)   Wt 181 lb (82.1 kg)   SpO2 98%   BMI 25.97 kg/m  Physical Exam Gen: NAD, alert, cooperative with exam, well-appearing ENT: normal lips, normal nasal mucosa,  Eye: normal EOM, normal conjunctiva and lids CV:  no edema, +2 pedal pulses   Resp: no accessory muscle use, non-labored,   Skin: no rashes, no areas of induration    Neuro: normal tone, normal sensation to touch Psych:  normal insight, alert and oriented MSK:  Back:  TTP of the right lower back  No step off of the posterior ribs  No midline lumbar  spine tenderness.  No ttp of the SI joint or GT  Normal IR and ER of the hips Normal strength to resistance with hip flexion, knee flexion and extension and plantarflexion and dorsal flexion  Negative SLR b/l  Neurovascularly intact       ASSESSMENT & PLAN:   Low back pain Acute in nature. Feels it occurred after lifting. Likely muscular. Not suggestive of a rib fracture or compression fracture  - mobic and flexeril  - counseled on supportive care - if no improvement consider trigger point injections vs imaging.

## 2018-04-19 ENCOUNTER — Encounter: Payer: Self-pay | Admitting: Family Medicine

## 2018-04-19 ENCOUNTER — Ambulatory Visit: Payer: Medicare Other | Admitting: Family Medicine

## 2018-04-19 DIAGNOSIS — M545 Low back pain, unspecified: Secondary | ICD-10-CM

## 2018-04-19 MED ORDER — NAPROXEN 500 MG PO TABS: 500.0000 mg | ORAL_TABLET | Freq: Two times a day (BID) | ORAL | 1 refills | Status: DC

## 2018-04-19 MED ORDER — BACLOFEN 10 MG PO TABS: 10.0000 mg | ORAL_TABLET | Freq: Two times a day (BID) | ORAL | 0 refills | Status: DC

## 2018-04-19 NOTE — Assessment & Plan Note (Signed)
Acute in nature. Feels it occurred after lifting. Likely muscular. Not suggestive of a rib fracture or compression fracture  - mobic and flexeril  - counseled on supportive care - if no improvement consider trigger point injections vs imaging.

## 2018-04-19 NOTE — Patient Instructions (Signed)
Good to see you  Please try the naproxen for 10 days straight and then as needed  The muscle relaxer may make you sleepy  Please try heat on the area  Please try to massage the area.  Please follow up with me in 1-2 weeks if the pain isn't improving.

## 2018-05-01 ENCOUNTER — Encounter: Payer: Self-pay | Admitting: Physician Assistant

## 2018-05-01 ENCOUNTER — Ambulatory Visit (INDEPENDENT_AMBULATORY_CARE_PROVIDER_SITE_OTHER): Payer: Medicare Other | Admitting: Physician Assistant

## 2018-05-01 VITALS — BP 130/86 | HR 71 | Ht 70.0 in | Wt 178.8 lb

## 2018-05-01 DIAGNOSIS — E785 Hyperlipidemia, unspecified: Secondary | ICD-10-CM | POA: Diagnosis not present

## 2018-05-01 DIAGNOSIS — I10 Essential (primary) hypertension: Secondary | ICD-10-CM | POA: Diagnosis not present

## 2018-05-01 DIAGNOSIS — I25119 Atherosclerotic heart disease of native coronary artery with unspecified angina pectoris: Secondary | ICD-10-CM

## 2018-05-01 MED ORDER — NITROGLYCERIN 0.4 MG SL SUBL: 0.4000 mg | SUBLINGUAL_TABLET | SUBLINGUAL | 11 refills | Status: DC | PRN

## 2018-05-01 NOTE — Progress Notes (Signed)
Cardiology Office Note:    Date:  05/01/2018   ID:  Justin [name], DOB 04-15-1946, MRN [id number]  PCP:  Biagio Borg, MD  Cardiologist:  Juluis Rainier with MD on Care Team / Richardson Dopp, PA-C    Referring MD: Biagio Borg, MD   Chief Complaint  Patient presents with  . Follow-up    CAD    History of Present Illness:    HENERY [name] is a 72 y.o. male with coronary artery disease, hypertension, hyperlipidemia.  He underwent PCI with DES to the LAD and Dx in 2011 due to unstable angina and suffered a NSTEMI in 03/2011 with no culprit on catheterization (LAD and Dx stents were patent and there was mild to mod non-obstructive disease elsewhere).  Therefore, he was treated medically.  A stress echo in 2017 was normal.  He has a history of fairly stable anginal symptoms since his stenting.  These symptoms typically occur with emotional stress as well as with exertion.  He established with our practice in March 2019.  A follow up echo demonstrated normal LV function, mild diastolic dysfunction, normal wall motion.  A follow up Myoview demonstrated no ischemia.  There was a medium inferior defect that was likely diaphragmatic attenuation.    Mr. Justin [name] returns for follow-up.  Since last seen, he has done well.  He has had a couple of episodes of chest discomfort, for which she is taking nitroglycerin.  This generally provides relief.  He does note that he has had less symptoms of angina when compared to his symptoms in the past.  He has not had significant shortness of breath.  He has not had any paroxysmal nocturnal dyspnea, lower extremity swelling or syncope.  Prior CV studies:   The following studies were reviewed today:  Echo 11/17/17 Mild conc LVH, EF 60-65, no RWMA, Gr 1 DD, mildly calcified AV leaflets, mild AI, mildly dilated aortic root (40 mm), MAC, trivial MR, mild LAE, mild RV dilation, atrial septal lipomatous hypertrophy  Nuclear stress test 11/17/17 EF 57, inf/inf-sept/inf-lat  defect c/w probable soft tissue atten, cannot exclude subendocardial scar, no ischemia; Intermediate Risk   Past Medical History:  Diagnosis Date  . Anxiety and depression   . Arthritis    knees, c-spine // gets lumbar ESI q 6 mos  . Bipolar disorder ([place])   . CAD (coronary artery disease)    [place] 08/2009 >> LHC ([facility]) - mLAD 70, Dx 65 >> PCI:  3x15 mm Endeavor DES to mLAD and 2.5x12 mm Endeavor DES to Dx // S/p NSTEMI >> LHC 8/12 ([facility]) - LM ok, LAD prox and mid 40; LAD stent ok, Dx stent ok, mRCA 30, mLCx 50 >> med Rx // ETT-Echo 2/17 ([facility]): Normal, EF 55-60 at rest  . Chicken pox   . 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 Grief   . History of echocardiogram    Echo 3/19: Mild concentric LVH, EF 60-65, normal wall motion, grade 1 diastolic dysfunction, mild AI, mildly dilated aortic root (40 mm), MAC, mild LAE, atrial septal lipomatous hypertrophy  . History of non-ST elevation myocardial infarction (NSTEMI)   . History of nuclear stress test    Nuclear stress test 3/19: EF 57, inferior/inferoseptal/inferolateral defect consistent with probable soft tissue attenuation (cannot exclude subendocardial scar), no ischemia, intermediate risk >> Echo 3/19 normal EF, normal wall motion  . History of pneumonia 2011  . History of prostatitis 1990s  . Hx of adenomatous colonic polyps  06/22/2016  . Hyperlipidemia   . Hypertension   . Migraines    prior history  . PTSD (post-traumatic stress disorder)    Surgical Hx: The patient  has a past surgical history that includes Nasal septum surgery; Elbow surgery (Left); Inguinal hernia repair (Bilateral); Tonsillectomy; Knee arthroscopy (Right); Knee cartilage surgery (Left); Elbow surgery (Right); Cervical discectomy; STENT PLACEMENT VASCULAR ([facility]) (09/02/2010); and Colonoscopy (03/2017).   Current Medications: Current Meds  Medication Sig  . ALPRAZolam (XANAX) 0.5 MG tablet Take 1 tablet (0.5 mg total) by mouth 2 (two) times daily  as needed for anxiety.  . ARIPiprazole (ABILIFY) 5 MG tablet Take 1 tablet (5 mg total) by mouth daily.  . clopidogrel (PLAVIX) 75 MG tablet Take 1 tablet (75 mg total) by mouth daily.  Marland Kitchen diltiazem (CARTIA XT) 180 MG 24 hr capsule Take 1 capsule (180 mg total) by mouth daily.  Marland Kitchen doxepin (SINEQUAN) 25 MG capsule Take 25 mg by mouth at bedtime as needed.  . lamoTRIgine (LAMICTAL) 150 MG tablet Take 2 tablets (300 mg total) by mouth daily. For mood control  . nitroGLYCERIN (NITROSTAT) 0.4 MG SL tablet Place 1 tablet (0.4 mg total) under the tongue every 5 (five) minutes as needed for chest pain.  . rosuvastatin (CRESTOR) 20 MG tablet Take 1 tablet (20 mg total) by mouth daily.  . [DISCONTINUED] nitroGLYCERIN (NITROSTAT) 0.4 MG SL tablet Place 0.4 mg under the tongue every 5 (five) minutes as needed for chest pain.      Allergies:   Ambien [zolpidem tartrate] and Seroquel [quetiapine]   Social History   Tobacco Use  . Smoking status: Never Smoker  . Smokeless tobacco: Never Used  Substance Use Topics  . Alcohol use: No    Alcohol/week: 0.0 standard drinks  . Drug use: No     Family Hx: The patient's family history includes Alzheimer's disease in his mother; Arthritis in his father and mother; Depression in his brother; Diabetes in his sister and sister; Heart attack in his father, mother, and sister; Heart disease in his father and mother; Hyperlipidemia in his father; Hypertension in his father and mother; Lung cancer in his maternal aunt, paternal aunt, and paternal grandmother; Multiple sclerosis in his sister; Pulmonary embolism in his sister; Stroke in his father.  ROS:   Please see the history of present illness.    Review of Systems  Constitution: Positive for decreased appetite and weight loss.  Eyes: Positive for visual disturbance.  Cardiovascular: Positive for chest pain.  Musculoskeletal: Positive for back pain.  Psychiatric/Behavioral: Positive for depression. The patient  is nervous/anxious.    All other systems reviewed and are negative.   EKGs/Labs/Other Test Reviewed:    EKG:  EKG is  ordered today.  The ekg ordered today demonstrates normal sinus rhythm, heart rate 71, left axis deviation, QTC 417, similar to old EKGs  Recent Labs: 06/09/2017: ALT 35; BUN 15; Creatinine, Ser 0.88; Potassium 3.7; Sodium 133 06/14/2017: TSH 1.189 06/19/2017: Hemoglobin 14.1; Platelets 335.0   Recent Lipid Panel Lab Results  Component Value Date/Time   CHOL 150 06/14/2017 06:29 AM   TRIG 116 06/14/2017 06:29 AM   HDL 61 06/14/2017 06:29 AM   CHOLHDL 2.5 06/14/2017 06:29 AM   LDLCALC 66 06/14/2017 06:29 AM    Physical Exam:    VS:  BP 130/86   Pulse 71   Ht 5' 10"  (1.778 m)   Wt 178 lb 12.8 oz (81.1 kg)   BMI 25.66 kg/m  Wt Readings from Last 3 Encounters:  05/01/18 178 lb 12.8 oz (81.1 kg)  04/19/18 181 lb (82.1 kg)  12/07/17 182 lb (82.6 kg)     Physical Exam  Constitutional: He is oriented to person, place, and time. He appears well-developed and well-nourished. No distress.  HENT:  Head: Normocephalic and atraumatic.  Eyes: No scleral icterus.  Neck: No JVD present.  Cardiovascular: Normal rate and regular rhythm.  No murmur heard. Pulmonary/Chest: Effort normal. He has no rales.  Abdominal: Soft. He exhibits no distension.  Musculoskeletal: He exhibits no edema.  Neurological: He is alert and oriented to person, place, and time.  Skin: Skin is warm and dry.    ASSESSMENT & PLAN:    Coronary artery disease involving native coronary artery of native heart with angina pectoris [facility]) Status post prior drug-eluting stent to the LAD and diagonal 2011.  He had a non-ST elevation myocardial infarction in 2012 without culprit lesion, and was treated medically.  Recent echocardiogram demonstrated normal LV function.  Recent nuclear stress test demonstrated no ischemia.  He is currently doing well with rare episodes of angina.  Continue current  medical regimen which includes clopidogrel, diltiazem and rosuvastatin.  Essential hypertension The patient's blood pressure is controlled on his current regimen.  Continue current therapy.  Continue to monitor.   Dispo:  Return in about 1 year (around 05/02/2019) for Routine Follow Up, w/ Richardson Dopp, PA-C.   Medication Adjustments/Labs and Tests Ordered: Current medicines are reviewed at length with the patient today.  Concerns regarding medicines are outlined above.  Tests Ordered: Orders Placed This Encounter  Procedures  . EKG 12-Lead   Medication Changes: Meds ordered this encounter  Medications  . nitroGLYCERIN (NITROSTAT) 0.4 MG SL tablet    Sig: Place 1 tablet (0.4 mg total) under the tongue every 5 (five) minutes as needed for chest pain.    Dispense:  25 tablet    Refill:  11    Order Specific Question:   Supervising Provider    Answer:   Thayer Headings [[id number]]    Signed, Richardson Dopp, PA-C  05/01/2018 11:03 AM    [facility] [address], [place], [place]  [address] Phone: [phone]; Fax: [phone]

## 2018-05-01 NOTE — Patient Instructions (Addendum)
Medication Instructions:  1. A REFILL HAS BEEN SENT IN FOR NITROGLYCERIN  Labwork: NONE ORDERED TODAY  Testing/Procedures: NONE ORDERED TODAY  Follow-Up:  [employer], PAC IN 1 YEAR   Any Other Special Instructions Will Be Listed Below (If Applicable).     If you need a refill on your cardiac medications before your next appointment, please call your pharmacy.

## 2018-05-07 ENCOUNTER — Ambulatory Visit (HOSPITAL_COMMUNITY): Payer: Medicare Other | Admitting: Licensed Clinical Social Worker

## 2018-05-07 ENCOUNTER — Encounter (HOSPITAL_COMMUNITY): Payer: Self-pay | Admitting: Licensed Clinical Social Worker

## 2018-05-07 DIAGNOSIS — F4310 Post-traumatic stress disorder, unspecified: Secondary | ICD-10-CM

## 2018-05-07 DIAGNOSIS — F3162 Bipolar disorder, current episode mixed, moderate: Secondary | ICD-10-CM | POA: Diagnosis not present

## 2018-05-07 NOTE — Progress Notes (Signed)
   THERAPIST PROGRESS NOTE  Session Time: 1:30pm-2:20pm  Participation Level: Active  Behavioral Response: Well GroomedAlertEuthymic  Type of Therapy: Individual Therapy  Treatment Goals addressed: improve psychiatric symptoms, Controlled Behavior, Moderated Mood, Deliberative Speech (improved social functioning), Improve Unhelpful Thought Patterns, Emotional Regulation Skills (Moderate moods, anger management, stress management), Feel and express a full Range of Emotions, Learn about Diagnosis, Healthy Coping Skills  Interventions: CBT, Motivational Interviewing, grounding techniques and mindfulness techniques, and psychoeducation  Summary: Justin [name] is a 72 y.o. male who presents with Bipolar 1 Disorder, Mixed, Moderate and Dissociative Amnesia, Grief, and PTSD  Suicidal/Homicidal: No - without intent/plan  Therapist Response:  Justin [name] met with clinician for an individual session. Justin [name] discussed his psychiatric symptoms, his current life events and his homework. Justin [name] reports that after a depressive episode about 2 weeks ago, he had a dream where his wife came to him and told him that it's "okay to let go" and that she wants him to be happy. Clinician explored the impact of this experience and processed changes that have come about since then. Clinician identified the misconception about acceptance as being "happy". Clinician provided feedback about the importance of focusing on the positive, happy memories. Clinician encouraged Justin [name] to maintain a gratitude journal in order to bring his focus and intention on positivity. Clinician also discussed the importance of re-training his brain to focus on happy thoughts.   Plan: Return again in 2-3 weeks.  Diagnosis:     Axis I: Bipolar 1 Disorder, Mixed, Moderate and Dissociative Amnesia, Grief, and PTSD  [name], LCSW 05/07/2018

## 2018-05-14 ENCOUNTER — Other Ambulatory Visit (HOSPITAL_COMMUNITY): Payer: Self-pay | Admitting: Psychiatry

## 2018-05-14 ENCOUNTER — Telehealth (HOSPITAL_COMMUNITY): Payer: Self-pay

## 2018-05-14 DIAGNOSIS — F411 Generalized anxiety disorder: Secondary | ICD-10-CM

## 2018-05-14 MED ORDER — ALPRAZOLAM 0.5 MG PO TABS: 0.5000 mg | ORAL_TABLET | Freq: Two times a day (BID) | ORAL | 0 refills | Status: DC | PRN

## 2018-05-14 NOTE — Telephone Encounter (Signed)
done

## 2018-05-14 NOTE — Telephone Encounter (Signed)
Patient needs a refill of Xanax 0.5mg  sent to [facility]. Next appointment is 06-06-18. Please advise

## 2018-05-14 NOTE — Telephone Encounter (Signed)
Called left voicemail message informing patient that prescription had been called into pharmacy

## 2018-05-24 ENCOUNTER — Ambulatory Visit (INDEPENDENT_AMBULATORY_CARE_PROVIDER_SITE_OTHER): Payer: Medicare Other | Admitting: Licensed Clinical Social Worker

## 2018-05-24 ENCOUNTER — Encounter (HOSPITAL_COMMUNITY): Payer: Self-pay | Admitting: Licensed Clinical Social Worker

## 2018-05-24 DIAGNOSIS — F3160 Bipolar disorder, current episode mixed, unspecified: Secondary | ICD-10-CM

## 2018-05-24 DIAGNOSIS — F4310 Post-traumatic stress disorder, unspecified: Secondary | ICD-10-CM

## 2018-05-24 NOTE — Progress Notes (Signed)
   THERAPIST PROGRESS NOTE  Session Time: 12:30pm-1:20pm  Participation Level: Active  Behavioral Response: Well GroomedAlertEuthymic  Type of Therapy: Individual Therapy  Treatment Goals addressed: improve psychiatric symptoms, Controlled Behavior, Moderated Mood, Deliberative Speech (improved social functioning), Improve Unhelpful Thought Patterns, Emotional Regulation Skills (Moderate moods, anger management, stress management), Feel and express a full Range of Emotions, Learn about Diagnosis, Healthy Coping Skills, Recall the Traumatic event without being overwhelmed    Interventions: CBT, Motivational Interviewing, grounding techniques and mindfulness techniques, and psychoeducation  Summary: Rylyn Ranganathan is a 72 y.o. male who presents with Bipolar 1 Disorder, Mixed, Moderate and Dissociative Amnesia, Grief, and PTSD  Suicidal/Homicidal: No - without intent/plan  Therapist Response:  Tadarrius met with clinician for an individual session. Hussein discussed his psychiatric symptoms, his current life events and his homework. Caron reports that he has been doing better over the past week or so. He identified that he had a wonderful birthday and shared the events of the day. He also reported that with happiness comes sadness for him and shared that his sister was hateful to him on that day. Clinician explored [name]'s response to his sister's attitude and noted that he decided to block her from his phone and not deal with her anymore. Clinician processed after effects of that decision. [name] reported feeling a bit more empowered now that he made the decision not to talk to her. Clinician discussed the importance of choosing positivity in his life and to reduce contact with those who are hurtful and negative.  While talking about sister, [name] became shaky and anxious. Clinician engaged [name] in 5 senses mindfulness intervention. [name] was able to focus his attention and changed his thoughts, which  allowed him to relax prior to leaving the session.     Plan: Return again in 2-3 weeks.  Diagnosis:     Axis I: Bipolar 1 Disorder, Mixed, Moderate and Dissociative Amnesia, Grief, and PTSD  [name], LCSW 05/24/2018

## 2018-05-28 ENCOUNTER — Other Ambulatory Visit: Payer: Self-pay | Admitting: *Deleted

## 2018-05-28 MED ORDER — CLOPIDOGREL BISULFATE 75 MG PO TABS: 75.0000 mg | ORAL_TABLET | Freq: Every day | ORAL | 11 refills | Status: DC

## 2018-05-28 NOTE — Telephone Encounter (Signed)
Patient called and requested a refill on clopidogrel from [facility]. Our office has never refilled this for the patient and he reports that he has been without this medication for two weeks as the pharmacy never sent a request here. Per epic this was last ordered by a Marvia Pickles, FNP which is the provider patient reported that is on the medication bottle and most likely where the refill request was sent. Okay to refill? Please advise. Thanks, MI

## 2018-06-05 ENCOUNTER — Ambulatory Visit (HOSPITAL_COMMUNITY): Payer: Medicare Other | Admitting: Licensed Clinical Social Worker

## 2018-06-05 ENCOUNTER — Encounter (HOSPITAL_COMMUNITY): Payer: Self-pay | Admitting: Licensed Clinical Social Worker

## 2018-06-05 DIAGNOSIS — F4310 Post-traumatic stress disorder, unspecified: Secondary | ICD-10-CM

## 2018-06-05 DIAGNOSIS — F3160 Bipolar disorder, current episode mixed, unspecified: Secondary | ICD-10-CM | POA: Diagnosis not present

## 2018-06-05 NOTE — Progress Notes (Signed)
   THERAPIST PROGRESS NOTE  Session Time: 12:30pm-1:20pm  Participation Level: Active  Behavioral Response: Well GroomedAlertDepressed  Type of Therapy: Individual Therapy  Treatment Goals addressed: improve psychiatric symptoms, Controlled Behavior, Moderated Mood, Deliberative Speech (improved social functioning), Improve Unhelpful Thought Patterns, Emotional Regulation Skills (Moderate moods, anger management, stress management), Feel and express a full Range of Emotions, Learn about Diagnosis, Healthy Coping Skills, Recall the Traumatic event without being overwhelmed    Interventions: CBT, Motivational Interviewing, grounding techniques and mindfulness techniques, and psychoeducation  Summary: Justin [name] is a 72 y.o. male who presents with Bipolar 1 Disorder, Mixed, Moderate and Dissociative Amnesia, Grief, and PTSD  Suicidal/Homicidal: No - without intent/plan  Therapist Response:  Yusif met with clinician for an individual session. Rush discussed his psychiatric symptoms, his current life events and his homework. [name] reports he has been having more mania lately, being unable to sleep, intense dreaming where he is talking to someone and then waking up sitting up with feet on the floor. [name] also reports increased energy and irritability. [name] processed frustration with himself for continuing to give sister power over his emotions. Clinician processed his relationship with sister and most recent interaction with him a few weeks ago. Clinician noted how much pain and frustration she causes and noted also how much emotional energy is spent on her with no gains. Clinician discussed options for stepping back from sister, both in communication and in thoughts. [name] agreed that redirecting thoughts using CBT coping skills would be useful. [name] reports ongoing positive interactions with son and granddaughters. He reports some improvement with daughter in law, but noted that they are two  very different types of people and they have different values.   Plan: Return again in 2-3 weeks.  Diagnosis:     Axis I: Bipolar 1 Disorder, Mixed, Moderate and Dissociative Amnesia, Grief, and PTSD  [name], LCSW 06/05/2018

## 2018-06-06 ENCOUNTER — Ambulatory Visit (HOSPITAL_COMMUNITY): Payer: Self-pay | Admitting: Psychiatry

## 2018-06-11 ENCOUNTER — Other Ambulatory Visit (HOSPITAL_COMMUNITY): Payer: Self-pay

## 2018-06-11 DIAGNOSIS — F411 Generalized anxiety disorder: Secondary | ICD-10-CM

## 2018-06-11 MED ORDER — ALPRAZOLAM 0.5 MG PO TABS: 0.5000 mg | ORAL_TABLET | Freq: Two times a day (BID) | ORAL | 0 refills | Status: DC | PRN

## 2018-06-19 ENCOUNTER — Ambulatory Visit (HOSPITAL_COMMUNITY): Payer: Medicare Other | Admitting: Licensed Clinical Social Worker

## 2018-06-19 ENCOUNTER — Encounter (HOSPITAL_COMMUNITY): Payer: Self-pay | Admitting: Licensed Clinical Social Worker

## 2018-06-19 DIAGNOSIS — F3160 Bipolar disorder, current episode mixed, unspecified: Secondary | ICD-10-CM | POA: Diagnosis not present

## 2018-06-19 DIAGNOSIS — F4310 Post-traumatic stress disorder, unspecified: Secondary | ICD-10-CM | POA: Diagnosis not present

## 2018-06-19 NOTE — Progress Notes (Signed)
   THERAPIST PROGRESS NOTE  Session Time: 12:30pm-1:30pm  Participation Level: Active  Behavioral Response: Well GroomedAlertEuthymic  Type of Therapy: Individual Therapy  Treatment Goals addressed: improve psychiatric symptoms, Controlled Behavior, Moderated Mood, Deliberative Speech (improved social functioning), Improve Unhelpful Thought Patterns, Emotional Regulation Skills (Moderate moods, anger management, stress management), Feel and express a full Range of Emotions, Learn about Diagnosis, Healthy Coping Skills, Recall the Traumatic event without being overwhelmed    Interventions: CBT, Motivational Interviewing, grounding techniques and mindfulness techniques, and psychoeducation  Summary: Justin [name] is a 72 y.o. male who presents with Bipolar 1 Disorder, Mixed, Moderate and Dissociative Amnesia, Grief, and PTSD  Suicidal/Homicidal: No - without intent/plan  Therapist Response:  Lancelot met with clinician for an individual session. [name] discussed his psychiatric symptoms, his current life events and his homework. [name] reports he has been feeling better overall. However, he reports ongoing intrusive thoughts and memories from the past. Clinician explored the impact of these memories and the associated sadness, worry and guilt. [name] discussed the feelings about losing his memory when his wife died. Clinician was able to connect these feelings with his feelings about leaving her for 15 years when they were divorced. Clinician encouraged [name] to change his outlook on this, as he and his wife had made amends when they got back together. [name] reports he is doing better with letting things go that would have otherwise bothered him as related to his son and daughter in law. He also identified the lack of physical contact with others, which has been very hard for him to deal with. Clinician encouraged [name] to consider adopting a dog, in order to encourage activities, movement,  and companionship.   Plan: Return again in 2 weeks.  Diagnosis:     Axis I: Bipolar 1 Disorder, Mixed, Moderate and Dissociative Amnesia, Grief, and PTSD  [name], LCSW 06/19/2018

## 2018-06-22 ENCOUNTER — Ambulatory Visit (HOSPITAL_COMMUNITY): Payer: Medicare Other | Admitting: Psychiatry

## 2018-06-22 ENCOUNTER — Encounter

## 2018-06-22 VITALS — BP 122/70 | Ht 70.5 in | Wt 183.0 lb

## 2018-06-22 DIAGNOSIS — F3162 Bipolar disorder, current episode mixed, moderate: Secondary | ICD-10-CM | POA: Diagnosis not present

## 2018-06-22 DIAGNOSIS — F4310 Post-traumatic stress disorder, unspecified: Secondary | ICD-10-CM

## 2018-06-22 DIAGNOSIS — F411 Generalized anxiety disorder: Secondary | ICD-10-CM | POA: Diagnosis not present

## 2018-06-22 MED ORDER — LAMOTRIGINE 150 MG PO TABS: 300.0000 mg | ORAL_TABLET | Freq: Every day | ORAL | 2 refills | Status: DC

## 2018-06-22 MED ORDER — DOXEPIN HCL 25 MG PO CAPS: 25.0000 mg | ORAL_CAPSULE | Freq: Every evening | ORAL | 1 refills | Status: DC | PRN

## 2018-06-22 MED ORDER — ARIPIPRAZOLE 5 MG PO TABS: 5.0000 mg | ORAL_TABLET | Freq: Every day | ORAL | 2 refills | Status: DC

## 2018-06-22 NOTE — Progress Notes (Signed)
[place] MD/PA/NP OP Progress Note  06/22/2018 10:23 AM Justin [name]  MRN:  [id number]  Chief Complaint: I am sleeping better.  Her depression is under control.  HPI: Justin [name] came for his follow-up appointment.  He is sleeping better since we added doxepin.  He reported his depression is better but he still have some time anxiety and mood irritability.  He admitted some time he forget taking his Abilify.  He started to have neck pain.  He has a neck surgery in the past and now his pain is getting worse.  He is scheduled to have MRI and he started doing physical therapy.  He is seeing Janett Billow for therapy.  He denies any crying spells, suicidal thoughts or any feeling of hopelessness or worthlessness.  He still have some family issues especially with his son but denies any major incident in recent months.  He continues to grieve about losing his wife and he is been talking to therapist regularly.  He has chronic PTSD but symptoms are controlled on medication.  He denies any major panic attack.  He takes Xanax as needed which helps his anxiety.  His energy level is good.  Patient denies any side effects with the medication.  He has no rash, itching, tremors or shakes.  Patient denies any drinking or using any illegal substances.  Visit Diagnosis:    ICD-10-CM   1. Generalized anxiety disorder F41.1 lamoTRIgine (LAMICTAL) 150 MG tablet  2. Bipolar 1 disorder, mixed, moderate (HCC) F31.62 lamoTRIgine (LAMICTAL) 150 MG tablet    ARIPiprazole (ABILIFY) 5 MG tablet  3. PTSD (post-traumatic stress disorder) F43.10 doxepin (SINEQUAN) 25 MG capsule    lamoTRIgine (LAMICTAL) 150 MG tablet    Past Psychiatric History: Reviewed Patient has bipolar disorder with multiple hospitalization.  He has a history of suicidal attempt in the past by taking overdose on Ambien, Vistaril and alcohol.  His last hospital was October [facility].  He tried Zyprexa, Cymbalta, Seroquel, Vistaril, Ambien,  Neurontin, Wellbutrin, Trileptal.  He had a reaction with Ativan and pain medication.  He was seeing Dr. Letta Moynahan in the past.  Patient also done intensive outpatient program.  Past Medical History:  Past Medical History:  Diagnosis Date  . Anxiety and depression   . Arthritis    knees, c-spine // gets lumbar ESI q 6 mos  . Bipolar disorder ([place])   . CAD (coronary artery disease)    [place] 08/2009 >> LHC ([facility]) - mLAD 70, Dx 65 >> PCI:  3x15 mm Endeavor DES to mLAD and 2.5x12 mm Endeavor DES to Dx // S/p NSTEMI >> LHC 8/12 ([facility]) - LM ok, LAD prox and mid 40; LAD stent ok, Dx stent ok, mRCA 30, mLCx 50 >> med Rx // ETT-Echo 2/17 ([facility]): Normal, EF 55-60 at rest  . Chicken pox   . 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 Grief   . History of echocardiogram    Echo 3/19: Mild concentric LVH, EF 60-65, normal wall motion, grade 1 diastolic dysfunction, mild AI, mildly dilated aortic root (40 mm), MAC, mild LAE, atrial septal lipomatous hypertrophy  . History of non-ST elevation myocardial infarction (NSTEMI)   . History of nuclear stress test    Nuclear stress test 3/19: EF 57, inferior/inferoseptal/inferolateral defect consistent with probable soft tissue attenuation (cannot exclude subendocardial scar), no ischemia, intermediate risk >> Echo 3/19 normal EF, normal wall motion  . History of pneumonia 2011  . History of prostatitis 1990s  .  Hx of adenomatous colonic polyps 06/22/2016  . Hyperlipidemia   . Hypertension   . Migraines    prior history  . PTSD (post-traumatic stress disorder)     Past Surgical History:  Procedure Laterality Date  . CERVICAL DISCECTOMY    . COLONOSCOPY  03/2017  . ELBOW SURGERY Left   . ELBOW SURGERY Right   . INGUINAL HERNIA REPAIR Bilateral    1996, 1997  . KNEE ARTHROSCOPY Right   . KNEE CARTILAGE SURGERY Left   . NASAL SEPTUM SURGERY    . STENT PLACEMENT VASCULAR ([facility])  09/02/2010  . TONSILLECTOMY      Family Psychiatric  History: Reviewed  Family History:  Family History  Problem Relation Age of Onset  . Arthritis Mother   . Heart disease Mother        s/p CABG  . Hypertension Mother   . Alzheimer's disease Mother   . Heart attack Mother   . Arthritis Father   . Hyperlipidemia Father   . Heart disease Father        s/p CABG  . Stroke Father   . Hypertension Father   . Heart attack Father   . Multiple sclerosis Sister   . Diabetes Sister   . Lung cancer Paternal Grandmother   . Diabetes Sister   . Heart attack Sister   . Pulmonary embolism Sister        died  . Lung cancer Maternal Aunt   . Lung cancer Paternal Aunt   . Depression Brother        suicide    Social History:  Social History   Socioeconomic History  . Marital status: Widowed    Spouse name: Not on file  . Number of children: 2  . Years of education: 62  . Highest education level: Not on file  Occupational History  . Occupation: Retired  Scientific laboratory technician  . Financial resource strain: Not on file  . Food insecurity:    Worry: Not on file    Inability: Not on file  . Transportation needs:    Medical: Not on file    Non-medical: Not on file  Tobacco Use  . Smoking status: Never Smoker  . Smokeless tobacco: Never Used  Substance and Sexual Activity  . Alcohol use: No    Alcohol/week: 0.0 standard drinks  . Drug use: No  . Sexual activity: Not Currently  Lifestyle  . Physical activity:    Days per week: Not on file    Minutes per session: Not on file  . Stress: Not on file  Relationships  . Social connections:    Talks on phone: Not on file    Gets together: Not on file    Attends religious service: Not on file    Active member of club or organization: Not on file    Attends meetings of clubs or organizations: Not on file    Relationship status: Not on file  Other Topics Concern  . Not on file  Social History Narrative   Retired, widowed in 2017    1 son / 1 daughter   2 caffeinated beverages daily no  alcohol or tobacco   Fun: Work out in the yard.   Denies religious beliefs effecting healthcare.    Worked at [employer] for 30 years    Allergies:  Allergies  Allergen Reactions  . Ambien [Zolpidem Tartrate] Other (See Comments)    Blackout, memory issues  . Seroquel [Quetiapine]  SEVERE NIGHTMARES, SLEEPWALK AND NIGHT DRIVE WITH NO RECOLLECTION UPON WAKENING    Metabolic Disorder Labs: Lab Results  Component Value Date   HGBA1C 4.5 (L) 06/19/2017   MPG 108 06/14/2017   Lab Results  Component Value Date   PROLACTIN 26.0 (H) 06/14/2017   Lab Results  Component Value Date   CHOL 150 06/14/2017   TRIG 116 06/14/2017   HDL 61 06/14/2017   CHOLHDL 2.5 06/14/2017   VLDL 23 06/14/2017   LDLCALC 66 06/14/2017   LDLCALC 55 03/26/2015   Lab Results  Component Value Date   TSH 1.189 06/14/2017   TSH 0.59 03/26/2015    Therapeutic Level Labs: No results found for: LITHIUM No results found for: VALPROATE No components found for:  CBMZ  Current Medications: Current Outpatient Medications  Medication Sig Dispense Refill  . ALPRAZolam (XANAX) 0.5 MG tablet Take 1 tablet (0.5 mg total) by mouth 2 (two) times daily as needed for anxiety. 60 tablet 0  . ARIPiprazole (ABILIFY) 5 MG tablet Take 1 tablet (5 mg total) by mouth daily. 30 tablet 2  . clopidogrel (PLAVIX) 75 MG tablet Take 1 tablet (75 mg total) by mouth daily. 30 tablet 11  . diltiazem (CARTIA XT) 180 MG 24 hr capsule Take 1 capsule (180 mg total) by mouth daily. 30 capsule 0  . doxepin (SINEQUAN) 25 MG capsule Take 25 mg by mouth at bedtime as needed.    . lamoTRIgine (LAMICTAL) 150 MG tablet Take 2 tablets (300 mg total) by mouth daily. For mood control 60 tablet 1  . nitroGLYCERIN (NITROSTAT) 0.4 MG SL tablet Place 1 tablet (0.4 mg total) under the tongue every 5 (five) minutes as needed for chest pain. 25 tablet 11  . rosuvastatin (CRESTOR) 20 MG tablet Take 1 tablet (20 mg total) by mouth daily. 30 tablet 11    No current facility-administered medications for this visit.      Musculoskeletal: Strength & Muscle Tone: within normal limits Gait & Station: normal Patient leans: N/A  Psychiatric Specialty Exam: ROS  There were no vitals taken for this visit.There is no height or weight on file to calculate BMI.  General Appearance: Casual  Eye Contact:  Good  Speech:  Clear and Coherent  Volume:  Normal  Mood:  Anxious  Affect:  Appropriate  Thought Process:  Goal Directed  Orientation:  Full (Time, Place, and Person)  Thought Content: Logical   Suicidal Thoughts:  No  Homicidal Thoughts:  No  Memory:  Immediate;   Good Recent;   Good Remote;   Good  Judgement:  Good  Insight:  Good  Psychomotor Activity:  Normal  Concentration:  Concentration: Good and Attention Span: Good  Recall:  Good  Fund of Knowledge: Good  Language: Good  Akathisia:  No  Handed:  Right  AIMS (if indicated): not done  Assets:  Communication Skills Desire for Improvement Housing Resilience  ADL's:  Intact  Cognition: WNL  Sleep:  Fair   Screenings: AIMS     Admission (Discharged) from 06/12/2017 in [facility] Total Score  0    AUDIT     Admission (Discharged) from 06/12/2017 in [facility]  Alcohol Use Disorder Identification Test Final Score (AUDIT)  0    Mini-Mental     Office Visit from 03/26/2015 in [facility]  Total Score (max 30 points )  30    PHQ2-9  Office Visit from 06/09/2016 in [facility] Counselor from 01/13/2016 in [facility] Office Visit from 03/26/2015 in [facility]  PHQ-2 Total Score  2  6  0  PHQ-9 Total Score  8  26  -       Assessment and Plan: Bipolar disorder type I.  Posttraumatic stress disorder.  Generalized anxiety disorder.  Patient is stable and doing better on his current medication.   He still has residual anxiety and mood irritability.  Reinforced to take the medication especially Abilify as prescribed to help the symptoms.  Recommended not to take trazodone since doxepin working for his sleep and he does not take every night.  Continue Xanax 0.5 mg half to 1 tablet as needed for severe panic attack anxiety attack.  Encouraged to see Janett Billow for CBT.  I will continue Abilify 5 mg daily, Lamictal 300 mg daily, doxepin 25 mg at bedtime and Xanax 0.5 mg half to 1 tablet as needed.  Recommended to call us back if is any question or any concern.  Follow-up in 3 months.     Kathlee Nations, MD 06/22/2018, 10:23 AM

## 2018-06-26 ENCOUNTER — Other Ambulatory Visit (HOSPITAL_COMMUNITY): Payer: Self-pay | Admitting: Psychiatry

## 2018-06-29 ENCOUNTER — Encounter: Payer: Self-pay | Admitting: Family

## 2018-06-29 ENCOUNTER — Ambulatory Visit (INDEPENDENT_AMBULATORY_CARE_PROVIDER_SITE_OTHER)
Admission: RE | Admit: 2018-06-29 | Discharge: 2018-06-29 | Disposition: A | Discharge status: Home / Self Care | Payer: Medicare Other | Source: Ambulatory Visit | Attending: Family | Admitting: Family

## 2018-06-29 ENCOUNTER — Ambulatory Visit: Payer: Medicare Other | Admitting: Family

## 2018-06-29 ENCOUNTER — Ambulatory Visit: Payer: Medicare Other | Admitting: *Deleted

## 2018-06-29 VITALS — BP 142/78 | HR 73 | Temp 97.7°F | Resp 18 | Ht 71.0 in | Wt 184.0 lb

## 2018-06-29 VITALS — BP 142/78 | HR 73 | Temp 97.7°F | Ht 71.0 in

## 2018-06-29 DIAGNOSIS — Z0000 Encounter for general adult medical examination without abnormal findings: Secondary | ICD-10-CM

## 2018-06-29 DIAGNOSIS — M25562 Pain in left knee: Secondary | ICD-10-CM

## 2018-06-29 DIAGNOSIS — M542 Cervicalgia: Secondary | ICD-10-CM | POA: Diagnosis not present

## 2018-06-29 MED ORDER — NAPROXEN 500 MG PO TABS: 500.0000 mg | ORAL_TABLET | Freq: Two times a day (BID) | ORAL | 0 refills | Status: DC

## 2018-06-29 MED ORDER — ACETAMINOPHEN-CODEINE #3 300-30 MG PO TABS: 1.0000 | ORAL_TABLET | Freq: Two times a day (BID) | ORAL | 0 refills | Status: DC | PRN

## 2018-06-29 NOTE — Patient Instructions (Addendum)
[facility] in [place], [place] in: [place] Address: [address] [place], [place] [address] Phone: [phone]  Continue doing brain stimulating activities (puzzles, reading, adult coloring books, staying active) to keep memory sharp.   Continue to eat heart healthy diet (full of fruits, vegetables, whole grains, lean protein, water--limit salt, fat, and sugar intake) and increase physical activity as tolerated.  www.auntbertha.com or down load app on smart phone  Aunt Berenice Primas website lists multiple social resources for individuals such as: food, health, money, house hold goods, transit, medical supplies, job training and legal services.  It is important to avoid accidents which may result in broken bones.  Here are a few ideas on how to make your home safer so you will be less likely to trip or fall.  1. Use nonskid mats or non slip strips in your shower or tub, on your bathroom floor and around sinks.  If you know that you have spilled water, wipe it up! 2. In the bathroom, it is important to have properly installed grab bars on the walls or on the edge of the tub.  Towel racks are NOT strong enough for you to hold onto or to pull on for support. 3. Stairs and hallways should have enough light.  Add lamps or night lights if you need ore light. 4. It is good to have handrails on both sides of the stairs if possible.  Always fix broken handrails right away. 5. It is important to see the edges of steps.  Paint the edges of outdoor steps white so you can see them better.  Put colored tape on the edge of inside steps. 6. Throw-rugs are dangerous because they can slide.  Removing the rugs is the best idea, but if they must stay, add adhesive carpet tape to prevent slipping. 7. Do not keep things on stairs or in the halls.  Remove small furniture that blocks the halls as it may cause you to trip.  Keep telephone  and electrical cords out of the way where you walk. 8. Always were sturdy, rubber-soled shoes for good support.  Never wear just socks, especially on the stairs.  Socks may cause you to slip or fall.  Do not wear full-length housecoats as you can easily trip on the bottom.  9. Place the things you use the most on the shelves that are the easiest to reach.  If you use a stepstool, make sure it is in good condition.  If you feel unsteady, DO NOT climb, ask for help. 10. If a health professional advises you to use a cane or walker, do not be ashamed.  These items can keep you from falling and breaking your bones.  Health Maintenance, Male A healthy lifestyle and preventive care is important for your health and wellness. Ask your health care provider about what schedule of regular examinations is right for you. What should I know about weight and diet? Eat a Healthy Diet Eat plenty of vegetables, fruits, whole grains, low-fat dairy products, and lean protein. Do not eat a lot of foods high in solid fats, added sugars, or salt.  Maintain a Healthy Weight Regular exercise can help you achieve or maintain a healthy weight. You should: Do at least 150 minutes of exercise each week. The exercise should increase your heart rate and make you sweat (moderate-intensity exercise). Do strength-training exercises at least twice a week.  Watch Your Levels of  Cholesterol and Blood Lipids Have your blood tested for lipids and cholesterol every 5 years starting at 72 years of age. If you are at high risk for heart disease, you should start having your blood tested when you are 72 years old. You may need to have your cholesterol levels checked more often if: Your lipid or cholesterol levels are high. You are older than 72 years of age. You are at high risk for heart disease.  What should I know about cancer screening? Many types of cancers can be detected early and may often be prevented. Lung Cancer You should  be screened every year for lung cancer if: You are a current smoker who has smoked for at least 30 years. You are a former smoker who has quit within the past 15 years. Talk to your health care provider about your screening options, when you should start screening, and how often you should be screened.  Colorectal Cancer Routine colorectal cancer screening usually begins at 72 years of age and should be repeated every 5-10 years until you are 72 years old. You may need to be screened more often if early forms of precancerous polyps or small growths are found. Your health care provider may recommend screening at an earlier age if you have risk factors for colon cancer. Your health care provider may recommend using home test kits to check for hidden blood in the stool. A small camera at the end of a tube can be used to examine your colon (sigmoidoscopy or colonoscopy). This checks for the earliest forms of colorectal cancer.  Prostate and Testicular Cancer Depending on your age and overall health, your health care provider may do certain tests to screen for prostate and testicular cancer. Talk to your health care provider about any symptoms or concerns you have about testicular or prostate cancer.  Skin Cancer Check your skin from head to toe regularly. Tell your health care provider about any new moles or changes in moles, especially if: There is a change in a mole's size, shape, or color. You have a mole that is larger than a pencil eraser. Always use sunscreen. Apply sunscreen liberally and repeat throughout the day. Protect yourself by wearing long sleeves, pants, a wide-brimmed hat, and sunglasses when outside.  What should I know about heart disease, diabetes, and high blood pressure? If you are 32-69 years of age, have your blood pressure checked every 3-5 years. If you are 66 years of age or older, have your blood pressure checked every year. You should have your blood pressure measured  twice-once when you are at a hospital or clinic, and once when you are not at a hospital or clinic. Record the average of the two measurements. To check your blood pressure when you are not at a hospital or clinic, you can use: An automated blood pressure machine at a pharmacy. A home blood pressure monitor. Talk to your health care provider about your target blood pressure. If you are between 96-54 years old, ask your health care provider if you should take aspirin to prevent heart disease. Have regular diabetes screenings by checking your fasting blood sugar level. If you are at a normal weight and have a low risk for diabetes, have this test once every three years after the age of 2. If you are overweight and have a high risk for diabetes, consider being tested at a younger age or more often. A one-time screening for abdominal aortic aneurysm (AAA) by ultrasound is recommended  for men aged 25-75 years who are current or former smokers. What should I know about preventing infection? Hepatitis B If you have a higher risk for hepatitis B, you should be screened for this virus. Talk with your health care provider to find out if you are at risk for hepatitis B infection. Hepatitis C Blood testing is recommended for: Everyone born from [birth year] through 1965. Anyone with known risk factors for hepatitis C.  Sexually Transmitted Diseases (STDs) You should be screened each year for STDs including gonorrhea and chlamydia if: You are sexually active and are younger than 72 years of age. You are older than 72 years of age and your health care provider tells you that you are at risk for this type of infection. Your sexual activity has changed since you were last screened and you are at an increased risk for chlamydia or gonorrhea. Ask your health care provider if you are at risk. Talk with your health care provider about whether you are at high risk of being infected with HIV. Your health care provider may  recommend a prescription medicine to help prevent HIV infection.  What else can I do? Schedule regular health, dental, and eye exams. Stay current with your vaccines (immunizations). Do not use any tobacco products, such as cigarettes, chewing tobacco, and e-cigarettes. If you need help quitting, ask your health care provider. Limit alcohol intake to no more than 2 drinks per day. One drink equals 12 ounces of beer, 5 ounces of , or 1 ounces of hard liquor. Do not use street drugs. Do not share needles. Ask your health care provider for help if you need support or information about quitting drugs. Tell your health care provider if you often feel depressed. Tell your health care provider if you have ever been abused or do not feel safe at home. This information is not intended to replace advice given to you by your health care provider. Make sure you discuss any questions you have with your health care provider. Document Released: 02/04/2008 Document Revised: 04/06/2016 Document Reviewed: 05/12/2015 Elsevier Interactive Patient Education  [employer]. 11.

## 2018-06-29 NOTE — Progress Notes (Addendum)
Subjective:   Justin [name] is a 72 y.o. male who presents for an Initial Medicare Annual Wellness Visit.  Review of Systems  No ROS.  Medicare Wellness Visit. Additional risk factors are reflected in the social history.  Cardiac Risk Factors include: advanced age (>24mn, >>74women);dyslipidemia;male gender Sleep patterns: gets up 2 times nightly to void and sleeps 6-7 hours nightly.    Home Safety/Smoke Alarms: Feels safe in home. Smoke alarms in place.  Living environment; residence and Firearm Safety: 1-story house/ trailer, no firearms.Lives alone, no needs for DME, good support system Seat Belt Safety/Bike Helmet: Wears seat belt.   PSA-  Lab Results  Component Value Date   PSA 2.96 06/19/2017   PSA 1.36 06/09/2016   PSA 1.48 03/26/2015      Objective:    Today's Vitals   06/29/18 1144  BP: (!) 142/78  Pulse: 73  Resp: 18  Temp: 97.7 F (36.5 C)  SpO2: 98%  Weight: 184 lb (83.5 kg)  Height: _0  (1.803 m)  PainSc: 8    Body mass index is 25.66 kg/m.  Advanced Directives 06/29/2018 06/09/2017 06/09/2017 06/15/2016 05/03/2015 12/26/2014  Does Patient Have a Medical Advance Directive? Yes _1   Type of [place] - - - - -  Copy of [place]? No - copy requested - - - - -  Would patient like information on creating a medical advance directive? - No - Patient declined No - Patient declined - - -  Some encounter information is confidential and restricted. Go to Review Flowsheets activity to see all data.    Current Medications (verified) Outpatient Encounter Medications as of 06/29/2018  Medication Sig  . ALPRAZolam (XANAX) 0.5 MG tablet Take 1 tablet (0.5 mg total) by mouth 2 (two) times daily as needed for anxiety.  . ARIPiprazole (ABILIFY) 5 MG tablet Take 1 tablet (5 mg total) by mouth daily.  . clopidogrel (PLAVIX) 75 MG tablet Take 1 tablet (75 mg total) by mouth daily.    .Marland Kitchendiltiazem (CARTIA XT) 180 MG 24 hr capsule Take 1 capsule (180 mg total) by mouth daily.  .Marland Kitchendoxepin (SINEQUAN) 25 MG capsule Take 1 capsule (25 mg total) by mouth at bedtime as needed.  . lamoTRIgine (LAMICTAL) 150 MG tablet Take 2 tablets (300 mg total) by mouth daily. For mood control  . nitroGLYCERIN (NITROSTAT) 0.4 MG SL tablet Place 1 tablet (0.4 mg total) under the tongue every 5 (five) minutes as needed for chest pain.  . rosuvastatin (CRESTOR) 20 MG tablet Take 1 tablet (20 mg total) by mouth daily.   No facility-administered encounter medications on file as of 06/29/2018.     Allergies (verified) Ambien [zolpidem tartrate] and Seroquel [quetiapine]   History: Past Medical History:  Diagnosis Date  . Anxiety and depression   . Arthritis    knees, c-spine // gets lumbar ESI q 6 mos  . [place]   . CAD (coronary artery disease)    [place] [facility] ([facility]) - mLAD 70, Dx 65 >> PCI:  3x15 mm Endeavor DES to mLAD and 2.5x12 mm Endeavor DES to Dx // S/p NSTEMI >> LHC 8/12 ([facility]) - LM ok, LAD prox and mid 40; LAD stent ok, Dx stent ok, mRCA 30, mLCx 50 >> med Rx // ETT-Echo 2/17 ([facility]): Normal, EF 55-60 at rest  . Chicken pox   . 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 Grief   . History of echocardiogram    Echo 3/19: Mild concentric LVH, EF 60-65, normal wall motion, grade 1 diastolic dysfunction, mild AI, mildly dilated aortic root (40 mm), MAC, mild LAE, atrial septal lipomatous hypertrophy  . History of non-ST elevation myocardial infarction (NSTEMI)   . History of nuclear stress test    Nuclear stress test 3/19: EF 57, inferior/inferoseptal/inferolateral defect consistent with probable soft tissue attenuation (cannot exclude subendocardial scar), no ischemia, intermediate risk >> Echo 3/19 normal EF, normal wall motion  . History of pneumonia 2011  . History of prostatitis 1990s  . Hx of adenomatous colonic polyps 06/22/2016  . Hyperlipidemia   .  Hypertension   . Migraines    prior history  . PTSD (post-traumatic stress disorder)    Past Surgical History:  Procedure Laterality Date  . CERVICAL DISCECTOMY    . COLONOSCOPY  03/2017  . ELBOW SURGERY Left   . ELBOW SURGERY Right   . INGUINAL HERNIA REPAIR Bilateral    1996, 1997  . KNEE ARTHROSCOPY Right   . KNEE CARTILAGE SURGERY Left   . NASAL SEPTUM SURGERY    . STENT PLACEMENT VASCULAR ([place] HX)  09/02/2010  . TONSILLECTOMY     Family History  Problem Relation Age of Onset  . Arthritis Mother   . Heart disease Mother        s/p CABG  . Hypertension Mother   . Alzheimer's disease Mother   . Heart attack Mother   . Arthritis Father   . Hyperlipidemia Father   . Heart disease Father        s/p CABG  . Stroke Father   . Hypertension Father   . Heart attack Father   . Multiple sclerosis Sister   . Diabetes Sister   . Lung cancer Paternal Grandmother   . Diabetes Sister   . Heart attack Sister   . Pulmonary embolism Sister        died  . Lung cancer Maternal Aunt   . Lung cancer Paternal Aunt   . Depression Brother        suicide   Social History   Socioeconomic History  . Marital status: Widowed    Spouse name: Not on file  . Number of children: 2  . Years of education: 42  . Highest education level: Not on file  Occupational History  . Occupation: Retired  Scientific laboratory technician  . Financial resource strain: Somewhat hard  . Food insecurity:    Worry: Sometimes true    Inability: Sometimes true  . Transportation needs:    Medical: No    Non-medical: No  Tobacco Use  . Smoking status: Never Smoker  . Smokeless tobacco: Never Used  Substance and Sexual Activity  . Alcohol use: No    Alcohol/week: 0.0 standard drinks  . Drug use: No  . Sexual activity: Not Currently  Lifestyle  . Physical activity:    Days per week: 0 days    Minutes per session: 0 min  . Stress: Rather much  Relationships  . Social connections:    Talks on phone: More than three  times a week    Gets together: More than three times a week    Attends religious service: Never    Active member of club or organization: No    Attends meetings of clubs or organizations: Never    Relationship status: Widowed  Other Topics Concern  . Not on file  Social  History Narrative   Retired, widowed in 2017    1 son / 1 daughter   2 caffeinated beverages daily no alcohol or tobacco   Fun: Work out in the yard.   Denies religious beliefs effecting healthcare.    Worked at [employer] for 30 years   Tobacco Counseling Counseling given: Not Answered  Activities of Daily Living In your present state of health, do you have any difficulty performing the following activities: 06/29/2018  Hearing? N  Vision? N  Difficulty concentrating or making decisions? N  Walking or climbing stairs? N  Dressing or bathing? N  Doing errands, shopping? N  Preparing Food and eating ? N  Using the Toilet? N  In the past six months, have you accidently leaked urine? N  Do you have problems with loss of bowel control? N  Managing your Medications? N  Managing your Finances? N  Housekeeping or managing your Housekeeping? N  Some recent data might be hidden     Immunizations and Health Maintenance Immunization History  Administered Date(s) Administered  . Influenza, High Dose Seasonal PF 05/05/2016, 05/22/2018  . Influenza,inj,Quad PF,6+ Mos 06/26/2015  . Pneumococcal Conjugate-13 08/07/2015, 05/05/2016  . Pneumococcal Polysaccharide-23 06/09/2016  . Td 03/26/2015  . Zoster 03/26/2015  . Zoster Recombinat (Shingrix) 10/03/2016   There are no preventive care reminders to display for this patient.  Patient Care Team: Biagio Borg, MD as PCP - General (Internal Medicine) Sherren Mocha, MD as PCP - Cardiology (Cardiology) Sharmon Revere as Physician Assistant (Cardiology) Berenice Primas, MD as Referring Physician (Orthopedic Surgery) [name], LCSW as Counselor  (Licensed Clinical Social Worker)  Indicate any recent Medical Services you may have received from other than [facility] providers in the past year (date may be approximate).    Assessment:   This is a routine wellness examination for [place]. Physical assessment deferred to PCP.   Hearing/Vision screen  Visual Acuity Screening   Right eye Left eye Both eyes  Without correction:   20/40  With correction:     Comments: Education provided regarding eye health and annual exams.   Hearing Screening Comments: Able to hear conversational tones w/o difficulty.   Dietary issues and exercise activities discussed: Current Exercise Habits: The patient does not participate in regular exercise at present Diet (meal preparation, eat out, water intake, caffeinated beverages, dairy products, fruits and vegetables): in general, an "unhealthy" diet, on average, 1 meals per day Reports poor appetite    Discussed supplementing with Ensure, samples and coupons provided. Discussed eating frequently. Encouraged patient to increase daily water and healthy fluid intake.  Goals   None    Depression Screen PHQ 2/9 Scores 06/29/2018 06/09/2016 03/26/2015  PHQ - 2 Score 3 2 0  PHQ- 9 Score 10 8 -  Some encounter information is confidential and restricted. Go to Review Flowsheets activity to see all data.    Fall Risk Fall Risk  06/29/2018 06/19/2017 06/09/2016 03/26/2015  Falls in the past year? 1 Yes No No  Number falls in past yr: 1 1 - -  Injury with Fall? 1 Yes - -  Risk for fall due to : Impaired balance/gait - - -  Follow up Falls prevention discussed;Education provided - - -   Cognitive Function: MMSE - Mini Mental State Exam 03/26/2015  Orientation to time 5  Orientation to Place 5  Registration 3  Attention/ Calculation 5  Recall 3  Language- name 2 objects 2  Language- repeat 1  Language- follow 3 step command 3  Language- read & follow direction 1  Write a sentence 1  Copy design 1  Total  score 30       Ad8 score reviewed for issues:  Issues making decisions: no  Less interest in hobbies / activities: no  Repeats questions, stories (family complaining): no  Trouble using ordinary gadgets (microwave, computer, phone):no  Forgets the month or year: no  Mismanaging finances: no  Remembering appts: no  Daily problems with thinking and/or memory: no Ad8 score is= 0  Screening Tests Health Maintenance  Topic Date Due  . COLONOSCOPY  06/15/2021  . TETANUS/TDAP  03/25/2025  . INFLUENZA VACCINE  Completed  . Hepatitis C Screening  Completed  . PNA vac Low Risk Adult  Completed      Plan:    Senior and social resources provided for help with affording medications, utilities, and food.   Continue doing brain stimulating activities (puzzles, reading, adult coloring books, staying active) to keep memory sharp.   Continue to eat heart healthy diet (full of fruits, vegetables, whole grains, lean protein, water--limit salt, fat, and sugar intake) and increase physical activity as tolerated.  I have personally reviewed and noted the following in the patient's chart:   . Medical and social history . Use of alcohol, tobacco or illicit drugs  . Current medications and supplements . Functional ability and status . Nutritional status . Physical activity . Advanced directives . List of other physicians . Vitals . Screenings to include cognitive, depression, and falls . Referrals and appointments  In addition, I have reviewed and discussed with patient certain preventive protocols, quality metrics, and best practice recommendations. A written personalized care plan for preventive services as well as general preventive health recommendations were provided to patient.     Michiel Cowboy, RN   06/29/2018      Medical screening examination/treatment/procedure(s) were performed by non-physician practitioner and as supervising physician I was immediately available for  consultation/collaboration. I agree with above. Cathlean Cower, MD

## 2018-06-29 NOTE — Progress Notes (Signed)
Justin [name] is a 72 y.o. male with the following history as recorded in EpicCare:  Patient Active Problem List   Diagnosis Date Noted  . Neck pain 12/08/2017  . Hyperlipidemia 10/30/2017  . Anemia 06/19/2017  . Hyperglycemia 06/19/2017  . Elevated prolactin level ([place]) 06/19/2017  . Closed fracture of right hand 06/19/2017  . Bipolar affective disorder, depressed, severe ([place]) 06/12/2017  . Benzodiazepine withdrawal with complication ([place]) 16/05/9603  . Nail lesion 02/25/2017  . Hx of adenomatous colonic polyps 06/22/2016  . Memory changes 06/09/2016  . CAD (coronary artery disease)   . Right knee pain 01/01/2016  . Olecranon bursitis of right elbow 12/10/2015  . Bipolar I disorder ([place]) 06/26/2015  . Routine general medical examination at a health care facility 03/26/2015  . Medicare annual wellness visit, subsequent 03/26/2015  . Rash and nonspecific skin eruption 03/13/2015  . Generalized anxiety disorder 12/23/2014  . Low back pain 12/23/2014  . Essential hypertension 12/23/2014    Current Outpatient Medications  Medication Sig Dispense Refill  . ALPRAZolam (XANAX) 0.5 MG tablet Take 1 tablet (0.5 mg total) by mouth 2 (two) times daily as needed for anxiety. 60 tablet 0  . ARIPiprazole (ABILIFY) 5 MG tablet Take 1 tablet (5 mg total) by mouth daily. 30 tablet 2  . clopidogrel (PLAVIX) 75 MG tablet Take 1 tablet (75 mg total) by mouth daily. 30 tablet 11  . diltiazem (CARTIA XT) 180 MG 24 hr capsule Take 1 capsule (180 mg total) by mouth daily. 30 capsule 0  . doxepin (SINEQUAN) 25 MG capsule Take 1 capsule (25 mg total) by mouth at bedtime as needed. 30 capsule 1  . lamoTRIgine (LAMICTAL) 150 MG tablet Take 2 tablets (300 mg total) by mouth daily. For mood control 60 tablet 2  . nitroGLYCERIN (NITROSTAT) 0.4 MG SL tablet Place 1 tablet (0.4 mg total) under the tongue every 5 (five) minutes as needed for chest pain. 25 tablet 11  . rosuvastatin (CRESTOR) 20 MG tablet Take 1  tablet (20 mg total) by mouth daily. 30 tablet 11  . acetaminophen-codeine (TYLENOL #3) 300-30 MG tablet Take 1 tablet by mouth every 12 (twelve) hours as needed. for pain 20 tablet 0   No current facility-administered medications for this visit.     Allergies: Ambien [zolpidem tartrate] and Seroquel [quetiapine]  Past Medical History:  Diagnosis Date  . Anxiety and depression   . Arthritis    knees, c-spine // gets lumbar ESI q 6 mos  . Bipolar disorder ([place])   . CAD (coronary artery disease)    [place] 08/[place] >> LHC ([facility]) - mLAD 70, Dx 65 >> PCI:  3x15 mm Endeavor DES to mLAD and 2.5x12 mm Endeavor DES to Dx // S/p NSTEMI >> LHC 8/12 ([facility]) - LM ok, LAD prox and mid 40; LAD stent ok, Dx stent ok, mRCA 30, mLCx 50 >> med Rx // ETT-Echo 2/17 ([facility]): Normal, EF 55-60 at rest  . Chicken pox   . 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 Grief   . History of echocardiogram    Echo 3/19: Mild concentric LVH, EF 60-65, normal wall motion, grade 1 diastolic dysfunction, mild AI, mildly dilated aortic root (40 mm), MAC, mild LAE, atrial septal lipomatous hypertrophy  . History of non-ST elevation myocardial infarction (NSTEMI)   . History of nuclear stress test    Nuclear stress test 3/19: EF 57, inferior/inferoseptal/inferolateral defect consistent with probable soft tissue attenuation (cannot exclude subendocardial scar), no  ischemia, intermediate risk >> Echo 3/19 normal EF, normal wall motion  . History of pneumonia 2011  . History of prostatitis 1990s  . Hx of adenomatous colonic polyps 06/22/2016  . Hyperlipidemia   . Hypertension   . Migraines    prior history  . PTSD (post-traumatic stress disorder)     Past Surgical History:  Procedure Laterality Date  . CERVICAL DISCECTOMY    . COLONOSCOPY  03/2017  . ELBOW SURGERY Left   . ELBOW SURGERY Right   . INGUINAL HERNIA REPAIR Bilateral    1996, 1997  . KNEE ARTHROSCOPY Right   . KNEE CARTILAGE SURGERY Left   . NASAL SEPTUM  SURGERY    . STENT PLACEMENT VASCULAR ([place] [facility])  09/02/2010  . TONSILLECTOMY      Family History  Problem Relation Age of Onset  . Arthritis Mother   . Heart disease Mother        s/p CABG  . Hypertension Mother   . Alzheimer's disease Mother   . Heart attack Mother   . Arthritis Father   . Hyperlipidemia Father   . Heart disease Father        s/p CABG  . Stroke Father   . Hypertension Father   . Heart attack Father   . Multiple sclerosis Sister   . Diabetes Sister   . Lung cancer Paternal Grandmother   . Diabetes Sister   . Heart attack Sister   . Pulmonary embolism Sister        died  . Lung cancer Maternal Aunt   . Lung cancer Paternal Aunt   . Depression Brother        suicide    Social History   Tobacco Use  . Smoking status: Never Smoker  . Smokeless tobacco: Never Used  Substance Use Topics  . Alcohol use: No    Alcohol/week: 0.0 standard drinks    Subjective:  Left knee pain x 1 day; notes that knee gave out on him today and caused him to fall; knee is painful/ swollen; notes that pain was so severe he actually became nauseated; under care of [facility] already for neck issues; also thinks he has had some type of knee surgery in the past but not sure which knee?    Objective:  Vitals:   06/29/18 1318  BP: (!) 142/78  Pulse: 73  Temp: 97.7 F (36.5 C)  TempSrc: Oral  SpO2: 99%  Height: 5' 11"  (1.803 m)    General: Well developed, well nourished, in no acute distress  Skin : Warm and dry.  Head: Normocephalic and atraumatic  Lungs: Respirations unlabored;  Musculoskeletal: No deformities; marked swelling/ tenderness over left knee/ LROM due to pain  Extremities: No edema, cyanosis, clubbing  Vessels: Symmetric bilaterally  Neurologic: Alert and oriented; speech intact; face symmetrical; moves all extremities well; CNII-XII intact without focal deficit   Assessment:  1. Acute pain of left knee   2. Neck pain     Plan:  Concern for  possible meniscal tear based on presentation; will update X-ray today; short-term refill given on Tylenol #3 to help with pain for upcoming weekend. He is encouraged to go ahead and call his orthopedist to try and be seen as soon as possible.   No follow-ups on file.  Orders Placed This Encounter  Procedures  . DG Knee Complete 4 Views Left    Standing Status:   Future    Number of Occurrences:   1  Standing Expiration Date:   08/30/2019    Order Specific Question:   Reason for Exam (SYMPTOM  OR DIAGNOSIS REQUIRED)    Answer:   left knee pain    Order Specific Question:   Preferred imaging location?    Answer:   [facility]    Order Specific Question:   Radiology Contrast Protocol - do NOT remove file path    Answer:   [email or web address]    Requested Prescriptions   Signed Prescriptions Disp Refills  . acetaminophen-codeine (TYLENOL #3) 300-30 MG tablet 20 tablet 0    Sig: Take 1 tablet by mouth every 12 (twelve) hours as needed. for pain

## 2018-07-11 ENCOUNTER — Other Ambulatory Visit (HOSPITAL_COMMUNITY): Payer: Self-pay

## 2018-07-11 DIAGNOSIS — F411 Generalized anxiety disorder: Secondary | ICD-10-CM

## 2018-07-11 MED ORDER — ALPRAZOLAM 0.5 MG PO TABS: 0.5000 mg | ORAL_TABLET | Freq: Two times a day (BID) | ORAL | 0 refills | Status: DC | PRN

## 2018-07-23 ENCOUNTER — Other Ambulatory Visit: Payer: Self-pay | Admitting: Orthopedic Surgery

## 2018-07-23 ENCOUNTER — Telehealth: Payer: Self-pay | Admitting: *Deleted

## 2018-07-23 ENCOUNTER — Ambulatory Visit (HOSPITAL_COMMUNITY): Payer: Medicare Other | Admitting: Licensed Clinical Social Worker

## 2018-07-23 ENCOUNTER — Encounter (HOSPITAL_COMMUNITY): Payer: Self-pay | Admitting: Licensed Clinical Social Worker

## 2018-07-23 DIAGNOSIS — F3162 Bipolar disorder, current episode mixed, moderate: Secondary | ICD-10-CM | POA: Diagnosis not present

## 2018-07-23 DIAGNOSIS — F4310 Post-traumatic stress disorder, unspecified: Secondary | ICD-10-CM

## 2018-07-23 NOTE — Progress Notes (Signed)
   THERAPIST PROGRESS NOTE  Session Time: 10:00am-11:00am  Participation Level: Active  Behavioral Response: Well GroomedAlertIrritable  Type of Therapy: Individual Therapy  Treatment Goals addressed: improve psychiatric symptoms, Controlled Behavior, Moderated Mood, Deliberative Speech (improved social functioning), Improve Unhelpful Thought Patterns, Emotional Regulation Skills (Moderate moods, anger management, stress management), Feel and express a full Range of Emotions, Learn about Diagnosis, Healthy Coping Skills, Recall the Traumatic event without being overwhelmed    Interventions: CBT, Motivational Interviewing, grounding techniques and mindfulness techniques, and psychoeducation  Summary: Justin [name] is a 72 y.o. male who presents with Bipolar 1 Disorder, Mixed, Moderate and PTSD  Suicidal/Homicidal: No - without intent/plan  Therapist Response:  Justin [name] met with clinician for an individual session. Justin [name] discussed his psychiatric symptoms, his current life events and his homework. Justin [name] reports that about 1 month ago, he fell while getting dressed and tore his ACL. He reports that following this injury, he has experienced a great deal of pain and aggravation, as he is unable to really get out and do much. He reported that prior to this injury, he was feeling better emotionally, but now is having a lot of irritability. Justin [name] continues to process his past, holding on to memories and guilt from 53 years ago. Clinician processed and encouraged him to keep talking about these incidents, which help clear his conscious and bring him more peace. Justin [name] reported he started thinking about his experience being drafted into the [employer] for [place] [employer]. He identified the belief that he should have gone and died on one of those battle fields. Clinician discussed the greater plan for Justin [name] and noted that had he died in [place], he would not have his children, his grandchildren, or his  relationship with his wife.    Plan: Return again in 1 -2 weeks.  Diagnosis:     Axis I: Bipolar 1 Disorder, Mixed, Moderate  and PTSD  [name], LCSW 07/23/2018

## 2018-07-23 NOTE — Telephone Encounter (Signed)
   [facility] Pre-operative Risk Assessment    Request for surgical clearance:  1. What type of surgery is being performed? LEFT TOTAL KNEE ARTHROPLASTY    2. When is this surgery scheduled? 08/31/18   3. Are there any medications that need to be held prior to surgery and how long? PLAVIX    4. Practice name and name of physician performing surgery? [facility]; DR. Jenny Reichmann GRA[place]   5. What is your office phone and fax number? PH# [phone]; FAX# [phone] ATT: [facility] SURGERY COORDINATOR.    6. Anesthesia type (None, local, MAC, general) SPINAL    [name] 07/23/2018, 12:42 PM  _________________________________________________________________   (provider comments below)

## 2018-07-24 NOTE — Telephone Encounter (Signed)
   Primary Cardiologist: ? Unclear - listed in Epic as Dr. Burt Knack but do not see he has seen Dr. Burt Knack. Patient was to establish on Scott's care team.  Chart reviewed as part of pre-operative protocol coverage. Will route to Richardson Dopp for input on holding Plavix as I'm not sure which MD this gets precepted with. Per note, h/o CAD - He underwent PCI with DES to the LAD and Dx in 2011 due to unstable angina and suffered a NSTEMI in 03/2011 with no culprit on catheterization (LAD and Dx stents were patent and there was mild to mod non-obstructive disease elsewhere).  Charlie Pitter, PA-C 07/24/2018, 5:15 PM

## 2018-07-25 NOTE — Telephone Encounter (Signed)
Ok to hold Plavix.  PCI was 8 years ago and NSTEMI tx medically was 7 years ago. Recent nuclear study without ischemia. Prefer he take ASA 81 mg QD while off of Plavix if surgeon ok with it. After surgery he can switch back to Plavix. Richardson Dopp, PA-C    07/25/2018 8:53 AM

## 2018-08-06 ENCOUNTER — Ambulatory Visit (HOSPITAL_COMMUNITY): Payer: Medicare Other | Admitting: Licensed Clinical Social Worker

## 2018-08-06 ENCOUNTER — Encounter (HOSPITAL_COMMUNITY): Payer: Self-pay | Admitting: Licensed Clinical Social Worker

## 2018-08-06 DIAGNOSIS — F3162 Bipolar disorder, current episode mixed, moderate: Secondary | ICD-10-CM

## 2018-08-06 NOTE — Progress Notes (Signed)
   THERAPIST PROGRESS NOTE  Session Time: 10:00am-10:50am Participation Level: Active  Behavioral Response: Well GroomedAlertDepressed   Type of Therapy: Individual Therapy  Treatment Goals addressed: improve psychiatric symptoms, Controlled Behavior, Moderated Mood, Deliberative Speech (improved social functioning), Improve Unhelpful Thought Patterns, Emotional Regulation Skills (Moderate moods, anger management, stress management), Feel and express a full Range of Emotions, Learn about Diagnosis, Healthy Coping Skills, Recall the Traumatic event without being overwhelmed    Interventions: CBT, Motivational Interviewing, grounding techniques and mindfulness techniques, and psychoeducation  Summary: Justin [name] is a 72 y.o. male who presents with Bipolar 1 Disorder, Mixed, Moderate and Dissociative Amnesia, Grief, and PTSD  Suicidal/Homicidal: No - without intent/plan  Therapist Response:  Justin [name] met with clinician for an individual session. Justin [name] discussed his psychiatric symptoms, his current life events and his homework. Justin [name] reports he spent some time considering journaling. In that time, he identified three main causes of his depression and inability to move on: guilt, shame, and anger toward himself. He reports he has not been feeling as depressed lately, as he is doing more active things with his grandchildren. Clinician encouraged Justin [name] to continue making the active decision not to dwell in the past and to be present in today. Clinician explored thoughts and feelings about his family, spending time over the holidays with them, and maintaining a positive mood. Justin [name] reports he continues to be concerned about getting a dog because he's "such a mess". However, clinician offered that the dog may be treatment for depression.   Plan: Return again in 1 -2 weeks.  Diagnosis:     Axis I: Bipolar 1 Disorder, Mixed, Moderate and Dissociative Amnesia, Grief, and PTSD  [name],  LCSW 08/06/2018

## 2018-08-09 ENCOUNTER — Telehealth (HOSPITAL_COMMUNITY): Payer: Self-pay

## 2018-08-09 DIAGNOSIS — F411 Generalized anxiety disorder: Secondary | ICD-10-CM

## 2018-08-09 MED ORDER — ALPRAZOLAM 0.5 MG PO TABS: 0.5000 mg | ORAL_TABLET | Freq: Two times a day (BID) | ORAL | 0 refills | Status: DC | PRN

## 2018-08-09 NOTE — Telephone Encounter (Signed)
Prescription called into [facility].

## 2018-08-09 NOTE — Telephone Encounter (Signed)
Patient called requesting a refill on Xanax. Next appointment is 09-2018   Please send to [facility]

## 2018-08-09 NOTE — Telephone Encounter (Signed)
Called and left voicemail message.

## 2018-08-13 NOTE — Patient Instructions (Addendum)
Justin [name]  08/13/2018   Your procedure is scheduled on: 08-31-18   Report to [facility] Main  Entrance    Report to Admitting at 5:30 AM    Call this number if you have problems the morning of surgery [phone]    Remember: Do not eat food or drink liquids :After Midnight.    BRUSH YOUR TEETH MORNING OF SURGERY AND RINSE YOUR MOUTH OUT, NO CHEWING GUM CANDY OR MINTS.     Take these medicines the morning of surgery with A SIP OF WATER: Diltiazem (Cartia), Lamotrigine (Lamictal), Rosuvastatin (Crestor, Aripiprazole (Abilify) and Alprazolam (Xanax), as needed.                                You may not have any metal on your body including hair pins and              piercings  Do not wear jewelry, colgone lotions, powders or deodorant             Men may shave face and neck.   Do not bring valuables to the hospital. [place].  Contacts, dentures or bridgework may not be worn into surgery.  Leave suitcase in the car. After surgery it may be brought to your room.      Special Instructions: N/A              Please read over the following fact sheets you were given: _____________________________________________________________________             [facility] - Preparing for Surgery Before surgery, you can play an important role.  Because skin is not sterile, your skin needs to be as free of germs as possible.  You can reduce the number of germs on your skin by washing with CHG (chlorahexidine gluconate) soap before surgery.  CHG is an antiseptic cleaner which kills germs and bonds with the skin to continue killing germs even after washing. Please DO NOT use if you have an allergy to CHG or antibacterial soaps.  If your skin becomes reddened/irritated stop using the CHG and inform your nurse when you arrive at Short Stay. Do not shave (including legs and underarms) for at least 48 hours prior to the  first CHG shower.  You may shave your face/neck. Please follow these instructions carefully:  1.  Shower with CHG Soap the night before surgery and the  morning of Surgery.  2.  If you choose to wash your hair, wash your hair first as usual with your  normal  shampoo.  3.  After you shampoo, rinse your hair and body thoroughly to remove the  shampoo.                           4.  Use CHG as you would any other liquid soap.  You can apply chg directly  to the skin and wash                       Gently with a scrungie or clean washcloth.  5.  Apply the CHG Soap to your body ONLY FROM THE NECK DOWN.   Do not use on  face/ open                           Wound or open sores. Avoid contact with eyes, ears mouth and genitals (private parts).                       Wash face,  Genitals (private parts) with your normal soap.             6.  Wash thoroughly, paying special attention to the area where your surgery  will be performed.  7.  Thoroughly rinse your body with warm water from the neck down.  8.  DO NOT shower/wash with your normal soap after using and rinsing off  the CHG Soap.                9.  Pat yourself dry with a clean towel.            10.  Wear clean pajamas.            11.  Place clean sheets on your bed the night of your first shower and do not  sleep with pets. Day of Surgery : Do not apply any lotions/deodorants the morning of surgery.  Please wear clean clothes to the hospital/surgery center.  FAILURE TO FOLLOW THESE INSTRUCTIONS MAY RESULT IN THE CANCELLATION OF YOUR SURGERY PATIENT SIGNATURE_________________________________  NURSE SIGNATURE__________________________________  ________________________________________________________________________   Justin [name]  An incentive spirometer is a tool that can help keep your lungs clear and active. This tool measures how well you are filling your lungs with each breath. Taking long deep breaths may help reverse or decrease  the chance of developing breathing (pulmonary) problems (especially infection) following:  A long period of time when you are unable to move or be active. BEFORE THE PROCEDURE   If the spirometer includes an indicator to show your best effort, your nurse or respiratory therapist will set it to a desired goal.  If possible, sit up straight or lean slightly forward. Try not to slouch.  Hold the incentive spirometer in an upright position. INSTRUCTIONS FOR USE  1. Sit on the edge of your bed if possible, or sit up as far as you can in bed or on a chair. 2. Hold the incentive spirometer in an upright position. 3. Breathe out normally. 4. Place the mouthpiece in your mouth and seal your lips tightly around it. 5. Breathe in slowly and as deeply as possible, raising the piston or the ball toward the top of the column. 6. Hold your breath for 3-5 seconds or for as long as possible. Allow the piston or ball to fall to the bottom of the column. 7. Remove the mouthpiece from your mouth and breathe out normally. 8. Rest for a few seconds and repeat Steps 1 through 7 at least 10 times every 1-2 hours when you are awake. Take your time and take a few normal breaths between deep breaths. 9. The spirometer may include an indicator to show your best effort. Use the indicator as a goal to work toward during each repetition. 10. After each set of 10 deep breaths, practice coughing to be sure your lungs are clear. If you have an incision (the cut made at the time of surgery), support your incision when coughing by placing a pillow or rolled up towels firmly against it. Once you are able to get out of bed, walk around indoors  and cough well. You may stop using the incentive spirometer when instructed by your caregiver.  RISKS AND COMPLICATIONS  Take your time so you do not get dizzy or light-headed.  If you are in pain, you may need to take or ask for pain medication before doing incentive spirometry. It is  harder to take a deep breath if you are having pain. AFTER USE  Rest and breathe slowly and easily.  It can be helpful to keep track of a log of your progress. Your caregiver can provide you with a simple table to help with this. If you are using the spirometer at home, follow these instructions: [place] IF:   You are having difficultly using the spirometer.  You have trouble using the spirometer as often as instructed.  Your pain medication is not giving enough relief while using the spirometer.  You develop fever of 100.5 F (38.1 C) or higher. SEEK IMMEDIATE MEDICAL CARE IF:   You cough up bloody sputum that had not been present before.  You develop fever of 102 F (38.9 C) or greater.  You develop worsening pain at or near the incision site. MAKE SURE YOU:   Understand these instructions.  Will watch your condition.  Will get help right away if you are not doing well or get worse. Document Released: 12/19/2006 Document Revised: 10/31/2011 Document Reviewed: 02/19/2007 ExitCare Patient Information 2014 ExitCare, [place].   ________________________________________________________________________  WHAT IS A BLOOD TRANSFUSION? Blood Transfusion Information  A transfusion is the replacement of blood or some of its parts. Blood is made up of multiple cells which provide different functions.  Red blood cells carry oxygen and are used for blood loss replacement.  White blood cells fight against infection.  Platelets control bleeding.  Plasma helps clot blood.  Other blood products are available for specialized needs, such as hemophilia or other clotting disorders. BEFORE THE TRANSFUSION  Who gives blood for transfusions?   Healthy volunteers who are fully evaluated to make sure their blood is safe. This is blood bank blood. Transfusion therapy is the safest it has ever been in the practice of medicine. Before blood is taken from a donor, a complete history  is taken to make sure that person has no history of diseases nor engages in risky social behavior (examples are intravenous drug use or sexual activity with multiple partners). The donor's travel history is screened to minimize risk of transmitting infections, such as malaria. The donated blood is tested for signs of infectious diseases, such as HIV and hepatitis. The blood is then tested to be sure it is compatible with you in order to minimize the chance of a transfusion reaction. If you or a relative donates blood, this is often done in anticipation of surgery and is not appropriate for emergency situations. It takes many days to process the donated blood. RISKS AND COMPLICATIONS Although transfusion therapy is very safe and saves many lives, the main dangers of transfusion include:   Getting an infectious disease.  Developing a transfusion reaction. This is an allergic reaction to something in the blood you were given. Every precaution is taken to prevent this. The decision to have a blood transfusion has been considered carefully by your caregiver before blood is given. Blood is not given unless the benefits outweigh the risks. AFTER THE TRANSFUSION  Right after receiving a blood transfusion, you will usually feel much better and more energetic. This is especially true if your red blood cells have gotten low (  anemic). The transfusion raises the level of the red blood cells which carry oxygen, and this usually causes an energy increase.  The nurse administering the transfusion will monitor you carefully for complications. HOME CARE INSTRUCTIONS  No special instructions are needed after a transfusion. You may find your energy is better. Speak with your caregiver about any limitations on activity for underlying diseases you may have. SEEK MEDICAL CARE IF:   Your condition is not improving after your transfusion.  You develop redness or irritation at the intravenous (IV) site. SEEK IMMEDIATE  MEDICAL CARE IF:  Any of the following symptoms occur over the next 12 hours:  Shaking chills.  You have a temperature by mouth above 102 F (38.9 C), not controlled by medicine.  Chest, back, or muscle pain.  People around you feel you are not acting correctly or are confused.  Shortness of breath or difficulty breathing.  Dizziness and fainting.  You get a rash or develop hives.  You have a decrease in urine output.  Your urine turns a dark color or changes to pink, red, or brown. Any of the following symptoms occur over the next 10 days:  You have a temperature by mouth above 102 F (38.9 C), not controlled by medicine.  Shortness of breath.  Weakness after normal activity.  The white part of the eye turns yellow (jaundice).  You have a decrease in the amount of urine or are urinating less often.  Your urine turns a dark color or changes to pink, red, or brown. Document Released: 08/05/2000 Document Revised: 10/31/2011 Document Reviewed: 03/24/2008 [facility] Patient Information 2014 [place], [place].  _______________________________________________________________________

## 2018-08-13 NOTE — Progress Notes (Signed)
07-23-18 (Epic) Cardiac Clearance Richardson Dopp, PAC  05-01-18 (Epic) NSR  11-17-17 (Epic) Stress and ECHO

## 2018-08-20 ENCOUNTER — Ambulatory Visit (INDEPENDENT_AMBULATORY_CARE_PROVIDER_SITE_OTHER): Payer: Medicare Other | Admitting: Licensed Clinical Social Worker

## 2018-08-20 ENCOUNTER — Encounter (HOSPITAL_COMMUNITY): Payer: Self-pay | Admitting: Licensed Clinical Social Worker

## 2018-08-20 DIAGNOSIS — F4310 Post-traumatic stress disorder, unspecified: Secondary | ICD-10-CM

## 2018-08-20 DIAGNOSIS — F3162 Bipolar disorder, current episode mixed, moderate: Secondary | ICD-10-CM | POA: Diagnosis not present

## 2018-08-20 NOTE — Progress Notes (Signed)
   THERAPIST PROGRESS NOTE  Session Time: 1:30pm-2:30pm  Participation Level: Active  Behavioral Response: Well GroomedAlertEuthymic   Type of Therapy: Individual Therapy  Treatment Goals addressed: improve psychiatric symptoms, Controlled Behavior, Moderated Mood, Deliberative Speech (improved social functioning), Improve Unhelpful Thought Patterns, Emotional Regulation Skills (Moderate moods, anger management, stress management), Feel and express a full Range of Emotions, Learn about Diagnosis, Healthy Coping Skills, Recall the Traumatic event without being overwhelmed    Interventions: CBT, Motivational Interviewing, grounding techniques and mindfulness techniques, and psychoeducation  Summary: Fed Ceci is a 72 y.o. male who presents with Bipolar 1 Disorder, Mixed, Moderate and PTSD  Suicidal/Homicidal: No - without intent/plan  Therapist Response:  Dior met with clinician for an individual session. Giuseppe discussed his psychiatric symptoms, his current life events and his homework. [name] reported that he had a very active week with the Christmas holiday. He processed his experiences of being more involved in family life with his son and grandchildren. He also noted that he said yes more to family events and friend's get-togethers. Clinician explored impact of being more social and noted that there was more comfort in being with people than in the past. He also identified that the more involved he was in meeting people, the more comfortable he felt. Clinician reflected the value of practicing social skills and interactions in order to help them feel more natural. [name] reported feeling better overall, with only a few quiet moments of thinking about his wife. He also reported that this was fleeting and less distressing than in the past. He identified the changes in himself over the past year and noted that he continues to learn and grown, challenging unhelpful thought patterns.  Clinician provided CBT psychoeducation about the development of core beliefs and began processing early childhood trauma.   Plan: Return again in 1 -2 weeks.  Diagnosis:     Axis I: Bipolar 1 Disorder, Mixed, Moderate and PTSD  [name], LCSW 08/20/2018

## 2018-08-21 ENCOUNTER — Encounter (HOSPITAL_COMMUNITY)
Admission: RE | Admit: 2018-08-21 | Discharge: 2018-08-21 | Disposition: A | Discharge status: Home / Self Care | Payer: Medicare Other | Source: Ambulatory Visit | Attending: Orthopedic Surgery | Admitting: Orthopedic Surgery

## 2018-08-21 ENCOUNTER — Other Ambulatory Visit: Payer: Self-pay

## 2018-08-21 ENCOUNTER — Encounter (HOSPITAL_COMMUNITY): Payer: Self-pay

## 2018-08-21 DIAGNOSIS — Z01812 Encounter for preprocedural laboratory examination: Secondary | ICD-10-CM | POA: Insufficient documentation

## 2018-08-21 LAB — CBC WITH DIFFERENTIAL/PLATELET
Abs Immature Granulocytes: 0.03 10*3/uL (ref 0.00–0.07)
Basophils Absolute: 0 10*3/uL (ref 0.0–0.1)
Basophils Relative: 1 %
EOS PCT: 1 %
Eosinophils Absolute: 0 10*3/uL (ref 0.0–0.5)
HCT: 44.8 % (ref 39.0–52.0)
Hemoglobin: 14.6 g/dL (ref 13.0–17.0)
Immature Granulocytes: 1 %
Lymphocytes Relative: 17 %
Lymphs Abs: 0.9 10*3/uL (ref 0.7–4.0)
MCH: 32.7 pg (ref 26.0–34.0)
MCHC: 32.6 g/dL (ref 30.0–36.0)
MCV: 100.2 fL — ABNORMAL HIGH (ref 80.0–100.0)
MONO ABS: 0.5 10*3/uL (ref 0.1–1.0)
Monocytes Relative: 9 %
Neutro Abs: 3.9 10*3/uL (ref 1.7–7.7)
Neutrophils Relative %: 71 %
Platelets: 221 10*3/uL (ref 150–400)
RBC: 4.47 MIL/uL (ref 4.22–5.81)
RDW: 12.5 % (ref 11.5–15.5)
WBC: 5.4 10*3/uL (ref 4.0–10.5)
nRBC: 0 % (ref 0.0–0.2)

## 2018-08-21 LAB — URINALYSIS, ROUTINE W REFLEX MICROSCOPIC
Bilirubin Urine: NEGATIVE
Glucose, UA: NEGATIVE mg/dL
Hgb urine dipstick: NEGATIVE
Ketones, ur: 20 mg/dL — AB
Leukocytes, UA: NEGATIVE
Nitrite: NEGATIVE
PH: 6 (ref 5.0–8.0)
Protein, ur: NEGATIVE mg/dL
SPECIFIC GRAVITY, URINE: 1.014 (ref 1.005–1.030)

## 2018-08-21 LAB — COMPREHENSIVE METABOLIC PANEL
ALT: 19 U/L (ref 0–44)
ANION GAP: 10 (ref 5–15)
AST: 31 U/L (ref 15–41)
Albumin: 4.8 g/dL (ref 3.5–5.0)
Alkaline Phosphatase: 52 U/L (ref 38–126)
BUN: 13 mg/dL (ref 8–23)
CO2: 25 mmol/L (ref 22–32)
Calcium: 9.4 mg/dL (ref 8.9–10.3)
Chloride: 102 mmol/L (ref 98–111)
Creatinine, Ser: 0.93 mg/dL (ref 0.61–1.24)
GFR calc Af Amer: >60 mL/min (ref >60)
GFR calc non Af Amer: >60 mL/min (ref >60)
Glucose, Bld: 96 mg/dL (ref 70–99)
Potassium: 3.9 mmol/L (ref 3.5–5.1)
SODIUM: 137 mmol/L (ref 135–145)
Total Bilirubin: 1.7 mg/dL — ABNORMAL HIGH (ref 0.3–1.2)
Total Protein: 7.2 g/dL (ref 6.5–8.1)

## 2018-08-21 LAB — SURGICAL PCR SCREEN
MRSA, PCR: NEGATIVE
Staphylococcus aureus: NEGATIVE

## 2018-08-21 LAB — PROTIME-INR
INR: 0.95
Prothrombin Time: 12.6 seconds (ref 11.4–15.2)

## 2018-08-21 LAB — APTT: aPTT: 31 seconds (ref 24–36)

## 2018-08-21 NOTE — Progress Notes (Signed)
08-21-18 Pt to contact Richardson Dopp, PAC to determine when he should take his last dose of Plavix.

## 2018-08-23 ENCOUNTER — Telehealth: Payer: Self-pay | Admitting: Physician Assistant

## 2018-08-23 NOTE — Telephone Encounter (Signed)
Returned pt call to make him aware that okay per Richardson Dopp, PA-C note on 12/4 to hold Plavix and to take ASA 81 mg while off of Plavsix if okay with surgeon, then switch back to Plavix when surgery is complete. Pt verbalized understanding and thanked me for the call.

## 2018-08-23 NOTE — Telephone Encounter (Signed)
New Message    Pt c/o medication issue:  1. Name of Medication: Plavix  2. How are you currently taking this medication (dosage and times per day)? 75mg  once per day   3. Are you having a reaction (difficulty breathing--STAT)? No  4. What is your medication issue? Patient having surgery 1/10 and needs to know when he should stop taking the medication before he has the surgery.

## 2018-08-30 MED ORDER — BUPIVACAINE LIPOSOME 1.3 % IJ SUSP
20.0000 mL | Freq: Once | INTRAMUSCULAR | Status: DC
  Filled 2018-08-30: qty 20

## 2018-08-30 NOTE — Anesthesia Preprocedure Evaluation (Addendum)
Anesthesia Evaluation  Patient identified by MRN, date of birth, ID band Patient awake    Reviewed: Allergy & Precautions, NPO status , Patient's Chart, lab work & pertinent test results  Airway Mallampati: II  TM Distance: >3 FB Neck ROM: Full    Dental no notable dental hx. (+) Poor Dentition,    Pulmonary neg pulmonary ROS,    Pulmonary exam normal breath sounds clear to auscultation       Cardiovascular hypertension, Pt. on medications + CAD  Normal cardiovascular exam Rhythm:Regular Rate:Normal  Echo 11/07/17  Left ventricle: The cavity size was normal. There was mild   concentric hypertrophy. Systolic function was normal. The   estimated ejection fraction was in the range of 60% to 65%. Wall   motion was normal; there were no regional wall motion   abnormalities. There was an increased relative contribution of   atrial contraction to ventricular filling. Doppler parameters are   consistent with abnormal left ventricular relaxation (grade 1   diastolic dysfunction).  Ekg 05/10/18 NSR   Neuro/Psych  Headaches, PSYCHIATRIC DISORDERS Bipolar Disorder Hx of PTSD   GI/Hepatic negative GI ROS, Neg liver ROS,   Endo/Other  negative endocrine ROS  Renal/GU negative Renal ROS     Musculoskeletal  (+) Arthritis ,   Abdominal   Peds  Hematology  (+) Blood dyscrasia, anemia ,   Anesthesia Other Findings   Reproductive/Obstetrics                            Lab Results  Component Value Date   WBC 5.4 08/21/2018   HGB 14.6 08/21/2018   HCT 44.8 08/21/2018   MCV 100.2 (H) 08/21/2018   PLT 221 08/21/2018   Lab Results  Component Value Date   CREATININE 0.93 08/21/2018   BUN 13 08/21/2018   NA 137 08/21/2018   K 3.9 08/21/2018   CL 102 08/21/2018   CO2 25 08/21/2018    Anesthesia Physical Anesthesia Plan  ASA: III  Anesthesia Plan: Spinal   Post-op Pain Management:  Regional for  Post-op pain   Induction:   PONV Risk Score and Plan: Treatment may vary due to age or medical condition and Ondansetron  Airway Management Planned: Natural Airway and Nasal Cannula  Additional Equipment:   Intra-op Plan:   Post-operative Plan: Extubation in OR  Informed Consent: I have reviewed the patients History and Physical, chart, labs and discussed the procedure including the risks, benefits and alternatives for the proposed anesthesia with the patient or authorized representative who has indicated his/her understanding and acceptance.   Dental advisory given  Plan Discussed with: CRNA  Anesthesia Plan Comments: (Spinal if off Plavix for 5 days plus adductor canal block)       Anesthesia Quick Evaluation

## 2018-08-31 ENCOUNTER — Other Ambulatory Visit: Payer: Self-pay

## 2018-08-31 ENCOUNTER — Ambulatory Visit (HOSPITAL_COMMUNITY)
Admission: RE | Admit: 2018-08-31 | Discharge: 2018-09-03 | Disposition: A | Discharge status: Home / Self Care | Payer: Medicare Other | Attending: Orthopedic Surgery | Admitting: Orthopedic Surgery

## 2018-08-31 ENCOUNTER — Ambulatory Visit (HOSPITAL_COMMUNITY): Payer: Medicare Other | Admitting: Certified Registered"

## 2018-08-31 ENCOUNTER — Encounter (HOSPITAL_COMMUNITY)
Admission: RE | Disposition: A | Discharge status: Home / Self Care | Payer: Self-pay | Source: Home / Self Care | Attending: Orthopedic Surgery

## 2018-08-31 ENCOUNTER — Encounter (HOSPITAL_COMMUNITY): Payer: Self-pay | Admitting: *Deleted

## 2018-08-31 DIAGNOSIS — Z888 Allergy status to other drugs, medicaments and biological substances status: Secondary | ICD-10-CM | POA: Insufficient documentation

## 2018-08-31 DIAGNOSIS — Z8249 Family history of ischemic heart disease and other diseases of the circulatory system: Secondary | ICD-10-CM | POA: Diagnosis not present

## 2018-08-31 DIAGNOSIS — F4310 Post-traumatic stress disorder, unspecified: Secondary | ICD-10-CM | POA: Diagnosis not present

## 2018-08-31 DIAGNOSIS — Z8261 Family history of arthritis: Secondary | ICD-10-CM | POA: Insufficient documentation

## 2018-08-31 DIAGNOSIS — Z86010 Personal history of colonic polyps: Secondary | ICD-10-CM | POA: Insufficient documentation

## 2018-08-31 DIAGNOSIS — Z820 Family history of epilepsy and other diseases of the nervous system: Secondary | ICD-10-CM | POA: Diagnosis not present

## 2018-08-31 DIAGNOSIS — Z818 Family history of other mental and behavioral disorders: Secondary | ICD-10-CM | POA: Diagnosis not present

## 2018-08-31 DIAGNOSIS — I252 Old myocardial infarction: Secondary | ICD-10-CM | POA: Insufficient documentation

## 2018-08-31 DIAGNOSIS — D62 Acute posthemorrhagic anemia: Secondary | ICD-10-CM | POA: Diagnosis not present

## 2018-08-31 DIAGNOSIS — F419 Anxiety disorder, unspecified: Secondary | ICD-10-CM | POA: Diagnosis not present

## 2018-08-31 DIAGNOSIS — Z833 Family history of diabetes mellitus: Secondary | ICD-10-CM | POA: Diagnosis not present

## 2018-08-31 DIAGNOSIS — Z79899 Other long term (current) drug therapy: Secondary | ICD-10-CM | POA: Insufficient documentation

## 2018-08-31 DIAGNOSIS — F314 Bipolar disorder, current episode depressed, severe, without psychotic features: Secondary | ICD-10-CM | POA: Diagnosis not present

## 2018-08-31 DIAGNOSIS — Z955 Presence of coronary angioplasty implant and graft: Secondary | ICD-10-CM | POA: Insufficient documentation

## 2018-08-31 DIAGNOSIS — I2510 Atherosclerotic heart disease of native coronary artery without angina pectoris: Secondary | ICD-10-CM | POA: Diagnosis not present

## 2018-08-31 DIAGNOSIS — F440 Dissociative amnesia: Secondary | ICD-10-CM | POA: Insufficient documentation

## 2018-08-31 DIAGNOSIS — I10 Essential (primary) hypertension: Secondary | ICD-10-CM | POA: Insufficient documentation

## 2018-08-31 DIAGNOSIS — E785 Hyperlipidemia, unspecified: Secondary | ICD-10-CM | POA: Diagnosis not present

## 2018-08-31 DIAGNOSIS — Z823 Family history of stroke: Secondary | ICD-10-CM | POA: Diagnosis not present

## 2018-08-31 DIAGNOSIS — M1712 Unilateral primary osteoarthritis, left knee: Secondary | ICD-10-CM

## 2018-08-31 DIAGNOSIS — Z801 Family history of malignant neoplasm of trachea, bronchus and lung: Secondary | ICD-10-CM | POA: Insufficient documentation

## 2018-08-31 DIAGNOSIS — Z7902 Long term (current) use of antithrombotics/antiplatelets: Secondary | ICD-10-CM | POA: Insufficient documentation

## 2018-08-31 HISTORY — PX: TOTAL KNEE ARTHROPLASTY: SHX125

## 2018-08-31 SURGERY — ARTHROPLASTY, KNEE, TOTAL
Anesthesia: Spinal | Site: Knee | Laterality: Left

## 2018-08-31 MED ORDER — CEFAZOLIN SODIUM-DEXTROSE 2-4 GM/100ML-% IV SOLN
2.0000 g | INTRAVENOUS | Status: AC
  Administered 2018-08-31: 2 g via INTRAVENOUS
  Filled 2018-08-31: qty 100

## 2018-08-31 MED ORDER — POLYETHYLENE GLYCOL 3350 17 G PO PACK
17.0000 g | PACK | Freq: Every day | ORAL | Status: DC | PRN
  Administered 2018-09-01: 17 g via ORAL
  Filled 2018-08-31: qty 1

## 2018-08-31 MED ORDER — BUPIVACAINE LIPOSOME 1.3 % IJ SUSP
INTRAMUSCULAR | Status: DC | PRN
  Administered 2018-08-31: 20 mL

## 2018-08-31 MED ORDER — ONDANSETRON HCL 4 MG/2ML IJ SOLN: 4.0000 mg | Freq: Once | INTRAMUSCULAR | Status: DC | PRN

## 2018-08-31 MED ORDER — BISACODYL 5 MG PO TBEC: 5.0000 mg | DELAYED_RELEASE_TABLET | Freq: Every day | ORAL | Status: DC | PRN

## 2018-08-31 MED ORDER — DOXEPIN HCL 25 MG PO CAPS
25.0000 mg | ORAL_CAPSULE | Freq: Every evening | ORAL | Status: DC | PRN
  Filled 2018-08-31: qty 1

## 2018-08-31 MED ORDER — ONDANSETRON HCL 4 MG PO TABS: 4.0000 mg | ORAL_TABLET | Freq: Four times a day (QID) | ORAL | Status: DC | PRN

## 2018-08-31 MED ORDER — DILTIAZEM HCL ER COATED BEADS 180 MG PO CP24
180.0000 mg | ORAL_CAPSULE | Freq: Every day | ORAL | Status: DC
  Administered 2018-09-01 – 2018-09-03 (×2): 180 mg via ORAL
  Filled 2018-08-31 (×3): qty 1

## 2018-08-31 MED ORDER — CEFAZOLIN SODIUM-DEXTROSE 2-4 GM/100ML-% IV SOLN
2.0000 g | Freq: Four times a day (QID) | INTRAVENOUS | Status: AC
  Administered 2018-08-31 (×2): 2 g via INTRAVENOUS
  Filled 2018-08-31 (×2): qty 100

## 2018-08-31 MED ORDER — ACETAMINOPHEN 325 MG PO TABS
325.0000 mg | ORAL_TABLET | Freq: Four times a day (QID) | ORAL | Status: DC | PRN
  Administered 2018-08-31 – 2018-09-01 (×2): 650 mg via ORAL
  Filled 2018-08-31 (×2): qty 2

## 2018-08-31 MED ORDER — ALUM & MAG HYDROXIDE-SIMETH 200-200-20 MG/5ML PO SUSP: 30.0000 mL | ORAL | Status: DC | PRN

## 2018-08-31 MED ORDER — DEXAMETHASONE SODIUM PHOSPHATE 10 MG/ML IJ SOLN
10.0000 mg | Freq: Two times a day (BID) | INTRAMUSCULAR | Status: AC
  Administered 2018-08-31 – 2018-09-01 (×3): 10 mg via INTRAVENOUS
  Filled 2018-08-31 (×3): qty 1

## 2018-08-31 MED ORDER — BUPIVACAINE-EPINEPHRINE (PF) 0.25% -1:200000 IJ SOLN
INTRAMUSCULAR | Status: AC
  Filled 2018-08-31: qty 30

## 2018-08-31 MED ORDER — SODIUM CHLORIDE 0.9 % IR SOLN
Status: DC | PRN
  Administered 2018-08-31: 1000 mL

## 2018-08-31 MED ORDER — DIPHENHYDRAMINE HCL 12.5 MG/5ML PO ELIX: 12.5000 mg | ORAL_SOLUTION | ORAL | Status: DC | PRN

## 2018-08-31 MED ORDER — WATER FOR IRRIGATION, STERILE IR SOLN
Status: DC | PRN
  Administered 2018-08-31: 2000 mL

## 2018-08-31 MED ORDER — DEXAMETHASONE SODIUM PHOSPHATE 10 MG/ML IJ SOLN
INTRAMUSCULAR | Status: DC | PRN
  Administered 2018-08-31: 8 mg via INTRAVENOUS

## 2018-08-31 MED ORDER — ALPRAZOLAM 0.5 MG PO TABS: 0.5000 mg | ORAL_TABLET | Freq: Two times a day (BID) | ORAL | Status: DC | PRN

## 2018-08-31 MED ORDER — HYDROMORPHONE HCL 1 MG/ML IJ SOLN
0.5000 mg | INTRAMUSCULAR | Status: DC | PRN
  Administered 2018-09-01: 0.5 mg via INTRAVENOUS
  Administered 2018-09-02: 1 mg via INTRAVENOUS
  Filled 2018-08-31 (×2): qty 1

## 2018-08-31 MED ORDER — BUPIVACAINE-EPINEPHRINE 0.5% -1:200000 IJ SOLN
INTRAMUSCULAR | Status: DC | PRN
  Administered 2018-08-31: 30 mL

## 2018-08-31 MED ORDER — DEXAMETHASONE SODIUM PHOSPHATE 10 MG/ML IJ SOLN
INTRAMUSCULAR | Status: AC
  Filled 2018-08-31: qty 1

## 2018-08-31 MED ORDER — ROPIVACAINE HCL 5 MG/ML IJ SOLN
INTRAMUSCULAR | Status: DC | PRN
  Administered 2018-08-31: 30 mL via PERINEURAL

## 2018-08-31 MED ORDER — MIDAZOLAM HCL 2 MG/2ML IJ SOLN
INTRAMUSCULAR | Status: DC | PRN
  Administered 2018-08-31 (×2): 1 mg via INTRAVENOUS

## 2018-08-31 MED ORDER — TIZANIDINE HCL 2 MG PO TABS: 2.0000 mg | ORAL_TABLET | Freq: Three times a day (TID) | ORAL | 0 refills | Status: DC | PRN

## 2018-08-31 MED ORDER — PROPOFOL 10 MG/ML IV BOLUS
INTRAVENOUS | Status: AC
  Filled 2018-08-31: qty 80

## 2018-08-31 MED ORDER — LACTATED RINGERS IV SOLN
INTRAVENOUS | Status: DC
  Administered 2018-08-31: 06:00:00 via INTRAVENOUS

## 2018-08-31 MED ORDER — KETOROLAC TROMETHAMINE 30 MG/ML IJ SOLN
INTRAMUSCULAR | Status: AC
  Administered 2018-08-31: 30 mg
  Filled 2018-08-31: qty 1

## 2018-08-31 MED ORDER — GABAPENTIN 300 MG PO CAPS
300.0000 mg | ORAL_CAPSULE | Freq: Two times a day (BID) | ORAL | Status: DC
  Administered 2018-08-31 – 2018-09-03 (×7): 300 mg via ORAL
  Filled 2018-08-31 (×7): qty 1

## 2018-08-31 MED ORDER — METHOCARBAMOL 500 MG PO TABS
500.0000 mg | ORAL_TABLET | Freq: Four times a day (QID) | ORAL | Status: DC | PRN
  Administered 2018-08-31 – 2018-09-03 (×5): 500 mg via ORAL
  Filled 2018-08-31 (×6): qty 1

## 2018-08-31 MED ORDER — OXYCODONE HCL 5 MG PO TABS
5.0000 mg | ORAL_TABLET | ORAL | Status: DC | PRN
  Administered 2018-08-31: 10 mg via ORAL
  Administered 2018-08-31: 5 mg via ORAL
  Administered 2018-08-31 – 2018-09-03 (×13): 10 mg via ORAL
  Filled 2018-08-31 (×9): qty 2
  Filled 2018-08-31: qty 1
  Filled 2018-08-31 (×6): qty 2

## 2018-08-31 MED ORDER — SODIUM CHLORIDE (PF) 0.9 % IJ SOLN
INTRAMUSCULAR | Status: AC
  Filled 2018-08-31: qty 50

## 2018-08-31 MED ORDER — ONDANSETRON HCL 4 MG/2ML IJ SOLN
INTRAMUSCULAR | Status: AC
  Filled 2018-08-31: qty 2

## 2018-08-31 MED ORDER — METHOCARBAMOL 500 MG IVPB - SIMPLE MED
INTRAVENOUS | Status: AC
  Filled 2018-08-31: qty 50

## 2018-08-31 MED ORDER — FENTANYL CITRATE (PF) 100 MCG/2ML IJ SOLN
INTRAMUSCULAR | Status: AC
  Filled 2018-08-31: qty 4

## 2018-08-31 MED ORDER — FENTANYL CITRATE (PF) 100 MCG/2ML IJ SOLN
INTRAMUSCULAR | Status: AC
  Filled 2018-08-31: qty 2

## 2018-08-31 MED ORDER — FENTANYL CITRATE (PF) 250 MCG/5ML IJ SOLN
INTRAMUSCULAR | Status: DC | PRN
  Administered 2018-08-31 (×2): 50 ug via INTRAVENOUS

## 2018-08-31 MED ORDER — ACETAMINOPHEN 10 MG/ML IV SOLN
1000.0000 mg | Freq: Once | INTRAVENOUS | Status: DC | PRN
  Administered 2018-08-31: 1000 mg via INTRAVENOUS

## 2018-08-31 MED ORDER — OXYCODONE-ACETAMINOPHEN 5-325 MG PO TABS: 1.0000 | ORAL_TABLET | Freq: Four times a day (QID) | ORAL | 0 refills | Status: DC | PRN

## 2018-08-31 MED ORDER — BUPIVACAINE IN DEXTROSE 0.75-8.25 % IT SOLN
INTRATHECAL | Status: DC | PRN
  Administered 2018-08-31: 1.8 mL via INTRATHECAL

## 2018-08-31 MED ORDER — DOCUSATE SODIUM 100 MG PO CAPS: 100.0000 mg | ORAL_CAPSULE | Freq: Two times a day (BID) | ORAL | 0 refills | Status: DC

## 2018-08-31 MED ORDER — CELECOXIB 200 MG PO CAPS
200.0000 mg | ORAL_CAPSULE | Freq: Two times a day (BID) | ORAL | Status: DC
  Administered 2018-08-31 – 2018-09-03 (×6): 200 mg via ORAL
  Filled 2018-08-31 (×6): qty 1

## 2018-08-31 MED ORDER — LAMOTRIGINE 25 MG PO TABS
150.0000 mg | ORAL_TABLET | Freq: Every day | ORAL | Status: DC
  Administered 2018-09-01 – 2018-09-03 (×3): 150 mg via ORAL
  Filled 2018-08-31 (×3): qty 1

## 2018-08-31 MED ORDER — TRANEXAMIC ACID-NACL 1000-0.7 MG/100ML-% IV SOLN
1000.0000 mg | INTRAVENOUS | Status: AC
  Administered 2018-08-31: 1000 mg via INTRAVENOUS
  Filled 2018-08-31: qty 100

## 2018-08-31 MED ORDER — CHLORHEXIDINE GLUCONATE 4 % EX LIQD: 60.0000 mL | Freq: Once | CUTANEOUS | Status: DC

## 2018-08-31 MED ORDER — ACETAMINOPHEN 10 MG/ML IV SOLN
INTRAVENOUS | Status: AC
  Filled 2018-08-31: qty 100

## 2018-08-31 MED ORDER — HYDROMORPHONE HCL 1 MG/ML IJ SOLN
INTRAMUSCULAR | Status: AC
  Filled 2018-08-31: qty 2

## 2018-08-31 MED ORDER — CLOPIDOGREL BISULFATE 75 MG PO TABS
75.0000 mg | ORAL_TABLET | Freq: Every day | ORAL | Status: DC
  Administered 2018-09-01 – 2018-09-03 (×3): 75 mg via ORAL
  Filled 2018-08-31 (×3): qty 1

## 2018-08-31 MED ORDER — KETOROLAC TROMETHAMINE 30 MG/ML IJ SOLN
30.0000 mg | Freq: Once | INTRAMUSCULAR | Status: AC
  Administered 2018-08-31: 30 mg via INTRAVENOUS

## 2018-08-31 MED ORDER — SODIUM CHLORIDE 0.9% FLUSH
INTRAVENOUS | Status: DC | PRN
  Administered 2018-08-31: 50 mL

## 2018-08-31 MED ORDER — DOCUSATE SODIUM 100 MG PO CAPS
100.0000 mg | ORAL_CAPSULE | Freq: Two times a day (BID) | ORAL | Status: DC
  Administered 2018-08-31 – 2018-09-03 (×6): 100 mg via ORAL
  Filled 2018-08-31 (×6): qty 1

## 2018-08-31 MED ORDER — FENTANYL CITRATE (PF) 100 MCG/2ML IJ SOLN
25.0000 ug | INTRAMUSCULAR | Status: DC | PRN
  Administered 2018-08-31 (×3): 50 ug via INTRAVENOUS

## 2018-08-31 MED ORDER — SODIUM CHLORIDE 0.9 % IV SOLN
INTRAVENOUS | Status: DC
  Administered 2018-08-31 (×2): via INTRAVENOUS

## 2018-08-31 MED ORDER — ONDANSETRON HCL 4 MG/2ML IJ SOLN
INTRAMUSCULAR | Status: DC | PRN
  Administered 2018-08-31: 4 mg via INTRAVENOUS

## 2018-08-31 MED ORDER — TRANEXAMIC ACID-NACL 1000-0.7 MG/100ML-% IV SOLN
1000.0000 mg | Freq: Once | INTRAVENOUS | Status: AC
  Administered 2018-08-31: 1000 mg via INTRAVENOUS
  Filled 2018-08-31: qty 100

## 2018-08-31 MED ORDER — ONDANSETRON HCL 4 MG/2ML IJ SOLN: 4.0000 mg | Freq: Four times a day (QID) | INTRAMUSCULAR | Status: DC | PRN

## 2018-08-31 MED ORDER — ROSUVASTATIN CALCIUM 20 MG PO TABS
20.0000 mg | ORAL_TABLET | Freq: Every day | ORAL | Status: DC
  Administered 2018-09-01 – 2018-09-03 (×3): 20 mg via ORAL
  Filled 2018-08-31 (×3): qty 1

## 2018-08-31 MED ORDER — MAGNESIUM CITRATE PO SOLN: 1.0000 | Freq: Once | ORAL | Status: DC | PRN

## 2018-08-31 MED ORDER — MIDAZOLAM HCL 2 MG/2ML IJ SOLN
INTRAMUSCULAR | Status: AC
  Filled 2018-08-31: qty 2

## 2018-08-31 MED ORDER — PROPOFOL 500 MG/50ML IV EMUL
INTRAVENOUS | Status: DC | PRN
  Administered 2018-08-31: 125 ug/kg/min via INTRAVENOUS

## 2018-08-31 MED ORDER — NITROGLYCERIN 0.4 MG SL SUBL: 0.4000 mg | SUBLINGUAL_TABLET | SUBLINGUAL | Status: DC | PRN

## 2018-08-31 MED ORDER — METHOCARBAMOL 500 MG IVPB - SIMPLE MED
500.0000 mg | Freq: Four times a day (QID) | INTRAVENOUS | Status: DC | PRN
  Administered 2018-08-31: 500 mg via INTRAVENOUS
  Filled 2018-08-31: qty 50

## 2018-08-31 MED ORDER — HYDROMORPHONE HCL 1 MG/ML IJ SOLN
0.2500 mg | INTRAMUSCULAR | Status: DC | PRN
  Administered 2018-08-31 (×4): 0.5 mg via INTRAVENOUS

## 2018-08-31 SURGICAL SUPPLY — 60 items
ATTUNE MED DOME PAT 41 KNEE (Knees) ×1 IMPLANT
ATTUNE MED DOME PAT 41MM KNEE (Knees) ×1 IMPLANT
ATTUNE PS FEM LT SZ 7 CEM KNEE (Femur) ×2 IMPLANT
ATTUNE PSRP INSR SZ7 5 KNEE (Insert) ×1 IMPLANT
ATTUNE PSRP INSR SZ7 5MM KNEE (Insert) ×1 IMPLANT
BAG ZIPLOCK 12X15 (MISCELLANEOUS) ×3 IMPLANT
BANDAGE ACE 6X5 VEL STRL LF (GAUZE/BANDAGES/DRESSINGS) ×3 IMPLANT
BANDAGE ELASTIC 6 VELCRO ST LF (GAUZE/BANDAGES/DRESSINGS) ×2 IMPLANT
BASE TIBIAL ROT PLAT SZ 8 KNEE (Knees) IMPLANT
BENZOIN TINCTURE PRP APPL 2/3 (GAUZE/BANDAGES/DRESSINGS) ×3 IMPLANT
BLADE SAGITTAL 25.0X1.19X90 (BLADE) ×2 IMPLANT
BLADE SAGITTAL 25.0X1.19X90MM (BLADE) ×1
BLADE SAW SGTL 11.0X1.19X90.0M (BLADE) ×3 IMPLANT
BLADE SURG SZ10 CARB STEEL (BLADE) ×6 IMPLANT
BOOTIES KNEE HIGH SLOAN (MISCELLANEOUS) ×3 IMPLANT
BOWL SMART MIX CTS (DISPOSABLE) ×3 IMPLANT
CEMENT HV SMART SET (Cement) ×6 IMPLANT
CLOSURE WOUND 1/2 X4 (GAUZE/BANDAGES/DRESSINGS) ×1
COVER WAND RF STERILE (DRAPES) ×2 IMPLANT
CUFF TOURN SGL QUICK 34 (TOURNIQUET CUFF) ×2
CUFF TRNQT CYL 34X4X40X1 (TOURNIQUET CUFF) ×1 IMPLANT
DECANTER SPIKE VIAL GLASS SM (MISCELLANEOUS) ×2 IMPLANT
DRAPE U-SHAPE 47X51 STRL (DRAPES) ×3 IMPLANT
DRESSING AQUACEL AG SP 3.5X10 (GAUZE/BANDAGES/DRESSINGS) IMPLANT
DRSG AQUACEL AG ADV 3.5X10 (GAUZE/BANDAGES/DRESSINGS) ×3 IMPLANT
DRSG AQUACEL AG SP 3.5X10 (GAUZE/BANDAGES/DRESSINGS) ×3
DURAPREP 26ML APPLICATOR (WOUND CARE) ×3 IMPLANT
ELECT REM PT RETURN 15FT ADLT (MISCELLANEOUS) ×3 IMPLANT
GLOVE BIOGEL PI IND STRL 8 (GLOVE) ×2 IMPLANT
GLOVE BIOGEL PI INDICATOR 8 (GLOVE) ×4
GLOVE ECLIPSE 7.5 STRL STRAW (GLOVE) ×6 IMPLANT
GOWN STRL REUS W/TWL XL LVL3 (GOWN DISPOSABLE) ×6 IMPLANT
HANDPIECE INTERPULSE COAX TIP (DISPOSABLE) ×2
HOLDER FOLEY CATH W/STRAP (MISCELLANEOUS) ×2 IMPLANT
HOOD PEEL AWAY FLYTE STAYCOOL (MISCELLANEOUS) ×9 IMPLANT
IMMOBILIZER KNEE 20 (SOFTGOODS) ×3
IMMOBILIZER KNEE 20 THIGH 36 (SOFTGOODS) ×1 IMPLANT
MANIFOLD NEPTUNE II (INSTRUMENTS) ×3 IMPLANT
NEEDLE HYPO 22GX1.5 SAFETY (NEEDLE) ×3 IMPLANT
NS IRRIG 1000ML POUR BTL (IV SOLUTION) ×3 IMPLANT
PACK ICE MAXI GEL EZY WRAP (MISCELLANEOUS) ×3 IMPLANT
PACK TOTAL KNEE CUSTOM (KITS) ×3 IMPLANT
PADDING CAST COTTON 6X4 STRL (CAST SUPPLIES) ×3 IMPLANT
PIN STEINMAN FIXATION KNEE (PIN) ×2 IMPLANT
PIN THREADED HEADED SIGMA (PIN) ×2 IMPLANT
PROTECTOR NERVE ULNAR (MISCELLANEOUS) ×3 IMPLANT
SET HNDPC FAN SPRY TIP SCT (DISPOSABLE) ×1 IMPLANT
STAPLER VISISTAT 35W (STAPLE) IMPLANT
STRIP CLOSURE SKIN 1/2X4 (GAUZE/BANDAGES/DRESSINGS) ×1 IMPLANT
SUT MNCRL AB 3-0 PS2 18 (SUTURE) ×3 IMPLANT
SUT VIC AB 0 CT1 36 (SUTURE) ×3 IMPLANT
SUT VIC AB 1 CT1 36 (SUTURE) ×6 IMPLANT
SUT VIC AB 2-0 CT1 27 (SUTURE) ×4
SUT VIC AB 2-0 CT1 TAPERPNT 27 (SUTURE) ×2 IMPLANT
SYR CONTROL 10ML LL (SYRINGE) ×6 IMPLANT
TIBIAL BASE ROT PLAT SZ 8 KNEE (Knees) ×3 IMPLANT
TRAY FOLEY MTR SLVR 16FR STAT (SET/KITS/TRAYS/PACK) ×3 IMPLANT
WATER STERILE IRR 1000ML POUR (IV SOLUTION) ×6 IMPLANT
WRAP KNEE MAXI GEL POST OP (GAUZE/BANDAGES/DRESSINGS) ×2 IMPLANT
YANKAUER SUCT BULB TIP 10FT TU (MISCELLANEOUS) ×3 IMPLANT

## 2018-08-31 NOTE — Anesthesia Postprocedure Evaluation (Signed)
Anesthesia Post Note  Patient: Justin [name]  Procedure(s) Performed: TOTAL KNEE ARTHROPLASTY (Left Knee)     Patient location during evaluation: PACU Anesthesia Type: Spinal Level of consciousness: oriented and awake and alert Pain management: pain level controlled Vital Signs Assessment: post-procedure vital signs reviewed and stable Respiratory status: spontaneous breathing, respiratory function stable and patient connected to nasal cannula oxygen Cardiovascular status: blood pressure returned to baseline and stable Postop Assessment: no headache, no backache and no apparent nausea or vomiting Anesthetic complications: no    Last Vitals:  Vitals:   08/31/18 1239 08/31/18 1333  BP: 124/75 119/69  Pulse: 60 61  Resp: 17 17  Temp: (!) 34.8 C (!) 36.4 C  SpO2: 100% 100%    Last Pain:  Vitals:   08/31/18 1239  TempSrc: Axillary  PainSc:                  Barnet Glasgow

## 2018-08-31 NOTE — Anesthesia Procedure Notes (Addendum)
Spinal  Patient location during procedure: OR Start time: 08/31/2018 7:18 AM End time: 08/31/2018 7:25 AM Staffing Anesthesiologist: Barnet Glasgow, MD Performed: anesthesiologist  Preanesthetic Checklist Completed: patient identified, surgical consent, pre-op evaluation, timeout performed, IV checked, risks and benefits discussed and monitors and equipment checked Spinal Block Patient position: sitting Prep: site prepped and draped and DuraPrep Patient monitoring: heart rate, cardiac monitor, continuous pulse ox and blood pressure Approach: midline Location: L3-4 Injection technique: single-shot Needle Needle type: Pencan  Needle gauge: 24 G Needle length: 10 cm Needle insertion depth: 6 cm Assessment Sensory level: T4 Additional Notes 2 Attempt (s). Pt tolerated procedure well.

## 2018-08-31 NOTE — Op Note (Signed)
NAME: Justin [name], Justin [name] MEDICAL RECORD [id number] ACCOUNT [id number] DATE OF BIRTH:04/16/1946 FACILITY: [facility] LOCATION: [facility] PHYSICIAN:Darrius Montano L. Juliet Vasbinder, MD  OPERATIVE REPORT  DATE OF PROCEDURE:  08/31/2018  PREOPERATIVE DIAGNOSIS:  End-stage degenerative joint disease, left knee.  POSTOPERATIVE DIAGNOSIS:  End-stage degenerative joint disease, left knee.  PROCEDURE:  Left total knee replacement with an Attune system, size 7 femur, size 8 tibia, 5 mm bridging bearing, and a 41 mm all polyethylene patella.  SURGEON:  Dorna Leitz, MD  ASSISTANT:  Gaspar Skeeters PA-C, was present for the entire case and assisted with bone cuts, retraction, and closing to minimize OR time.  ANESTHESIA:  Spinal.  BRIEF HISTORY:  The patient is a 73 year old male with a long history of significant complaints of left knee pain.  He unfortunately tore his ACL.  He had significant problems with stability.  He had significant narrowing on x-ray.  We had a long talk  about treatment options, but felt that total knee replacement, probably be the most reliable course of action.  He was brought to the operating room for this procedure.  DESCRIPTION OF PROCEDURE:  The patient brought to the operating room after adequate anesthesia was obtained with a spinal anesthetic placed supine on the operating table.  Left leg was prepped and draped in usual sterile fashion.  Following this, the leg  was exsanguinated, blood pressure tourniquet inflated to 300 mmHg.  Following this, a midline incision was made in subcutaneous down to the extensor mechanism.  Medial parapatellar arthrotomy was undertaken.  Following this, attention was turned towards  the femur where an intramedullary pilot hole was drilled followed by a 4 degree valgus inclination cut and 9 mm of distal bone was resected.  Following this, attention was turned towards sizing the femur.  It sized to a 7.  Anterior and posterior cuts  were made chamfers and box.   Attention was turned to the tibia.  It was cut perpendicular to its long axis with 3 degrees posterior inclination and it is then drilled and keeled to an 8.  The medial and lateral meniscus were removed and the 30 mL of  Exparel and Marcaine were instilled into the posterior capsule for postoperative anesthesia.  Following this, attention was turned towards putting the trial components along with a 5 spacer block.  A full range of motion was achieved along with excellent  stability.  Attention was turned to the patella.  It was cut down to a level of 14 mm and a 41 paddle was chosen and lugs were drilled and the patella was placed at that point.  Following this, the knee put through a range of motion, excellent stability  and range of motion are achieved.  Attention was then turned towards removal of trial components.  The final components were then cemented into place after the knee was copiously and thoroughly irrigated with pulsatile lavage irrigation and suctioned  dry.  The components, a size 7 femur, size 8 tibia, and a 5 mm bridging bearing are all placed.  All excess bone cements were removed and attention was turned to the patella.  A 41 patella was placed and held with a clamp.  All excess bone cement was  removed here.  The cement was allowed to completely harden.  Once that was done, the tourniquet was let down, all bleeders controlled with electrocautery.  Attention was turned towards removal of the trial component.  The final 5 was placed.  Knee put  through a range of  motion.  Excellent range of motion and stability were achieved.  The medial parapatellar arthrotomy at this point is closed with #1 Vicryl running, skin with 0 and 2-0 Vicryl and 3-0 Monocryl subcuticular.  Benzoin, Steri-Strips, dry  sterile compressive dressing was applied.  The patient was taken to recovery and was noted to be in satisfactory condition.  Estimated blood loss for the procedure is minimal.  Of note, Gaspar Skeeters  assisted throughout the case with retraction, bone cuts  and close to minimize OR time.  TN/NUANCE  D:08/31/2018 T:08/31/2018 JOB:[id number]

## 2018-08-31 NOTE — Plan of Care (Signed)
Plan of care 

## 2018-08-31 NOTE — Anesthesia Procedure Notes (Signed)
Procedure Name: MAC Date/Time: 08/31/2018 7:17 AM Performed by: Niel Hummer, CRNA Pre-anesthesia Checklist: Patient identified, Emergency Drugs available, Suction available and Patient being monitored Patient Re-evaluated:Patient Re-evaluated prior to induction Oxygen Delivery Method: Simple face mask

## 2018-08-31 NOTE — Clinical Social Work Note (Signed)
Clinical Social Work Assessment  Patient Details  Name: Justin [name] MRN: [id number] Date of Birth: [birth year]  Date of referral:  08/31/18               Reason for consult:  Discharge Planning                Permission sought to share information with:  Facility Art therapist granted to share information::  Yes, Verbal Permission Granted  Name::        Agency::     Relationship::  Adult Children   Contact Information:     Housing/Transportation Living arrangements for the past 2 months:  [place] of Information:  Patient Patient Interpreter Needed:  None Criminal Activity/Legal Involvement Pertinent to Current Situation/Hospitalization:  No - Comment as needed Significant Relationships:  Adult Children Lives with:  Self Do you feel safe going back to the place where you live?  Yes Need for family participation in patient care:  Yes (Comment)  Care giving concerns:  Patient is being admitted for left total knee arthroplasty.  Social Worker assessment / plan: CSW discussed discharge planning to [facility] with the patient. Patient reports prior to admitting he reached out [facility] for placement. CSW confirmed a bed at the facility. CSW explain the insurance authorization process. Patient reports understanding. Patient reports his insurance inform him they would approve a 3 day hospital stay, he reports if he is not discharge by Monday he will probably discharge home.   Barriers to discharge: Civil Service fast streamer and PASRR.   FL2 done.  PASSR pending.   Employment status:  Retired Nurse, adult PT Recommendations:  [place] / Referral to community resources:  [place]  Patient/Family's Response to care:  Agreeable and Responding well to care.   Patient/Family's Understanding of and Emotional Response to Diagnosis, Current Treatment, and Prognosis:  Patient has a good  understanding of his discharge summary and follow up plan.   Emotional Assessment Appearance:  Appears stated age Attitude/Demeanor/Rapport:    Affect (typically observed):  Accepting Orientation:  Oriented to Self, Oriented to Situation, Oriented to Place, Oriented to  Time Alcohol / Substance use:  Not Applicable Psych involvement (Current and /or in the community):  No (Comment)  Discharge Needs  Concerns to be addressed:  Discharge Planning Concerns Readmission within the last 30 days:  No Current discharge risk:  Dependent with Mobility Barriers to Discharge:  Ship broker, [place] ([place])   Lia Hopping, LCSW 08/31/2018, 4:39 PM

## 2018-08-31 NOTE — Care Management Note (Signed)
Case Management Note  Patient Details  Name: Justin [name] MRN: [id number] Date of Birth: [birth year]  Subjective/Objective:       Plan for d/c to SNF, discharge planning per CSW. [phone]             Action/Plan:   Expected Discharge Date:                  Expected Discharge Plan:  [place]  In-House Referral:  Clinical Social Work  Discharge planning Services  CM Consult  Post Acute Care Choice:  Durable Medical Equipment Choice offered to:  Patient  DME Arranged:  N/A DME Agency:  NA  HH Arranged:  NA HH Agency:  NA  Status of Service:  Completed, signed off  If discussed at [place] of Stay Meetings, dates discussed:    Additional Comments:  Guadalupe Maple, RN 08/31/2018, 1:44 PM

## 2018-08-31 NOTE — Brief Op Note (Signed)
08/31/2018  8:50 AM  PATIENT:  Justin [name]  73 y.o. male  PRE-OPERATIVE DIAGNOSIS:  LEFT TOTAL KNEE ARTHROPLASTY  POST-OPERATIVE DIAGNOSIS:  LEFT TOTAL KNEE ARTHROPLASTY  PROCEDURE:  Procedure(s): TOTAL KNEE ARTHROPLASTY (Left)  SURGEON:  Surgeon(s) and Role:    Justin Leitz, MD - Primary  PHYSICIAN ASSISTANT:   ASSISTANTS: Justin [name]   ANESTHESIA:   spinal  EBL:  20 mL   BLOOD ADMINISTERED:none  DRAINS: none   LOCAL MEDICATIONS USED:  MARCAINE    and OTHER experel  SPECIMEN:  No Specimen  DISPOSITION OF SPECIMEN:  N/A  [name]:  YES  TOURNIQUET:   Total Tourniquet Time Documented: Thigh (Left) - 50 minutes Total: Thigh (Left) - 50 minutes   DICTATION: .Other Dictation: Dictation Number [id number]  PLAN OF CARE: Admit for overnight observation  PATIENT DISPOSITION:  PACU - hemodynamically stable.   Delay start of Pharmacological VTE agent (>24hrs) due to surgical blood loss or risk of bleeding: no

## 2018-08-31 NOTE — Progress Notes (Signed)
CSW consult-SNF CSW following for disposition needs to SNF[facility]. CSW confirm with SNF representative Andee Poles they will accept the patient. The patient will need prior [facility] insurance authorization prior to admitting. [employer] is closed on weekends, the earliest the patient can admit is Monday. The facility will need PT notes to initiate the authorization process.  Patient will need a Level II PASRR screening (Physician please sign 30 day note on the patient chart).  Kathrin Greathouse, Marlinda Mike, MSW Clinical Social Worker  [phone] 08/31/2018  11:51 AM

## 2018-08-31 NOTE — NC FL2 (Signed)
[place] LEVEL OF CARE SCREENING TOOL     IDENTIFICATION  Patient Name: Justin [name] Birthdate: 25-Oct-1945 Sex: male Admission Date (Current Location): 08/31/2018  [facility] [place] and [place] Number:  Herbalist and Address:  [facility],  [place] [address] [place]      Provider Number: [id number]  Attending Physician Name and Address:  Dorna Leitz, MD  Relative Name and Phone Number:       Current Level of Care: Hospital Recommended Level of Care: [place] Prior Approval Number:    Date Approved/Denied:   PASRR Number: pending  Discharge Plan:      Current Diagnoses: Patient Active Problem List   Diagnosis Date Noted  . Primary osteoarthritis of left knee 08/31/2018  . Neck pain 12/08/2017  . Hyperlipidemia 10/30/2017  . Anemia 06/19/2017  . Hyperglycemia 06/19/2017  . Elevated prolactin level ([place]) 06/19/2017  . Closed fracture of right hand 06/19/2017  . Bipolar affective disorder, depressed, severe ([place]) 06/12/2017  . Benzodiazepine withdrawal with complication ([place]) 17/51/0258  . Nail lesion 02/25/2017  . Hx of adenomatous colonic polyps 06/22/2016  . Memory changes 06/09/2016  . CAD (coronary artery disease)   . Right knee pain 01/01/2016  . Olecranon bursitis of right elbow 12/10/2015  . Bipolar I disorder ([place]) 06/26/2015  . Routine general medical examination at a health care facility 03/26/2015  . Medicare annual wellness visit, subsequent 03/26/2015  . Rash and nonspecific skin eruption 03/13/2015  . Generalized anxiety disorder 12/23/2014  . Low back pain 12/23/2014  . Essential hypertension 12/23/2014    Orientation RESPIRATION BLADDER Height & Weight     Self, Time, Situation, Place  Normal Continent Weight: 169 lb 12.1 oz (77 kg) Height:  5\' 10"  (177.8 cm)  BEHAVIORAL SYMPTOMS/MOOD NEUROLOGICAL BOWEL NUTRITION STATUS      Continent Diet  AMBULATORY STATUS COMMUNICATION OF NEEDS Skin    Extensive Assist Verbally Surgical wounds                       Personal Care Assistance Level of Assistance  Bathing, Feeding, Dressing Bathing Assistance: Limited assistance Feeding assistance: Independent Dressing Assistance: Maximum assistance     Functional Limitations Info  Sight, Hearing, Speech Sight Info: Adequate Hearing Info: Adequate Speech Info: Adequate    SPECIAL CARE FACTORS FREQUENCY  PT (By licensed PT), OT (By licensed OT)     PT Frequency: 7x/week OT Frequency: 7x/week            Contractures Contractures Info: Not present    Additional Factors Info  Psychotropic Code Status Info: Fullcode  Allergies Info: Allergies: Ambien Zolpidem Tartrate, Seroquel Quetiapine           Current Medications (08/31/2018):  This is the current hospital active medication list Current Facility-Administered Medications  Medication Dose Route Frequency Provider Last Rate Last Dose  . 0.9 %  sodium chloride infusion   Intravenous Continuous Gary Fleet, PA-C 100 mL/hr at 08/31/18 1153    . acetaminophen (OFIRMEV) 10 MG/ML IV           . [START ON 09/01/2018] acetaminophen (TYLENOL) tablet 325-650 mg  325-650 mg Oral Q6H PRN Gary Fleet, PA-C   650 mg at 08/31/18 1301  . ALPRAZolam Duanne Moron) tablet 0.5 mg  0.5 mg Oral BID PRN Gary Fleet, PA-C      . alum & mag hydroxide-simeth (MAALOX/MYLANTA) 200-200-20 MG/5ML suspension 30 mL  30 mL Oral Q4H PRN Modena Slater,  Jeneen Rinks, PA-C      . bisacodyl (DULCOLAX) EC tablet 5 mg  5 mg Oral Daily PRN Gary Fleet, PA-C      . ceFAZolin (ANCEF) IVPB 2g/100 mL premix  2 g Intravenous Q6H Gary Fleet, PA-C 200 mL/hr at 08/31/18 1303 2 g at 08/31/18 1303  . celecoxib (CELEBREX) capsule 200 mg  200 mg Oral BID Gary Fleet, PA-C      . [START ON 09/01/2018] clopidogrel (PLAVIX) tablet 75 mg  75 mg Oral Daily Gary Fleet, PA-C      . dexamethasone (DECADRON) injection 10 mg  10 mg Intravenous Q12H Gary Fleet, PA-C       . [START ON 09/01/2018] diltiazem (CARDIZEM CD) 24 hr capsule 180 mg  180 mg Oral Daily Gary Fleet, PA-C      . diphenhydrAMINE (BENADRYL) 12.5 MG/5ML elixir 12.5-25 mg  12.5-25 mg Oral Q4H PRN Gary Fleet, PA-C      . docusate sodium (COLACE) capsule 100 mg  100 mg Oral BID Gary Fleet, PA-C      . doxepin (SINEQUAN) capsule 25 mg  25 mg Oral QHS PRN Gary Fleet, PA-C      . fentaNYL (SUBLIMAZE) 100 MCG/2ML injection           . gabapentin (NEURONTIN) capsule 300 mg  300 mg Oral BID Gary Fleet, PA-C   300 mg at 08/31/18 1301  . HYDROmorphone (DILAUDID) 1 MG/ML injection           . HYDROmorphone (DILAUDID) injection 0.5-1 mg  0.5-1 mg Intravenous Q4H PRN Gary Fleet, PA-C      . [START ON 09/01/2018] lamoTRIgine (LAMICTAL) tablet 150 mg  150 mg Oral Daily Gary Fleet, PA-C      . magnesium citrate solution 1 Bottle  1 Bottle Oral Once PRN Gary Fleet, PA-C      . methocarbamol (ROBAXIN) tablet 500 mg  500 mg Oral Q6H PRN Gary Fleet, PA-C       Or  . methocarbamol (ROBAXIN) 500 mg in dextrose 5 % 50 mL IVPB  500 mg Intravenous Q6H PRN Gary Fleet, PA-C 100 mL/hr at 08/31/18 0940 500 mg at 08/31/18 0940  . methocarbamol (ROBAXIN) 500 MG/50 ML IVPB           . nitroGLYCERIN (NITROSTAT) SL tablet 0.4 mg  0.4 mg Sublingual Q5 min PRN Gary Fleet, PA-C      . ondansetron [facility]) tablet 4 mg  4 mg Oral Q6H PRN Gary Fleet, PA-C       Or  . ondansetron [facility]) injection 4 mg  4 mg Intravenous Q6H PRN Gary Fleet, PA-C      . oxyCODONE (Oxy IR/ROXICODONE) immediate release tablet 5-10 mg  5-10 mg Oral Q4H PRN Gary Fleet, PA-C   5 mg at 08/31/18 1301  . polyethylene glycol (MIRALAX / GLYCOLAX) packet 17 g  17 g Oral Daily PRN Gary Fleet, PA-C      . [START ON 09/01/2018] rosuvastatin (CRESTOR) tablet 20 mg  20 mg Oral Daily Gary Fleet, PA-C         Discharge Medications: Please see discharge summary for a list of discharge  medications.  Relevant Imaging Results:  Relevant Lab Results:   Additional Information [id number]  Lia Hopping, LCSW

## 2018-08-31 NOTE — Evaluation (Signed)
Physical Therapy Evaluation Patient Details Name: Justin [name] MRN: [id number] DOB: 03/30/1946 Today's Date: 08/31/2018   History of Present Illness  Pt s/p L TKR and with hx of CAD, PTSD and bipolar  Clinical Impression  Pt s/p L TKR and presents with decreased L LE strength/ROM and post op pain limiting functional mobility.  Pt would benefit from follow up rehab at SNF level to maximize IND and safety prior to return home alone.    Follow Up Recommendations SNF    Equipment Recommendations  Rolling walker with 5" wheels    Recommendations for Other Services       Precautions / Restrictions Precautions Precautions: Fall;Knee Required Braces or Orthoses: Knee Immobilizer - Left Knee Immobilizer - Left: On when out of bed or walking;Discontinue once straight leg raise with < 10 degree lag Restrictions Weight Bearing Restrictions: No Other Position/Activity Restrictions: wbat      Mobility  Bed Mobility Overal bed mobility: Needs Assistance Bed Mobility: Supine to Sit     Supine to sit: Min assist     General bed mobility comments: cues for sequence and use of R LE to self assist  Transfers Overall transfer level: Needs assistance Equipment used: Rolling walker (2 wheeled) Transfers: Sit to/from Stand Sit to Stand: Min assist         General transfer comment: cues for LE management and use of UEs to self assist  Ambulation/Gait Ambulation/Gait assistance: Min assist Gait Distance (Feet): 57 Feet Assistive device: Rolling walker (2 wheeled) Gait Pattern/deviations: Step-to pattern;Decreased step length - right;Decreased step length - left;Shuffle;Trunk flexed Gait velocity: decr   General Gait Details: cues for sequence, posture and position from [employer]            Wheelchair Mobility    Modified Rankin (Stroke Patients Only)       Balance Overall balance assessment: Mild deficits observed, not formally tested                                            Pertinent Vitals/Pain Pain Assessment: 0-10 Pain Score: 4  Pain Location: L knee Pain Descriptors / Indicators: Aching;Sore Pain Intervention(s): Limited activity within patient's tolerance;Monitored during session;Premedicated before session;Ice applied    Home Living Family/patient expects to be discharged to:: Skilled nursing facility Living Arrangements: Alone                    Prior Function Level of Independence: Independent               Hand Dominance        Extremity/Trunk Assessment   Upper Extremity Assessment Upper Extremity Assessment: Overall WFL for tasks assessed    Lower Extremity Assessment Lower Extremity Assessment: LLE deficits/detail    Cervical / Trunk Assessment Cervical / Trunk Assessment: Normal  Communication   Communication: No difficulties  Cognition Arousal/Alertness: Awake/alert Behavior During Therapy: WFL for tasks assessed/performed Overall Cognitive Status: Within Functional Limits for tasks assessed                                        General Comments      Exercises Total Joint Exercises Ankle Circles/Pumps: AROM;Both;15 reps;Supine   Assessment/Plan    PT Assessment Patient needs continued PT services  PT  Problem List Decreased strength;Decreased range of motion;Decreased activity tolerance;Decreased mobility;Decreased knowledge of use of DME;Pain       PT Treatment Interventions DME instruction;Gait training;Stair training;Functional mobility training;Therapeutic activities;Therapeutic exercise;Patient/family education    PT Goals (Current goals can be found in the Care Plan section)  Acute Rehab PT Goals Patient Stated Goal: Regain IND PT Goal Formulation: With patient Time For Goal Achievement: 09/07/18 Potential to Achieve Goals: Good    Frequency 7X/week   Barriers to discharge        Co-evaluation               AM-PAC PT "6 Clicks"  Mobility  Outcome Measure Help needed turning from your back to your side while in a flat bed without using bedrails?: A Little Help needed moving from lying on your back to sitting on the side of a flat bed without using bedrails?: A Little Help needed moving to and from a bed to a chair (including a wheelchair)?: A Little Help needed standing up from a chair using your arms (e.g., wheelchair or bedside chair)?: A Little Help needed to walk in hospital room?: A Little Help needed climbing 3-5 steps with a railing? : A Lot 6 Click Score: 17    End of Session Equipment Utilized During Treatment: Gait belt;Left knee immobilizer Activity Tolerance: Patient tolerated treatment well Patient left: in chair;with call bell/phone within reach Nurse Communication: Mobility status PT Visit Diagnosis: Difficulty in walking, not elsewhere classified (R26.2)    Time: 7026-3785 PT Time Calculation (min) (ACUTE ONLY): 33 min   Charges:   PT Evaluation $PT Eval Low Complexity: 1 Low PT Treatments $Gait Training: 8-22 mins        [place] Pager [phone] Office [phone]   Shakerria Parran 08/31/2018, 2:45 PM

## 2018-08-31 NOTE — Discharge Instructions (Signed)

## 2018-08-31 NOTE — Transfer of Care (Signed)
Immediate Anesthesia Transfer of Care Note  Patient: Justin [name]  Procedure(s) Performed: TOTAL KNEE ARTHROPLASTY (Left Knee)  Patient Location: PACU  Anesthesia Type:Spinal and MAC combined with regional for post-op pain  Level of Consciousness: awake, alert  and oriented  Airway & Oxygen Therapy: Patient Spontanous Breathing and Patient connected to face mask oxygen  Post-op Assessment: Report given to RN, Post -op Vital signs reviewed and stable and c/o pain.  Post vital signs: Reviewed and stable  Last Vitals:  Vitals Value Taken Time  BP    Temp    Pulse    Resp    SpO2      Last Pain:  Vitals:   08/31/18 0554  TempSrc: Oral      Patients Stated Pain Goal: 4 (19/75/88 3254)  Complications: No apparent anesthesia complications

## 2018-08-31 NOTE — Anesthesia Procedure Notes (Signed)
Anesthesia Regional Block: Adductor canal block   Pre-Anesthetic Checklist: ,, timeout performed, Correct Patient, Correct Site, Correct Laterality, Correct Procedure, Correct Position, site marked, Risks and benefits discussed,  Surgical consent,  Pre-op evaluation,  At surgeon's request and post-op pain management  Laterality: Lower and Left  Prep: chloraprep       Needles:  Injection technique: Single-shot  Needle Type: Echogenic Needle     Needle Length: 9cm  Needle Gauge: 22     Additional Needles:   Procedures:,,,, ultrasound used (permanent image in chart),,,,  Narrative:  Start time: 08/31/2018 6:55 AM End time: 08/31/2018 7:02 AM Injection made incrementally with aspirations every 5 mL.  Performed by: Personally  Anesthesiologist: Barnet Glasgow, MD  Additional Notes: Block assessed prior to surgery. Pt tolerated procedure well.

## 2018-08-31 NOTE — H&P (Signed)
TOTAL KNEE ADMISSION H&P  Patient is being admitted for left total knee arthroplasty.  Subjective:  Chief Complaint:left knee pain.  HPI: Justin [name], 73 y.o. male, has a history of pain and functional disability in the left knee due to arthritis and has failed non-surgical conservative treatments for greater than 12 weeks to includeNSAID's and/or analgesics, corticosteriod injections and activity modification.  Onset of symptoms was abrupt, starting 1 years ago with gradually worsening course since that time. The patient noted no past surgery on the left knee(s).  Patient currently rates pain in the left knee(s) at 8 out of 10 with activity. Patient has night pain, worsening of pain with activity and weight bearing and pain that interferes with activities of daily living.  Patient has evidence of subchondral cysts, periarticular osteophytes and joint space narrowing by imaging studies. This patient has had Failure of all reasonable conservative care. There is no active infection.  Patient Active Problem List   Diagnosis Date Noted  . Neck pain 12/08/2017  . Hyperlipidemia 10/30/2017  . Anemia 06/19/2017  . Hyperglycemia 06/19/2017  . Elevated prolactin level ([place]) 06/19/2017  . Closed fracture of right hand 06/19/2017  . Bipolar affective disorder, depressed, severe ([place]) 06/12/2017  . Benzodiazepine withdrawal with complication ([place]) 72/82/0601  . Nail lesion 02/25/2017  . Hx of adenomatous colonic polyps 06/22/2016  . Memory changes 06/09/2016  . CAD (coronary artery disease)   . Right knee pain 01/01/2016  . Olecranon bursitis of right elbow 12/10/2015  . Bipolar I disorder ([place]) 06/26/2015  . Routine general medical examination at a health care facility 03/26/2015  . Medicare annual wellness visit, subsequent 03/26/2015  . Rash and nonspecific skin eruption 03/13/2015  . Generalized anxiety disorder 12/23/2014  . Low back pain 12/23/2014  . Essential hypertension 12/23/2014    Past Medical History:  Diagnosis Date  . Anxiety and depression   . Arthritis    knees, c-spine // gets lumbar ESI q 6 mos  . Bipolar disorder ([place])   . CAD (coronary artery disease)    [place] 08/2009 >> LHC ([facility]) - mLAD 70, Dx 65 >> PCI:  3x15 mm Endeavor DES to mLAD and 2.5x12 mm Endeavor DES to Dx // S/p NSTEMI >> LHC 8/12 ([facility]) - LM ok, LAD prox and mid 40; LAD stent ok, Dx stent ok, mRCA 30, mLCx 50 >> med Rx // ETT-Echo 2/17 ([facility]): Normal, EF 55-60 at rest  . Chicken pox   . 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 Grief   . History of echocardiogram    Echo 3/19: Mild concentric LVH, EF 60-65, normal wall motion, grade 1 diastolic dysfunction, mild AI, mildly dilated aortic root (40 mm), MAC, mild LAE, atrial septal lipomatous hypertrophy  . History of non-ST elevation myocardial infarction (NSTEMI)   . History of nuclear stress test    Nuclear stress test 3/19: EF 57, inferior/inferoseptal/inferolateral defect consistent with probable soft tissue attenuation (cannot exclude subendocardial scar), no ischemia, intermediate risk >> Echo 3/19 normal EF, normal wall motion  . History of pneumonia 2011  . History of prostatitis 1990s  . Hx of adenomatous colonic polyps 06/22/2016  . Hyperlipidemia   . Hypertension   . Migraines    prior history  . PTSD (post-traumatic stress disorder)     Past Surgical History:  Procedure Laterality Date  . CERVICAL DISCECTOMY    . COLONOSCOPY  03/2017  . ELBOW SURGERY Left   . ELBOW SURGERY Right   .  INGUINAL HERNIA REPAIR Bilateral    1996, 1997  . KNEE ARTHROSCOPY Right   . KNEE CARTILAGE SURGERY Left   . NASAL SEPTUM SURGERY    . STENT PLACEMENT VASCULAR ([place] [facility])  09/02/2010  . TONSILLECTOMY      Current Facility-Administered Medications  Medication Dose Route Frequency Provider Last Rate Last Dose  . bupivacaine liposome (EXPAREL) 1.3 % injection 266 mg  20 mL Infiltration Once Dorna Leitz, MD      . ceFAZolin (ANCEF)  IVPB 2g/100 mL premix  2 g Intravenous On Call to OR Dorna Leitz, MD      . chlorhexidine (HIBICLENS) 4 % liquid 4 application  60 mL Topical Once Dorna Leitz, MD      . lactated ringers infusion   Intravenous Continuous Barnet Glasgow, MD 20 mL/hr at 08/31/18 [phone]    . tranexamic acid (CYKLOKAPRON) IVPB 1,000 mg  1,000 mg Intravenous To OR Dorna Leitz, MD       Facility-Administered Medications Ordered in Other Encounters  Medication Dose Route Frequency Provider Last Rate Last Dose  . fentaNYL (SUBLIMAZE) injection   Intravenous Anesthesia Intra-op Niel Hummer, CRNA   50 mcg at 08/31/18 9449  . midazolam (VERSED) injection   Intravenous Anesthesia Intra-op Niel Hummer, CRNA   1 mg at 08/31/18 6759   Allergies  Allergen Reactions  . Ambien [Zolpidem Tartrate] Other (See Comments)    Blackout, memory issues  . Seroquel [Quetiapine]     SEVERE NIGHTMARES, SLEEPWALK AND NIGHT DRIVE WITH NO RECOLLECTION UPON WAKENING    Social History   Tobacco Use  . Smoking status: Never Smoker  . Smokeless tobacco: Never Used  Substance Use Topics  . Alcohol use: No    Alcohol/week: 0.0 standard drinks    Family History  Problem Relation Age of Onset  . Arthritis Mother   . Heart disease Mother        s/p CABG  . Hypertension Mother   . Alzheimer's disease Mother   . Heart attack Mother   . Arthritis Father   . Hyperlipidemia Father   . Heart disease Father        s/p CABG  . Stroke Father   . Hypertension Father   . Heart attack Father   . Multiple sclerosis Sister   . Diabetes Sister   . Lung cancer Paternal Grandmother   . Diabetes Sister   . Heart attack Sister   . Pulmonary embolism Sister        died  . Lung cancer Maternal Aunt   . Lung cancer Paternal Aunt   . Depression Brother        suicide     ROS ROS: I have reviewed the patient's review of systems thoroughly and there are no positive responses as relates to the HPI. Objective:  Physical  Exam  Vital signs in last 24 hours: Temp:  [98 F (36.7 C)] 98 F (36.7 C) (01/10 0554) Pulse Rate:  [66] 66 (01/10 0554) Resp:  [18] 18 (01/10 0554) BP: (128)/(81) 128/81 (01/10 0554) Weight:  [77.3 kg] 77.3 kg (01/10 0554) Well-developed well-nourished patient in no acute distress. Alert and oriented x3 HEENT:within normal limits Cardiac: Regular rate and rhythm Pulmonary: Lungs clear to auscultation Abdomen: Soft and nontender.  Normal active bowel sounds  Musculoskeletal: Left knee: Limited range of motion.  Painful range of motion.  1+ effusion.  No instability.  Labs: Recent Results (from the past 2160 hour(s))  CBC with Differential/Platelet  Status: Abnormal   Collection Time: 08/21/18  2:48 PM  Result Value Ref Range   WBC 5.4 4.0 - 10.5 K/uL   RBC 4.47 4.22 - 5.81 MIL/uL   Hemoglobin 14.6 13.0 - 17.0 g/dL   HCT 44.8 39.0 - 52.0 %   MCV 100.2 (H) 80.0 - 100.0 fL   MCH 32.7 26.0 - 34.0 pg   MCHC 32.6 30.0 - 36.0 g/dL   RDW 12.5 11.5 - 15.5 %   Platelets 221 150 - 400 K/uL   nRBC 0.0 0.0 - 0.2 %   Neutrophils Relative % 71 %   Neutro Abs 3.9 1.7 - 7.7 K/uL   Lymphocytes Relative 17 %   Lymphs Abs 0.9 0.7 - 4.0 K/uL   Monocytes Relative 9 %   Monocytes Absolute 0.5 0.1 - 1.0 K/uL   Eosinophils Relative 1 %   Eosinophils Absolute 0.0 0.0 - 0.5 K/uL   Basophils Relative 1 %   Basophils Absolute 0.0 0.0 - 0.1 K/uL   Immature Granulocytes 1 %   Abs Immature Granulocytes 0.03 0.00 - 0.07 K/uL    Comment: Performed at [facility], [place] [address] [place], [place] [address]  Urinalysis, Routine w reflex microscopic     Status: Abnormal   Collection Time: 08/21/18  2:48 PM  Result Value Ref Range   Color, Urine YELLOW YELLOW   APPearance CLEAR CLEAR   Specific Gravity, Urine 1.014 1.005 - 1.030   pH 6.0 5.0 - 8.0   Glucose, UA NEGATIVE NEGATIVE mg/dL   Hgb urine dipstick NEGATIVE NEGATIVE   Bilirubin Urine NEGATIVE NEGATIVE   Ketones, ur  20 (A) NEGATIVE mg/dL   Protein, ur NEGATIVE NEGATIVE mg/dL   Nitrite NEGATIVE NEGATIVE   Leukocytes, UA NEGATIVE NEGATIVE    Comment: Performed at [facility] [address], [place], [place] [address]  APTT     Status: None   Collection Time: 08/21/18  2:48 PM  Result Value Ref Range   aPTT 31 24 - 36 seconds    Comment: Performed at [facility], [place] [address] [place], [place] [address]  Comprehensive metabolic panel     Status: Abnormal   Collection Time: 08/21/18  2:48 PM  Result Value Ref Range   Sodium 137 135 - 145 mmol/L   Potassium 3.9 3.5 - 5.1 mmol/L   Chloride 102 98 - 111 mmol/L   CO2 25 22 - 32 mmol/L   Glucose, Bld 96 70 - 99 mg/dL   BUN 13 8 - 23 mg/dL   Creatinine, Ser 0.93 0.61 - 1.24 mg/dL   Calcium 9.4 8.9 - 10.3 mg/dL   Total Protein 7.2 6.5 - 8.1 g/dL   Albumin 4.8 3.5 - 5.0 g/dL   AST 31 15 - 41 U/L   ALT 19 0 - 44 U/L   Alkaline Phosphatase 52 38 - 126 U/L   Total Bilirubin 1.7 (H) 0.3 - 1.2 mg/dL   GFR calc non Af Amer >60 >60 mL/min   GFR calc Af Amer >60 >60 mL/min   Anion gap 10 5 - 15    Comment: Performed at [facility], [place] [address] [place],  [address]  Protime-INR     Status: None   Collection Time: 08/21/18  2:48 PM  Result Value Ref Range   Prothrombin Time 12.6 11.4 - 15.2 seconds   INR 0.95     Comment: Performed at [facility], [place]  [address] [place], [place] [address]  Surgical pcr screen     Status: None   Collection Time: 08/21/18  4:07 PM  Result Value Ref Range   MRSA, PCR NEGATIVE NEGATIVE   Staphylococcus aureus NEGATIVE NEGATIVE    Comment: (NOTE) The Xpert SA Assay (FDA approved for NASAL specimens in patients 64 years of age and older), is one component of a comprehensive surveillance program. It is not intended to diagnose infection nor to guide or monitor treatment. Performed at [facility], [place] [address] [place], [place] [address]      Estimated body mass index is 24.12 kg/m as calculated from the following:   Height as of this encounter: 5' 10.5" (1.791 m).   Weight as of this encounter: 77.3 kg.   Imaging Review Plain radiographs demonstrate severe degenerative joint disease of the left knee(s). The overall alignment ismild varus. The bone quality appears to be fair for age and reported activity level.   Preoperative templating of the joint replacement has been completed, documented, and submitted to the Operating Room personnel in order to optimize intra-operative equipment management.    Patient's anticipated LOS is less than 2 midnights, meeting these requirements: - Younger than 1 - Lives within 1 hour of care - Has a competent adult at home to recover with post-op recover - NO history of  - Chronic pain requiring opiods  - Diabetes  - Coronary Artery Disease  - Heart failure  - Heart attack  - Stroke  - DVT/VTE  - Cardiac arrhythmia  - Respiratory Failure/COPD  - Renal failure  - Anemia  - Advanced Liver disease        Assessment/Plan:  End stage arthritis, left knee   The patient history, physical examination, clinical judgment of the provider and imaging studies are consistent with end stage degenerative joint disease of the left knee(s) and total knee arthroplasty is deemed medically necessary. The treatment options including medical management, injection therapy arthroscopy and arthroplasty were discussed at length. The risks and benefits of total knee arthroplasty were presented and reviewed. The risks due to aseptic loosening, infection, stiffness, patella tracking problems, thromboembolic complications and other imponderables were discussed. The patient acknowledged the explanation, agreed to proceed with the plan and consent was signed. Patient is being admitted for inpatient treatment for surgery, pain control, PT, OT, prophylactic antibiotics, VTE  prophylaxis, progressive ambulation and ADL's and discharge planning. The patient is planning to be discharged To rehab given the lack of home resources

## 2018-09-01 DIAGNOSIS — M1712 Unilateral primary osteoarthritis, left knee: Secondary | ICD-10-CM | POA: Diagnosis not present

## 2018-09-01 LAB — CBC
HEMATOCRIT: 35.7 % — AB (ref 39.0–52.0)
Hemoglobin: 11.4 g/dL — ABNORMAL LOW (ref 13.0–17.0)
MCH: 31.8 pg (ref 26.0–34.0)
MCHC: 31.9 g/dL (ref 30.0–36.0)
MCV: 99.4 fL (ref 80.0–100.0)
Platelets: 220 10*3/uL (ref 150–400)
RBC: 3.59 MIL/uL — ABNORMAL LOW (ref 4.22–5.81)
RDW: 12.1 % (ref 11.5–15.5)
WBC: 10.3 10*3/uL (ref 4.0–10.5)
nRBC: 0 % (ref 0.0–0.2)

## 2018-09-01 NOTE — Progress Notes (Signed)
Physical Therapy Treatment Patient Details Name: Justin [name] MRN: [id number] DOB: 11/26/1945 Today's Date: 09/01/2018    History of Present Illness Pt s/p L TKR and with hx of CAD, PTSD and bipolar    PT Comments    Pt performed therex program with assist and multiple rest breaks 2* pain.   Follow Up Recommendations  SNF     Equipment Recommendations  Rolling walker with 5" wheels    Recommendations for Other Services       Precautions / Restrictions Precautions Precautions: Fall;Knee Required Braces or Orthoses: Knee Immobilizer - Left Knee Immobilizer - Left: On when out of bed or walking;Discontinue once straight leg raise with < 10 degree lag Restrictions Weight Bearing Restrictions: No Other Position/Activity Restrictions: WBAT    Mobility  Bed Mobility Overal bed mobility: Needs Assistance Bed Mobility: Supine to Sit     Supine to sit: Min assist     General bed mobility comments: cues for sequence and use of R LE to self assist  Transfers Overall transfer level: Needs assistance Equipment used: Rolling walker (2 wheeled) Transfers: Sit to/from Stand Sit to Stand: Min assist         General transfer comment: cues for LE management and use of UEs to self assist  Ambulation/Gait Ambulation/Gait assistance: Min assist Gait Distance (Feet): 48 Feet Assistive device: Rolling walker (2 wheeled) Gait Pattern/deviations: Step-to pattern;Decreased step length - right;Decreased step length - left;Shuffle;Trunk flexed     General Gait Details: cues for sequence, posture and position from [employer]             Wheelchair Mobility    Modified Rankin (Stroke Patients Only)       Balance Overall balance assessment: Mild deficits observed, not formally tested                                          Cognition Arousal/Alertness: Awake/alert Behavior During Therapy: WFL for tasks assessed/performed Overall Cognitive  Status: Within Functional Limits for tasks assessed                                        Exercises Total Joint Exercises Ankle Circles/Pumps: AROM;Both;15 reps;Supine Quad Sets: AROM;Both;10 reps;Supine Heel Slides: AAROM;Left;15 reps;Supine Straight Leg Raises: AAROM;AROM;Left;15 reps;Supine Goniometric ROM: AAROM L knee -10 - 60    General Comments        Pertinent Vitals/Pain Pain Assessment: 0-10 Pain Score: 8  Pain Location: L knee Pain Descriptors / Indicators: Aching;Sore Pain Intervention(s): Premedicated before session;Ice applied;Limited activity within patient's tolerance;Monitored during session    Home Living                      Prior Function            PT Goals (current goals can now be found in the care plan section) Acute Rehab PT Goals Patient Stated Goal: Regain IND PT Goal Formulation: With patient Time For Goal Achievement: 09/07/18 Potential to Achieve Goals: Good Progress towards PT goals: Progressing toward goals    Frequency    7X/week      PT Plan Current plan remains appropriate    Co-evaluation              AM-PAC PT "6 Clicks" Mobility  Outcome Measure  Help needed turning from your back to your side while in a flat bed without using bedrails?: A Little Help needed moving from lying on your back to sitting on the side of a flat bed without using bedrails?: A Little Help needed moving to and from a bed to a chair (including a wheelchair)?: A Little Help needed standing up from a chair using your arms (e.g., wheelchair or bedside chair)?: A Little Help needed to walk in hospital room?: A Little Help needed climbing 3-5 steps with a railing? : A Lot 6 Click Score: 17    End of Session Equipment Utilized During Treatment: Gait belt Activity Tolerance: Patient tolerated treatment well Patient left: in chair;with call bell/phone within reach Nurse Communication: Mobility status PT Visit  Diagnosis: Difficulty in walking, not elsewhere classified (R26.2)     Time: 1010-1025 PT Time Calculation (min) (ACUTE ONLY): 15 min  Charges:  $Gait Training: 8-22 mins $Therapeutic Exercise: 8-22 mins                     [place] Pager [phone] Office [phone]    Genella Bas 09/01/2018, 12:34 PM

## 2018-09-01 NOTE — Evaluation (Signed)
Physical Therapy Evaluation Patient Details Name: Justin [name] MRN: [id number] DOB: 1945/12/12 Today's Date: 09/01/2018   History of Present Illness  Pt s/p L TKR and with hx of CAD, PTSD and bipolar  Clinical Impression  Pt continues cooperative but ltd this am by pain.  Therex deferred to after IV pain meds.    Follow Up Recommendations SNF    Equipment Recommendations  Rolling walker with 5" wheels    Recommendations for Other Services       Precautions / Restrictions Precautions Precautions: Fall;Knee Required Braces or Orthoses: Knee Immobilizer - Left Knee Immobilizer - Left: On when out of bed or walking;Discontinue once straight leg raise with < 10 degree lag(Pt performed IND SLR this am) Restrictions Weight Bearing Restrictions: No Other Position/Activity Restrictions: WBAT      Mobility  Bed Mobility Overal bed mobility: Needs Assistance Bed Mobility: Supine to Sit     Supine to sit: Min assist     General bed mobility comments: cues for sequence and use of R LE to self assist  Transfers Overall transfer level: Needs assistance Equipment used: Rolling walker (2 wheeled) Transfers: Sit to/from Stand Sit to Stand: Min assist         General transfer comment: cues for LE management and use of UEs to self assist  Ambulation/Gait Ambulation/Gait assistance: Min assist Gait Distance (Feet): 48 Feet Assistive device: Rolling walker (2 wheeled) Gait Pattern/deviations: Step-to pattern;Decreased step length - right;Decreased step length - left;Shuffle;Trunk flexed     General Gait Details: cues for sequence, posture and position from [employer]            Wheelchair Mobility    Modified Rankin (Stroke Patients Only)       Balance Overall balance assessment: Mild deficits observed, not formally tested                                           Pertinent Vitals/Pain Pain Assessment: 0-10 Pain Score: 8  Pain Location:  L knee Pain Descriptors / Indicators: Aching;Sore Pain Intervention(s): Limited activity within patient's tolerance;Monitored during session;Premedicated before session;Ice applied    Home Living                        Prior Function                 Hand Dominance        Extremity/Trunk Assessment                Communication      Cognition Arousal/Alertness: Awake/alert Behavior During Therapy: WFL for tasks assessed/performed Overall Cognitive Status: Within Functional Limits for tasks assessed                                        General Comments      Exercises     Assessment/Plan    PT Assessment    PT Problem List         PT Treatment Interventions      PT Goals (Current goals can be found in the Care Plan section)  Acute Rehab PT Goals Patient Stated Goal: Regain IND PT Goal Formulation: With patient Time For Goal Achievement: 09/07/18 Potential to Achieve Goals: Good    Frequency  7X/week   Barriers to discharge        Co-evaluation               AM-PAC PT "6 Clicks" Mobility  Outcome Measure Help needed turning from your back to your side while in a flat bed without using bedrails?: A Little Help needed moving from lying on your back to sitting on the side of a flat bed without using bedrails?: A Little Help needed moving to and from a bed to a chair (including a wheelchair)?: A Little Help needed standing up from a chair using your arms (e.g., wheelchair or bedside chair)?: A Little Help needed to walk in hospital room?: A Little Help needed climbing 3-5 steps with a railing? : A Lot 6 Click Score: 17    End of Session Equipment Utilized During Treatment: Gait belt Activity Tolerance: Patient tolerated treatment well Patient left: in chair;with call bell/phone within reach Nurse Communication: Mobility status PT Visit Diagnosis: Difficulty in walking, not elsewhere classified (R26.2)    Time:  2725-3664 PT Time Calculation (min) (ACUTE ONLY): 16 min   Charges:     PT Treatments $Gait Training: 8-22 mins        [place] Pager [phone] Office [phone]   Justin [name] 09/01/2018, 12:28 PM

## 2018-09-01 NOTE — Progress Notes (Signed)
Physical Therapy Treatment Patient Details Name: Justin [name] MRN: [id number] DOB: 12/14/1945 Today's Date: 09/01/2018    History of Present Illness Pt s/p L TKR and with hx of CAD, PTSD and bipolar    PT Comments    Pt continues cooperative and with improvement in activity tolerance this pm 2* improved pain control.   Follow Up Recommendations  SNF     Equipment Recommendations  Rolling walker with 5" wheels    Recommendations for Other Services       Precautions / Restrictions Precautions Precautions: Fall;Knee Required Braces or Orthoses: Knee Immobilizer - Left Knee Immobilizer - Left: On when out of bed or walking;Discontinue once straight leg raise with < 10 degree lag Restrictions Weight Bearing Restrictions: No Other Position/Activity Restrictions: WBAT    Mobility  Bed Mobility Overal bed mobility: Needs Assistance Bed Mobility: Sit to Supine       Sit to supine: Min guard   General bed mobility comments: cues for sequence and use of R LE to self assist  Transfers Overall transfer level: Needs assistance Equipment used: Rolling walker (2 wheeled) Transfers: Sit to/from Stand Sit to Stand: Min guard         General transfer comment: cues for LE management and use of UEs to self assist  Ambulation/Gait Ambulation/Gait assistance: Min guard Gait Distance (Feet): 75 Feet Assistive device: Rolling walker (2 wheeled) Gait Pattern/deviations: Step-to pattern;Decreased step length - right;Decreased step length - left;Shuffle;Trunk flexed Gait velocity: decr   General Gait Details: cues for sequence, posture and position from [employer]             Wheelchair Mobility    Modified Rankin (Stroke Patients Only)       Balance Overall balance assessment: Mild deficits observed, not formally tested                                          Cognition Arousal/Alertness: Awake/alert Behavior During Therapy: WFL for tasks  assessed/performed Overall Cognitive Status: Within Functional Limits for tasks assessed                                        Exercises Total Joint Exercises Ankle Circles/Pumps: AROM;Both;15 reps;Supine Quad Sets: AROM;Both;10 reps;Supine Heel Slides: AAROM;Left;15 reps;Supine Straight Leg Raises: AAROM;AROM;Left;15 reps;Supine Goniometric ROM: AAROM L knee -10 - 60    General Comments        Pertinent Vitals/Pain Pain Assessment: 0-10 Pain Score: 5  Pain Location: L knee Pain Descriptors / Indicators: Aching;Sore Pain Intervention(s): Limited activity within patient's tolerance;Monitored during session;Premedicated before session;Ice applied    Home Living                      Prior Function            PT Goals (current goals can now be found in the care plan section) Acute Rehab PT Goals Patient Stated Goal: Regain IND PT Goal Formulation: With patient Time For Goal Achievement: 09/07/18 Potential to Achieve Goals: Good Progress towards PT goals: Progressing toward goals    Frequency    7X/week      PT Plan Current plan remains appropriate    Co-evaluation              AM-PAC  PT "6 Clicks" Mobility   Outcome Measure  Help needed turning from your back to your side while in a flat bed without using bedrails?: A Little Help needed moving from lying on your back to sitting on the side of a flat bed without using bedrails?: A Little Help needed moving to and from a bed to a chair (including a wheelchair)?: A Little Help needed standing up from a chair using your arms (e.g., wheelchair or bedside chair)?: A Little Help needed to walk in hospital room?: A Little Help needed climbing 3-5 steps with a railing? : A Little 6 Click Score: 18    End of Session Equipment Utilized During Treatment: Gait belt Activity Tolerance: Patient tolerated treatment well Patient left: with call bell/phone within reach;in bed Nurse  Communication: Mobility status PT Visit Diagnosis: Difficulty in walking, not elsewhere classified (R26.2)     Time: 5361-4431 PT Time Calculation (min) (ACUTE ONLY): 22 min  Charges:  $Gait Training: 8-22 mins $Therapeutic Exercise: 8-22 mins                     [place] Pager [phone] Office [phone]    Alyssah Algeo 09/01/2018, 4:09 PM

## 2018-09-01 NOTE — Progress Notes (Signed)
    Patient doing well Postop day 1 status post left total knee replacement by Dr. Berenice Primas.  His pain is well controlled.  He does have concern as he lives alone and is planning on going to [facility] facility upon discharge.  Social work is involved. He has been up and walking and is awaiting physical therapy today.  She denies nausea or vomiting.  She has positive bowel sounds.  She is eating normally and urinating without difficulty   Physical Exam: Vitals:   09/01/18 0342 09/01/18 1025  BP: 117/71 135/76  Pulse: 68 64  Resp: 16 18  Temp: (!) 97.4 F (36.3 C) 97.8 F (36.6 C)  SpO2: 99% 100%   CBC Latest Ref Rng & Units 09/01/2018 08/21/2018 06/19/2017  WBC 4.0 - 10.5 K/uL 10.3 5.4 5.9  Hemoglobin 13.0 - 17.0 g/dL 11.4(L) 14.6 14.1  Hematocrit 39.0 - 52.0 % 35.7(L) 44.8 42.6  Platelets 150 - 400 K/uL 220 221 335.0   BMP Latest Ref Rng & Units 08/21/2018 06/09/2017 06/09/2016  Glucose 70 - 99 mg/dL 96 139(H) 108(H)  BUN 8 - 23 mg/dL 13 15 21   Creatinine 0.61 - 1.24 mg/dL 0.93 0.88 0.82  Sodium 135 - 145 mmol/L 137 133(L) 139  Potassium 3.5 - 5.1 mmol/L 3.9 3.7 4.1  Chloride 98 - 111 mmol/L 102 101 103  CO2 22 - 32 mmol/L 25 22 30   Calcium 8.9 - 10.3 mg/dL 9.4 9.1 9.9    Dressing in place, CDI, Knee immobilizer in place patient is resting comfortably in bed with ice in place.  Distal compartments are soft, 2+ distal pedal pulses, normal mood and affect. NVI  POD #1 s/p Left total knee replacement by Dr. Berenice Primas team doing well  - Patient lives alone and would like to go to skilled nursing facility, he is requesting CLAPPs  - up with PT/OT, encourage ambulation - Continue current pain regimen as patient is tolerating well  - Scripts are signed and in the patient's chart awaiting discharge - ASA 325 twice a day for DVT prophylaxis 1 month - likely d/c to SNF when bed is available, likely Monday with f/u in 2 weeks

## 2018-09-02 DIAGNOSIS — M1712 Unilateral primary osteoarthritis, left knee: Secondary | ICD-10-CM | POA: Diagnosis not present

## 2018-09-02 LAB — CBC
HEMATOCRIT: 31 % — AB (ref 39.0–52.0)
Hemoglobin: 9.8 g/dL — ABNORMAL LOW (ref 13.0–17.0)
MCH: 32.1 pg (ref 26.0–34.0)
MCHC: 31.6 g/dL (ref 30.0–36.0)
MCV: 101.6 fL — ABNORMAL HIGH (ref 80.0–100.0)
Platelets: 203 10*3/uL (ref 150–400)
RBC: 3.05 MIL/uL — ABNORMAL LOW (ref 4.22–5.81)
RDW: 12.5 % (ref 11.5–15.5)
WBC: 9.1 10*3/uL (ref 4.0–10.5)
nRBC: 0 % (ref 0.0–0.2)

## 2018-09-02 MED ORDER — SODIUM CHLORIDE 0.9 % IV SOLN
25.0000 mg | Freq: Four times a day (QID) | INTRAVENOUS | Status: DC | PRN
  Administered 2018-09-02 (×2): 25 mg via INTRAVENOUS
  Filled 2018-09-02 (×2): qty 1

## 2018-09-02 NOTE — Progress Notes (Signed)
Physical Therapy Treatment Patient Details Name: Justin [name] MRN: [id number] DOB: Aug 20, 1946 Today's Date: 09/02/2018    History of Present Illness Pt s/p L TKR and with hx of CAD, PTSD and bipolar    PT Comments    Pt  Progressing toward goals; continue to recommend SNF post acute as planned; will follow    Follow Up Recommendations  SNF     Equipment Recommendations  Rolling walker with 5" wheels    Recommendations for Other Services       Precautions / Restrictions Precautions Precautions: Fall;Knee Precaution Comments: IND s SLRs Restrictions Weight Bearing Restrictions: No Other Position/Activity Restrictions: WBAT    Mobility  Bed Mobility Overal bed mobility: Needs Assistance Bed Mobility: Supine to Sit;Sit to Supine     Supine to sit: Min assist Sit to supine: Min guard   General bed mobility comments: cues for sequence and use of R LE to self assist  Transfers Overall transfer level: Needs assistance Equipment used: Rolling walker (2 wheeled) Transfers: Sit to/from Stand Sit to Stand: Min guard         General transfer comment: cues for LE management and use of UEs to self assist  Ambulation/Gait Ambulation/Gait assistance: Min guard Gait Distance (Feet): (hallway ambulation) Assistive device: Rolling walker (2 wheeled) Gait Pattern/deviations: Step-to pattern;Decreased step length - right;Decreased step length - left;Trunk flexed Gait velocity: decr   General Gait Details: cues for sequence, posture and position from RW, min/gaurd for balance and safety   Stairs             Wheelchair Mobility    Modified Rankin (Stroke Patients Only)       Balance                                            Cognition Arousal/Alertness: Awake/alert Behavior During Therapy: WFL for tasks assessed/performed Overall Cognitive Status: Within Functional Limits for tasks assessed                                         Exercises Total Joint Exercises Ankle Circles/Pumps: AROM;Both;15 reps;Supine Quad Sets: AROM;Both;10 reps;Supine Heel Slides: AAROM;Left;15 reps;Supine Hip ABduction/ADduction: AROM;AAROM;Left;10 reps Straight Leg Raises: AAROM;AROM;Left;15 reps;Supine Goniometric ROM: -5* to 80* AAROM L knee flexion    General Comments        Pertinent Vitals/Pain Pain Assessment: 0-10 Pain Score: 5  Pain Location: L knee Pain Descriptors / Indicators: Aching;Sore Pain Intervention(s): Limited activity within patient's tolerance;Monitored during session;Repositioned;Ice applied;Premedicated before session    Home Living                      Prior Function            PT Goals (current goals can now be found in the care plan section) Acute Rehab PT Goals Patient Stated Goal: Regain IND PT Goal Formulation: With patient Time For Goal Achievement: 09/07/18 Potential to Achieve Goals: Good Progress towards PT goals: Progressing toward goals    Frequency    7X/week      PT Plan Current plan remains appropriate    Co-evaluation              AM-PAC PT "6 Clicks" Mobility   Outcome Measure  Help needed turning from your  back to your side while in a flat bed without using bedrails?: A Little Help needed moving from lying on your back to sitting on the side of a flat bed without using bedrails?: A Little Help needed moving to and from a bed to a chair (including a wheelchair)?: A Little Help needed standing up from a chair using your arms (e.g., wheelchair or bedside chair)?: A Little Help needed to walk in hospital room?: A Little Help needed climbing 3-5 steps with a railing? : A Little 6 Click Score: 18    End of Session Equipment Utilized During Treatment: Gait belt Activity Tolerance: Patient tolerated treatment well Patient left: in bed;with call bell/phone within reach;with bed alarm set Nurse Communication: Mobility status PT Visit Diagnosis:  Difficulty in walking, not elsewhere classified (R26.2)     Time: 2641-5830 PT Time Calculation (min) (ACUTE ONLY): 23 min  Charges:  $Gait Training: 8-22 mins $Therapeutic Exercise: 8-22 mins                     Kenyon Ana, PT  Pager: [phone] Acute Rehab Dept [facility]): [phone]   09/02/2018    [facility] 09/02/2018, 2:18 PM

## 2018-09-02 NOTE — Progress Notes (Signed)
   Patient doing well Postop day 2 status post left total knee replacement by Dr. Berenice Primas.  His pain is well controlled.  He does have concern as he lives alone and is planning on going to [facility] facility upon discharge.  Social work is involved. He has been up and walking and making steady progress in physical therapy.  He denies nausea or vomiting.  He has positive bowel sounds.  He is eating normally and urinating without difficulty. He does complain of hiccups that have been ongoing since surgery.   Physical Exam: BP 109/66 (BP Location: Right Arm)   Pulse 67   Temp 97.9 F (36.6 C) (Oral)   Resp 17   Ht 5\' 10"  (1.778 m)   Wt 77 kg   SpO2 96%   BMI 24.36 kg/m   CBC Latest Ref Rng & Units 09/02/2018 09/01/2018 08/21/2018  WBC 4.0 - 10.5 K/uL 9.1 10.3 5.4  Hemoglobin 13.0 - 17.0 g/dL 9.8(L) 11.4(L) 14.6  Hematocrit 39.0 - 52.0 % 31.0(L) 35.7(L) 44.8  Platelets 150 - 400 K/uL 203 220 221   BMP Latest Ref Rng & Units 08/21/2018 06/09/2017 06/09/2016  Glucose 70 - 99 mg/dL 96 139(H) 108(H)  BUN 8 - 23 mg/dL 13 15 21   Creatinine 0.61 - 1.24 mg/dL 0.93 0.88 0.82  Sodium 135 - 145 mmol/L 137 133(L) 139  Potassium 3.5 - 5.1 mmol/L 3.9 3.7 4.1  Chloride 98 - 111 mmol/L 102 101 103  CO2 22 - 32 mmol/L 25 22 30   Calcium 8.9 - 10.3 mg/dL 9.4 9.1 9.9    Dressing in place, CDI, Knee immobilizer in place patient is resting comfortably in bed with ice in place.  Distal compartments are soft, 2+ distal pedal pulses, normal mood and affect. NVI  POD #2 s/p Left total knee replacement by Dr. Berenice Primas team doing well  - Patient lives alone and would like to go to skilled nursing facility, he is requesting CLAPPs  - up with PT/OT, encourage ambulation - Continue current pain regimen as patient is tolerating well             - Scripts are signed and in the patient's chart awaiting discharge - ASA 325 twice a day for DVT prophylaxis 1 month - Hiccups, will give medication  today - likely d/c to SNF when bed is available, likely Monday with f/u in 2 weeks

## 2018-09-03 DIAGNOSIS — D62 Acute posthemorrhagic anemia: Secondary | ICD-10-CM

## 2018-09-03 DIAGNOSIS — M1712 Unilateral primary osteoarthritis, left knee: Secondary | ICD-10-CM | POA: Diagnosis not present

## 2018-09-03 LAB — CBC
HCT: 29.3 % — ABNORMAL LOW (ref 39.0–52.0)
HEMOGLOBIN: 9.3 g/dL — AB (ref 13.0–17.0)
MCH: 31.5 pg (ref 26.0–34.0)
MCHC: 31.7 g/dL (ref 30.0–36.0)
MCV: 99.3 fL (ref 80.0–100.0)
Platelets: 191 10*3/uL (ref 150–400)
RBC: 2.95 MIL/uL — ABNORMAL LOW (ref 4.22–5.81)
RDW: 12.5 % (ref 11.5–15.5)
WBC: 7.1 10*3/uL (ref 4.0–10.5)
nRBC: 0 % (ref 0.0–0.2)

## 2018-09-03 NOTE — Progress Notes (Signed)
Physical Therapy Treatment Patient Details Name: Justin [name] MRN: [id number] DOB: [birth year] Today's Date: 09/03/2018    History of Present Illness Pt s/p L TKR and with hx of CAD, PTSD and bipolar    PT Comments    POD # 3 am session Assisted OOB. Demonstrated and instructed pt how to use belt to self assist LE as a leg lifter Assisted with amb a limited distance due to increased pain.  Very unsteady gait with limited WBing tolerance L LE.  Required assist with turns and backward gait to recliner.  HIGH FALL RISK.  Assisted to recliner and Performed some TE's following HEP handout.  Instructed on proper tech, freq as well as use of ICE.   Pt liives home alone and will need ST Rehab at SNF prior to D/C back to home.   Follow Up Recommendations  SNF     Equipment Recommendations  Rolling walker with 5" wheels    Recommendations for Other Services       Precautions / Restrictions Precautions Precautions: Fall;Knee Precaution Comments: able to perform SLR with assist from belt Restrictions Weight Bearing Restrictions: No Other Position/Activity Restrictions: WBAT    Mobility  Bed Mobility Overal bed mobility: Needs Assistance Bed Mobility: Supine to Sit     Supine to sit: Min assist     General bed mobility comments: demonstared and instructed how to use a belt to self assist LE off bed   Transfers Overall transfer level: Needs assistance Equipment used: Rolling walker (2 wheeled) Transfers: Sit to/from Stand Sit to Stand: Min guard         General transfer comment: 25% cues for LE management and use of UEs to self assist  Ambulation/Gait Ambulation/Gait assistance: Min guard Gait Distance (Feet): 22 Feet Assistive device: Rolling walker (2 wheeled) Gait Pattern/deviations: Step-to pattern;Decreased step length - right;Decreased step length - left;Trunk flexed Gait velocity: decreased    General Gait Details: cues for sequence, posture and position from  RW, min/gaurd for balance and safety.  Unsteady.  HIGH FALL RISK   Stairs             Wheelchair Mobility    Modified Rankin (Stroke Patients Only)       Balance                                            Cognition Arousal/Alertness: Awake/alert Behavior During Therapy: WFL for tasks assessed/performed Overall Cognitive Status: Within Functional Limits for tasks assessed                                        Exercises   Total Knee Replacement TE's 10 reps B LE ankle pumps 10 reps towel squeezes 10 reps knee presses 10 reps heel slides  10 reps SAQ's 10 reps SLR's 10 reps ABD Followed by ICE     General Comments        Pertinent Vitals/Pain Pain Assessment: 0-10 Pain Score: 8  Pain Location: L knee Pain Descriptors / Indicators: Aching;Sore;Operative site guarding Pain Intervention(s): Monitored during session;Repositioned;Ice applied;Premedicated before session    Home Living                      Prior Function  PT Goals (current goals can now be found in the care plan section) Progress towards PT goals: Progressing toward goals    Frequency    7X/week      PT Plan Current plan remains appropriate    Co-evaluation              AM-PAC PT "6 Clicks" Mobility   Outcome Measure  Help needed turning from your back to your side while in a flat bed without using bedrails?: A Little Help needed moving from lying on your back to sitting on the side of a flat bed without using bedrails?: A Little Help needed moving to and from a bed to a chair (including a wheelchair)?: A Little Help needed standing up from a chair using your arms (e.g., wheelchair or bedside chair)?: A Little Help needed to walk in hospital room?: A Little Help needed climbing 3-5 steps with a railing? : A Lot 6 Click Score: 17    End of Session Equipment Utilized During Treatment: Gait belt Activity Tolerance:  Patient tolerated treatment well Patient left: in chair;with call bell/phone within reach Nurse Communication: Mobility status PT Visit Diagnosis: Difficulty in walking, not elsewhere classified (R26.2)     Time: 3888-7579 PT Time Calculation (min) (ACUTE ONLY): 24 min  Charges:  $Gait Training: 8-22 mins $Therapeutic Exercise: 8-22 mins                     Rica Koyanagi  PTA Acute  Rehabilitation Services Pager      [phone] Office      [phone]

## 2018-09-03 NOTE — Progress Notes (Signed)
Subjective: 3 Days Post-Op Procedure(s) (LRB): TOTAL KNEE ARTHROPLASTY (Left) Patient reports pain as moderate.  Ambulating in hall.  Taking by mouth and voiding okay.  Eyes dizziness or lightheadedness.  Objective: Vital signs in last 24 hours: Temp:  [97.8 F (36.6 C)-98 F (36.7 C)] 98 F (36.7 C) (01/12 2120) Pulse Rate:  [67-74] 67 (01/12 2120) Resp:  [16] 16 (01/12 2120) BP: (110-140)/(69-89) 140/89 (01/12 2120) SpO2:  [97 %-98 %] 98 % (01/12 2120)  Intake/Output from previous day: 01/12 0701 - 01/13 0700 In: 510 [P.O.:510] Out: 2750 [Urine:2750] Intake/Output this shift: No intake/output data recorded.  Recent Labs    09/01/18 0335 09/02/18 0315 09/03/18 0353  HGB 11.4* 9.8* 9.3*   Recent Labs    09/02/18 0315 09/03/18 0353  WBC 9.1 7.1  RBC 3.05* 2.95*  HCT 31.0* 29.3*  PLT 203 191   No results for input(s): NA, K, CL, CO2, BUN, CREATININE, GLUCOSE, CALCIUM in the last 72 hours. No results for input(s): LABPT, INR in the last 72 hours. Left knee exam: Neurovascular intact Sensation intact distally Intact pulses distally Dorsiflexion/Plantar flexion intact Incision: dressing C/D/I Compartment soft  Anticipated LOS equal to or greater than 2 midnights due to - Age 33 and older with one or more of the following:  - Obesity  - Expected need for hospital services (PT, OT, Nursing) required for safe  discharge  - Anticipated need for postoperative skilled nursing care or inpatient rehab  - Active co-morbidities: None OR   - Unanticipated findings during/Post Surgery: Slow post-op progression: GI, pain control, mobility  - Patient is a high risk of re-admission due to: Barriers to post-acute care (logistical, no family support in home)   Assessment/Plan: 3 Days Post-Op Procedure(s) (LRB): TOTAL KNEE ARTHROPLASTY (Left) Acute blood loss anemia, asymptomatic., Plan Up with therapy Discharge to SNF today. Continue Plavix which she was on  preoperatively for DVT prophylaxis. Prescriptions written and in chart. Follow-up with Dr. Berenice Primas on January 20.    Erlene Senters 09/03/2018, 8:22 AM

## 2018-09-03 NOTE — Clinical Social Work Placement (Signed)
D/C Summary sent.  Nurse given the number to call report.  [facility] called to transport.   CLINICAL SOCIAL WORK PLACEMENT  NOTE  Date:  09/03/2018  Patient Details  Name: Justin [name] MRN: [id number] Date of Birth: February 25, 1946  Clinical Social Work is seeking post-discharge placement for this patient at the [place] level of care (*CSW will initial, date and re-position this form in  chart as items are completed):  Yes   Patient/family provided with [place] Work Department's list of facilities offering this level of care within the geographic area requested by the patient (or if unable, by the patient's family).  Yes   Patient/family informed of their freedom to choose among providers that offer the needed level of care, that participate in Medicare, Medicaid or managed care program needed by the patient, have an available bed and are willing to accept the patient.      Patient/family informed of [facility]'s ownership interest in [facility] and [facility], as well as of the fact that they are under no obligation to receive care at these facilities.  PASRR submitted to EDS on       PASRR number received on       Existing PASRR number confirmed on       FL2 transmitted to all facilities in geographic area requested by pt/family on       FL2 transmitted to all facilities within larger geographic area on 09/03/18     Patient informed that his/her managed care company has contracts with or will negotiate with certain facilities, including the following:  [facility], [facility]     Yes   Patient/family informed of bed offers received.  Patient chooses bed at [facility] [place], [place]     Physician recommends and patient chooses bed at      Patient to be transferred to [facility] on 09/03/18.  Patient to be transferred to facility by PTAR     Patient family notified on 09/03/18 of transfer.  Name of family member notified:   Patient to notify son     PHYSICIAN       Additional Comment:    _______________________________________________ Lia Hopping, [place] 09/03/2018, 3:29 PM

## 2018-09-03 NOTE — Discharge Summary (Signed)
Patient ID: Justin [name] MRN: [id number] DOB/AGE: Mar 09, 1946 73 y.o.  Admit date: 08/31/2018 Discharge date: 09/03/2018  Admission Diagnoses:  Principal Problem:   Primary osteoarthritis of left knee Active Problems:   Postoperative anemia due to acute blood loss   Discharge Diagnoses:  Same  Past Medical History:  Diagnosis Date  . Anxiety and depression   . Arthritis    knees, c-spine // gets lumbar ESI q 6 mos  . Bipolar disorder ([place])   . CAD (coronary artery disease)    [place] 08/2009 >> LHC ([facility]) - mLAD 70, Dx 65 >> PCI:  3x15 mm Endeavor DES to mLAD and 2.5x12 mm Endeavor DES to Dx // S/p NSTEMI >> LHC 8/12 ([facility]) - LM ok, LAD prox and mid 40; LAD stent ok, Dx stent ok, mRCA 30, mLCx 50 >> med Rx // ETT-Echo 2/17 ([facility]): Normal, EF 55-60 at rest  . Chicken pox   . 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 Grief   . History of echocardiogram    Echo 3/19: Mild concentric LVH, EF 60-65, normal wall motion, grade 1 diastolic dysfunction, mild AI, mildly dilated aortic root (40 mm), MAC, mild LAE, atrial septal lipomatous hypertrophy  . History of non-ST elevation myocardial infarction (NSTEMI)   . History of nuclear stress test    Nuclear stress test 3/19: EF 57, inferior/inferoseptal/inferolateral defect consistent with probable soft tissue attenuation (cannot exclude subendocardial scar), no ischemia, intermediate risk >> Echo 3/19 normal EF, normal wall motion  . History of pneumonia 2011  . History of prostatitis 1990s  . Hx of adenomatous colonic polyps 06/22/2016  . Hyperlipidemia   . Hypertension   . Migraines    prior history  . PTSD (post-traumatic stress disorder)     Surgeries: Procedure(s): Left TOTAL KNEE ARTHROPLASTY on 08/31/2018   Consultants:   Discharged Condition: Improved  Hospital Course: Justin [name] is an 73 y.o. male who was admitted 08/31/2018 for operative treatment ofPrimary osteoarthritis of left knee. Patient has severe  unremitting pain that affects sleep, daily activities, and work/hobbies. After pre-op clearance the patient was taken to the operating room on 08/31/2018 and underwent  Procedure(s): Left TOTAL KNEE ARTHROPLASTY.    Patient was given perioperative antibiotics:  Anti-infectives (From admission, onward)   Start     Dose/Rate Route Frequency Ordered Stop   08/31/18 1400  ceFAZolin (ANCEF) IVPB 2g/100 mL premix     2 g 200 mL/hr over 30 Minutes Intravenous Every 6 hours 08/31/18 1031 08/31/18 2025   08/31/18 0600  ceFAZolin (ANCEF) IVPB 2g/100 mL premix     2 g 200 mL/hr over 30 Minutes Intravenous On call to O.R. 08/31/18 0543 08/31/18 0756       Patient was given sequential compression devices, early ambulation, and chemoprophylaxis to prevent DVT.  Patient benefited maximally from hospital stay and there were no complications.    Recent vital signs:  Patient Vitals for the past 24 hrs:  BP Temp Temp src Pulse Resp SpO2  09/02/18 2120 140/89 98 F (36.7 C) Oral 67 16 98 %  09/02/18 1354 110/69 97.8 F (36.6 C) - 74 16 97 %     Recent laboratory studies:  Recent Labs    09/02/18 0315 09/03/18 0353  WBC 9.1 7.1  HGB 9.8* 9.3*  HCT 31.0* 29.3*  PLT 203 191     Discharge Medications:   Allergies as of 09/03/2018      Reactions   Ambien [zolpidem Tartrate] Other (  See Comments)   Blackout, memory issues   Seroquel [quetiapine]    SEVERE NIGHTMARES, SLEEPWALK AND NIGHT DRIVE WITH NO RECOLLECTION UPON WAKENING      Medication List    STOP taking these medications   acetaminophen-codeine 300-30 MG tablet Commonly known as:  TYLENOL #3     TAKE these medications   ALPRAZolam 0.5 MG tablet Commonly known as:  XANAX Take 1 tablet (0.5 mg total) by mouth 2 (two) times daily as needed for anxiety.   ARIPiprazole 5 MG tablet Commonly known as:  ABILIFY Take 1 tablet (5 mg total) by mouth daily. What changed:  when to take this   clopidogrel 75 MG tablet Commonly known  as:  PLAVIX Take 1 tablet (75 mg total) by mouth daily.   diltiazem 180 MG 24 hr capsule Commonly known as:  CARTIA XT Take 1 capsule (180 mg total) by mouth daily.   docusate sodium 100 MG capsule Commonly known as:  COLACE Take 1 capsule (100 mg total) by mouth 2 (two) times daily.   doxepin 25 MG capsule Commonly known as:  SINEQUAN Take 1 capsule (25 mg total) by mouth at bedtime as needed. What changed:  reasons to take this   lamoTRIgine 150 MG tablet Commonly known as:  LAMICTAL Take 2 tablets (300 mg total) by mouth daily. For mood control What changed:  how much to take   nitroGLYCERIN 0.4 MG SL tablet Commonly known as:  NITROSTAT Place 1 tablet (0.4 mg total) under the tongue every 5 (five) minutes as needed for chest pain.   oxyCODONE-acetaminophen 5-325 MG tablet Commonly known as:  PERCOCET/ROXICET Take 1-2 tablets by mouth every 6 (six) hours as needed for severe pain.   rosuvastatin 20 MG tablet Commonly known as:  CRESTOR Take 1 tablet (20 mg total) by mouth daily.   tiZANidine 2 MG tablet Commonly known as:  ZANAFLEX Take 1 tablet (2 mg total) by mouth every 8 (eight) hours as needed for muscle spasms.            Discharge Care Instructions  (From admission, onward)         Start     Ordered   09/03/18 0000  Weight bearing as tolerated    Question Answer Comment  Laterality left   Extremity Lower      09/03/18 0824          Diagnostic Studies: No results found.  Disposition: Discharge disposition: 03-Skilled Nursing Facility       Discharge Instructions    CPM   Complete by:  As directed    Continuous passive motion machine (CPM):      Use the CPM from 0 to 60 for 8 hours per day.      You may increase by 5-10 per day.  You may break it up into 2 or 3 sessions per day.      Use CPM for 1-2 weeks or until you are told to stop.   Call MD / Call 911   Complete by:  As directed    If you experience chest pain or shortness  of breath, CALL 911 and be transported to the hospital emergency room.  If you develope a fever above 101 F, pus (white drainage) or increased drainage or redness at the wound, or calf pain, call your surgeon's office.   Constipation Prevention   Complete by:  As directed    Drink plenty of fluids.  Prune juice may be helpful.  You may use a stool softener, such as Colace (over the counter) 100 mg twice a day.  Use MiraLax (over the counter) for constipation as needed.   Diet general   Complete by:  As directed    Do not put a pillow under the knee. Place it under the heel.   Complete by:  As directed    Increase activity slowly as tolerated   Complete by:  As directed    Weight bearing as tolerated   Complete by:  As directed    Laterality:  left   Extremity:  Lower      Follow-up Information    Dorna Leitz, MD. Schedule an appointment as soon as possible for a visit in 2 weeks.   Specialty:  Orthopedic Surgery Contact information: [place] [address] [phone]            Signed: Erlene Senters 09/03/2018, 8:25 AM

## 2018-09-03 NOTE — Progress Notes (Signed)
Report called to renee at [facility]  [place]

## 2018-09-04 ENCOUNTER — Encounter (HOSPITAL_COMMUNITY): Payer: Self-pay | Admitting: Orthopedic Surgery

## 2018-09-14 ENCOUNTER — Other Ambulatory Visit (HOSPITAL_COMMUNITY): Payer: Self-pay

## 2018-09-14 DIAGNOSIS — F411 Generalized anxiety disorder: Secondary | ICD-10-CM

## 2018-09-14 MED ORDER — ALPRAZOLAM 0.5 MG PO TABS: 0.5000 mg | ORAL_TABLET | Freq: Two times a day (BID) | ORAL | 0 refills | Status: DC | PRN

## 2018-09-17 ENCOUNTER — Inpatient Hospital Stay: Payer: Self-pay | Admitting: Internal Medicine

## 2018-09-19 ENCOUNTER — Ambulatory Visit (HOSPITAL_COMMUNITY): Payer: Medicare Other | Admitting: Licensed Clinical Social Worker

## 2018-09-19 ENCOUNTER — Encounter (HOSPITAL_COMMUNITY): Payer: Self-pay | Admitting: Licensed Clinical Social Worker

## 2018-09-19 DIAGNOSIS — F4321 Adjustment disorder with depressed mood: Secondary | ICD-10-CM

## 2018-09-19 DIAGNOSIS — F3162 Bipolar disorder, current episode mixed, moderate: Secondary | ICD-10-CM | POA: Diagnosis not present

## 2018-09-19 NOTE — Progress Notes (Signed)
   THERAPIST PROGRESS NOTE  Session Time: 11:00am-11:55am  Participation Level: Active  Behavioral Response: Well GroomedAlertEuthymic  Type of Therapy: Individual Therapy  Treatment Goals addressed: improve psychiatric symptoms, Controlled Behavior, Moderated Mood, Deliberative Speech (improved social functioning), Improve Unhelpful Thought Patterns, Emotional Regulation Skills (Moderate moods, anger management, stress management), Feel and express a full Range of Emotions, Learn about Diagnosis, Healthy Coping Skills, Recall the Traumatic event without being overwhelmed    Interventions: CBT, Motivational Interviewing, grounding techniques and mindfulness techniques, and psychoeducation  Summary: Justin [name] is a 72 y.o. male who presents with Bipolar 1 Disorder, Mixed, Moderate and Grief  Suicidal/Homicidal: No - without intent/plan  Therapist Response:  Justin [name] met with clinician for an individual session. [name] discussed his psychiatric symptoms, his current life events and his homework. [name] provided details about his recent knee replacement surgery, preparation, and rehab. Clinician processed these experiences and noted that he has been doing well, but needs to continue working on his physical therapy exercises to improve weight bearing and gait. Clinician explored and processed mood and attitude coming in to 2020. [name] reports feeling ready to "let go" of the past and to be present in the now. [name] discussed experience of visualizing "emptying the dump truck" of his mind into a bonfire on New Years Eve, and ever since then, he has been feeling more positive. Clinician addressed feelings of guilt over not grieving. Clinician provided feedback about being able to live a happy life while not feeling guilty that loved ones are not there to share it with him.   Plan: Return again in 3-4 weeks.  Diagnosis:     Axis I: Bipolar 1 Disorder, Mixed, Moderate and Grief   R  , LCSW 09/19/2018  

## 2018-09-24 ENCOUNTER — Ambulatory Visit (INDEPENDENT_AMBULATORY_CARE_PROVIDER_SITE_OTHER): Payer: Medicare Other | Admitting: Psychiatry

## 2018-09-24 ENCOUNTER — Encounter (HOSPITAL_COMMUNITY): Payer: Self-pay | Admitting: Psychiatry

## 2018-09-24 DIAGNOSIS — F4310 Post-traumatic stress disorder, unspecified: Secondary | ICD-10-CM | POA: Diagnosis not present

## 2018-09-24 DIAGNOSIS — F3162 Bipolar disorder, current episode mixed, moderate: Secondary | ICD-10-CM | POA: Diagnosis not present

## 2018-09-24 DIAGNOSIS — F411 Generalized anxiety disorder: Secondary | ICD-10-CM

## 2018-09-24 MED ORDER — LAMOTRIGINE 200 MG PO TABS: 200.0000 mg | ORAL_TABLET | Freq: Every day | ORAL | 1 refills | Status: DC

## 2018-09-24 MED ORDER — ARIPIPRAZOLE 5 MG PO TABS: 5.0000 mg | ORAL_TABLET | Freq: Every day | ORAL | 2 refills | Status: DC

## 2018-09-24 MED ORDER — GABAPENTIN 100 MG PO CAPS: ORAL_CAPSULE | ORAL | 1 refills | Status: DC

## 2018-09-24 NOTE — Progress Notes (Signed)
[place] MD/PA/NP OP Progress Note  09/24/2018 8:42 AM Justin [name]  MRN:  [id number]  Chief Complaint: I have a lot of pain in my knee joint.  I still struggling after the surgery with pain.  HPI: Justin [name] came for his follow-up appointment.  Recently had left knee surgery and he is still in a phase of recovery.  He is complaining of a lot of pain which is not helping at night for sleep.  Despite taking doxepin he is only sleeping 1 to 2 hours.  I reviewed his medication.  He told that he is taking Lamictal 1 tablet 150 mg.  He is not sure why is not taking 300.  Patient spent [facility] for [facility] therapy.  He is also not happy with his son who did not help him as much.  Patient told he only calls when he need money or his truck.  He admitted lately more isolated, feeling withdrawn and depressed but denies any suicidal thoughts or homicidal thought.  He takes Xanax 2 times a week when he is very nervous and have panic attack.  Today's appointment to see his pain management.  Patient told in past 2 weeks they have tried multiple pain medication but it seems nothing is working.  He denies any side effects of the medication.  He thinks he may need another week or 2 of rehab for physical therapy.  Appetite is okay.  His nightmares and flashback is less intense but now is more concerned about insomnia and chronic pain.  He is seeing Justin [name] for therapy.  He has no rash, itching tremors or shakes.  His energy level is fair.  Visit Diagnosis:    ICD-10-CM   1. Bipolar 1 disorder, mixed, moderate (HCC) F31.62 ARIPiprazole (ABILIFY) 5 MG tablet    lamoTRIgine (LAMICTAL) 200 MG tablet  2. Generalized anxiety disorder F41.1 lamoTRIgine (LAMICTAL) 200 MG tablet    gabapentin (NEURONTIN) 100 MG capsule  3. PTSD (post-traumatic stress disorder) F43.10 lamoTRIgine (LAMICTAL) 200 MG tablet    gabapentin (NEURONTIN) 100 MG capsule    Past Psychiatric History: Reviewed. H/O suicidal attempt by  taking overdose on Ambien, hydroxyzine and alcohol. H/O bipolar disorder with multiple hospitalization. H/O IOP. Last admission in October 2018 at behavioral health center. Tried Zyprexa, Cymbalta, Seroquel, Vistaril, Ambien, Neurontin, Wellbutrin and Trileptal. Had a reaction with Ativan and pain medication. Saw Dr. Letta Moynahan in past.  Past Medical History:  Past Medical History:  Diagnosis Date  . Anxiety and depression   . Arthritis    knees, c-spine // gets lumbar ESI q 6 mos  . Bipolar disorder ([place])   . CAD (coronary artery disease)    [place] 08/2009 >> LHC ([facility]) - mLAD 70, Dx 65 >> PCI:  3x15 mm Endeavor DES to mLAD and 2.5x12 mm Endeavor DES to Dx // S/p NSTEMI >> LHC 8/12 ([facility]) - LM ok, LAD prox and mid 40; LAD stent ok, Dx stent ok, mRCA 30, mLCx 50 >> med Rx // ETT-Echo 2/17 ([facility]): Normal, EF 55-60 at rest  . Chicken pox   . 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 Grief   . History of echocardiogram    Echo 3/19: Mild concentric LVH, EF 60-65, normal wall motion, grade 1 diastolic dysfunction, mild AI, mildly dilated aortic root (40 mm), MAC, mild LAE, atrial septal lipomatous hypertrophy  . History of non-ST elevation myocardial infarction (NSTEMI)   . History of nuclear stress test  Nuclear stress test 3/19: EF 57, inferior/inferoseptal/inferolateral defect consistent with probable soft tissue attenuation (cannot exclude subendocardial scar), no ischemia, intermediate risk >> Echo 3/19 normal EF, normal wall motion  . History of pneumonia 2011  . History of prostatitis 1990s  . Hx of adenomatous colonic polyps 06/22/2016  . Hyperlipidemia   . Hypertension   . Migraines    prior history  . PTSD (post-traumatic stress disorder)     Past Surgical History:  Procedure Laterality Date  . CERVICAL DISCECTOMY    . COLONOSCOPY  03/2017  . ELBOW SURGERY Left   . ELBOW SURGERY Right   . INGUINAL HERNIA REPAIR Bilateral    1996, 1997  . KNEE ARTHROSCOPY Right    . KNEE CARTILAGE SURGERY Left   . NASAL SEPTUM SURGERY    . STENT PLACEMENT VASCULAR ([facility])  09/02/2010  . TONSILLECTOMY    . 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  Family History:  Family History  Problem Relation Age of Onset  . Arthritis Mother   . Heart disease Mother        s/p CABG  . Hypertension Mother   . Alzheimer's disease Mother   . Heart attack Mother   . Arthritis Father   . Hyperlipidemia Father   . Heart disease Father        s/p CABG  . Stroke Father   . Hypertension Father   . Heart attack Father   . Multiple sclerosis Sister   . Diabetes Sister   . Lung cancer Paternal Grandmother   . Diabetes Sister   . Heart attack Sister   . Pulmonary embolism Sister        died  . Lung cancer Maternal Aunt   . Lung cancer Paternal Aunt   . Depression Brother        suicide    Social History:  Social History   Socioeconomic History  . Marital status: Widowed    Spouse name: Not on file  . Number of children: 2  . Years of education: 23  . Highest education level: Not on file  Occupational History  . Occupation: Retired  Scientific laboratory technician  . Financial resource strain: Somewhat hard  . Food insecurity:    Worry: Sometimes true    Inability: Sometimes true  . Transportation needs:    Medical: No    Non-medical: No  Tobacco Use  . Smoking status: Never Smoker  . Smokeless tobacco: Never Used  Substance and Sexual Activity  . Alcohol use: No    Alcohol/week: 0.0 standard drinks  . Drug use: No  . Sexual activity: Not Currently  Lifestyle  . Physical activity:    Days per week: 0 days    Minutes per session: 0 min  . Stress: Rather much  Relationships  . Social connections:    Talks on phone: More than three times a week    Gets together: More than three times a week     Attends religious service: Never    Active member of club or organization: No    Attends meetings of clubs or organizations: Never    Relationship status: Widowed  Other Topics Concern  . Not on file  Social History Narrative   Retired, widowed in 2017    1 son / 1 daughter   2 caffeinated beverages  daily no alcohol or tobacco   Fun: Work out in the yard.   Denies religious beliefs effecting healthcare.    Worked at [employer] for 30 years    Allergies:  Allergies  Allergen Reactions  . Ambien [Zolpidem Tartrate] Other (See Comments)    Blackout, memory issues  . Seroquel [Quetiapine]     SEVERE NIGHTMARES, SLEEPWALK AND NIGHT DRIVE WITH NO RECOLLECTION UPON WAKENING    Metabolic Disorder Labs: Recent Results (from the past 2160 hour(s))  CBC with Differential/Platelet     Status: Abnormal   Collection Time: 08/21/18  2:48 PM  Result Value Ref Range   WBC 5.4 4.0 - 10.5 K/uL   RBC 4.47 4.22 - 5.81 MIL/uL   Hemoglobin 14.6 13.0 - 17.0 g/dL   HCT 44.8 39.0 - 52.0 %   MCV 100.2 (H) 80.0 - 100.0 fL   MCH 32.7 26.0 - 34.0 pg   MCHC 32.6 30.0 - 36.0 g/dL   RDW 12.5 11.5 - 15.5 %   Platelets 221 150 - 400 K/uL   nRBC 0.0 0.0 - 0.2 %   Neutrophils Relative % 71 %   Neutro Abs 3.9 1.7 - 7.7 K/uL   Lymphocytes Relative 17 %   Lymphs Abs 0.9 0.7 - 4.0 K/uL   Monocytes Relative 9 %   Monocytes Absolute 0.5 0.1 - 1.0 K/uL   Eosinophils Relative 1 %   Eosinophils Absolute 0.0 0.0 - 0.5 K/uL   Basophils Relative 1 %   Basophils Absolute 0.0 0.0 - 0.1 K/uL   Immature Granulocytes 1 %   Abs Immature Granulocytes 0.03 0.00 - 0.07 K/uL    Comment: Performed at [facility], [place] [address], [place], [place] [address]  Urinalysis, Routine w reflex microscopic     Status: Abnormal   Collection Time: 08/21/18  2:48 PM  Result Value Ref Range   Color, Urine YELLOW YELLOW   APPearance CLEAR CLEAR   Specific Gravity, Urine 1.014 1.005 - 1.030   pH 6.0 5.0 - 8.0    Glucose, UA NEGATIVE NEGATIVE mg/dL   Hgb urine dipstick NEGATIVE NEGATIVE   Bilirubin Urine NEGATIVE NEGATIVE   Ketones, ur 20 (A) NEGATIVE mg/dL   Protein, ur NEGATIVE NEGATIVE mg/dL   Nitrite NEGATIVE NEGATIVE   Leukocytes, UA NEGATIVE NEGATIVE    Comment: Performed at [place] [address], [place], [place] [address]  APTT     Status: None   Collection Time: 08/21/18  2:48 PM  Result Value Ref Range   aPTT 31 24 - 36 seconds    Comment: Performed at [facility], [place] [address] [place], [place] [address]  Comprehensive metabolic panel     Status: Abnormal   Collection Time: 08/21/18  2:48 PM  Result Value Ref Range   Sodium 137 135 - 145 mmol/L   Potassium 3.9 3.5 - 5.1 mmol/L   Chloride 102 98 - 111 mmol/L   CO2 25 22 - 32 mmol/L   Glucose, Bld 96 70 - 99 mg/dL   BUN 13 8 - 23 mg/dL   Creatinine, Ser 0.93 0.61 - 1.24 mg/dL   Calcium 9.4 8.9 - 10.3 mg/dL   Total Protein 7.2 6.5 - 8.1 g/dL   Albumin 4.8 3.5 - 5.0 g/dL   AST 31 15 - 41 U/L   ALT 19 0 - 44 U/L   Alkaline Phosphatase 52 38 - 126 U/L   Total Bilirubin 1.7 (H) 0.3 - 1.2 mg/dL  GFR calc non Af Amer >60 >60 mL/min   GFR calc Af Amer >60 >60 mL/min   Anion gap 10 5 - 15    Comment: Performed at [facility], [place] [address] [place], [place] [address]  Protime-INR     Status: None   Collection Time: 08/21/18  2:48 PM  Result Value Ref Range   Prothrombin Time 12.6 11.4 - 15.2 seconds   INR 0.95     Comment: Performed at [facility], [place] [address], [place], [place] [address]  Surgical pcr screen     Status: None   Collection Time: 08/21/18  4:07 PM  Result Value Ref Range   MRSA, PCR NEGATIVE NEGATIVE   Staphylococcus aureus NEGATIVE NEGATIVE    Comment: (NOTE) The Xpert SA Assay (FDA approved for NASAL specimens in patients 25 years of age and older), is one component of a comprehensive surveillance program. It is not  intended to diagnose infection nor to guide or monitor treatment. Performed at [facility], [place] [address] [place], [place] [address]   CBC     Status: Abnormal   Collection Time: 09/01/18  3:35 AM  Result Value Ref Range   WBC 10.3 4.0 - 10.5 K/uL   RBC 3.59 (L) 4.22 - 5.81 MIL/uL   Hemoglobin 11.4 (L) 13.0 - 17.0 g/dL   HCT 35.7 (L) 39.0 - 52.0 %   MCV 99.4 80.0 - 100.0 fL   MCH 31.8 26.0 - 34.0 pg   MCHC 31.9 30.0 - 36.0 g/dL   RDW 12.1 11.5 - 15.5 %   Platelets 220 150 - 400 K/uL   nRBC 0.0 0.0 - 0.2 %    Comment: Performed at [facility], [place] [address], [place], [place] [address]  CBC     Status: Abnormal   Collection Time: 09/02/18  3:15 AM  Result Value Ref Range   WBC 9.1 4.0 - 10.5 K/uL   RBC 3.05 (L) 4.22 - 5.81 MIL/uL   Hemoglobin 9.8 (L) 13.0 - 17.0 g/dL   HCT 31.0 (L) 39.0 - 52.0 %   MCV 101.6 (H) 80.0 - 100.0 fL   MCH 32.1 26.0 - 34.0 pg   MCHC 31.6 30.0 - 36.0 g/dL   RDW 12.5 11.5 - 15.5 %   Platelets 203 150 - 400 K/uL   nRBC 0.0 0.0 - 0.2 %    Comment: Performed at [facility], [place] [address] [place], [place] [address]  CBC     Status: Abnormal   Collection Time: 09/03/18  3:53 AM  Result Value Ref Range   WBC 7.1 4.0 - 10.5 K/uL   RBC 2.95 (L) 4.22 - 5.81 MIL/uL   Hemoglobin 9.3 (L) 13.0 - 17.0 g/dL   HCT 29.3 (L) 39.0 - 52.0 %   MCV 99.3 80.0 - 100.0 fL   MCH 31.5 26.0 - 34.0 pg   MCHC 31.7 30.0 - 36.0 g/dL   RDW 12.5 11.5 - 15.5 %   Platelets 191 150 - 400 K/uL   nRBC 0.0 0.0 - 0.2 %    Comment: Performed at [facility], [place] [address], [place], [place] [address]   Lab Results  Component Value Date   HGBA1C 4.5 (L) 06/19/2017   MPG 108 06/14/2017   Lab Results  Component Value Date   PROLACTIN 26.0 (H) 06/14/2017   Lab Results  Component Value Date   CHOL 150 06/14/2017   TRIG  116 06/14/2017   HDL 61 06/14/2017   CHOLHDL 2.5 06/14/2017   VLDL 23  06/14/2017   LDLCALC 66 06/14/2017   LDLCALC 55 03/26/2015   Lab Results  Component Value Date   TSH 1.189 06/14/2017   TSH 0.59 03/26/2015    Therapeutic Level Labs: No results found for: LITHIUM No results found for: VALPROATE No components found for:  CBMZ  Current Medications: Current Outpatient Medications  Medication Sig Dispense Refill  . ALPRAZolam (XANAX) 0.5 MG tablet Take 1 tablet (0.5 mg total) by mouth 2 (two) times daily as needed for anxiety. 60 tablet 0  . ARIPiprazole (ABILIFY) 5 MG tablet Take 1 tablet (5 mg total) by mouth daily. (Patient taking differently: Take 5 mg by mouth once a week. ) 30 tablet 2  . clopidogrel (PLAVIX) 75 MG tablet Take 1 tablet (75 mg total) by mouth daily. 30 tablet 11  . diltiazem (CARTIA XT) 180 MG 24 hr capsule Take 1 capsule (180 mg total) by mouth daily. 30 capsule 0  . docusate sodium (COLACE) 100 MG capsule Take 1 capsule (100 mg total) by mouth 2 (two) times daily. 30 capsule 0  . doxepin (SINEQUAN) 25 MG capsule Take 1 capsule (25 mg total) by mouth at bedtime as needed. (Patient taking differently: Take 25 mg by mouth at bedtime as needed (sleep). ) 30 capsule 1  . lamoTRIgine (LAMICTAL) 150 MG tablet Take 2 tablets (300 mg total) by mouth daily. For mood control (Patient taking differently: Take 150 mg by mouth daily. For mood control) 60 tablet 2  . nitroGLYCERIN (NITROSTAT) 0.4 MG SL tablet Place 1 tablet (0.4 mg total) under the tongue every 5 (five) minutes as needed for chest pain. 25 tablet 11  . oxyCODONE-acetaminophen (PERCOCET/ROXICET) 5-325 MG tablet Take 1-2 tablets by mouth every 6 (six) hours as needed for severe pain. 60 tablet 0  . rosuvastatin (CRESTOR) 20 MG tablet Take 1 tablet (20 mg total) by mouth daily. 30 tablet 11  . tiZANidine (ZANAFLEX) 2 MG tablet Take 1 tablet (2 mg total) by mouth every 8 (eight) hours as needed for muscle spasms. 50 tablet 0   No current facility-administered medications for this  visit.      Musculoskeletal: Strength & Muscle Tone: decreased Gait & Station: unsteady, use cain to help balance Patient leans: Left  Psychiatric Specialty Exam: Review of Systems  Musculoskeletal: Positive for joint pain.    Blood pressure 138/80, height 5' 11"  (1.803 m), weight 173 lb (78.5 kg).Body mass index is 24.13 kg/m.  General Appearance: Casual  Eye Contact:  Fair  Speech:  Normal Rate  Volume:  Normal  Mood:  Dysphoric  Affect:  Constricted and Depressed  Thought Process:  Goal Directed  Orientation:  Full (Time, Place, and Person)  Thought Content: Rumination   Suicidal Thoughts:  No  Homicidal Thoughts:  No  Memory:  Immediate;   Good Recent;   Good Remote;   Good  Judgement:  Good  Insight:  Good  Psychomotor Activity:  Normal  Concentration:  Concentration: Good and Attention Span: Fair  Recall:  [place] of Knowledge: Good  Language: Good  Akathisia:  No  Handed:  Right  AIMS (if indicated): not done  Assets:  Communication Skills Desire for Improvement Housing Resilience  ADL's:  Intact  Cognition: WNL  Sleep:  Fair   Screenings: AIMS     Admission (Discharged) from 06/12/2017 in [facility]  AIMS Total Score  0    AUDIT     Admission (Discharged) from 06/12/2017 in [facility]  Alcohol Use Disorder Identification Test Final Score (AUDIT)  0    Mini-Mental     Office Visit from 03/26/2015 in [facility]  Total Score (max 30 points )  30    PHQ2-9     Clinical Support from 06/29/2018 in [facility] Visit from 06/09/2016 in [facility] from 01/13/2016 in [facility] Office Visit from 03/26/2015 in [facility]  PHQ-2 Total Score  3  2  6   0  PHQ-9 Total Score  10  8  26   -       Assessment and Plan: Generalized anxiety disorder.   Bipolar disorder, mixed moderate.  Posttraumatic stress disorder.  I reviewed his medication, collateral information, blood work results.  He has anemia.  He still struggle with pain in his knee joint.  I recommended to try gabapentin which helps his anxiety and pain.  He does not feel doxepin working and I will discontinue that.  I also discussed that he is taking low-dose Lamictal.  He used to take 300 but it is unclear why it is reduced to 150 a day.  We will try to adjust the dose slowly and gradually.  Recommended to increase Lamictal 200 mg daily and in the future we will prescribe 300 mg which he used to take it.  Continue Abilify 5 mg daily.  He has enough Xanax as he takes only for severe panic attack.  Encouraged to discuss his pain medication with his pain specialist.  Encouraged to continue therapy with Justin [name] for coping skills.  Recommended to call us back if he has any question or any concern.  I will see him again in 6 weeks. Time spent 30 minutes.  More than 50% of the time spent in psychoeducation, counseling and coordination of care.  Discuss safety plan that anytime having active suicidal thoughts or homicidal thoughts then patient need to call 911 or go to the local emergency room.     Kathlee Nations, MD 09/24/2018, 8:42 AM

## 2018-09-28 ENCOUNTER — Inpatient Hospital Stay: Payer: Self-pay | Admitting: Internal Medicine

## 2018-10-11 ENCOUNTER — Telehealth (HOSPITAL_COMMUNITY): Payer: Self-pay

## 2018-10-11 DIAGNOSIS — F411 Generalized anxiety disorder: Secondary | ICD-10-CM

## 2018-10-11 MED ORDER — ALPRAZOLAM 0.5 MG PO TABS: 0.5000 mg | ORAL_TABLET | Freq: Two times a day (BID) | ORAL | 0 refills | Status: DC | PRN

## 2018-10-11 NOTE — Telephone Encounter (Signed)
I called his Xanax.  He is few days early and should not getting early refills.

## 2018-10-11 NOTE — Telephone Encounter (Signed)
Patient requesting a refill on Xanax 0.5 mgs. Next appointment is 11/23/18

## 2018-10-24 ENCOUNTER — Ambulatory Visit (INDEPENDENT_AMBULATORY_CARE_PROVIDER_SITE_OTHER): Payer: Medicare Other | Admitting: Licensed Clinical Social Worker

## 2018-10-24 ENCOUNTER — Encounter (HOSPITAL_COMMUNITY): Payer: Self-pay | Admitting: Licensed Clinical Social Worker

## 2018-10-24 DIAGNOSIS — F4321 Adjustment disorder with depressed mood: Secondary | ICD-10-CM | POA: Diagnosis not present

## 2018-10-24 DIAGNOSIS — F3162 Bipolar disorder, current episode mixed, moderate: Secondary | ICD-10-CM | POA: Diagnosis not present

## 2018-10-24 NOTE — Progress Notes (Signed)
THERAPIST PROGRESS NOTE  Session Time: 12:30pm-1:30pm  Participation Level: Active  Behavioral Response: Well GroomedAlertDepressed  Type of Therapy: Individual Therapy  Treatment Goals addressed: improve psychiatric symptoms, Controlled Behavior, Moderated Mood, Deliberative Speech (improved social functioning), Improve Unhelpful Thought Patterns, Emotional Regulation Skills (Moderate moods, anger management, stress management), Feel and express a full Range of Emotions, Learn about Diagnosis, Healthy Coping Skills, Recall the Traumatic event without being overwhelmed    Interventions: CBT, Motivational Interviewing, grounding techniques and mindfulness techniques, and psychoeducation  Summary: Justin [name] is a 73 y.o. male who presents with Bipolar 1 Disorder, Mixed, Moderate and Grief  Suicidal/Homicidal: No - without intent/plan  Therapist Response:  Justin [name] met with clinician for an individual session. Justin [name] discussed his psychiatric symptoms, his current life events and his homework. Justin [name] reports that the 3 year anniversary of his wife's death is in about 2 weeks. He reports increased sense of loneliness and concern about becoming more depressed coming up. Clinician explored other triggers and noted that following knee surgery, he continues to have pain, fluid build up, and fatigue. Clinician reflected frustration and encouraged Justin [name] to be kind to himself and to take his time, as there is no time limit to completing chores or going to the store. Clinician validated Justin [name]'s urge to be productive and active. Clinician also noted that he may re-injure himself if he does not slow down and listen to his body.  Justin [name] reported ongoing up and down relationship with son. Clinician processed his expectations and identified ways to be involved in his son's life without stepping on toes or breaking boundaries. Justin [name] noted his close relationship with his youngest granddaughter, which adds a lot  of meaning to his life. Clinician explored ways he can help with her care during the week (ie, getting her from the bus after school).   Plan: Return again in 2 weeks.  Diagnosis:     Axis I: Bipolar 1 Disorder, Mixed, Moderate and Grief  [name], LCSW 10/24/2018

## 2018-11-08 ENCOUNTER — Encounter (HOSPITAL_COMMUNITY): Payer: Self-pay | Admitting: Licensed Clinical Social Worker

## 2018-11-08 ENCOUNTER — Other Ambulatory Visit: Payer: Self-pay

## 2018-11-08 ENCOUNTER — Ambulatory Visit (INDEPENDENT_AMBULATORY_CARE_PROVIDER_SITE_OTHER): Payer: Medicare Other | Admitting: Licensed Clinical Social Worker

## 2018-11-08 DIAGNOSIS — F4321 Adjustment disorder with depressed mood: Secondary | ICD-10-CM

## 2018-11-08 DIAGNOSIS — F3162 Bipolar disorder, current episode mixed, moderate: Secondary | ICD-10-CM | POA: Diagnosis not present

## 2018-11-08 NOTE — Progress Notes (Signed)
   THERAPIST PROGRESS NOTE  Session Time: 12:30pm-1:30pm  Participation Level: Active  Behavioral Response: Well GroomedAlertDepressed  Type of Therapy: Individual Therapy  Treatment Goals addressed: improve psychiatric symptoms, Controlled Behavior, Moderated Mood, Deliberative Speech (improved social functioning), Improve Unhelpful Thought Patterns, Emotional Regulation Skills (Moderate moods, anger management, stress management), Feel and express a full Range of Emotions, Learn about Diagnosis, Healthy Coping Skills, Recall the Traumatic event without being overwhelmed    Interventions: CBT, Motivational Interviewing, grounding techniques and mindfulness techniques, and psychoeducation  Summary: Justin [name] is a 73 y.o. male who presents with Bipolar 1 Disorder, Mixed, Moderate, Grief  Suicidal/Homicidal: No - without intent/plan  Therapist Response:  Justin [name] met with clinician for an individual session. Justin [name] discussed his psychiatric symptoms, his current life events and his homework. [name] reports he has continued to struggle with stress, especially related to family interactions. Clinician utilized MI OARS to reflect and summarize thoughts and feelings. Clinician identified the importance of self compassion, as rick tends to be very hard on himself, but not have good boundaries with others. Clinician discussed ways to overcome challenges and to move forward with relationships. [name] identified some thoughts of moving forward with life, but then many self-defeating thoughts which discouraged him from standing up for himself or getting a dog.  Clinician processed concerns about knee and reminded him that surgery was just 2 months ago and he has not given himself ample time to rest. He reports he will have to remind himself to be still and rest.  [name] reports today is the 3 year anniversary of his wife's death. He reports feeling guilty for not crying and being very upset. Clinician  normalized this as appropriate for the time frame and again encouraged self care and compassion.   Plan: Return again in 1 -2 weeks.  Diagnosis:     Axis I: Bipolar 1 Disorder, Mixed, Moderate, Grief  [name], LCSW 11/08/2018

## 2018-11-13 ENCOUNTER — Other Ambulatory Visit (HOSPITAL_COMMUNITY): Payer: Self-pay

## 2018-11-13 ENCOUNTER — Other Ambulatory Visit: Payer: Self-pay | Admitting: Physician Assistant

## 2018-11-13 DIAGNOSIS — F411 Generalized anxiety disorder: Secondary | ICD-10-CM

## 2018-11-13 MED ORDER — ALPRAZOLAM 0.5 MG PO TABS: 0.5000 mg | ORAL_TABLET | Freq: Two times a day (BID) | ORAL | 0 refills | Status: DC | PRN

## 2018-11-22 ENCOUNTER — Encounter (HOSPITAL_COMMUNITY): Payer: Self-pay | Admitting: Licensed Clinical Social Worker

## 2018-11-22 ENCOUNTER — Other Ambulatory Visit: Payer: Self-pay

## 2018-11-22 ENCOUNTER — Ambulatory Visit (INDEPENDENT_AMBULATORY_CARE_PROVIDER_SITE_OTHER): Payer: Medicare Other | Admitting: Licensed Clinical Social Worker

## 2018-11-22 DIAGNOSIS — F3162 Bipolar disorder, current episode mixed, moderate: Secondary | ICD-10-CM | POA: Diagnosis not present

## 2018-11-22 NOTE — Progress Notes (Signed)
Virtual Visit via Telephone Note  I connected with Justin [name] on 11/22/18 at 12:30 PM EDT by telephone and verified that I am speaking with the correct person using two identifiers.   I discussed the limitations, risks, security and privacy concerns of performing an evaluation and management service by telephone and the availability of in person appointments. I also discussed with the patient that there may be a patient responsible charge related to this service. The patient expressed understanding and agreed to proceed.   Type of Therapy: Individual Therapy  Treatment Goals addressed: improve psychiatric symptoms, Controlled Behavior, Moderated Mood, Deliberative Speech (improved social functioning), Improve Unhelpful Thought Patterns, Emotional Regulation Skills (Moderate moods, anger management, stress management), Feel and express a full Range of Emotions, Learn about Diagnosis, Healthy Coping Skills, Recall the Traumatic event without being overwhelmed    Interventions: CBT, Motivational Interviewing, grounding techniques and mindfulness techniques, and psychoeducation  Summary: Justin [name] is a 73 y.o. male who presents with Bipolar 1 Disorder, Mixed, Moderate and Dissociative Amnesia, Grief, and PTSD  Suicidal/Homicidal: No - without intent/plan  Therapist Response:  Justin [name] met with clinician for an individual session. Justin [name] discussed his psychiatric symptoms, his current life events and his homework. Justin [name] reports increased loneliness and sadness since the quarantine due to having to be away from his son and grandchildren. He reports that he is feeling lonely and isolated. Clinician explored alternative ways to be with them, such as phone calls and facetime. Clinician also normalized these feelings of being isolated and separate from others. Clinician processed feelings and identified the importance of continuing his routine, being kind to himself, getting outdoors, and doing only  80% of his effort, rather than burning out at 100%.    Plan: Return again in 2 weeks.  Diagnosis:     Axis I: Bipolar 1 Disorder, Mixed, Moderate and Dissociative Amnesia, Grief, and PTSD   I discussed the assessment and treatment plan with the patient. The patient was provided an opportunity to ask questions and all were answered. The patient agreed with the plan and demonstrated an understanding of the instructions.   The patient was advised to call back or seek an in-person evaluation if the symptoms worsen or if the condition fails to improve as anticipated.  I provided 50 minutes of non-face-to-face time during this encounter.   [name], LCSW

## 2018-11-23 ENCOUNTER — Other Ambulatory Visit: Payer: Self-pay

## 2018-11-23 ENCOUNTER — Ambulatory Visit (INDEPENDENT_AMBULATORY_CARE_PROVIDER_SITE_OTHER): Payer: Medicare Other | Admitting: Psychiatry

## 2018-11-23 DIAGNOSIS — F411 Generalized anxiety disorder: Secondary | ICD-10-CM

## 2018-11-23 DIAGNOSIS — F4310 Post-traumatic stress disorder, unspecified: Secondary | ICD-10-CM

## 2018-11-23 DIAGNOSIS — F3162 Bipolar disorder, current episode mixed, moderate: Secondary | ICD-10-CM | POA: Diagnosis not present

## 2018-11-23 MED ORDER — ALPRAZOLAM 0.5 MG PO TABS: 0.5000 mg | ORAL_TABLET | Freq: Two times a day (BID) | ORAL | 2 refills | Status: DC | PRN

## 2018-11-23 MED ORDER — LAMOTRIGINE 200 MG PO TABS: 200.0000 mg | ORAL_TABLET | Freq: Every day | ORAL | 2 refills | Status: DC

## 2018-11-23 MED ORDER — ARIPIPRAZOLE 5 MG PO TABS: 5.0000 mg | ORAL_TABLET | Freq: Every day | ORAL | 2 refills | Status: DC

## 2018-11-23 NOTE — Progress Notes (Signed)
Virtual Visit via Telephone Note  I connected with Justin [name] on 11/23/18 at 10:20 AM EDT by telephone and verified that I am speaking with the correct person using two identifiers.   I discussed the limitations, risks, security and privacy concerns of performing an evaluation and management service by telephone and the availability of in person appointments. I also discussed with the patient that there may be a patient responsible charge related to this service. The patient expressed understanding and agreed to proceed.   History of Present Illness: Patient was evaluated through phone session.  He admitted recently more anxiety because of pandemic coronavirus but overall he feels the medicine working.  He denies any irritability, mood swings or any anger.  He is sad because not able to see his grand kids for more than a week.  But he is talking to his son and grandkids on the phone daily.  Patient told his son told him not to come because he has health issues and son does not want to take any risk for him.  Patient endorsed some time nightmares and flashback but overall they are not as intense.  He is complaining of pain and he is out of his pain medication.  He had appointment to see his pain physician next week.  On his last visit we increase Lamictal which helped his mood and irritability.  He has no rash or itching.  He never picked up the gabapentin.  He like Xanax which helps his anxiety.  He is taking twice a day.  He is not drinking or using any illegal substances.  He understand benzodiazepine dependence tolerance and withdrawal.  He like to continue his medication as prescribed.  Past Psychiatric History: Reviewed. H/O suicidal attempt by taking overdose on Ambien, hydroxyzine and alcohol. H/O bipolar disorder with multiple hospitalization. Did IOP. [facility] in [facility]. Tried Zyprexa, Cymbalta, Seroquel, Vistaril, Ambien, Neurontin, Wellbutrin and  Trileptal. Had a reaction with Ativan and pain medication. Saw Dr. Letta Moynahan in past.   Observations/Objective: Limited mental status examination done on the phone.  Patient describes his mood anxious but relevant in conversation.  Denies any auditory or visual hallucination.  He denies any active or passive suicidal thoughts or homicidal thought.  There were no delusions, paranoia present.  He denies any mania or any impulsive behavior.  He do not reported any side effects of the medication including rash or itching.  He is alert and oriented x3.  His attention and concentration appears to be normal.  His fund of knowledge is adequate.  His cognition is intact.  His insight judgment is okay.  Assessment and Plan: Bipolar disorder, mixed.  Generalized anxiety disorder.  Posttraumatic stress disorder.  Patient is better since Lamictal increase.  He had not picked up the gabapentin so I will discontinue that.  I encouraged to keep appointment with pain specialist as his biggest concern is pain in his joint.  He is tolerating Lamictal and Abilify without any side effects.  We discussed and reminded again benzodiazepine dependence tolerance and withdrawal.  Recommended not to take Xanax more than prescribed.  He agreed with the plan.  He is getting therapy from [facility] for CBT.  Recommended to call us back if is any question or any concern.  Follow-up in 3 months.  Follow Up Instructions:    I discussed the assessment and treatment plan with the patient. The patient was provided an opportunity to ask questions and all  were answered. The patient agreed with the plan and demonstrated an understanding of the instructions.   The patient was advised to call back or seek an in-person evaluation if the symptoms worsen or if the condition fails to improve as anticipated.  I provided 20 minutes of non-face-to-face time during this encounter.   Kathlee Nations, MD

## 2018-12-06 ENCOUNTER — Other Ambulatory Visit: Payer: Self-pay

## 2018-12-06 ENCOUNTER — Encounter (HOSPITAL_COMMUNITY): Payer: Self-pay | Admitting: Licensed Clinical Social Worker

## 2018-12-06 ENCOUNTER — Ambulatory Visit (INDEPENDENT_AMBULATORY_CARE_PROVIDER_SITE_OTHER): Payer: Medicare Other | Admitting: Licensed Clinical Social Worker

## 2018-12-06 DIAGNOSIS — F3162 Bipolar disorder, current episode mixed, moderate: Secondary | ICD-10-CM

## 2018-12-06 DIAGNOSIS — F4310 Post-traumatic stress disorder, unspecified: Secondary | ICD-10-CM

## 2018-12-06 NOTE — Progress Notes (Signed)
Virtual Visit via Telephone Note  I connected with Justin [name] on 12/06/18 at 12:30 PM EDT by telephone and verified that I am speaking with the correct person using two identifiers.   I discussed the limitations, risks, security and privacy concerns of performing an evaluation and management service by telephone and the availability of in person appointments. I also discussed with the patient that there may be a patient responsible charge related to this service. The patient expressed understanding and agreed to proceed.   Type of Therapy: Individual Therapy   Treatment Goals addressed: improve psychiatric symptoms, Controlled Behavior, Moderated Mood, Deliberative Speech (improved social functioning), Improve Unhelpful Thought Patterns, Emotional Regulation Skills (Moderate moods, anger management, stress management), Feel and express a full Range of Emotions, Learn about Diagnosis, Healthy Coping Skills, Recall the Traumatic event without being overwhelmed    Interventions: CBT, Motivational Interviewing, grounding techniques and mindfulness techniques, and psychoeducation   Summary: Justin [name] is a 73 y.o. male who presents with Bipolar 1 Disorder, Mixed, Moderate and PTSD   Suicidal/Homicidal: No - without intent/plan   Therapist Response:  Syd met with clinician for an individual session. Traylen discussed his psychiatric symptoms, his current life events and his homework. [name] processed current status of staying home, being away from his family, and trying to find some normalcy in the current world. He identified some sad days, thinking about his older sister, whose birthday was last week. He also noted that his wife and mother's birthdays are coming up in about 2 weeks. Clinician processed the importance of knowing that these days are difficult and allowing himself to feel the sadness on those days. Clinician also identified the ongoing usefulness of reclaiming those hard days for  positive interactions or activities. Justin [name] reported he will plant some flowers and put some new ferns in the carport so birds can make their nests. Clinician processed and praised that choice and reflected the change in his attitude over the past year. Justin [name] discussed interactions with son and noted some disappointment that he was not as kind and generous as he has been. Clinician reflected the disappointment, and also identified that he can only control himself and he should try not to internalize the disappointment in other people's actions.    Plan: Return again in 1 -2 weeks.   Diagnosis: Axis I: Bipolar 1 Disorder, Mixed, Moderate  and PTSD  I discussed the assessment and treatment plan with the patient. The patient was provided an opportunity to ask questions and all were answered. The patient agreed with the plan and demonstrated an understanding of the instructions.   The patient was advised to call back or seek an in-person evaluation if the symptoms worsen or if the condition fails to improve as anticipated.  I provided 50 minutes of non-face-to-face time during this encounter.   [name], LCSW

## 2018-12-20 ENCOUNTER — Ambulatory Visit (INDEPENDENT_AMBULATORY_CARE_PROVIDER_SITE_OTHER): Payer: Medicare Other | Admitting: Licensed Clinical Social Worker

## 2018-12-20 ENCOUNTER — Other Ambulatory Visit: Payer: Self-pay

## 2018-12-20 ENCOUNTER — Encounter (HOSPITAL_COMMUNITY): Payer: Self-pay | Admitting: Licensed Clinical Social Worker

## 2018-12-20 DIAGNOSIS — F3162 Bipolar disorder, current episode mixed, moderate: Secondary | ICD-10-CM | POA: Diagnosis not present

## 2018-12-20 DIAGNOSIS — F4310 Post-traumatic stress disorder, unspecified: Secondary | ICD-10-CM | POA: Diagnosis not present

## 2018-12-20 NOTE — Progress Notes (Signed)
Virtual Visit via Telephone Note  I connected with Justin [name] on 12/20/18 at 11:00 AM EDT by telephone and verified that I am speaking with the correct person using two identifiers.  Location: Patient: home Provider: home   I discussed the limitations, risks, security and privacy concerns of performing an evaluation and management service by telephone and the availability of in person appointments. I also discussed with the patient that there may be a patient responsible charge related to this service. The patient expressed understanding and agreed to proceed.  Type of Therapy: Individual Therapy   Treatment Goals addressed: improve psychiatric symptoms, Controlled Behavior, Moderated Mood, Deliberative Speech (improved social functioning), Improve Unhelpful Thought Patterns, Emotional Regulation Skills (Moderate moods, anger management, stress management), Feel and express a full Range of Emotions, Learn about Diagnosis, Healthy Coping Skills, Recall the Traumatic event without being overwhelmed    Interventions: CBT, Motivational Interviewing, grounding techniques and mindfulness techniques, and psychoeducation   Summary: Justin [name] is a 73 y.o. male who presents with Bipolar 1 Disorder, Mixed, Moderate, PTSD   Suicidal/Homicidal: No - without intent/plan   Therapist Response:  Justin [name] met with clinician for an individual session. He does not have access to equipment needed for video sessions. Justin [name] discussed his psychiatric symptoms, his current life events and his homework. Justin [name] reports he's had some good days and some bad days. He reports feeling very lonely most days and really getting frustrated that he cannot go out and see people. He reports he talks to his son most days, and he has gone over to see his grandchildren a few times. However, he reports feeling really tired of being alone. Clinician utilized supportive therapy and explored ways to reduce loneliness through online  supports, calling friends and family members, or getting a dog. Justin [name] reports he is considering getting a dog, but he is still not quite ready. Clinician utilized [facility] to reflect and illicit some change talk. Justin [name] processed concerns about coping with change. He identified that his life was filled with change when he was a child and this may be the reason he hates change now. Clinician provided psychoeducation about developmental memories and how the memories are saved at the developmental and emotional stage when they occurred. He reports his happiest time was from [age] years old when his family lived in the same house. He reports after that, the family moved every year.   Plan: Return again in 2-3 weeks.   Diagnosis: Axis I: Bipolar 1 Disorder, Mixed, Moderate, PTSD   I discussed the assessment and treatment plan with the patient. The patient was provided an opportunity to ask questions and all were answered. The patient agreed with the plan and demonstrated an understanding of the instructions.   The patient was advised to call back or seek an in-person evaluation if the symptoms worsen or if the condition fails to improve as anticipated.  I provided 45 minutes of non-face-to-face time during this encounter.   [name], LCSW

## 2019-01-08 ENCOUNTER — Encounter (HOSPITAL_COMMUNITY): Payer: Self-pay | Admitting: Licensed Clinical Social Worker

## 2019-01-08 ENCOUNTER — Ambulatory Visit (INDEPENDENT_AMBULATORY_CARE_PROVIDER_SITE_OTHER): Payer: Medicare Other | Admitting: Licensed Clinical Social Worker

## 2019-01-08 ENCOUNTER — Other Ambulatory Visit: Payer: Self-pay

## 2019-01-08 DIAGNOSIS — F4310 Post-traumatic stress disorder, unspecified: Secondary | ICD-10-CM | POA: Diagnosis not present

## 2019-01-08 DIAGNOSIS — F3162 Bipolar disorder, current episode mixed, moderate: Secondary | ICD-10-CM

## 2019-01-08 NOTE — Progress Notes (Signed)
Virtual Visit via Telephone Note  I connected with [name] on 01/08/19 at 12:30 PM EDT by telephone and verified that I am speaking with the correct person using two identifiers.    I discussed the limitations, risks, security and privacy concerns of performing an evaluation and management service by telephone and the availability of in person appointments. I also discussed with the patient that there may be a patient responsible charge related to this service. The patient expressed understanding and agreed to proceed.  Type of Therapy: Individual Therapy   Treatment Goals addressed: improve psychiatric symptoms, Controlled Behavior, Moderated Mood, Deliberative Speech (improved social functioning), Improve Unhelpful Thought Patterns, Emotional Regulation Skills (Moderate moods, anger management, stress management), Feel and express a full Range of Emotions, Learn about Diagnosis, Healthy Coping Skills, Recall the Traumatic event without being overwhelmed    Interventions: CBT, Motivational Interviewing, grounding techniques and mindfulness techniques, and psychoeducation   Summary: Jakwon Gayton is a 72 y.o. male who presents with Bipolar 1 Disorder, Mixed, Moderate and PTSD   Suicidal/Homicidal: No - without intent/plan   Therapist Response:  Mataeo met with clinician for an individual session. Aarnav discussed his psychiatric symptoms, his current life events and his homework. [name] reports he had a couple of bad weekends with difficulty sleeping and some mood disturbance. Clinician explored triggers and the coping skills that were used at that time. [name] reports he often has trouble falling asleep so he will lay in bed and start having racing thoughts. Clinician processed sleep routine and explored ways to distract himself and relax, such as gentle stretching, listening to music, or listening to white noise. Clinician discussed frustration with his son's inlaws. Clinician processed the  recent incident, which is just one time in an ongoing pattern of his son's father-in-law interrupting or butting in on conversations that he and son are having. Clinician explored ways for [name] to provided gentle feedback, such as "excuse me", or "we were talking". Clinician also offered suggestion of talking to his son about this and seeking advice or support. [name] reports some hesitation, as he maintains his worry that his invitation to family gatherings would be rescinded if he complained. Clinician offered support and also noted that this will only continue until he says something.    Plan: Return again in 1 -2 weeks.   Diagnosis: Axis I: Bipolar 1 Disorder, Mixed, Moderate  and PTSD   I discussed the assessment and treatment plan with the patient. The patient was provided an opportunity to ask questions and all were answered. The patient agreed with the plan and demonstrated an understanding of the instructions.   The patient was advised to call back or seek an in-person evaluation if the symptoms worsen or if the condition fails to improve as anticipated.  I provided 45 minutes of non-face-to-face time during this encounter.   [name], LCSW

## 2019-01-22 ENCOUNTER — Other Ambulatory Visit: Payer: Self-pay

## 2019-01-22 ENCOUNTER — Ambulatory Visit (INDEPENDENT_AMBULATORY_CARE_PROVIDER_SITE_OTHER): Payer: Medicare Other | Admitting: Licensed Clinical Social Worker

## 2019-01-22 ENCOUNTER — Encounter (HOSPITAL_COMMUNITY): Payer: Self-pay | Admitting: Licensed Clinical Social Worker

## 2019-01-22 DIAGNOSIS — F3162 Bipolar disorder, current episode mixed, moderate: Secondary | ICD-10-CM

## 2019-01-22 DIAGNOSIS — F4310 Post-traumatic stress disorder, unspecified: Secondary | ICD-10-CM | POA: Diagnosis not present

## 2019-01-22 NOTE — Progress Notes (Signed)
Virtual Visit via Telephone Note  I connected with Justin [name] on 01/22/19 at 11:00 AM EDT by telephone and verified that I am speaking with the correct person using two identifiers.    I discussed the limitations, risks, security and privacy concerns of performing an evaluation and management service by telephone and the availability of in person appointments. I also discussed with the patient that there may be a patient responsible charge related to this service. The patient expressed understanding and agreed to proceed.   Type of Therapy: Individual Therapy   Treatment Goals addressed: improve psychiatric symptoms, Controlled Behavior, Moderated Mood, Deliberative Speech (improved social functioning), Improve Unhelpful Thought Patterns, Emotional Regulation Skills (Moderate moods, anger management, stress management), Feel and express a full Range of Emotions, Learn about Diagnosis, Healthy Coping Skills, Recall the Traumatic event without being overwhelmed   Interventions: CBT, Motivational Interviewing, grounding techniques and mindfulness techniques, and psychoeducation   Summary: Justin [name] is a 73 y.o. male who presents with Bipolar 1 Disorder, Mixed, Moderate and PTSD   Suicidal/Homicidal: No - without intent/plan   Therapist Response:  Justin [name] met with clinician for an individual session. Justin [name] discussed his psychiatric symptoms, his current life events and his homework. Justin [name] reports he has been having some more depressed and frustrated days, fueled mostly his poor relationship with his son. Clinician processed reasons why he accepts son's behavior toward him and identified that one day he may decide to speak up for himself. Justin [name] reflected fear that his son would exclude him from family life. Clinician validated these feelings and also challenged thoughts that son resented him for living when his mother died. Clinician explored relationship between his wife and son and noted that  son treated wife as poorly as he treats Justin [name], taking advantage, disrespectful, low patience. Clinician identified the importance of self-respect and encouraged Justin [name] to find supports in other areas so as not to be so dependent on his son.   Plan: Return again in 1 -2 weeks.   Diagnosis: Axis I: Bipolar 1 Disorder, Mixed, Moderate and PTSD   I discussed the assessment and treatment plan with the patient. The patient was provided an opportunity to ask questions and all were answered. The patient agreed with the plan and demonstrated an understanding of the instructions.   The patient was advised to call back or seek an in-person evaluation if the symptoms worsen or if the condition fails to improve as anticipated.  I provided 45 minutes of non-face-to-face time during this encounter.   [name], LCSW

## 2019-02-05 ENCOUNTER — Other Ambulatory Visit: Payer: Self-pay

## 2019-02-05 ENCOUNTER — Encounter (HOSPITAL_COMMUNITY): Payer: Self-pay | Admitting: Licensed Clinical Social Worker

## 2019-02-05 ENCOUNTER — Ambulatory Visit (INDEPENDENT_AMBULATORY_CARE_PROVIDER_SITE_OTHER): Payer: Medicare Other | Admitting: Licensed Clinical Social Worker

## 2019-02-05 DIAGNOSIS — F4321 Adjustment disorder with depressed mood: Secondary | ICD-10-CM

## 2019-02-05 DIAGNOSIS — F4310 Post-traumatic stress disorder, unspecified: Secondary | ICD-10-CM | POA: Diagnosis not present

## 2019-02-05 DIAGNOSIS — F3162 Bipolar disorder, current episode mixed, moderate: Secondary | ICD-10-CM | POA: Diagnosis not present

## 2019-02-05 NOTE — Progress Notes (Signed)
Virtual Visit via Telephone Note  I connected with Justin [name] on 02/05/19 at 11:00 AM EDT by telephone and verified that I am speaking with the correct person using two identifiers.     I discussed the limitations, risks, security and privacy concerns of performing an evaluation and management service by telephone and the availability of in person appointments. I also discussed with the patient that there may be a patient responsible charge related to this service. The patient expressed understanding and agreed to proceed.  Type of Therapy: Individual Therapy   Treatment Goals addressed: improve psychiatric symptoms, Controlled Behavior, Moderated Mood, Deliberative Speech (improved social functioning), Improve Unhelpful Thought Patterns, Emotional Regulation Skills (Moderate moods, anger management, stress management), Feel and express a full Range of Emotions, Learn about Diagnosis, Healthy Coping Skills, Recall the Traumatic event without being overwhelmed    Interventions: CBT, Motivational Interviewing, grounding techniques and mindfulness techniques, and psychoeducation   Summary: Justin [name] is a 73 y.o. male who presents with Bipolar 1 Disorder, Mixed, Moderate and Dissociative Amnesia, Grief, and PTSD   Suicidal/Homicidal: No - without intent/plan   Therapist Response:  Aja met with clinician for an individual session. Shawndell discussed his psychiatric symptoms, his current life events and his homework. Justin [name] reports ongoing problems with dealing with his in-laws. He identified an incident that occurred this weekend at his granddaughter's birthday party. Clinician processed the event with [facility] using CBT. Clinician challenged beliefs that he was unlovable or undeserving of love. Clinician also challenged belief that he was responsible for the feelings of others. Clinician explored feelings of guilt from when his children were small. 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 Clinician provided feedback about reasons why he chose those hours and that job. Clinician also noted that he was present on weekends and for family vacations, where he was 1:1 with the kids and got quality time. Clinician encouraged Justin [name] to remember those times and not live in the negativity.   Plan: Return again in 1 -2 weeks.   Diagnosis: Axis I: Bipolar 1 Disorder, Mixed, Moderate and Dissociative Amnesia, Grief, and PTSD     I discussed the assessment and treatment plan with the patient. The patient was provided an opportunity to ask questions and all were answered. The patient agreed with the plan and demonstrated an understanding of the instructions.   The patient was advised to call back or seek an in-person evaluation if the symptoms worsen or if the condition fails to improve as anticipated.  I provided 45 minutes of non-face-to-face time during this encounter.   [name], LCSW

## 2019-02-18 ENCOUNTER — Other Ambulatory Visit (HOSPITAL_COMMUNITY): Payer: Self-pay

## 2019-02-18 DIAGNOSIS — F411 Generalized anxiety disorder: Secondary | ICD-10-CM

## 2019-02-18 MED ORDER — ALPRAZOLAM 0.5 MG PO TABS: 0.5000 mg | ORAL_TABLET | Freq: Two times a day (BID) | ORAL | 0 refills | Status: DC | PRN

## 2019-02-19 ENCOUNTER — Ambulatory Visit (INDEPENDENT_AMBULATORY_CARE_PROVIDER_SITE_OTHER): Payer: Medicare Other | Admitting: Licensed Clinical Social Worker

## 2019-02-19 ENCOUNTER — Encounter (HOSPITAL_COMMUNITY): Payer: Self-pay | Admitting: Licensed Clinical Social Worker

## 2019-02-19 ENCOUNTER — Other Ambulatory Visit: Payer: Self-pay

## 2019-02-19 DIAGNOSIS — F4321 Adjustment disorder with depressed mood: Secondary | ICD-10-CM | POA: Diagnosis not present

## 2019-02-19 DIAGNOSIS — F3162 Bipolar disorder, current episode mixed, moderate: Secondary | ICD-10-CM | POA: Diagnosis not present

## 2019-02-19 NOTE — Progress Notes (Signed)
Virtual Visit via Telephone Note  I connected with Justin [name] on 02/19/19 at  1:30 PM EDT by telephone and verified that I am speaking with the correct person using two identifiers.    I discussed the limitations, risks, security and privacy concerns of performing an evaluation and management service by telephone and the availability of in person appointments. I also discussed with the patient that there may be a patient responsible charge related to this service. The patient expressed understanding and agreed to proceed.   Type of Therapy: Individual Therapy  Treatment Goals addressed: improve psychiatric symptoms, Controlled Behavior, Moderated Mood, Deliberative Speech (improved social functioning), Improve Unhelpful Thought Patterns, Emotional Regulation Skills (Moderate moods, anger management, stress management), Feel and express a full Range of Emotions, Learn about Diagnosis, Healthy Coping Skills, Recall the Traumatic event without being overwhelmed  Interventions: CBT, Motivational Interviewing, grounding techniques and mindfulness techniques, and psychoeducation  Summary: Justin [name] is a 73 y.o. male who presents with Bipolar 1 Disorder, Mixed, Moderate and Grief  Suicidal/Homicidal: No - without intent/plan  Therapist Response:  Gino met with clinician for an individual session. [name] discussed his psychiatric symptoms, his current life events and his homework.He reports that he has been really working on focusing on the positive and not so much on the past and the negative things that have happened to him. Clinician explored this process and noted that this gives him more energy and more ability to complete the tasks he wants to complete. Clinician utilized CBT to challenge thoughts and practice thought stopping techniques. Clinician discussed ways to redirect his thoughts and to change his expectations of himself and others. [name] reports he has been getting along better  with son. He also reports that his daughter texted him a Happy Father's Day message that made him very happy. Clinician discussed the changes in his relationships with his children and noted the importance of valuing how far they have come over the past 2 years.   Plan: Return again in 1 -2 weeks.  Diagnosis: Axis I: Bipolar 1 Disorder, Mixed, Moderate and Grief   I discussed the assessment and treatment plan with the patient. The patient was provided an opportunity to ask questions and all were answered. The patient agreed with the plan and demonstrated an understanding of the instructions.   The patient was advised to call back or seek an in-person evaluation if the symptoms worsen or if the condition fails to improve as anticipated.  I provided 45 minutes of non-face-to-face time during this encounter.   [name], LCSW

## 2019-02-26 ENCOUNTER — Encounter (HOSPITAL_COMMUNITY): Payer: Self-pay | Admitting: Psychiatry

## 2019-02-26 ENCOUNTER — Ambulatory Visit (INDEPENDENT_AMBULATORY_CARE_PROVIDER_SITE_OTHER): Payer: Medicare Other | Admitting: Psychiatry

## 2019-02-26 ENCOUNTER — Other Ambulatory Visit: Payer: Self-pay

## 2019-02-26 DIAGNOSIS — F411 Generalized anxiety disorder: Secondary | ICD-10-CM

## 2019-02-26 DIAGNOSIS — F4310 Post-traumatic stress disorder, unspecified: Secondary | ICD-10-CM | POA: Diagnosis not present

## 2019-02-26 DIAGNOSIS — F3162 Bipolar disorder, current episode mixed, moderate: Secondary | ICD-10-CM

## 2019-02-26 MED ORDER — ARIPIPRAZOLE 5 MG PO TABS: 5.0000 mg | ORAL_TABLET | Freq: Every day | ORAL | 2 refills | Status: DC

## 2019-02-26 MED ORDER — ALPRAZOLAM 0.5 MG PO TABS: 0.5000 mg | ORAL_TABLET | Freq: Two times a day (BID) | ORAL | 2 refills | Status: DC | PRN

## 2019-02-26 MED ORDER — LAMOTRIGINE 200 MG PO TABS: 200.0000 mg | ORAL_TABLET | Freq: Every day | ORAL | 2 refills | Status: DC

## 2019-02-26 MED ORDER — DOXEPIN HCL 50 MG PO CAPS: 50.0000 mg | ORAL_CAPSULE | Freq: Every day | ORAL | 2 refills | Status: DC

## 2019-02-26 NOTE — Progress Notes (Signed)
Virtual Visit via Telephone Note  I connected with Justin [name] on 02/26/19 at  9:40 AM EDT by telephone and verified that I am speaking with the correct person using two identifiers.   I discussed the limitations, risks, security and privacy concerns of performing an evaluation and management service by telephone and the availability of in person appointments. I also discussed with the patient that there may be a patient responsible charge related to this service. The patient expressed understanding and agreed to proceed.   History of Present Illness: Patient was evaluated by phone session.  He is doing well except he has struggled with insomnia.  He try to use leftover trazodone and smaller dose of doxepin.  He did not feel any improvement with the trazodone but able to get few hours of sleep with doxepin.  He used to take 25 mg doxepin which was discontinued when he mentioned that it is not working.  Overall he feels his mood is good.  He denies any crying spells, feeling of hopelessness or worthlessness.  He is seeing Denyse Amass on a regular basis but on his last visit they have decided to schedule next appointment we will further since he is making improvement.  He had a good relationship with his son and his grandkids.  He denies any nightmares or flashbacks for some time he had racing thoughts because of poor sleep.  He has no rash, itching or tremors.  Since he is taking the Lamictal and Abilify he feels these medicines are working very well for him.  He is taking Xanax 0.5 mg twice a day which is helping his anxiety.  He denies any drinking or using any illegal substances.  His energy level is good.  Past Psychiatric History:Reviewed. H/O suicidal attemptby taking overdose on Ambien, hydroxyzine and alcohol. H/Obipolar disorder with multiple inpatient. Did IOP. [facility]. Tried Zyprexa, Cymbalta, Seroquel, Vistaril, Ambien, Neurontin,  WellbutrinandTrileptal.  Tried low-dose doxepin.  We tried trazodone that did not work and Gabapentin but he never picked up the medicine.  Had a reaction with Ativan and pain medication. SawDr. Letta Moynahan inpast.   Psychiatric Specialty Exam: Physical Exam  ROS  There were no vitals taken for this visit.There is no height or weight on file to calculate BMI.  General Appearance: NA  Eye Contact:  NA  Speech:  Clear and Coherent and Slow  Volume:  Normal  Mood:  Euthymic  Affect:  NA  Thought Process:  Goal Directed  Orientation:  Full (Time, Place, and Person)  Thought Content:  WDL and Logical  Suicidal Thoughts:  No  Homicidal Thoughts:  No  Memory:  Immediate;   Good Recent;   Good Remote;   Good  Judgement:  Good  Insight:  Good  Psychomotor Activity:  NA  Concentration:  Concentration: Good and Attention Span: Good  Recall:  Good  Fund of Knowledge:  Good  Language:  Good  Akathisia:  No  Handed:  Right  AIMS (if indicated):     Assets:  Communication Skills Desire for Improvement Housing Resilience Social Support  ADL's:  Intact  Cognition:  WNL  Sleep:   fair      Assessment and Plan: Bipolar disorder type I.  Posttraumatic stress disorder.  Generalized anxiety disorder.  I reviewed his medication.  Recommend not to take the medicine which were discontinued as patient started taking Sinequan and trazodone.  He felt some improvement with Sinequan 25 mg unable to  sleep 2 to 3 hours.  He like to restart Sinequan with a higher dose.  Discussed medication side effects and benefits specially polypharmacy.  However patient at this time feel much better in his mood and like to continue his current medication.  He has no rash or any itching.  I will continue Lamictal 200 mg daily, Abilify 5 mg daily, Xanax 0.5 mg twice a day and we will try Sinequan 50 mg at bedtime.  Discussed benzodiazepine dependence tolerance and withdrawal.  He is no longer taking any  narcotic pain medication.  Patient will continue therapy with Janett Billow.  Recommended to call us back if is any question or any concern.  Follow-up in 3 months.  Follow Up Instructions:    I discussed the assessment and treatment plan with the patient. The patient was provided an opportunity to ask questions and all were answered. The patient agreed with the plan and demonstrated an understanding of the instructions.   The patient was advised to call back or seek an in-person evaluation if the symptoms worsen or if the condition fails to improve as anticipated.  I provided 20 minutes of non-face-to-face time during this encounter.   Kathlee Nations, MD

## 2019-03-05 ENCOUNTER — Other Ambulatory Visit: Payer: Self-pay

## 2019-03-05 ENCOUNTER — Ambulatory Visit (INDEPENDENT_AMBULATORY_CARE_PROVIDER_SITE_OTHER): Payer: Medicare Other | Admitting: Licensed Clinical Social Worker

## 2019-03-05 ENCOUNTER — Encounter (HOSPITAL_COMMUNITY): Payer: Self-pay | Admitting: Licensed Clinical Social Worker

## 2019-03-05 DIAGNOSIS — F4310 Post-traumatic stress disorder, unspecified: Secondary | ICD-10-CM

## 2019-03-05 DIAGNOSIS — F3162 Bipolar disorder, current episode mixed, moderate: Secondary | ICD-10-CM | POA: Diagnosis not present

## 2019-03-05 NOTE — Progress Notes (Signed)
Virtual Visit via Telephone Note  I connected with Justin [name] on 03/05/19 at 11:00 AM EDT by telephone and verified that I am speaking with the correct person using two identifiers.     I discussed the limitations, risks, security and privacy concerns of performing an evaluation and management service by telephone and the availability of in person appointments. I also discussed with the patient that there may be a patient responsible charge related to this service. The patient expressed understanding and agreed to proceed.   Type of Therapy: Individual Therapy  Treatment Goals addressed: improve psychiatric symptoms, Controlled Behavior, Moderated Mood, Deliberative Speech (improved social functioning), Improve Unhelpful Thought Patterns, Emotional Regulation Skills (Moderate moods, anger management, stress management), Feel and express a full Range of Emotions, Learn about Diagnosis, Healthy Coping Skills, Recall the Traumatic event without being overwhelmed  Interventions: CBT, Motivational Interviewing, grounding techniques and mindfulness techniques, and psychoeducation  Summary: Justin [name] is a 73 y.o. male who presents with Bipolar 1 Disorder, Mixed, Moderate and Dissociative Amnesia, Grief, and PTSD  Suicidal/Homicidal: No - without intent/plan  Therapist Response:  Abdullah met with clinician for an individual session. Justin [name] discussed his psychiatric symptoms, his current life events and his homework.  Justin [name] reports he continues to build his relationship with his family. 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 Clinician provided psychoeducation about why this dissociation may have happened and provided feedback about his level of control during this time. Justin [name] reports that he still feels lonely and really misses his wife daily, but especially on days when he plays with his grandchildren. Clinician processed those  feelings and noted the importance of keeping her in mind, but also being present with his granddaughters and enjoying his time with them. Justin [name] reports that the children have really become a driving force in his life and he wants to live and watch them grow up. Clinician provided feedback about recent dreams with wife. Clinician normalized this experience and encouraged Justin [name] to feel easy when thinking about her, noting that he misses her and has been thinking about her a lot.   Plan: Return again in 1 -2 weeks.  Diagnosis: Axis I: Bipolar 1 Disorder, Mixed, Moderate and Dissociative Amnesia, Grief, and PTSD   I discussed the assessment and treatment plan with the patient. The patient was provided an opportunity to ask questions and all were answered. The patient agreed with the plan and demonstrated an understanding of the instructions.   The patient was advised to call back or seek an in-person evaluation if the symptoms worsen or if the condition fails to improve as anticipated.  I provided 45 minutes of non-face-to-face time during this encounter.   [name], LCSW

## 2019-03-20 ENCOUNTER — Other Ambulatory Visit: Payer: Self-pay

## 2019-03-20 ENCOUNTER — Ambulatory Visit (INDEPENDENT_AMBULATORY_CARE_PROVIDER_SITE_OTHER): Payer: Medicare Other | Admitting: Licensed Clinical Social Worker

## 2019-03-20 ENCOUNTER — Encounter (HOSPITAL_COMMUNITY): Payer: Self-pay | Admitting: Licensed Clinical Social Worker

## 2019-03-20 DIAGNOSIS — F4310 Post-traumatic stress disorder, unspecified: Secondary | ICD-10-CM | POA: Diagnosis not present

## 2019-03-20 DIAGNOSIS — F3162 Bipolar disorder, current episode mixed, moderate: Secondary | ICD-10-CM | POA: Diagnosis not present

## 2019-03-20 NOTE — Progress Notes (Signed)
Virtual Visit via Telephone Note  I connected with Justin [name] on 03/20/19 at 11:00 AM EDT by telephone and verified that I am speaking with the correct person using two identifiers.    I discussed the limitations, risks, security and privacy concerns of performing an evaluation and management service by telephone and the availability of in person appointments. I also discussed with the patient that there may be a patient responsible charge related to this service. The patient expressed understanding and agreed to proceed.   Type of Therapy: Individual Therapy  Treatment Goals addressed: improve psychiatric symptoms, Controlled Behavior, Moderated Mood, Deliberative Speech (improved social functioning), Improve Unhelpful Thought Patterns, Emotional Regulation Skills (Moderate moods, anger management, stress management), Feel and express a full Range of Emotions, Learn about Diagnosis, Healthy Coping Skills, Recall the Traumatic event without being overwhelmed  Interventions: CBT, Motivational Interviewing, grounding techniques and mindfulness techniques, and psychoeducation  Summary: Justin [name] is a 73 y.o. male who presents with Bipolar 1 Disorder, Mixed, Moderate and Dissociative Amnesia, Grief, and PTSD  Suicidal/Homicidal: No - without intent/plan  Therapist Response:  Tyller met with clinician for an individual session. Diamante discussed his psychiatric symptoms, his current life events and his homework. Brysin reported that he has been having a rough time with his sleep the past few weeks. He reported that he has gotten back on track now, but for several days he was unable to sleep and unable to relax. Clinician explored sxs of mania and noted trigger that he had not spent much time with family for several days and then he started to have ruminating thoughts. Clinician noted return to thoughts about the past, with his relationship errors with children and sister. He also returned to  thoughts of his wife and blamed himself for losing his memory when she died. Clinician utilized CBT to challenge thoughts and explored ways to correct and change thoughts, particularly related to losing his memory. Clinician identified this dissociation as involuntary, much like a sneeze or an itch, which he had no control over. Clinician also reminded him that obsessing over that now would do more harm than good. Clinciain challenged [name] to think about how life has changed and how he has improved cumulatively over the past 2 years and to feel confident that he has made progress.   Plan: Return again in 1 -2 weeks.  Diagnosis: Axis I: Bipolar 1 Disorder, Mixed, Moderate and Dissociative Amnesia, Grief, and PTSD    I discussed the assessment and treatment plan with the patient. The patient was provided an opportunity to ask questions and all were answered. The patient agreed with the plan and demonstrated an understanding of the instructions.   The patient was advised to call back or seek an in-person evaluation if the symptoms worsen or if the condition fails to improve as anticipated.  I provided 50 minutes of non-face-to-face time during this encounter.   [name], LCSW

## 2019-04-03 ENCOUNTER — Ambulatory Visit (INDEPENDENT_AMBULATORY_CARE_PROVIDER_SITE_OTHER): Payer: Medicare Other | Admitting: Licensed Clinical Social Worker

## 2019-04-03 ENCOUNTER — Encounter (HOSPITAL_COMMUNITY): Payer: Self-pay | Admitting: Licensed Clinical Social Worker

## 2019-04-03 ENCOUNTER — Other Ambulatory Visit: Payer: Self-pay

## 2019-04-03 DIAGNOSIS — F4310 Post-traumatic stress disorder, unspecified: Secondary | ICD-10-CM | POA: Diagnosis not present

## 2019-04-03 DIAGNOSIS — F3162 Bipolar disorder, current episode mixed, moderate: Secondary | ICD-10-CM

## 2019-04-03 DIAGNOSIS — F4321 Adjustment disorder with depressed mood: Secondary | ICD-10-CM | POA: Diagnosis not present

## 2019-04-03 NOTE — Progress Notes (Signed)
Virtual Visit via Telephone Note  I connected with Justin [name] on 04/03/19 at 11:00 AM EDT by telephone and verified that I am speaking with the correct person using two identifiers.     I discussed the limitations, risks, security and privacy concerns of performing an evaluation and management service by telephone and the availability of in person appointments. I also discussed with the patient that there may be a patient responsible charge related to this service. The patient expressed understanding and agreed to proceed.   Type of Therapy: Individual Therapy  Treatment Goals addressed: improve psychiatric symptoms, Controlled Behavior, Moderated Mood, Deliberative Speech (improved social functioning), Improve Unhelpful Thought Patterns, Emotional Regulation Skills (Moderate moods, anger management, stress management), Feel and express a full Range of Emotions, Learn about Diagnosis, Healthy Coping Skills, Recall the Traumatic event without being overwhelmed  Interventions: CBT, Motivational Interviewing, grounding techniques and mindfulness techniques, and psychoeducation  Summary: Justin [name] is a 73 y.o. male who presents with Bipolar 1 Disorder, Mixed, Moderate and Dissociative Amnesia, Grief, and PTSD  Suicidal/Homicidal: No - without intent/plan  Therapist Response:  Jerran met with clinician for an individual session. Amoni discussed his psychiatric symptoms, his current life events and his homework. 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 Clinician utilized [place] OARS to reflect and summarize thoughts and feelings of completing this work for his son and noted concern about how his body is reacting following these long hours of work in the heat. Clinician reminded Justin [name] of his age and reduced ability to do all the hard work he used to be able to do when he was younger.  Clinician also identified the importance of reserving some energy, as he wears out and it takes more than one night to rejuvenate. Justin [name] identified that he was having some difficulty using his right hand after working in the yard for several hours in a row without stopping and without drinking water. Clinician discussed the importance of basic needs, especially when working outside in the heat.  Justin [name] reported feeling guilty that his son injured his foot and was unable to do the work himself. Clinician agreed that the situation was not ideal, but also identified that it is not his full responsibility to do the work for his son, especially if it is going to put him at risk.  Justin [name] also identified some sadness and depressed feelings, plus active dreaming about his wife due to their wedding anniversary being last week.   Plan: Return again in 1 -2 weeks.  Diagnosis: Axis I: Bipolar 1 Disorder, Mixed, Moderate and Dissociative Amnesia, Grief, and PTSD   I discussed the assessment and treatment plan with the patient. The patient was provided an opportunity to ask questions and all were answered. The patient agreed with the plan and demonstrated an understanding of the instructions.   The patient was advised to call back or seek an in-person evaluation if the symptoms worsen or if the condition fails to improve as anticipated.  I provided 55 minutes of non-face-to-face time during this encounter.   [name], LCSW

## 2019-04-17 ENCOUNTER — Encounter (HOSPITAL_COMMUNITY): Payer: Self-pay | Admitting: Licensed Clinical Social Worker

## 2019-04-17 ENCOUNTER — Ambulatory Visit (INDEPENDENT_AMBULATORY_CARE_PROVIDER_SITE_OTHER): Payer: Medicare Other | Admitting: Licensed Clinical Social Worker

## 2019-04-17 ENCOUNTER — Other Ambulatory Visit: Payer: Self-pay

## 2019-04-17 DIAGNOSIS — F3162 Bipolar disorder, current episode mixed, moderate: Secondary | ICD-10-CM | POA: Diagnosis not present

## 2019-04-17 DIAGNOSIS — F4310 Post-traumatic stress disorder, unspecified: Secondary | ICD-10-CM

## 2019-04-17 NOTE — Progress Notes (Signed)
Virtual Visit via Telephone Note  I connected with Justin [name] on 04/17/19 at 11:00 AM EDT by telephone and verified that I am speaking with the correct person using two identifiers.     I discussed the limitations, risks, security and privacy concerns of performing an evaluation and management service by telephone and the availability of in person appointments. I also discussed with the patient that there may be a patient responsible charge related to this service. The patient expressed understanding and agreed to proceed.   Type of Therapy: Individual Therapy  Treatment Goals addressed: improve psychiatric symptoms, Controlled Behavior, Moderated Mood, Deliberative Speech (improved social functioning), Improve Unhelpful Thought Patterns, Emotional Regulation Skills (Moderate moods, anger management, stress management), Feel and express a full Range of Emotions, Learn about Diagnosis, Healthy Coping Skills, Recall the Traumatic event without being overwhelmed  Interventions: CBT, Motivational Interviewing, grounding techniques and mindfulness techniques, and psychoeducation  Summary: Justin [name] is a 73 y.o. male who presents with Bipolar 1 Disorder, Mixed, Moderate and Dissociative Amnesia, Grief, and PTSD  Suicidal/Homicidal: No - without intent/plan  Therapist Response:  Justin [name] met with clinician for an individual session. Justin [name] discussed his psychiatric symptoms, his current life events and his homework. He reports he has been having some old memories pop up in his mind about his childhood, which have been distressing to him. Clinician processed the memories and noted that his "go to" response is guilt. Clinician processed the guilt, which has been an overarching theme in his therapy, and explored reasons why this has become so prominent. Clinician identified and reflected wishing he could have done more or made different choices to change the past. Clinician provided reality testing  and noted that even if he thought there would be a different path, the results may not have changed. Clinician encouraged Justin [name] to think about his memories from the perspective of others. For example, thinking about his mother's reasoning for giving away the dog after it had attacked a child. Clinician reminded Justin [name] that his memories are skewed because he remembers them as a child, not as an adult. Clinician also encouraged Justin [name] to focus his attention on more positive memories, especially when his thoughts turn to sad ones.   Plan: Return again in 1 -2 weeks.  Diagnosis: Axis I: Bipolar 1 Disorder, Mixed, Moderate and Dissociative Amnesia, Grief, and PTSD    I discussed the assessment and treatment plan with the patient. The patient was provided an opportunity to ask questions and all were answered. The patient agreed with the plan and demonstrated an understanding of the instructions.   The patient was advised to call back or seek an in-person evaluation if the symptoms worsen or if the condition fails to improve as anticipated.  I provided 50 minutes of non-face-to-face time during this encounter.   [name], LCSW

## 2019-05-01 ENCOUNTER — Ambulatory Visit (INDEPENDENT_AMBULATORY_CARE_PROVIDER_SITE_OTHER): Payer: Medicare Other | Admitting: Licensed Clinical Social Worker

## 2019-05-01 ENCOUNTER — Other Ambulatory Visit: Payer: Self-pay

## 2019-05-01 ENCOUNTER — Encounter (HOSPITAL_COMMUNITY): Payer: Self-pay | Admitting: Licensed Clinical Social Worker

## 2019-05-01 DIAGNOSIS — F3162 Bipolar disorder, current episode mixed, moderate: Secondary | ICD-10-CM

## 2019-05-01 NOTE — Progress Notes (Signed)
Virtual Visit via Telephone Note  I connected with Justin [name] on 05/01/19 at 11:00 AM EDT by telephone and verified that I am speaking with the correct person using two identifiers.     I discussed the limitations, risks, security and privacy concerns of performing an evaluation and management service by telephone and the availability of in person appointments. I also discussed with the patient that there may be a patient responsible charge related to this service. The patient expressed understanding and agreed to proceed.   Type of Therapy: Individual Therapy  Treatment Goals addressed: improve psychiatric symptoms, Controlled Behavior, Moderated Mood, Deliberative Speech (improved social functioning), Improve Unhelpful Thought Patterns, Emotional Regulation Skills (Moderate moods, anger management, stress management), Feel and express a full Range of Emotions, Learn about Diagnosis, Healthy Coping Skills, Recall the Traumatic event without being overwhelmed  Interventions: CBT, Motivational Interviewing, grounding techniques and mindfulness techniques, and psychoeducation  Summary: Justin [name] is a 73 y.o. male who presents with Bipolar 1 Disorder, Mixed, Moderate  Suicidal/Homicidal: No - without intent/plan  Therapist Response:  Justin [name] met with clinician for an individual session. Justin [name] discussed his psychiatric symptoms, his current life events and his homework. Justin [name] reports he has been feeling a little better since last session. He identified increased socialization with his family and their friends. He processed experience of going swimming with his grandchildren, as well as going to his granddaughter's birthday celebration. Clinician explored interactions with others and noted that he has been working on his social skills and trying to communicate more effectively. Justin [name] reports things have been better and he was invited to spend Sunday dinners with his son and family every week.  Clinician reflected the pride and contentment present in Justin [name], as he is cautiously optimistic that this will actually happen.  Justin [name] reports lack of appetite lately and noted some weight loss over the past month. Justin [name] has a physical scheduled in a couple of weeks. Clinician encouraged him to talk to his Dr about weight, appropriate calorie intake, and reminded him to eat and drink plenty of fluids, especially when he is out working in the yard. Justin [name] reports he tires out faster than he did when he was younger. Clinician reminded Justin [name] that he is not 33 anymore and he needs to listen to his body and remain mindful of this energy reserves.   Plan: Return again in 1 -2 weeks.  Diagnosis: Axis I: Bipolar 1 Disorder, Mixed, Moderate    I discussed the assessment and treatment plan with the patient. The patient was provided an opportunity to ask questions and all were answered. The patient agreed with the plan and demonstrated an understanding of the instructions.   The patient was advised to call back or seek an in-person evaluation if the symptoms worsen or if the condition fails to improve as anticipated.  I provided 45 minutes of non-face-to-face time during this encounter.   [name], LCSW

## 2019-05-14 ENCOUNTER — Encounter (HOSPITAL_COMMUNITY): Payer: Self-pay | Admitting: Licensed Clinical Social Worker

## 2019-05-14 ENCOUNTER — Other Ambulatory Visit: Payer: Self-pay

## 2019-05-14 ENCOUNTER — Ambulatory Visit (INDEPENDENT_AMBULATORY_CARE_PROVIDER_SITE_OTHER): Payer: Medicare Other | Admitting: Licensed Clinical Social Worker

## 2019-05-14 DIAGNOSIS — F3162 Bipolar disorder, current episode mixed, moderate: Secondary | ICD-10-CM | POA: Diagnosis not present

## 2019-05-14 NOTE — Progress Notes (Signed)
Virtual Visit via Telephone Note  I connected with Justin [name] on 05/14/19 at 12:30 PM EDT by telephone and verified that I am speaking with the correct person using two identifiers.     I discussed the limitations, risks, security and privacy concerns of performing an evaluation and management service by telephone and the availability of in person appointments. I also discussed with the patient that there may be a patient responsible charge related to this service. The patient expressed understanding and agreed to proceed.   Type of Therapy: Individual Therapy  Treatment Goals addressed: improve psychiatric symptoms, Controlled Behavior, Moderated Mood, Deliberative Speech (improved social functioning), Improve Unhelpful Thought Patterns, Emotional Regulation Skills (Moderate moods, anger management, stress management), Feel and express a full Range of Emotions, Learn about Diagnosis, Healthy Coping Skills, Recall the Traumatic event without being overwhelmed  Interventions: CBT, Motivational Interviewing, grounding techniques and mindfulness techniques, and psychoeducation  Summary: Justin [name] is a 73 y.o. male who presents with Bipolar 1 Disorder, Mixed, Moderate  Suicidal/Homicidal: No - without intent/plan  Therapist Response:  Justin [name] met with clinician for an individual session. Jaxxen discussed his psychiatric symptoms, his current life events and his homework. He reports he has been doing a little better. He reported concern about sleep, noting that he had one sleepless night where he went to bed and only slept from about midnight-3am and then was up the rest of the day. He reports he took a doxepin the next evening around 6 and proceeded to sleep about 13 hours. He reported when he awoke the next day he still felt "swimmy headed". Clinician encouraged Justin [name] to speak with Dr. Adele Schilder about the medication and possibly reducing the dose.  Justin [name] reports he has been doing better with  his son and has been spending more time with his grandchildren. Clinician processed the change in their relationship, noting ongoing guilt for not being able to do everything for his son while he recovers from his broken foot. Clinician reminded Justin [name] about self-care and also identified that he needs to parcel out his energy, so he can have some left for him.   Plan: Return again in 1 -2 weeks.  Diagnosis: Axis I: Bipolar 1 Disorder, Mixed, Moderate    I discussed the assessment and treatment plan with the patient. The patient was provided an opportunity to ask questions and all were answered. The patient agreed with the plan and demonstrated an understanding of the instructions.   The patient was advised to call back or seek an in-person evaluation if the symptoms worsen or if the condition fails to improve as anticipated.  I provided 45 minutes of non-face-to-face time during this encounter.   [name], LCSW

## 2019-05-15 ENCOUNTER — Other Ambulatory Visit: Payer: Self-pay

## 2019-05-15 ENCOUNTER — Telehealth: Payer: Self-pay | Admitting: Internal Medicine

## 2019-05-15 ENCOUNTER — Encounter: Payer: Self-pay | Admitting: Internal Medicine

## 2019-05-15 ENCOUNTER — Ambulatory Visit (INDEPENDENT_AMBULATORY_CARE_PROVIDER_SITE_OTHER): Payer: Medicare Other | Admitting: Internal Medicine

## 2019-05-15 ENCOUNTER — Other Ambulatory Visit (INDEPENDENT_AMBULATORY_CARE_PROVIDER_SITE_OTHER): Payer: Medicare Other

## 2019-05-15 VITALS — BP 122/82 | HR 81 | Temp 98.4°F | Ht 71.0 in | Wt 170.0 lb

## 2019-05-15 DIAGNOSIS — E611 Iron deficiency: Secondary | ICD-10-CM | POA: Diagnosis not present

## 2019-05-15 DIAGNOSIS — Z0000 Encounter for general adult medical examination without abnormal findings: Secondary | ICD-10-CM | POA: Diagnosis not present

## 2019-05-15 DIAGNOSIS — E538 Deficiency of other specified B group vitamins: Secondary | ICD-10-CM

## 2019-05-15 DIAGNOSIS — R739 Hyperglycemia, unspecified: Secondary | ICD-10-CM

## 2019-05-15 DIAGNOSIS — Z23 Encounter for immunization: Secondary | ICD-10-CM | POA: Diagnosis not present

## 2019-05-15 DIAGNOSIS — E559 Vitamin D deficiency, unspecified: Secondary | ICD-10-CM | POA: Diagnosis not present

## 2019-05-15 LAB — BASIC METABOLIC PANEL
BUN: 7 mg/dL (ref 6–23)
CO2: 26 mEq/L (ref 19–32)
Calcium: 9.8 mg/dL (ref 8.4–10.5)
Chloride: 100 mEq/L (ref 96–112)
Creatinine, Ser: 0.92 mg/dL (ref 0.40–1.50)
GFR: 80.65 mL/min (ref >60.00)
Glucose, Bld: 101 mg/dL — ABNORMAL HIGH (ref 70–99)
Potassium: 4.3 mEq/L (ref 3.5–5.1)
Sodium: 135 mEq/L (ref 135–145)

## 2019-05-15 LAB — HEPATIC FUNCTION PANEL
ALT: 15 U/L (ref 0–53)
AST: 23 U/L (ref 0–37)
Albumin: 4.5 g/dL (ref 3.5–5.2)
Alkaline Phosphatase: 61 U/L (ref 39–117)
Bilirubin, Direct: 0.2 mg/dL (ref 0.0–0.3)
Total Bilirubin: 0.9 mg/dL (ref 0.2–1.2)
Total Protein: 6.7 g/dL (ref 6.0–8.3)

## 2019-05-15 LAB — CBC WITH DIFFERENTIAL/PLATELET
Basophils Absolute: 0.1 10*3/uL (ref 0.0–0.1)
Basophils Relative: 1.2 % (ref 0.0–3.0)
Eosinophils Absolute: 0 10*3/uL (ref 0.0–0.7)
Eosinophils Relative: 0.1 % (ref 0.0–5.0)
HCT: 42.4 % (ref 39.0–52.0)
Hemoglobin: 14.3 g/dL (ref 13.0–17.0)
Lymphocytes Relative: 23.5 % (ref 12.0–46.0)
Lymphs Abs: 1.1 10*3/uL (ref 0.7–4.0)
MCHC: 33.6 g/dL (ref 30.0–36.0)
MCV: 95.9 fl (ref 78.0–100.0)
Monocytes Absolute: 0.4 10*3/uL (ref 0.1–1.0)
Monocytes Relative: 9.2 % (ref 3.0–12.0)
Neutro Abs: 3.1 10*3/uL (ref 1.4–7.7)
Neutrophils Relative %: 66 % (ref 43.0–77.0)
Platelets: 241 10*3/uL (ref 150.0–400.0)
RBC: 4.42 Mil/uL (ref 4.22–5.81)
RDW: 12.7 % (ref 11.5–15.5)
WBC: 4.7 10*3/uL (ref 4.0–10.5)

## 2019-05-15 LAB — TSH: TSH: 0.77 u[IU]/mL (ref 0.35–4.50)

## 2019-05-15 LAB — LIPID PANEL
Cholesterol: 118 mg/dL (ref 0–200)
HDL: 50.8 mg/dL (ref >39.00)
LDL Cholesterol: 53 mg/dL (ref 0–99)
NonHDL: 67.26
Total CHOL/HDL Ratio: 2
Triglycerides: 72 mg/dL (ref 0.0–149.0)
VLDL: 14.4 mg/dL (ref 0.0–40.0)

## 2019-05-15 LAB — IBC PANEL
Iron: 123 ug/dL (ref 42–165)
Saturation Ratios: 35 % (ref 20.0–50.0)
Transferrin: 251 mg/dL (ref 212.0–360.0)

## 2019-05-15 LAB — PSA: PSA: 2.42 ng/mL (ref 0.10–4.00)

## 2019-05-15 LAB — VITAMIN B12: Vitamin B-12: 248 pg/mL (ref 211–911)

## 2019-05-15 LAB — HEMOGLOBIN A1C: Hgb A1c MFr Bld: 4.8 % (ref 4.6–6.5)

## 2019-05-15 LAB — VITAMIN D 25 HYDROXY (VIT D DEFICIENCY, FRACTURES): VITD: 40.58 ng/mL (ref 30.00–100.00)

## 2019-05-15 MED ORDER — ZOLPIDEM TARTRATE 5 MG PO TABS: 5.0000 mg | ORAL_TABLET | Freq: Every evening | ORAL | 5 refills | Status: DC | PRN

## 2019-05-15 NOTE — Assessment & Plan Note (Signed)

## 2019-05-15 NOTE — Patient Instructions (Addendum)
You had the flu shot today  Please take all new medication as prescribed - the ambien 5 mg at bedtime as needed  Please continue all other medications as before, and refills have been done if requested.  Please have the pharmacy call with any other refills you may need.  Please continue your efforts at being more active, low cholesterol diet, and weight control.  You are otherwise up to date with prevention measures today.  Please keep your appointments with your specialists as you may have planned  Please go to the LAB in the Basement (turn left off the elevator) for the tests to be done today  You will be contacted by phone if any changes need to be made immediately.  Otherwise, you will receive a letter about your results with an explanation, but please check with MyChart first.  Please remember to sign up for MyChart if you have not done so, as this will be important to you in the future with finding out test results, communicating by private email, and scheduling acute appointments online when needed.  Please return in 6 months, or sooner if needed

## 2019-05-15 NOTE — Telephone Encounter (Signed)
Patient called regarding the Ambien prescription that was called in today. He had a couple questions.  Can you call this patient to discuss?

## 2019-05-15 NOTE — Progress Notes (Signed)
Subjective:    Patient ID: Justin [name], male    DOB: [birth year], 73 y.o.   MRN: [id number]  HPI    Here for wellness and f/u;  Overall doing ok;  Pt denies Chest pain, worsening SOB, DOE, wheezing, orthopnea, PND, worsening LE edema, palpitations, dizziness or syncope.  Pt denies neurological change such as new headache, facial or extremity weakness.  Pt denies polydipsia, polyuria, or low sugar symptoms. Pt states overall good compliance with treatment and medications, good tolerability, and has been trying to follow appropriate diet.  Pt denies worsening depressive symptoms, suicidal ideation or panic. No fever, night sweats, wt loss, loss of appetite, or other constitutional symptoms.  Pt states good ability with ADL's, has low fall risk, home safety reviewed and adequate, no other significant changes in hearing or vision, and only occasionally active with exercise.  S/p ACL tear with left knee TKR jan 2020..  Asks for med to sleep Past Medical History:  Diagnosis Date  . Anxiety and depression   . Arthritis    knees, c-spine // gets lumbar ESI q 6 mos  . Bipolar disorder ([place])   . CAD (coronary artery disease)    [place] 08/2009 >> LHC ([facility]) - mLAD 70, Dx 65 >> PCI:  3x15 mm Endeavor DES to mLAD and 2.5x12 mm Endeavor DES to Dx // S/p NSTEMI >> LHC 8/12 ([facility]) - LM ok, LAD prox and mid 40; LAD stent ok, Dx stent ok, mRCA 30, mLCx 50 >> med Rx // ETT-Echo 2/17 ([facility]): Normal, EF 55-60 at rest  . Chicken pox   . 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 Grief   . History of echocardiogram    Echo 3/19: Mild concentric LVH, EF 60-65, normal wall motion, grade 1 diastolic dysfunction, mild AI, mildly dilated aortic root (40 mm), MAC, mild LAE, atrial septal lipomatous hypertrophy  . History of non-ST elevation myocardial infarction (NSTEMI)   . History of nuclear stress test    Nuclear stress test 3/19: EF 57, inferior/inferoseptal/inferolateral defect consistent with probable soft  tissue attenuation (cannot exclude subendocardial scar), no ischemia, intermediate risk >> Echo 3/19 normal EF, normal wall motion  . History of pneumonia 2011  . History of prostatitis 1990s  . Hx of adenomatous colonic polyps 06/22/2016  . Hyperlipidemia   . Hypertension   . Migraines    prior history  . PTSD (post-traumatic stress disorder)    Past Surgical History:  Procedure Laterality Date  . CERVICAL DISCECTOMY    . COLONOSCOPY  03/2017  . ELBOW SURGERY Left   . ELBOW SURGERY Right   . INGUINAL HERNIA REPAIR Bilateral    1996, 1997  . KNEE ARTHROSCOPY Right   . KNEE CARTILAGE SURGERY Left   . NASAL SEPTUM SURGERY    . STENT PLACEMENT VASCULAR ([place] HX)  09/02/2010  . TONSILLECTOMY    . TOTAL KNEE ARTHROPLASTY Left 08/31/2018   Procedure: TOTAL KNEE ARTHROPLASTY;  Surgeon: Dorna Leitz, MD;  Location: [facility];  Service: Orthopedics;  Laterality: Left;    reports that he has never smoked. He has never used smokeless tobacco. He reports that he does not drink alcohol or use drugs. family history includes Alzheimer's disease in his mother; Arthritis in his father and mother; Depression in his brother; Diabetes in his sister and sister; Heart attack in his father, mother, and sister; Heart disease in his father and mother; Hyperlipidemia in his father; Hypertension in his father and mother; Lung cancer  in his maternal aunt, paternal aunt, and paternal grandmother; Multiple sclerosis in his sister; Pulmonary embolism in his sister; Stroke in his father. Allergies  Allergen Reactions  . Ambien [Zolpidem Tartrate] Other (See Comments)    Blackout, memory issues  . Seroquel [Quetiapine]     SEVERE NIGHTMARES, SLEEPWALK AND NIGHT DRIVE WITH NO RECOLLECTION UPON WAKENING   Current Outpatient Medications on File Prior to Visit  Medication Sig Dispense Refill  . ALPRAZolam (XANAX) 0.5 MG tablet Take 1 tablet (0.5 mg total) by mouth 2 (two) times daily as needed for anxiety. 60 tablet 2   . ARIPiprazole (ABILIFY) 5 MG tablet Take 1 tablet (5 mg total) by mouth daily. 30 tablet 2  . clopidogrel (PLAVIX) 75 MG tablet Take 1 tablet (75 mg total) by mouth daily. 30 tablet 11  . diltiazem (CARTIA XT) 180 MG 24 hr capsule Take 1 capsule (180 mg total) by mouth daily. 30 capsule 0  . docusate sodium (COLACE) 100 MG capsule Take 1 capsule (100 mg total) by mouth 2 (two) times daily. 30 capsule 0  . doxepin (SINEQUAN) 50 MG capsule Take 1 capsule (50 mg total) by mouth at bedtime. 30 capsule 2  . lamoTRIgine (LAMICTAL) 200 MG tablet Take 1 tablet (200 mg total) by mouth daily. For mood control 30 tablet 2  . meloxicam (MOBIC) 15 MG tablet     . nitroGLYCERIN (NITROSTAT) 0.4 MG SL tablet Place 1 tablet (0.4 mg total) under the tongue every 5 (five) minutes as needed for chest pain. 25 tablet 11  . rosuvastatin (CRESTOR) 20 MG tablet Take 1 tablet by mouth once daily 90 tablet 1   No current facility-administered medications on file prior to visit.    Review of Systems Constitutional: Negative for other unusual diaphoresis, sweats, appetite or weight changes HENT: Negative for other worsening hearing loss, ear pain, facial swelling, mouth sores or neck stiffness.   Eyes: Negative for other worsening pain, redness or other visual disturbance.  Respiratory: Negative for other stridor or swelling Cardiovascular: Negative for other palpitations or other chest pain  Gastrointestinal: Negative for worsening diarrhea or loose stools, blood in stool, distention or other pain Genitourinary: Negative for hematuria, flank pain or other change in urine volume.  Musculoskeletal: Negative for myalgias or other joint swelling.  Skin: Negative for other color change, or other wound or worsening drainage.  Neurological: Negative for other syncope or numbness. Hematological: Negative for other adenopathy or swelling Psychiatric/Behavioral: Negative for hallucinations, other worsening agitation, SI,  self-injury, or new decreased concentration All otherwise neg per pt    Objective:   Physical Exam BP 122/82   Pulse 81   Temp 98.4 F (36.9 C) (Oral)   Ht _0  (1.803 m)   Wt 170 lb (77.1 kg)   SpO2 97%   BMI 23.71 kg/m  VS noted,  Constitutional: Pt is oriented to person, place, and time. Appears well-developed and well-nourished, in no significant distress and comfortable Head: Normocephalic and atraumatic  Eyes: Conjunctivae and EOM are normal. Pupils are equal, round, and reactive to light Right Ear: External ear normal without discharge Left Ear: External ear normal without discharge Nose: Nose without discharge or deformity Mouth/Throat: Oropharynx is without other ulcerations and moist  Neck: Normal range of motion. Neck supple. No JVD present. No tracheal deviation present or significant neck LA or mass Cardiovascular: Normal rate, regular rhythm, normal heart sounds and intact distal pulses.   Pulmonary/Chest: WOB normal and breath sounds without rales or  wheezing  Abdominal: Soft. Bowel sounds are normal. NT. No HSM  Musculoskeletal: Normal range of motion. Exhibits no edema Lymphadenopathy: Has no other cervical adenopathy.  Neurological: Pt is alert and oriented to person, place, and time. Pt has normal reflexes. No cranial nerve deficit. Motor grossly intact, Gait intact Skin: Skin is warm and dry. No rash noted or new ulcerations Psychiatric:  Has normal mood and affect. Behavior is normal without agitation No other exam findings Lab Results  Component Value Date   WBC 7.1 09/03/2018   HGB 9.3 (L) 09/03/2018   HCT 29.3 (L) 09/03/2018   PLT 191 09/03/2018   GLUCOSE 96 08/21/2018   CHOL 150 06/14/2017   TRIG 116 06/14/2017   HDL 61 06/14/2017   LDLCALC 66 06/14/2017   ALT 19 08/21/2018   AST 31 08/21/2018   NA 137 08/21/2018   K 3.9 08/21/2018   CL 102 08/21/2018   CREATININE 0.93 08/21/2018   BUN 13 08/21/2018   CO2 25 08/21/2018   TSH 1.189  06/14/2017   PSA 2.96 06/19/2017   INR 0.95 08/21/2018   HGBA1C 4.5 (L) 06/19/2017      Assessment & Plan:

## 2019-05-15 NOTE — Assessment & Plan Note (Signed)
Also for a1c with labs 

## 2019-05-15 NOTE — Telephone Encounter (Signed)
Spoke to the pharmacist. She will be sending information over about a PA

## 2019-05-19 ENCOUNTER — Other Ambulatory Visit: Payer: Self-pay | Admitting: Physician Assistant

## 2019-05-20 ENCOUNTER — Emergency Department (HOSPITAL_COMMUNITY)
Admission: EM | Admit: 2019-05-20 | Discharge: 2019-05-21 | Disposition: A | Discharge status: Home / Self Care | Payer: Medicare Other | Attending: Emergency Medicine | Admitting: Emergency Medicine

## 2019-05-20 ENCOUNTER — Emergency Department (HOSPITAL_COMMUNITY): Payer: Medicare Other

## 2019-05-20 ENCOUNTER — Other Ambulatory Visit: Payer: Self-pay

## 2019-05-20 DIAGNOSIS — I10 Essential (primary) hypertension: Secondary | ICD-10-CM | POA: Diagnosis not present

## 2019-05-20 DIAGNOSIS — R4182 Altered mental status, unspecified: Secondary | ICD-10-CM | POA: Diagnosis not present

## 2019-05-20 DIAGNOSIS — Z20828 Contact with and (suspected) exposure to other viral communicable diseases: Secondary | ICD-10-CM | POA: Insufficient documentation

## 2019-05-20 DIAGNOSIS — I2510 Atherosclerotic heart disease of native coronary artery without angina pectoris: Secondary | ICD-10-CM | POA: Diagnosis not present

## 2019-05-20 DIAGNOSIS — F319 Bipolar disorder, unspecified: Secondary | ICD-10-CM | POA: Diagnosis not present

## 2019-05-20 DIAGNOSIS — F329 Major depressive disorder, single episode, unspecified: Secondary | ICD-10-CM

## 2019-05-20 DIAGNOSIS — F339 Major depressive disorder, recurrent, unspecified: Secondary | ICD-10-CM | POA: Diagnosis not present

## 2019-05-20 DIAGNOSIS — F32A Depression, unspecified: Secondary | ICD-10-CM

## 2019-05-20 DIAGNOSIS — Z79899 Other long term (current) drug therapy: Secondary | ICD-10-CM | POA: Diagnosis not present

## 2019-05-20 DIAGNOSIS — R413 Other amnesia: Secondary | ICD-10-CM | POA: Diagnosis present

## 2019-05-20 LAB — CBC
HCT: 40.7 % (ref 39.0–52.0)
Hemoglobin: 14.1 g/dL (ref 13.0–17.0)
MCH: 32.6 pg (ref 26.0–34.0)
MCHC: 34.6 g/dL (ref 30.0–36.0)
MCV: 94.2 fL (ref 80.0–100.0)
Platelets: 223 10*3/uL (ref 150–400)
RBC: 4.32 MIL/uL (ref 4.22–5.81)
RDW: 11.7 % (ref 11.5–15.5)
WBC: 4.7 10*3/uL (ref 4.0–10.5)
nRBC: 0 % (ref 0.0–0.2)

## 2019-05-20 LAB — BASIC METABOLIC PANEL
Anion gap: 8 (ref 5–15)
BUN: 5 mg/dL — ABNORMAL LOW (ref 8–23)
CO2: 25 mmol/L (ref 22–32)
Calcium: 9.1 mg/dL (ref 8.9–10.3)
Chloride: 97 mmol/L — ABNORMAL LOW (ref 98–111)
Creatinine, Ser: 0.89 mg/dL (ref 0.61–1.24)
GFR calc Af Amer: >60 mL/min (ref >60)
GFR calc non Af Amer: >60 mL/min (ref >60)
Glucose, Bld: 118 mg/dL — ABNORMAL HIGH (ref 70–99)
Potassium: 3.4 mmol/L — ABNORMAL LOW (ref 3.5–5.1)
Sodium: 130 mmol/L — ABNORMAL LOW (ref 135–145)

## 2019-05-20 LAB — RAPID URINE DRUG SCREEN, HOSP PERFORMED
Amphetamines: NOT DETECTED
Barbiturates: NOT DETECTED
Benzodiazepines: NOT DETECTED
Cocaine: NOT DETECTED
Opiates: NOT DETECTED
Tetrahydrocannabinol: NOT DETECTED

## 2019-05-20 LAB — URINALYSIS, ROUTINE W REFLEX MICROSCOPIC
Bilirubin Urine: NEGATIVE
Glucose, UA: NEGATIVE mg/dL
Hgb urine dipstick: NEGATIVE
Ketones, ur: NEGATIVE mg/dL
Leukocytes,Ua: NEGATIVE
Nitrite: NEGATIVE
Protein, ur: NEGATIVE mg/dL
Specific Gravity, Urine: 1.002 — ABNORMAL LOW (ref 1.005–1.030)
pH: 7 (ref 5.0–8.0)

## 2019-05-20 LAB — ETHANOL: Alcohol, Ethyl (B): <10 mg/dL (ref <10)

## 2019-05-20 MED ORDER — LAMOTRIGINE 100 MG PO TABS
200.0000 mg | ORAL_TABLET | Freq: Every day | ORAL | Status: DC
  Administered 2019-05-21: 200 mg via ORAL
  Filled 2019-05-20: qty 2

## 2019-05-20 MED ORDER — DILTIAZEM HCL ER COATED BEADS 180 MG PO CP24
180.0000 mg | ORAL_CAPSULE | Freq: Every day | ORAL | Status: DC
  Administered 2019-05-21: 180 mg via ORAL
  Filled 2019-05-20: qty 1

## 2019-05-20 MED ORDER — DOCUSATE SODIUM 100 MG PO CAPS
100.0000 mg | ORAL_CAPSULE | Freq: Two times a day (BID) | ORAL | Status: DC
  Administered 2019-05-21 (×2): 100 mg via ORAL
  Filled 2019-05-20 (×2): qty 1

## 2019-05-20 MED ORDER — CLOPIDOGREL BISULFATE 75 MG PO TABS
75.0000 mg | ORAL_TABLET | Freq: Every day | ORAL | Status: DC
  Administered 2019-05-21: 75 mg via ORAL
  Filled 2019-05-20: qty 1

## 2019-05-20 MED ORDER — ALPRAZOLAM 0.5 MG PO TABS
0.5000 mg | ORAL_TABLET | Freq: Two times a day (BID) | ORAL | Status: DC | PRN
  Administered 2019-05-21: 0.5 mg via ORAL
  Filled 2019-05-20: qty 1

## 2019-05-20 MED ORDER — ARIPIPRAZOLE 5 MG PO TABS
5.0000 mg | ORAL_TABLET | Freq: Every day | ORAL | Status: DC
  Administered 2019-05-21: 5 mg via ORAL
  Filled 2019-05-20: qty 1

## 2019-05-20 MED ORDER — ROSUVASTATIN CALCIUM 20 MG PO TABS
20.0000 mg | ORAL_TABLET | Freq: Every day | ORAL | Status: DC
  Administered 2019-05-21: 20 mg via ORAL
  Filled 2019-05-20: qty 1

## 2019-05-20 MED ORDER — DOXEPIN HCL 50 MG PO CAPS
50.0000 mg | ORAL_CAPSULE | Freq: Every day | ORAL | Status: DC
  Administered 2019-05-21: 50 mg via ORAL
  Filled 2019-05-20 (×2): qty 1

## 2019-05-20 NOTE — ED Triage Notes (Signed)
Called EMS by son with c/o concerned that patient had taken too many Ambien.   Reported he witnessed that he was acting up and pt was trying to clean up and moved is son's stuff around. .  Pt takes his own medications, pt states he took 1/2 his Ambien x last 3 nights.  Pt has eratic and pressured speech.  Pt denies taking too many Ambien. Pt appears very anxious.  Pt denies any sx at this time.  No CP, SOB or GI sx.  States if he does anything out of the ordinary his son gets upset.  Alert and oriented x 4.

## 2019-05-20 NOTE — BH Assessment (Addendum)
Tele Assessment Note   Patient Name: Justin [name] MRN: [id number] Referring Physician: Dr. Dorie Rank Location of Patient: [facility] Location of Provider: [place] is an 73 y.o. male presenting for mental health evaluation. Patient was referred to ED by his son, whom felt patient took too many Ambien. Patient reported only taking half Ambien nightly and that he just got his medication changed to Ambien last week due to other medicine not working. Patient denied SI, HI and psychosis. No drug/alcohol usage reported. Patient reported being seen by Dr. Adele Schilder for medication management and Carlus Pavlov for outpatient therapy for depression grief/loss of wife's death 3 years ago. Patient also reported inpatient mental health treatment 3 years ago. Patient reported that all medications are working except for sleeping pills. Patient does have history of completing suicides in his family, please see Chaplains note. Patient reported "today is my birthday and I want to go home". Patient reported outpatient therapy has really helped him with coping skills and strengthened his relationship with his son and son's family. Patient reported living next door to son and being a part of his grand daughters life. Patient reported his grand daughters are his motivation and that is one of the reasons why he is not SI. Patient was pleasant and cooperative during assessment.   Collateral Contact: Patient denied.  PER TRIAGE NOTE: Called EMS by son with c/o concerned that patient had taken too many Ambien. Reported he witnessed that he was acting up and pt was trying to clean up and moved is son's stuff around. Pt takes his own medications, pt states he took 1/2 his Ambien x last 3 nights. Pt has eratic and pressured speech. Pt denies taking too many Ambien. Pt appears very anxious. Pt denies any sx at this time. No CP, SOB or GI sx. States if he does anything out of the ordinary his  son gets upset.  Alert and oriented x 4.   Diagnosis: Major depressive disorder  Past Medical History:  Past Medical History:  Diagnosis Date  . Anxiety and depression   . Arthritis    knees, c-spine // gets lumbar ESI q 6 mos  . Bipolar disorder ([place])   . CAD (coronary artery disease)    [place] 08/2009 >> [facility] ([facility]) - mLAD 70, Dx 65 >> PCI:  3x15 mm Endeavor DES to mLAD and 2.5x12 mm Endeavor DES to Dx // S/p NSTEMI >> LHC 8/12 ([facility]) - LM ok, LAD prox and mid 40; LAD stent ok, Dx stent ok, mRCA 30, mLCx 50 >> med Rx // ETT-Echo 2/17 ([facility]): Normal, EF 55-60 at rest  . Chicken pox   . 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 Grief   . History of echocardiogram    Echo 3/19: Mild concentric LVH, EF 60-65, normal wall motion, grade 1 diastolic dysfunction, mild AI, mildly dilated aortic root (40 mm), MAC, mild LAE, atrial septal lipomatous hypertrophy  . History of non-ST elevation myocardial infarction (NSTEMI)   . History of nuclear stress test    Nuclear stress test 3/19: EF 57, inferior/inferoseptal/inferolateral defect consistent with probable soft tissue attenuation (cannot exclude subendocardial scar), no ischemia, intermediate risk >> Echo 3/19 normal EF, normal wall motion  . History of pneumonia 2011  . History of prostatitis 1990s  . Hx of adenomatous colonic polyps 06/22/2016  . Hyperlipidemia   . Hypertension   . Migraines    prior history  . PTSD (post-traumatic stress  disorder)     Past Surgical History:  Procedure Laterality Date  . CERVICAL DISCECTOMY    . COLONOSCOPY  03/2017  . ELBOW SURGERY Left   . ELBOW SURGERY Right   . INGUINAL HERNIA REPAIR Bilateral    1996, 1997  . KNEE ARTHROSCOPY Right   . KNEE CARTILAGE SURGERY Left   . NASAL SEPTUM SURGERY    . STENT PLACEMENT VASCULAR ([place] HX)  09/02/2010  . TONSILLECTOMY    . TOTAL KNEE ARTHROPLASTY Left 08/31/2018   Procedure: TOTAL KNEE ARTHROPLASTY;  Surgeon: Dorna Leitz, MD;  Location: [facility];   Service: Orthopedics;  Laterality: Left;    Family History:  Family History  Problem Relation Age of Onset  . Arthritis Mother   . Heart disease Mother        s/p CABG  . Hypertension Mother   . Alzheimer's disease Mother   . Heart attack Mother   . Arthritis Father   . Hyperlipidemia Father   . Heart disease Father        s/p CABG  . Stroke Father   . Hypertension Father   . Heart attack Father   . Multiple sclerosis Sister   . Diabetes Sister   . Lung cancer Paternal Grandmother   . Diabetes Sister   . Heart attack Sister   . Pulmonary embolism Sister        died  . Lung cancer Maternal Aunt   . Lung cancer Paternal Aunt   . Depression Brother        suicide    Social History:  reports that he has never smoked. He has never used smokeless tobacco. He reports that he does not drink alcohol or use drugs.  Additional Social History:     CIWA: CIWA-Ar BP: (!) 141/98 Pulse Rate: (!) 104 COWS:    Allergies:  Allergies  Allergen Reactions  . Ambien [Zolpidem Tartrate] Other (See Comments)    Blackout, memory issues  . Seroquel [Quetiapine]     SEVERE NIGHTMARES, SLEEPWALK AND NIGHT DRIVE WITH NO RECOLLECTION UPON WAKENING    Home Medications: (Not in a hospital admission)   OB/GYN Status:  No LMP for male patient.  General Assessment Data Location of Assessment: [facility] ED TTS Assessment: In system Is this a Tele or Face-to-Face Assessment?: Tele Assessment Is this an Initial Assessment or a Re-assessment for this encounter?: Initial Assessment Patient Accompanied by:: N/A Language Other than English: No Living Arrangements: (alone) What gender do you identify as?: Male Marital status: Widowed Living Arrangements: Alone Can pt return to current living arrangement?: Yes Admission Status: Voluntary Is patient capable of signing voluntary admission?: Yes Referral Source: Self/Family/Friend     Crisis Care Plan Living Arrangements: Alone Legal Guardian:  (self) Name of Psychiatrist: (Dr. Adele Schilder) Name of Therapist: Janett Billow Catering manager)  Education Status Is patient currently in school?: No Is the patient employed, unemployed or receiving disability?: (retiered)  Risk to self with the past 6 months Suicidal Ideation: No Has patient been a risk to self within the past 6 months prior to admission? : No Suicidal Intent: No Has patient had any suicidal intent within the past 6 months prior to admission? : No Is patient at risk for suicide?: No Suicidal Plan?: No Has patient had any suicidal plan within the past 6 months prior to admission? : No Access to Means: No What has been your use of drugs/alcohol within the last 12 months?: (none) Previous Attempts/Gestures: (1997) How many times?: (1) Other Self  Harm Risks: (none reported) Triggers for Past Attempts: Unknown Intentional Self Injurious Behavior: None Family Suicide History: Yes Recent stressful life event(s): (grief loss wife death (2 yrs agi)) Persecutory voices/beliefs?: No Depression: Yes Depression Symptoms: Fatigue, Guilt, Loss of interest in usual pleasures Substance abuse history and/or treatment for substance abuse?: No Suicide prevention information given to non-admitted patients: Not applicable  Risk to Others within the past 6 months Homicidal Ideation: No Does patient have any lifetime risk of violence toward others beyond the six months prior to admission? : No Thoughts of Harm to Others: No Current Homicidal Intent: No Current Homicidal Plan: No Access to Homicidal Means: No History of harm to others?: No Assessment of Violence: None Noted Violent Behavior Description: (none reported) Does patient have access to weapons?: No Criminal Charges Pending?: No Does patient have a court date: No Is patient on probation?: No  Psychosis Hallucinations: None noted Delusions: None noted  Mental Status Report Appearance/Hygiene: Unremarkable Eye Contact:  Good Motor Activity: Freedom of movement Speech: Logical/coherent Level of Consciousness: Alert Mood: Pleasant Affect: Appropriate to circumstance Anxiety Level: None Thought Processes: Coherent, Relevant Judgement: Unimpaired Orientation: Person, Place, Time, Situation Obsessive Compulsive Thoughts/Behaviors: None  Cognitive Functioning Concentration: Normal Memory: Recent Intact Is patient IDD: No Insight: Fair Impulse Control: Poor Appetite: Poor Have you had any weight changes? : Loss Amount of the weight change? (lbs): (10lbs in 2 weeks) Sleep: Decreased Total Hours of Sleep: (4) Vegetative Symptoms: None  ADLScreening [facility] Assessment Services) Patient's cognitive ability adequate to safely complete daily activities?: Yes Patient able to express need for assistance with ADLs?: Yes Independently performs ADLs?: Yes (appropriate for developmental age)  Prior Inpatient Therapy Prior Inpatient Therapy: Yes Prior Therapy Dates: (1997) Prior Therapy Facilty/Provider(s): (unknown) Reason for Treatment: (depression)  Prior Outpatient Therapy Prior Outpatient Therapy: Yes Prior Therapy Dates: (present) Prior Therapy Facilty/Provider(s): (Dr. Adele Schilder and Kathryne Hitch. therapist) Reason for Treatment: (bipolar and depression) Does patient have an ACCT team?: No Does patient have Intensive In-House Services?  : No Does patient have [facility] services? : No Does patient have P4CC services?: No  ADL Screening (condition at time of admission) Patient's cognitive ability adequate to safely complete daily activities?: Yes Patient able to express need for assistance with ADLs?: Yes Independently performs ADLs?: Yes (appropriate for developmental age)  Regulatory affairs officer (For Healthcare) Does Patient Have a Medical Advance Directive?: Yes Does patient want to make changes to medical advance directive?: No - Patient declined Type of Advance Directive: Living will Copy of Living Will  in Chart?: No - copy requested, Physician notified Would patient like information on creating a medical advance directive?: No - Patient declined   Disposition:  Disposition Initial Assessment Completed for this Encounter: Yes  Lindon Romp, NP, recommends overnight observation for safety and stabilization with morning psych consult.   This service was provided via telemedicine using a 2-way, interactive audio and video technology.  Names of all persons participating in this telemedicine service and their role in this encounter. Name: Gaynelle Adu Role: Patient  Name: Kirtland Bouchard Role: TTS Clinician  Name:  Role:   Name:  Role:     Venora Maples 05/20/2019 8:58 PM
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
## 2019-05-20 NOTE — ED Notes (Signed)
Patient transported to CT 

## 2019-05-20 NOTE — ED Notes (Signed)
Paged chaplain

## 2019-05-20 NOTE — ED Notes (Signed)
Pt has reported in chart that he has sensitivity to Ambien with "blackouts".   He reports that it has been 10 years ago and has not used since until this weekend.  Pt states he has slept well and wakes easily with no known syncopal episodes or blackouts.

## 2019-05-20 NOTE — ED Provider Notes (Signed)
[facility] EMERGENCY DEPARTMENT Provider Note   CSN: [id number] Arrival date & time: 05/20/19  1619     History   Chief Complaint No chief complaint on file.   HPI Justin [name] is a 73 y.o. male.     HPI Pt states he saw his doctor this week and was prescribed ambien.  He has been taking half an [place] since that time and has been sleeping fine.  He denies any complaints in the ed and feels fine.  He does not think he needs to be here.  pt states his son called ems because he took too many ambien.  Pt denies this.  He told his son he did not need to be here but his son was insistent.  Pt denies any other medications.  Denies alcohol use.  Pt does see a psychiatrist, he has depression but denies SI or HI.   Past Medical History:  Diagnosis Date  . Anxiety and depression   . Arthritis    knees, c-spine // gets lumbar ESI q 6 mos  . Bipolar disorder ([place])   . CAD (coronary artery disease)    [place] 08/2009 >> LHC ([facility]) - mLAD 70, Dx 65 >> PCI:  3x15 mm Endeavor DES to mLAD and 2.5x12 mm Endeavor DES to Dx // S/p NSTEMI >> LHC 8/12 ([facility]) - LM ok, LAD prox and mid 40; LAD stent ok, Dx stent ok, mRCA 30, mLCx 50 >> med Rx // ETT-Echo 2/17 ([facility]): Normal, EF 55-60 at rest  . Chicken pox   . 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 Grief   . History of echocardiogram    Echo 3/19: Mild concentric LVH, EF 60-65, normal wall motion, grade 1 diastolic dysfunction, mild AI, mildly dilated aortic root (40 mm), MAC, mild LAE, atrial septal lipomatous hypertrophy  . History of non-ST elevation myocardial infarction (NSTEMI)   . History of nuclear stress test    Nuclear stress test 3/19: EF 57, inferior/inferoseptal/inferolateral defect consistent with probable soft tissue attenuation (cannot exclude subendocardial scar), no ischemia, intermediate risk >> Echo 3/19 normal EF, normal wall motion  . History of pneumonia 2011  . History of prostatitis 1990s  . Hx of  adenomatous colonic polyps 06/22/2016  . Hyperlipidemia   . Hypertension   . Migraines    prior history  . PTSD (post-traumatic stress disorder)     Patient Active Problem List   Diagnosis Date Noted  . Postoperative anemia due to acute blood loss 09/03/2018  . Primary osteoarthritis of left knee 08/31/2018  . Neck pain 12/08/2017  . Hyperlipidemia 10/30/2017  . Anemia 06/19/2017  . Hyperglycemia 06/19/2017  . Elevated prolactin level ([place]) 06/19/2017  . Closed fracture of right hand 06/19/2017  . Bipolar affective disorder, depressed, severe ([place]) 06/12/2017  . Benzodiazepine withdrawal with complication ([place]) 40/03/6760  . Nail lesion 02/25/2017  . Hx of adenomatous colonic polyps 06/22/2016  . Memory changes 06/09/2016  . CAD (coronary artery disease)   . Right knee pain 01/01/2016  . Olecranon bursitis of right elbow 12/10/2015  . Bipolar I disorder ([place]) 06/26/2015  . Routine general medical examination at a health care facility 03/26/2015  . Medicare annual wellness visit, subsequent 03/26/2015  . Rash and nonspecific skin eruption 03/13/2015  . Generalized anxiety disorder 12/23/2014  . Low back pain 12/23/2014  . Essential hypertension 12/23/2014    Past Surgical History:  Procedure Laterality Date  . CERVICAL DISCECTOMY    .  COLONOSCOPY  03/2017  . ELBOW SURGERY Left   . ELBOW SURGERY Right   . INGUINAL HERNIA REPAIR Bilateral    1996, 1997  . KNEE ARTHROSCOPY Right   . KNEE CARTILAGE SURGERY Left   . NASAL SEPTUM SURGERY    . STENT PLACEMENT VASCULAR ([place] [facility])  09/02/2010  . TONSILLECTOMY    . TOTAL KNEE ARTHROPLASTY Left 08/31/2018   Procedure: TOTAL KNEE ARTHROPLASTY;  Surgeon: Dorna Leitz, MD;  Location: [facility];  Service: Orthopedics;  Laterality: Left;        Home Medications    Prior to Admission medications   Medication Sig Start Date End Date Taking? Authorizing Provider  ALPRAZolam Duanne Moron) 0.5 MG tablet Take 1 tablet (0.5 mg total) by mouth  2 (two) times daily as needed for anxiety. 02/26/19 02/26/20  Arfeen, Arlyce Harman, MD  ARIPiprazole (ABILIFY) 5 MG tablet Take 1 tablet (5 mg total) by mouth daily. 02/26/19   Arfeen, Arlyce Harman, MD  clopidogrel (PLAVIX) 75 MG tablet Take 1 tablet (75 mg total) by mouth daily. 05/28/18 05/28/19  Richardson Dopp T, PA-C  diltiazem (CARTIA XT) 180 MG 24 hr capsule Take 1 capsule (180 mg total) by mouth daily. 06/16/17   Money, Lowry Ram, FNP  docusate sodium (COLACE) 100 MG capsule Take 1 capsule (100 mg total) by mouth 2 (two) times daily. 08/31/18   Gary Fleet, PA-C  doxepin (SINEQUAN) 50 MG capsule Take 1 capsule (50 mg total) by mouth at bedtime. 02/26/19 02/26/20  Arfeen, Arlyce Harman, MD  lamoTRIgine (LAMICTAL) 200 MG tablet Take 1 tablet (200 mg total) by mouth daily. For mood control 02/26/19   Arfeen, Arlyce Harman, MD  meloxicam [facility]) 15 MG tablet  01/28/19   [provider]  nitroGLYCERIN (NITROSTAT) 0.4 MG SL tablet Place 1 tablet (0.4 mg total) under the tongue every 5 (five) minutes as needed for chest pain. 05/01/18   Richardson Dopp T, PA-C  rosuvastatin (CRESTOR) 20 MG tablet Take 1 tablet by mouth once daily 11/13/18   Richardson Dopp T, PA-C  zolpidem (AMBIEN) 5 MG tablet Take 1 tablet (5 mg total) by mouth at bedtime as needed for sleep. 05/15/19   Biagio Borg, MD    Family History Family History  Problem Relation Age of Onset  . Arthritis Mother   . Heart disease Mother        s/p CABG  . Hypertension Mother   . Alzheimer's disease Mother   . Heart attack Mother   . Arthritis Father   . Hyperlipidemia Father   . Heart disease Father        s/p CABG  . Stroke Father   . Hypertension Father   . Heart attack Father   . Multiple sclerosis Sister   . Diabetes Sister   . Lung cancer Paternal Grandmother   . Diabetes Sister   . Heart attack Sister   . Pulmonary embolism Sister        died  . Lung cancer Maternal Aunt   . Lung cancer Paternal Aunt   . Depression Brother        suicide    Social  History Social History   Tobacco Use  . Smoking status: Never Smoker  . Smokeless tobacco: Never Used  Substance Use Topics  . Alcohol use: No    Alcohol/week: 0.0 standard drinks  . Drug use: No     Allergies   Ambien [zolpidem tartrate] and Seroquel [quetiapine]   Review of Systems Review  of Systems  All other systems reviewed and are negative.    Physical Exam Updated Vital Signs BP (!) 141/67   Pulse 90   Temp 98.5 F (36.9 C) (Oral)   Resp (!) 24   Ht 1.803 m (_0 )   Wt 77.1 kg   SpO2 99%   BMI 23.71 kg/m   Physical Exam Vitals signs and nursing note reviewed.  Constitutional:      General: He is not in acute distress.    Appearance: He is well-developed.  HENT:     Head: Normocephalic and atraumatic.     Right Ear: External ear normal.     Left Ear: External ear normal.  Eyes:     General: No scleral icterus.       Right eye: No discharge.        Left eye: No discharge.     Conjunctiva/sclera: Conjunctivae normal.  Neck:     Musculoskeletal: Neck supple.     Trachea: No tracheal deviation.  Cardiovascular:     Rate and Rhythm: Normal rate and regular rhythm.  Pulmonary:     Effort: Pulmonary effort is normal. No respiratory distress.     Breath sounds: Normal breath sounds. No stridor. No wheezing or rales.  Abdominal:     General: Bowel sounds are normal. There is no distension.     Palpations: Abdomen is soft.     Tenderness: There is no abdominal tenderness. There is no guarding or rebound.  Musculoskeletal:        General: No tenderness.  Skin:    General: Skin is warm and dry.     Findings: No rash.  Neurological:     Mental Status: He is alert and oriented to person, place, and time.     Cranial Nerves: No cranial nerve deficit (no facial droop, extraocular movements intact, no slurred speech).     Sensory: No sensory deficit.     Motor: No abnormal muscle tone or seizure activity.     Coordination: Coordination normal.      Comments: No pronator drift bilateral upper extrem, able to hold both legs off bed for 5 seconds, sensation intact in all extremities, no visual field cuts, no left or right sided neglect, normal finger-nose exam bilaterally, no nystagmus noted   Psychiatric:        Mood and Affect: Mood normal.        Behavior: Behavior normal.      ED Treatments / Results  Labs (all labs ordered are listed, but only abnormal results are displayed) Labs Reviewed  BASIC METABOLIC PANEL - Abnormal; Notable for the following components:      Result Value   Sodium 130 (*)    Potassium 3.4 (*)    Chloride 97 (*)    Glucose, Bld 118 (*)    BUN 5 (*)    All other components within normal limits  URINALYSIS, ROUTINE W REFLEX MICROSCOPIC - Abnormal; Notable for the following components:   Color, Urine STRAW (*)    Specific Gravity, Urine 1.002 (*)    All other components within normal limits  SARS CORONAVIRUS 2 (HOSPITAL ORDER, [place] [facility])  CBC  ETHANOL  RAPID URINE DRUG SCREEN, HOSP PERFORMED    EKG EKG Interpretation  Date/Time:  Monday May 20 2019 16:19:32 EDT Ventricular Rate:  99 PR Interval:    QRS Duration: 98 QT Interval:  353 QTC Calculation: 453 R Axis:   -61 Text Interpretation:  Sinus rhythm Left anterior fascicular block Poor R wave progression , new since last tracing Confirmed by Dorie Rank [phone]) on 05/20/2019 4:33:10 PM   Radiology Ct Head Wo Contrast  Result Date: 05/20/2019 CLINICAL DATA:  Altered LOC EXAM: CT HEAD WITHOUT CONTRAST TECHNIQUE: Contiguous axial images were obtained from the base of the skull through the vertex without intravenous contrast. COMPARISON:  CT brain 04/04/2013 FINDINGS: Brain: No acute territorial infarction, hemorrhage, or intracranial mass. Atrophy. Stable ventricle size. Vascular: No hyperdense vessels.  Vertebral artery calcification Skull: Normal. Negative for fracture or focal lesion. Sinuses/Orbits: No acute  finding. Other: None IMPRESSION: No CT evidence for acute intracranial abnormality.  Atrophy Electronically Signed   By: Donavan Foil M.D.   On: 05/20/2019 21:48    Procedures Procedures (including critical care time)  Medications Ordered in ED Medications  ALPRAZolam (XANAX) tablet 0.5 mg (has no administration in time range)  ARIPiprazole (ABILIFY) tablet 5 mg (has no administration in time range)  clopidogrel (PLAVIX) tablet 75 mg (has no administration in time range)  diltiazem (CARDIZEM CD) 24 hr capsule 180 mg (has no administration in time range)  docusate sodium (COLACE) capsule 100 mg (has no administration in time range)  doxepin (SINEQUAN) capsule 50 mg (has no administration in time range)  lamoTRIgine (LAMICTAL) tablet 200 mg (has no administration in time range)  rosuvastatin (CRESTOR) tablet 20 mg (has no administration in time range)     Initial Impression / Assessment and Plan / ED Course  I have reviewed the triage vital signs and the nursing notes.  Pertinent labs & imaging results that were available during my care of the patient were reviewed by me and considered in my medical decision making (see chart for details).  Clinical Course as of May 19 2218  Mon May 20, 2019  1709 Additional history from son.  Son called pt because it was his birthday.   Pt seemed confused, forgot he had spoken to his son earlier in the week.  Pt showed up at son's house and he was not making a lot of sense.  He went back to his house and then called his son.  Pt thought there were some issues in his house.  Son went to check father and his house, and he had pulled his tv and cable box and had them sitting in the middle of the door through several rooms and there was clothing all over the house.  When asked whey his TV stand, tv and cable box were in the other room, pt stated he was taking them to the dump.  These items were functioning appropriately.  Son is concerned that he is having  issues with his mental health and medications.  This has happened to him before.    [JK]  1857 Chaplain note reviewed.  Patient did mention depression, currently denies active suicidal ideation.  We will continue with psychiatric evaluation.   [JK]    Clinical Course User Index [JK] Dorie Rank, MD     Labs reviewed.  No significant abnormalities.  CT scan without acute findings.  Patient is medically cleared. Psychiatric evaluation recommended overnight observation with morning psych consult. Final Clinical Impressions(s) / ED Diagnoses   Final diagnoses:  Depression, unspecified depression type    ED Discharge Orders    None       Dorie Rank, MD 05/20/19 2220

## 2019-05-20 NOTE — Progress Notes (Signed)
Chaplain called to room to offer presence to patient. The Visit lasted an hour. Today is the patient's 53 birthday.  Patient is suffering from complicated grief from death of wife three years ago and death of dog two years ago.  He has multiple risk factors for being a suicide risk.  He has spent time in behavioral heath before.   He reported he blacked out the week of his wife's death and was not present for her cremation.  Patient also took a bottle of pills once with beer and and nearly died.  He related the story of his near death experience from that overdose.  He had a sister who died eventually from  a self-inflicted shot wound she suffered to the abdomen. Patient's younger brother died of suicide at age 10. Patient also said his father (with whom he shares this birthday date said of him: "that boy is gonna be the death of me, and then he died."  Patient said "I just want to go to sleep and not wake up." Chaplain learned that patient is very pleasant but socially isolated.  All siblings have died now but one younger sister.  His relationship with his son is complicated.  He reported he was banned from hospital around the time of his wife's deaths due to a threat that was said to his son's wife which he cannot remember.    Chaplain reported this all to the staff that requested this visit, noting he was not named as a "suicide risk" yet in the chart. Rev. Tamsen Snider Pager [phone]

## 2019-05-21 ENCOUNTER — Other Ambulatory Visit: Payer: Self-pay

## 2019-05-21 LAB — SARS CORONAVIRUS 2 BY RT PCR (HOSPITAL ORDER, PERFORMED IN ~~LOC~~ HOSPITAL LAB): SARS Coronavirus 2: NEGATIVE

## 2019-05-21 NOTE — ED Notes (Signed)
Ordered bfast 

## 2019-05-21 NOTE — ED Notes (Signed)
Breakfast tray ordered 

## 2019-05-21 NOTE — ED Notes (Signed)
Telepsych being performed. 

## 2019-05-21 NOTE — ED Notes (Signed)
Pt aware son advised he will be here to pick him up around 1200. Pt given Xanax as requested d/t "feeling anxious".

## 2019-05-21 NOTE — Discharge Instructions (Signed)
You were seen in the emergency department today after taking your Ambien inappropriately.  Please take this medication only as directed.  Discussed with your primary care physician.  If you begin to feel depression or thoughts of harming yourself or others please return to the emergency department immediately or call 911.

## 2019-05-21 NOTE — Progress Notes (Signed)
CSW spoke with pt's son, Emerzon Vanderwood, and informed him of pt's disposition. CSW assessed that pt's behaviors have been happening, "all his life". "Dad has never wanted any of [place] to help with his medications; he likes to be in control of them". Pt's son also stated that he lives next door to his father and is able to check on him several times daily.   [facility] NP has been notified.   Audree Camel, LCSW, [place] Disposition [facility]/TTS [phone]

## 2019-05-21 NOTE — ED Provider Notes (Signed)
Blood pressure (!) 136/92, pulse 78, temperature 97.9 F (36.6 C), temperature source Oral, resp. rate 18, height 5\' 11"  (1.803 m), weight 77.1 kg, SpO2 97 %.  In short, Justin [name] is a 73 y.o. male with a chief complaint of No chief complaint on file. Marland Kitchen  Refer to the original H&P for additional details.  10:25 AM  Patient evaluated by TTS this morning.  Patient denies SI/HI.  He peers to have taken too many Ambien which led to this presentation.  Discussed the appropriate dosing of his Ambien medications.  TTS advises that the patient is clear for discharge at this time with plan for PCP follow-up.     Margette Fast, MD 05/21/19 1027

## 2019-05-21 NOTE — Consult Note (Signed)
Telepsych Consultation   Reason for Consult:  "took too many Ambien" Referring Physician:  EDP Location of Patient: [facility] Location of Provider: [place] [facility]  Patient Identification: Justin [name] MRN:  [id number] Principal Diagnosis: Memory changes Diagnosis:  Principal Problem:   Memory changes   Total Time spent with patient: 20 minutes  Subjective:   Justin [name] is a 73 y.o. male patient admitted with "took too many Ambien." Patient alert and oriented for assessment, answers appropriately/ Patient denies suicidal intent, states, "I was having trouble sleeping, my primary care doctor prescribed me Ambien, I had similar episode when taking Ambien about ten years ago. Patient states "I will not take Ambien anymore." Patient agrees to have son, Justin [name] contacted regarding medication management. Patient lives alone, next door to son, Justin [name], who is supportive. Patient denies suicidal and homicidal ideations today. Patient denies auditory and visual hallucinations. Patient followed outpatient by Dr Adele Schilder at [facility], history of bipolar and PTSD.   HPI:  Per TTS assessment: Justin [name] is an 73 y.o. male presenting for mental health evaluation. Patient was referred to ED by his son, whom felt patient took too many Ambien. Patient reported only taking half Ambien nightly and that he just got his medication changed to Ambien last week due to other medicine not working. Patient denied SI, HI and psychosis. No drug/alcohol usage reported.  Past Psychiatric History: bipolar, PTSD  Risk to Self: Suicidal Ideation: No Suicidal Intent: No Is patient at risk for suicide?: No Suicidal Plan?: No Access to Means: No What has been your use of drugs/alcohol within the last 12 months?: (none) How many times?: (1) Other Self Harm Risks: (none reported) Triggers for Past Attempts: Unknown Intentional Self Injurious Behavior: None Risk to Others:  Homicidal Ideation: No Thoughts of Harm to Others: No Current Homicidal Intent: No Current Homicidal Plan: No Access to Homicidal Means: No History of harm to others?: No Assessment of Violence: None Noted Violent Behavior Description: (none reported) Does patient have access to weapons?: No Criminal Charges Pending?: No Does patient have a court date: No Prior Inpatient Therapy: Prior Inpatient Therapy: Yes Prior Therapy Dates: (1997) Prior Therapy Facilty/Provider(s): (unknown) Reason for Treatment: (depression) Prior Outpatient Therapy: Prior Outpatient Therapy: Yes Prior Therapy Dates: (present) Prior Therapy Facilty/Provider(s): (Dr. Adele Schilder and Kathryne Hitch therapist) Reason for Treatment: (bipolar and depression) Does patient have an ACCT team?: No Does patient have Intensive In-House Services?  : No Does patient have [facility] services? : No Does patient have P4CC services?: No  Past Medical History:  Past Medical History:  Diagnosis Date  . Anxiety and depression   . Arthritis    knees, c-spine // gets lumbar ESI q 6 mos  . Bipolar disorder ([place])   . CAD (coronary artery disease)    [place] 08/2009 >> LHC ([facility]) - mLAD 70, Dx 65 >> PCI:  3x15 mm Endeavor DES to mLAD and 2.5x12 mm Endeavor DES to Dx // S/p NSTEMI >> LHC 8/12 ([facility]) - LM ok, LAD prox and mid 40; LAD stent ok, Dx stent ok, mRCA 30, mLCx 50 >> med Rx // ETT-Echo 2/17 ([facility]): Normal, EF 55-60 at rest  . Chicken pox   . 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 Grief   . History of echocardiogram    Echo 3/19: Mild concentric LVH, EF 60-65, normal wall motion, grade 1 diastolic dysfunction, mild AI, mildly dilated aortic root (40 mm), MAC, mild LAE, atrial  septal lipomatous hypertrophy  . History of non-ST elevation myocardial infarction (NSTEMI)   . History of nuclear stress test    Nuclear stress test 3/19: EF 57, inferior/inferoseptal/inferolateral defect consistent with probable soft tissue attenuation  (cannot exclude subendocardial scar), no ischemia, intermediate risk >> Echo 3/19 normal EF, normal wall motion  . History of pneumonia 2011  . History of prostatitis 1990s  . Hx of adenomatous colonic polyps 06/22/2016  . Hyperlipidemia   . Hypertension   . Migraines    prior history  . PTSD (post-traumatic stress disorder)     Past Surgical History:  Procedure Laterality Date  . CERVICAL DISCECTOMY    . COLONOSCOPY  03/2017  . ELBOW SURGERY Left   . ELBOW SURGERY Right   . INGUINAL HERNIA REPAIR Bilateral    1996, 1997  . KNEE ARTHROSCOPY Right   . KNEE CARTILAGE SURGERY Left   . NASAL SEPTUM SURGERY    . STENT PLACEMENT VASCULAR ([facility])  09/02/2010  . TONSILLECTOMY    . TOTAL KNEE ARTHROPLASTY Left 08/31/2018   Procedure: TOTAL KNEE ARTHROPLASTY;  Surgeon: Dorna Leitz, MD;  Location: [facility];  Service: Orthopedics;  Laterality: Left;   Family History:  Family History  Problem Relation Age of Onset  . Arthritis Mother   . Heart disease Mother        s/p CABG  . Hypertension Mother   . Alzheimer's disease Mother   . Heart attack Mother   . Arthritis Father   . Hyperlipidemia Father   . Heart disease Father        s/p CABG  . Stroke Father   . Hypertension Father   . Heart attack Father   . Multiple sclerosis Sister   . Diabetes Sister   . Lung cancer Paternal Grandmother   . Diabetes Sister   . Heart attack Sister   . Pulmonary embolism Sister        died  . Lung cancer Maternal Aunt   . Lung cancer Paternal Aunt   . Depression Brother        suicide   Family Psychiatric  History: unknown Social History:  Social History   Substance and Sexual Activity  Alcohol Use No  . Alcohol/week: 0.0 standard drinks     Social History   Substance and Sexual Activity  Drug Use No    Social History   Socioeconomic History  . Marital status: Widowed    Spouse name: Not on file  . Number of children: 2  . Years of education: 22  . Highest education level: Not  on file  Occupational History  . Occupation: Retired  Scientific laboratory technician  . Financial resource strain: Somewhat hard  . Food insecurity    Worry: Sometimes true    Inability: Sometimes true  . Transportation needs    Medical: No    Non-medical: No  Tobacco Use  . Smoking status: Never Smoker  . Smokeless tobacco: Never Used  Substance and Sexual Activity  . Alcohol use: No    Alcohol/week: 0.0 standard drinks  . Drug use: No  . Sexual activity: Not Currently  Lifestyle  . Physical activity    Days per week: 0 days    Minutes per session: 0 min  . Stress: Rather much  Relationships  . Social connections    Talks on phone: More than three times a week    Gets together: More than three times a week    Attends religious service: Never  Active member of club or organization: No    Attends meetings of clubs or organizations: Never    Relationship status: Widowed  Other Topics Concern  . Not on file  Social History Narrative   Retired, widowed in 2017    1 son / 1 daughter   2 caffeinated beverages daily no alcohol or tobacco   Fun: Work out in the yard.   Denies religious beliefs effecting healthcare.    Worked at [employer] for 30 years   Additional Social History:    Allergies:   Allergies  Allergen Reactions  . Ambien [Zolpidem Tartrate] Other (See Comments)    Blackout, memory issues  . Seroquel [Quetiapine]     SEVERE NIGHTMARES, SLEEPWALK AND NIGHT DRIVE WITH NO RECOLLECTION UPON WAKENING    Labs:  Results for orders placed or performed during the hospital encounter of 05/20/19 (from the past 48 hour(s))  CBC     Status: None   Collection Time: 05/20/19  4:41 PM  Result Value Ref Range   WBC 4.7 4.0 - 10.5 K/uL   RBC 4.32 4.22 - 5.81 MIL/uL   Hemoglobin 14.1 13.0 - 17.0 g/dL   HCT 40.7 39.0 - 52.0 %   MCV 94.2 80.0 - 100.0 fL   MCH 32.6 26.0 - 34.0 pg   MCHC 34.6 30.0 - 36.0 g/dL   RDW 11.7 11.5 - 15.5 %   Platelets 223 150 - 400 K/uL   nRBC 0.0 0.0 -  0.2 %    Comment: Performed at [facility], [place] [address], [place], [place] [address]  Basic metabolic panel     Status: Abnormal   Collection Time: 05/20/19  4:41 PM  Result Value Ref Range   Sodium 130 (L) 135 - 145 mmol/L   Potassium 3.4 (L) 3.5 - 5.1 mmol/L   Chloride 97 (L) 98 - 111 mmol/L   CO2 25 22 - 32 mmol/L   Glucose, Bld 118 (H) 70 - 99 mg/dL   BUN 5 (L) 8 - 23 mg/dL   Creatinine, Ser 0.89 0.61 - 1.24 mg/dL   Calcium 9.1 8.9 - 10.3 mg/dL   GFR calc non Af Amer >60 >60 mL/min   GFR calc Af Amer >60 >60 mL/min   Anion gap 8 5 - 15    Comment: Performed at [facility] [address] [place], [place] [address]  Ethanol     Status: None   Collection Time: 05/20/19  4:41 PM  Result Value Ref Range   Alcohol, Ethyl (B) <10 <10 mg/dL    Comment: (NOTE) Lowest detectable limit for serum alcohol is 10 mg/dL. For medical purposes only. Performed at [facility], [place] [address], [place], [place] [address]   Rapid urine drug screen (hospital performed)     Status: None   Collection Time: 05/20/19  5:10 PM  Result Value Ref Range   Opiates NONE DETECTED NONE DETECTED   Cocaine NONE DETECTED NONE DETECTED   Benzodiazepines NONE DETECTED NONE DETECTED   Amphetamines NONE DETECTED NONE DETECTED   Tetrahydrocannabinol NONE DETECTED NONE DETECTED   Barbiturates NONE DETECTED NONE DETECTED    Comment: (NOTE) DRUG SCREEN FOR MEDICAL PURPOSES ONLY.  IF CONFIRMATION IS NEEDED FOR ANY PURPOSE, NOTIFY LAB WITHIN 5 DAYS. LOWEST DETECTABLE LIMITS FOR URINE DRUG SCREEN Drug Class                     Cutoff (ng/mL) Amphetamine and metabolites  1000 Barbiturate and metabolites    200 Benzodiazepine                 378 Tricyclics and metabolites     300 Opiates and metabolites        300 Cocaine and metabolites        300 THC                            50 Performed at [facility], [place] [address], [place], [place] [address]   Urinalysis, Routine w  reflex microscopic     Status: Abnormal   Collection Time: 05/20/19  5:10 PM  Result Value Ref Range   Color, Urine STRAW (A) YELLOW   APPearance CLEAR CLEAR   Specific Gravity, Urine 1.002 (L) 1.005 - 1.030   pH 7.0 5.0 - 8.0   Glucose, UA NEGATIVE NEGATIVE mg/dL   Hgb urine dipstick NEGATIVE NEGATIVE   Bilirubin Urine NEGATIVE NEGATIVE   Ketones, ur NEGATIVE NEGATIVE mg/dL   Protein, ur NEGATIVE NEGATIVE mg/dL   Nitrite NEGATIVE NEGATIVE   Leukocytes,Ua NEGATIVE NEGATIVE    Comment: Performed at [facility] [address], [place], [place] [address]  SARS Coronavirus 2 [facility] order, Performed in [facility] hospital lab) Nasopharyngeal Nasopharyngeal Swab     Status: None   Collection Time: 05/20/19 10:56 PM   Specimen: Nasopharyngeal Swab  Result Value Ref Range   SARS Coronavirus 2 NEGATIVE NEGATIVE    Comment: (NOTE) If result is NEGATIVE SARS-CoV-2 target nucleic acids are NOT DETECTED. The SARS-CoV-2 RNA is generally detectable in upper and lower  respiratory specimens during the acute phase of infection. The lowest  concentration of SARS-CoV-2 viral copies this assay can detect is 250  copies / mL. A negative result does not preclude SARS-CoV-2 infection  and should not be used as the sole basis for treatment or other  patient management decisions.  A negative result may occur with  improper specimen collection / handling, submission of specimen other  than nasopharyngeal swab, presence of viral mutation(s) within the  areas targeted by this assay, and inadequate number of viral copies  (<250 copies / mL). A negative result must be combined with clinical  observations, patient history, and epidemiological information. If result is POSITIVE SARS-CoV-2 target nucleic acids are DETECTED. The SARS-CoV-2 RNA is generally detectable in upper and lower  respiratory specimens dur ing the acute phase of infection.  Positive  results are indicative of active infection  with SARS-CoV-2.  Clinical  correlation with patient history and other diagnostic information is  necessary to determine patient infection status.  Positive results do  not rule out bacterial infection or co-infection with other viruses. If result is PRESUMPTIVE POSTIVE SARS-CoV-2 nucleic acids MAY BE PRESENT.   A presumptive positive result was obtained on the submitted specimen  and confirmed on repeat testing.  While 2019 novel coronavirus  (SARS-CoV-2) nucleic acids may be present in the submitted sample  additional confirmatory testing may be necessary for epidemiological  and / or clinical management purposes  to differentiate between  SARS-CoV-2 and other Sarbecovirus currently known to infect humans.  If clinically indicated additional testing with an alternate test  methodology [phone]) is advised. The SARS-CoV-2 RNA is generally  detectable in upper and lower respiratory sp ecimens during the acute  phase of infection. The expected result is Negative. Fact Sheet for Patients:  StrictlyIdeas.no Fact Sheet for Healthcare  Providers: BankingDealers.co.za This test is not yet approved or cleared by the [place] and has been authorized for detection and/or diagnosis of SARS-CoV-2 by FDA under an Emergency Use Authorization (EUA).  This EUA will remain in effect (meaning this test can be used) for the duration of the COVID-19 declaration under Section 564(b)(1) of the Act, 21 U.S.C. section 360bbb-3(b)(1), unless the authorization is terminated or revoked sooner. Performed at [facility], [place] [address] [place], [place] [address]     Medications:  Current Facility-Administered Medications  Medication Dose Route Frequency Provider Last Rate Last Dose  . ALPRAZolam Duanne Moron) tablet 0.5 mg  0.5 mg Oral BID PRN Dorie Rank, MD      . ARIPiprazole (ABILIFY) tablet 5 mg  5 mg Oral Daily Dorie Rank, MD      . clopidogrel  (PLAVIX) tablet 75 mg  75 mg Oral Daily Dorie Rank, MD      . diltiazem (CARDIZEM CD) 24 hr capsule 180 mg  180 mg Oral Daily Dorie Rank, MD      . docusate sodium (COLACE) capsule 100 mg  100 mg Oral BID Dorie Rank, MD   100 mg at 05/21/19 0058  . doxepin (SINEQUAN) capsule 50 mg  50 mg Oral QHS Dorie Rank, MD   50 mg at 05/21/19 0059  . lamoTRIgine (LAMICTAL) tablet 200 mg  200 mg Oral Daily Dorie Rank, MD      . rosuvastatin (CRESTOR) tablet 20 mg  20 mg Oral Daily Dorie Rank, MD       Current Outpatient Medications  Medication Sig Dispense Refill  . ALPRAZolam (XANAX) 0.5 MG tablet Take 1 tablet (0.5 mg total) by mouth 2 (two) times daily as needed for anxiety. 60 tablet 2  . ARIPiprazole (ABILIFY) 5 MG tablet Take 1 tablet (5 mg total) by mouth daily. 30 tablet 2  . clopidogrel (PLAVIX) 75 MG tablet Take 1 tablet (75 mg total) by mouth daily. 30 tablet 11  . diltiazem (CARTIA XT) 180 MG 24 hr capsule Take 1 capsule (180 mg total) by mouth daily. 30 capsule 0  . docusate sodium (COLACE) 100 MG capsule Take 1 capsule (100 mg total) by mouth 2 (two) times daily. 30 capsule 0  . doxepin (SINEQUAN) 50 MG capsule Take 1 capsule (50 mg total) by mouth at bedtime. 30 capsule 2  . lamoTRIgine (LAMICTAL) 200 MG tablet Take 1 tablet (200 mg total) by mouth daily. For mood control 30 tablet 2  . meloxicam (MOBIC) 15 MG tablet     . nitroGLYCERIN (NITROSTAT) 0.4 MG SL tablet Place 1 tablet (0.4 mg total) under the tongue every 5 (five) minutes as needed for chest pain. 25 tablet 11  . rosuvastatin (CRESTOR) 20 MG tablet Take 1 tablet by mouth once daily 90 tablet 1  . zolpidem (AMBIEN) 5 MG tablet Take 1 tablet (5 mg total) by mouth at bedtime as needed for sleep. 30 tablet 5    Musculoskeletal: Strength & Muscle Tone: within normal limits Gait & Station: unable to assess Patient leans: unable to assess  Psychiatric Specialty Exam: Physical Exam  Nursing note and vitals  reviewed. Constitutional: He is oriented to person, place, and time. He appears well-developed.  HENT:  Head: Normocephalic.  Neck: Normal range of motion.  Cardiovascular: Normal rate.  Respiratory: Effort normal.  Musculoskeletal: Normal range of motion.  Neurological: He is alert and oriented to person, place, and time.    Review of Systems  Constitutional:  Negative.   HENT: Negative.   Eyes: Negative.   Respiratory: Negative.   Cardiovascular: Negative.   Gastrointestinal: Negative.   Genitourinary: Negative.   Musculoskeletal: Negative.   Skin: Negative.   Neurological: Negative.   Endo/Heme/Allergies: Negative.   Psychiatric/Behavioral: Negative.     Blood pressure (!) 136/92, pulse 78, temperature 97.9 F (36.6 C), temperature source Oral, resp. rate 18, height _0  (1.803 m), weight 77.1 kg, SpO2 97 %.Body mass index is 23.71 kg/m.  General Appearance: Casual  Eye Contact:  Good  Speech:  Clear and Coherent  Volume:  Normal  Mood:  Euthymic  Affect:  Appropriate and Congruent  Thought Process:  Coherent and Descriptions of Associations: Intact  Orientation:  Full (Time, Place, and Person)  Thought Content:  Logical  Suicidal Thoughts:  No  Homicidal Thoughts:  No  Memory:  Immediate;   Fair Recent;   Fair Remote;   Fair  Judgement:  Good  Insight:  Good  Psychomotor Activity:  Normal  Concentration:  Concentration: Good and Attention Span: Good  Recall:  Good  Fund of Knowledge:  Good  Language:  Good  Akathisia:  No  Handed:  Right  AIMS (if indicated):     Assets:  Agricultural consultant Housing Social Support  ADL's:  Intact  Cognition:  WNL  Sleep:        Treatment Plan Summary: Plan discharge home, follow up with established outpatient.  Disposition: No evidence of imminent risk to self or others at present.   Recommend psychiatric Inpatient admission when medically cleared. Supportive therapy provided about  ongoing stressors. Discussed crisis plan, support from social network, calling 911, coming to the Emergency Department, and calling Suicide Hotline.  This service was provided via telemedicine using a 2-way, interactive audio and video technology.  Names of all persons participating in this telemedicine service and their role in this encounter. Name: Gaynelle Adu Role: Patient   Name: Theresa Duty (via telephone) Role: Patient's son  Name: Letitia Libra  Role: [place], [place] 05/21/2019 10:11 AM

## 2019-05-21 NOTE — ED Notes (Signed)
ALL belongings - 1 labeled belongings bag, home med, and valuables envelope - returned to pt - Pt signed verifying all items present. D/C instructions given - pt voiced understanding to stop Ambien until he speaks w/PCP. Pt's son also aware.

## 2019-05-28 ENCOUNTER — Other Ambulatory Visit: Payer: Self-pay

## 2019-05-28 ENCOUNTER — Ambulatory Visit (INDEPENDENT_AMBULATORY_CARE_PROVIDER_SITE_OTHER): Payer: Medicare Other | Admitting: Licensed Clinical Social Worker

## 2019-05-28 ENCOUNTER — Encounter (HOSPITAL_COMMUNITY): Payer: Self-pay | Admitting: Licensed Clinical Social Worker

## 2019-05-28 DIAGNOSIS — F3162 Bipolar disorder, current episode mixed, moderate: Secondary | ICD-10-CM

## 2019-05-28 NOTE — Progress Notes (Signed)
Virtual Visit via Telephone Note  I connected with Justin [name] on 05/28/19 at 11:00 AM EDT by telephone and verified that I am speaking with the correct person using two identifiers.    I discussed the limitations, risks, security and privacy concerns of performing an evaluation and management service by telephone and the availability of in person appointments. I also discussed with the patient that there may be a patient responsible charge related to this service. The patient expressed understanding and agreed to proceed.  Type of Therapy: Individual Therapy  Treatment Goals addressed: improve psychiatric symptoms, Controlled Behavior, Moderated Mood, Deliberative Speech (improved social functioning), Improve Unhelpful Thought Patterns, Emotional Regulation Skills (Moderate moods, anger management, stress management), Feel and express a full Range of Emotions, Learn about Diagnosis, Healthy Coping Skills, Recall the Traumatic event without being overwhelmed  Interventions: CBT, Motivational Interviewing, grounding techniques and mindfulness techniques, and psychoeducation  Summary: Justin [name] is a 72 y.o. male who presents with Bipolar 1 Disorder, Mixed, Moderate  Suicidal/Homicidal: No - without intent/plan  Therapist Response:  [place] met with clinician for an individual session. Justin [name] discussed his psychiatric symptoms, his current life events and his homework. He reports he has been doing a little better. Just after our last session, Justin [name] reported he had an appointment with his PCP, who prescribed him Ambien for sleep. At the previous session, Justin [name] reported sleep disturbance and possible concern of becoming manic. Justin [name] reports he was so desperate for sleep, he agreed to try Ambien, even though he had blacked out from it in the past. He reports the first few days he slept well and had no problems. However, after that, he took it and he did some bizarre things (see ED note) and  his son ended up sending him to the hospital. Justin [name] reports now that he is off the ambien, he continues to have trouble sleeping. In session, his speech was fast and pressured, he cursed more than usual, and reported waking hourly, not being able to sleep before 2-3 am, and waking up after only a few hours. He was also very paranoid about his relationship with his son and grandchildren, worried that someone was trying to break into his house because a screw had come loose on the screen door, and started remembering bad things that people did to him 30 years ago. Clinician challenged thoughts using CBT. Clinician also reminded Justin [name] about thought stopping and reality testing techniques.   Clinician noted that Justin [name] has an appointment with Dr. Arfeen tomorrow and encouraged Justin [name] to be open about his concerns.  Plan: Return again in 1 -2 weeks.  Diagnosis: Axis I: Bipolar 1 Disorder, Mixed, Moderate     I discussed the assessment and treatment plan with the patient. The patient was provided an opportunity to ask questions and all were answered. The patient agreed with the plan and demonstrated an understanding of the instructions.   The patient was advised to call back or seek an in-person evaluation if the symptoms worsen or if the condition fails to improve as anticipated.  I provided 55 minutes of non-face-to-face time during this encounter.   Jessica R Schlosberg, LCSW  

## 2019-05-29 ENCOUNTER — Encounter (HOSPITAL_COMMUNITY): Payer: Self-pay | Admitting: Psychiatry

## 2019-05-29 ENCOUNTER — Ambulatory Visit (INDEPENDENT_AMBULATORY_CARE_PROVIDER_SITE_OTHER): Payer: Medicare Other | Admitting: Psychiatry

## 2019-05-29 ENCOUNTER — Other Ambulatory Visit: Payer: Self-pay

## 2019-05-29 DIAGNOSIS — F4310 Post-traumatic stress disorder, unspecified: Secondary | ICD-10-CM | POA: Diagnosis not present

## 2019-05-29 DIAGNOSIS — F411 Generalized anxiety disorder: Secondary | ICD-10-CM

## 2019-05-29 DIAGNOSIS — F3162 Bipolar disorder, current episode mixed, moderate: Secondary | ICD-10-CM

## 2019-05-29 MED ORDER — CLONAZEPAM 0.5 MG PO TABS: 0.5000 mg | ORAL_TABLET | Freq: Two times a day (BID) | ORAL | 0 refills | Status: DC

## 2019-05-29 MED ORDER — LAMOTRIGINE 200 MG PO TABS: 200.0000 mg | ORAL_TABLET | Freq: Every day | ORAL | 0 refills | Status: DC

## 2019-05-29 MED ORDER — ARIPIPRAZOLE 10 MG PO TABS: 10.0000 mg | ORAL_TABLET | Freq: Every day | ORAL | 0 refills | Status: DC

## 2019-05-29 NOTE — Progress Notes (Signed)
Virtual Visit via Telephone Note  I connected with Justin [name] on 05/29/19 at 11:00 AM EDT by telephone and verified that I am speaking with the correct person using two identifiers.   I discussed the limitations, risks, security and privacy concerns of performing an evaluation and management service by telephone and the availability of in person appointments. I also discussed with the patient that there may be a patient responsible charge related to this service. The patient expressed understanding and agreed to proceed.   History of Present Illness: Patient was evaluated by phone session.  He was seen in the psychiatric emergency room 2 weeks ago after taking Ambien overdose.  Patient do not recall it was overdose but later admitted take more than he was prescribed to get sleep.  Ambien was given by his primary care physician Dr. Cathlean Cower.  We have given Sinequan 50 mg and he does not feel it is working.  Patient told he has been lately upset because the way he was treated by his son and daughter-in-law.  He admitted irritability, mood swing, racing thoughts and could not sleep.  He admitted sometimes nightmares and flashbacks.  He is taking Abilify, Lamictal, Xanax.  He was prescribed Sinequan which he stopped.  Since he left the ER he has not taking Ambien.  Patient was not admitted to the hospital because he did not have any suicidal thoughts and does not require to be admitted under IVC.  Yesterday he had a session with his therapist Janett Billow.  Patient admitted he was very emotional yesterday and having pressured speech and manic.  He was talking about his family situation.  Patient feels that some time people play games with him.  He is not happy because he was supposed to keep the grandchild so the son and daughter-in-law take other child to the doctors appointment.  However he was told at the last minute change of plan and he was not happy about it.  Though he believes his biggest issue is lack  of sleep but I explained it could be underlying mood disorder and we may need to try a higher dose of Abilify.  He is taking Xanax but he feels sometimes it makes not working and he feels very anxious.  Though he denies any hallucination or any suicidal thoughts but admitted some time paranoid.  He is not drinking or using any illegal substances.  He has no rash or any itching.  He has chronic insomnia and he recall having sleep study but do not remember the details.  In the past he had tried numerous psychotropic medication for sleep and some of them he recall causes hallucination, grogginess and some did not work.   Past Psychiatric History:Reviewed. H/O suicidal attemptby taking overdose on Ambien, hydroxyzine and alcohol. H/Obipolar disorder with multiple inpatient. DidIOP. [facility]. Tried Zyprexa, Cymbalta, Seroquel, Vistaril, Ambien, Neurontin, WellbutrinandTrileptal.  Tried low-dose doxepin.  We tried trazodone that did not work and Gabapentin but he never picked up the medicine.  Had a reaction with Ativan and pain medication. SawDr. Letta Moynahan inpast.  Recent Results (from the past 2160 hour(s))  VITAMIN D 25 Hydroxy (Vit-D Deficiency, Fractures)     Status: None   Collection Time: 05/15/19 11:01 AM  Result Value Ref Range   VITD 40.58 30.00 - 100.00 ng/mL  IBC panel     Status: None   Collection Time: 05/15/19 11:01 AM  Result Value Ref Range   Iron 123  42 - 165 ug/dL   Transferrin 251.0 212.0 - 360.0 mg/dL   Saturation Ratios 35.0 20.0 - 50.0 %  Vitamin B12     Status: None   Collection Time: 05/15/19 11:01 AM  Result Value Ref Range   Vitamin B-12 248 211 - 911 pg/mL  PSA     Status: None   Collection Time: 05/15/19 11:01 AM  Result Value Ref Range   PSA 2.42 0.10 - 4.00 ng/mL    Comment: Test performed using Access Hybritech PSA Assay, a parmagnetic partical, chemiluminecent immunoassay.  CBC with  Differential/Platelet     Status: None   Collection Time: 05/15/19 11:01 AM  Result Value Ref Range   WBC 4.7 4.0 - 10.5 K/uL   RBC 4.42 4.22 - 5.81 Mil/uL   Hemoglobin 14.3 13.0 - 17.0 g/dL   HCT 42.4 39.0 - 52.0 %   MCV 95.9 78.0 - 100.0 fl   MCHC 33.6 30.0 - 36.0 g/dL   RDW 12.7 11.5 - 15.5 %   Platelets 241.0 150.0 - 400.0 K/uL   Neutrophils Relative % 66.0 43.0 - 77.0 %   Lymphocytes Relative 23.5 12.0 - 46.0 %   Monocytes Relative 9.2 3.0 - 12.0 %   Eosinophils Relative 0.1 0.0 - 5.0 %   Basophils Relative 1.2 0.0 - 3.0 %   Neutro Abs 3.1 1.4 - 7.7 K/uL   Lymphs Abs 1.1 0.7 - 4.0 K/uL   Monocytes Absolute 0.4 0.1 - 1.0 K/uL   Eosinophils Absolute 0.0 0.0 - 0.7 K/uL   Basophils Absolute 0.1 0.0 - 0.1 K/uL  TSH     Status: None   Collection Time: 05/15/19 11:01 AM  Result Value Ref Range   TSH 0.77 0.35 - 4.50 uIU/mL  Hepatic function panel     Status: None   Collection Time: 05/15/19 11:01 AM  Result Value Ref Range   Total Bilirubin 0.9 0.2 - 1.2 mg/dL   Bilirubin, Direct 0.2 0.0 - 0.3 mg/dL   Alkaline Phosphatase 61 39 - 117 U/L   AST 23 0 - 37 U/L   ALT 15 0 - 53 U/L   Total Protein 6.7 6.0 - 8.3 g/dL   Albumin 4.5 3.5 - 5.2 g/dL  Basic metabolic panel     Status: Abnormal   Collection Time: 05/15/19 11:01 AM  Result Value Ref Range   Sodium 135 135 - 145 mEq/L   Potassium 4.3 3.5 - 5.1 mEq/L   Chloride 100 96 - 112 mEq/L   CO2 26 19 - 32 mEq/L   Glucose, Bld 101 (H) 70 - 99 mg/dL   BUN 7 6 - 23 mg/dL   Creatinine, Ser 0.92 0.40 - 1.50 mg/dL   Calcium 9.8 8.4 - 10.5 mg/dL   GFR 80.65 >60.00 mL/min  Hemoglobin A1c     Status: None   Collection Time: 05/15/19 11:01 AM  Result Value Ref Range   Hgb A1c MFr Bld 4.8 4.6 - 6.5 %    Comment: Glycemic Control Guidelines for People with Diabetes:Non Diabetic:  <6%Goal of Therapy: <7%Additional Action Suggested:  >8%   Lipid panel     Status: None   Collection Time: 05/15/19 11:01 AM  Result Value Ref Range    Cholesterol 118 0 - 200 mg/dL    Comment: ATP III Classification       Desirable:  < 200 mg/dL               Borderline High:  200 - 239  mg/dL          High:  > = 240 mg/dL   Triglycerides 72.0 0.0 - 149.0 mg/dL    Comment: Normal:  <150 mg/dLBorderline High:  150 - 199 mg/dL   HDL 50.80 >39.00 mg/dL   VLDL 14.4 0.0 - 40.0 mg/dL   LDL Cholesterol 53 0 - 99 mg/dL   Total CHOL/HDL Ratio 2     Comment:                Men          Women1/2 Average Risk     3.4          3.3Average Risk          5.0          4.42X Average Risk          9.6          7.13X Average Risk          15.0          11.0                       NonHDL 67.26     Comment: NOTE:  Non-HDL goal should be 30 mg/dL higher than patient's LDL goal (i.e. LDL goal of < 70 mg/dL, would have non-HDL goal of < 100 mg/dL)  CBC     Status: None   Collection Time: 05/20/19  4:41 PM  Result Value Ref Range   WBC 4.7 4.0 - 10.5 K/uL   RBC 4.32 4.22 - 5.81 MIL/uL   Hemoglobin 14.1 13.0 - 17.0 g/dL   HCT 40.7 39.0 - 52.0 %   MCV 94.2 80.0 - 100.0 fL   MCH 32.6 26.0 - 34.0 pg   MCHC 34.6 30.0 - 36.0 g/dL   RDW 11.7 11.5 - 15.5 %   Platelets 223 150 - 400 K/uL   nRBC 0.0 0.0 - 0.2 %    Comment: Performed at [facility], [place] [address], [place], [place] [id number]  Basic metabolic panel     Status: Abnormal   Collection Time: 05/20/19  4:41 PM  Result Value Ref Range   Sodium 130 (L) 135 - 145 mmol/L   Potassium 3.4 (L) 3.5 - 5.1 mmol/L   Chloride 97 (L) 98 - 111 mmol/L   CO2 25 22 - 32 mmol/L   Glucose, Bld 118 (H) 70 - 99 mg/dL   BUN 5 (L) 8 - 23 mg/dL   Creatinine, Ser 0.89 0.61 - 1.24 mg/dL   Calcium 9.1 8.9 - 10.3 mg/dL   GFR calc non Af Amer >60 >60 mL/min   GFR calc Af Amer >60 >60 mL/min   Anion gap 8 5 - 15    Comment: Performed at [facility] [address], [place], [place] [address]  Ethanol     Status: None   Collection Time: 05/20/19  4:41 PM  Result Value Ref Range   Alcohol, Ethyl (B) <10 <10 mg/dL     Comment: (NOTE) Lowest detectable limit for serum alcohol is 10 mg/dL. For medical purposes only. Performed at [facility], [place] [address] [place], [place] [address]   Rapid urine drug screen (hospital performed)     Status: None   Collection Time: 05/20/19  5:10 PM  Result Value Ref Range   Opiates NONE DETECTED NONE DETECTED   Cocaine NONE DETECTED NONE DETECTED   Benzodiazepines NONE DETECTED  NONE DETECTED   Amphetamines NONE DETECTED NONE DETECTED   Tetrahydrocannabinol NONE DETECTED NONE DETECTED   Barbiturates NONE DETECTED NONE DETECTED    Comment: (NOTE) DRUG SCREEN FOR MEDICAL PURPOSES ONLY.  IF CONFIRMATION IS NEEDED FOR ANY PURPOSE, NOTIFY LAB WITHIN 5 DAYS. LOWEST DETECTABLE LIMITS FOR URINE DRUG SCREEN Drug Class                     Cutoff (ng/mL) Amphetamine and metabolites    1000 Barbiturate and metabolites    200 Benzodiazepine                 [id number] Tricyclics and metabolites     300 Opiates and metabolites        300 Cocaine and metabolites        300 THC                            50 Performed at [facility], [place] [address], [place], [place] [address]   Urinalysis, Routine w reflex microscopic     Status: Abnormal   Collection Time: 05/20/19  5:10 PM  Result Value Ref Range   Color, Urine STRAW (A) YELLOW   APPearance CLEAR CLEAR   Specific Gravity, Urine 1.002 (L) 1.005 - 1.030   pH 7.0 5.0 - 8.0   Glucose, UA NEGATIVE NEGATIVE mg/dL   Hgb urine dipstick NEGATIVE NEGATIVE   Bilirubin Urine NEGATIVE NEGATIVE   Ketones, ur NEGATIVE NEGATIVE mg/dL   Protein, ur NEGATIVE NEGATIVE mg/dL   Nitrite NEGATIVE NEGATIVE   Leukocytes,Ua NEGATIVE NEGATIVE    Comment: Performed at [facility] [address] [place], [place] [address]  SARS Coronavirus 2 [facility] order, Performed in [facility] hospital lab) Nasopharyngeal Nasopharyngeal Swab     Status: None   Collection Time: 05/20/19 10:56 PM   Specimen: Nasopharyngeal Swab  Result  Value Ref Range   SARS Coronavirus 2 NEGATIVE NEGATIVE    Comment: (NOTE) If result is NEGATIVE SARS-CoV-2 target nucleic acids are NOT DETECTED. The SARS-CoV-2 RNA is generally detectable in upper and lower  respiratory specimens during the acute phase of infection. The lowest  concentration of SARS-CoV-2 viral copies this assay can detect is 250  copies / mL. A negative result does not preclude SARS-CoV-2 infection  and should not be used as the sole basis for treatment or other  patient management decisions.  A negative result may occur with  improper specimen collection / handling, submission of specimen other  than nasopharyngeal swab, presence of viral mutation(s) within the  areas targeted by this assay, and inadequate number of viral copies  (<250 copies / mL). A negative result must be combined with clinical  observations, patient history, and epidemiological information. If result is POSITIVE SARS-CoV-2 target nucleic acids are DETECTED. The SARS-CoV-2 RNA is generally detectable in upper and lower  respiratory specimens dur ing the acute phase of infection.  Positive  results are indicative of active infection with SARS-CoV-2.  Clinical  correlation with patient history and other diagnostic information is  necessary to determine patient infection status.  Positive results do  not rule out bacterial infection or co-infection with other viruses. If result is PRESUMPTIVE POSTIVE SARS-CoV-2 nucleic acids MAY BE PRESENT.   A presumptive positive result was obtained on the submitted specimen  and confirmed on repeat testing.  While 2019 novel coronavirus  (SARS-CoV-2) nucleic acids may be present in the  submitted sample  additional confirmatory testing may be necessary for epidemiological  and / or clinical management purposes  to differentiate between  SARS-CoV-2 and other Sarbecovirus currently known to infect humans.  If clinically indicated additional testing with an  alternate test  methodology [id number]) is advised. The SARS-CoV-2 RNA is generally  detectable in upper and lower respiratory sp ecimens during the acute  phase of infection. The expected result is Negative. Fact Sheet for Patients:  StrictlyIdeas.no Fact Sheet for Healthcare Providers: BankingDealers.co.za This test is not yet approved or cleared by the [place] FDA and has been authorized for detection and/or diagnosis of SARS-CoV-2 by FDA under an Emergency Use Authorization (EUA).  This EUA will remain in effect (meaning this test can be used) for the duration of the COVID-19 declaration under Section 564(b)(1) of the Act, 21 U.S.C. section 360bbb-3(b)(1), unless the authorization is terminated or revoked sooner. Performed at [facility], [place] [address], [place], [place] [address]      Psychiatric Specialty Exam: Physical Exam  ROS  There were no vitals taken for this visit.There is no height or weight on file to calculate BMI.  General Appearance: NA  Eye Contact:  NA  Speech:  Normal Rate and Slow  Volume:  Normal  Mood:  Dysphoric and Irritable  Affect:  NA  Thought Process:  Descriptions of Associations: Intact  Orientation:  Full (Time, Place, and Person)  Thought Content:  Paranoid Ideation and Rumination  Suicidal Thoughts:  No  Homicidal Thoughts:  No  Memory:  Immediate;   Fair Recent;   Fair Remote;   Fair  Judgement:  Fair  Insight:  Fair  Psychomotor Activity:  NA  Concentration:  Concentration: Fair and Attention Span: Fair  Recall:  Good  Fund of Knowledge:  Good  Language:  Good  Akathisia:  No  Handed:  Right  AIMS (if indicated):     Assets:  Communication Skills Desire for Improvement Housing Resilience  ADL's:  Intact  Cognition:  WNL  Sleep:   3 to 5 hours.      Assessment and Plan: Bipolar disorder type I.  Posttraumatic stress disorder.  Generalized anxiety disorder.   Insomnia.  I review his records and recent blood work results.  Patient overdosed on Ambien which was prescribed by his primary care physician on September 23.  He was seen in emergency room recently but did not meet criteria for inpatient.   I reviewed blood work.  His hemoglobin A1c is normal.  Recommend that he should stop the Ambien.  Patient has another history of overdose on Ambien and I will put Ambien in allergy section.  I do believe patient is still have manic and depression.  He is still have mood symptoms which are causing insomnia.  He does not feel Xanax keep him anxious all day.  I recommend to try clonazepam 0.5 mg twice a day to help his anxiety, discontinue Sinequan since patient is not taking it anymore due to lack of response.  I will try higher dose of Abilify 10 mg to help his irritability, mania and paranoia.  Continue present Lamictal 200 mg daily.  He has no tremors, shakes or any rash.  Encouraged to continue therapy with Janett Billow for coping skills.  I do believe he should get a sleep study since patient has tried multiple medication for insomnia but lack of response.  I will forward my note to his primary care physician so he can get sleep study  referral.  Discussed safety concerns at any time having active suicidal thoughts or homicidal thought that he need to call 911 of the local emergency room.  Discussed medication side effects and benefits specially EPS, tremors and rash with the psychotropic medication.  Follow-up in 4 weeks.  Time spent 30 minutes.  More than 50% of time spent in psychoeducation, long-term prognosis, reviewing his records including recent blood work results.  Follow Up Instructions:    I discussed the assessment and treatment plan with the patient. The patient was provided an opportunity to ask questions and all were answered. The patient agreed with the plan and demonstrated an understanding of the instructions.   The patient was advised to call back or  seek an in-person evaluation if the symptoms worsen or if the condition fails to improve as anticipated.  I provided 30 minutes of non-face-to-face time during this encounter.   Kathlee Nations, MD

## 2019-05-30 ENCOUNTER — Other Ambulatory Visit: Payer: Self-pay | Admitting: Physician Assistant

## 2019-05-30 NOTE — Telephone Encounter (Signed)
He was going to have surgery and hold Plavix for his surgery.  After his surgery, he was supposed to stop ASA and restart Plavix. So, yes, please refill Plavix. Richardson Dopp, PA-C    05/30/2019 4:46 PM

## 2019-05-30 NOTE — Telephone Encounter (Signed)
Pt's pharmacy is requesting a refill on Plavix. This medication was on hold. Would Richardson Dopp, PA like to restart this medication? Please address

## 2019-06-04 ENCOUNTER — Encounter: Payer: Self-pay | Admitting: Physician Assistant

## 2019-06-04 ENCOUNTER — Other Ambulatory Visit: Payer: Self-pay

## 2019-06-04 ENCOUNTER — Ambulatory Visit (INDEPENDENT_AMBULATORY_CARE_PROVIDER_SITE_OTHER): Payer: Medicare Other | Admitting: Physician Assistant

## 2019-06-04 VITALS — BP 130/76 | HR 79 | Ht 71.0 in | Wt 167.0 lb

## 2019-06-04 DIAGNOSIS — E785 Hyperlipidemia, unspecified: Secondary | ICD-10-CM | POA: Diagnosis not present

## 2019-06-04 DIAGNOSIS — I10 Essential (primary) hypertension: Secondary | ICD-10-CM

## 2019-06-04 DIAGNOSIS — I25119 Atherosclerotic heart disease of native coronary artery with unspecified angina pectoris: Secondary | ICD-10-CM | POA: Diagnosis not present

## 2019-06-04 MED ORDER — NITROGLYCERIN 0.4 MG SL SUBL: 0.4000 mg | SUBLINGUAL_TABLET | SUBLINGUAL | 3 refills | Status: DC | PRN

## 2019-06-04 MED ORDER — DILTIAZEM HCL ER COATED BEADS 180 MG PO CP24: 180.0000 mg | ORAL_CAPSULE | Freq: Every day | ORAL | 11 refills | Status: DC

## 2019-06-04 NOTE — Patient Instructions (Signed)
Medication Instructions:  Your physician recommends that you continue on your current medications as directed. Please refer to the Current Medication list given to you today.  If you need a refill on your cardiac medications before your next appointment, please call your pharmacy.   Lab work: None   If you have labs (blood work) drawn today and your tests are completely normal, you will receive your results only by: Marland Kitchen MyChart Message (if you have MyChart) OR . A paper copy in the mail If you have any lab test that is abnormal or we need to change your treatment, we will call you to review the results.  Testing/Procedures: None   Follow-Up: At [facility], you and your health needs are our priority.  As part of our continuing mission to provide you with exceptional heart care, we have created designated Provider Care Teams.  These Care Teams include your primary Cardiologist (physician) and Advanced Practice Providers (APPs -  Physician Assistants and Nurse Practitioners) who all work together to provide you with the care you need, when you need it. You will need a follow up appointment in:  1 years with Richardson Dopp PA-C.  Please call our office 2 months in advance to schedule this appointment.   Any Other Special Instructions Will Be Listed Below (If Applicable).

## 2019-06-04 NOTE — Progress Notes (Signed)
Cardiology Office Note:    Date:  06/04/2019   ID:  JAKALEB PAYER, DOB 1946-05-22, MRN [id number]  PCP:  Biagio Borg, MD  Cardiologist:  Sherren Mocha, MD / Richardson Dopp, PA-C  Electrophysiologist:  None   Referring MD: Biagio Borg, MD   Chief Complaint  Patient presents with  . Follow-up    CAD    History of Present Illness:    Justin [name] is a 73 y.o. male with:   Coronary artery disease with stable angina since PCI  Unstable angina >> s/p DES to the LAD and DX in 2011  NSTEMI 03/2011 >> no culprit on cath; medical therapy  Stress echo 2017: Normal  Myoview 3/19: no ischemia  Echocardiogram 3/19: EF 60-65, Gr 1 DD  Hypertension  Hyperlipidemia  Anxiety/depression  Mr. Freiman was last seen in September 2019.  He returns for follow-up.  He is here alone.  Since last seen, he has not had chest discomfort, significant shortness of breath, syncope.  He has not had orthopnea, lower extremity swelling.  He has remained active and has overall felt well.  He did go to the emergency room recently with side effects related to Ambien.  Prior CV studies:   The following studies were reviewed today:  Echo 11/17/17 Mild conc LVH, EF 60-65, no RWMA, Gr 1 DD, mildly calcified AV leaflets, mild AI, mildly dilated aortic root (40 mm), MAC, trivial MR, mild LAE, mild RV dilation, atrial septal lipomatous hypertrophy  Nuclear stress test 11/17/17 EF 57, inf/inf-sept/inf-lat defect c/w probable soft tissue atten, cannot exclude subendocardial scar, no ischemia; Intermediate Risk  Past Medical History:  Diagnosis Date  . Anxiety and depression   . Arthritis    knees, c-spine // gets lumbar ESI q 6 mos  . Bipolar disorder ([place])   . CAD (coronary artery disease)    [place] 08/2009 >> LHC ([facility]) - mLAD 70, Dx 65 >> PCI:  3x15 mm Endeavor DES to mLAD and 2.5x12 mm Endeavor DES to Dx // S/p NSTEMI >> LHC 8/12 ([facility]) - LM ok, LAD prox and mid 40; LAD stent ok, Dx stent  ok, mRCA 30, mLCx 50 >> med Rx // ETT-Echo 2/17 ([facility]): Normal, EF 55-60 at rest  . Chicken pox   . 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 Grief   . History of echocardiogram    Echo 3/19: Mild concentric LVH, EF 60-65, normal wall motion, grade 1 diastolic dysfunction, mild AI, mildly dilated aortic root (40 mm), MAC, mild LAE, atrial septal lipomatous hypertrophy  . History of non-ST elevation myocardial infarction (NSTEMI)   . History of nuclear stress test    Nuclear stress test 3/19: EF 57, inferior/inferoseptal/inferolateral defect consistent with probable soft tissue attenuation (cannot exclude subendocardial scar), no ischemia, intermediate risk >> Echo 3/19 normal EF, normal wall motion  . History of pneumonia 2011  . History of prostatitis 1990s  . Hx of adenomatous colonic polyps 06/22/2016  . Hyperlipidemia   . Hypertension   . Migraines    prior history  . PTSD (post-traumatic stress disorder)    Surgical Hx: The patient  has a past surgical history that includes Nasal septum surgery; Elbow surgery (Left); Inguinal hernia repair (Bilateral); Tonsillectomy; Knee arthroscopy (Right); Knee cartilage surgery (Left); Elbow surgery (Right); Cervical discectomy; STENT PLACEMENT VASCULAR ([place] [facility]) (09/02/2010); Colonoscopy (03/2017); and Total knee arthroplasty (Left, 08/31/2018).   Current Medications: Current Meds  Medication Sig  . ARIPiprazole (ABILIFY) 10 MG  tablet Take 1 tablet (10 mg total) by mouth daily.  . clonazePAM (KLONOPIN) 0.5 MG tablet Take 1 tablet (0.5 mg total) by mouth 2 (two) times daily.  . clopidogrel (PLAVIX) 75 MG tablet TAKE 1 TABLET BY MOUTH DAILY  . diltiazem (CARTIA XT) 180 MG 24 hr capsule Take 1 capsule (180 mg total) by mouth daily.  Marland Kitchen docusate sodium (COLACE) 100 MG capsule Take 1 capsule (100 mg total) by mouth 2 (two) times daily.  Marland Kitchen lamoTRIgine (LAMICTAL) 200 MG tablet Take 1 tablet (200 mg total) by mouth daily. For mood control  . nitroGLYCERIN  (NITROSTAT) 0.4 MG SL tablet Place 1 tablet (0.4 mg total) under the tongue every 5 (five) minutes as needed for chest pain.  . rosuvastatin (CRESTOR) 20 MG tablet Take 1 tablet by mouth once daily  . [DISCONTINUED] diltiazem (CARTIA XT) 180 MG 24 hr capsule Take 1 capsule (180 mg total) by mouth daily.  . [DISCONTINUED] nitroGLYCERIN (NITROSTAT) 0.4 MG SL tablet Place 1 tablet (0.4 mg total) under the tongue every 5 (five) minutes as needed for chest pain.     Allergies:   Ambien [zolpidem tartrate] and Seroquel [quetiapine]   Social History   Tobacco Use  . Smoking status: Never Smoker  . Smokeless tobacco: Never Used  Substance Use Topics  . Alcohol use: No    Alcohol/week: 0.0 standard drinks  . Drug use: No     Family Hx: The patient's family history includes Alzheimer's disease in his mother; Arthritis in his father and mother; Depression in his brother; Diabetes in his sister and sister; Heart attack in his father, mother, and sister; Heart disease in his father and mother; Hyperlipidemia in his father; Hypertension in his father and mother; Lung cancer in his maternal aunt, paternal aunt, and paternal grandmother; Multiple sclerosis in his sister; Pulmonary embolism in his sister; Stroke in his father.  ROS:   Please see the history of present illness.    ROS All other systems reviewed and are negative.   EKGs/Labs/Other Test Reviewed:    EKG:  EKG is not ordered today.  The ekg done in the ED on 05/20/2019 demonstrates normal sinus rhythm, heart rate 99, left axis deviation, no acute changes, QTC 453  Recent Labs: 05/15/2019: ALT 15; TSH 0.77 05/20/2019: BUN 5; Creatinine, Ser 0.89; Hemoglobin 14.1; Platelets 223; Potassium 3.4; Sodium 130   Recent Lipid Panel Lab Results  Component Value Date/Time   CHOL 118 05/15/2019 11:01 AM   TRIG 72.0 05/15/2019 11:01 AM   HDL 50.80 05/15/2019 11:01 AM   CHOLHDL 2 05/15/2019 11:01 AM   LDLCALC 53 05/15/2019 11:01 AM     Physical Exam:    VS:  BP 130/76   Pulse 79   Ht _0  (1.803 m)   Wt 167 lb (75.8 kg)   SpO2 98%   BMI 23.29 kg/m     Wt Readings from Last 3 Encounters:  06/04/19 167 lb (75.8 kg)  05/20/19 170 lb (77.1 kg)  05/15/19 170 lb (77.1 kg)     Physical Exam  Constitutional: He is oriented to person, place, and time. He appears well-developed and well-nourished. No distress.  HENT:  Head: Normocephalic and atraumatic.  Neck: Neck supple. No JVD present. Carotid bruit is not present.  Cardiovascular: Normal rate, regular rhythm, S1 normal and S2 normal.  No murmur heard. Pulmonary/Chest: Breath sounds normal. He has no rales.  Abdominal: Soft. There is no hepatomegaly.  Musculoskeletal:  General: No edema.  Neurological: He is alert and oriented to person, place, and time.  Skin: Skin is warm and dry.    ASSESSMENT & PLAN:    1. Coronary artery disease involving native coronary artery of native heart with angina pectoris ([place]) History of drug-eluting stent to the LAD and diagonal 2011.  He suffered a non-ST elevation myocardial infarction in 2012 without culprit lesion and he has been managed medically.  Stress test in 2019 was negative for ischemia.  He is doing well without anginal symptoms.  Continue clopidogrel, diltiazem, rosuvastatin.  2. Essential hypertension The patient's blood pressure is controlled on his current regimen.  Continue current therapy.   3. Hyperlipidemia, unspecified hyperlipidemia type LDL optimal on most recent lab work.  Continue current Rx.     Dispo:  Return in about 1 year (around 06/03/2020) for Routine Follow Up, w/ Richardson Dopp, PA-C, (virtual or in-person).   Medication Adjustments/Labs and Tests Ordered: Current medicines are reviewed at length with the patient today.  Concerns regarding medicines are outlined above.  Tests Ordered: No orders of the defined types were placed in this encounter.  Medication Changes: Meds ordered  this encounter  Medications  . diltiazem (CARTIA XT) 180 MG 24 hr capsule    Sig: Take 1 capsule (180 mg total) by mouth daily.    Dispense:  30 capsule    Refill:  11  . nitroGLYCERIN (NITROSTAT) 0.4 MG SL tablet    Sig: Place 1 tablet (0.4 mg total) under the tongue every 5 (five) minutes as needed for chest pain.    Dispense:  25 tablet    Refill:  3    Signed, Richardson Dopp, PA-C  06/04/2019 2:55 PM    [address], [place], [place]  [address] Phone: [phone]; Fax: [phone]

## 2019-06-07 ENCOUNTER — Encounter: Payer: Self-pay | Admitting: Internal Medicine

## 2019-06-11 ENCOUNTER — Encounter (HOSPITAL_COMMUNITY): Payer: Self-pay | Admitting: Licensed Clinical Social Worker

## 2019-06-11 ENCOUNTER — Other Ambulatory Visit: Payer: Self-pay

## 2019-06-11 ENCOUNTER — Ambulatory Visit (INDEPENDENT_AMBULATORY_CARE_PROVIDER_SITE_OTHER): Payer: Medicare Other | Admitting: Licensed Clinical Social Worker

## 2019-06-11 DIAGNOSIS — F3162 Bipolar disorder, current episode mixed, moderate: Secondary | ICD-10-CM | POA: Diagnosis not present

## 2019-06-11 NOTE — Progress Notes (Signed)
Virtual Visit via Telephone Note  I connected with Justin [name] on 06/11/19 at 11:00 AM EDT by telephone and verified that I am speaking with the correct person using two identifiers.     I discussed the limitations, risks, security and privacy concerns of performing an evaluation and management service by telephone and the availability of in person appointments. I also discussed with the patient that there may be a patient responsible charge related to this service. The patient expressed understanding and agreed to proceed.   Type of Therapy: Individual Therapy  Treatment Goals addressed: improve psychiatric symptoms, Controlled Behavior, Improve Unhelpful Thought Patterns, Emotional Regulation Skills (Moderate moods, anger management, stress management)  Interventions: CBT, Motivational Interviewing, grounding techniques and mindfulness techniques, and psychoeducation  Summary: Justin [name] is a 73 y.o. male who presents with Bipolar 1 Disorder, Mixed, Moderate  Suicidal/Homicidal: No - without intent/plan  Therapist Response:  Justin [name] met with clinician for an individual session. [place] discussed his psychiatric symptoms, his current life events and his homework.He reports he has been doing a little better. He reports ongoing problems with his sleep. He reports he goes to bed at about 10:30-11pm and wakes around 3-4 am. He reported this morning waking at 3am and still being awake at the time of the appointment (11am). Clinician explored feedback from Dr. Adele Schilder, noted that the Klonopin helps him get sleepy, but does not keep him asleep. Clinician explored how many hours Justin [name] needs to feel rested and also noted that as we age, we don't need as much sleep. Justin [name] reports he would like to get at least 6-7 hours per night, but usually only gets 4-5. Clinician processed thought patterns about the sleep and explored options for deciding he only needs 4-5 hours of sleep to see if that  changes anything. Clinician has reviewed sleep hygiene tasks. Clinician will also follow up with Dr. Adele Schilder about a sleep study.  Clinician discussed getting a dog, as he did much better emotionally when he had a support system in the home. Justin [name] reports he is getting closer to actually thinking about what kind of dog he wants to get. He agreed that he feels more ready to take on that responsibility again.   Plan: Return again in 1 -2 weeks.  Diagnosis: Axis I: Bipolar 1 Disorder, Mixed, Moderate    I discussed the assessment and treatment plan with the patient. The patient was provided an opportunity to ask questions and all were answered. The patient agreed with the plan and demonstrated an understanding of the instructions.   The patient was advised to call back or seek an in-person evaluation if the symptoms worsen or if the condition fails to improve as anticipated.  I provided 45 minutes of non-face-to-face time during this encounter.   [name], LCSW

## 2019-06-17 ENCOUNTER — Encounter: Payer: Self-pay | Admitting: Internal Medicine

## 2019-06-26 ENCOUNTER — Ambulatory Visit (INDEPENDENT_AMBULATORY_CARE_PROVIDER_SITE_OTHER): Payer: Medicare Other | Admitting: Psychiatry

## 2019-06-26 ENCOUNTER — Encounter (HOSPITAL_COMMUNITY): Payer: Self-pay | Admitting: Psychiatry

## 2019-06-26 ENCOUNTER — Other Ambulatory Visit: Payer: Self-pay

## 2019-06-26 DIAGNOSIS — F3162 Bipolar disorder, current episode mixed, moderate: Secondary | ICD-10-CM | POA: Diagnosis not present

## 2019-06-26 DIAGNOSIS — F411 Generalized anxiety disorder: Secondary | ICD-10-CM

## 2019-06-26 DIAGNOSIS — F4310 Post-traumatic stress disorder, unspecified: Secondary | ICD-10-CM | POA: Diagnosis not present

## 2019-06-26 MED ORDER — LAMOTRIGINE 200 MG PO TABS: 200.0000 mg | ORAL_TABLET | Freq: Every day | ORAL | 1 refills | Status: DC

## 2019-06-26 MED ORDER — BENZTROPINE MESYLATE 0.5 MG PO TABS: 0.5000 mg | ORAL_TABLET | Freq: Every day | ORAL | 1 refills | Status: DC

## 2019-06-26 MED ORDER — ARIPIPRAZOLE 15 MG PO TABS: 15.0000 mg | ORAL_TABLET | Freq: Every day | ORAL | 1 refills | Status: DC

## 2019-06-26 MED ORDER — CLONAZEPAM 0.5 MG PO TABS: 0.5000 mg | ORAL_TABLET | Freq: Two times a day (BID) | ORAL | 1 refills | Status: DC

## 2019-06-26 NOTE — Progress Notes (Signed)
Virtual Visit via Telephone Note  I connected with [name] on 06/26/19 at 11:00 AM EST by telephone and verified that I am speaking with the correct person using two identifiers.   I discussed the limitations, risks, security and privacy concerns of performing an evaluation and management service by telephone and the availability of in person appointments. I also discussed with the patient that there may be a patient responsible charge related to this service. The patient expressed understanding and agreed to proceed.   History of Present Illness: Patient was evaluated by phone session.  On his last visit we have increased his Abilify and change from Xanax to Klonopin.  He sees some improvement in his anxiety and he is not irritable but he endorsed fair arm movements when he gets frustrated.  Last week he lost his power cable due to strom.  That make him very upset.  He is also frustrated on his son and daughter-in-law.  He is getting therapy from [facility].  He is tolerating higher dose of Abilify and he reported no tremors, shakes or any EPS.  He denies any suicidal thoughts but is still there are times he feels sad, disappointed from his son and hopeless.  His hallucinations are also less intense.  He has paranoia but he feel increase Abilify help him.  He is taking Lamictal and denies any rash or any itching.  His energy level is fair.  Denies drinking or using any illegal substances.  Past Psychiatric History:Reviewed. H/O suicidal attemptby taking overdose on Ambien, hydroxyzine and alcohol. H/Obipolar disorder with multipleinpatient. DidIOP. [facility] Tried Zyprexa, Cymbalta, Seroquel, Vistaril, Ambien, Neurontin, Wellbutrin and triptal. Tried low-dose doxepin. We tried trazodone that did not work andGabapentin but he never picked up the medicine. Had a reaction with Ativan and pain medication. SawDr. Letta Moynahan inpast.    Psychiatric Specialty  Exam: Physical Exam  ROS  There were no vitals taken for this visit.There is no height or weight on file to calculate BMI.  General Appearance: NA  Eye Contact:  NA  Speech:  Normal Rate  Volume:  Normal  Mood:  Dysphoric and Irritable  Affect:  NA  Thought Process:  Descriptions of Associations: Intact  Orientation:  Full (Time, Place, and Person)  Thought Content:  Paranoid Ideation and Rumination  Suicidal Thoughts:  No  Homicidal Thoughts:  No  Memory:  Immediate;   Fair Recent;   Fair Remote;   Good  Judgement:  Fair  Insight:  Fair  Psychomotor Activity:  NA  Concentration:  Concentration: Fair and Attention Span: Fair  Recall:  Good  Fund of Knowledge:  Good  Language:  Good  Akathisia:  No  Handed:  Right  AIMS (if indicated):     Assets:  Communication Skills Desire for Improvement Housing Resilience  ADL's:  Intact  Cognition:  WNL  Sleep:         Assessment and Plan: Bipolar disorder type I.  Posttraumatic stress disorder.  Generalized anxiety disorder.  Patient doing better since we switch from Xanax to Klonopin.  He is also tolerating higher dose of Abilify and reported no side effects.  Encouraged to continue therapy with Janett Billow.  Recommend to try Abilify 15 mg to help his residual paranoia and irritability.  He agreed with the plan.  I will also add low-dose Cogentin to help the side effects of Abilify.  He is no longer taking Ambien which he overdosed last month and prescribed by PCP.  Continue Lamictal 200 mg daily, Klonopin 0.5 mg twice a day, increase Abilify 15 mg at bedtime and add Cogentin 0.5 mg at bedtime.  Discussed medication side effects and benefits.  Discussed safety concern that anytime having active suicidal thoughts or homicidal thoughts then he need to call 911 or go to local emergency room.  Time spent 30 minutes.  More than 50% of the time spent in psychoeducation, discussing long-term prognosis.  Follow Up Instructions:    I  discussed the assessment and treatment plan with the patient. The patient was provided an opportunity to ask questions and all were answered. The patient agreed with the plan and demonstrated an understanding of the instructions.   The patient was advised to call back or seek an in-person evaluation if the symptoms worsen or if the condition fails to improve as anticipated.  I provided 30 minutes of non-face-to-face time during this encounter.   Kathlee Nations, MD

## 2019-06-27 ENCOUNTER — Encounter (HOSPITAL_COMMUNITY): Payer: Self-pay | Admitting: Licensed Clinical Social Worker

## 2019-06-27 ENCOUNTER — Other Ambulatory Visit: Payer: Self-pay

## 2019-06-27 ENCOUNTER — Ambulatory Visit (INDEPENDENT_AMBULATORY_CARE_PROVIDER_SITE_OTHER): Payer: Medicare Other | Admitting: Licensed Clinical Social Worker

## 2019-06-27 DIAGNOSIS — F3162 Bipolar disorder, current episode mixed, moderate: Secondary | ICD-10-CM

## 2019-06-27 NOTE — Progress Notes (Signed)
Virtual Visit via Telephone Note  I connected with Justin [name] on 06/27/19 at 12:30 PM EST by telephone and verified that I am speaking with the correct person using two identifiers.     I discussed the limitations, risks, security and privacy concerns of performing an evaluation and management service by telephone and the availability of in person appointments. I also discussed with the patient that there may be a patient responsible charge related to this service. The patient expressed understanding and agreed to proceed.  Type of Therapy: Individual Therapy  Treatment Goals addressed: improve psychiatric symptoms, Controlled Behavior, Improve Unhelpful Thought Patterns, Emotional Regulation Skills (Moderate moods, anger management, stress management)  Interventions: CBT, Motivational Interviewing, grounding techniques and mindfulness techniques, and psychoeducation  Summary: Justin [name] is a 73 y.o. male who presents with Bipolar 1 Disorder, Mixed, Moderate  Suicidal/Homicidal: No - without intent/plan  Therapist Response:  Justin [name] met with clinician for an individual session. Justin [name] discussed his psychiatric symptoms, his current life events and his homework.He reports he has been doing a little better.He reports that his sleep continues to be problematic, but with new medication from Dr. Adele Schilder, Justin [name] reports feeling hopeful that this will improve. Justin [name] reported that he did make an appointment to go to the animal shelter. He reported that he met a dog that he fell in love with, but it was already set to be adopted by another family. Justin [name] reported that he felt heartbroken, but not completely defeated. Clinician praised and  encouraged Justin [name] for trying and encouraged him to try again. Clinician noted that he has a lot of love to give and that one day the right dog would come along. Justin [name] processed ongoing issues with his son. Clinician utilized [place] OARS and provided feedback about the  lack of boundaries between [name] and his son. Clinician encouraged Justin [name] to be open about his feelings when his son is hurtful to him. Justin [name] reported fear of retribution. Clinician stated that when he stops being worried about how his son will react and he cannot take it anymore, he will say something and it will change the interactions.    Plan: Return again in 1 -2 weeks.  Diagnosis: Axis I: Bipolar 1 Disorder, Mixed, Moderate      I discussed the assessment and treatment plan with the patient. The patient was provided an opportunity to ask questions and all were answered. The patient agreed with the plan and demonstrated an understanding of the instructions.   The patient was advised to call back or seek an in-person evaluation if the symptoms worsen or if the condition fails to improve as anticipated.  I provided 50 minutes of non-face-to-face time during this encounter.   [name], LCSW

## 2019-07-03 ENCOUNTER — Telehealth: Payer: Self-pay

## 2019-07-03 ENCOUNTER — Ambulatory Visit (INDEPENDENT_AMBULATORY_CARE_PROVIDER_SITE_OTHER): Payer: Medicare Other | Admitting: Nurse Practitioner

## 2019-07-03 ENCOUNTER — Other Ambulatory Visit: Payer: Self-pay

## 2019-07-03 ENCOUNTER — Encounter: Payer: Self-pay | Admitting: Nurse Practitioner

## 2019-07-03 VITALS — BP 130/70 | HR 58 | Temp 97.8°F | Ht 70.75 in | Wt 171.0 lb

## 2019-07-03 DIAGNOSIS — Z1159 Encounter for screening for other viral diseases: Secondary | ICD-10-CM | POA: Diagnosis not present

## 2019-07-03 DIAGNOSIS — Z7901 Long term (current) use of anticoagulants: Secondary | ICD-10-CM

## 2019-07-03 DIAGNOSIS — Z86010 Personal history of colonic polyps: Secondary | ICD-10-CM | POA: Diagnosis not present

## 2019-07-03 NOTE — Patient Instructions (Addendum)
If you are age 73 or older, your body mass index should be between 23-30. Your Body mass index is 24.02 kg/m. If this is out of the aforementioned range listed, please consider follow up with your Primary Care Provider.  If you are age 12 or younger, your body mass index should be between 19-25. Your Body mass index is 24.02 kg/m. If this is out of the aformentioned range listed, please consider follow up with your Primary Care Provider.   You have been scheduled for a colonoscopy. Please follow written instructions given to you at your visit today.  Please pick up your prep supplies at the pharmacy within the next 1-3 days. If you use inhalers (even only as needed), please bring them with you on the day of your procedure. Your physician has requested that you go to www.startemmi.com and enter the access code given to you at your visit today. This web site gives a general overview about your procedure. However, you should still follow specific instructions given to you by our office regarding your preparation for the procedure.  You will be contacted by our office prior to your procedure for directions on holding your Plavix.  If you do not hear from our office 1 week prior to your scheduled procedure, please call [phone] to discuss.   Thank you for choosing me and [facility].   Tye Savoy, NP

## 2019-07-03 NOTE — Progress Notes (Signed)
ASSESSMENT / PLAN:   27.  73 year old male with history of adenomatous colon polyps.  Three adenomas  removed at time of last colonoscopy 3 years ago.  Due for surveillance colonoscopy.  He has no GI complaints, no general medical complaints.  He feels very well and interested in proceeding with colonoscopy.  -The risks and benefits of colonoscopy with possible polypectomy / biopsies were discussed and the patient agrees to proceed.   2. CAD / Hx of PCI. On chronic Plavix.  Hold Plavix for 5 days before procedure - will instruct when and how to resume after procedure. Patient understands that there is a low but real risk of cardiovascular event such as heart attack, stroke, or embolism /  thrombosis, or ischemia while off Plavix. The patient consents to proceed. Will communicate by phone or EMR with patient's prescribing provider to confirm that holding Plavix is reasonable in this case.     HPI:    Chief Complaint:   Here to discuss polyp surveillance colonoscopy  Justin [name] is a 73 year old male with a PMH significant for bipolar disorder, depression , CAD / NSTEMI /  PCI / , hypertension, hyperlipidemia , colon polyps.  Patient received his 3-year colonoscopy recall letter.  He has no GI complaints other than mild constipation for which he takes a [employer] brand laxative a couple of times a week. No abdominal pain, no blood in stool.  He feels very well without any cardiac symptoms other than occasional SOB with exertion.  He had an knee replacement in January.  Normal CBC , renal function and liver tests on 05/20/2019  Data Reviewed:  Colonoscopy 06/15/16 Three 3 to 8 mm polyps in the descending colon, in the transverse colon and in the ascending colon, removed with a cold snare. Resected and retrieved. - One 10 mm polyp in the sigmoid colon, removed with a hot snare. Resected and retrieved. - Severe diverticulosis in the sigmoid colon. There was narrowing of the colon in  association with the diverticular opening. - The examination was otherwise normal on direct and retroflexion views.  Path - 3 adenomas  Past Medical History:  Diagnosis Date  . Anxiety and depression   . Arthritis    knees, c-spine // gets lumbar ESI q 6 mos  . Bipolar disorder ([place])   . CAD (coronary artery disease)    [place] 08/2009 >> LHC ([facility]) - mLAD 70, Dx 65 >> PCI:  3x15 mm Endeavor DES to mLAD and 2.5x12 mm Endeavor DES to Dx // S/p NSTEMI >> LHC 8/12 ([facility]) - LM ok, LAD prox and mid 40; LAD stent ok, Dx stent ok, mRCA 30, mLCx 50 >> med Rx // ETT-Echo 2/17 ([facility]): Normal, EF 55-60 at rest  . Chicken pox   . 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 Grief   . History of echocardiogram    Echo 3/19: Mild concentric LVH, EF 60-65, normal wall motion, grade 1 diastolic dysfunction, mild AI, mildly dilated aortic root (40 mm), MAC, mild LAE, atrial septal lipomatous hypertrophy  . History of non-ST elevation myocardial infarction (NSTEMI)   . History of nuclear stress test    Nuclear stress test 3/19: EF 57, inferior/inferoseptal/inferolateral defect consistent with probable soft tissue attenuation (cannot exclude subendocardial scar), no ischemia, intermediate risk >> Echo 3/19 normal EF, normal wall motion  . History of pneumonia 2011  . History of prostatitis 1990s  .  Hx of adenomatous colonic polyps 06/22/2016  . Hyperlipidemia   . Hypertension   . Migraines    prior history  . PTSD (post-traumatic stress disorder)      Past Surgical History:  Procedure Laterality Date  . CERVICAL DISCECTOMY    . COLONOSCOPY  03/2017  . ELBOW SURGERY Left   . ELBOW SURGERY Right   . INGUINAL HERNIA REPAIR Bilateral    1996, 1997  . KNEE ARTHROSCOPY Right   . KNEE CARTILAGE SURGERY Left   . NASAL SEPTUM SURGERY    . STENT PLACEMENT VASCULAR ([place] [facility])  09/02/2010  . TONSILLECTOMY    . TOTAL KNEE ARTHROPLASTY Left 08/31/2018   Procedure: TOTAL KNEE ARTHROPLASTY;  Surgeon:  Dorna Leitz, MD;  Location: [facility];  Service: Orthopedics;  Laterality: Left;   Family History  Problem Relation Age of Onset  . Arthritis Mother   . Heart disease Mother        s/p CABG  . Hypertension Mother   . Alzheimer's disease Mother   . Heart attack Mother   . Arthritis Father   . Hyperlipidemia Father   . Heart disease Father        s/p CABG  . Stroke Father   . Hypertension Father   . Heart attack Father   . Multiple sclerosis Sister   . Diabetes Sister   . Lung cancer Paternal Grandmother   . Diabetes Sister   . Heart attack Sister   . Pulmonary embolism Sister        died  . Lung cancer Maternal Aunt   . Lung cancer Paternal Aunt   . Depression Brother        suicide   Social History   Tobacco Use  . Smoking status: Never Smoker  . Smokeless tobacco: Never Used  Substance Use Topics  . Alcohol use: No    Alcohol/week: 0.0 standard drinks  . Drug use: No   Current Outpatient Medications  Medication Sig Dispense Refill  . ARIPiprazole (ABILIFY) 15 MG tablet Take 1 tablet (15 mg total) by mouth daily. 30 tablet 1  . benztropine (COGENTIN) 0.5 MG tablet Take 1 tablet (0.5 mg total) by mouth at bedtime. 30 tablet 1  . clonazePAM (KLONOPIN) 0.5 MG tablet Take 1 tablet (0.5 mg total) by mouth 2 (two) times daily. 60 tablet 1  . clopidogrel (PLAVIX) 75 MG tablet TAKE 1 TABLET BY MOUTH DAILY 30 tablet 11  . diltiazem (CARTIA XT) 180 MG 24 hr capsule Take 1 capsule (180 mg total) by mouth daily. 30 capsule 11  . lamoTRIgine (LAMICTAL) 200 MG tablet Take 1 tablet (200 mg total) by mouth daily. For mood control 30 tablet 1  . nitroGLYCERIN (NITROSTAT) 0.4 MG SL tablet Place 1 tablet (0.4 mg total) under the tongue every 5 (five) minutes as needed for chest pain. 25 tablet 3  . rosuvastatin (CRESTOR) 20 MG tablet Take 1 tablet by mouth once daily 90 tablet 0   No current facility-administered medications for this visit.    Allergies  Allergen Reactions  . Ambien  [Zolpidem Tartrate] Other (See Comments)    Blackout, memory issues  . Seroquel [Quetiapine]     SEVERE NIGHTMARES, SLEEPWALK AND NIGHT DRIVE WITH NO RECOLLECTION UPON WAKENING    Review of Systems: All systems reviewed and negative except where noted in HPI.   Physical Exam:    Wt Readings from Last 3 Encounters:  07/03/19 171 lb (77.6 kg)  06/04/19 167 lb (75.8 kg)  05/20/19 170 lb (77.1 kg)    BP 130/70   Pulse (!) 58   Temp 97.8 F (36.6 C)   Ht 5' 10.75" (1.797 m)   Wt 171 lb (77.6 kg)   BMI 24.02 kg/m  Constitutional:  Pleasant male in no acute distress. Psychiatric: Normal mood and affect. Behavior is normal. EENT: Pupils normal.  Conjunctivae are normal. No scleral icterus. Neck supple.  Cardiovascular: Normal rate, regular rhythm. No edema Pulmonary/chest: Effort normal and breath sounds normal. No wheezing, rales or rhonchi. Abdominal: Soft, nondistended, nontender. Bowel sounds active throughout. There are no masses palpable. No hepatomegaly. Neurological: Alert and oriented to person place and time. Skin: Skin is warm and dry. No rashes noted.  Justin Savoy, NP  07/03/2019, 2:22 PM

## 2019-07-03 NOTE — Telephone Encounter (Signed)
[facility] Pre-operative Risk Assessment     Request for surgical clearance:     Endoscopy Procedure  What type of surgery is being performed?     Colonoscopy  When is this surgery scheduled?     07/22/19  What type of clearance is required ?   Pharmacy  Are there any medications that need to be held prior to surgery and how long? HOLD PLAVIX FIVE DAYS PRIOR  Practice name and name of physician performing surgery?      [facility]/Dr. Carlean Purl  What is your office phone and fax number?      Phone- [phone]  Fax- [phone] Attn: Peter Congo, RMA  Anesthesia type (None, local, MAC, general) ?       MAC

## 2019-07-03 NOTE — Telephone Encounter (Signed)
   Primary Cardiologist: Sherren Mocha, MD  Chart reviewed as part of pre-operative protocol coverage. Patient has a history of remote PCI with subsequent NSTEMI which was medically managed in 2012. Last ischemic evaluation was a NST in 2019 which was without ischemia. Therefore, patient can hold plavix 5 days prior to his upcoming colonoscopy. If possible, recommend patient take aspirin 81mg  daily during that period with plans to resume plavix when cleared to do so by Dr. Carlean Purl.   I will route this recommendation to the requesting party via Epic fax function and remove from pre-op pool.  Please call with questions.  Abigail Butts, PA-C 07/03/2019, 3:10 PM

## 2019-07-05 ENCOUNTER — Telehealth: Payer: Self-pay | Admitting: Nurse Practitioner

## 2019-07-05 NOTE — Telephone Encounter (Signed)
Per Dr. Burt Knack okay to hold plavix five days prior to procedure.  Patient verbalized understanding to start holding Plavix on 07/17/19.

## 2019-07-05 NOTE — Telephone Encounter (Signed)
Pt inquired about cardiac clearance.

## 2019-07-10 ENCOUNTER — Encounter: Payer: Self-pay | Admitting: Internal Medicine

## 2019-07-11 ENCOUNTER — Encounter (HOSPITAL_COMMUNITY): Payer: Self-pay | Admitting: Licensed Clinical Social Worker

## 2019-07-11 ENCOUNTER — Other Ambulatory Visit: Payer: Self-pay

## 2019-07-11 ENCOUNTER — Ambulatory Visit (INDEPENDENT_AMBULATORY_CARE_PROVIDER_SITE_OTHER): Payer: Medicare Other | Admitting: Licensed Clinical Social Worker

## 2019-07-11 DIAGNOSIS — F3162 Bipolar disorder, current episode mixed, moderate: Secondary | ICD-10-CM

## 2019-07-11 NOTE — Progress Notes (Signed)
Virtual Visit via Telephone Note  I connected with Justin [name] on 07/11/19 at 11:00 AM EST by telephone and verified that I am speaking with the correct person using two identifiers.     I discussed the limitations, risks, security and privacy concerns of performing an evaluation and management service by telephone and the availability of in person appointments. I also discussed with the patient that there may be a patient responsible charge related to this service. The patient expressed understanding and agreed to proceed.   Type of Therapy: Individual Therapy  Treatment Goals addressed: improve psychiatric symptoms, Controlled Behavior, Improve Unhelpful Thought Patterns, Emotional Regulation Skills (Moderate moods, anger management, stress management)  Interventions: CBT, Motivational Interviewing, grounding techniques and mindfulness techniques, and psychoeducation  Summary: Justin [name] is a 73y.o. male who presents with Bipolar 1 Disorder, Mixed, Moderate  Suicidal/Homicidal: No - without intent/plan  Therapist Response:  Tacari met with clinician for an individual session. [name] discussed his psychiatric symptoms, his current life events and his homework.He reports he has been doing a bit worse. He reports he continues to feel bogged down and upset about the way his son treats him. Clinician processed most recent issue, noting that it is a repetitive incident: son is nice on the phone, very rude in person, hurts Justin [name]'s feelings and leaves him without any understanding of what just happened. Clinician urged Justin [name] to speak up to his son and challenge those behaviors. Justin [name] reports fear of retribution, especially since he needs son to take him for a procedure in a couple of weeks. Clinician identified the multitude of favors and supports Justin [name] has given his son, without asking for anything in return (mowing grass, lending the car, caring for the kids, etc). Clinician encouraged  Justin [name] to speak up and change his response when his son acts out.   Plan: Return again in 1 -2 weeks.  Diagnosis: Axis I: Bipolar 1 Disorder, Mixed, Moderate     I discussed the assessment and treatment plan with the patient. The patient was provided an opportunity to ask questions and all were answered. The patient agreed with the plan and demonstrated an understanding of the instructions.   The patient was advised to call back or seek an in-person evaluation if the symptoms worsen or if the condition fails to improve as anticipated.  I provided 45 minutes of non-face-to-face time during this encounter.   [name], LCSW

## 2019-07-17 ENCOUNTER — Other Ambulatory Visit: Payer: Self-pay | Admitting: Internal Medicine

## 2019-07-17 ENCOUNTER — Ambulatory Visit (INDEPENDENT_AMBULATORY_CARE_PROVIDER_SITE_OTHER): Payer: Medicare Other

## 2019-07-17 DIAGNOSIS — Z1159 Encounter for screening for other viral diseases: Secondary | ICD-10-CM

## 2019-07-19 LAB — SARS CORONAVIRUS 2 (TAT 6-24 HRS): SARS Coronavirus 2: NEGATIVE

## 2019-07-22 ENCOUNTER — Encounter: Payer: Self-pay | Admitting: Internal Medicine

## 2019-07-22 ENCOUNTER — Ambulatory Visit (AMBULATORY_SURGERY_CENTER): Payer: Medicare Other | Admitting: Internal Medicine

## 2019-07-22 ENCOUNTER — Other Ambulatory Visit: Payer: Self-pay

## 2019-07-22 VITALS — BP 106/68 | HR 66 | Temp 97.6°F | Resp 11 | Ht 70.0 in | Wt 171.0 lb

## 2019-07-22 DIAGNOSIS — Z86010 Personal history of colonic polyps: Secondary | ICD-10-CM

## 2019-07-22 DIAGNOSIS — D122 Benign neoplasm of ascending colon: Secondary | ICD-10-CM

## 2019-07-22 DIAGNOSIS — D123 Benign neoplasm of transverse colon: Secondary | ICD-10-CM

## 2019-07-22 DIAGNOSIS — D125 Benign neoplasm of sigmoid colon: Secondary | ICD-10-CM

## 2019-07-22 DIAGNOSIS — D124 Benign neoplasm of descending colon: Secondary | ICD-10-CM | POA: Diagnosis not present

## 2019-07-22 HISTORY — PX: COLONOSCOPY WITH PROPOFOL: SHX5780

## 2019-07-22 MED ORDER — SODIUM CHLORIDE 0.9 % IV SOLN: 500.0000 mL | Freq: Once | INTRAVENOUS | Status: DC

## 2019-07-22 NOTE — Patient Instructions (Addendum)
I found and removed 8 polyps today - tiny to small. All look benign but some could be pre-cancerous again.  You also have a condition called diverticulosis - common and not usually a problem. Please read the handout provided.  Hemorrhoids were swollen some - likely due to the diarrhea from the prep.  I will let you know pathology results and when to have another routine colonoscopy by mail and/or My Chart.  Restart Plavix (clopidogrel) tomorrow.  I appreciate the opportunity to care for you. Gatha Mayer, MD, FACG YOU HAD AN ENDOSCOPIC PROCEDURE TODAY AT [facility]:   Refer to the procedure report that was given to you for any specific questions about what was found during the examination.  If the procedure report does not answer your questions, please call your gastroenterologist to clarify.  If you requested that your care partner not be given the details of your procedure findings, then the procedure report has been included in a sealed envelope for you to review at your convenience later.  YOU SHOULD EXPECT: Some feelings of bloating in the abdomen. Passage of more gas than usual.  Walking can help get rid of the air that was put into your GI tract during the procedure and reduce the bloating. If you had a lower endoscopy (such as a colonoscopy or flexible sigmoidoscopy) you may notice spotting of blood in your stool or on the toilet paper. If you underwent a bowel prep for your procedure, you may not have a normal bowel movement for a few days.  Please Note:  You might notice some irritation and congestion in your nose or some drainage.  This is from the oxygen used during your procedure.  There is no need for concern and it should clear up in a day or so.  SYMPTOMS TO REPORT IMMEDIATELY:   Following lower endoscopy (colonoscopy or flexible sigmoidoscopy):  Excessive amounts of blood in the stool  Significant tenderness or worsening of abdominal pains  Swelling of  the abdomen that is new, acute  Fever of 100F or higher   For urgent or emergent issues, a gastroenterologist can be reached at any hour by calling [phone].   DIET:  We do recommend a small meal at first, but then you may proceed to your regular diet.  Drink plenty of fluids but you should avoid alcoholic beverages for 24 hours.  MEDICATIONS: Continue present medications. Resume Plavix (Clopidogrel) at prior dose tomorrow.  Please see handouts given to you by your recovery nurse.  ACTIVITY:  You should plan to take it easy for the rest of today and you should NOT DRIVE or use heavy machinery until tomorrow (because of the sedation medicines used during the test).    FOLLOW UP: Our staff will call the number listed on your records 48-72 hours following your procedure to check on you and address any questions or concerns that you may have regarding the information given to you following your procedure. If we do not reach you, we will leave a message.  We will attempt to reach you two times.  During this call, we will ask if you have developed any symptoms of COVID 19. If you develop any symptoms (ie: fever, flu-like symptoms, shortness of breath, cough etc.) before then, please call [phone].  If you test positive for Covid 19 in the 2 weeks post procedure, please call and report this information to [place].    If any biopsies were taken you will be  contacted by phone or by letter within the next 1-3 weeks.  Please call us at [phone] if you have not heard about the biopsies in 3 weeks.   Thank you for allowing [place] to provide for your healthcare needs today.   SIGNATURES/CONFIDENTIALITY: You and/or your care partner have signed paperwork which will be entered into your electronic medical record.  These signatures attest to the fact that that the information above on your After Visit Summary has been reviewed and is understood.  Full responsibility of the confidentiality of this  discharge information lies with you and/or your care-partner.

## 2019-07-22 NOTE — Op Note (Signed)
[place] Patient Name: Justin [name] Procedure Date: 07/22/2019 1:33 PM MRN: [id number] Endoscopist: Gatha Mayer , MD Age: 73 Referring MD:  Date of Birth: 08-29-1945 Gender: Male Account #: [id number] Procedure:                Colonoscopy Indications:              Surveillance: Personal history of adenomatous                            polyps on last colonoscopy 3 years ago Medicines:                Propofol per Anesthesia, Monitored Anesthesia Care Procedure:                Pre-Anesthesia Assessment:                           - Prior to the procedure, a History and Physical                            was performed, and patient medications and                            allergies were reviewed. The patient's tolerance of                            previous anesthesia was also reviewed. The risks                            and benefits of the procedure and the sedation                            options and risks were discussed with the patient.                            All questions were answered, and informed consent                            was obtained. Prior Anticoagulants: The patient has                            taken no previous anticoagulant or antiplatelet                            agents. ASA Grade Assessment: III - A patient with                            severe systemic disease. After reviewing the risks                            and benefits, the patient was deemed in                            satisfactory condition to undergo the procedure.  After obtaining informed consent, the colonoscope                            was passed under direct vision. Throughout the                            procedure, the patient's blood pressure, pulse, and                            oxygen saturations were monitored continuously. The                            Colonoscope was introduced through the anus and   advanced to the the cecum, identified by                            appendiceal orifice and ileocecal valve. The                            colonoscopy was performed without difficulty. The                            patient tolerated the procedure well. The quality                            of the bowel preparation was good. The ileocecal                            valve, appendiceal orifice, and rectum were                            photographed. The bowel preparation used was                            Miralax via split dose instruction. Scope In: 1:38:30 PM Scope Out: 2:00:53 PM Scope Withdrawal Time: 0 hours 18 minutes 38 seconds  Total Procedure Duration: 0 hours 22 minutes 23 seconds  Findings:                 The perianal and digital rectal examinations were                            normal. Pertinent negatives include normal prostate                            (size, shape, and consistency).                           Eight sessile polyps were found in the sigmoid                            colon, descending colon, transverse colon and                            ascending colon. The polyps were 2 to  7 mm in size.                            These polyps were removed with a cold snare.                            Resection and retrieval were complete. Verification                            of patient identification for the specimen was                            done. Estimated blood loss was minimal.                           Multiple diverticula were found in the sigmoid                            colon.                           Internal hemorrhoids were found.                           A diffuse area of melanotic mucosa was found in the                            entire colon.                           The exam was otherwise without abnormality on                            direct and retroflexion views. Complications:            No immediate complications. Estimated Blood  Loss:     Estimated blood loss was minimal. Impression:               - Eight 2 to 7 mm polyps in the sigmoid colon, in                            the descending colon, in the transverse colon and                            in the ascending colon, removed with a cold snare.                            Resected and retrieved.                           - Diverticulosis in the sigmoid colon.                           - Internal hemorrhoids.                           -  Melanotic mucosa in the entire examined colon.                           - The examination was otherwise normal on direct                            and retroflexion views.                           - Personal history of colonic polyps 3 adenomas max                            8 mm 2017. Recommendation:           - Patient has a contact number available for                            emergencies. The signs and symptoms of potential                            delayed complications were discussed with the                            patient. Return to normal activities tomorrow.                            Written discharge instructions were provided to the                            patient.                           - Resume previous diet.                           - Continue present medications.                           - Resume Plavix (clopidogrel) at prior dose                            tomorrow.                           - Repeat colonoscopy is recommended for                            surveillance. The colonoscopy date will be                            determined after pathology results from today's                            exam become available for review. Gatha Mayer, MD 07/22/2019 2:14:03 PM This report has been signed electronically.

## 2019-07-22 NOTE — Progress Notes (Signed)
Pt's states no medical or surgical changes since previsit or office visit. 

## 2019-07-22 NOTE — Progress Notes (Signed)
A/ox3, pleased with MAC, report to RN 

## 2019-07-22 NOTE — Progress Notes (Signed)
Called to room to assist during endoscopic procedure.  Patient ID and intended procedure confirmed with present staff. Received instructions for my participation in the procedure from the performing physician.  

## 2019-07-24 ENCOUNTER — Telehealth: Payer: Self-pay | Admitting: *Deleted

## 2019-07-24 NOTE — Telephone Encounter (Signed)
  Follow up Call-  Call back number 07/22/2019  Post procedure Call Back phone  # [phone]  Permission to leave phone message Yes  Some recent data might be hidden     Patient questions:  Do you have a fever, pain , or abdominal swelling? No. Pain Score  0 *  Have you tolerated food without any problems? yes  Have you been able to return to your normal activities? Yes.    Do you have any questions about your discharge instructions: Diet   No. Medications  No. Follow up visit  No.  Do you have questions or concerns about your Care? No.  Actions: * If pain score is 4 or above: 1. No action needed, pain <4.Have you developed a fever since your procedure? no  2.   Have you had an respiratory symptoms (SOB or cough) since your procedure? no  3.   Have you tested positive for COVID 19 since your procedure no  4.   Have you had any family members/close contacts diagnosed with the COVID 19 since your procedure?  no   If yes to any of these questions please route to Joylene John, RN and Alphonsa Gin, Therapist, sports.

## 2019-07-28 ENCOUNTER — Encounter: Payer: Self-pay | Admitting: Internal Medicine

## 2019-07-28 DIAGNOSIS — Z86010 Personal history of colonic polyps: Secondary | ICD-10-CM

## 2019-07-28 NOTE — Progress Notes (Signed)
8 adenomas, TV adenomas recall 2023

## 2019-07-30 ENCOUNTER — Ambulatory Visit: Payer: Medicare Other | Admitting: Internal Medicine

## 2019-08-01 ENCOUNTER — Encounter (HOSPITAL_COMMUNITY): Payer: Self-pay | Admitting: Licensed Clinical Social Worker

## 2019-08-01 ENCOUNTER — Other Ambulatory Visit: Payer: Self-pay

## 2019-08-01 ENCOUNTER — Other Ambulatory Visit (HOSPITAL_COMMUNITY): Payer: Self-pay | Admitting: *Deleted

## 2019-08-01 ENCOUNTER — Ambulatory Visit (INDEPENDENT_AMBULATORY_CARE_PROVIDER_SITE_OTHER): Payer: Medicare Other | Admitting: Licensed Clinical Social Worker

## 2019-08-01 ENCOUNTER — Telehealth (HOSPITAL_COMMUNITY): Payer: Self-pay | Admitting: *Deleted

## 2019-08-01 DIAGNOSIS — F3162 Bipolar disorder, current episode mixed, moderate: Secondary | ICD-10-CM | POA: Diagnosis not present

## 2019-08-01 NOTE — Telephone Encounter (Signed)
Rx for Gabapentin 100mg  called in by writer to [facility], to be take 1 capsule 2-3 times daily as needed for anxiety per Dr.Arfeen. Probation officer informed pt. Pt verbalizes understanding.

## 2019-08-01 NOTE — Progress Notes (Signed)
Virtual Visit via Telephone Note  I connected with Justin [name] on 08/01/19 at  9:00 AM EST by telephone and verified that I am speaking with the correct person using two identifiers.     I discussed the limitations, risks, security and privacy concerns of performing an evaluation and management service by telephone and the availability of in person appointments. I also discussed with the patient that there may be a patient responsible charge related to this service. The patient expressed understanding and agreed to proceed.  Type of Therapy: Individual Therapy  Treatment Goals addressed: improve psychiatric symptoms, Controlled Behavior, Improve Unhelpful Thought Patterns, Emotional Regulation Skills (Moderate moods, anger management, stress management)  Interventions: CBT, Motivational Interviewing, grounding techniques and mindfulness techniques, and psychoeducation  Summary: Justin [name] is a 73y.o. male who presents with Bipolar 1 Disorder, Mixed, Moderate  Suicidal/Homicidal: No - without intent/plan  Therapist Response:  Justin [name] met with clinician for an individual session. Justin [name] discussed his psychiatric symptoms, his current life events and his homework.He reports he has been doing a bit worse. He reports he continues to feel more slowed down, unable to do things and get motivated to complete tasks due to Klonopin. Clinician explored sxs and contacted Dr. Adele Schilder after the session to request a med review and change. Justin [name] reported continuing problems getting along consistently with son. He reports his son now think he has early signs of dementia. Clinician discussed Justin [name]'s thoughts and feelings about this and explored Justin [name]'s assessment of his memory. Justin [name] reports that he has to look at his phone to know what the date is, and sometimes he will fall asleep midday and wakes up confused. Clinician encouraged Justin [name] to discuss these concerns with Dr. Adele Schilder at next appointment or with  his primary care physician.   Plan: Return again in 2-3 weeks.  Diagnosis: Axis I: Bipolar 1 Disorder, Mixed, Moderate     I discussed the assessment and treatment plan with the patient. The patient was provided an opportunity to ask questions and all were answered. The patient agreed with the plan and demonstrated an understanding of the instructions.   The patient was advised to call back or seek an in-person evaluation if the symptoms worsen or if the condition fails to improve as anticipated.  I provided 45 minutes of non-face-to-face time during this encounter.   [name], LCSW

## 2019-08-01 NOTE — Progress Notes (Signed)
Gabapentin 100mg  1 capsule 2-3 times per day as needed for anxiety.

## 2019-08-07 ENCOUNTER — Ambulatory Visit: Payer: Medicare Other | Admitting: Internal Medicine

## 2019-08-08 ENCOUNTER — Other Ambulatory Visit: Payer: Self-pay

## 2019-08-08 ENCOUNTER — Encounter: Payer: Self-pay | Admitting: Internal Medicine

## 2019-08-08 ENCOUNTER — Ambulatory Visit (INDEPENDENT_AMBULATORY_CARE_PROVIDER_SITE_OTHER): Payer: Medicare Other | Admitting: Internal Medicine

## 2019-08-08 DIAGNOSIS — E785 Hyperlipidemia, unspecified: Secondary | ICD-10-CM

## 2019-08-08 DIAGNOSIS — R413 Other amnesia: Secondary | ICD-10-CM | POA: Diagnosis not present

## 2019-08-08 DIAGNOSIS — I10 Essential (primary) hypertension: Secondary | ICD-10-CM

## 2019-08-08 DIAGNOSIS — R29818 Other symptoms and signs involving the nervous system: Secondary | ICD-10-CM | POA: Diagnosis not present

## 2019-08-08 DIAGNOSIS — R739 Hyperglycemia, unspecified: Secondary | ICD-10-CM

## 2019-08-08 NOTE — Patient Instructions (Signed)
Please continue all other medications as before, and refills have been done if requested.  Please have the pharmacy call with any other refills you may need.  Please continue your efforts at being more active, low cholesterol diet, and weight control.  You are otherwise up to date with prevention measures today.  Please keep your appointments with your specialists as you may have planned  You will be contacted regarding the referral for: MRI for the brain, and Neurology referral

## 2019-08-08 NOTE — Assessment & Plan Note (Signed)
stable overall by history and exam, recent data reviewed with pt, and pt to continue medical treatment as before,  to f/u any worsening symptoms or concerns  

## 2019-08-08 NOTE — Assessment & Plan Note (Signed)
?   Early dementia vs pseudodementia, for head MRI, neurology referral, cont same tx

## 2019-08-08 NOTE — Progress Notes (Signed)
Subjective:    Patient ID: Justin [name], male    DOB: Sep 10, 1945, 72 y.o.   MRN: [id number]  HPI  Here at sons insistence per pt with several months worsening memory short term as he has left on the stove and burners and iron on accidentally and son says has visited several times that he cannot recall.  Lives alone, wife died 3 yrs ago with copd and diverticulitis.  Used to drink ETOH with occasaionl 1-2 beers, but none for 6 months, and not heavy prior.  Has hx of PTSD with counseling currently, has been depressed on multiple meds followed per psychiatry.   Pt denies chest pain, increasing sob or doe, wheezing, orthopnea, PND, increased LE swelling, palpitations, dizziness or syncope.  Pt denies new neurological symptoms such as new headache, or facial or extremity weakness or numbness.  Pt denies polydipsia, polyuria, or low sugar episode.  Pt states overall good compliance with meds Past Medical History:  Diagnosis Date  . Anxiety and depression   . Arthritis    knees, c-spine // gets lumbar ESI q 6 mos  . Bipolar disorder ([place])   . CAD (coronary artery disease)    [place] 08/2009 >> LHC ([facility]) - mLAD 70, Dx 65 >> PCI:  3x15 mm Endeavor DES to mLAD and 2.5x12 mm Endeavor DES to Dx // S/p NSTEMI >> [facility] 8/12 ([facility]) - LM ok, LAD prox and mid 40; LAD stent ok, Dx stent ok, mRCA 30, mLCx 50 >> med Rx // ETT-Echo 2/17 ([facility]): Normal, EF 55-60 at rest  . Chicken pox   . 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 Grief   . History of echocardiogram    Echo 3/19: Mild concentric LVH, EF 60-65, normal wall motion, grade 1 diastolic dysfunction, mild AI, mildly dilated aortic root (40 mm), MAC, mild LAE, atrial septal lipomatous hypertrophy  . History of non-ST elevation myocardial infarction (NSTEMI)   . History of nuclear stress test    Nuclear stress test 3/19: EF 57, inferior/inferoseptal/inferolateral defect consistent with probable soft tissue attenuation (cannot exclude subendocardial  scar), no ischemia, intermediate risk >> Echo 3/19 normal EF, normal wall motion  . History of pneumonia 2011  . History of prostatitis 1990s  . Hx of adenomatous colonic polyps 06/22/2016  . Hyperlipidemia   . Hypertension   . Migraines    prior history  . PTSD (post-traumatic stress disorder)    Past Surgical History:  Procedure Laterality Date  . CERVICAL DISCECTOMY    . COLONOSCOPY  03/2017  . ELBOW SURGERY Left   . ELBOW SURGERY Right   . INGUINAL HERNIA REPAIR Bilateral    1996, 1997  . KNEE ARTHROSCOPY Right   . KNEE CARTILAGE SURGERY Left   . NASAL SEPTUM SURGERY    . STENT PLACEMENT VASCULAR ([facility])  09/02/2010  . TONSILLECTOMY    . TOTAL KNEE ARTHROPLASTY Left 08/31/2018   Procedure: TOTAL KNEE ARTHROPLASTY;  Surgeon: Dorna Leitz, MD;  Location: [facility];  Service: Orthopedics;  Laterality: Left;    reports that he has never smoked. He has never used smokeless tobacco. He reports that he does not drink alcohol or use drugs. family history includes Alzheimer's disease in his mother; Arthritis in his father and mother; Depression in his brother; Diabetes in his sister and sister; Heart attack in his father, mother, and sister; Heart disease in his father and mother; Hyperlipidemia in his father; Hypertension in his father and mother; Lung cancer in  his maternal aunt, paternal aunt, and paternal grandmother; Multiple sclerosis in his sister; Pulmonary embolism in his sister; Stroke in his father. Allergies  Allergen Reactions  . Ambien [Zolpidem Tartrate] Other (See Comments)    Blackout, memory issues  . Seroquel [Quetiapine]     SEVERE NIGHTMARES, SLEEPWALK AND NIGHT DRIVE WITH NO RECOLLECTION UPON WAKENING   Current Outpatient Medications on File Prior to Visit  Medication Sig Dispense Refill  . ARIPiprazole (ABILIFY) 15 MG tablet Take 1 tablet (15 mg total) by mouth daily. 30 tablet 1  . benztropine (COGENTIN) 0.5 MG tablet Take 1 tablet (0.5 mg total) by mouth at  bedtime. 30 tablet 1  . clopidogrel (PLAVIX) 75 MG tablet TAKE 1 TABLET BY MOUTH DAILY 30 tablet 11  . diltiazem (CARTIA XT) 180 MG 24 hr capsule Take 1 capsule (180 mg total) by mouth daily. 30 capsule 11  . lamoTRIgine (LAMICTAL) 200 MG tablet Take 1 tablet (200 mg total) by mouth daily. For mood control 30 tablet 1  . nitroGLYCERIN (NITROSTAT) 0.4 MG SL tablet Place 1 tablet (0.4 mg total) under the tongue every 5 (five) minutes as needed for chest pain. 25 tablet 3  . rosuvastatin (CRESTOR) 20 MG tablet Take 1 tablet by mouth once daily 90 tablet 0   Current Facility-Administered Medications on File Prior to Visit  Medication Dose Route Frequency Provider Last Rate Last Admin  . 0.9 %  sodium chloride infusion  500 mL Intravenous Once Gatha Mayer, MD       Review of Systems  Constitutional: Negative for other unusual diaphoresis or sweats HENT: Negative for ear discharge or swelling Eyes: Negative for other worsening visual disturbances Respiratory: Negative for stridor or other swelling  Gastrointestinal: Negative for worsening distension or other blood Genitourinary: Negative for retention or other urinary change Musculoskeletal: Negative for other MSK pain or swelling Skin: Negative for color change or other new lesions Neurological: Negative for worsening tremors and other numbness  Psychiatric/Behavioral: Negative for worsening agitation or other fatigue All otherwise neg per pt     Objective:   Physical Exam BP 116/76   Pulse 69   Temp 97.8 F (36.6 C) (Oral)   Ht _0  (1.778 m)   Wt 168 lb (76.2 kg)   SpO2 99%   BMI 24.11 kg/m  VS noted,  Constitutional: Pt appears in NAD HENT: Head: NCAT.  Right Ear: External ear normal.  Left Ear: External ear normal.  Eyes: . Pupils are equal, round, and reactive to light. Conjunctivae and EOM are normal Nose: without d/c or deformity Neck: Neck supple. Gross normal ROM Cardiovascular: Normal rate and regular rhythm.     Pulmonary/Chest: Effort normal and breath sounds without rales or wheezing.  Abd:  Soft, NT, ND, + BS, no organomegaly Neurological: Pt is alert. At baseline orientation, motor grossly intact Skin: Skin is warm. No rashes, other new lesions, no LE edema Psychiatric: Pt behavior is normal without agitation  All otherwise neg per pt  Lab Results  Component Value Date   WBC 4.7 05/20/2019   HGB 14.1 05/20/2019   HCT 40.7 05/20/2019   PLT 223 05/20/2019   GLUCOSE 118 (H) 05/20/2019   CHOL 118 05/15/2019   TRIG 72.0 05/15/2019   HDL 50.80 05/15/2019   LDLCALC 53 05/15/2019   ALT 15 05/15/2019   AST 23 05/15/2019   NA 130 (L) 05/20/2019   K 3.4 (L) 05/20/2019   CL 97 (L) 05/20/2019   CREATININE 0.89 05/20/2019  BUN 5 (L) 05/20/2019   CO2 25 05/20/2019   TSH 0.77 05/15/2019   PSA 2.42 05/15/2019   INR 0.95 08/21/2018   HGBA1C 4.8 05/15/2019       Assessment & Plan:

## 2019-08-13 ENCOUNTER — Other Ambulatory Visit: Payer: Self-pay

## 2019-08-13 ENCOUNTER — Ambulatory Visit (INDEPENDENT_AMBULATORY_CARE_PROVIDER_SITE_OTHER): Payer: Medicare Other | Admitting: Licensed Clinical Social Worker

## 2019-08-13 ENCOUNTER — Ambulatory Visit
Admission: RE | Admit: 2019-08-13 | Discharge: 2019-08-13 | Disposition: A | Discharge status: Home / Self Care | Payer: Medicare Other | Source: Ambulatory Visit | Attending: Internal Medicine | Admitting: Internal Medicine

## 2019-08-13 ENCOUNTER — Encounter (HOSPITAL_COMMUNITY): Payer: Self-pay | Admitting: Licensed Clinical Social Worker

## 2019-08-13 DIAGNOSIS — R29818 Other symptoms and signs involving the nervous system: Secondary | ICD-10-CM

## 2019-08-13 DIAGNOSIS — F3162 Bipolar disorder, current episode mixed, moderate: Secondary | ICD-10-CM

## 2019-08-13 NOTE — Progress Notes (Signed)
Virtual Visit via Telephone Note  I connected with Justin [name] on 08/13/19 at 12:30 PM EST by telephone and verified that I am speaking with the correct person using two identifiers.     I discussed the limitations, risks, security and privacy concerns of performing an evaluation and management service by telephone and the availability of in person appointments. I also discussed with the patient that there may be a patient responsible charge related to this service. The patient expressed understanding and agreed to proceed.  Type of Therapy: Individual Therapy  Treatment Goals addressed: improve psychiatric symptoms, Controlled Behavior, Improve Unhelpful Thought Patterns, Emotional Regulation Skills (Moderate moods, anger management, stress management)  Interventions: CBT, Motivational Interviewing, grounding techniques and mindfulness techniques, and psychoeducation  Summary: Justin [name] is a 73y.o. male who presents with Bipolar 1 Disorder, Mixed, Moderate  Suicidal/Homicidal: No - without intent/plan  Therapist Response:  Justin [name] met with clinician for an individual session. Justin [name] discussed his psychiatric symptoms, his current life events and his homework.He reports he has been feeling a little better since last session. He reports he has been worried about being in early stages of dementia. Clinician explored sxs and encouraged Justin [name] to do whatever testing is recommended. Justin [name] processed fears about driving if he is going to be getting confused, possibly lost. He also discussed thoughts and feelings about his plans for the holidays. Clinician processed through his options, noting that he was invited to his daughter-in-law's parents' house. He reports feeling like an outsider, or that they don't like him much because they do not talk to him much. Clinician reflected thoughts and feelings. Clinician also noted that he may be projecting those feelings on others and presenting as  someone who does not want to be engaged. Clinician identified high levels of anxiety in crowds and identified that, especially with COVID, it is not a bad idea to keep socialization low. However, clinician also identified that the thing he misses the most, is being with people, particularly his family.   Plan: Return again in 2-3 weeks.  Diagnosis: Axis I: Bipolar 1 Disorder, Mixed, Moderate     I discussed the assessment and treatment plan with the patient. The patient was provided an opportunity to ask questions and all were answered. The patient agreed with the plan and demonstrated an understanding of the instructions.   The patient was advised to call back or seek an in-person evaluation if the symptoms worsen or if the condition fails to improve as anticipated.  I provided 45 minutes of non-face-to-face time during this encounter.   [name], LCSW

## 2019-08-14 ENCOUNTER — Encounter: Payer: Self-pay | Admitting: Internal Medicine

## 2019-08-20 ENCOUNTER — Other Ambulatory Visit: Payer: Self-pay | Admitting: Physician Assistant

## 2019-08-27 ENCOUNTER — Encounter (HOSPITAL_COMMUNITY): Payer: Self-pay | Admitting: Family Medicine

## 2019-08-27 ENCOUNTER — Emergency Department (HOSPITAL_COMMUNITY): Payer: Medicare Other

## 2019-08-27 ENCOUNTER — Emergency Department (HOSPITAL_COMMUNITY)
Admission: EM | Admit: 2019-08-27 | Discharge: 2019-08-28 | Disposition: A | Discharge status: Home / Self Care | Payer: Medicare Other | Attending: Emergency Medicine | Admitting: Emergency Medicine

## 2019-08-27 ENCOUNTER — Other Ambulatory Visit: Payer: Self-pay

## 2019-08-27 ENCOUNTER — Ambulatory Visit: Payer: Self-pay | Admitting: *Deleted

## 2019-08-27 DIAGNOSIS — I2510 Atherosclerotic heart disease of native coronary artery without angina pectoris: Secondary | ICD-10-CM | POA: Insufficient documentation

## 2019-08-27 DIAGNOSIS — I10 Essential (primary) hypertension: Secondary | ICD-10-CM | POA: Diagnosis not present

## 2019-08-27 DIAGNOSIS — Z96652 Presence of left artificial knee joint: Secondary | ICD-10-CM | POA: Insufficient documentation

## 2019-08-27 DIAGNOSIS — Z79899 Other long term (current) drug therapy: Secondary | ICD-10-CM | POA: Insufficient documentation

## 2019-08-27 DIAGNOSIS — R0602 Shortness of breath: Secondary | ICD-10-CM

## 2019-08-27 DIAGNOSIS — U071 COVID-19: Secondary | ICD-10-CM | POA: Diagnosis not present

## 2019-08-27 DIAGNOSIS — J069 Acute upper respiratory infection, unspecified: Secondary | ICD-10-CM

## 2019-08-27 LAB — BASIC METABOLIC PANEL
Anion gap: 14 (ref 5–15)
BUN: 21 mg/dL (ref 8–23)
CO2: 18 mmol/L — ABNORMAL LOW (ref 22–32)
Calcium: 9.6 mg/dL (ref 8.9–10.3)
Chloride: 103 mmol/L (ref 98–111)
Creatinine, Ser: 1.01 mg/dL (ref 0.61–1.24)
GFR calc Af Amer: >60 mL/min (ref >60)
GFR calc non Af Amer: >60 mL/min (ref >60)
Glucose, Bld: 75 mg/dL (ref 70–99)
Potassium: 3.9 mmol/L (ref 3.5–5.1)
Sodium: 135 mmol/L (ref 135–145)

## 2019-08-27 LAB — D-DIMER, QUANTITATIVE: D-Dimer, Quant: 0.35 ug/mL-FEU (ref 0.00–0.50)

## 2019-08-27 LAB — CBC WITH DIFFERENTIAL/PLATELET
Abs Immature Granulocytes: 0.01 10*3/uL (ref 0.00–0.07)
Basophils Absolute: 0 10*3/uL (ref 0.0–0.1)
Basophils Relative: 1 %
Eosinophils Absolute: 0 10*3/uL (ref 0.0–0.5)
Eosinophils Relative: 1 %
HCT: 42.4 % (ref 39.0–52.0)
Hemoglobin: 14.1 g/dL (ref 13.0–17.0)
Immature Granulocytes: 0 %
Lymphocytes Relative: 39 %
Lymphs Abs: 1.2 10*3/uL (ref 0.7–4.0)
MCH: 32.1 pg (ref 26.0–34.0)
MCHC: 33.3 g/dL (ref 30.0–36.0)
MCV: 96.6 fL (ref 80.0–100.0)
Monocytes Absolute: 0.4 10*3/uL (ref 0.1–1.0)
Monocytes Relative: 11 %
Neutro Abs: 1.5 10*3/uL — ABNORMAL LOW (ref 1.7–7.7)
Neutrophils Relative %: 48 %
Platelets: 240 10*3/uL (ref 150–400)
RBC: 4.39 MIL/uL (ref 4.22–5.81)
RDW: 12.1 % (ref 11.5–15.5)
WBC: 3.1 10*3/uL — ABNORMAL LOW (ref 4.0–10.5)
nRBC: 0 % (ref 0.0–0.2)

## 2019-08-27 LAB — POC SARS CORONAVIRUS 2 AG -  ED: SARS Coronavirus 2 Ag: POSITIVE — AB

## 2019-08-27 LAB — TROPONIN I (HIGH SENSITIVITY)
Troponin I (High Sensitivity): 3 ng/L (ref <18)
Troponin I (High Sensitivity): 3 ng/L (ref <18)

## 2019-08-27 MED ORDER — ALBUTEROL SULFATE HFA 108 (90 BASE) MCG/ACT IN AERS
4.0000 | INHALATION_SPRAY | Freq: Once | RESPIRATORY_TRACT | Status: AC
  Administered 2019-08-27: 4 via RESPIRATORY_TRACT
  Filled 2019-08-27: qty 6.7

## 2019-08-27 NOTE — ED Provider Notes (Signed)
[facility] DEPT Provider Note   CSN: [id number] Arrival date & time: 08/27/19  1828     History Chief Complaint  Patient presents with  . Shortness of Breath  . Cough  . Loss Of Smell    Justin [name] is a 74 y.o. male with a past medical history significant for bipolar disorder, CAD, history of NSTEMI, hyperlipidemia, hypertension, and PTSD presents to the ED due to worsening shortness of breath, cough, and loss of smell x 2 days. Patient states he has progressively become more short of breath over the past 2 weeks, but just developed a cough and loss of smell over the past 2 days. Patient states he was diagnosed with asthma back in 2010 when he was hospitalized for pneumonia, but hasn't used an inhaler since. Patient denies COVID exposures and sick contacts. He is retired and lives alone. Patient admits to receiving his flu vaccine this year. Patient also endorses worsening fatigue. He admits to abdominal pain over the weekend that has completely resolved. Patient has not tried anything for his symptoms. Patient denies chest pain, nausea, vomiting, diarrhea, and fever. Denies history of blood clots and lower extremity edema.   Chart reviewed. Patient had an echocardiogram performed on 11/17/2017 which demonstrated mild concentric LVH with an EF of 60-65%. Echo illustrated grade 1 diastolic dysfunction.  Past Medical History:  Diagnosis Date  . Anxiety and depression   . Arthritis    knees, c-spine // gets lumbar ESI q 6 mos  . Bipolar disorder ([place])   . CAD (coronary artery disease)    [place] 08/2009 >> LHC ([facility]) - mLAD 70, Dx 65 >> PCI:  3x15 mm Endeavor DES to mLAD and 2.5x12 mm Endeavor DES to Dx // S/p NSTEMI >> LHC 8/12 ([facility]) - LM ok, LAD prox and mid 40; LAD stent ok, Dx stent ok, mRCA 30, mLCx 50 >> med Rx // ETT-Echo 2/17 ([facility]): Normal, EF 55-60 at rest  . Chicken pox   . 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 Grief   . History of  echocardiogram    Echo 3/19: Mild concentric LVH, EF 60-65, normal wall motion, grade 1 diastolic dysfunction, mild AI, mildly dilated aortic root (40 mm), MAC, mild LAE, atrial septal lipomatous hypertrophy  . History of non-ST elevation myocardial infarction (NSTEMI)   . History of nuclear stress test    Nuclear stress test 3/19: EF 57, inferior/inferoseptal/inferolateral defect consistent with probable soft tissue attenuation (cannot exclude subendocardial scar), no ischemia, intermediate risk >> Echo 3/19 normal EF, normal wall motion  . History of pneumonia 2011  . History of prostatitis 1990s  . Hx of adenomatous colonic polyps 06/22/2016  . Hyperlipidemia   . Hypertension   . Migraines    prior history  . PTSD (post-traumatic stress disorder)     Patient Active Problem List   Diagnosis Date Noted  . Memory loss or impairment 08/08/2019  . Postoperative anemia due to acute blood loss 09/03/2018  . Primary osteoarthritis of left knee 08/31/2018  . Neck pain 12/08/2017  . Hyperlipidemia 10/30/2017  . Anemia 06/19/2017  . Hyperglycemia 06/19/2017  . Elevated prolactin level 06/19/2017  . Closed fracture of right hand 06/19/2017  . Bipolar affective disorder, depressed, severe ([place]) 06/12/2017  . Benzodiazepine withdrawal with complication ([place]) 35/32/9924  . Nail lesion 02/25/2017  . Hx of adenomatous colonic polyps 06/22/2016  . Memory changes 06/09/2016  . CAD (coronary artery disease)   . Right knee  pain 01/01/2016  . Olecranon bursitis of right elbow 12/10/2015  . Bipolar I disorder ([place]) 06/26/2015  . Routine general medical examination at a health care facility 03/26/2015  . Medicare annual wellness visit, subsequent 03/26/2015  . Rash and nonspecific skin eruption 03/13/2015  . Generalized anxiety disorder 12/23/2014  . Low back pain 12/23/2014  . Essential hypertension 12/23/2014    Past Surgical History:  Procedure Laterality Date  . CERVICAL DISCECTOMY    .  COLONOSCOPY  03/2017  . ELBOW SURGERY Left   . ELBOW SURGERY Right   . INGUINAL HERNIA REPAIR Bilateral    1996, 1997  . KNEE ARTHROSCOPY Right   . KNEE CARTILAGE SURGERY Left   . NASAL SEPTUM SURGERY    . STENT PLACEMENT VASCULAR ([facility])  09/02/2010  . TONSILLECTOMY    . TOTAL KNEE ARTHROPLASTY Left 08/31/2018   Procedure: TOTAL KNEE ARTHROPLASTY;  Surgeon: Dorna Leitz, MD;  Location: [facility];  Service: Orthopedics;  Laterality: Left;       Family History  Problem Relation Age of Onset  . Arthritis Mother   . Heart disease Mother        s/p CABG  . Hypertension Mother   . Alzheimer's disease Mother   . Heart attack Mother   . Arthritis Father   . Hyperlipidemia Father   . Heart disease Father        s/p CABG  . Stroke Father   . Hypertension Father   . Heart attack Father   . Multiple sclerosis Sister   . Diabetes Sister   . Lung cancer Paternal Grandmother   . Diabetes Sister   . Heart attack Sister   . Pulmonary embolism Sister        died  . Lung cancer Maternal Aunt   . Lung cancer Paternal Aunt   . Depression Brother        suicide    Social History   Tobacco Use  . Smoking status: Never Smoker  . Smokeless tobacco: Never Used  Substance Use Topics  . Alcohol use: No    Alcohol/week: 0.0 standard drinks  . Drug use: No    Home Medications Prior to Admission medications   Medication Sig Start Date End Date Taking? Authorizing Provider  ARIPiprazole (ABILIFY) 15 MG tablet Take 1 tablet (15 mg total) by mouth daily. 06/26/19   Arfeen, Arlyce Harman, MD  benztropine (COGENTIN) 0.5 MG tablet Take 1 tablet (0.5 mg total) by mouth at bedtime. 06/26/19 06/25/20  Arfeen, Arlyce Harman, MD  clopidogrel (PLAVIX) 75 MG tablet TAKE 1 TABLET BY MOUTH DAILY 05/30/19   Richardson Dopp T, PA-C  diltiazem (CARTIA XT) 180 MG 24 hr capsule Take 1 capsule (180 mg total) by mouth daily. 06/04/19   Richardson Dopp T, PA-C  lamoTRIgine (LAMICTAL) 200 MG tablet Take 1 tablet (200 mg total) by  mouth daily. For mood control 06/26/19   Arfeen, Arlyce Harman, MD  nitroGLYCERIN (NITROSTAT) 0.4 MG SL tablet Place 1 tablet (0.4 mg total) under the tongue every 5 (five) minutes as needed for chest pain. 06/04/19   Richardson Dopp T, PA-C  rosuvastatin (CRESTOR) 20 MG tablet Take 1 tablet by mouth once daily 08/20/19   Richardson Dopp T, PA-C    Allergies    Ambien [zolpidem tartrate] and Seroquel [quetiapine]  Review of Systems   Review of Systems  Constitutional: Positive for chills and fatigue. Negative for fever.  HENT: Positive for congestion. Negative for ear pain and sore throat.  Respiratory: Positive for cough and shortness of breath.   Cardiovascular: Negative for chest pain and leg swelling.  Gastrointestinal: Positive for abdominal pain (resolved). Negative for diarrhea, nausea and vomiting.  Neurological: Negative for dizziness, weakness and headaches.  All other systems reviewed and are negative.   Physical Exam Updated Vital Signs BP 113/85 (BP Location: Left Arm)   Pulse 76   Temp 97.7 F (36.5 C) (Oral)   Resp (!) 22   Ht 5' 10.5" (1.791 m)   Wt 72.6 kg   SpO2 100%   BMI 22.63 kg/m   Physical Exam Vitals and nursing note reviewed.  Constitutional:      General: He is not in acute distress.    Appearance: He is ill-appearing.  HENT:     Head: Normocephalic.     Mouth/Throat:     Pharynx: No posterior oropharyngeal erythema.  Eyes:     Conjunctiva/sclera: Conjunctivae normal.     Pupils: Pupils are equal, round, and reactive to light.  Neck:     Comments: No meningismus Cardiovascular:     Rate and Rhythm: Normal rate and regular rhythm.     Pulses: Normal pulses.     Heart sounds: Normal heart sounds. No murmur. No friction rub. No gallop.   Pulmonary:     Effort: Pulmonary effort is normal.     Breath sounds: Normal breath sounds.     Comments: tachypneic at 24. Ambulated in room and maintained O2 saturation between 98-100%. Patient coughing repeatedly  during exam. Respirations equal and labored, patient speaking in broken up sentences, lungs clear to auscultation bilaterally  Abdominal:     General: Abdomen is flat. Bowel sounds are normal. There is no distension.     Palpations: Abdomen is soft.     Tenderness: There is no abdominal tenderness. There is no guarding or rebound.  Musculoskeletal:     Cervical back: Neck supple.     Right lower leg: No edema.     Left lower leg: No edema.     Comments: Able to move all 4 extremities without difficulty. No lower extremity edema. Negative homans sign bilaterally.   Skin:    General: Skin is warm.  Neurological:     General: No focal deficit present.     Mental Status: He is alert.     ED Results / Procedures / Treatments   Labs (all labs ordered are listed, but only abnormal results are displayed) Labs Reviewed  POC SARS CORONAVIRUS 2 AG -  ED    EKG None  Radiology DG Chest Portable 1 View  Result Date: 08/27/2019 CLINICAL DATA:  Shortness of breath EXAM: PORTABLE CHEST 1 VIEW COMPARISON:  06/12/2017 FINDINGS: There is scarring versus atelectasis in the left mid lung zone. There is no pneumothorax. No large pleural effusion. The heart size is normal. Aortic calcifications are noted. Lungs appear to be slightly hyperexpanded. There is no acute osseous abnormality. IMPRESSION: No active disease. Electronically Signed   By: Constance Holster M.D.   On: 08/27/2019 19:28    Procedures Procedures (including critical care time)  Medications Ordered in ED Medications - No data to display  ED Course  I have reviewed the triage vital signs and the nursing notes.  Pertinent labs & imaging results that were available during my care of the patient were reviewed by me and considered in my medical decision making (see chart for details).    MDM Rules/Calculators/A&P  74 year old male presents to the ED due to worsening cough, fatigue, and loss of smell x 2 days.  Patient admits to shortness of breath over the past 2 weeks. Patient states he was diagnosed with asthma back in 2010 when he was hospitalized with pneumonia, but hasn't used an inhaler since. Echocardiogram back in 2019 demonstrates normal EF with grade 1 diastolic dysfunction. Patient is afebrile, not tachycardic or hypoxic. I ambulated patient in the room and he maintained O2 saturation between 98-100%. Patient tachypneic between 24-30 during initial exam. Patient speaking in broken up sentences with increased work of breathing. Lungs clear to auscultation bilaterally with no wheeze, rhonchi, or rales. No lower extremity edema. Negative homans sign bilaterally. Suspect patient's symptoms could be related to COVID-19 infection, asthma exacerbation, PE, or CHF. Patient does not appear fluid overloaded so less suspicion for CHF. Will obtain routine labs, troponin, d-dimer, CXR, rapid COVID test, and EKG. Discussed case with Dr. Sherry Ruffing who agrees with assessment and plan.  CXR personally reviewed which is negative for pneumonia and PTX.   Patient handed off to Etta Quill, PA-C at shift change who will follow-up with pending labs, reassess patient, and determine disposition.   Justin [name] was evaluated in Emergency Department on 08/27/2019 for the symptoms described in the history of present illness. He was evaluated in the context of the global COVID-19 pandemic, which necessitated consideration that the patient might be at risk for infection with the SARS-CoV-2 virus that causes COVID-19. Institutional protocols and algorithms that pertain to the evaluation of patients at risk for COVID-19 are in a state of rapid change based on information released by regulatory bodies including the CDC and federal and state organizations. These policies and algorithms were followed during the patient's care in the ED. Final Clinical Impression(s) / ED Diagnoses Final diagnoses:  None    Rx / DC Orders ED Discharge  Orders    None       Karie Kirks 08/27/19 2104    Tegeler, Gwenyth Allegra, MD 08/28/19 [phone]

## 2019-08-27 NOTE — ED Provider Notes (Signed)
Patient received in sign out from C. Aberman, PA-C.  Patient presented today with shortness of breath, cough, and loss of smell for 2 days. He tested COVID positive today. He has remained somewhat tachypneic, but lungs are clear and oxygen saturations are 100%. No increased oxygen demand with activity. No chest pain. Have discussed with Dr. Sherry Ruffing. Will discharge home with strict return precautions.  Results for orders placed or performed during the hospital encounter of 08/27/19  CBC with Differential  Result Value Ref Range   WBC 3.1 (L) 4.0 - 10.5 K/uL   RBC 4.39 4.22 - 5.81 MIL/uL   Hemoglobin 14.1 13.0 - 17.0 g/dL   HCT 42.4 39.0 - 52.0 %   MCV 96.6 80.0 - 100.0 fL   MCH 32.1 26.0 - 34.0 pg   MCHC 33.3 30.0 - 36.0 g/dL   RDW 12.1 11.5 - 15.5 %   Platelets 240 150 - 400 K/uL   nRBC 0.0 0.0 - 0.2 %   Neutrophils Relative % 48 %   Neutro Abs 1.5 (L) 1.7 - 7.7 K/uL   Lymphocytes Relative 39 %   Lymphs Abs 1.2 0.7 - 4.0 K/uL   Monocytes Relative 11 %   Monocytes Absolute 0.4 0.1 - 1.0 K/uL   Eosinophils Relative 1 %   Eosinophils Absolute 0.0 0.0 - 0.5 K/uL   Basophils Relative 1 %   Basophils Absolute 0.0 0.0 - 0.1 K/uL   Immature Granulocytes 0 %   Abs Immature Granulocytes 0.01 0.00 - 0.07 K/uL  Basic metabolic panel  Result Value Ref Range   Sodium 135 135 - 145 mmol/L   Potassium 3.9 3.5 - 5.1 mmol/L   Chloride 103 98 - 111 mmol/L   CO2 18 (L) 22 - 32 mmol/L   Glucose, Bld 75 70 - 99 mg/dL   BUN 21 8 - 23 mg/dL   Creatinine, Ser 1.01 0.61 - 1.24 mg/dL   Calcium 9.6 8.9 - 10.3 mg/dL   GFR calc non Af Amer >60 >60 mL/min   GFR calc Af Amer >60 >60 mL/min   Anion gap 14 5 - 15  D-dimer, quantitative (not at [facility])  Result Value Ref Range   D-Dimer, Quant 0.35 0.00 - 0.50 ug/mL-FEU  POC SARS Coronavirus 2 Ag-ED - Nasal Swab (BD Veritor Kit)  Result Value Ref Range   SARS Coronavirus 2 Ag POSITIVE (A) NEGATIVE  Troponin I (High Sensitivity)  Result Value Ref Range   Troponin I (High Sensitivity) 3 <18 ng/L   MR Brain Wo Contrast  Result Date: 08/14/2019 CLINICAL DATA:  Subacute neuro deficits. Blackout spells and confusion EXAM: MRI HEAD WITHOUT CONTRAST TECHNIQUE: Multiplanar, multiecho pulse sequences of the brain and surrounding structures were obtained without intravenous contrast. COMPARISON:  Head CT 05/20/2019 and 11/26/2009 FINDINGS: Brain: Brain atrophy that is generalized and overall mild for age. No white matter disease or prior infarct. No hydrocephalus, collection, or masslike finding. Vascular: Normal flow voids Skull and upper cervical spine: Normal marrow signal. Sinuses/Orbits: Negative IMPRESSION: 1. No reversible or specific finding to explain symptoms. 2. Brain atrophy since a 2011 head CT that is generalized and likely congruent with aging. Electronically Signed   By: Monte Fantasia M.D.   On: 08/14/2019 09:17   DG Chest Portable 1 View  Result Date: 08/27/2019 CLINICAL DATA:  Shortness of breath EXAM: PORTABLE CHEST 1 VIEW COMPARISON:  06/12/2017 FINDINGS: There is scarring versus atelectasis in the left mid lung zone. There is no pneumothorax. No large pleural  effusion. The heart size is normal. Aortic calcifications are noted. Lungs appear to be slightly hyperexpanded. There is no acute osseous abnormality. IMPRESSION: No active disease. Electronically Signed   By: Constance Holster M.D.   On: 08/27/2019 19:28     Etta Quill, NP 08/27/19 2328    Tegeler, Gwenyth Allegra, MD 08/28/19 [phone]

## 2019-08-27 NOTE — Discharge Instructions (Signed)
Please refer to the attached instructions. Return to the ED if your shortness of breath continues to worsen, especially  with activity. Please contact your primary care provider in the morning.

## 2019-08-27 NOTE — Telephone Encounter (Signed)
Pt called with having shortness of breath, chills, feeling warmer than usual (have not checked temp), fatigue and cough.  He is having night sweats and this started New Year's day. He was out and about the week before New Years.  He stayed in the bed from New Years day  until Sunday. He sounds very winded. Denies any respiratory diseases. He was out driving today and felt dizzy, like he was going to pass out. He is advised to go to the ED per protocol for shortness of breath. He will call 911. Routing to [facility] at [facility] for review.  Reason for Disposition . MODERATE difficulty breathing (e.g., speaks in phrases, SOB even at rest, pulse 100-120)  Answer Assessment - Initial Assessment Questions 1. COVID-19 DIAGNOSIS: "Who made your Coronavirus (COVID-19) diagnosis?" "Was it confirmed by a positive lab test?" If not diagnosed by a HCP, ask "Are there lots of cases (community spread) where you live?" (See public health department website, if unsure)    In the community 2. COVID-19 EXPOSURE: "Was there any known exposure to COVID before the symptoms began?" CDC Definition of close contact: within 6 feet (2 meters) for a total of 15 minutes or more over a 24-hour period.      Thinks so, been to the grocery store and [employer] 3. ONSET: "When did the COVID-19 symptoms start?"      New Year's day 4. WORST SYMPTOM: "What is your worst symptom?" (e.g., cough, fever, shortness of breath, muscle aches)     Shortness of breath 5. COUGH: "Do you have a cough?" If so, ask: "How bad is the cough?"       Cough 6. FEVER: "Do you have a fever?" If so, ask: "What is your temperature, how was it measured, and when did it start?"     Feels warm, has not checked temp 7. RESPIRATORY STATUS: "Describe your breathing?" (e.g., shortness of breath, wheezing, unable to speak)      Shortness of breath 8. BETTER-SAME-WORSE: "Are you getting better, staying the same or getting worse compared to yesterday?"  If getting  worse, ask, "In what way?"     Feels a little bit worst 9. HIGH RISK DISEASE: "Do you have any chronic medical problems?" (e.g., asthma, heart or lung disease, weak immune system, obesity, etc.)     2 cardiac stents,  10. PREGNANCY: "Is there any chance you are pregnant?" "When was your last menstrual period?"       n/a 11. OTHER SYMPTOMS: "Do you have any other symptoms?"  (e.g., chills, fatigue, headache, loss of smell or taste, muscle pain, sore throat; new loss of smell or taste especially support the diagnosis of COVID-19)       Chills, fatigue, loss of smell and taste  Protocols used: CORONAVIRUS (COVID-19) DIAGNOSED OR SUSPECTED-A-AH

## 2019-08-27 NOTE — ED Notes (Signed)
Pt ambulated with pulse ox. Oxygen levels remained at 100%, but patient's respirations were labored.

## 2019-08-27 NOTE — ED Triage Notes (Signed)
Patient is from home and transported via [facility]. Patient is experiencing SHOB, cough, and loss of smell for the last two days.

## 2019-08-28 ENCOUNTER — Ambulatory Visit (INDEPENDENT_AMBULATORY_CARE_PROVIDER_SITE_OTHER): Payer: Medicare Other | Admitting: Licensed Clinical Social Worker

## 2019-08-28 ENCOUNTER — Encounter (HOSPITAL_COMMUNITY): Payer: Self-pay | Admitting: Licensed Clinical Social Worker

## 2019-08-28 ENCOUNTER — Ambulatory Visit: Payer: Self-pay

## 2019-08-28 DIAGNOSIS — F3162 Bipolar disorder, current episode mixed, moderate: Secondary | ICD-10-CM | POA: Diagnosis not present

## 2019-08-28 NOTE — Progress Notes (Signed)
Virtual Visit via Telephone Note  I connected with Justin [name] on 08/28/19 at 11:00 AM EST by telephone and verified that I am speaking with the correct person using two identifiers.     I discussed the limitations, risks, security and privacy concerns of performing an evaluation and management service by telephone and the availability of in person appointments. I also discussed with the patient that there may be a patient responsible charge related to this service. The patient expressed understanding and agreed to proceed.  Type of Therapy: Individual Therapy  Treatment Goals addressed: improve psychiatric symptoms, Controlled Behavior, Improve Unhelpful Thought Patterns, Emotional Regulation Skills (Moderate moods, anger management, stress management)  Interventions: CBT, Motivational Interviewing, grounding techniques and mindfulness techniques, and psychoeducation  Summary: Justin [name] is a 73y.o. male who presents with Bipolar 1 Disorder, Mixed, Moderate  Suicidal/Homicidal: No - without intent/plan  Therapist Response:  Justin [name] met with clinician for an individual session. Justin [name] discussed his psychiatric symptoms, his current life events and his homework.He reports he has been feeling very upset and concerned due to being diagnosed with COVID yesterday at the hospital. He demonstrated shortness of breath in session, which had eased in the morning, but was bad again by the time of the session. Clinician discussed contact tracing and reported that he had already notified everyone he'd been in contact with over the past several days. Clinician explored symptoms and noted that it has been worst over the past few days, especially with his breathing. Justin [name] reported fears of dying. Clinician reflected the change in his attitude toward life now that he was met with this virus. Clinician identified that prior to this, depression and passive suicidal thoughts were common. Now that he is  sick, he reports feeling very scared and worried that he won't be able to see his children and grandchildren again. Clinician identified the importance of gratitude and mindfulness about the here and now, especially related to his family relationships.  Justin [name] reports a great deal of anxiety and wish to return to Xanax. Clinician will notify Dr Adele Schilder. Justin [name] reports he will bring it up at session on Friday.   Plan: Return again in 2-3weeks. Clinician notified Dr. Adele Schilder about dx, also noting that anxiety is high.   Diagnosis: Axis I: Bipolar 1 Disorder, Mixed, Moderate    I discussed the assessment and treatment plan with the patient. The patient was provided an opportunity to ask questions and all were answered. The patient agreed with the plan and demonstrated an understanding of the instructions.   The patient was advised to call back or seek an in-person evaluation if the symptoms worsen or if the condition fails to improve as anticipated.  I provided 45 minutes of non-face-to-face time during this encounter.   [name], LCSW

## 2019-08-28 NOTE — Telephone Encounter (Signed)
Pt. Reports he still has shortness of breath with minimal exertion. States he feels better today.Pt. sounds very short of breath with conversation. States they sent him home with an Albuterol inhaler."It has helped a little." Instructed pt. To call 911 for transportation back to the ED. Verbalizes understanding, but states "I'm going to wait a little while and see how I feel."  Reason for Disposition . Sounds like a life-threatening emergency to the triager  Answer Assessment - Initial Assessment Questions 1. RESPIRATORY STATUS: "Describe your breathing?" (e.g., wheezing, shortness of breath, unable to speak, severe coughing)      Shortness of breath 2. ONSET: "When did this breathing problem begin?"      Yesterday 3. PATTERN "Does the difficult breathing come and go, or has it been constant since it started?"      Constant 4. SEVERITY: "How bad is your breathing?" (e.g., mild, moderate, severe)    - MILD: No SOB at rest, mild SOB with walking, speaks normally in sentences, can lay down, no retractions, pulse < 100.    - MODERATE: SOB at rest, SOB with minimal exertion and prefers to sit, cannot lie down flat, speaks in phrases, mild retractions, audible wheezing, pulse 100-120.    - SEVERE: Very SOB at rest, speaks in single words, struggling to breathe, sitting hunched forward, retractions, pulse > 120      Moderate 5. RECURRENT SYMPTOM: "Have you had difficulty breathing before?" If so, ask: "When was the last time?" and "What happened that time?"      Yesterday 6. CARDIAC HISTORY: "Do you have any history of heart disease?" (e.g., heart attack, angina, bypass surgery, angioplasty)      Yes 7. LUNG HISTORY: "Do you have any history of lung disease?"  (e.g., pulmonary embolus, asthma, emphysema)     No 8. CAUSE: "What do you think is causing the breathing problem?"      COVID 19 9. OTHER SYMPTOMS: "Do you have any other symptoms? (e.g., dizziness, runny nose, cough, chest pain, fever)  Sneezing, cough 10. PREGNANCY: "Is there any chance you are pregnant?" "When was your last menstrual period?"       n/a 11. TRAVEL: "Have you traveled out of the country in the last month?" (e.g., travel history, exposures)       No  Protocols used: BREATHING DIFFICULTY-A-AH

## 2019-08-30 ENCOUNTER — Ambulatory Visit (INDEPENDENT_AMBULATORY_CARE_PROVIDER_SITE_OTHER): Payer: Medicare Other | Admitting: Psychiatry

## 2019-08-30 ENCOUNTER — Encounter (HOSPITAL_COMMUNITY): Payer: Self-pay | Admitting: Psychiatry

## 2019-08-30 ENCOUNTER — Other Ambulatory Visit: Payer: Self-pay

## 2019-08-30 DIAGNOSIS — F4310 Post-traumatic stress disorder, unspecified: Secondary | ICD-10-CM | POA: Diagnosis not present

## 2019-08-30 DIAGNOSIS — F3162 Bipolar disorder, current episode mixed, moderate: Secondary | ICD-10-CM

## 2019-08-30 DIAGNOSIS — F411 Generalized anxiety disorder: Secondary | ICD-10-CM

## 2019-08-30 MED ORDER — LAMOTRIGINE 200 MG PO TABS: 200.0000 mg | ORAL_TABLET | Freq: Every day | ORAL | 1 refills | Status: DC

## 2019-08-30 MED ORDER — BENZTROPINE MESYLATE 0.5 MG PO TABS: 0.5000 mg | ORAL_TABLET | Freq: Every day | ORAL | 1 refills | Status: DC

## 2019-08-30 MED ORDER — ARIPIPRAZOLE 15 MG PO TABS: 15.0000 mg | ORAL_TABLET | Freq: Every day | ORAL | 1 refills | Status: DC

## 2019-08-30 MED ORDER — CLONAZEPAM 0.5 MG PO TABS: ORAL_TABLET | ORAL | 1 refills | Status: DC

## 2019-08-30 NOTE — Progress Notes (Signed)
Virtual Visit via Telephone Note  I connected with Justin [name] on 08/30/19 at 10:20 AM EST by telephone and verified that I am speaking with the correct person using two identifiers.   I discussed the limitations, risks, security and privacy concerns of performing an evaluation and management service by telephone and the availability of in person appointments. I also discussed with the patient that there may be a patient responsible charge related to this service. The patient expressed understanding and agreed to proceed.   History of Present Illness: Patient was evaluated by phone session.  Patient was recently had a visit to ER due to breathing issues.  He was Covid positive.  Patient is still have a lot of difficulty breathing and talking due to shortness of breath.  He admitted increased anxiety due to Covid.  We have tried gabapentin but that did not help him and just make him more sleepy.  On the last visit we increase Abilify and added Cogentin and he noticed his depression is much better but now he is more anxious since he had a Covid.  He is in therapy with Janett Billow.  Denies any hallucination, paranoia but admitted increased anxiety.  Sometimes he has nightmares and flashback about his past trauma.  He denies any suicidal thoughts or homicidal thought.  His appetite is low.  He lost 5 pounds but he is more hopeful denies any suicidal thoughts.  He had a good Christmas he was able to see his grandkids and he mentioned his son and daughter-in-law are very supportive and in good relationship with him.  He has no tremors or shakes.    Past Psychiatric History:Reviewed. H/O suicidal attemptby taking overdose on Ambien twice, hydroxyzine and alcohol. H/Obipolar disorder with multipleinpatient. DidIOP. LastER visit Sept 2020 and inpatient October 2018 at [facility]. Tried Zyprexa, Cymbalta, Seroquel, Vistaril, Neurontin, Wellbutrin and triptal. Tried low-dose doxepin and trazodone. Had a reaction  with Ativan and pain medication. SawDr. Letta Moynahan inpast.   Recent Results (from the past 2160 hour(s))  SARS Coronavirus 2 (TAT 6-24 hrs)     Status: None   Collection Time: 07/17/19 12:00 AM  Result Value Ref Range   SARS Coronavirus 2 RESULT:  NEGATIVE     Comment: RESULT:  NEGATIVESARS-CoV-2 INTERPRETATION:A NEGATIVE  test result means that SARS-CoV-2 RNA was not present in the specimen above the limit of detection of this test. This does not preclude a possible SARS-CoV-2 infection and should not be used as the  sole basis for patient management decisions. Negative results must be combined with clinical observations, patient history, and epidemiological information. Optimum specimen types and timing for peak viral levels during infections caused by SARS-CoV-2  have not been determined. Collection of multiple specimens or types of specimens may be necessary to detect virus. Improper specimen collection and handling, sequence variability under primers/probes, or organism present below the limit of detection may  lead to false negative results. Positive and negative predictive values of testing are highly dependent on prevalence. False negative test results are more likely when prevalence of disease is high.The expected result is NEGATIVE.Fact  Sheet for  Healthcare Providers: https://www.woods-mathews.com/.Fact Sheet for Patients: SugarRoll.be.Normal Reference Range - Negative   CBC with Differential     Status: Abnormal   Collection Time: 08/27/19  8:39 PM  Result Value Ref Range   WBC 3.1 (L) 4.0 - 10.5 K/uL   RBC 4.39 4.22 - 5.81 MIL/uL   Hemoglobin 14.1 13.0 - 17.0 g/dL   HCT 42.4  39.0 - 52.0 %   MCV 96.6 80.0 - 100.0 fL   MCH 32.1 26.0 - 34.0 pg   MCHC 33.3 30.0 - 36.0 g/dL   RDW 12.1 11.5 - 15.5 %   Platelets 240 150 - 400 K/uL   nRBC 0.0 0.0 - 0.2 %   Neutrophils Relative % 48 %   Neutro Abs 1.5 (L) 1.7 - 7.7 K/uL   Lymphocytes  Relative 39 %   Lymphs Abs 1.2 0.7 - 4.0 K/uL   Monocytes Relative 11 %   Monocytes Absolute 0.4 0.1 - 1.0 K/uL   Eosinophils Relative 1 %   Eosinophils Absolute 0.0 0.0 - 0.5 K/uL   Basophils Relative 1 %   Basophils Absolute 0.0 0.0 - 0.1 K/uL   Immature Granulocytes 0 %   Abs Immature Granulocytes 0.01 0.00 - 0.07 K/uL    Comment: Performed at [facility], [place] [address], [place], [place] [address]  Basic metabolic panel     Status: Abnormal   Collection Time: 08/27/19  8:39 PM  Result Value Ref Range   Sodium 135 135 - 145 mmol/L   Potassium 3.9 3.5 - 5.1 mmol/L   Chloride 103 98 - 111 mmol/L   CO2 18 (L) 22 - 32 mmol/L   Glucose, Bld 75 70 - 99 mg/dL   BUN 21 8 - 23 mg/dL   Creatinine, Ser 1.01 0.61 - 1.24 mg/dL   Calcium 9.6 8.9 - 10.3 mg/dL   GFR calc non Af Amer >60 >60 mL/min   GFR calc Af Amer >60 >60 mL/min   Anion gap 14 5 - 15    Comment: Performed at [facility], [place] [address], [place], [place] [address]  Troponin I (High Sensitivity)     Status: None   Collection Time: 08/27/19  8:39 PM  Result Value Ref Range   Troponin I (High Sensitivity) 3 <18 ng/L    Comment: (NOTE) Elevated high sensitivity troponin I (hsTnI) values and significant  changes across serial measurements may suggest ACS but many other  chronic and acute conditions are known to elevate hsTnI results.  Refer to the "Links" section for chest pain algorithms and additional  guidance. Performed at [facility], [place] [address], [place], [place] [address]   D-dimer, quantitative (not at [facility])     Status: None   Collection Time: 08/27/19  8:39 PM  Result Value Ref Range   D-Dimer, Quant 0.35 0.00 - 0.50 ug/mL-FEU    Comment: (NOTE) At the manufacturer cut-off of 0.50 ug/mL FEU, this assay has been documented to exclude PE with a sensitivity and negative predictive value of 97 to 99%.  At this time, this assay has not been approved by  the FDA to exclude DVT/VTE. Results should be correlated with clinical presentation. Performed at [facility], [place] [address] [place],  [address]   POC SARS Coronavirus 2 Ag-ED - Nasal Swab (BD Veritor Kit)     Status: Abnormal   Collection Time: 08/27/19  9:09 PM  Result Value Ref Range   SARS Coronavirus 2 Ag POSITIVE (A) NEGATIVE    Comment: (NOTE) SARS-CoV-2 antigen PRESENT. Positive results indicate the presence of viral antigens, but clinical correlation with patient history and other diagnostic information is necessary to determine patient infection status.  Positive results do not rule out bacterial infection or co-infection  with other viruses. False positive results are rare but can occur, and confirmatory RT-PCR testing may be  appropriate in some circumstances. The expected result is Negative. Fact Sheet for Patients: PodPark.tn Fact Sheet for Providers: GiftContent.is  This test is not yet approved or cleared by the [place] FDA and  has been authorized for detection and/or diagnosis of SARS-CoV-2 by FDA under an Emergency Use Authorization (EUA).  This EUA will remain in effect (meaning this test can be used) for the duration of  the COVID-19 declaration under Section 564(b)(1) of the Act, 21 U.S.C. section 360bbb-3(b)(1), unless the a uthorization is terminated or revoked sooner.   Troponin I (High Sensitivity)     Status: None   Collection Time: 08/27/19 10:02 PM  Result Value Ref Range   Troponin I (High Sensitivity) 3 <18 ng/L    Comment: (NOTE) Elevated high sensitivity troponin I (hsTnI) values and significant  changes across serial measurements may suggest ACS but many other  chronic and acute conditions are known to elevate hsTnI results.  Refer to the "Links" section for chest pain algorithms and additional  guidance. Performed at [facility], [place] [address] [place], [place] [address]        Psychiatric Specialty Exam: Physical Exam  Review of Systems  There were no vitals taken for this visit.There is no height or weight on file to calculate BMI.  General Appearance: NA  Eye Contact:  NA  Speech:  Slow and Difficulty talking due to coughing  Volume:  Decreased  Mood:  Anxious  Affect:  NA  Thought Process:  Descriptions of Associations: Intact  Orientation:  Full (Time, Place, and Person)  Thought Content:  No more hallucinations and paranoia.  Suicidal Thoughts:  No  Homicidal Thoughts:  No  Memory:  Immediate;   Fair Recent;   Fair Remote;   Good  Judgement:  Intact  Insight:  Present  Psychomotor Activity:  NA  Concentration:  Concentration: Fair and Attention Span: Fair  Recall:  Good  Fund of Knowledge:  Good  Language:  Fair  Akathisia:  No  Handed:  Right  AIMS (if indicated):     Assets:  Communication Skills Desire for Improvement Housing Resilience Social Support  ADL's:  Intact  Cognition:  WNL  Sleep:   fair      Assessment and Plan: Bipolar disorder type I.  PTSD.  Generalized anxiety disorder.  I reviewed blood work results from recent ER visit.  We have tried gabapentin but it make him sleepy.  He is out of Klonopin and having increased anxiety.  Like to go back on Xanax however I reminded that he wanted to switch from Xanax to Klonopin because Xanax did not work.  I explained that since he has been out of Klonopin and having Covid positive symptoms may be contributing to his anxiety.  He agree to restart Klonopin.  He is compliant with Lamictal, Abilify, Cogentin.  Recommended to discontinue gabapentin.  He is no longer taking Ambien which he overdosed in September 2020.  We will start Klonopin 0.5 mg 1 tablet daily and second needed for severe anxiety.  Continue Cogentin 0.5 mg at bedtime, Abilify 15 mg daily and Lamictal 200 mg daily.  He has no rash, itching, tremors or shakes.  Follow-up  in 6 weeks.  Encouraged to continue therapy with Janett Billow.  Follow Up Instructions:    I discussed the assessment and treatment plan with the patient. The patient was provided an opportunity to ask questions and all were answered. The patient agreed with the plan and demonstrated an  understanding of the instructions.   The patient was advised to call back or seek an in-person evaluation if the symptoms worsen or if the condition fails to improve as anticipated.  I provided 20 minutes of non-face-to-face time during this encounter.   Kathlee Nations, MD

## 2019-08-31 ENCOUNTER — Other Ambulatory Visit: Payer: Medicare Other

## 2019-09-02 ENCOUNTER — Telehealth: Payer: Self-pay | Admitting: Internal Medicine

## 2019-09-02 NOTE — Telephone Encounter (Signed)
Pt stated he tested positive for Covid 19 on 08/27/19 and is having body aches, pain and anxiety. He would like to know if Dr. Jenny Reichmann would write a rx to help with these things. Please advise  [place]

## 2019-09-03 NOTE — Telephone Encounter (Signed)
Pt returned call to [facility]. Pt states will keep phone beside him to hear the call.  Please call pt.

## 2019-09-03 NOTE — Telephone Encounter (Signed)
Ok to take tylenol for now, as hopefully less pain with lead to less nervous as well, thanks

## 2019-09-03 NOTE — Telephone Encounter (Signed)
Called pt back gave him MD recommendation on taking tylenol for now.Marland KitchenJohny [name]

## 2019-09-03 NOTE — Telephone Encounter (Signed)
Called pt back there was no answer LMOM w/MD response.Marland KitchenJohny Chess

## 2019-09-06 ENCOUNTER — Ambulatory Visit (INDEPENDENT_AMBULATORY_CARE_PROVIDER_SITE_OTHER): Payer: Medicare Other | Admitting: Internal Medicine

## 2019-09-06 DIAGNOSIS — F411 Generalized anxiety disorder: Secondary | ICD-10-CM

## 2019-09-06 DIAGNOSIS — R739 Hyperglycemia, unspecified: Secondary | ICD-10-CM

## 2019-09-06 DIAGNOSIS — I10 Essential (primary) hypertension: Secondary | ICD-10-CM

## 2019-09-06 MED ORDER — ALPRAZOLAM 0.5 MG PO TABS: 0.5000 mg | ORAL_TABLET | Freq: Two times a day (BID) | ORAL | 0 refills | Status: DC | PRN

## 2019-09-06 NOTE — Progress Notes (Signed)
Patient ID: Justin [name], male   DOB: [birth year], 74 y.o.   MRN: [id number]  Virtual Visit via Video Note  I connected with Justin [name] on 09/06/19 at 11:00 AM EST by a video enabled telemedicine application and verified that I am speaking with the correct person using two identifiers.  Location: Patient: at home Provider: at office   I discussed the limitations of evaluation and management by telemedicine and the availability of in person appointments. The patient expressed understanding and agreed to proceed.  History of Present Illness: Here to f/u;  Pt denies chest pain, increasing sob or doe, wheezing, orthopnea, PND, increased LE swelling, palpitations, dizziness or syncope.  Pt denies new neurological symptoms such as new headache, or facial or extremity weakness or numbness.  Pt denies polydipsia, polyuria.  Denies worsening depressive symptoms, suicidal ideation, but recurring panic in the past 2 days, psychiatrist not available and apparently no backup to contact; has ongoing anxiety, exponentially increased recently now that he is COVID +.  Pt states his xanax had been changed to klonopin for reason he did not understand, but not working and desparate for at least a small rx. Past Medical History:  Diagnosis Date  . Anxiety and depression   . Arthritis    knees, c-spine // gets lumbar ESI q 6 mos  . Bipolar disorder ([place])   . CAD (coronary artery disease)    [place] 08/2009 >> LHC ([facility]) - mLAD 70, Dx 65 >> PCI:  3x15 mm Endeavor DES to mLAD and 2.5x12 mm Endeavor DES to Dx // S/p NSTEMI >> LHC 8/12 ([facility]) - LM ok, LAD prox and mid 40; LAD stent ok, Dx stent ok, mRCA 30, mLCx 50 >> med Rx // ETT-Echo 2/17 ([facility]): Normal, EF 55-60 at rest  . Chicken pox   . 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 Grief   . History of echocardiogram    Echo 3/19: Mild concentric LVH, EF 60-65, normal wall motion, grade 1 diastolic dysfunction, mild AI, mildly dilated aortic root (40 mm),  MAC, mild LAE, atrial septal lipomatous hypertrophy  . History of non-ST elevation myocardial infarction (NSTEMI)   . History of nuclear stress test    Nuclear stress test 3/19: EF 57, inferior/inferoseptal/inferolateral defect consistent with probable soft tissue attenuation (cannot exclude subendocardial scar), no ischemia, intermediate risk >> Echo 3/19 normal EF, normal wall motion  . History of pneumonia 2011  . History of prostatitis 1990s  . Hx of adenomatous colonic polyps 06/22/2016  . Hyperlipidemia   . Hypertension   . Migraines    prior history  . PTSD (post-traumatic stress disorder)    Past Surgical History:  Procedure Laterality Date  . CERVICAL DISCECTOMY    . COLONOSCOPY  03/2017  . ELBOW SURGERY Left   . ELBOW SURGERY Right   . INGUINAL HERNIA REPAIR Bilateral    1996, 1997  . KNEE ARTHROSCOPY Right   . KNEE CARTILAGE SURGERY Left   . NASAL SEPTUM SURGERY    . STENT PLACEMENT VASCULAR ([facility])  09/02/2010  . TONSILLECTOMY    . TOTAL KNEE ARTHROPLASTY Left 08/31/2018   Procedure: TOTAL KNEE ARTHROPLASTY;  Surgeon: Dorna Leitz, MD;  Location: [facility];  Service: Orthopedics;  Laterality: Left;    reports that he has never smoked. He has never used smokeless tobacco. He reports that he does not drink alcohol or use drugs. family history includes Alzheimer's disease in his mother; Arthritis in his father and mother;  Depression in his brother; Diabetes in his sister and sister; Heart attack in his father, mother, and sister; Heart disease in his father and mother; Hyperlipidemia in his father; Hypertension in his father and mother; Lung cancer in his maternal aunt, paternal aunt, and paternal grandmother; Multiple sclerosis in his sister; Pulmonary embolism in his sister; Stroke in his father. Allergies  Allergen Reactions  . Ambien [Zolpidem Tartrate] Other (See Comments)    Blackout, memory issues  . Seroquel [Quetiapine]     SEVERE NIGHTMARES, SLEEPWALK AND NIGHT  DRIVE WITH NO RECOLLECTION UPON WAKENING   Current Outpatient Medications on File Prior to Visit  Medication Sig Dispense Refill  . ARIPiprazole (ABILIFY) 15 MG tablet Take 1 tablet (15 mg total) by mouth daily. 30 tablet 1  . benztropine (COGENTIN) 0.5 MG tablet Take 1 tablet (0.5 mg total) by mouth at bedtime. 30 tablet 1  . clonazePAM (KLONOPIN) 0.5 MG tablet Take one tab daily and 2nd as needed for severe anxiety 45 tablet 1  . clopidogrel (PLAVIX) 75 MG tablet TAKE 1 TABLET BY MOUTH DAILY 30 tablet 11  . diltiazem (CARTIA XT) 180 MG 24 hr capsule Take 1 capsule (180 mg total) by mouth daily. 30 capsule 11  . lamoTRIgine (LAMICTAL) 200 MG tablet Take 1 tablet (200 mg total) by mouth daily. For mood control 30 tablet 1  . nitroGLYCERIN (NITROSTAT) 0.4 MG SL tablet Place 1 tablet (0.4 mg total) under the tongue every 5 (five) minutes as needed for chest pain. 25 tablet 3  . rosuvastatin (CRESTOR) 20 MG tablet Take 1 tablet by mouth once daily 90 tablet 2   No current facility-administered medications on file prior to visit.    Observations/Objective: Alert, NAD, appropriate mood and affect, resps normal, cn 2-12 intact, moves all 4s, no visible rash or swelling Lab Results  Component Value Date   WBC 3.1 (L) 08/27/2019   HGB 14.1 08/27/2019   HCT 42.4 08/27/2019   PLT 240 08/27/2019   GLUCOSE 75 08/27/2019   CHOL 118 05/15/2019   TRIG 72.0 05/15/2019   HDL 50.80 05/15/2019   LDLCALC 53 05/15/2019   ALT 15 05/15/2019   AST 23 05/15/2019   NA 135 08/27/2019   K 3.9 08/27/2019   CL 103 08/27/2019   CREATININE 1.01 08/27/2019   BUN 21 08/27/2019   CO2 18 (L) 08/27/2019   TSH 0.77 05/15/2019   PSA 2.42 05/15/2019   INR 0.95 08/21/2018   HGBA1C 4.8 05/15/2019   Assessment and Plan: See notes  Follow Up Instructions: See notes   I discussed the assessment and treatment plan with the patient. The patient was provided an opportunity to ask questions and all were answered. The  patient agreed with the plan and demonstrated an understanding of the instructions.   The patient was advised to call back or seek an in-person evaluation if the symptoms worsen or if the condition fails to improve as anticipated.  Cathlean Cower, MD

## 2019-09-07 ENCOUNTER — Encounter: Payer: Self-pay | Admitting: Internal Medicine

## 2019-09-07 NOTE — Assessment & Plan Note (Signed)
stable overall by history and exam, recent data reviewed with pt, and pt to continue medical treatment as before,  to f/u any worsening symptoms or concerns le  

## 2019-09-07 NOTE — Patient Instructions (Signed)
Please take all new medication as prescribed 

## 2019-09-07 NOTE — Assessment & Plan Note (Signed)
Severe with panic, reviewed narcotic database, will give small limited rx xanax prn, and f/u psychiatry

## 2019-09-07 NOTE — Assessment & Plan Note (Signed)
Encouraged to continue to monitor BP at home

## 2019-09-12 ENCOUNTER — Encounter (HOSPITAL_COMMUNITY): Payer: Self-pay | Admitting: Licensed Clinical Social Worker

## 2019-09-12 ENCOUNTER — Other Ambulatory Visit: Payer: Self-pay

## 2019-09-12 ENCOUNTER — Ambulatory Visit (INDEPENDENT_AMBULATORY_CARE_PROVIDER_SITE_OTHER): Payer: Medicare Other | Admitting: Licensed Clinical Social Worker

## 2019-09-12 DIAGNOSIS — F3162 Bipolar disorder, current episode mixed, moderate: Secondary | ICD-10-CM

## 2019-09-12 NOTE — Progress Notes (Signed)
Virtual Visit via Telephone Note  I connected with Justin [name] on 09/12/19 at  2:30 PM EST by telephone and verified that I am speaking with the correct person using two identifiers.     I discussed the limitations, risks, security and privacy concerns of performing an evaluation and management service by telephone and the availability of in person appointments. I also discussed with the patient that there may be a patient responsible charge related to this service. The patient expressed understanding and agreed to proceed.  Type of Therapy: Individual Therapy  Treatment Goals addressed: improve psychiatric symptoms, Controlled Behavior, Improve Unhelpful Thought Patterns, Emotional Regulation Skills (Moderate moods, anger management, stress management)  Interventions: CBT, Motivational Interviewing, grounding techniques and mindfulness techniques, and psychoeducation  Summary: Justin [name] is a 73y.o. male who presents with Bipolar 1 Disorder, Mixed, Moderate  Suicidal/Homicidal: No - without intent/plan  Therapist Response:  Justin [name] met with clinician for an individual session. Justin [name] discussed his psychiatric symptoms, his current life events and his homework.He reports he has beenfeeling much better since his COVID sxs have improved. He reports ongoing high anxiety and frustration that he was not prescribed Xanax by Dr. Adele Schilder. Clinician reviewed the last note from Dr. Adele Schilder, who reported that the request was denied and Klonopin was given instead. Justin [name] processed his response to Klonopin, which makes him very tired and does not help as much. Clinician processed thoughts and feelings, explored options to speak with nurse or Dr. Adele Schilder again. Justin [name] reported that he did meet with his PCP, who provided a small prescription of Xanax.  Clinician explored current levels of anxiety, depression. Justin [name] reported high levels of loneliness, which makes it very difficult to not get depressed.  Justin [name] circled back to frustration about his memory loss when his wife died. Clinician again reviewed theory that the Dissociative Fugue became present under extreme emotional stress in order to protect him. Clinician explored other ways that Justin [name] could get closure on his wife's death. Clinician reflected that some pain does not go away, but it changes. Clinician encouraged Justin [name] to focus on strengths and positive experiences, rather than getting caught up in the depression.   Plan: Return again in 2-3weeks.   Diagnosis: Axis I: Bipolar 1 Disorder, Mixed, Moderate      I discussed the assessment and treatment plan with the patient. The patient was provided an opportunity to ask questions and all were answered. The patient agreed with the plan and demonstrated an understanding of the instructions.   The patient was advised to call back or seek an in-person evaluation if the symptoms worsen or if the condition fails to improve as anticipated.  I provided 45 minutes of non-face-to-face time during this encounter.   [name], LCSW

## 2019-09-25 ENCOUNTER — Ambulatory Visit: Payer: Medicare Other | Admitting: Neurology

## 2019-09-26 ENCOUNTER — Other Ambulatory Visit: Payer: Self-pay

## 2019-09-26 ENCOUNTER — Encounter (HOSPITAL_COMMUNITY): Payer: Self-pay | Admitting: Licensed Clinical Social Worker

## 2019-09-26 ENCOUNTER — Ambulatory Visit (INDEPENDENT_AMBULATORY_CARE_PROVIDER_SITE_OTHER): Payer: Medicare Other | Admitting: Licensed Clinical Social Worker

## 2019-09-26 DIAGNOSIS — F3162 Bipolar disorder, current episode mixed, moderate: Secondary | ICD-10-CM | POA: Diagnosis not present

## 2019-09-26 NOTE — Progress Notes (Signed)
Virtual Visit via Telephone Note  I connected with Justin [name] on 09/26/19 at 10:00 AM EST by telephone and verified that I am speaking with the correct person using two identifiers.    I discussed the limitations, risks, security and privacy concerns of performing an evaluation and management service by telephone and the availability of in person appointments. I also discussed with the patient that there may be a patient responsible charge related to this service. The patient expressed understanding and agreed to proceed.  Type of Therapy: Individual Therapy  Treatment Goals addressed: "I'm really worried about my sleep issues and how the are effecting my memory and my mind, having more hallucinations, feeling out of it". Justin [name] will improve psychological functioning as evidenced by reduced experience of psychoses, improvement in sleep, and more stabilized mood 3 out of 7 days.  Interventions: CBT, Motivational Interviewing, and psychoeducation  Summary: Justin [name] is a 74y.o. male who presents with Bipolar 1 Disorder, Mixed, Moderate  Suicidal/Homicidal: No - without intent/plan  Therapist Response:  Justin [name] met with clinician for an individual session. [place] discussed his psychiatric symptoms, his current life events and his homework.He reports that he actually had a good night's sleep last night. However, he also reports increased concerns about some odd thoughts and hallucinations. Justin [name] reports he has thought he heard someone breathing in the house, as well as thought he saw someone sitting in his wife's chair. He reported concern that he was inappropriate with clinician at last session, which did not happen, and that he had moments of confusion. Clinician utilized CBT to discuss triggers to these increasing psychoses and thought confusion. Clinician identified that the 4 year anniversary of his wife's hospitalization, death, and Justin [name]'s dissociative fugue was coming in the next  few weeks. Clinician validated that Justin [name] continues to be very aware of the calendar and historically has experienced increased anxiety and bipolar sxs around this time of year. Clinician discussed the importance of communicating with Dr. Adele Schilder about these concerns and agreed to make contact to report increased sxs for appointment on 2/16. Clinician and Justin [name] updated treatment plan to include sleep issues and increased psychoses.    Plan: Return again in 2-3weeks.  Diagnosis: Axis I: Bipolar 1 Disorder, Mixed, Moderate     I discussed the assessment and treatment plan with the patient. The patient was provided an opportunity to ask questions and all were answered. The patient agreed with the plan and demonstrated an understanding of the instructions.   The patient was advised to call back or seek an in-person evaluation if the symptoms worsen or if the condition fails to improve as anticipated.  I provided 45 minutes of non-face-to-face time during this encounter.   [name], LCSW

## 2019-10-07 ENCOUNTER — Other Ambulatory Visit (HOSPITAL_COMMUNITY): Payer: Self-pay | Admitting: Psychiatry

## 2019-10-07 DIAGNOSIS — F411 Generalized anxiety disorder: Secondary | ICD-10-CM

## 2019-10-08 ENCOUNTER — Telehealth: Payer: Self-pay | Admitting: Internal Medicine

## 2019-10-08 ENCOUNTER — Ambulatory Visit (INDEPENDENT_AMBULATORY_CARE_PROVIDER_SITE_OTHER): Payer: Medicare Other | Admitting: Psychiatry

## 2019-10-08 ENCOUNTER — Encounter (HOSPITAL_COMMUNITY): Payer: Self-pay | Admitting: Psychiatry

## 2019-10-08 ENCOUNTER — Other Ambulatory Visit: Payer: Self-pay

## 2019-10-08 DIAGNOSIS — F4310 Post-traumatic stress disorder, unspecified: Secondary | ICD-10-CM

## 2019-10-08 DIAGNOSIS — F3162 Bipolar disorder, current episode mixed, moderate: Secondary | ICD-10-CM

## 2019-10-08 DIAGNOSIS — F411 Generalized anxiety disorder: Secondary | ICD-10-CM | POA: Diagnosis not present

## 2019-10-08 MED ORDER — ARIPIPRAZOLE 20 MG PO TABS: 20.0000 mg | ORAL_TABLET | Freq: Every day | ORAL | 1 refills | Status: DC

## 2019-10-08 MED ORDER — CLONAZEPAM 0.5 MG PO TABS: ORAL_TABLET | ORAL | 1 refills | Status: DC

## 2019-10-08 MED ORDER — BENZTROPINE MESYLATE 0.5 MG PO TABS: 0.5000 mg | ORAL_TABLET | Freq: Two times a day (BID) | ORAL | 1 refills | Status: DC

## 2019-10-08 MED ORDER — LAMOTRIGINE 200 MG PO TABS: 200.0000 mg | ORAL_TABLET | Freq: Every day | ORAL | 1 refills | Status: DC

## 2019-10-08 NOTE — Progress Notes (Signed)
Virtual Visit via Telephone Note  I connected with Justin [name] on 10/08/19 at  9:40 AM EST by telephone and verified that I am speaking with the correct person using two identifiers.   I discussed the limitations, risks, security and privacy concerns of performing an evaluation and management service by telephone and the availability of in person appointments. I also discussed with the patient that there may be a patient responsible charge related to this service. The patient expressed understanding and agreed to proceed.   History of Present Illness: Patient was evaluated by phone session.  He is slowly and gradually recovering from Covid symptoms and his breathing is improved.  However he is still struggling with anxiety and irritability.  Patient reported that he ran out from [place] because there are days when he was taking 3 a day.  He also taking Xanax from PCP.  Though he feels his depression mood swings and anger is much better but he reported a lot of anxiety and irritability.  He still feels paranoia and not comfortable around people but denies any hallucination.  He sleeps some nights only few hours and still have nightmares and flashback.  He is in therapy with Janett Billow.  Denies any suicidal thoughts.  His relationship with his son is okay.  He wish it can be improved.  He has no tremors shakes or any EPS.  He is compliant with Abilify, Lamictal, Cogentin.  He has no rash or any itching.   Past Psychiatric History:Reviewed. H/O suicidal attemptby taking overdose on Ambien twice, hydroxyzine and alcohol. H/Obipolar disorder with multipleinpatient. DidIOP. LastER visit Sept 2020 and inpatient[facility].Tried Zyprexa, Cymbalta, Seroquel, Vistaril, Neurontin,Wellbutrin and triptal.Tried low-dose doxepin and trazodone. Had a reaction with Ativan and pain medication. SawDr. Letta Moynahan inpast.  Recent Results (from the past 2160 hour(s))  SARS Coronavirus 2 (TAT  6-24 hrs)     Status: None   Collection Time: 07/17/19 12:00 AM  Result Value Ref Range   SARS Coronavirus 2 RESULT:  NEGATIVE     Comment: RESULT:  NEGATIVESARS-CoV-2 INTERPRETATION:A NEGATIVE  test result means that SARS-CoV-2 RNA was not present in the specimen above the limit of detection of this test. This does not preclude a possible SARS-CoV-2 infection and should not be used as the  sole basis for patient management decisions. Negative results must be combined with clinical observations, patient history, and epidemiological information. Optimum specimen types and timing for peak viral levels during infections caused by SARS-CoV-2  have not been determined. Collection of multiple specimens or types of specimens may be necessary to detect virus. Improper specimen collection and handling, sequence variability under primers/probes, or organism present below the limit of detection may  lead to false negative results. Positive and negative predictive values of testing are highly dependent on prevalence. False negative test results are more likely when prevalence of disease is high.The expected result is NEGATIVE.Fact  Sheet for  Healthcare Providers: https://www.woods-mathews.com/.Fact Sheet for Patients: SugarRoll.be.Normal Reference Range - Negative   CBC with Differential     Status: Abnormal   Collection Time: 08/27/19  8:39 PM  Result Value Ref Range   WBC 3.1 (L) 4.0 - 10.5 K/uL   RBC 4.39 4.22 - 5.81 MIL/uL   Hemoglobin 14.1 13.0 - 17.0 g/dL   HCT 42.4 39.0 - 52.0 %   MCV 96.6 80.0 - 100.0 fL   MCH 32.1 26.0 - 34.0 pg   MCHC 33.3 30.0 - 36.0 g/dL   RDW 12.1 11.5 -  15.5 %   Platelets 240 150 - 400 K/uL   nRBC 0.0 0.0 - 0.2 %   Neutrophils Relative % 48 %   Neutro Abs 1.5 (L) 1.7 - 7.7 K/uL   Lymphocytes Relative 39 %   Lymphs Abs 1.2 0.7 - 4.0 K/uL   Monocytes Relative 11 %   Monocytes Absolute 0.4 0.1 - 1.0 K/uL   Eosinophils Relative 1 %    Eosinophils Absolute 0.0 0.0 - 0.5 K/uL   Basophils Relative 1 %   Basophils Absolute 0.0 0.0 - 0.1 K/uL   Immature Granulocytes 0 %   Abs Immature Granulocytes 0.01 0.00 - 0.07 K/uL    Comment: Performed at [facility], [place] [address] [place], [place] [address]  Basic metabolic panel     Status: Abnormal   Collection Time: 08/27/19  8:39 PM  Result Value Ref Range   Sodium 135 135 - 145 mmol/L   Potassium 3.9 3.5 - 5.1 mmol/L   Chloride 103 98 - 111 mmol/L   CO2 18 (L) 22 - 32 mmol/L   Glucose, Bld 75 70 - 99 mg/dL   BUN 21 8 - 23 mg/dL   Creatinine, Ser 1.01 0.61 - 1.24 mg/dL   Calcium 9.6 8.9 - 10.3 mg/dL   GFR calc non Af Amer >60 >60 mL/min   GFR calc Af Amer >60 >60 mL/min   Anion gap 14 5 - 15    Comment: Performed at [facility], [place] [address] [place], [place] [address]  Troponin I (High Sensitivity)     Status: None   Collection Time: 08/27/19  8:39 PM  Result Value Ref Range   Troponin I (High Sensitivity) 3 <18 ng/L    Comment: (NOTE) Elevated high sensitivity troponin I (hsTnI) values and significant  changes across serial measurements may suggest ACS but many other  chronic and acute conditions are known to elevate hsTnI results.  Refer to the "Links" section for chest pain algorithms and additional  guidance. Performed at [facility], [place] [address] [place], [place] [address]   D-dimer, quantitative (not at [facility])     Status: None   Collection Time: 08/27/19  8:39 PM  Result Value Ref Range   D-Dimer, Quant 0.35 0.00 - 0.50 ug/mL-FEU    Comment: (NOTE) At the manufacturer cut-off of 0.50 ug/mL FEU, this assay has been documented to exclude PE with a sensitivity and negative predictive value of 97 to 99%.  At this time, this assay has not been approved by the FDA to exclude DVT/VTE. Results should be correlated with clinical presentation. Performed at [facility], [place]  [address] [place], [place] [address]   POC SARS Coronavirus 2 Ag-ED - Nasal Swab (BD Veritor Kit)     Status: Abnormal   Collection Time: 08/27/19  9:09 PM  Result Value Ref Range   SARS Coronavirus 2 Ag POSITIVE (A) NEGATIVE    Comment: (NOTE) SARS-CoV-2 antigen PRESENT. Positive results indicate the presence of viral antigens, but clinical correlation with patient history and other diagnostic information is necessary to determine patient infection status.  Positive results do not rule out bacterial infection or co-infection  with other viruses. False positive results are rare but can occur, and confirmatory RT-PCR testing may be appropriate in some circumstances. The expected result is Negative. Fact Sheet for Patients: PodPark.tn Fact Sheet for Providers: GiftContent.is  This test is not yet approved or cleared by the [place] and  has been authorized for detection and/or diagnosis of SARS-CoV-2 by FDA under an Emergency Use Authorization (EUA).  This EUA will remain in effect (meaning this test can be used) for the duration of  the COVID-19 declaration under Section 564(b)(1) of the Act, 21 U.S.C. section 360bbb-3(b)(1), unless the a uthorization is terminated or revoked sooner.   Troponin I (High Sensitivity)     Status: None   Collection Time: 08/27/19 10:02 PM  Result Value Ref Range   Troponin I (High Sensitivity) 3 <18 ng/L    Comment: (NOTE) Elevated high sensitivity troponin I (hsTnI) values and significant  changes across serial measurements may suggest ACS but many other  chronic and acute conditions are known to elevate hsTnI results.  Refer to the "Links" section for chest pain algorithms and additional  guidance. Performed at [facility], [place] [address] [place], [place] [address]        Psychiatric Specialty Exam: Physical Exam  Review of Systems  There were no vitals  taken for this visit.There is no height or weight on file to calculate BMI.  General Appearance: NA  Eye Contact:  NA  Speech:  Normal Rate  Volume:  Decreased  Mood:  Anxious and Dysphoric  Affect:  NA  Thought Process:  Descriptions of Associations: Intact  Orientation:  Full (Time, Place, and Person)  Thought Content:  Paranoid Ideation  Suicidal Thoughts:  No  Homicidal Thoughts:  No  Memory:  Immediate;   Fair Recent;   Good Remote;   Good  Judgement:  Fair  Insight:  Fair  Psychomotor Activity:  NA  Concentration:  Concentration: Fair and Attention Span: Fair  Recall:  Good  Fund of Knowledge:  Good  Language:  Good  Akathisia:  No  Handed:  Right  AIMS (if indicated):     Assets:  Communication Skills Desire for Improvement Housing Resilience  ADL's:  Intact  Cognition:  WNL  Sleep:   fair      Assessment and Plan: Bipolar disorder type I.  PTSD.  Generalized anxiety disorder.  I reviewed records from PCP and blood work from ER in first week of January.  He is slowly resolving from Covid symptoms..  I recommend not to take Xanax from PCP since we already provided Klonopin.  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  I explained he should take the Klonopin as prescribed and not to overuse.  He promised that he will not get the Xanax from PCP and I agreed to increase Klonopin to take twice a day.  I also increase Abilify to 20 mg to help his residual mood lability and paranoia.  Continue Lamictal 200 mg daily and Cogentin 0.5 mg twice a day.  Encouraged to continue therapy with Janett Billow.  I will forward my note to his PCP about his benzodiazepine use.  Recommended to call us back if he has any question or any concern.  Follow-up in 6 weeks.  Follow Up Instructions:    I discussed the assessment and treatment plan with the patient. The patient was provided an opportunity to ask questions and all were answered. The patient agreed with the plan and demonstrated an understanding  of the instructions.   The patient was advised to call back or seek an in-person evaluation if the symptoms worsen or if the condition fails to improve as anticipated.  I provided 20 minutes of non-face-to-face time during this encounter.   Kathlee Nations, MD

## 2019-10-08 NOTE — Telephone Encounter (Signed)
    Pt c/o medication issue:   1. Name of Medication: ALPRAZolam   2. How are you currently taking this medication (dosage and times per day)? As written  3. Are you having a reaction (difficulty breathing--STAT)? no  4. What is your medication issue? Pharmacy calling to clarify with  Dr Jenny Reichmann if  patient should be taking Xanax and clonazePAM [place]bye [place])

## 2019-10-09 NOTE — Telephone Encounter (Signed)
I would have to say no to xanax since he just was rx klonopin

## 2019-10-09 NOTE — Telephone Encounter (Signed)
Called patient explained below message.  Patient voiced understanding.

## 2019-10-10 ENCOUNTER — Other Ambulatory Visit: Payer: Self-pay

## 2019-10-10 ENCOUNTER — Ambulatory Visit (INDEPENDENT_AMBULATORY_CARE_PROVIDER_SITE_OTHER): Payer: Medicare Other | Admitting: Licensed Clinical Social Worker

## 2019-10-10 ENCOUNTER — Encounter (HOSPITAL_COMMUNITY): Payer: Self-pay | Admitting: Licensed Clinical Social Worker

## 2019-10-10 DIAGNOSIS — F3162 Bipolar disorder, current episode mixed, moderate: Secondary | ICD-10-CM | POA: Diagnosis not present

## 2019-10-10 NOTE — Progress Notes (Signed)
Virtual Visit via Telephone Note  I connected with Justin [name] on 10/10/19 at  3:30 PM EST by telephone and verified that I am speaking with the correct person using two identifiers.    I discussed the limitations, risks, security and privacy concerns of performing an evaluation and management service by telephone and the availability of in person appointments. I also discussed with the patient that there may be a patient responsible charge related to this service. The patient expressed understanding and agreed to proceed.  Type of Therapy: Individual Therapy  Treatment Goals addressed: "I'm really worried about my sleep issues and how the are effecting my memory and my mind, having more hallucinations, feeling out of it". Justin [name] will improve psychological functioning as evidenced by reduced experience of psychoses, improvement in sleep, and more stabilized mood 3 out of 7 days.  Interventions: CBT, Motivational Interviewing, and psychoeducation  Summary: Justin [name] is a 74y.o. male who presents with Bipolar 1 Disorder, Mixed, Moderate  Suicidal/Homicidal: No - without intent/plan  Therapist Response:  Justin [name] met with clinician for an individual session. Justin [name] discussed his psychiatric symptoms, his current life events and his homework.He reports that he continues to have mood lability, but it hopeful that he will feel better once new medication dose kicks in. Clinician explored current emotional state, interactions, and thought processes using CBT. Clinician provided time and space for Justin [name] to process his disappointment and sadness about his poor relationship with children and grandchildren. Clinician utilized CBT reality testing about what his children are dealing with and his expectations of them. Clinician noted that due to him looking to them for all of his emotional support, he is getting disappointed that they are not meeting his needs. Clinician again revisited the idea of  getting a dog to give him a companion, a hobby, and a purpose in life. Clinician also explored other activities that Justin [name] could engage in, such as church or volunteering. Justin [name] requested consultation on why his moods flare up and what causes the bipolar mood swings. Clinician provided psychoeducation in laymen's terms to assist Justin [name] in understanding, as well as to provide some context about the genetic aspect of Bipolar D/O.    Plan: Return again in 2-3weeks.  Diagnosis: Axis I: Bipolar 1 Disorder, Mixed, Moderate      I discussed the assessment and treatment plan with the patient. The patient was provided an opportunity to ask questions and all were answered. The patient agreed with the plan and demonstrated an understanding of the instructions.   The patient was advised to call back or seek an in-person evaluation if the symptoms worsen or if the condition fails to improve as anticipated.  I provided 60 minutes of non-face-to-face time during this encounter.   [name], LCSW

## 2019-10-15 ENCOUNTER — Encounter: Payer: Self-pay | Admitting: Neurology

## 2019-10-15 ENCOUNTER — Ambulatory Visit: Payer: Medicare Other | Admitting: Neurology

## 2019-10-15 ENCOUNTER — Other Ambulatory Visit: Payer: Self-pay

## 2019-10-15 VITALS — Temp 97.8°F | Ht 72.0 in | Wt 170.0 lb

## 2019-10-15 DIAGNOSIS — R251 Tremor, unspecified: Secondary | ICD-10-CM | POA: Diagnosis not present

## 2019-10-15 DIAGNOSIS — R4189 Other symptoms and signs involving cognitive functions and awareness: Secondary | ICD-10-CM | POA: Diagnosis not present

## 2019-10-15 DIAGNOSIS — E229 Hyperfunction of pituitary gland, unspecified: Secondary | ICD-10-CM

## 2019-10-15 DIAGNOSIS — F0390 Unspecified dementia without behavioral disturbance: Secondary | ICD-10-CM

## 2019-10-15 DIAGNOSIS — R413 Other amnesia: Secondary | ICD-10-CM | POA: Diagnosis not present

## 2019-10-15 DIAGNOSIS — H5509 Other forms of nystagmus: Secondary | ICD-10-CM

## 2019-10-15 DIAGNOSIS — E440 Moderate protein-calorie malnutrition: Secondary | ICD-10-CM

## 2019-10-15 DIAGNOSIS — F13239 Sedative, hypnotic or anxiolytic dependence with withdrawal, unspecified: Secondary | ICD-10-CM

## 2019-10-15 DIAGNOSIS — I25119 Atherosclerotic heart disease of native coronary artery with unspecified angina pectoris: Secondary | ICD-10-CM

## 2019-10-15 NOTE — Progress Notes (Addendum)
Provider:  Larey Seat, MD  Primary Care Physician:  Biagio Borg, MD [place] [address]     Referring Provider: Biagio Borg, [place],  [place] [address]          Chief Complaint according to patient   Patient presents with:    . New Patient (Initial Visit)     " pt here  alone, rm 10. pt states that he started noticing memory concerns that when he lost his wife in 10-17-2015. he was seeing a therapist and they were thinking it was PTSD was related to loss of his wife. he has also noticing in present forgetfulness of turning stove and iron off. There was banana's on counter and he doesnt like banana's, he has no idea where they come from. his son has been worried because there are times he will go to his house and have conversations with him and then when he goes home"      Other He cant remember what he and Dr Jenny Reichmann have discussed.       None  Other Visits in Neurology/  Gerontology,/ Geriatric psychiatry?    None           HISTORY OF PRESENT ILLNESS:  Justin [name] is a 74 y.o. year old White or Caucasian male patient seen here upon a referral on 10/15/2019 from PCP for a cognitive disorder. .  Chief concern according to patient :  see above _ RN notes. 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 Knee replacement in 1- 10/16/2018 Dr Percell Miller.    Family  medical history: maternal grandfather had Alzheimer's disease in Oct 16, 1957-" harding of the arteries". Dementia affected his mother, to a point where his mother did not recognize him, and called him edgar after her baby-brother. He has a family history that is positive for Alzheimer's disease in mother and maternal grandfather.  Her brother suffered from depression.  Sister suffers from diabetes.  Father had a heart attack 2.  Hypertension in both parents.  A sister with multiple sclerosis.  Stroke in his father.  Brother died of gun shot, suicide.      Social history: Patient is retired from [employer] after 21 years and lives in a household  Alone since his wife passed away 10/17/15.  He has one adult son who initiated the memory work up.  Pets are not present. His dog died Oct 16, 2016. Tobacco use; never. ETOH use ; used to drink on Friday nights after work, but not since his wife died. Caffeine intake in form of Coffee( 2 day) Soda( 1 a day) Tea ( rarely) or energy drinks. Regular exercise- none since his dog died.    His wife had COPD and died of pneumonia in hospital after surgery.   Sleep habits are as follows: The patient's dinner time is irregular - he may forget-  The patient goes to bed  at variable  times, he may go to bed at 3 A  and may wake up at 6 AM. He had sleep walking on Ambien 10 years ago- and was given Ambien again last year 05-20-2019 hand was in delirium- wandered, moved furniture, had amnesia-   He continues to be desperate for sleep.  He reports not feeling refreshed or restored in AM, with symptoms such as confusion.      Review of Systems: Out of a complete 14 system review, the patient complains of only the following symptoms, and all other reviewed systems are negative.:   Amnestic spells, PTSD, therapy -  Confusion, cognitive decline.    MMSE - Mini Mental State Exam 10/15/2019 03/26/2015  Orientation to time 5 5  Orientation to Place 5 5  Registration 3 3  Attention/  Calculation 5 5  Recall 1 3  Language- name 2 objects 2 2  Language- repeat 0 1  Language- follow 3 step command 3 3  Language- read & follow direction 0 1  Write a sentence 1 1  Copy design 1 1  Total score 26 30    No flowsheet data found.: MOCA was attempted , he failed trail making test and drawing.    Severely depressed GDS at 10 out of 15 points. 10-15-2019, CD  Social History   Socioeconomic History  . Marital status: Widowed    Spouse name: Not on file  . Number of children: 2  . Years of education: 8  . Highest education level: Not on file  Occupational History  . Occupation: Retired  Tobacco Use  . Smoking status: Never Smoker  . Smokeless tobacco: Never Used  Substance and Sexual Activity  . Alcohol use: No    Alcohol/week: 0.0 standard drinks  . Drug use: No  . Sexual activity: Not Currently  Other Topics Concern  . Not on file  Social History Narrative   Retired, widowed in 2017    1 son / 1 daughter   2 caffeinated beverages daily no alcohol or tobacco   Fun: Work out in the yard.   Denies religious beliefs effecting healthcare.    Worked at [employer] for 30 years   Social Determinants of Radio broadcast assistant Strain:   . Difficulty of Paying Living Expenses: Not on file  Food Insecurity:   . Worried About Charity fundraiser in the Last Year: Not on file  . Ran Out of Food in the Last Year: Not on file  Transportation Needs:   . Lack of Transportation (Medical): Not on file  . Lack of Transportation (Non-Medical): Not on file  Physical Activity:   . Days of Exercise per Week: Not on file  . Minutes of Exercise per Session: Not on file  Stress:   . Feeling of Stress : Not on file  Social Connections:   . Frequency of Communication with Friends and Family: Not on file  . Frequency of Social Gatherings with Friends and Family: Not on file  . Attends Religious Services: Not on file  . Active Member of Clubs or Organizations: Not on file    . Attends Archivist Meetings: Not on file  . Marital Status: Not on file    Family History  Problem Relation Age of Onset  . Arthritis Mother   . Heart disease Mother        s/p CABG  . Hypertension Mother   . Alzheimer's disease Mother   . Heart attack Mother   .  Arthritis Father   . Hyperlipidemia Father   . Heart disease Father        s/p CABG  . Stroke Father   . Hypertension Father   . Heart attack Father   . Multiple sclerosis Sister   . Diabetes Sister   . Lung cancer Paternal Grandmother   . Diabetes Sister   . Heart attack Sister   . Pulmonary embolism Sister        died  . Lung cancer Maternal Aunt   . Lung cancer Paternal Aunt   . Depression Brother        suicide    Past Medical History:  Diagnosis Date  . Anxiety and depression   . Arthritis    knees, c-spine // gets lumbar ESI q 6 mos  . Bipolar disorder ([place])   . CAD (coronary artery disease)    [place] 08/2009 >> LHC ([facility]) - mLAD 70, Dx 65 >> PCI:  3x15 mm Endeavor DES to mLAD and 2.5x12 mm Endeavor DES to Dx // S/p NSTEMI >> LHC 8/12 ([facility]) - LM ok, LAD prox and mid 40; LAD stent ok, Dx stent ok, mRCA 30, mLCx 50 >> med Rx // ETT-Echo 2/17 ([facility]): Normal, EF 55-60 at rest  . Chicken pox   . 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 Grief   . History of echocardiogram    Echo 3/19: Mild concentric LVH, EF 60-65, normal wall motion, grade 1 diastolic dysfunction, mild AI, mildly dilated aortic root (40 mm), MAC, mild LAE, atrial septal lipomatous hypertrophy  . History of non-ST elevation myocardial infarction (NSTEMI)   . History of nuclear stress test    Nuclear stress test 3/19: EF 57, inferior/inferoseptal/inferolateral defect consistent with probable soft tissue attenuation (cannot exclude subendocardial scar), no ischemia, intermediate risk >> Echo 3/19 normal EF, normal wall motion  . History of pneumonia 2011  . History of prostatitis 1990s  . Hx of adenomatous colonic polyps  06/22/2016  . Hyperlipidemia   . Hypertension   . Migraines    prior history  . PTSD (post-traumatic stress disorder)     Past Surgical History:  Procedure Laterality Date  . CERVICAL DISCECTOMY    . COLONOSCOPY  03/2017  . ELBOW SURGERY Left   . ELBOW SURGERY Right   . INGUINAL HERNIA REPAIR Bilateral    1996, 1997  . KNEE ARTHROSCOPY Right   . KNEE CARTILAGE SURGERY Left   . NASAL SEPTUM SURGERY    . STENT PLACEMENT VASCULAR ([facility])  09/02/2010  . TONSILLECTOMY    . TOTAL KNEE ARTHROPLASTY Left 08/31/2018   Procedure: TOTAL KNEE ARTHROPLASTY;  Surgeon: Dorna Leitz, MD;  Location: [facility];  Service: Orthopedics;  Laterality: Left;     Current Outpatient Medications on File Prior to Visit  Medication Sig Dispense Refill  . ARIPiprazole (ABILIFY) 20 MG tablet Take 1 tablet (20 mg total) by mouth daily. 30 tablet 1  . benztropine (COGENTIN) 0.5 MG tablet Take 1 tablet (0.5 mg total) by mouth 2 (two) times daily. 60 tablet 1  . clonazePAM (KLONOPIN) 0.5 MG tablet Take one tab daily and 2nd as needed for severe anxiety 45 tablet 1  . clopidogrel (PLAVIX) 75 MG tablet TAKE 1 TABLET BY MOUTH DAILY 30 tablet 11  . diltiazem (CARTIA XT) 180 MG 24 hr capsule Take 1 capsule (180 mg total) by mouth daily. 30 capsule 11  . lamoTRIgine (LAMICTAL) 200 MG tablet Take 1 tablet (200 mg total) by  mouth daily. For mood control 30 tablet 1  . nitroGLYCERIN (NITROSTAT) 0.4 MG SL tablet Place 1 tablet (0.4 mg total) under the tongue every 5 (five) minutes as needed for chest pain. 25 tablet 3  . rosuvastatin (CRESTOR) 20 MG tablet Take 1 tablet by mouth once daily 90 tablet 2   No current facility-administered medications on file prior to visit.    Allergies  Allergen Reactions  . Ambien [Zolpidem Tartrate] Other (See Comments)    Blackout, memory issues  . Seroquel [Quetiapine]     SEVERE NIGHTMARES, SLEEPWALK AND NIGHT DRIVE WITH NO RECOLLECTION UPON WAKENING    Physical exam:  Today's  Vitals   10/15/19 1428  Temp: 97.8 F (36.6 C)  Weight: 170 lb (77.1 kg)  Height: 6' (1.829 m)   Body mass index is 23.06 kg/m.   Wt Readings from Last 3 Encounters:  10/15/19 170 lb (77.1 kg)  08/27/19 160 lb (72.6 kg)  08/08/19 168 lb (76.2 kg)     Ht Readings from Last 3 Encounters:  10/15/19 6' (1.829 m)  08/27/19 5' 10.5" (1.791 m)  08/08/19 _0  (1.778 m)      General: The patient is awake, alert and appears not in acute distress.  The patient is well groomed. Head: Normocephalic, atraumatic.  Neck is supple. . Nasal airflow patent.  Retrognathia is not seen.   Cardiovascular:  Regular rate and cardiac rhythm by pulse,  without distended neck veins. Respiratory: Lungs are clear to auscultation.  Skin:  Without evidence of ankle edema, or rash. Trunk: The patient's posture is erect.   Neurologic exam : The patient is awake and alert, oriented to place and time.   Memory subjective described as intact.  Attention span & concentration ability appears impaired.Marland Kitchen Speech is fluent,  without  dysarthria, but with dysphonia - not  aphasia.  Mood and affect are depressed, worried, restless-    Cranial nerves: no loss of smell or taste reported  Pupils are equal and briskly reactive to light. Funduscopic exam deferred.   Extraocular movements in  horizontal planes were with a coarse nystagmus in both directions.  No Diplopia. Visual fields by finger perimetry are intact. Accomodation intact.  Right sided Hearing impaired - left side was intact to soft voice and finger rubbing.    Facial sensation intact to fine touch.  Facial motor strength is symmetric and tongue and uvula move midline.  Neck ROM : rotation, tilt and flexion extension were normal for age and shoulder shrug was symmetrical.    Motor exam:  Symmetric bulk, tone and ROM.  he has trouble with left hip flexion.  Normal tone without cog wheeling, symmetric grip strength . Sensory:  Fine touch, pinprick and  vibration were normal.  Proprioception tested in the upper extremities was normal. Coordination: Rapid alternating movements in the fingers/hands were of normal speed.  The Finger-to-nose maneuver with evidence of ataxia, dysmetria and a coarse tremor.   Gait and station: Patient could rise unassisted from a seated position, walked without assistive device.  Stance is of normal width/ base and the patient turned with 3 steps..  Toe and heel walk were deferred.  Deep tendon reflexes: in the  upper and lower extremities are symmetric and brisk.  Babinski response was deferred .   Justin [name] presents today as a new patient today on October 15, 2019.  He was seen by his primary care physician on 17 December after his son insisted on an evaluation for  worsening memory especially short-term memory.  He has forgotten to switch of the stove burners on the computer, he has left the current thyroid on.  He has had several visits from his son that he cannot recall.  This seems to be quite significant cognitive impairment with amnesia.  He has never been a heavy drinker and had none in the last 6 months.  He has been followed by counselor for PTSD since the death of his wife.  Has history of coronary artery disease but following up but never smoked.  Has a past history of migraine.  He had a vascular stent placement coronary artery disease in August 22, 2010 9 years ago, had a total knee arthroscopy and arthroplasty on August 31, 2018.  Has had elbow surgeries in the 1990s.    Please note that the patient has responded to [place] twice  with amnestic events, performing complex behaviors that he cannot recall.  Looking at his current medication list and his follow-up psychotropic medication Cogentin, Abilify, Plavix, Lamictal but he is also on Crestor Nitrostat and cardia-diltiazem.  Blood tests last obtained in September 2020 showed an elevated glucose level but I am not sure that the level was obtained  fasting.  Sodium was low at 130 potassium was low at 3.4 chloride low at 97 BUN very low at 5.  Creatinine was normal.  HbA1c 4.8 TSH 0.77.   A Mini-Mental status was asked obtained in his primary care physician's office at the time he scored 30 out of 30 today he scored 27 out of 30.  He had a brain MRI that was performed without contrast and compared to a head CT from 2011 which showed brain atrophy that was generalized and considered not age appropriate and appropriate.        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  Possible overlap with medication. Possible overlap with bipolar  Condition and benzodiazepine use.  2) He has severe tremor and eye movements are abnormal.   4)  Patient needs hep around the home, regular meals.  I doubt that he eats nutritious.  Pizza, frozen foods.    My Plan is to proceed with: Needs contrast study MRI , review of the brain stem and basal gangliae.   Referral for detailed memory testing-  Including neuropsychology, labs for Protein serum electrophoresis, B 12 , And have this patient take a daily multivitamin.!daily regular call for check up with family members.  Driving seems very unsafe given his trail making test failure. I am also concerned that no family member accompanied him here.      RV with MOCA testing, orthostatic BP.     In short, Justin [name] is presenting with cyclic hypersomnia and insomnia, dementia with amnestic spells. , a symptom that can be attributed to cognitive decline in the setting of longstanding bipolar disorder.  I plan to follow up either personally or through our NP within 3  month.    CC: I will share my notes with PCP.  Electronically signed by: Larey Seat, MD 10/15/2019 2:39 PM  [facility] Sleep Board certified by The [employer] of Sleep Medicine  and Diplomate of the [employer] of Sleep Medicine. Board certified In Neurology through the [place], Fellow of the [employer] of Neurology. Medical Director of [employer].

## 2019-10-15 NOTE — Patient Instructions (Signed)

## 2019-10-15 NOTE — Addendum Note (Signed)
Addended by: Larey Seat on: 10/15/2019 03:32 PM   Modules accepted: Orders

## 2019-10-16 NOTE — Progress Notes (Signed)
Normal iton level, all others pending.

## 2019-10-18 NOTE — Progress Notes (Signed)
No abnormality in protein E-phoresis noted, normal iron levels.

## 2019-10-19 LAB — PROTEIN ELECTROPHORESIS, SERUM
A/G Ratio: 1.8 — ABNORMAL HIGH (ref 0.7–1.7)
Albumin ELP: 4.1 g/dL (ref 2.9–4.4)
Alpha 1: 0.2 g/dL (ref 0.0–0.4)
Alpha 2: 0.5 g/dL (ref 0.4–1.0)
Beta: 0.8 g/dL (ref 0.7–1.3)
Gamma Globulin: 0.8 g/dL (ref 0.4–1.8)
Globulin, Total: 2.3 g/dL (ref 2.2–3.9)
Total Protein: 6.4 g/dL (ref 6.0–8.5)

## 2019-10-19 LAB — CERULOPLASMIN: Ceruloplasmin: 16.4 mg/dL (ref 16.0–31.0)

## 2019-10-19 LAB — IRON AND TIBC
Iron Saturation: 36 % (ref 15–55)
Iron: 112 ug/dL (ref 38–169)
Total Iron Binding Capacity: 308 ug/dL (ref 250–450)
UIBC: 196 ug/dL (ref 111–343)

## 2019-10-19 LAB — METHYLMALONIC ACID, SERUM: Methylmalonic Acid: 79 nmol/L (ref 0–378)

## 2019-10-19 LAB — VITAMIN B1: Thiamine: 105 nmol/L (ref 66.5–200.0)

## 2019-10-23 ENCOUNTER — Telehealth: Payer: Self-pay | Admitting: Neurology

## 2019-10-23 NOTE — Telephone Encounter (Signed)
Called the patient and reviewed the lab results with him advised that they were all in a normal range so far and there has been nothing of concern at this time. Advised the patient I would call with MRI results once I get them. Patient was appreciative for the call

## 2019-10-23 NOTE — Telephone Encounter (Signed)
-----   Message from Larey Seat, MD sent at 10/16/2019  2:27 PM EST ----- Normal iton level, all others pending.

## 2019-10-24 ENCOUNTER — Other Ambulatory Visit: Payer: Self-pay

## 2019-10-24 ENCOUNTER — Ambulatory Visit: Payer: Medicare Other | Attending: Internal Medicine

## 2019-10-24 ENCOUNTER — Ambulatory Visit (INDEPENDENT_AMBULATORY_CARE_PROVIDER_SITE_OTHER): Payer: Medicare Other | Admitting: Licensed Clinical Social Worker

## 2019-10-24 ENCOUNTER — Encounter (HOSPITAL_COMMUNITY): Payer: Self-pay | Admitting: Licensed Clinical Social Worker

## 2019-10-24 DIAGNOSIS — F3162 Bipolar disorder, current episode mixed, moderate: Secondary | ICD-10-CM

## 2019-10-24 DIAGNOSIS — Z23 Encounter for immunization: Secondary | ICD-10-CM | POA: Insufficient documentation

## 2019-10-24 NOTE — Progress Notes (Signed)
Virtual Visit via Telephone Note  I connected with Justin [name] on 10/24/19 at 10:00 AM EST by telephone and verified that I am speaking with the correct person using two identifiers.     I discussed the limitations, risks, security and privacy concerns of performing an evaluation and management service by telephone and the availability of in person appointments. I also discussed with the patient that there may be a patient responsible charge related to this service. The patient expressed understanding and agreed to proceed.  Type of Therapy: Individual Therapy  Treatment Goals addressed:"I'm really worried about my sleep issues and how the are effecting my memory and my mind, having more hallucinations, feeling out of it". Justin [name] will improve psychological functioning as evidenced by reduced experience of psychoses, improvement in sleep, and more stabilized mood 3 out of 7 days.  Interventions: CBT, Motivational Interviewing, and psychoeducation  Summary: Justin [name] is a 74y.o. male who presents with Bipolar 1 Disorder, Mixed, Moderate  Suicidal/Homicidal: No - without intent/plan  Therapist Response:  Justin [name] met with clinician for an individual session. Justin [name] discussed his psychiatric symptoms, his current life events and his homework.He reportsthat he has finally gone to the animal shelter and rescued a 74 year old sheppard/lab mix named Diesel. Clinician utilized [place] OARS to reflect and summarize the experience of finally making the decision to get a dog. Clinician identified the value and joy that has been added to his life. Justin [name] reports he feels important and loved by his dog and he knew he had a lot of love to give. Clinician processed mood and coping with this dog, who will likely be an emotional support. Clinician also noted that he has felt more alert and present in his daily life because he is no longer alone.    Plan: Return again in 2-3weeks.  Diagnosis: Axis  I: Bipolar 1 Disorder, Mixed, Moderate     I discussed the assessment and treatment plan with the patient. The patient was provided an opportunity to ask questions and all were answered. The patient agreed with the plan and demonstrated an understanding of the instructions.   The patient was advised to call back or seek an in-person evaluation if the symptoms worsen or if the condition fails to improve as anticipated.  I provided 45 minutes of non-face-to-face time during this encounter.   [name], LCSW

## 2019-10-24 NOTE — Progress Notes (Signed)
   Covid-19 Vaccination Clinic  Name:  Justin [name]    MRN: [id number] DOB: 1945/11/06  10/24/2019  Justin [name] was observed post Covid-19 immunization for 15 minutes without incident. He was provided with Vaccine Information Sheet and instruction to access the V-Safe system.   Justin [name] was instructed to call 911 with any severe reactions post vaccine: Marland Kitchen Difficulty breathing  . Swelling of face and throat  . A fast heartbeat  . A bad rash all over body  . Dizziness and weakness   Immunizations Administered    Name Date Dose VIS Date Route   Pfizer COVID-19 Vaccine 10/24/2019 11:54 AM 0.3 mL 08/02/2019 Intramuscular   Manufacturer: [place]   Lot: [id number]   [place]: [id number]

## 2019-11-12 ENCOUNTER — Ambulatory Visit: Payer: Medicare Other | Admitting: Internal Medicine

## 2019-11-13 ENCOUNTER — Ambulatory Visit: Payer: Medicare Other | Admitting: Internal Medicine

## 2019-11-18 ENCOUNTER — Encounter (HOSPITAL_COMMUNITY): Payer: Self-pay | Admitting: Psychiatry

## 2019-11-18 ENCOUNTER — Other Ambulatory Visit: Payer: Self-pay

## 2019-11-18 ENCOUNTER — Ambulatory Visit (INDEPENDENT_AMBULATORY_CARE_PROVIDER_SITE_OTHER): Payer: Medicare Other | Admitting: Psychiatry

## 2019-11-18 DIAGNOSIS — F4310 Post-traumatic stress disorder, unspecified: Secondary | ICD-10-CM | POA: Diagnosis not present

## 2019-11-18 DIAGNOSIS — F3162 Bipolar disorder, current episode mixed, moderate: Secondary | ICD-10-CM

## 2019-11-18 DIAGNOSIS — F411 Generalized anxiety disorder: Secondary | ICD-10-CM

## 2019-11-18 MED ORDER — LAMOTRIGINE 200 MG PO TABS: 200.0000 mg | ORAL_TABLET | Freq: Every day | ORAL | 2 refills | Status: DC

## 2019-11-18 MED ORDER — BENZTROPINE MESYLATE 0.5 MG PO TABS: 0.5000 mg | ORAL_TABLET | Freq: Two times a day (BID) | ORAL | 2 refills | Status: DC

## 2019-11-18 MED ORDER — ARIPIPRAZOLE 20 MG PO TABS: 20.0000 mg | ORAL_TABLET | Freq: Every day | ORAL | 2 refills | Status: DC

## 2019-11-18 MED ORDER — CLONAZEPAM 0.5 MG PO TABS: ORAL_TABLET | ORAL | 2 refills | Status: DC

## 2019-11-18 NOTE — Progress Notes (Signed)
Virtual Visit via Telephone Note  I connected with Justin [name] on 11/18/19 at 11:20 AM EDT by telephone and verified that I am speaking with the correct person using two identifiers.   I discussed the limitations, risks, security and privacy concerns of performing an evaluation and management service by telephone and the availability of in person appointments. I also discussed with the patient that there may be a patient responsible charge related to this service. The patient expressed understanding and agreed to proceed.   History of Present Illness: Patient was evaluated by phone session.  He is doing better since we increased the Abilify dose.  He is now taking 20 mg.  Denies any hallucination and he feels his paranoia is not as bad.  Also reported his mood is stable and he denies any irritability, anger or any crying spells.  He recently had a dog and he did not like to spend time with the dog.  He is in therapy with Janett Billow.  His sleep is better and he denies any recent nightmares or flashback.  He is excited because he got first dose of Covid and hoping to have a second dose in few days.  Denies any suicidal thoughts.  His relationship with son is going well.  He is compliant with Abilify, Lamictal, Cogentin and Klonopin he has no rash or any itching.  He is scheduled to have blood work with his PCP next week.  Recently he had a visit with neurology and had work-up for his memory problem.  Patient has bouts of amnestic spells and he was recommended not to drive.  Past Psychiatric History:Reviewed. H/O suicidal attemptby overdose on Ambientwice, hydroxyzine and alcohol. H/Obipolar disorder with multipleinpatient. DidIOP. [facility] Sept 2020 [facility].Tried Zyprexa, Cymbalta, Seroquel, Vistaril, Neurontin,Wellbutrin and triptal.Tried low-dose doxepinandtrazodone.Had a reaction with Ativan and pain medication. SawDr. Letta Moynahan inpast.  Recent  Results (from the past 2160 hour(s))  CBC with Differential     Status: Abnormal   Collection Time: 08/27/19  8:39 PM  Result Value Ref Range   WBC 3.1 (L) 4.0 - 10.5 K/uL   RBC 4.39 4.22 - 5.81 MIL/uL   Hemoglobin 14.1 13.0 - 17.0 g/dL   HCT 42.4 39.0 - 52.0 %   MCV 96.6 80.0 - 100.0 fL   MCH 32.1 26.0 - 34.0 pg   MCHC 33.3 30.0 - 36.0 g/dL   RDW 12.1 11.5 - 15.5 %   Platelets 240 150 - 400 K/uL   nRBC 0.0 0.0 - 0.2 %   Neutrophils Relative % 48 %   Neutro Abs 1.5 (L) 1.7 - 7.7 K/uL   Lymphocytes Relative 39 %   Lymphs Abs 1.2 0.7 - 4.0 K/uL   Monocytes Relative 11 %   Monocytes Absolute 0.4 0.1 - 1.0 K/uL   Eosinophils Relative 1 %   Eosinophils Absolute 0.0 0.0 - 0.5 K/uL   Basophils Relative 1 %   Basophils Absolute 0.0 0.0 - 0.1 K/uL   Immature Granulocytes 0 %   Abs Immature Granulocytes 0.01 0.00 - 0.07 K/uL    Comment: Performed at [facility], [place] [address] [place], [place] [address]  Basic metabolic panel     Status: Abnormal   Collection Time: 08/27/19  8:39 PM  Result Value Ref Range   Sodium 135 135 - 145 mmol/L   Potassium 3.9 3.5 - 5.1 mmol/L   Chloride 103 98 - 111 mmol/L   CO2 18 (L) 22 - 32 mmol/L  Glucose, Bld 75 70 - 99 mg/dL   BUN 21 8 - 23 mg/dL   Creatinine, Ser 1.01 0.61 - 1.24 mg/dL   Calcium 9.6 8.9 - 10.3 mg/dL   GFR calc non Af Amer >60 >60 mL/min   GFR calc Af Amer >60 >60 mL/min   Anion gap 14 5 - 15    Comment: Performed at [facility], [address] [place], [place] [address]  Troponin I (High Sensitivity)     Status: None   Collection Time: 08/27/19  8:39 PM  Result Value Ref Range   Troponin I (High Sensitivity) 3 <18 ng/L    Comment: (NOTE) Elevated high sensitivity troponin I (hsTnI) values and significant  changes across serial measurements may suggest ACS but many other  chronic and acute conditions are known to elevate hsTnI results.  Refer to the "Links" section for chest pain  algorithms and additional  guidance. Performed at [facility], [address] [place], [place] [address]   D-dimer, quantitative (not at [facility])     Status: None   Collection Time: 08/27/19  8:39 PM  Result Value Ref Range   D-Dimer, Quant 0.35 0.00 - 0.50 ug/mL-FEU    Comment: (NOTE) At the manufacturer cut-off of 0.50 ug/mL FEU, this assay has been documented to exclude PE with a sensitivity and negative predictive value of 97 to 99%.  At this time, this assay has not been approved by the FDA to exclude DVT/VTE. Results should be correlated with clinical presentation. Performed at [facility], [address] [place], [place] [address]   POC SARS Coronavirus 2 Ag-ED - Nasal Swab (BD Veritor Kit)     Status: Abnormal   Collection Time: 08/27/19  9:09 PM  Result Value Ref Range   SARS Coronavirus 2 Ag POSITIVE (A) NEGATIVE    Comment: (NOTE) SARS-CoV-2 antigen PRESENT. Positive results indicate the presence of viral antigens, but clinical correlation with patient history and other diagnostic information is necessary to determine patient infection status.  Positive results do not rule out bacterial infection or co-infection  with other viruses. False positive results are rare but can occur, and confirmatory RT-PCR testing may be appropriate in some circumstances. The expected result is Negative. Fact Sheet for Patients: https://www.fda.gov/media/139754/download Fact Sheet for Providers: https://www.fda.gov/media/139753/download  This test is not yet approved or cleared by the [place] FDA and  has been authorized for detection and/or diagnosis of SARS-CoV-2 by FDA under an Emergency Use Authorization (EUA).  This EUA will remain in effect (meaning this test can be used) for the duration of  the COVID-19 declaration under Section 564(b)(1) of the Act, 21 U.S.C. section 360bbb-3(b)(1), unless the a uthorization is terminated or revoked  sooner.   Troponin I (High Sensitivity)     Status: None   Collection Time: 08/27/19 10:02 PM  Result Value Ref Range   Troponin I (High Sensitivity) 3 <18 ng/L    Comment: (NOTE) Elevated high sensitivity troponin I (hsTnI) values and significant  changes across serial measurements may suggest ACS but many other  chronic and acute conditions are known to elevate hsTnI results.  Refer to the "Links" section for chest pain algorithms and additional  guidance. Performed at [facility], [address] [place],  [address]   Protein Electrophoresis, Serum     Status: Abnormal   Collection Time: 10/15/19  3:43 PM  Result Value Ref Range   Total Protein 6.4 6.0 - 8.5   g/dL   Albumin ELP 4.1 2.9 - 4.4 g/dL   Alpha 1 0.2 0.0 - 0.4 g/dL   Alpha 2 0.5 0.4 - 1.0 g/dL   Beta 0.8 0.7 - 1.3 g/dL   Gamma Globulin 0.8 0.4 - 1.8 g/dL   M-Spike, % Not Observed Not Observed g/dL   Globulin, Total 2.3 2.2 - 3.9 g/dL   A/G Ratio 1.8 (H) 0.7 - 1.7   Please Note: Comment     Comment: Protein electrophoresis scan will follow via computer, mail, or courier delivery.    Interpretation: Comment     Comment: The SPE pattern appears unremarkable. Evidence of monoclonal protein is not apparent.   Iron and TIBC     Status: None   Collection Time: 10/15/19  3:43 PM  Result Value Ref Range   Total Iron Binding Capacity 308 250 - 450 ug/dL   UIBC 196 111 - 343 ug/dL   Iron 112 38 - 169 ug/dL   Iron Saturation 36 15 - 55 %  Ceruloplasmin     Status: None   Collection Time: 10/15/19  3:43 PM  Result Value Ref Range   Ceruloplasmin 16.4 16.0 - 31.0 mg/dL  Vitamin B1     Status: None   Collection Time: 10/15/19  3:43 PM  Result Value Ref Range   Thiamine 105.0 66.5 - 200.0 nmol/L  Methylmalonic acid, serum     Status: None   Collection Time: 10/15/19  3:43 PM  Result Value Ref Range   Methylmalonic Acid 79 0 - 378 nmol/L   Disclaimer: Comment     Comment: This test was  developed and its performance characteristics determined by [facility]. It has not been cleared or approved by the Food and Drug Administration.       Psychiatric Specialty Exam: Physical Exam  Review of Systems  There were no vitals taken for this visit.There is no height or weight on file to calculate BMI.  General Appearance: NA  Eye Contact:  NA  Speech:  Clear and Coherent  Volume:  Normal  Mood:  Anxious  Affect:  NA  Thought Process:  Goal Directed  Orientation:  Full (Time, Place, and Person)  Thought Content:  Rumination  Suicidal Thoughts:  No  Homicidal Thoughts:  No  Memory:  Immediate;   Good Recent;   Fair Remote;   Fair  Judgement:  Fair  Insight:  Fair  Psychomotor Activity:  NA  Concentration:  Concentration: Fair and Attention Span: Fair  Recall:  Fair  Fund of Knowledge:  Good  Language:  Good  Akathisia:  No  Handed:  Right  AIMS (if indicated):     Assets:  Communication Skills Desire for Improvement Housing Resilience Social Support  ADL's:  Intact  Cognition:  Impaired,  Mild  Sleep:   better      Assessment and Plan: I reviewed blood work results and notes from neurology.  He has episodes of forgetfulness which could be due to medication but so far patient does not want to change medication since it is working and helping his mood and anxiety.  Patient was told not to drive by neurology.  Patient like to keep his current medication.  He feels better with increased Abilify.  Continue Abilify 20 mg daily, Cogentin 0.5 mg twice a day, Lamictal 200 mg daily of Klonopin 0.5 mg 1-2 Levbid as needed for anxiety.  Recommended to call us back if he has any question or any concern.  Encouraged to keep   appointment with Jessica.  Follow-up in 3 months.  Follow Up Instructions:    I discussed the assessment and treatment plan with the patient. The patient was provided an opportunity to ask questions and all were answered. The patient agreed with the plan  and demonstrated an understanding of the instructions.   The patient was advised to call back or seek an in-person evaluation if the symptoms worsen or if the condition fails to improve as anticipated.  I provided 20 minutes of non-face-to-face time during this encounter.    T , MD   

## 2019-11-19 ENCOUNTER — Ambulatory Visit: Payer: Medicare Other | Attending: Internal Medicine

## 2019-11-19 DIAGNOSIS — Z23 Encounter for immunization: Secondary | ICD-10-CM

## 2019-11-19 NOTE — Progress Notes (Signed)
   Covid-19 Vaccination Clinic  Name:  Justin [name]    MRN: :[id number] DOB: 1945-12-20  11/19/2019  [name] was observed post Covid-19 immunization for 15 minutes without incident. He was provided with Vaccine Information Sheet and instruction to access the V-Safe system.   [name] was instructed to call 911 with any severe reactions post vaccine: Mar[place] Kitchen Difficulty breathing  . Swelling of face and throat  . A fast heartbeat  . A bad rash all over body  . Dizziness and weakness   Immunizations Administered    Name Date Dose VIS Date Route   Pfizer COVID-19 Vaccine 11/19/2019  3:08 PM 0.3 mL 08/02/2019 Intramuscular   Manufacturer: [employer]   Lot: [id number]   [place]: [id number]

## 2019-11-21 ENCOUNTER — Other Ambulatory Visit: Payer: Self-pay

## 2019-11-21 ENCOUNTER — Ambulatory Visit: Payer: Medicare Other | Admitting: Internal Medicine

## 2019-11-21 ENCOUNTER — Encounter: Payer: Self-pay | Admitting: Internal Medicine

## 2019-11-21 VITALS — BP 158/90 | HR 72 | Temp 98.1°F | Ht 72.0 in | Wt 171.6 lb

## 2019-11-21 DIAGNOSIS — G4700 Insomnia, unspecified: Secondary | ICD-10-CM

## 2019-11-21 DIAGNOSIS — Z0000 Encounter for general adult medical examination without abnormal findings: Secondary | ICD-10-CM | POA: Diagnosis not present

## 2019-11-21 DIAGNOSIS — R739 Hyperglycemia, unspecified: Secondary | ICD-10-CM | POA: Diagnosis not present

## 2019-11-21 LAB — BASIC METABOLIC PANEL
BUN: 10 mg/dL (ref 6–23)
CO2: 27 mEq/L (ref 19–32)
Calcium: 9.1 mg/dL (ref 8.4–10.5)
Chloride: 99 mEq/L (ref 96–112)
Creatinine, Ser: 0.79 mg/dL (ref 0.40–1.50)
GFR: 96.01 mL/min (ref >60.00)
Glucose, Bld: 93 mg/dL (ref 70–99)
Potassium: 4.4 mEq/L (ref 3.5–5.1)
Sodium: 132 mEq/L — ABNORMAL LOW (ref 135–145)

## 2019-11-21 LAB — CBC WITH DIFFERENTIAL/PLATELET
Basophils Absolute: 0 10*3/uL (ref 0.0–0.1)
Basophils Relative: 0.6 % (ref 0.0–3.0)
Eosinophils Absolute: 0 10*3/uL (ref 0.0–0.7)
Eosinophils Relative: 0.7 % (ref 0.0–5.0)
HCT: 37.7 % — ABNORMAL LOW (ref 39.0–52.0)
Hemoglobin: 12.8 g/dL — ABNORMAL LOW (ref 13.0–17.0)
Lymphocytes Relative: 15.3 % (ref 12.0–46.0)
Lymphs Abs: 0.6 10*3/uL — ABNORMAL LOW (ref 0.7–4.0)
MCHC: 34 g/dL (ref 30.0–36.0)
MCV: 96.5 fl (ref 78.0–100.0)
Monocytes Absolute: 0.4 10*3/uL (ref 0.1–1.0)
Monocytes Relative: 11.4 % (ref 3.0–12.0)
Neutro Abs: 2.8 10*3/uL (ref 1.4–7.7)
Neutrophils Relative %: 72 % (ref 43.0–77.0)
Platelets: 213 10*3/uL (ref 150.0–400.0)
RBC: 3.9 Mil/uL — ABNORMAL LOW (ref 4.22–5.81)
RDW: 13.6 % (ref 11.5–15.5)
WBC: 3.8 10*3/uL — ABNORMAL LOW (ref 4.0–10.5)

## 2019-11-21 LAB — URINALYSIS, ROUTINE W REFLEX MICROSCOPIC
Bilirubin Urine: NEGATIVE
Hgb urine dipstick: NEGATIVE
Ketones, ur: 40 — AB
Leukocytes,Ua: NEGATIVE
Nitrite: NEGATIVE
Specific Gravity, Urine: 1.02 (ref 1.000–1.030)
Total Protein, Urine: NEGATIVE
Urine Glucose: NEGATIVE
Urobilinogen, UA: 0.2 (ref 0.0–1.0)
WBC, UA: NONE SEEN — AB (ref 0–?)
pH: 6.5 (ref 5.0–8.0)

## 2019-11-21 LAB — LIPID PANEL
Cholesterol: 126 mg/dL (ref 0–200)
HDL: 81.2 mg/dL (ref >39.00)
LDL Cholesterol: 38 mg/dL (ref 0–99)
NonHDL: 45.26
Total CHOL/HDL Ratio: 2
Triglycerides: 37 mg/dL (ref 0.0–149.0)
VLDL: 7.4 mg/dL (ref 0.0–40.0)

## 2019-11-21 LAB — HEPATIC FUNCTION PANEL
ALT: 31 U/L (ref 0–53)
AST: 53 U/L — ABNORMAL HIGH (ref 0–37)
Albumin: 4.2 g/dL (ref 3.5–5.2)
Alkaline Phosphatase: 67 U/L (ref 39–117)
Bilirubin, Direct: 0.2 mg/dL (ref 0.0–0.3)
Total Bilirubin: 0.9 mg/dL (ref 0.2–1.2)
Total Protein: 6.3 g/dL (ref 6.0–8.3)

## 2019-11-21 LAB — TSH: TSH: 0.56 u[IU]/mL (ref 0.35–4.50)

## 2019-11-21 LAB — HEMOGLOBIN A1C: Hgb A1c MFr Bld: 4.3 % — ABNORMAL LOW (ref 4.6–6.5)

## 2019-11-21 LAB — PSA: PSA: 1.91 ng/mL (ref 0.10–4.00)

## 2019-11-21 MED ORDER — TRAZODONE HCL 50 MG PO TABS: 25.0000 mg | ORAL_TABLET | Freq: Every evening | ORAL | 1 refills | Status: DC | PRN

## 2019-11-21 NOTE — Progress Notes (Signed)
Subjective:    Patient ID: Justin [name], male    DOB: [birth year], 74 y.o.   MRN: [id number]  HPI  Here for wellness and f/u;  Overall doing ok;  Pt denies Chest pain, worsening SOB, DOE, wheezing, orthopnea, PND, worsening LE edema, palpitations, dizziness or syncope.  Pt denies neurological change such as new headache, facial or extremity weakness.  Pt denies polydipsia, polyuria, or low sugar symptoms. Pt states overall good compliance with treatment and medications, good tolerability, and has been trying to follow appropriate diet.  Pt denies worsening depressive symptoms, suicidal ideation or panic. No fever, night sweats, wt loss, loss of appetite, or other constitutional symptoms.  Pt states good ability with ADL's, has low fall risk, home safety reviewed and adequate, no other significant changes in hearing or vision, and only occasionally active with exercise.  Asks for sleep med as Lorrin Mais did not tolerate well Past Medical History:  Diagnosis Date  . Anxiety and depression   . Arthritis    knees, c-spine // gets lumbar ESI q 6 mos  . Bipolar disorder ([place])   . CAD (coronary artery disease)    [place] 08/2009 >> LHC ([facility]) - mLAD 70, Dx 65 >> PCI:  3x15 mm Endeavor DES to mLAD and 2.5x12 mm Endeavor DES to Dx // S/p NSTEMI >> LHC 8/12 ([facility]) - LM ok, LAD prox and mid 40; LAD stent ok, Dx stent ok, mRCA 30, mLCx 50 >> med Rx // ETT-Echo 2/17 ([facility]): Normal, EF 55-60 at rest  . Chicken pox   . 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 Grief   . History of echocardiogram    Echo 3/19: Mild concentric LVH, EF 60-65, normal wall motion, grade 1 diastolic dysfunction, mild AI, mildly dilated aortic root (40 mm), MAC, mild LAE, atrial septal lipomatous hypertrophy  . History of non-ST elevation myocardial infarction (NSTEMI)   . History of nuclear stress test    Nuclear stress test 3/19: EF 57, inferior/inferoseptal/inferolateral defect consistent with probable soft tissue attenuation  (cannot exclude subendocardial scar), no ischemia, intermediate risk >> Echo 3/19 normal EF, normal wall motion  . History of pneumonia 2011  . History of prostatitis 1990s  . Hx of adenomatous colonic polyps 06/22/2016  . Hyperlipidemia   . Hypertension   . Migraines    prior history  . PTSD (post-traumatic stress disorder)    Past Surgical History:  Procedure Laterality Date  . CERVICAL DISCECTOMY    . COLONOSCOPY  03/2017  . ELBOW SURGERY Left   . ELBOW SURGERY Right   . INGUINAL HERNIA REPAIR Bilateral    1996, 1997  . KNEE ARTHROSCOPY Right   . KNEE CARTILAGE SURGERY Left   . NASAL SEPTUM SURGERY    . STENT PLACEMENT VASCULAR ([place] [facility])  09/02/2010  . TONSILLECTOMY    . TOTAL KNEE ARTHROPLASTY Left 08/31/2018   Procedure: TOTAL KNEE ARTHROPLASTY;  Surgeon: Dorna Leitz, MD;  Location: [facility];  Service: Orthopedics;  Laterality: Left;    reports that he has never smoked. He has never used smokeless tobacco. He reports that he does not drink alcohol or use drugs. family history includes Alzheimer's disease in his mother; Arthritis in his father and mother; Depression in his brother; Diabetes in his sister and sister; Heart attack in his father, mother, and sister; Heart disease in his father and mother; Hyperlipidemia in his father; Hypertension in his father and mother; Lung cancer in his maternal aunt, paternal aunt, and  paternal grandmother; Multiple sclerosis in his sister; Pulmonary embolism in his sister; Stroke in his father. Allergies  Allergen Reactions  . Ambien [Zolpidem Tartrate] Other (See Comments)    Blackout, memory issues  . Seroquel [Quetiapine]     SEVERE NIGHTMARES, SLEEPWALK AND NIGHT DRIVE WITH NO RECOLLECTION UPON WAKENING   Current Outpatient Medications on File Prior to Visit  Medication Sig Dispense Refill  . ARIPiprazole (ABILIFY) 20 MG tablet Take 1 tablet (20 mg total) by mouth daily. 30 tablet 2  . benztropine (COGENTIN) 0.5 MG tablet Take 1 tablet  (0.5 mg total) by mouth 2 (two) times daily. 60 tablet 2  . clonazePAM (KLONOPIN) 0.5 MG tablet Take one tab daily and 2nd as needed for severe anxiety 45 tablet 2  . clopidogrel (PLAVIX) 75 MG tablet TAKE 1 TABLET BY MOUTH DAILY 30 tablet 11  . diltiazem (CARTIA XT) 180 MG 24 hr capsule Take 1 capsule (180 mg total) by mouth daily. 30 capsule 11  . lamoTRIgine (LAMICTAL) 200 MG tablet Take 1 tablet (200 mg total) by mouth daily. For mood control 30 tablet 2  . nitroGLYCERIN (NITROSTAT) 0.4 MG SL tablet Place 1 tablet (0.4 mg total) under the tongue every 5 (five) minutes as needed for chest pain. 25 tablet 3  . rosuvastatin (CRESTOR) 20 MG tablet Take 1 tablet by mouth once daily 90 tablet 2   No current facility-administered medications on file prior to visit.   Review of Systems All otherwise neg per pt     Objective:   Physical Exam BP (!) 158/90   Pulse 72   Temp 98.1 F (36.7 C)   Ht 6' (1.829 m)   Wt 171 lb 9.6 oz (77.8 kg)   SpO2 98%   BMI 23.27 kg/m  VS noted,  Constitutional: Pt appears in NAD HENT: Head: NCAT.  Right Ear: External ear normal.  Left Ear: External ear normal.  Eyes: . Pupils are equal, round, and reactive to light. Conjunctivae and EOM are normal Nose: without d/c or deformity Neck: Neck supple. Gross normal ROM Cardiovascular: Normal rate and regular rhythm.   Pulmonary/Chest: Effort normal and breath sounds without rales or wheezing.  Abd:  Soft, NT, ND, + BS, no organomegaly Neurological: Pt is alert. At baseline orientation, motor grossly intact Skin: Skin is warm. No rashes, other new lesions, no LE edema Psychiatric: Pt behavior is normal without agitation  All otherwise neg per pt Lab Results  Component Value Date   WBC 3.8 (L) 11/21/2019   HGB 12.8 (L) 11/21/2019   HCT 37.7 (L) 11/21/2019   PLT 213.0 11/21/2019   GLUCOSE 93 11/21/2019   CHOL 126 11/21/2019   TRIG 37.0 11/21/2019   HDL 81.20 11/21/2019   LDLCALC 38 11/21/2019   ALT  31 11/21/2019   AST 53 (H) 11/21/2019   NA 132 (L) 11/21/2019   K 4.4 11/21/2019   CL 99 11/21/2019   CREATININE 0.79 11/21/2019   BUN 10 11/21/2019   CO2 27 11/21/2019   TSH 0.56 11/21/2019   PSA 1.91 11/21/2019   INR 0.95 08/21/2018   HGBA1C 4.3 (L) 11/21/2019      Assessment & Plan:

## 2019-11-21 NOTE — Patient Instructions (Addendum)
Please take all new medication as prescribed - the trazodone for sleep  Please continue all other medications as before, and refills have been done if requested.  Please have the pharmacy call with any other refills you may need.  Please continue your efforts at being more active, low cholesterol diet, and weight control.  You are otherwise up to date with prevention measures today.  Please keep your appointments with your specialists as you may have planned  Please go to the LAB at the blood drawing area for the tests to be done  You will be contacted by phone if any changes need to be made immediately.  Otherwise, you will receive a letter about your results with an explanation, but please check with MyChart first.  Please remember to sign up for MyChart if you have not done so, as this will be important to you in the future with finding out test results, communicating by private email, and scheduling acute appointments online when needed.  Please make an Appointment to return in 6 months, or sooner if needed

## 2019-11-22 ENCOUNTER — Encounter: Payer: Self-pay | Admitting: Internal Medicine

## 2019-11-22 DIAGNOSIS — G4700 Insomnia, unspecified: Secondary | ICD-10-CM | POA: Insufficient documentation

## 2019-11-22 NOTE — Assessment & Plan Note (Signed)
stable overall by history and exam, recent data reviewed with pt, and pt to continue medical treatment as before,  to f/u any worsening symptoms or concerns  

## 2019-11-22 NOTE — Assessment & Plan Note (Signed)
Ok for trazodone qhs prn 

## 2019-11-22 NOTE — Assessment & Plan Note (Signed)

## 2019-12-09 ENCOUNTER — Ambulatory Visit (INDEPENDENT_AMBULATORY_CARE_PROVIDER_SITE_OTHER): Payer: Medicare Other | Admitting: Licensed Clinical Social Worker

## 2019-12-09 ENCOUNTER — Other Ambulatory Visit: Payer: Self-pay

## 2019-12-09 ENCOUNTER — Encounter (HOSPITAL_COMMUNITY): Payer: Self-pay | Admitting: Licensed Clinical Social Worker

## 2019-12-09 DIAGNOSIS — F3162 Bipolar disorder, current episode mixed, moderate: Secondary | ICD-10-CM

## 2019-12-09 NOTE — Progress Notes (Signed)
Virtual Visit via Telephone Note  I connected with Justin [name] on 12/09/19 at  3:30 PM EDT by telephone and verified that I am speaking with the correct person using two identifiers.    I discussed the limitations, risks, security and privacy concerns of performing an evaluation and management service by telephone and the availability of in person appointments. I also discussed with the patient that there may be a patient responsible charge related to this service. The patient expressed understanding and agreed to proceed.  Type of Therapy: Individual Therapy   Treatment Goals addressed: "I'm really worried about my sleep issues and how the are effecting my memory and my mind, having more hallucinations, feeling out of it". Justin [name] will improve psychological functioning as evidenced by reduced experience of psychoses, improvement in sleep, and more stabilized mood 3 out of 7 days.   Interventions: CBT, Motivational Interviewing, and psychoeducation   Summary: Justin [name] is a 74 y.o. male who presents with Bipolar 1 Disorder, Mixed, Moderate   Suicidal/Homicidal: No - without intent/plan   Therapist Response:   Tariq met with clinician for an individual session. [name] discussed his psychiatric symptoms, his current life events and his homework. He reports that he continues to feel better than he had last time we spoke. Clinician explored what has been working, and identified that getting his dog has really made a difference in his daily mood and life. Clinician processed experience of having unconditional love, support, and companionship. Clinician also identified the value of waiting until he is ready to do something. Clinician discussed the importance of trusting himself to do what he believes is right for him. Justin [name] reported on an issue with his son and daughter in Sports [employer]. Clinician utilized CBT to identify thoughts, feelings, and behaviors. Clinician also discussed unrealistic expectations  and how they breed resentments. Clinician encouraged Justin [name] to be less reliant on them for socialization and to focus on what he can control.       Plan: Return again in 2-3 weeks.    Diagnosis: Axis I: Bipolar 1 Disorder, Mixed, Moderate      I discussed the assessment and treatment plan with the patient. The patient was provided an opportunity to ask questions and all were answered. The patient agreed with the plan and demonstrated an understanding of the instructions.   The patient was advised to call back or seek an in-person evaluation if the symptoms worsen or if the condition fails to improve as anticipated.  I provided 45 minutes of non-face-to-face time during this encounter.   [name], LCSW

## 2019-12-10 ENCOUNTER — Telehealth: Payer: Self-pay

## 2019-12-10 NOTE — Telephone Encounter (Signed)
Ok to assist pt with ROV next available

## 2019-12-10 NOTE — Telephone Encounter (Signed)
Alice, NP with [employer] with [employer] and states that she was just out with the patient and states that he has a knot on the left medial aspect of his calf. No redness, swelling or warmth. States that it has been there for a week and a half. Would like to know if you would like to see the patient for this issue. Please advise.  CB#: [phone]

## 2019-12-10 NOTE — Telephone Encounter (Signed)
See message.

## 2019-12-17 NOTE — Telephone Encounter (Signed)
Called and spoke with patient he stated that he was fine, was okay even the day the message was sent to doctor.   Advised patient if felt worse please call and schedule an appointment to be seen

## 2019-12-24 ENCOUNTER — Other Ambulatory Visit: Payer: Self-pay

## 2019-12-24 ENCOUNTER — Encounter (HOSPITAL_COMMUNITY): Payer: Self-pay | Admitting: Licensed Clinical Social Worker

## 2019-12-24 ENCOUNTER — Ambulatory Visit (INDEPENDENT_AMBULATORY_CARE_PROVIDER_SITE_OTHER): Payer: Medicare Other | Admitting: Licensed Clinical Social Worker

## 2019-12-24 DIAGNOSIS — F3162 Bipolar disorder, current episode mixed, moderate: Secondary | ICD-10-CM

## 2019-12-24 NOTE — Progress Notes (Signed)
Virtual Visit via Telephone Note  I connected with Justin [name] on 12/24/19 at  1:30 PM EDT by telephone and verified that I am speaking with the correct person using two identifiers.   I discussed the limitations, risks, security and privacy concerns of performing an evaluation and management service by telephone and the availability of in person appointments. I also discussed with the patient that there may be a patient responsible charge related to this service. The patient expressed understanding and agreed to proceed.   Type of Therapy: Individual Therapy   Treatment Goals addressed: "I'm really worried about my sleep issues and how the are effecting my memory and my mind, having more hallucinations, feeling out of it". Justin [name] will improve psychological functioning as evidenced by reduced experience of psychoses, improvement in sleep, and more stabilized mood 3 out of 7 days.   Interventions: CBT, Motivational Interviewing, and psychoeducation   Summary: Justin [name] is a 74 y.o. male who presents with Bipolar 1 Disorder, Mixed, Moderate   Suicidal/Homicidal: No - without intent/plan   Therapist Response:   Justin [name] met with clinician for an individual session. Corgan discussed his psychiatric symptoms, his current life events and his homework. He reports ongoing cycling between mania and depression. Clinician explored most recent manic episode, where he was up and doing all kinds of activity early in the morning, barely sitting down until later in the evening. Clinician provided CBT psychoeducation about Bipolar Disorder cycling and noted the impact that stress and anxiety may have on triggering mania. Clinician processed different experiences with son and daughter in law. Clinician noted that Justin [name] continues to mourn the loss of his wife and dog, including feelings of guilt that he is here and they are not. Clinician processed happiness that his new dog is so attached to him. Clinician also  encouraged Justin [name] to continue keeping his wife's memory alive by telling stories about her to the children and grandchildren.      Plan: Return again in 2-3 weeks.    Diagnosis: Axis I: Bipolar 1 Disorder, Mixed, Moderate      I discussed the assessment and treatment plan with the patient. The patient was provided an opportunity to ask questions and all were answered. The patient agreed with the plan and demonstrated an understanding of the instructions.   The patient was advised to call back or seek an in-person evaluation if the symptoms worsen or if the condition fails to improve as anticipated.  I provided 45 minutes of non-face-to-face time during this encounter.   [name], LCSW

## 2020-01-07 ENCOUNTER — Other Ambulatory Visit: Payer: Self-pay

## 2020-01-07 ENCOUNTER — Encounter (HOSPITAL_COMMUNITY): Payer: Self-pay | Admitting: Licensed Clinical Social Worker

## 2020-01-07 ENCOUNTER — Ambulatory Visit (INDEPENDENT_AMBULATORY_CARE_PROVIDER_SITE_OTHER): Payer: Medicare Other | Admitting: Licensed Clinical Social Worker

## 2020-01-07 DIAGNOSIS — F3162 Bipolar disorder, current episode mixed, moderate: Secondary | ICD-10-CM | POA: Diagnosis not present

## 2020-01-07 NOTE — Progress Notes (Signed)
Virtual Visit via Telephone Note  I connected with Justin [name] on 01/07/20 at  2:30 PM EDT by telephone and verified that I am speaking with the correct person using two identifiers.    I discussed the limitations, risks, security and privacy concerns of performing an evaluation and management service by telephone and the availability of in person appointments. I also discussed with the patient that there may be a patient responsible charge related to this service. The patient expressed understanding and agreed to proceed.  Type of Therapy: Individual Therapy   Treatment Goals addressed: "I'm really worried about my sleep issues and how they are effecting my memory and my mind, having more hallucinations, feeling out of it". Justin [name] will improve psychological functioning as evidenced by reduced experience of psychoses, improvement in sleep, and more stabilized mood 3 out of 7 days.   Interventions: CBT, Motivational Interviewing, and psychoeducation   Summary: Justin [name] is a 74 y.o. male who presents with Bipolar 1 Disorder, Mixed, Moderate   Suicidal/Homicidal: No - without intent/plan   Therapist Response: Justin [name] met with clinician for an individual session. Justin [name] discussed his psychiatric symptoms, his current life events and his homework. He reports he has been feeling a bit better than he did at our last session. Clinician utilized CBT to explore changes in mood and interactions. Clinician noted improvement in relationship with son over the past week and reflected the importance of that relationship. Clinician provided psychoeducation about Bipolar Disorder and identified the value in acceptance of the diagnosis, as well as lack of judgement about having Bipolar Disorder. Clinician reminded Justin [name] that this was not a punishment or a pox, but a chemical imbalance and a medical disorder. Justin [name] appreciated this and was able to create his own metaphor to understand this better. Clinician  explored relationship with dog and noted that this has been highly impactful and helpful in his daily life and his family. Clinician noted the importance of being in a routine for himself, but also for his dog.    Justin [name] reports ongoing sleep issues, noting that his medications have helped somewhat, but he still does not feel like he gets a stable amount of rest each night.  Plan: Return again in 2-3 weeks.   Diagnosis: Axis I: Bipolar 1 Disorder, Mixed, Moderate     I discussed the assessment and treatment plan with the patient. The patient was provided an opportunity to ask questions and all were answered. The patient agreed with the plan and demonstrated an understanding of the instructions.   The patient was advised to call back or seek an in-person evaluation if the symptoms worsen or if the condition fails to improve as anticipated.  I provided 50 minutes of non-face-to-face time during this encounter.   [name], LCSW

## 2020-01-23 ENCOUNTER — Encounter (HOSPITAL_COMMUNITY): Payer: Self-pay | Admitting: Licensed Clinical Social Worker

## 2020-01-23 ENCOUNTER — Other Ambulatory Visit: Payer: Self-pay

## 2020-01-23 ENCOUNTER — Other Ambulatory Visit (HOSPITAL_COMMUNITY): Payer: Self-pay | Admitting: *Deleted

## 2020-01-23 ENCOUNTER — Telehealth (HOSPITAL_COMMUNITY): Payer: Self-pay | Admitting: *Deleted

## 2020-01-23 ENCOUNTER — Ambulatory Visit (INDEPENDENT_AMBULATORY_CARE_PROVIDER_SITE_OTHER): Payer: Medicare Other | Admitting: Licensed Clinical Social Worker

## 2020-01-23 DIAGNOSIS — F411 Generalized anxiety disorder: Secondary | ICD-10-CM

## 2020-01-23 DIAGNOSIS — F4310 Post-traumatic stress disorder, unspecified: Secondary | ICD-10-CM

## 2020-01-23 DIAGNOSIS — F3162 Bipolar disorder, current episode mixed, moderate: Secondary | ICD-10-CM | POA: Diagnosis not present

## 2020-01-23 MED ORDER — LAMOTRIGINE 25 MG PO TABS: 25.0000 mg | ORAL_TABLET | Freq: Every day | ORAL | 2 refills | Status: DC

## 2020-01-23 NOTE — Telephone Encounter (Signed)
Writer had to leave VM on cell # as home phone "is not in service", regarding adding 25mg  more to the existing 200mg  Rx. This nurse explained how to take the medication (with the 200mg  qd) and that medication has been sent in if he chooses to increase dose. Pt encouraged to call this nurse with any questions or concerns about medication.

## 2020-01-23 NOTE — Progress Notes (Signed)
Virtual Visit via Telephone Note  I connected with Justin [name] on 01/23/20 at  3:30 PM EDT by telephone and verified that I am speaking with the correct person using two identifiers.  Location: Patient: home Provider: home office   I discussed the limitations, risks, security and privacy concerns of performing an evaluation and management service by telephone and the availability of in person appointments. I also discussed with the patient that there may be a patient responsible charge related to this service. The patient expressed understanding and agreed to proceed.  Type of Therapy: Individual Therapy   Treatment Goals addressed: "I'm really worried about my sleep issues and how they are effecting my memory and my mind, having more hallucinations, feeling out of it". Justin [name] will improve psychological functioning as evidenced by reduced experience of psychoses, improvement in sleep, and more stabilized mood 3 out of 7 days.   Interventions: CBT, Motivational Interviewing, and psychoeducation   Summary: Justin [name] is a 74 y.o. male who presents with Bipolar 1 Disorder, Mixed, Moderate   Suicidal/Homicidal: No - without intent/plan   Therapist Response: Justin [name] met with clinician for an individual session. Laura discussed his psychiatric symptoms, his current life events and his homework. He reports his sleep issues are really getting him depressed. Clinician explored sleep problems, noting that he is unable to fall asleep at night, stay asleep for very long, and he has been experiencing more irritable mood and sleepiness during the day. Justin [name] also reported that he has less ability to focus and concentrate on what he is doing, and he feels less interested in going out to do things. Clinician processed these thoughts and feelings using CBT. Clinician explored the challenges with napping, noting sleep hygiene issues. Clinician reached out to Dr. Adele Schilder to request an earlier appointment and  some assistance with medication.  Clinician processed recent communication with son and daughter. Clinician utilized MI to reflect happiness at having a long and positive conversation with daughter. Clinician also noted that son has been more communicative over the past few weeks.     Plan: Return again in 2-3 weeks.    Diagnosis: Axis I: Bipolar 1 Disorder, Mixed, Moderate    I discussed the assessment and treatment plan with the patient. The patient was provided an opportunity to ask questions and all were answered. The patient agreed with the plan and demonstrated an understanding of the instructions.   The patient was advised to call back or seek an in-person evaluation if the symptoms worsen or if the condition fails to improve as anticipated.  I provided 45 minutes of non-face-to-face time during this encounter.   [name], LCSW

## 2020-01-27 ENCOUNTER — Encounter (HOSPITAL_COMMUNITY): Payer: Self-pay | Admitting: Psychiatry

## 2020-01-27 ENCOUNTER — Other Ambulatory Visit: Payer: Self-pay

## 2020-01-27 ENCOUNTER — Ambulatory Visit (INDEPENDENT_AMBULATORY_CARE_PROVIDER_SITE_OTHER): Payer: Medicare Other | Admitting: Psychiatry

## 2020-01-27 DIAGNOSIS — F4310 Post-traumatic stress disorder, unspecified: Secondary | ICD-10-CM | POA: Diagnosis not present

## 2020-01-27 DIAGNOSIS — F411 Generalized anxiety disorder: Secondary | ICD-10-CM

## 2020-01-27 DIAGNOSIS — F3162 Bipolar disorder, current episode mixed, moderate: Secondary | ICD-10-CM

## 2020-01-27 MED ORDER — LAMOTRIGINE 200 MG PO TABS: 200.0000 mg | ORAL_TABLET | Freq: Every day | ORAL | 2 refills | Status: DC

## 2020-01-27 MED ORDER — ALPRAZOLAM 0.5 MG PO TABS: ORAL_TABLET | ORAL | 2 refills | Status: DC

## 2020-01-27 MED ORDER — ARIPIPRAZOLE 20 MG PO TABS: 20.0000 mg | ORAL_TABLET | Freq: Every day | ORAL | 2 refills | Status: DC

## 2020-01-27 MED ORDER — BENZTROPINE MESYLATE 0.5 MG PO TABS: 0.5000 mg | ORAL_TABLET | Freq: Two times a day (BID) | ORAL | 2 refills | Status: DC

## 2020-01-27 NOTE — Progress Notes (Signed)
Virtual Visit via Telephone Note  I connected with Justin [name] on 01/27/20 at  1:40 PM EDT by telephone and verified that I am speaking with the correct person using two identifiers.   I discussed the limitations, risks, security and privacy concerns of performing an evaluation and management service by telephone and the availability of in person appointments. I also discussed with the patient that there may be a patient responsible charge related to this service. The patient expressed understanding and agreed to proceed.  Patient location; home Provider location; home office  History of Present Illness: Patient is evaluated by phone session.  Recently he had complaining of anxiety and poor sleep.  He is not sure what trigger but his son has a party and there are a lot of people in the house and since then he has increased anxiety as he is not comfortable with the crowd.  He is in therapy with Janett Billow and lately we increased Lamictal to help his depression.  He is sleeping better now.  He has not taking Ativan because he feel he does not help as much.  His PCP prescribed trazodone but he had stopped taking it because it makes him more depressed.  Like to go back on Xanax.  Otherwise he is compliant with Abilify, Lamictal, Cogentin.  He has no rash, itching tremors or shakes.  He denies any suicidal thoughts.  Ready level is okay.  Recently he had blood work and his hemoglobin A1c is normal.  His relationship with the son is going well.   Past Psychiatric History:Reviewed. H/O suicidal attemptby overdose on Ambientwice, hydroxyzine and alcohol. H/Obipolar disorder with multipleinpatient. DidIOP. [facility] Sept [facility].Tried Zyprexa, Cymbalta, Seroquel, Vistaril, Neurontin,Wellbutrin and triptal.Tried low-dose doxepinandtrazodone.Had a reaction with Ativan and pain medication. SawDr. Letta Moynahan inpast.  Recent Results (from the past 2160  hour(s))  PSA     Status: None   Collection Time: 11/21/19  2:06 PM  Result Value Ref Range   PSA 1.91 0.10 - 4.00 ng/mL    Comment: Test performed using Access Hybritech PSA Assay, a parmagnetic partical, chemiluminecent immunoassay.  Urinalysis, Routine w reflex microscopic     Status: Abnormal   Collection Time: 11/21/19  2:06 PM  Result Value Ref Range   Color, Urine YELLOW Yellow;Lt. Yellow;Straw;Dark Yellow;Amber;Green;Red;Brown   APPearance CLEAR Clear;Turbid;Slightly Cloudy;Cloudy   Specific Gravity, Urine 1.020 1.000 - 1.030   pH 6.5 5.0 - 8.0   Total Protein, Urine NEGATIVE Negative   Urine Glucose NEGATIVE Negative   Ketones, ur 40 (A) Negative   Bilirubin Urine NEGATIVE Negative   Hgb urine dipstick NEGATIVE Negative   Urobilinogen, UA 0.2 0.0 - 1.0   Leukocytes,Ua NEGATIVE Negative   Nitrite NEGATIVE Negative   WBC, UA No formed elements seen on urine microscopic exam. (A) 0-2/hpf  TSH     Status: None   Collection Time: 11/21/19  2:06 PM  Result Value Ref Range   TSH 0.56 0.35 - 4.50 uIU/mL  CBC with Differential/Platelet     Status: Abnormal   Collection Time: 11/21/19  2:06 PM  Result Value Ref Range   WBC 3.8 (L) 4.0 - 10.5 K/uL   RBC 3.90 (L) 4.22 - 5.81 Mil/uL   Hemoglobin 12.8 (L) 13.0 - 17.0 g/dL   HCT 37.7 (L) 39.0 - 52.0 %   MCV 96.5 78.0 - 100.0 fl   MCHC 34.0 30.0 - 36.0 g/dL   RDW 13.6 11.5 - 15.5 %   Platelets  213.0 150.0 - 400.0 K/uL   Neutrophils Relative % 72.0 43.0 - 77.0 %   Lymphocytes Relative 15.3 12.0 - 46.0 %   Monocytes Relative 11.4 3.0 - 12.0 %   Eosinophils Relative 0.7 0.0 - 5.0 %   Basophils Relative 0.6 0.0 - 3.0 %   Neutro Abs 2.8 1.4 - 7.7 K/uL   Lymphs Abs 0.6 (L) 0.7 - 4.0 K/uL   Monocytes Absolute 0.4 0.1 - 1.0 K/uL   Eosinophils Absolute 0.0 0.0 - 0.7 K/uL   Basophils Absolute 0.0 0.0 - 0.1 K/uL  Hepatic function panel     Status: Abnormal   Collection Time: 11/21/19  2:06 PM  Result Value Ref Range   Total Bilirubin  0.9 0.2 - 1.2 mg/dL   Bilirubin, Direct 0.2 0.0 - 0.3 mg/dL   Alkaline Phosphatase 67 39 - 117 U/L   AST 53 (H) 0 - 37 U/L   ALT 31 0 - 53 U/L   Total Protein 6.3 6.0 - 8.3 g/dL   Albumin 4.2 3.5 - 5.2 g/dL  Basic metabolic panel     Status: Abnormal   Collection Time: 11/21/19  2:06 PM  Result Value Ref Range   Sodium 132 (L) 135 - 145 mEq/L   Potassium 4.4 3.5 - 5.1 mEq/L   Chloride 99 96 - 112 mEq/L   CO2 27 19 - 32 mEq/L   Glucose, Bld 93 70 - 99 mg/dL   BUN 10 6 - 23 mg/dL   Creatinine, Ser 0.79 0.40 - 1.50 mg/dL   GFR 96.01 >60.00 mL/min   Calcium 9.1 8.4 - 10.5 mg/dL  Lipid panel     Status: None   Collection Time: 11/21/19  2:06 PM  Result Value Ref Range   Cholesterol 126 0 - 200 mg/dL    Comment: ATP III Classification       Desirable:  < 200 mg/dL               Borderline High:  200 - 239 mg/dL          High:  > = 240 mg/dL   Triglycerides 37.0 0.0 - 149.0 mg/dL    Comment: Normal:  <150 mg/dLBorderline High:  150 - 199 mg/dL   HDL 81.20 >39.00 mg/dL   VLDL 7.4 0.0 - 40.0 mg/dL   LDL Cholesterol 38 0 - 99 mg/dL   Total CHOL/HDL Ratio 2     Comment:                Men          Women1/2 Average Risk     3.4          3.3Average Risk          5.0          4.42X Average Risk          9.6          7.13X Average Risk          15.0          11.0                       NonHDL 45.26     Comment: NOTE:  Non-HDL goal should be 30 mg/dL higher than patient's LDL goal (i.e. LDL goal of < 70 mg/dL, would have non-HDL goal of < 100 mg/dL)  Hemoglobin A1c     Status: Abnormal   Collection Time: 11/21/19  2:06 PM  Result Value Ref Range   Hgb A1c MFr Bld 4.3 (L) 4.6 - 6.5 %    Comment: Glycemic Control Guidelines for People with Diabetes:Non Diabetic:  <6%Goal of Therapy: <7%Additional Action Suggested:  >8%       Psychiatric Specialty Exam: Physical Exam  Review of Systems  Weight 164 lb (74.4 kg).Body mass index is 22.24 kg/m.  General Appearance: NA  Eye Contact:  NA   Speech:  Slow  Volume:  Normal  Mood:  Anxious and Dysphoric  Affect:  NA  Thought Process:  Descriptions of Associations: Intact  Orientation:  Full (Time, Place, and Person)  Thought Content:  Rumination  Suicidal Thoughts:  No  Homicidal Thoughts:  No  Memory:  Immediate;   Good Recent;   Good Remote;   Good  Judgement:  Intact  Insight:  Fair  Psychomotor Activity:  NA  Concentration:  Concentration: Fair and Attention Span: Fair  Recall:  Good  Fund of Knowledge:  Good  Language:  Good  Akathisia:  No  Handed:  Right  AIMS (if indicated):     Assets:  Communication Skills Desire for Improvement Housing Resilience Social Support Transportation  ADL's:  Intact  Cognition:  WNL  Sleep:   better now      Assessment and Plan: Bipolar disorder type I.  Generalized anxiety disorder.  PTSD.  I reviewed his blood work.  His hemoglobin A1c is normal.  He continues to struggle with insomnia and anxiety but denies any recent nightmares or flashbacks.  He like to go back on Xanax which is prescribed by PCP.  I will discontinue Klonopin and trazodone.  We will start Xanax 0.5 mg to take every day and second if needed for severe anxiety.  We discussed not to abuse and take more than it is prescribed.  He agreed with the plan.  We also increase Lamictal to 225 mg and so far he is tolerating without any side effects.  Encouraged to continue therapy with Janett Billow.  Continue Abilify 20 mg daily, Cogentin 0.5 mg twice a day.  Recommended to call us back if is any question or any concern.  Follow-up in 10 weeks.  Follow Up Instructions:    I discussed the assessment and treatment plan with the patient. The patient was provided an opportunity to ask questions and all were answered. The patient agreed with the plan and demonstrated an understanding of the instructions.   The patient was advised to call back or seek an in-person evaluation if the symptoms worsen or if the condition fails  to improve as anticipated.  I provided 20 minutes of non-face-to-face time during this encounter.   Kathlee Nations, MD

## 2020-02-03 ENCOUNTER — Other Ambulatory Visit: Payer: Self-pay

## 2020-02-03 ENCOUNTER — Encounter (HOSPITAL_COMMUNITY): Payer: Self-pay | Admitting: Licensed Clinical Social Worker

## 2020-02-03 ENCOUNTER — Ambulatory Visit (INDEPENDENT_AMBULATORY_CARE_PROVIDER_SITE_OTHER): Payer: Medicare Other | Admitting: Licensed Clinical Social Worker

## 2020-02-03 DIAGNOSIS — F3162 Bipolar disorder, current episode mixed, moderate: Secondary | ICD-10-CM

## 2020-02-03 NOTE — Progress Notes (Signed)
Virtual Visit via Video Note  I connected with Justin [name] on 02/03/20 at  3:30 PM EDT by a video enabled telemedicine application and verified that I am speaking with the correct person using two identifiers.  Location: Patient: home Provider: office   I discussed the limitations of evaluation and management by telemedicine and the availability of in person appointments. The patient expressed understanding and agreed to proceed.   Type of Therapy: Individual Therapy   Treatment Goals addressed: "I'm really worried about my sleep issues and how they are effecting my memory and my mind, having more hallucinations, feeling out of it". Justin [name] will improve psychological functioning as evidenced by reduced experience of psychoses, improvement in sleep, and more stabilized mood 3 out of 7 days.   Interventions: CBT, Motivational Interviewing, and psychoeducation   Summary: Justin [name] is a 74 y.o. male who presents with Bipolar 1 Disorder, Mixed, Moderate   Suicidal/Homicidal: No - without intent/plan   Therapist Response: Justin [name] met with clinician for an individual session. Justin [name] discussed his psychiatric symptoms, his current life events and his homework. He reports he is doing much better this week now that sleep has improved and he is back on Xanax. Clinician explored updates on medication after last appointment with Dr. Adele Schilder. Clinician noted that Justin [name] continues to be responsible with his medication and has been taking it as directed. Clinician discussed recent interactions with extended family and in-laws, in which Justin [name] was more empowered to be himself and to be social. Clinician identified improvement in self confidence and less concern about how these people feel about him. Clinician processed the change in Justin [name]'s feelings after the event and noted that he felt happy to have been himself and not cower or hide out.  Clinician followed up on adjustment to his dog. Justin [name] reports he is so  happy to have this dog and he really feels connected and responsible. He reports this has given him a purpose, a companion, and an object for his love.      Plan: Return again in 2-3 weeks.    Diagnosis: Axis I: Bipolar 1 Disorder, Mixed, Moderate  I discussed the assessment and treatment plan with the patient. The patient was provided an opportunity to ask questions and all were answered. The patient agreed with the plan and demonstrated an understanding of the instructions.   The patient was advised to call back or seek an in-person evaluation if the symptoms worsen or if the condition fails to improve as anticipated.  I provided 50 minutes of non-face-to-face time during this encounter.   [name], LCSW

## 2020-02-18 ENCOUNTER — Ambulatory Visit (HOSPITAL_COMMUNITY): Payer: Medicare Other | Admitting: Psychiatry

## 2020-02-19 ENCOUNTER — Ambulatory Visit (INDEPENDENT_AMBULATORY_CARE_PROVIDER_SITE_OTHER): Payer: Medicare Other | Admitting: Licensed Clinical Social Worker

## 2020-02-19 ENCOUNTER — Other Ambulatory Visit: Payer: Self-pay

## 2020-02-19 ENCOUNTER — Encounter (HOSPITAL_COMMUNITY): Payer: Self-pay | Admitting: Licensed Clinical Social Worker

## 2020-02-19 DIAGNOSIS — F3162 Bipolar disorder, current episode mixed, moderate: Secondary | ICD-10-CM

## 2020-02-19 NOTE — Progress Notes (Signed)
Virtual Visit via Telephone Note  I connected with Justin [name] on 02/19/20 at  1:30 PM EDT by telephone and verified that I am speaking with the correct person using two identifiers.  Location: Patient: home Provider: office   I discussed the limitations, risks, security and privacy concerns of performing an evaluation and management service by telephone and the availability of in person appointments. I also discussed with the patient that there may be a patient responsible charge related to this service. The patient expressed understanding and agreed to proceed.  Type of Therapy: Individual Therapy   Treatment Goals addressed: "I'm really worried about my sleep issues and how they are effecting my memory and my mind, having more hallucinations, feeling out of it". Justin [name] will improve psychological functioning as evidenced by reduced experience of psychoses, improvement in sleep, and more stabilized mood 3 out of 7 days.   Interventions: CBT, Motivational Interviewing, and psychoeducation   Summary: Justin [name] is a 74 y.o. male who presents with Bipolar 1 Disorder, Mixed, Moderate   Suicidal/Homicidal: No - without intent/plan   Therapist Response: Justin [name] met with clinician for an individual session. Justin [name] discussed his psychiatric symptoms, his current life events and his homework. He reports he is doing okay. He reports ongoing problems getting consistent sleep every night, noting that he had gone to bed last night at about 11 and woke before 4am. Clinician explored sleep routine and noted that there had been some unusual things going on in the house last night that Justin [name] was not aware of, such as the lights and tv still being on when he woke up. Clinician explored sleep walking, as there was a hx of that in the past. Clinician encouraged Justin [name] to keep in contact with Dr. Marguerite Olea nurse about these issues.  Clinician discussed Justin [name]'s continuing issues with his in-laws. Clinician processed  the recent interactions and explored how the father in law competes with Justin [name] about many things, including cars, money, gifts to the grandkids, vacations, Social research officer, government. Clinician offered that due to Justin [name] not being competitive back, the father in law may feel threatened or like he does not measure up to him. Justin [name] agreed about the competitive nature and identified that this is not how he plays. Clinician utilized CBT to assist Justin [name] in developing coping skills so his feeling won't be hurt.    Plan: Return again in 2-3 weeks.    Diagnosis: Axis I: Bipolar 1 Disorder, Mixed, Moderate    I discussed the assessment and treatment plan with the patient. The patient was provided an opportunity to ask questions and all were answered. The patient agreed with the plan and demonstrated an understanding of the instructions.   The patient was advised to call back or seek an in-person evaluation if the symptoms worsen or if the condition fails to improve as anticipated.  I provided 45 minutes of non-face-to-face time during this encounter.   [name], LCSW

## 2020-03-04 ENCOUNTER — Ambulatory Visit (INDEPENDENT_AMBULATORY_CARE_PROVIDER_SITE_OTHER): Payer: Medicare Other | Admitting: Licensed Clinical Social Worker

## 2020-03-04 ENCOUNTER — Other Ambulatory Visit: Payer: Self-pay

## 2020-03-04 ENCOUNTER — Encounter (HOSPITAL_COMMUNITY): Payer: Self-pay | Admitting: Licensed Clinical Social Worker

## 2020-03-04 DIAGNOSIS — F3162 Bipolar disorder, current episode mixed, moderate: Secondary | ICD-10-CM | POA: Diagnosis not present

## 2020-03-04 NOTE — Progress Notes (Signed)
Virtual Visit via Telephone Note  I connected with Justin [name] on 03/04/20 at  3:30 PM EDT by telephone and verified that I am speaking with the correct person using two identifiers.  Location: Patient: home Provider: office   I discussed the limitations, risks, security and privacy concerns of performing an evaluation and management service by telephone and the availability of in person appointments. I also discussed with the patient that there may be a patient responsible charge related to this service. The patient expressed understanding and agreed to proceed.  Type of Therapy: Individual Therapy   Treatment Goals addressed: "I'm really worried about my sleep issues and how they are effecting my memory and my mind, having more hallucinations, feeling out of it". Justin [name] will improve psychological functioning as evidenced by reduced experience of psychoses, improvement in sleep, and more stabilized mood 3 out of 7 days.   Interventions: CBT, Motivational Interviewing, and psychoeducation   Summary: Justin [name] is a 74 y.o. male who presents with Bipolar 1 Disorder, Mixed, Moderate   Suicidal/Homicidal: No - without intent/plan   Therapist Response: Justin [name] met with clinician for an individual session. Justin [name] discussed his psychiatric symptoms, his current life events and his homework. He reports he is doing okay. He reports his sleep has been better now that he is taking 1 Xanax before bed. Clinician processed the impact of this medication and identified no side effects, bad dreams, sleep walking, etc. Clinician validated the encouragement and happiness he feels now that his sleep has improved. Clinician discussed daily activities and reminded Justin [name] to stay hydrated and work outside in the evening, rather than during the morning or afternoon due to the heat. Clinician discussed relationship with son and daughter in law. Clinician noted that even though relationship still has ups and downs, it  appears to be better than in the past few years. Clinician discussed the importance of setting boundaries for himself, as well as respecting boundaries set by son.    Plan: Return again in 2-3 weeks.    Diagnosis: Axis I: Bipolar 1 Disorder, Mixed, Moderate   I discussed the assessment and treatment plan with the patient. The patient was provided an opportunity to ask questions and all were answered. The patient agreed with the plan and demonstrated an understanding of the instructions.   The patient was advised to call back or seek an in-person evaluation if the symptoms worsen or if the condition fails to improve as anticipated.  I provided 45 minutes of non-face-to-face time during this encounter.   [name], LCSW

## 2020-03-18 ENCOUNTER — Encounter (HOSPITAL_COMMUNITY): Payer: Self-pay | Admitting: Licensed Clinical Social Worker

## 2020-03-18 ENCOUNTER — Other Ambulatory Visit: Payer: Self-pay

## 2020-03-18 ENCOUNTER — Ambulatory Visit (INDEPENDENT_AMBULATORY_CARE_PROVIDER_SITE_OTHER): Payer: Medicare Other | Admitting: Licensed Clinical Social Worker

## 2020-03-18 DIAGNOSIS — F3162 Bipolar disorder, current episode mixed, moderate: Secondary | ICD-10-CM

## 2020-03-18 NOTE — Progress Notes (Signed)
Virtual Visit via Telephone Note  I connected with Justin [name] on 03/18/20 at  3:30 PM EDT by telephone and verified that I am speaking with the correct person using two identifiers.  Location: Patient: home Provider: office   I discussed the limitations, risks, security and privacy concerns of performing an evaluation and management service by telephone and the availability of in person appointments. I also discussed with the patient that there may be a patient responsible charge related to this service. The patient expressed understanding and agreed to proceed.  Type of Therapy: Individual Therapy   Treatment Goals addressed: "I'm really worried about my sleep issues and how they are effecting my memory and my mind, having more hallucinations, feeling out of it". Justin [name] will improve psychological functioning as evidenced by reduced experience of psychoses, improvement in sleep, and more stabilized mood 3 out of 7 days.   Interventions: CBT, Motivational Interviewing, and psychoeducation   Summary: Justin [name] is a 74 y.o. male who presents with Bipolar 1 Disorder, Mixed, Moderate   Suicidal/Homicidal: No - without intent/plan   Therapist Response: Yovany met with clinician for an individual session. Gleason discussed his psychiatric symptoms, his current life events and his homework. He reports he is struggling with his relationship with son. Clinician utilized CBT to explore current triggers to uneasiness and hurt feelings by son. Clinician noted that there was some progress in their relationship, as son confided in him about his marriage. Clinician pointed out to [facility] that he was not imagining the changes in his son's interactions with him, he was actually told that his daughter in law's family did not want son to be as involved with Justin [name]. Clinician noted that this was the reason his son was "hot and cold" with him at random times. Clinician encouraged Justin [name] to relax about their  relationship and to feel confident that his son wants a relationship, but due to his wife, this is going to be challenging. Clinician discussed the dog and noted that Justin [name] was very grateful that the dog is in his life. Clinician identified the improvement in [name]'s memory and attention span since having the dog. Also, overall mood has improved.    Plan: Return again in 2-3 weeks.    Diagnosis: Axis I: Bipolar 1 Disorder, Mixed, Moderate  I discussed the assessment and treatment plan with the patient. The patient was provided an opportunity to ask questions and all were answered. The patient agreed with the plan and demonstrated an understanding of the instructions.   The patient was advised to call back or seek an in-person evaluation if the symptoms worsen or if the condition fails to improve as anticipated.  I provided 45 minutes of non-face-to-face time during this encounter.   [name], LCSW

## 2020-04-06 ENCOUNTER — Telehealth (HOSPITAL_COMMUNITY): Payer: Medicare Other | Admitting: Psychiatry

## 2020-04-07 ENCOUNTER — Encounter (HOSPITAL_COMMUNITY): Payer: Self-pay | Admitting: Psychiatry

## 2020-04-07 ENCOUNTER — Telehealth (INDEPENDENT_AMBULATORY_CARE_PROVIDER_SITE_OTHER): Payer: Medicare Other | Admitting: Psychiatry

## 2020-04-07 ENCOUNTER — Other Ambulatory Visit: Payer: Self-pay

## 2020-04-07 DIAGNOSIS — F411 Generalized anxiety disorder: Secondary | ICD-10-CM

## 2020-04-07 DIAGNOSIS — F3162 Bipolar disorder, current episode mixed, moderate: Secondary | ICD-10-CM | POA: Diagnosis not present

## 2020-04-07 DIAGNOSIS — F4310 Post-traumatic stress disorder, unspecified: Secondary | ICD-10-CM | POA: Diagnosis not present

## 2020-04-07 MED ORDER — LAMOTRIGINE 200 MG PO TABS: 200.0000 mg | ORAL_TABLET | Freq: Every day | ORAL | 0 refills | Status: DC

## 2020-04-07 MED ORDER — BENZTROPINE MESYLATE 0.5 MG PO TABS: 0.5000 mg | ORAL_TABLET | Freq: Two times a day (BID) | ORAL | 0 refills | Status: DC

## 2020-04-07 MED ORDER — ARIPIPRAZOLE 20 MG PO TABS: 20.0000 mg | ORAL_TABLET | Freq: Every day | ORAL | 0 refills | Status: DC

## 2020-04-07 MED ORDER — DIAZEPAM 2 MG PO TABS: 2.0000 mg | ORAL_TABLET | Freq: Every day | ORAL | 0 refills | Status: DC

## 2020-04-07 NOTE — Progress Notes (Signed)
Virtual Visit via Telephone Note  I connected with Justin [name] on 04/07/20 at  9:40 AM EDT by telephone and verified that I am speaking with the correct person using two identifiers.  Location: Patient: home Provider: home office   I discussed the limitations, risks, security and privacy concerns of performing an evaluation and management service by telephone and the availability of in person appointments. I also discussed with the patient that there may be a patient responsible charge related to this service. The patient expressed understanding and agreed to proceed.   History of Present Illness: Patient is evaluated by phone session.  He admitted lately not able to sleep and only getting 2 to 3 hours.  On the last visit he requested to go back on Xanax but he feels the Xanax helps to put him on a sleep but he does not stay in sleep all night.  He admitted a lot of family issues going on.  He is not able to see his grandchildren as frequently because having issues with daughter-in-law.  He is in therapy with Janett Billow.  He admitted a lot of ruminative thoughts and there are times when he had negative thoughts then suicidal fleeting thoughts but no plan or any intent.  His energy level is fair.  He is trying to work more at backyard so he kept tired so he can sleep.  Denies any hallucination, paranoia.  He lost few pounds since not have an appetite.  He tried extra Lamictal 25 mg but it made him nightmares so he stopped and only taking 200 mg at bedtime.  Is compliant with Abilify and Cogentin.  In the past we have tried trazodone, Ativan, hydroxyzine for sleep but he did not felt they worked.  He has nightmares, flashbacks.  He denies any psychosis or any hallucination but admitted irritability and mood swings.  He has no rash or any itching.  Past Psychiatric History:Reviewed. H/O suicidal attemptby overdose on Ambientwice, hydroxyzine and alcohol. H/Obipolar disorder with multipleinpatient.  DidIOP. [facility].Tried Zyprexa, Cymbalta, Seroquel, Vistaril, Neurontin,Wellbutrin and triptal.Tried low-dose doxepinandtrazodone.Had a reaction with Ativan and pain medication. SawDr. Letta Moynahan inpast.     Psychiatric Specialty Exam: Physical Exam  Review of Systems  Weight 159 lb (72.1 kg).There is no height or weight on file to calculate BMI.  General Appearance: NA  Eye Contact:  NA  Speech:  Slow  Volume:  Decreased  Mood:  Anxious and Dysphoric  Affect:  NA  Thought Process:  Goal Directed  Orientation:  Full (Time, Place, and Person)  Thought Content:  Rumination  Suicidal Thoughts:  No  Homicidal Thoughts:  No  Memory:  Immediate;   Good Recent;   Good Remote;   Good  Judgement:  Intact  Insight:  Fair  Psychomotor Activity:  NA  Concentration:  Concentration: Fair and Attention Span: Fair  Recall:  Good  Fund of Knowledge:  Good  Language:  Good  Akathisia:  No  Handed:  Right  AIMS (if indicated):     Assets:  Communication Skills Desire for Improvement Housing Resilience  ADL's:  Intact  Cognition:  WNL  Sleep:   poor sleep      Assessment and Plan: Bipolar disorder type I.  Generalized anxiety disorder.  PTSD.  I reviewed his medication.  We will discontinue Xanax since it did not work.  He requested Xanax on the last visit but it did not help as much.  I recommend to  try Valium 2 mg at bedtime to help his insomnia, anxiety and his mood.  I also recommend that he should consider a sleep study since he had tried many medication to help insomnia and so far limited outcome.  He agreed with the plan.  We will try Valium 2 mg at bedtime, decrease Lamictal to 200 mg only since he is not taking 25 mg XR.  Continue Abilify 20 mg daily and Cogentin 0.5 mg twice a day.  Recommended to call us back if is any question or any concern.  Follow-up in 4 weeks.  Follow Up Instructions:    I discussed the  assessment and treatment plan with the patient. The patient was provided an opportunity to ask questions and all were answered. The patient agreed with the plan and demonstrated an understanding of the instructions.   The patient was advised to call back or seek an in-person evaluation if the symptoms worsen or if the condition fails to improve as anticipated.  I provided 20 minutes of non-face-to-face time during this encounter.   Kathlee Nations, MD

## 2020-04-15 ENCOUNTER — Other Ambulatory Visit: Payer: Self-pay

## 2020-04-15 ENCOUNTER — Encounter (HOSPITAL_COMMUNITY): Payer: Self-pay | Admitting: Licensed Clinical Social Worker

## 2020-04-15 ENCOUNTER — Ambulatory Visit (INDEPENDENT_AMBULATORY_CARE_PROVIDER_SITE_OTHER): Payer: Medicare Other | Admitting: Licensed Clinical Social Worker

## 2020-04-15 DIAGNOSIS — F3162 Bipolar disorder, current episode mixed, moderate: Secondary | ICD-10-CM | POA: Diagnosis not present

## 2020-04-15 NOTE — Progress Notes (Signed)
Virtual Visit via Video Note  I connected with Justin [name] on 04/15/20 at  2:30 PM EDT by a video enabled telemedicine application and verified that I am speaking with the correct person using two identifiers.  Location: Patient: home Provider: office   I discussed the limitations of evaluation and management by telemedicine and the availability of in person appointments. The patient expressed understanding and agreed to proceed.  Type of Therapy: Individual Therapy   Treatment Goals addressed: "I'm really worried about my sleep issues and how they are effecting my memory and my mind, having more hallucinations, feeling out of it". Justin [name] will improve psychological functioning as evidenced by reduced experience of psychoses, improvement in sleep, and more stabilized mood 3 out of 7 days.   Interventions: CBT, Motivational Interviewing, and psychoeducation   Summary: Justin [name] is a 74 y.o. male who presents with Bipolar 1 Disorder, Mixed, Moderate   Suicidal/Homicidal: No - without intent/plan   Therapist Response: Justin [name] met with clinician for an individual session. Justin [name] discussed his psychiatric symptoms, his current life events and his homework. He reports he continues to struggle with his relationship with son. Clinician processed experiences using CBT. Clinician processed Justin [name]'s reaction to son's ongoing "hot and cold" reactions with him, noting that it is hard to predict how he will react to anything Justin [name] does. Clinician explored Justin [name]'s options for either reducing contact or reducing the impact of that "hot or cold" reaction. Clinician provided supportive feedback about the cycle of son's bx, exploring possibility that son also has a mood disorder.    Justin [name] reports increase in anxiety, noting that he no longer has a medication to address these anxious feelings during the [name]. He has been taking Valium at night for sleep, but had concern about running out before his next appointment  with Dr. Adele Schilder. Clinician encouraged Justin [name] to communicate with nurse before next appointment if he runs out of medication.   Plan: Return again in 2-3 weeks.    Diagnosis: Axis I: Bipolar 1 Disorder, Mixed, Moderate     I discussed the assessment and treatment plan with the patient. The patient was provided an opportunity to ask questions and all were answered. The patient agreed with the plan and demonstrated an understanding of the instructions.   The patient was advised to call back or seek an in-person evaluation if the symptoms worsen or if the condition fails to improve as anticipated.  I provided 45 minutes of non-face-to-face time during this encounter.   [name], LCSW

## 2020-04-16 ENCOUNTER — Telehealth (HOSPITAL_COMMUNITY): Payer: Self-pay | Admitting: *Deleted

## 2020-04-16 NOTE — Telephone Encounter (Signed)
Pt called stating that the Valium is working at hs, but feels that he needs something for anxiety during the day as he is having increased anxiety since Rx changed ( Xanax to Valium). Pt has an appointment on 05/08/20. Please review.

## 2020-04-16 NOTE — Telephone Encounter (Signed)
He can twice daily. Please call the prescription.

## 2020-04-17 ENCOUNTER — Other Ambulatory Visit (HOSPITAL_COMMUNITY): Payer: Self-pay | Admitting: *Deleted

## 2020-04-17 DIAGNOSIS — F411 Generalized anxiety disorder: Secondary | ICD-10-CM

## 2020-04-17 MED ORDER — DIAZEPAM 2 MG PO TABS: 2.0000 mg | ORAL_TABLET | Freq: Two times a day (BID) | ORAL | 0 refills | Status: DC

## 2020-04-20 ENCOUNTER — Other Ambulatory Visit: Payer: Self-pay

## 2020-04-20 ENCOUNTER — Encounter: Payer: Self-pay | Admitting: Adult Health

## 2020-04-20 ENCOUNTER — Ambulatory Visit: Payer: Medicare Other | Admitting: Adult Health

## 2020-04-20 VITALS — BP 114/82 | HR 58 | Ht 72.0 in | Wt 161.0 lb

## 2020-04-20 DIAGNOSIS — R413 Other amnesia: Secondary | ICD-10-CM | POA: Diagnosis not present

## 2020-04-20 NOTE — Patient Instructions (Signed)
Your Plan:  Continue to monitor memory memory score is stable If your symptoms worsen or you develop new symptoms please let us know.    Thank you for coming to see [place] at [facility]. I hope we have been able to provide you high quality care today.  You may receive a patient satisfaction survey over the next few weeks. We would appreciate your feedback and comments so that we may continue to improve ourselves and the health of our patients.

## 2020-04-20 NOTE — Progress Notes (Signed)
PATIENT: Justin [name] DOB: [birth year]  REASON FOR VISIT: follow up HISTORY FROM: patient  HISTORY OF PRESENT ILLNESS: Today 04/20/20:  Justin [name] is a 74 year old male with a history of initial memory loss. He returns today for follow-up. He did not have neuropsychological testing. The order was placed to an adolescent center and it was benign. Overall though he feels that his memory has gotten better. He states that he is able to complete all ADLs independently. Whereas before he would not be able to remember if he showered. He is managing his own finances without difficulty. He states that initially he had trouble remembering payments. Reports that his sleep has improved with Valium. He continues to live at home alone. But does have family and friends that check on him frequently. He returns today for an evaluation.  HISTORY (Copied from Dr.Dohmeier's note)   Justin [name] presents today as a new patient today on October 15, 2019.  He was seen by his primary care physician on 17 December after his son insisted on an evaluation for worsening memory especially short-term memory.  He has forgotten to switch of the stove burners on the computer, he has left the current thyroid on.  He has had several visits from his son that he cannot recall.  This seems to be quite significant cognitive impairment with amnesia.  He has never been a heavy drinker and had none in the last 6 months.  He has been followed by counselor for PTSD since the death of his wife.  Has history of coronary artery disease but following up but never smoked.  Has a past history of migraine.  He had a vascular stent placement coronary artery disease in August 22, 2010 9 years ago, had a total knee arthroscopy and arthroplasty on August 31, 2018.  Has had elbow surgeries in the 1990s.    Please note that the patient has responded to [place] twice  with amnestic events, performing complex behaviors that he cannot recall.  Looking  at his current medication list and his follow-up psychotropic medication Cogentin, Abilify, Plavix, Lamictal but he is also on Crestor Nitrostat and cardia-diltiazem.  Blood tests last obtained in September 2020 showed an elevated glucose level but I am not sure that the level was obtained fasting.  Sodium was low at 130 potassium was low at 3.4 chloride low at 97 BUN very low at 5.  Creatinine was normal.  HbA1c 4.8 TSH 0.77.   A Mini-Mental status was asked obtained in his primary care physician's office at the time he scored 30 out of 30 today he scored 27 out of 30.  He had a brain MRI that was performed without contrast and compared to a head CT from 2011 which showed brain atrophy that was generalized and considered not age appropriate and appropriate.        REVIEW OF SYSTEMS: Out of a complete 14 system review of symptoms, the patient complains only of the following symptoms, and all other reviewed systems are negative.  See HPI ALLERGIES: Allergies  Allergen Reactions  . Ambien [Zolpidem Tartrate] Other (See Comments)    Blackout, memory issues  . Seroquel [Quetiapine]     SEVERE NIGHTMARES, SLEEPWALK AND NIGHT DRIVE WITH NO RECOLLECTION UPON WAKENING    HOME MEDICATIONS: Outpatient Medications Prior to Visit  Medication Sig Dispense Refill  . ARIPiprazole (ABILIFY) 20 MG tablet Take 1 tablet (20 mg total) by mouth daily. 30 tablet 0  . benztropine (COGENTIN)  0.5 MG tablet Take 1 tablet (0.5 mg total) by mouth 2 (two) times daily. 60 tablet 0  . clopidogrel (PLAVIX) 75 MG tablet TAKE 1 TABLET BY MOUTH DAILY 30 tablet 11  . diazepam (VALIUM) 2 MG tablet Take 1 tablet (2 mg total) by mouth 2 (two) times daily. 60 tablet 0  . diltiazem (CARTIA XT) 180 MG 24 hr capsule Take 1 capsule (180 mg total) by mouth daily. 30 capsule 11  . lamoTRIgine (LAMICTAL) 200 MG tablet Take 1 tablet (200 mg total) by mouth daily. For mood control 30 tablet 0  . nitroGLYCERIN (NITROSTAT) 0.4 MG SL  tablet Place 1 tablet (0.4 mg total) under the tongue every 5 (five) minutes as needed for chest pain. 25 tablet 3  . rosuvastatin (CRESTOR) 20 MG tablet Take 1 tablet by mouth once daily 90 tablet 2   No facility-administered medications prior to visit.    PAST MEDICAL HISTORY: Past Medical History:  Diagnosis Date  . Anxiety and depression   . Arthritis    knees, c-spine // gets lumbar ESI q 6 mos  . Bipolar disorder ([place])   . CAD (coronary artery disease)    [place] 08/2009 >> LHC ([facility]) - mLAD 70, Dx 65 >> PCI:  3x15 mm Endeavor DES to mLAD and 2.5x12 mm Endeavor DES to Dx // S/p NSTEMI >> LHC 8/12 ([facility]) - LM ok, LAD prox and mid 40; LAD stent ok, Dx stent ok, mRCA 30, mLCx 50 >> med Rx // ETT-Echo 2/17 ([facility]): Normal, EF 55-60 at rest  . Chicken pox   . 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 Grief   . History of echocardiogram    Echo 3/19: Mild concentric LVH, EF 60-65, normal wall motion, grade 1 diastolic dysfunction, mild AI, mildly dilated aortic root (40 mm), MAC, mild LAE, atrial septal lipomatous hypertrophy  . History of non-ST elevation myocardial infarction (NSTEMI)   . History of nuclear stress test    Nuclear stress test 3/19: EF 57, inferior/inferoseptal/inferolateral defect consistent with probable soft tissue attenuation (cannot exclude subendocardial scar), no ischemia, intermediate risk >> Echo 3/19 normal EF, normal wall motion  . History of pneumonia 2011  . History of prostatitis 1990s  . Hx of adenomatous colonic polyps 06/22/2016  . Hyperlipidemia   . Hypertension   . Migraines    prior history  . PTSD (post-traumatic stress disorder)     PAST SURGICAL HISTORY: Past Surgical History:  Procedure Laterality Date  . CERVICAL DISCECTOMY    . COLONOSCOPY  03/2017  . ELBOW SURGERY Left   . ELBOW SURGERY Right   . INGUINAL HERNIA REPAIR Bilateral    1996, 1997  . KNEE ARTHROSCOPY Right   . KNEE CARTILAGE SURGERY Left   . NASAL SEPTUM SURGERY      . STENT PLACEMENT VASCULAR ([facility])  09/02/2010  . TONSILLECTOMY    . TOTAL KNEE ARTHROPLASTY Left 08/31/2018   Procedure: TOTAL KNEE ARTHROPLASTY;  Surgeon: Dorna Leitz, MD;  Location: [facility];  Service: Orthopedics;  Laterality: Left;    FAMILY HISTORY: Family History  Problem Relation Age of Onset  . Arthritis Mother   . Heart disease Mother        s/p CABG  . Hypertension Mother   . Alzheimer's disease Mother   . Heart attack Mother   . Arthritis Father   . Hyperlipidemia Father   . Heart disease Father        s/p CABG  .  Stroke Father   . Hypertension Father   . Heart attack Father   . Multiple sclerosis Sister   . Diabetes Sister   . Lung cancer Paternal Grandmother   . Diabetes Sister   . Heart attack Sister   . Pulmonary embolism Sister        died  . Lung cancer Maternal Aunt   . Lung cancer Paternal Aunt   . Depression Brother        suicide    SOCIAL HISTORY: Social History   Socioeconomic History  . Marital status: Widowed    Spouse name: Not on file  . Number of children: 2  . Years of education: 34  . Highest education level: Not on file  Occupational History  . Occupation: Retired  Tobacco Use  . Smoking status: Never Smoker  . Smokeless tobacco: Never Used  Vaping Use  . Vaping Use: Never used  Substance and Sexual Activity  . Alcohol use: No    Alcohol/week: 0.0 standard drinks  . Drug use: No  . Sexual activity: Not Currently  Other Topics Concern  . Not on file  Social History Narrative   Retired, widowed in 2017    1 son / 1 daughter   2 caffeinated beverages daily no alcohol or tobacco   Fun: Work out in the yard.   Denies religious beliefs effecting healthcare.    Worked at [employer] for 30 years   Social Determinants of Radio broadcast assistant Strain:   . Difficulty of Paying Living Expenses: Not on file  Food Insecurity:   . Worried About Charity fundraiser in the Last Year: Not on file  . Ran Out of Food in the  Last Year: Not on file  Transportation Needs:   . Lack of Transportation (Medical): Not on file  . Lack of Transportation (Non-Medical): Not on file  Physical Activity:   . Days of Exercise per Week: Not on file  . Minutes of Exercise per Session: Not on file  Stress:   . Feeling of Stress : Not on file  Social Connections:   . Frequency of Communication with Friends and Family: Not on file  . Frequency of Social Gatherings with Friends and Family: Not on file  . Attends Religious Services: Not on file  . Active Member of Clubs or Organizations: Not on file  . Attends Archivist Meetings: Not on file  . Marital Status: Not on file  Intimate Partner Violence:   . Fear of Current or Ex-Partner: Not on file  . Emotionally Abused: Not on file  . Physically Abused: Not on file  . Sexually Abused: Not on file      PHYSICAL EXAM  Vitals:   04/20/20 1441  BP: 114/82  Pulse: (!) 58  Weight: 161 lb (73 kg)  Height: 6' (1.829 m)   Body mass index is 21.84 kg/m.  Generalized: Well developed, in no acute distress   Neurological examination  Mentation: Alert oriented to time, place, history taking. Follows all commands speech and language fluent Cranial nerve II-XII: Pupils were equal round reactive to light. Extraocular movements were full, visual field were full on confrontational test. Head turning and shoulder shrug  were normal and symmetric. Motor: The motor testing reveals 5 over 5 strength of all 4 extremities. Good symmetric motor tone is noted throughout.  Sensory: Sensory testing is intact to soft touch on all 4 extremities. No evidence of extinction is noted.  Coordination: Cerebellar  testing reveals good finger-nose-finger and heel-to-shin bilaterally.  Gait and station: Gait is normal.  Reflexes: Deep tendon reflexes are symmetric and normal bilaterally.   DIAGNOSTIC DATA (LABS, IMAGING, TESTING) - I reviewed patient records, labs, notes, testing and imaging  myself where available.  Lab Results  Component Value Date   WBC 3.8 (L) 11/21/2019   HGB 12.8 (L) 11/21/2019   HCT 37.7 (L) 11/21/2019   MCV 96.5 11/21/2019   PLT 213.0 11/21/2019      Component Value Date/Time   NA 132 (L) 11/21/2019 1406   K 4.4 11/21/2019 1406   CL 99 11/21/2019 1406   CO2 27 11/21/2019 1406   GLUCOSE 93 11/21/2019 1406   BUN 10 11/21/2019 1406   CREATININE 0.79 11/21/2019 1406   CALCIUM 9.1 11/21/2019 1406   PROT 6.3 11/21/2019 1406   PROT 6.4 10/15/2019 1543   ALBUMIN 4.2 11/21/2019 1406   AST 53 (H) 11/21/2019 1406   ALT 31 11/21/2019 1406   ALKPHOS 67 11/21/2019 1406   BILITOT 0.9 11/21/2019 1406   GFRNONAA >60 08/27/2019 2039   GFRAA >60 08/27/2019 2039   Lab Results  Component Value Date   CHOL 126 11/21/2019   HDL 81.20 11/21/2019   LDLCALC 38 11/21/2019   TRIG 37.0 11/21/2019   CHOLHDL 2 11/21/2019   Lab Results  Component Value Date   HGBA1C 4.3 (L) 11/21/2019   Lab Results  Component Value Date   VITAMINB12 248 05/15/2019   Lab Results  Component Value Date   TSH 0.56 11/21/2019      ASSESSMENT AND PLAN 74 y.o. 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 here with:  Amnesia memory loss   MMSE 30/30  Memory score and symptoms have improved  Patient does not want to do any further testing  Will follow up if symptoms worsen or he develops new symptoms   I spent 20 minutes of face-to-face and non-face-to-face time with patient.  This included previsit chart review, lab review, study  review, order entry, electronic health record documentation, patient education.  Ward Givens, MSN, NP-C 04/20/2020, 3:01 PM [name] [address], [place], [place] [address] [phone]

## 2020-04-29 ENCOUNTER — Encounter (HOSPITAL_COMMUNITY): Payer: Self-pay | Admitting: Licensed Clinical Social Worker

## 2020-04-29 ENCOUNTER — Other Ambulatory Visit: Payer: Self-pay

## 2020-04-29 ENCOUNTER — Ambulatory Visit (INDEPENDENT_AMBULATORY_CARE_PROVIDER_SITE_OTHER): Payer: Medicare Other | Admitting: Licensed Clinical Social Worker

## 2020-04-29 DIAGNOSIS — F3162 Bipolar disorder, current episode mixed, moderate: Secondary | ICD-10-CM | POA: Diagnosis not present

## 2020-04-29 NOTE — Progress Notes (Signed)
Virtual Visit via Telephone Note  I connected with Justin [name] on 04/29/20 at  2:30 PM EDT by telephone and verified that I am speaking with the correct person using two identifiers.  Location: Patient: home Provider: office   I discussed the limitations, risks, security and privacy concerns of performing an evaluation and management service by telephone and the availability of in person appointments. I also discussed with the patient that there may be a patient responsible charge related to this service. The patient expressed understanding and agreed to proceed.  Type of Therapy: Individual Therapy   Treatment Goals addressed: "I'm really worried about my sleep issues and how they are effecting my memory and my mind, having more hallucinations, feeling out of it". Justin [name] will improve psychological functioning as evidenced by reduced experience of psychoses, improvement in sleep, and more stabilized mood 3 out of 7 days.   Interventions: CBT, Motivational Interviewing, and psychoeducation   Summary: Justin [name] is a 74 y.o. male who presents with Bipolar 1 Disorder, Mixed, Moderate   Suicidal/Homicidal: No - without intent/plan   Therapist Response: Gemini met with clinician for an individual session. Osias discussed his psychiatric symptoms, his current life events and his homework. He reports he has been doing better with his son since last session. Clinician reflected increased interactions and more positive sharing of affection between the two men. Clinician offered support in the uncertainty of his son's attitude, noting that he has no control over his son's patterns, but that he is doing better at shielding himself from the negativity if present. Clinician explored outcomes of recent neurology appointment. Clinician noted improvement in memory and increased stability of medication, sleep, anxiety, and insecurity. Justin [name] continued to credit his improvement to his dog, who keeps him  active, provides company, and also has become emotionally supportive. Clinician discussed sleep issues and identified the importance and improvement in sleep with having a set bed time and wake time.    Plan: Return again in 2-3 weeks.    Diagnosis: Axis I: Bipolar 1 Disorder, Mixed, Moderate    I discussed the assessment and treatment plan with the patient. The patient was provided an opportunity to ask questions and all were answered. The patient agreed with the plan and demonstrated an understanding of the instructions.   The patient was advised to call back or seek an in-person evaluation if the symptoms worsen or if the condition fails to improve as anticipated.  I provided 50 minutes of non-face-to-face time during this encounter.   Justin Curling, LCSW

## 2020-05-08 ENCOUNTER — Telehealth (INDEPENDENT_AMBULATORY_CARE_PROVIDER_SITE_OTHER): Payer: Medicare Other | Admitting: Psychiatry

## 2020-05-08 ENCOUNTER — Encounter (HOSPITAL_COMMUNITY): Payer: Self-pay | Admitting: Psychiatry

## 2020-05-08 ENCOUNTER — Other Ambulatory Visit: Payer: Self-pay

## 2020-05-08 ENCOUNTER — Telehealth: Payer: Self-pay | Admitting: Internal Medicine

## 2020-05-08 DIAGNOSIS — F411 Generalized anxiety disorder: Secondary | ICD-10-CM

## 2020-05-08 DIAGNOSIS — F3162 Bipolar disorder, current episode mixed, moderate: Secondary | ICD-10-CM

## 2020-05-08 DIAGNOSIS — F4310 Post-traumatic stress disorder, unspecified: Secondary | ICD-10-CM | POA: Diagnosis not present

## 2020-05-08 MED ORDER — ARIPIPRAZOLE 15 MG PO TABS: 15.0000 mg | ORAL_TABLET | Freq: Every day | ORAL | 1 refills | Status: DC

## 2020-05-08 MED ORDER — DIAZEPAM 2 MG PO TABS: ORAL_TABLET | ORAL | 1 refills | Status: DC

## 2020-05-08 MED ORDER — LAMOTRIGINE 200 MG PO TABS: 200.0000 mg | ORAL_TABLET | Freq: Every day | ORAL | 1 refills | Status: DC

## 2020-05-08 MED ORDER — BENZTROPINE MESYLATE 0.5 MG PO TABS: 0.5000 mg | ORAL_TABLET | Freq: Every day | ORAL | 0 refills | Status: DC

## 2020-05-08 NOTE — Progress Notes (Signed)
Virtual Visit via Telephone Note  I connected with Justin [name] on 05/08/20 at  9:40 AM EDT by telephone and verified that I am speaking with the correct person using two identifiers.  Location: Patient: home Provider: home office   I discussed the limitations, risks, security and privacy concerns of performing an evaluation and management service by telephone and the availability of in person appointments. I also discussed with the patient that there may be a patient responsible charge related to this service. The patient expressed understanding and agreed to proceed.   History of Present Illness: Patient is evaluated by phone session.  He is doing much better with Valium and he is sleeping better.  He feels his manic cycles are better controlled with Valium.  He is taking Abilify, Lamictal and Cogentin when he feels that his tremors are worse.  He also reported improvement in the relationship with his son and he had talked to his son yesterday.  He also able to see very briefly his grandchildren.  His granddaughter 16th birthday was recently and he also saw briefly his younger grandchild.  He is hoping things continue to get better slowly and gradually.  He recently seen neurologist and he was pleased that his memory is improved.  I reviewed the chart his Mini-Mental score is improved.  He also endorsed improvement in his attention, concentration.  Denies any panic attack but he does take some time Valium during the day when he feels very nervous and anxious.  He is in therapy with Janett Billow.  He endorsed improvement in his appetite but there is no significant weight change.  He has no rash, itching tremors or shakes.  Since he is sleeping better he denies any nightmares or flashbacks.  Past Psychiatric History:Reviewed. H/O suicidal attemptby overdose on Ambientwice, hydroxyzine and alcohol. H/Obipolar disorder with multipleinpatient. DidIOP. LastER visit Sept 2020 and[facility]patient[facility].Tried Zyprexa, Cymbalta, Seroquel, Vistaril, Neurontin,Wellbutrin and triptal.Tried low-dose doxepinandtrazodone.Had a reaction with Ativan and pain medication. SawDr. Letta Moynahan inpast.   Psychiatric Specialty Exam: Physical Exam  Review of Systems  There were no vitals taken for this visit.There is no height or weight on file to calculate BMI.  General Appearance: NA  Eye Contact:  NA  Speech:  Slow  Volume:  Decreased  Mood:  Dysphoric  Affect:  NA  Thought Process:  Descriptions of Associations: Intact  Orientation:  Full (Time, Place, and Person)  Thought Content:  Rumination  Suicidal Thoughts:  No  Homicidal Thoughts:  No  Memory:  Immediate;   Good Recent;   Good Remote;   Good  Judgement:  Intact  Insight:  Present  Psychomotor Activity:  NA  Concentration:  Concentration: Fair and Attention Span: Fair  Recall:  Good  Fund of Knowledge:  Good  Language:  Good  Akathisia:  No  Handed:  Right  AIMS (if indicated):     Assets:  Communication Skills Desire for Improvement Housing Transportation  ADL's:  Intact  Cognition:  WNL  Sleep:   much better      Assessment and Plan: Bipolar disorder type I.  Generalized anxiety disorder.  PTSD.  Patient doing better since we added Valium 2 mg at bedtime.  He also takes sometimes during the day when he feels very nervous and anxious.  He has no rash or any itching.  I reviewed notes from neurology and his Mini-Mental score is improved.  He talked about cutting down the Abilify since he is feeling much better  and he agreed to give a try.  He would reduce Abilify from 20mg  to 15 mg however I explained if he feel the symptoms are coming back and he noticed more irritability that he need to call us immediately.  Continue Lamictal 200 mg daily and Cogentin 0.5 mg daily as needed for tremors.  He will take the Valium 2 mg at bedtime and second during the day if needed for severe anxiety.  Encouraged to  continue therapy with Janett Billow.  Recommended to call us back if is any question or any concern.  Follow-up in 2 months.  Follow Up Instructions:    I discussed the assessment and treatment plan with the patient. The patient was provided an opportunity to ask questions and all were answered. The patient agreed with the plan and demonstrated an understanding of the instructions.   The patient was advised to call back or seek an in-person evaluation if the symptoms worsen or if the condition fails to improve as anticipated.  I provided 22 minutes of non-face-to-face time during this encounter.   Kathlee Nations, MD

## 2020-05-08 NOTE — Telephone Encounter (Signed)
   Patient wants to know if he needs pneumonia vaccine this year. Patient scheduled for flu vaccine on 9/23

## 2020-05-12 NOTE — Telephone Encounter (Signed)
He does not need a pneumonia vaccine

## 2020-05-13 ENCOUNTER — Encounter (HOSPITAL_COMMUNITY): Payer: Self-pay | Admitting: Licensed Clinical Social Worker

## 2020-05-13 ENCOUNTER — Other Ambulatory Visit: Payer: Self-pay

## 2020-05-13 ENCOUNTER — Ambulatory Visit (INDEPENDENT_AMBULATORY_CARE_PROVIDER_SITE_OTHER): Payer: Medicare Other | Admitting: Licensed Clinical Social Worker

## 2020-05-13 DIAGNOSIS — F3162 Bipolar disorder, current episode mixed, moderate: Secondary | ICD-10-CM | POA: Diagnosis not present

## 2020-05-13 NOTE — Progress Notes (Signed)
Virtual Visit via Telephone Note  I connected with Justin [name] on 05/13/20 at  2:30 PM EDT by telephone and verified that I am speaking with the correct person using two identifiers.  Location: Patient: home Provider: office   I discussed the limitations, risks, security and privacy concerns of performing an evaluation and management service by telephone and the availability of in person appointments. I also discussed with the patient that there may be a patient responsible charge related to this service. The patient expressed understanding and agreed to proceed.  Type of Therapy: Individual Therapy   Treatment Goals addressed: "I'm really worried about my sleep issues and how they are effecting my memory and my mind, having more hallucinations, feeling out of it". Justin [name] will improve psychological functioning as evidenced by reduced experience of psychoses, improvement in sleep, and more stabilized mood 3 out of 7 days.   Interventions: CBT, Motivational Interviewing, and psychoeducation   Summary: Justin [name] is a 74 y.o. male who presents with Bipolar 1 Disorder, Mixed, Moderate   Suicidal/Homicidal: No - without intent/plan   Therapist Response: Qasim met with clinician for an individual session. Justin [name] discussed his psychiatric symptoms, his current life events and his homework. He reports that he continues to build his relationship with his son, but it remains insecure. Clinician processed thoughts and feelings about the relationship using CBT. Clinician reflected that even though he wants to say no to son's requests, he will often agree because he feels like his relationship depends on it. Clinician provided options to either change things or leave them the same. Clinician discussed interactions with grandchildren and identified the dissatisfaction with not being able to spend much time with them. Clinician explored ways to invite the girls to make a cake or to walk the dog together.     Plan: Return again in 2-3 weeks.    Diagnosis: Axis I: Bipolar 1 Disorder, Mixed, Moderate  I discussed the assessment and treatment plan with the patient. The patient was provided an opportunity to ask questions and all were answered. The patient agreed with the plan and demonstrated an understanding of the instructions.   The patient was advised to call back or seek an in-person evaluation if the symptoms worsen or if the condition fails to improve as anticipated.  I provided 45 minutes of non-face-to-face time during this encounter.   [name], LCSW

## 2020-05-13 NOTE — Telephone Encounter (Signed)
LDVM of Dr. Quay Burow note about pts Pneumonia vaccine.

## 2020-05-14 ENCOUNTER — Telehealth (HOSPITAL_COMMUNITY): Payer: Self-pay | Admitting: *Deleted

## 2020-05-14 ENCOUNTER — Ambulatory Visit: Payer: Medicare Other

## 2020-05-14 IMAGING — CT CT HEAD W/O CM
3 series · 14 of 47 positions shown, 16 images · non-contrast
Comparison: CT brain 04/04/2013

CLINICAL DATA: Altered LOC

EXAM:
CT HEAD WITHOUT CONTRAST
TECHNIQUE: Contiguous axial images were obtained from the base of the skull
through the vertex without intravenous contrast.

[Series 3: head 5.0 h30s · axial · 0.46mm/px · z∈[-81,+44]mm · 8 of 31 slices shown, 10 images]
[im 3/31  brain]
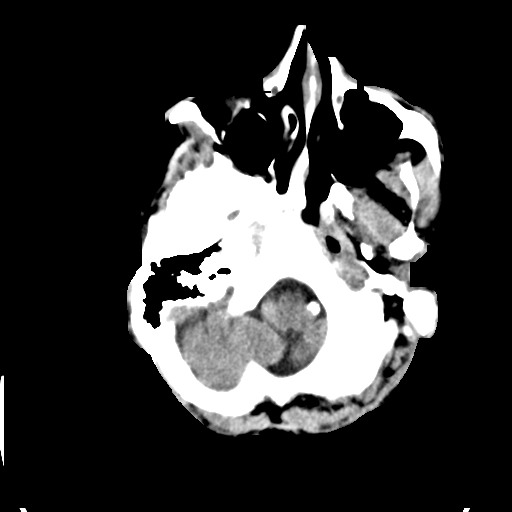
[im 3/31  bone]
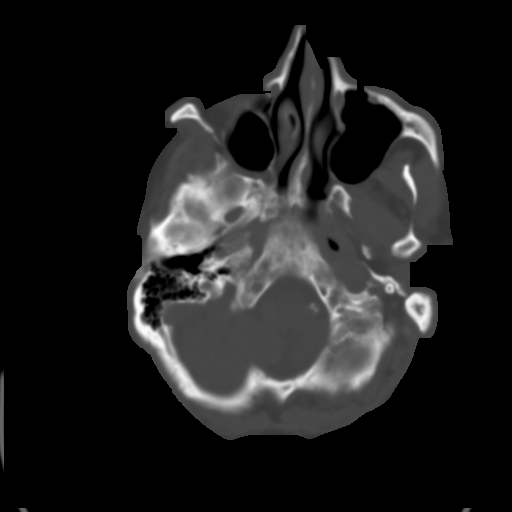
[im 7/31  brain]
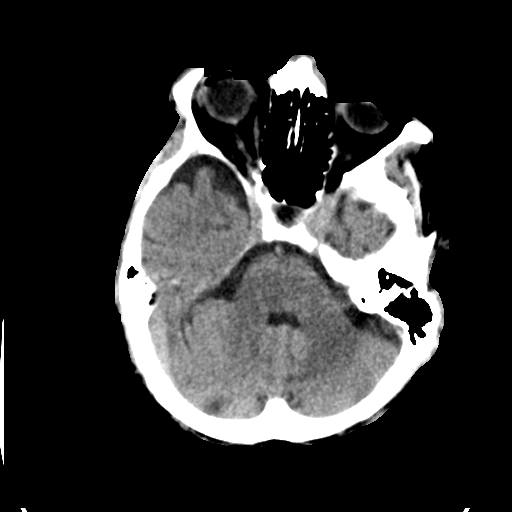
[im 10/31  brain]
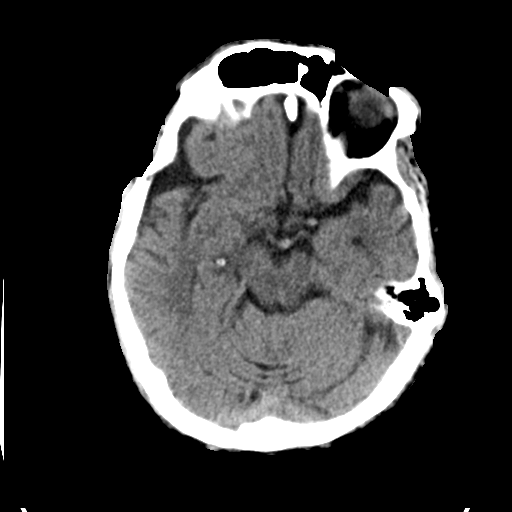
[im 14/31  brain]
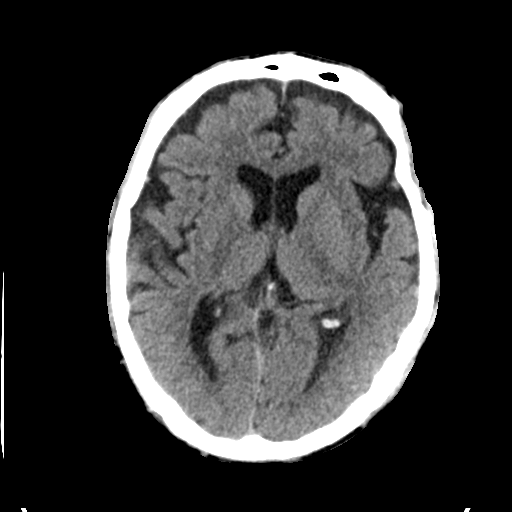
[im 17/31  brain]
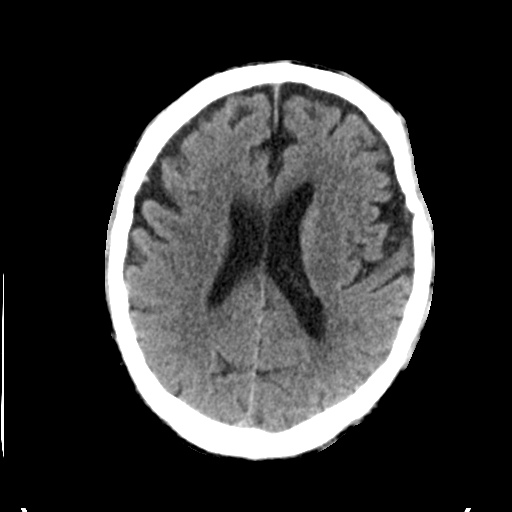
[im 17/31  bone]
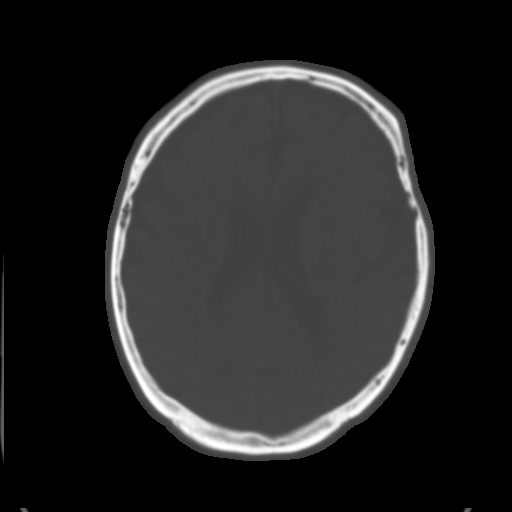
[im 21/31  brain]
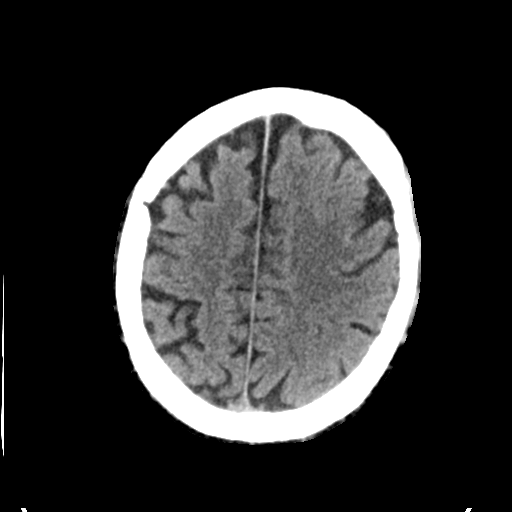
[im 24/31  brain]
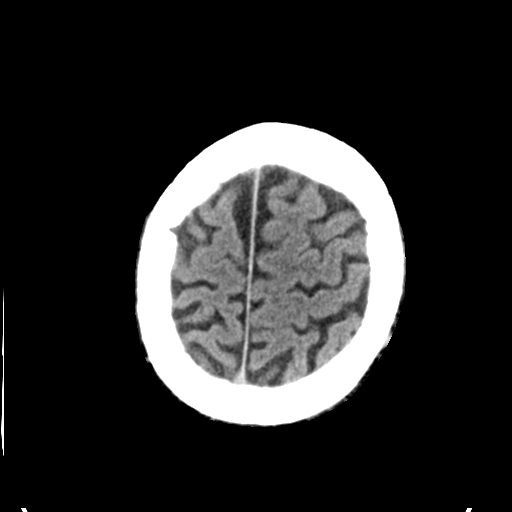
[im 28/31  brain]
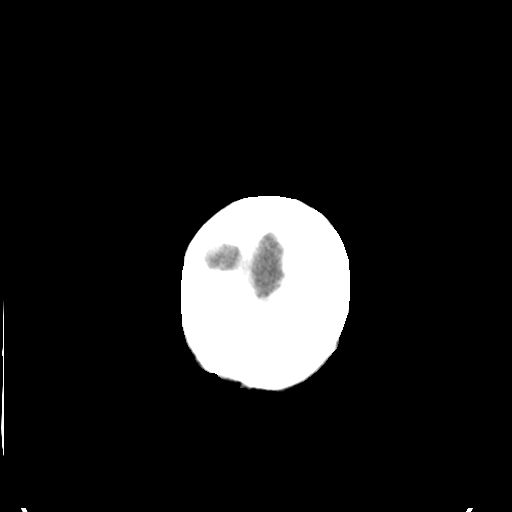

[Series 5: head 3.0 mpr cor · coronal · 0.30mm/px · 3 of 67 slices shown]
[im 23/67  brain]
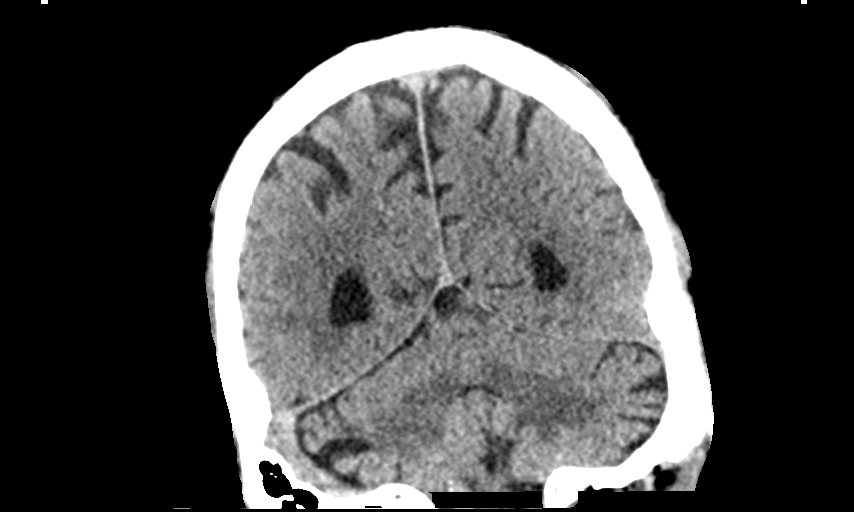
[im 30/67  brain]
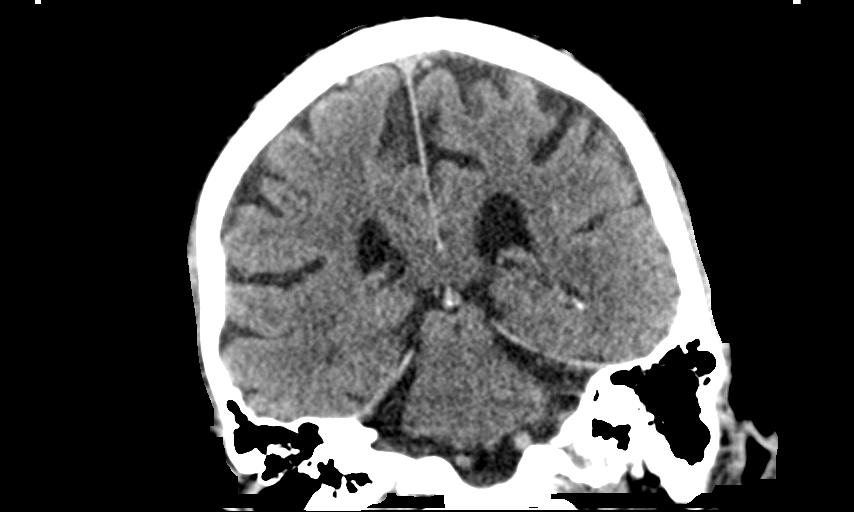
[im 37/67  brain]
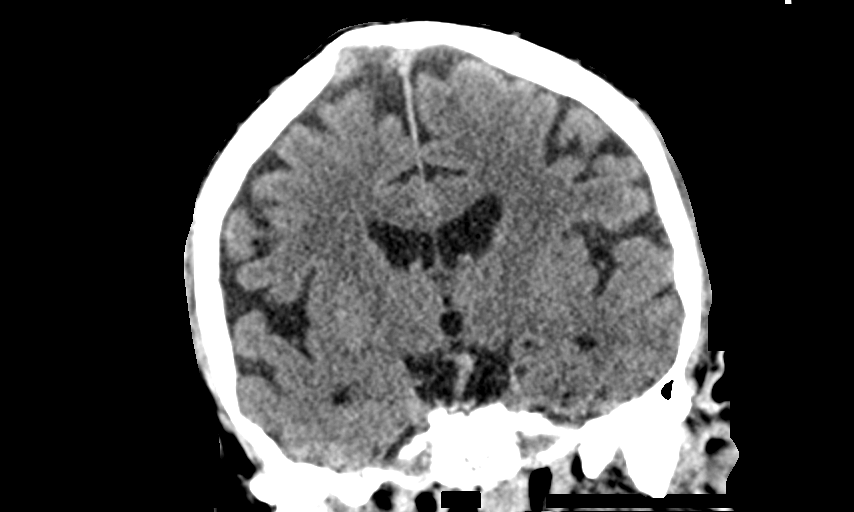

[Series 6: head 3.0 mpr sag · sagittal · 0.30mm/px · 3 of 65 slices shown]
[im 22/65  brain]
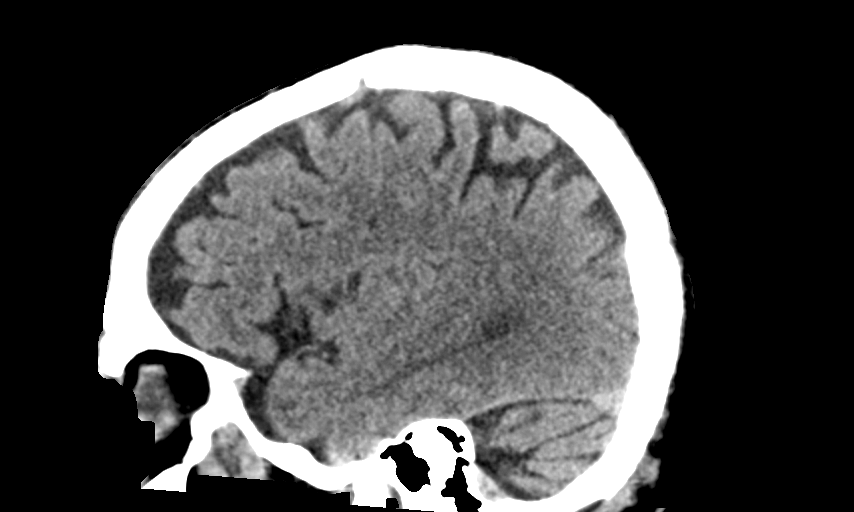
[im 33/65  brain]
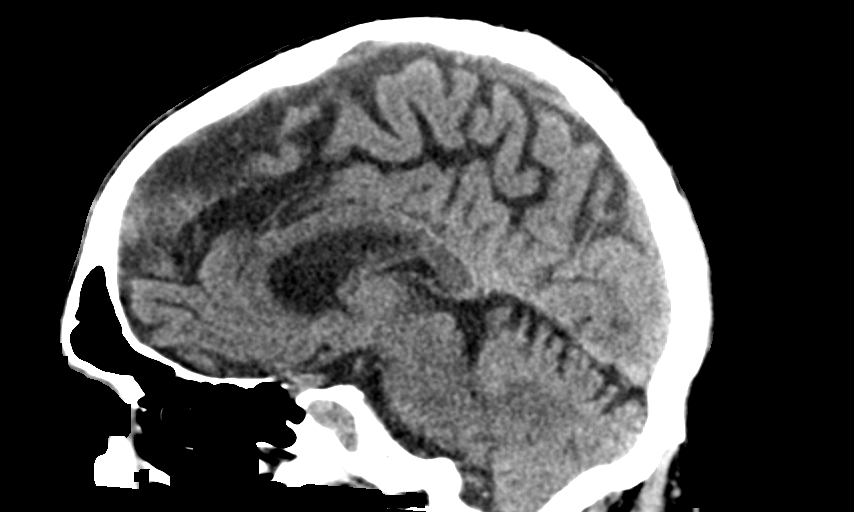
[im 43/65  brain]
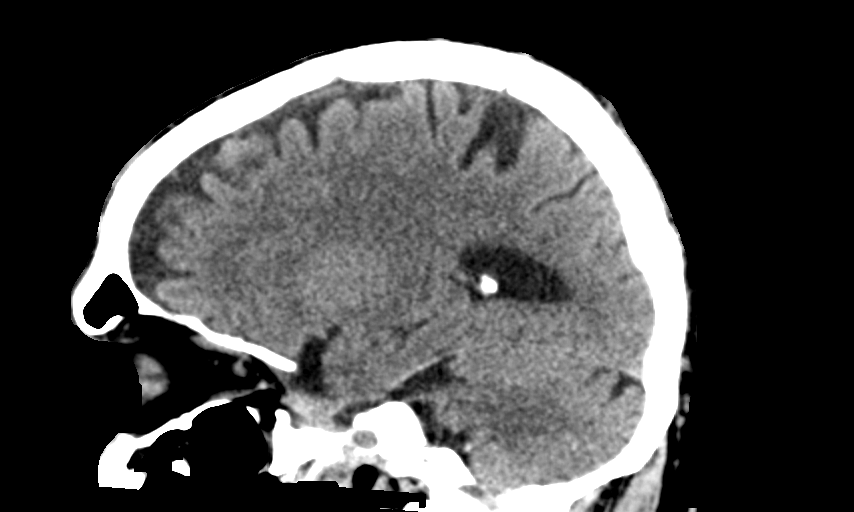

[14 of 47 positions shown; findings below may reference images not displayed]

FINDINGS: Brain: No acute territorial infarction, hemorrhage, or intracranial
mass. Atrophy. Stable ventricle size.

Vascular: No hyperdense vessels.  Vertebral artery calcification

Skull: Normal. Negative for fracture or focal lesion.

Sinuses/Orbits: No acute finding.

Other: None
IMPRESSION: No CT evidence for acute intracranial abnormality.  Atrophy

## 2020-05-14 NOTE — Telephone Encounter (Signed)
PA approved for the Cogentin 0.5mg . PA reference # [id number], approved through 08/21/20.

## 2020-05-18 ENCOUNTER — Other Ambulatory Visit: Payer: Self-pay | Admitting: Physician Assistant

## 2020-05-20 ENCOUNTER — Telehealth (HOSPITAL_COMMUNITY): Payer: Self-pay | Admitting: *Deleted

## 2020-05-20 NOTE — Telephone Encounter (Signed)
I spoke to him and clarified that he should not be taking 2 tablet every day to avoid tolerance.  He will take 1 tablet daily and second if needed but he will not take every day to a day.  He acknowledged and agreed.  We will discuss more on his next appointment.

## 2020-05-20 NOTE — Telephone Encounter (Signed)
Patient called and wanted to know why he only got a refill of Xanax for 45 tabs when he takes 2 a day. He was upset about this. He has some for now. Just wanted you to review and advise me what to tell him.

## 2020-05-20 NOTE — Telephone Encounter (Signed)
Medication is Valium not Xanax. Sorry.

## 2020-05-27 ENCOUNTER — Ambulatory Visit: Payer: Medicare Other

## 2020-06-02 ENCOUNTER — Other Ambulatory Visit: Payer: Self-pay | Admitting: Physician Assistant

## 2020-06-09 ENCOUNTER — Ambulatory Visit (INDEPENDENT_AMBULATORY_CARE_PROVIDER_SITE_OTHER): Payer: Medicare Other | Admitting: Licensed Clinical Social Worker

## 2020-06-09 ENCOUNTER — Other Ambulatory Visit: Payer: Self-pay

## 2020-06-09 DIAGNOSIS — F3162 Bipolar disorder, current episode mixed, moderate: Secondary | ICD-10-CM

## 2020-06-10 ENCOUNTER — Encounter (HOSPITAL_COMMUNITY): Payer: Self-pay | Admitting: Licensed Clinical Social Worker

## 2020-06-10 NOTE — Progress Notes (Signed)
Virtual Visit via Telephone Note  I connected with [name] on 06/10/20 at  9:00 AM EDT by telephone and verified that I am speaking with the correct person using two identifiers.  Location: Patient: home Provider: office   I discussed the limitations, risks, security and privacy concerns of performing an evaluation and management service by telephone and the availability of in person appointments. I also discussed with the patient that there may be a patient responsible charge related to this service. The patient expressed understanding and agreed to proceed.  Type of Therapy: Individual Therapy   Treatment Goals addressed: "I'm really worried about my sleep issues and how they are effecting my memory and my mind, having more hallucinations, feeling out of it". [name] will improve psychological functioning as evidenced by reduced experience of psychoses, improvement in sleep, and more stabilized mood 3 out of 7 days.   Interventions: CBT, Motivational Interviewing, and psychoeducation   Summary: Derrell Milanes is a 74 y.o. male who presents with Bipolar 1 Disorder, Mixed, Moderate   Suicidal/Homicidal: No - without intent/plan   Therapist Response: Dixon met with clinician for an individual session. Ryder discussed his psychiatric symptoms, his current life events and his homework. He reports ongoing struggle with his up and down relationship with son and daughter in law. Clinician processed thoughts and feelings using CBT. Clinician challenged [name] to identify his expectation of his relationship with son. Clinician identified what is reasonable vs what is too much or not enough. Clinician explored options for [name] to open up his communication to son about what he expects or his comfort level of visiting their home. [name] reports that he continues to feel that his son takes advantage of him sometimes. However, [name] also reports that his son helps him out at times, too.  [name] continues to  mourn the loss of his wife. Clinician utilized MI OARS to reflect and summarize thoughts and feelings. Clinician provided psychoeducation about his expectations of the grief process and validated that it never truly ends, but each day gets a little easier.    Plan: Return again in 2-3 weeks.    Diagnosis: Axis I: Bipolar 1 Disorder, Mixed, Moderate    I discussed the assessment and treatment plan with the patient. The patient was provided an opportunity to ask questions and all were answered. The patient agreed with the plan and demonstrated an understanding of the instructions.   The patient was advised to call back or seek an in-person evaluation if the symptoms worsen or if the condition fails to improve as anticipated.  I provided 45 minutes of non-face-to-face time during this encounter.   [name], LCSW

## 2020-06-24 ENCOUNTER — Ambulatory Visit (INDEPENDENT_AMBULATORY_CARE_PROVIDER_SITE_OTHER): Payer: Medicare Other | Admitting: Licensed Clinical Social Worker

## 2020-06-24 ENCOUNTER — Other Ambulatory Visit: Payer: Self-pay

## 2020-06-24 DIAGNOSIS — F3162 Bipolar disorder, current episode mixed, moderate: Secondary | ICD-10-CM | POA: Diagnosis not present

## 2020-06-25 ENCOUNTER — Encounter (HOSPITAL_COMMUNITY): Payer: Self-pay | Admitting: Licensed Clinical Social Worker

## 2020-06-25 NOTE — Progress Notes (Signed)
Virtual Visit via Telephone Note  I connected with [name] on 06/25/20 at  2:30 PM EDT by telephone and verified that I am speaking with the correct person using two identifiers.  Location: Patient: home Provider: office   I discussed the limitations, risks, security and privacy concerns of performing an evaluation and management service by telephone and the availability of in person appointments. I also discussed with the patient that there may be a patient responsible charge related to this service. The patient expressed understanding and agreed to proceed.  Type of Therapy: Individual Therapy   Treatment Goals addressed: "I'm really worried about my sleep issues and how they are effecting my memory and my mind, having more hallucinations, feeling out of it". [name] will improve psychological functioning as evidenced by reduced experience of psychoses, improvement in sleep, and more stabilized mood 3 out of 7 days.   Interventions: CBT, Motivational Interviewing, and psychoeducation   Summary: Perkins Molina is a 74 y.o. male who presents with Bipolar 1 Disorder, Mixed, Moderate   Suicidal/Homicidal: No - without intent/plan   Therapist Response: Huriel met with clinician for an individual session. Rahsaan discussed his psychiatric symptoms, his current life events and his homework. He reports ongoing struggle with his up and down relationship with son and daughter in law. Clinician processed thoughts and feelings using CBT. Clinician continued to  challenge [name] to identify his expectation of his relationship with son. Clinician encouraged [name] to lead the conversation about the schedule of the grandchildren's games, days when he will be needed to babysit, and other activities. Clinician validated [name]'s concern about son becoming angry and cutting him off. However, clinician also challenged [name] to communicate a bit more openly about the importance of scheduling to reduce anxiety.    Plan: Return again in 2-3 weeks.    Diagnosis: Axis I: Bipolar 1 Disorder, Mixed, Moderate   I discussed the assessment and treatment plan with the patient. The patient was provided an opportunity to ask questions and all were answered. The patient agreed with the plan and demonstrated an understanding of the instructions.   The patient was advised to call back or seek an in-person evaluation if the symptoms worsen or if the condition fails to improve as anticipated.  I provided 45 minutes of non-face-to-face time during this encounter.   [name], LCSW

## 2020-07-08 ENCOUNTER — Encounter (HOSPITAL_COMMUNITY): Payer: Self-pay | Admitting: Psychiatry

## 2020-07-08 ENCOUNTER — Telehealth (INDEPENDENT_AMBULATORY_CARE_PROVIDER_SITE_OTHER): Payer: Medicare Other | Admitting: Psychiatry

## 2020-07-08 ENCOUNTER — Other Ambulatory Visit: Payer: Self-pay

## 2020-07-08 DIAGNOSIS — F3162 Bipolar disorder, current episode mixed, moderate: Secondary | ICD-10-CM | POA: Diagnosis not present

## 2020-07-08 DIAGNOSIS — F4310 Post-traumatic stress disorder, unspecified: Secondary | ICD-10-CM

## 2020-07-08 DIAGNOSIS — F411 Generalized anxiety disorder: Secondary | ICD-10-CM

## 2020-07-08 MED ORDER — ARIPIPRAZOLE 15 MG PO TABS: 15.0000 mg | ORAL_TABLET | Freq: Every day | ORAL | 1 refills | Status: DC

## 2020-07-08 MED ORDER — BENZTROPINE MESYLATE 0.5 MG PO TABS: 0.5000 mg | ORAL_TABLET | Freq: Every day | ORAL | 0 refills | Status: DC

## 2020-07-08 MED ORDER — LAMOTRIGINE 200 MG PO TABS: 200.0000 mg | ORAL_TABLET | Freq: Every day | ORAL | 1 refills | Status: DC

## 2020-07-08 MED ORDER — DIAZEPAM 2 MG PO TABS: ORAL_TABLET | ORAL | 1 refills | Status: DC

## 2020-07-08 NOTE — Progress Notes (Signed)
Virtual Visit via Telephone Note  I connected with Justin [name] on 07/08/20 at  9:40 AM EST by telephone and verified that I am speaking with the correct person using two identifiers.  Location: Patient: Home Provider: Home Office   I discussed the limitations, risks, security and privacy concerns of performing an evaluation and management service by telephone and the availability of in person appointments. I also discussed with the patient that there may be a patient responsible charge related to this service. The patient expressed understanding and agreed to proceed.   History of Present Illness: Patient is evaluated by phone session.  He is taking his medication as prescribed.  Sometimes he feels that Valium is too strong and take half tablet in the morning because it makes him sleepy.  Like to go back on Xanax however we discussed that he did tried Xanax in the past and it was not helping him sleep.  He endorsed that Valium helping him sleep at night.  Overall he feels things are going well.  Denies any crying spells, feeling of hopelessness or any suicidal thoughts.  He is in therapy with Janett Billow.  He is taking Cogentin, Abilify and Lamictal.  He is happy that his sister invited him for Thanksgiving. Christmases difficult.  He think about his wife.  Patient has no tremors, shakes or any EPS.  His nightmares and flashbacks are not as strong.  He denies any mania, psychosis or any hallucination.  He denies any impulsive behavior.  Past Psychiatric History:Reviewed. H/O suicidal attemptby overdose on Ambientwice, hydroxyzine and alcohol. H/Obipolar disorder with multipleinpatient. DidIOP. LastER visit Sept 2020 and[facility]patient[facility].Tried Zyprexa, Cymbalta, Seroquel, Vistaril, Neurontin,Wellbutrin and triptal.Tried low-dose doxepinandtrazodone.Had a reaction with Ativan and pain medication. SawDr. Letta Moynahan inpast.   Psychiatric Specialty Exam: Physical  Exam  Review of Systems  Weight 167 lb (75.8 kg).There is no height or weight on file to calculate BMI.  General Appearance: NA  Eye Contact:  NA  Speech:  Normal Rate  Volume:  Normal  Mood:  Euthymic  Affect:  NA  Thought Process:  Goal Directed  Orientation:  Full (Time, Place, and Person)  Thought Content:  WDL  Suicidal Thoughts:  No  Homicidal Thoughts:  No  Memory:  Immediate;   Good Recent;   Good Remote;   Good  Judgement:  Intact  Insight:  Present  Psychomotor Activity:  NA  Concentration:  Concentration: Fair and Attention Span: Fair  Recall:  Good  Fund of Knowledge:  Good  Language:  Good  Akathisia:  No  Handed:  Right  AIMS (if indicated):     Assets:  Communication Skills Desire for Improvement Housing Resilience  ADL's:  Intact  Cognition:  WNL  Sleep:   ok      Assessment and Plan: Bipolar disorder type I.  PTSD.  Generalized anxiety disorder.  Discussed difference between Xanax and Valium.  Due to longer half-life and back pain he wants to continue Valium.  I recommend if it is too strong then he should take half tablet in the morning.  He agreed with the plan.  He does not want to reduce further Abilify since it is working well.  His attention and concentration is better since the Abilify dose reduced.  He has no rash or any itching.  Continue Valium 2 mg at bedtime and 1 mg during the day if needed for anxiety, continue lamotrigine 200 mg daily and Cogentin 0.5 mg for tremors.  Discussed medication  side effects and benefits.  Encouraged to keep appointment with Janett Billow.  Follow-up in 3 months.  Recommended to call us back if there is any question or any concern.  Follow Up Instructions:    I discussed the assessment and treatment plan with the patient. The patient was provided an opportunity to ask questions and all were answered. The patient agreed with the plan and demonstrated an understanding of the instructions.   The patient was advised to  call back or seek an in-person evaluation if the symptoms worsen or if the condition fails to improve as anticipated.  I provided 21 minutes of non-face-to-face time during this encounter.   Kathlee Nations, MD

## 2020-07-09 ENCOUNTER — Ambulatory Visit (INDEPENDENT_AMBULATORY_CARE_PROVIDER_SITE_OTHER): Payer: Medicare Other | Admitting: Licensed Clinical Social Worker

## 2020-07-09 ENCOUNTER — Other Ambulatory Visit: Payer: Self-pay

## 2020-07-09 DIAGNOSIS — F3162 Bipolar disorder, current episode mixed, moderate: Secondary | ICD-10-CM

## 2020-07-13 ENCOUNTER — Encounter (HOSPITAL_COMMUNITY): Payer: Self-pay | Admitting: Licensed Clinical Social Worker

## 2020-07-13 NOTE — Progress Notes (Signed)
Virtual Visit via Telephone Note  I connected with [name] on 07/13/20 at  2:30 PM EST by telephone and verified that I am speaking with the correct person using two identifiers.  Location: Patient: home Provider: office   I discussed the limitations, risks, security and privacy concerns of performing an evaluation and management service by telephone and the availability of in person appointments. I also discussed with the patient that there may be a patient responsible charge related to this service. The patient expressed understanding and agreed to proceed.  Type of Therapy: Individual Therapy   Treatment Goals addressed: "I'm really worried about my sleep issues and how they are effecting my memory and my mind, having more hallucinations, feeling out of it". [name] will improve psychological functioning as evidenced by reduced experience of psychoses, improvement in sleep, and more stabilized mood 3 out of 7 days.   Interventions: CBT, Motivational Interviewing, and psychoeducation   Summary: Darril Patriarca is a 74 y.o. male who presents with Bipolar 1 Disorder, Mixed, Moderate   Suicidal/Homicidal: No - without intent/plan   Therapist Response: Kaulder met with clinician for an individual session. Harshal discussed his psychiatric symptoms, his current life events and his homework. He reports there has not been much change in his relationship with son, which seems to continue to be up and down. However, [name] reports he talked with his sister, who actually told him what happened when he lost his memory. Clinician processed the event, noting that his fears had been correct: that he was confused, defiant, and aggressive, making threats toward son and daughter-in-law. [name] shared the sadness and disbelief that the police were called rather than [facility]. Clinician provided CBT reality testing about the level of his behaviors, as well as the lack of information about why he was acting the way he  was acting. Clinician validated that [name] was overcome with emotion and lost his memory due to this flooding. Clinician also noted the importance of taking this information and using it to help him find closure. Clinician explored what wife would say in this moment and encouraged [name] to take her advice.    Plan: Return again in 2-3 weeks.    Diagnosis: Axis I: Bipolar 1 Disorder, Mixed, Moderate      I discussed the assessment and treatment plan with the patient. The patient was provided an opportunity to ask questions and all were answered. The patient agreed with the plan and demonstrated an understanding of the instructions.   The patient was advised to call back or seek an in-person evaluation if the symptoms worsen or if the condition fails to improve as anticipated.  I provided 45 minutes of non-face-to-face time during this encounter.   [name], LCSW

## 2020-07-29 ENCOUNTER — Other Ambulatory Visit: Payer: Self-pay

## 2020-07-29 ENCOUNTER — Encounter (HOSPITAL_COMMUNITY): Payer: Self-pay | Admitting: Licensed Clinical Social Worker

## 2020-07-29 ENCOUNTER — Ambulatory Visit (INDEPENDENT_AMBULATORY_CARE_PROVIDER_SITE_OTHER): Payer: Medicare Other | Admitting: Licensed Clinical Social Worker

## 2020-07-29 DIAGNOSIS — F3162 Bipolar disorder, current episode mixed, moderate: Secondary | ICD-10-CM

## 2020-07-29 NOTE — Progress Notes (Signed)
Virtual Visit via Telephone Note  I connected with Justin [name] on 07/29/20 at  2:30 PM EST by telephone and verified that I am speaking with the correct person using two identifiers.  Location: Patient: home Provider: home office   I discussed the limitations, risks, security and privacy concerns of performing an evaluation and management service by telephone and the availability of in person appointments. I also discussed with the patient that there may be a patient responsible charge related to this service. The patient expressed understanding and agreed to proceed.  Type of Therapy: Individual Therapy   Treatment Goals addressed: "I'm really worried about my sleep issues and how they are effecting my memory and my mind, having more hallucinations, feeling out of it". Justin [name] will improve psychological functioning as evidenced by reduced experience of psychoses, improvement in sleep, and more stabilized mood 3 out of 7 days.   Interventions: CBT, Motivational Interviewing, and psychoeducation   Summary: Justin [name] is a 74 y.o. male who presents with Bipolar 1 Disorder, Mixed, Moderate   Suicidal/Homicidal: No - without intent/plan   Therapist Response: Justin [name] met with clinician for an individual session. Justin [name] discussed his psychiatric symptoms, his current life events and his homework. He reports that since he has found out more about what happened when he lost his memory, he reports he feels increasingly sad and stressed. Clinician explored the reasons for these feelings, noting that he thought he would feel better, but he feels a little worse. Clinician discussed the importance of being able to decide that he knows enough and that he is ready to move forward. Justin [name] processed relationships with sister, son and daughter in Sports coach. Clinician explored interactions at Thanksgiving and over the past couple of weeks. Justin [name] reports he has the opportunity to be set up with a woman who is the mother  of his son's friend. Clinician explored thoughts and feelings using  CBT. Clinician identified the possibility of friendship and companionship with this woman, whom he has met before. Clinician discussed feelings of nervousness with dating, noting he has not gone on a date since the 52s. Clinician identified that this was possibly true for this woman, who is also a widow. Clinician encouraged Justin [name] to think about it and make a decision based on his comfort level.    Plan: Return again in 2-3 weeks.    Diagnosis: Axis I: Bipolar 1 Disorder, Mixed, Moderate        I discussed the assessment and treatment plan with the patient. The patient was provided an opportunity to ask questions and all were answered. The patient agreed with the plan and demonstrated an understanding of the instructions.   The patient was advised to call back or seek an in-person evaluation if the symptoms worsen or if the condition fails to improve as anticipated.  I provided 45 minutes of non-face-to-face time during this encounter.   [name], LCSW

## 2020-08-12 ENCOUNTER — Ambulatory Visit (INDEPENDENT_AMBULATORY_CARE_PROVIDER_SITE_OTHER): Payer: Medicare Other | Admitting: Licensed Clinical Social Worker

## 2020-08-12 ENCOUNTER — Other Ambulatory Visit: Payer: Self-pay

## 2020-08-12 DIAGNOSIS — F4321 Adjustment disorder with depressed mood: Secondary | ICD-10-CM

## 2020-08-12 DIAGNOSIS — F3162 Bipolar disorder, current episode mixed, moderate: Secondary | ICD-10-CM | POA: Diagnosis not present

## 2020-08-14 ENCOUNTER — Encounter (HOSPITAL_COMMUNITY): Payer: Self-pay | Admitting: Licensed Clinical Social Worker

## 2020-08-14 NOTE — Progress Notes (Signed)
Virtual Visit via Telephone Note  I connected with [name] on 08/14/20 at  2:30 PM EST by telephone and verified that I am speaking with the correct person using two identifiers.  Location: Patient: home Provider: home office   I discussed the limitations, risks, security and privacy concerns of performing an evaluation and management service by telephone and the availability of in person appointments. I also discussed with the patient that there may be a patient responsible charge related to this service. The patient expressed understanding and agreed to proceed.  Type of Therapy: Individual Therapy   Treatment Goals addressed: "I'm really worried about my sleep issues and how they are effecting my memory and my mind, having more hallucinations, feeling out of it". [name] will improve psychological functioning as evidenced by reduced experience of psychoses, improvement in sleep, and more stabilized mood 3 out of 7 days.   Interventions: CBT, Motivational Interviewing, and psychoeducation   Summary: Daylyn Azbill is a 74 y.o. male who presents with Bipolar 1 Disorder, Mixed, Moderate   Suicidal/Homicidal: No - without intent/plan   Therapist Response: Dino met with clinician for an individual session. Goerge discussed his psychiatric symptoms, his current life events and his homework. He reports that he continues to struggle with his lost memories of when his wife died. Clinician reflected the frustration with knowing what happened, but not being able to really remember what happened for himself. Clinician validated the struggle and questioned why this was so important to him now. Clinician discussed the current status of his relationships with son and daughter in law, daughter, and sister. Clinician reflected that he plans to spend Christmas dinner with his sister, but he feels somewhat nervous to go without validation that the invitation has been really extended. [name] continues to  feel like he is walking on thin ice with his sister and his son. Clinician identified the importance of coming out and asking the questions so he will know where he stands.  [name] discussed dating and noted that he does not quite feel ready. Clinician normalized these feelings and reminded him to trust himself and his intuition.   Plan: Return again in 2-3 weeks.    Diagnosis: Axis I: Bipolar 1 Disorder, Mixed, Moderate           I discussed the assessment and treatment plan with the patient. The patient was provided an opportunity to ask questions and all were answered. The patient agreed with the plan and demonstrated an understanding of the instructions.   The patient was advised to call back or seek an in-person evaluation if the symptoms worsen or if the condition fails to improve as anticipated.  I provided 45 minutes of non-face-to-face time during this encounter.   [name], LCSW

## 2020-08-19 ENCOUNTER — Other Ambulatory Visit: Payer: Self-pay

## 2020-08-19 MED ORDER — ROSUVASTATIN CALCIUM 20 MG PO TABS
20.0000 mg | ORAL_TABLET | Freq: Every day | ORAL | 0 refills | Status: DC
Start: 2020-08-19 — End: 2020-08-20

## 2020-08-20 ENCOUNTER — Other Ambulatory Visit: Payer: Self-pay

## 2020-08-20 MED ORDER — ROSUVASTATIN CALCIUM 20 MG PO TABS
20.0000 mg | ORAL_TABLET | Freq: Every day | ORAL | 0 refills | Status: DC
Start: 2020-08-20 — End: 2021-02-23

## 2020-08-31 ENCOUNTER — Encounter (HOSPITAL_COMMUNITY): Payer: Self-pay | Admitting: Psychiatry

## 2020-08-31 ENCOUNTER — Other Ambulatory Visit: Payer: Self-pay

## 2020-08-31 ENCOUNTER — Telehealth (INDEPENDENT_AMBULATORY_CARE_PROVIDER_SITE_OTHER): Payer: Medicare Other | Admitting: Psychiatry

## 2020-08-31 DIAGNOSIS — F3162 Bipolar disorder, current episode mixed, moderate: Secondary | ICD-10-CM | POA: Diagnosis not present

## 2020-08-31 DIAGNOSIS — F411 Generalized anxiety disorder: Secondary | ICD-10-CM | POA: Diagnosis not present

## 2020-08-31 DIAGNOSIS — F4310 Post-traumatic stress disorder, unspecified: Secondary | ICD-10-CM | POA: Diagnosis not present

## 2020-08-31 MED ORDER — LAMOTRIGINE 200 MG PO TABS: 200.0000 mg | ORAL_TABLET | Freq: Every day | ORAL | 1 refills | Status: DC

## 2020-08-31 MED ORDER — DIAZEPAM 2 MG PO TABS: ORAL_TABLET | ORAL | 1 refills | Status: DC

## 2020-08-31 MED ORDER — ARIPIPRAZOLE 15 MG PO TABS: 15.0000 mg | ORAL_TABLET | Freq: Every day | ORAL | 1 refills | Status: DC

## 2020-08-31 NOTE — Progress Notes (Signed)
Virtual Visit via Telephone Note  I connected with Justin [name] on 08/31/20 at  9:40 AM EST by telephone and verified that I am speaking with the correct person using two identifiers.  Location: Patient: home Provider: home office   I discussed the limitations, risks, security and privacy concerns of performing an evaluation and management service by telephone and the availability of in person appointments. I also discussed with the patient that there may be a patient responsible charge related to this service. The patient expressed understanding and agreed to proceed.   History of Present Illness: Patient is evaluated by phone session.  He is on the phone by himself.  Patient told Christmas was difficult because he saw the grandkids only short while.  However he was pleased that his sister invited him for Christmas.  He is in therapy with Janett Billow.  He reported holidays were difficult because of missing his wife who died 5 years ago and having financial issues as things are very expensive.  He also have chronic family issues as does not see grandkids frequently.  Last week he has to take extra Valium because of severe weather [place] with heavy wind and loud.  He reported his nightmares also came back but now he is feeling better.  He denies any anger, agitation, mood swings or any suicidal thoughts.  However he does get frustrated when he think about finances and things are getting expensive.  He is taking Valium 2 mg at bedtime and occasionally take 1 tablet during the day when he feels very nervous.  So far he feels the current medicine is working and he denies any paranoia, hallucination.  He has visible anxiety but it is under control.  He is not taking Cogentin because he denies any tremors.  He is taking Abilify, Lamictal and Valium.  He has no rash, itching, tremors or shakes.  He has a upcoming appointment with his cardiologist in March.  He denies drinking or using any illegal substances.  His  appetite is okay.  His weight is stable.  He tried to go to walk every day with the dog.   Past Psychiatric History:Reviewed. H/O suicidal attemptby overdose on Ambientwice, hydroxyzine and alcohol. H/Obipolar disorder with multipleinpatient. DidIOP. LastER visit Sept 2020 and[facility]patientOctober [facility].Tried Zyprexa, Cymbalta, Seroquel, Vistaril, Neurontin,Wellbutrin and triptal.Tried low-dose doxepinandtrazodone.Had a reaction with Ativan and pain medication. SawDr. Letta Moynahan inpast.   Psychiatric Specialty Exam: Physical Exam  Review of Systems  Weight 167 lb (75.8 kg).There is no height or weight on file to calculate BMI.  General Appearance: NA  Eye Contact:  NA  Speech:  Slow  Volume:  Normal  Mood:  Dysphoric  Affect:  NA  Thought Process:  Goal Directed  Orientation:  Full (Time, Place, and Person)  Thought Content:  Rumination  Suicidal Thoughts:  No  Homicidal Thoughts:  No  Memory:  Immediate;   Good Recent;   Good Remote;   Good  Judgement:  Intact  Insight:  Present  Psychomotor Activity:  NA  Concentration:  Concentration: Fair and Attention Span: Fair  Recall:  Good  Fund of Knowledge:  Good  Language:  Good  Akathisia:  No  Handed:  Right  AIMS (if indicated):     Assets:  Communication Skills Desire for Improvement Housing Resilience  ADL's:  Intact  Cognition:  WNL  Sleep:   ok      Assessment and Plan: Bipolar disorder type I.  PTSD.  Generalized anxiety disorder.  Patient is still have episodes of frustration but overall he feels the current medicine is working.  Encourage therapy with Janett Billow.  He is not taking Cogentin since he does not have tremors.  Recommend to continue Valium 2 mg at bedtime and take half tablet during the day if he is very nervous and anxious.  Continue lamotrigine 200 mg daily and Abilify 15 mg at bedtime.  Recommended to call us back if is any question or any concern.  We have cut down Abilify  dose from 20 mg to now 15 mg as he was complaining of attention and concentration.  His attention concentration is improved.  Recommended to call us back if is any question or any concern.  Follow-up in 2 months.  Follow Up Instructions:    I discussed the assessment and treatment plan with the patient. The patient was provided an opportunity to ask questions and all were answered. The patient agreed with the plan and demonstrated an understanding of the instructions.   The patient was advised to call back or seek an in-person evaluation if the symptoms worsen or if the condition fails to improve as anticipated.  I provided 18 minutes of non-face-to-face time during this encounter.   Kathlee Nations, MD

## 2020-09-02 ENCOUNTER — Encounter (HOSPITAL_COMMUNITY): Payer: Self-pay

## 2020-09-02 ENCOUNTER — Other Ambulatory Visit: Payer: Self-pay

## 2020-09-02 ENCOUNTER — Ambulatory Visit (INDEPENDENT_AMBULATORY_CARE_PROVIDER_SITE_OTHER): Payer: Medicare Other | Admitting: Licensed Clinical Social Worker

## 2020-09-02 ENCOUNTER — Emergency Department (HOSPITAL_COMMUNITY)
Admission: EM | Admit: 2020-09-02 | Discharge: 2020-09-03 | Disposition: A | Discharge status: Home / Self Care | Payer: Medicare Other | Attending: Emergency Medicine | Admitting: Emergency Medicine

## 2020-09-02 DIAGNOSIS — F3162 Bipolar disorder, current episode mixed, moderate: Secondary | ICD-10-CM

## 2020-09-02 DIAGNOSIS — Z96652 Presence of left artificial knee joint: Secondary | ICD-10-CM | POA: Diagnosis not present

## 2020-09-02 DIAGNOSIS — Z79899 Other long term (current) drug therapy: Secondary | ICD-10-CM | POA: Insufficient documentation

## 2020-09-02 DIAGNOSIS — S0501XA Injury of conjunctiva and corneal abrasion without foreign body, right eye, initial encounter: Secondary | ICD-10-CM | POA: Diagnosis not present

## 2020-09-02 DIAGNOSIS — F0390 Unspecified dementia without behavioral disturbance: Secondary | ICD-10-CM | POA: Diagnosis not present

## 2020-09-02 DIAGNOSIS — W228XXA Striking against or struck by other objects, initial encounter: Secondary | ICD-10-CM | POA: Insufficient documentation

## 2020-09-02 DIAGNOSIS — I2510 Atherosclerotic heart disease of native coronary artery without angina pectoris: Secondary | ICD-10-CM | POA: Insufficient documentation

## 2020-09-02 DIAGNOSIS — I10 Essential (primary) hypertension: Secondary | ICD-10-CM | POA: Insufficient documentation

## 2020-09-02 DIAGNOSIS — S0591XA Unspecified injury of right eye and orbit, initial encounter: Secondary | ICD-10-CM | POA: Diagnosis present

## 2020-09-02 MED ORDER — TETRACAINE HCL 0.5 % OP SOLN
2.0000 [drp] | Freq: Once | OPHTHALMIC | Status: AC
  Administered 2020-09-03: 2 [drp] via OPHTHALMIC
  Filled 2020-09-02: qty 4

## 2020-09-02 MED ORDER — FLUORESCEIN SODIUM 1 MG OP STRP
1.0000 | ORAL_STRIP | Freq: Once | OPHTHALMIC | Status: AC
  Administered 2020-09-03: 1 via OPHTHALMIC
  Filled 2020-09-02: qty 1

## 2020-09-02 NOTE — ED Notes (Signed)
Visual acuity completed Right eye: 20/200 Left eye: 20/40 Both eyes: 20/50

## 2020-09-02 NOTE — ED Triage Notes (Signed)
Patient arrived stating he had a stick in the yard scratch his eye today at noon and has had increased pain since. No changes in vision.

## 2020-09-03 ENCOUNTER — Encounter (HOSPITAL_COMMUNITY): Payer: Self-pay | Admitting: Licensed Clinical Social Worker

## 2020-09-03 MED ORDER — KETOROLAC TROMETHAMINE 0.5 % OP SOLN
1.0000 [drp] | Freq: Four times a day (QID) | OPHTHALMIC | Status: DC
  Administered 2020-09-03: 1 [drp] via OPHTHALMIC
  Filled 2020-09-03: qty 5

## 2020-09-03 MED ORDER — POLYMYXIN B-TRIMETHOPRIM 10000-0.1 UNIT/ML-% OP SOLN
1.0000 [drp] | OPHTHALMIC | Status: DC
  Administered 2020-09-03: 1 [drp] via OPHTHALMIC
  Filled 2020-09-03: qty 10

## 2020-09-03 NOTE — Discharge Instructions (Signed)
Continue using eye drops Acular 4 times daily, polytrim every 4 hours. Follow-up with eye doctor.  If you do not have one, Dr. Nancy Fetter is on call for the hospital and can see you in clinic.  Call in the morning. Return here for new concerns.

## 2020-09-03 NOTE — ED Provider Notes (Signed)
[facility] DEPT Provider Note   CSN: [id number] Arrival date & time: 09/02/20  1951     History Chief Complaint  Patient presents with  . Eye Injury    Justin [name] is a 75 y.o. male.  The history is provided by the patient and medical records.    75 year old male with history of anxiety, arthritis, history of MI, coronary artery disease, presenting to the ED for right eye pain.  States he was helping his son pick up limbs in the yard and was carrying a pile of sticks when one poked him in his right eye.  Has had worsening eye discomfort and tearing of the eye since that time.  He does wear reading glasses but no corrective lenses.  States vision in right eye seems blurred, not sure if it was from his vision or because his eye is watering.  Past Medical History:  Diagnosis Date  . Anxiety and depression   . Arthritis    knees, c-spine // gets lumbar ESI q 6 mos  . Bipolar disorder ([place])   . CAD (coronary artery disease)    [place] 08/2009 >> LHC ([facility]) - mLAD 70, Dx 65 >> PCI:  3x15 mm Endeavor DES to mLAD and 2.5x12 mm Endeavor DES to Dx // S/p NSTEMI >> LHC 8/12 ([facility]) - LM ok, LAD prox and mid 40; LAD stent ok, Dx stent ok, mRCA 30, mLCx 50 >> med Rx // ETT-Echo 2/17 ([facility]): Normal, EF 55-60 at rest  . Chicken pox   . 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 Grief   . History of echocardiogram    Echo 3/19: Mild concentric LVH, EF 60-65, normal wall motion, grade 1 diastolic dysfunction, mild AI, mildly dilated aortic root (40 mm), MAC, mild LAE, atrial septal lipomatous hypertrophy  . History of non-ST elevation myocardial infarction (NSTEMI)   . History of nuclear stress test    Nuclear stress test 3/19: EF 57, inferior/inferoseptal/inferolateral defect consistent with probable soft tissue attenuation (cannot exclude subendocardial scar), no ischemia, intermediate risk >> Echo 3/19 normal EF, normal wall motion  . History of pneumonia  2011  . History of prostatitis 1990s  . Hx of adenomatous colonic polyps 06/22/2016  . Hyperlipidemia   . Hypertension   . Migraines    prior history  . PTSD (post-traumatic stress disorder)     Patient Active Problem List   Diagnosis Date Noted  . Insomnia 11/22/2019  . Moderate protein-calorie malnutrition ([place]) 10/15/2019  . Nystagmus, end-position 10/15/2019  . Tremor observed on examination 10/15/2019  . Amnesia memory loss 10/15/2019  . Cognitive decline 10/15/2019  . 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 Hyperfunction of pituitary gland, unspecified ([place]) 10/15/2019  . Coronary artery disease involving native coronary artery of native heart with angina pectoris ([place]) 10/15/2019  . Sedative, hypnotic or anxiolytic dependence with withdrawal, unspecified ([place]) 10/15/2019  . Memory loss or impairment 08/08/2019  . Postoperative anemia due to acute blood loss 09/03/2018  . Primary osteoarthritis of left knee 08/31/2018  . Neck pain 12/08/2017  . Hyperlipidemia 10/30/2017  . Anemia 06/19/2017  . Hyperglycemia 06/19/2017  . Elevated prolactin level 06/19/2017  . Closed fracture of right hand 06/19/2017  . Bipolar affective disorder, depressed, severe ([place]) 06/12/2017  . Benzodiazepine withdrawal with complication ([place]) 09/32/3557  . Nail lesion 02/25/2017  . Hx of adenomatous colonic polyps 06/22/2016  . Memory changes 06/09/2016  . CAD (coronary artery disease)   .  Right knee pain 01/01/2016  . Olecranon bursitis of right elbow 12/10/2015  . Bipolar I disorder ([place]) 06/26/2015  . Routine general medical examination at a health care facility 03/26/2015  . Medicare annual wellness visit, subsequent 03/26/2015  . Rash and nonspecific skin eruption 03/13/2015  . Generalized anxiety disorder 12/23/2014  . Low back pain 12/23/2014  . Essential hypertension 12/23/2014    Past Surgical History:  Procedure Laterality Date  . CERVICAL DISCECTOMY     . COLONOSCOPY  03/2017  . ELBOW SURGERY Left   . ELBOW SURGERY Right   . INGUINAL HERNIA REPAIR Bilateral    1996, 1997  . KNEE ARTHROSCOPY Right   . KNEE CARTILAGE SURGERY Left   . NASAL SEPTUM SURGERY    . STENT PLACEMENT VASCULAR ([facility])  09/02/2010  . TONSILLECTOMY    . TOTAL KNEE ARTHROPLASTY Left 08/31/2018   Procedure: TOTAL KNEE ARTHROPLASTY;  Surgeon: Dorna Leitz, MD;  Location: [facility];  Service: Orthopedics;  Laterality: Left;       Family History  Problem Relation Age of Onset  . Arthritis Mother   . Heart disease Mother        s/p CABG  . Hypertension Mother   . Alzheimer's disease Mother   . Heart attack Mother   . Arthritis Father   . Hyperlipidemia Father   . Heart disease Father        s/p CABG  . Stroke Father   . Hypertension Father   . Heart attack Father   . Multiple sclerosis Sister   . Diabetes Sister   . Lung cancer Paternal Grandmother   . Diabetes Sister   . Heart attack Sister   . Pulmonary embolism Sister        died  . Lung cancer Maternal Aunt   . Lung cancer Paternal Aunt   . Depression Brother        suicide    Social History   Tobacco Use  . Smoking status: Never Smoker  . Smokeless tobacco: Never Used  Vaping Use  . Vaping Use: Never used  Substance Use Topics  . Alcohol use: No    Alcohol/week: 0.0 standard drinks  . Drug use: No    Home Medications Prior to Admission medications   Medication Sig Start Date End Date Taking? Authorizing Provider  ARIPiprazole (ABILIFY) 15 MG tablet Take 1 tablet (15 mg total) by mouth daily. 08/31/20   Arfeen, Arlyce Harman, MD  benztropine (COGENTIN) 0.5 MG tablet Take 1 tablet (0.5 mg total) by mouth daily. Patient not taking: Reported on 08/31/2020 07/08/20 07/08/21  Kathlee Nations, MD  clopidogrel (PLAVIX) 75 MG tablet TAKE 1 TABLET BY MOUTH DAILY 06/02/20   Richardson Dopp T, PA-C  diazepam (VALIUM) 2 MG tablet Take one tab at bed time and one tab in day as needed for anxiety 08/31/20    Arfeen, Arlyce Harman, MD  diltiazem (CARDIZEM CD) 180 MG 24 hr capsule TAKE 1 CAPSULE BY MOUTH DAILY 06/02/20   Richardson Dopp T, PA-C  lamoTRIgine (LAMICTAL) 200 MG tablet Take 1 tablet (200 mg total) by mouth daily. For mood control 08/31/20   Arfeen, Arlyce Harman, MD  nitroGLYCERIN (NITROSTAT) 0.4 MG SL tablet Place 1 tablet (0.4 mg total) under the tongue every 5 (five) minutes as needed for chest pain. 06/04/19   Richardson Dopp T, PA-C  rosuvastatin (CRESTOR) 20 MG tablet Take 1 tablet (20 mg total) by mouth daily. Please schedule for future refills. Thank you! 08/20/20  Richardson Dopp T, PA-C    Allergies    Ambien [zolpidem tartrate] and Seroquel [quetiapine]  Review of Systems   Review of Systems  Eyes: Positive for pain.  All other systems reviewed and are negative.   Physical Exam Updated Vital Signs BP (!) 179/97 (BP Location: Left Arm)   Pulse 73   Temp 97.8 F (36.6 C) (Oral)   Resp 20   SpO2 100%   Physical Exam Vitals and nursing note reviewed.  Constitutional:      Appearance: He is well-developed and well-nourished.  HENT:     Head: Normocephalic and atraumatic.     Mouth/Throat:     Mouth: Oropharynx is clear and moist.  Eyes:     Extraocular Movements: EOM normal.     Conjunctiva/sclera: Conjunctivae normal.     Pupils: Pupils are equal, round, and reactive to light.     Comments: Keeping right eye closed, no lid edema or erythema Right conjunctiva injected, tearing, obvious discomfort with photophobia, EOMs intact, fluorescein stain performed, large right corneal abrasion without uptake, no ulcer noted  Cardiovascular:     Rate and Rhythm: Normal rate and regular rhythm.     Heart sounds: Normal heart sounds.  Pulmonary:     Effort: Pulmonary effort is normal.     Breath sounds: Normal breath sounds.  Abdominal:     General: Bowel sounds are normal.     Palpations: Abdomen is soft.  Musculoskeletal:        General: Normal range of motion.     Cervical back:  Normal range of motion.  Skin:    General: Skin is warm and dry.  Neurological:     Mental Status: He is alert and oriented to person, place, and time.  Psychiatric:        Mood and Affect: Mood and affect normal.     ED Results / Procedures / Treatments   Labs (all labs ordered are listed, but only abnormal results are displayed) Labs Reviewed - No data to display  EKG None  Radiology No results found.  Procedures Procedures (including critical care time)  Medications Ordered in ED Medications  ketorolac (ACULAR) 0.5 % ophthalmic solution 1 drop (1 drop Right Eye Given 09/03/20 0039)  trimethoprim-polymyxin b (POLYTRIM) ophthalmic solution 1 drop (1 drop Right Eye Given 09/03/20 0039)  tetracaine (PONTOCAINE) 0.5 % ophthalmic solution 2 drop (2 drops Right Eye Given by Other 09/03/20 0015)  fluorescein ophthalmic strip 1 strip (1 strip Right Eye Given by Other 09/03/20 0015)    ED Course  I have reviewed the triage vital signs and the nursing notes.  Pertinent labs & imaging results that were available during my care of the patient were reviewed by me and considered in my medical decision making (see chart for details).    MDM Rules/Calculators/A&P  75 year old male presenting to the ED with right eye pain after being hit in the eye with a stick earlier today while picking up limbs.  No lid edema or erythema.  Conjunctive is injected and eye is continuously tearing.  There is obvious photophobia.  Tetracaine was applied with immediate relief of symptoms, able to hold eyes open for exam.  Fluorescein stain performed, does have large corneal abrasion without uptake.  No corneal ulcer noted.  He wears reading glasses but no corrective lenses.  Will start on Polytrim and Acular drops and have him follow-up closely with ophthalmology, given information for physician on-call.  He may return here for any  new or acute changes.  Final Clinical Impression(s) / ED Diagnoses Final  diagnoses:  Abrasion of right cornea, initial encounter    Rx / DC Orders ED Discharge Orders    None       Larene Pickett, PA-C 09/03/20 [place], Merrill, DO 09/03/20 [phone]

## 2020-09-03 NOTE — Progress Notes (Signed)
Virtual Visit via Telephone Note  I connected with [name] on 09/03/20 at  2:30 PM EST by telephone and verified that I am speaking with the correct person using two identifiers.  Location: Patient: home Provider: home office   I discussed the limitations, risks, security and privacy concerns of performing an evaluation and management service by telephone and the availability of in person appointments. I also discussed with the patient that there may be a patient responsible charge related to this service. The patient expressed understanding and agreed to proceed.  Type of Therapy: Individual Therapy   Treatment Goals addressed: "I'm really worried about my sleep issues and how they are effecting my memory and my mind, having more hallucinations, feeling out of it". [name] will improve psychological functioning as evidenced by reduced experience of psychoses, improvement in sleep, and more stabilized mood 3 out of 7 days.   Interventions: CBT   Summary: Damarius Karnes is a 75 y.o. male who presents with Bipolar 1 Disorder, Mixed, Moderate   Suicidal/Homicidal: No - without intent/plan   Therapist Response: Bensen met with clinician for an individual session. Royale discussed his psychiatric symptoms, his current life events and his homework. He shared that he continues to struggle with his relationship with his son. Clinician processed thoughts, feelings and interactions with son over the past week. Clinician identified the possibility that son has Bipolar Disorder, as [name] has it and shared a lot of similar behaviors. Clinician also noted that due to [name]'s depressed mood, he tends to be more sensitive toward son's bad attitude or argumentative side. Clinician explored coping skills and encouraged [name] to set more effective boundaries with son, especially when it comes to how son treats him. Clinician reflected concerns that son tested positive for COVID. Clinician encouraged [name] to get  tested as well, as they had been together a lot, and [name] reported having a sore throat and runny nose.    Plan: Return again in 2-3 weeks.    Diagnosis: Axis I: Bipolar 1 Disorder, Mixed, Moderate       I discussed the assessment and treatment plan with the patient. The patient was provided an opportunity to ask questions and all were answered. The patient agreed with the plan and demonstrated an understanding of the instructions.   The patient was advised to call back or seek an in-person evaluation if the symptoms worsen or if the condition fails to improve as anticipated.  I provided 45 minutes of non-face-to-face time during this encounter.   [name], LCSW

## 2020-09-16 ENCOUNTER — Other Ambulatory Visit: Payer: Self-pay

## 2020-09-16 ENCOUNTER — Ambulatory Visit (INDEPENDENT_AMBULATORY_CARE_PROVIDER_SITE_OTHER): Payer: Medicare Other | Admitting: Licensed Clinical Social Worker

## 2020-09-16 DIAGNOSIS — F411 Generalized anxiety disorder: Secondary | ICD-10-CM | POA: Diagnosis not present

## 2020-09-16 DIAGNOSIS — F3162 Bipolar disorder, current episode mixed, moderate: Secondary | ICD-10-CM | POA: Diagnosis not present

## 2020-09-17 ENCOUNTER — Encounter (HOSPITAL_COMMUNITY): Payer: Self-pay | Admitting: Licensed Clinical Social Worker
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
## 2020-09-30 ENCOUNTER — Other Ambulatory Visit: Payer: Self-pay

## 2020-09-30 ENCOUNTER — Ambulatory Visit (INDEPENDENT_AMBULATORY_CARE_PROVIDER_SITE_OTHER): Payer: Medicare Other | Admitting: Licensed Clinical Social Worker

## 2020-09-30 DIAGNOSIS — F3162 Bipolar disorder, current episode mixed, moderate: Secondary | ICD-10-CM

## 2020-10-01 ENCOUNTER — Encounter (HOSPITAL_COMMUNITY): Payer: Self-pay | Admitting: Licensed Clinical Social Worker

## 2020-10-01 NOTE — Progress Notes (Signed)
Virtual Visit via Telephone Note  I connected with [name] on 10/01/20 at  3:30 PM EST by telephone and verified that I am speaking with the correct person using two identifiers.  Location: Patient: home Provider: home office   I discussed the limitations, risks, security and privacy concerns of performing an evaluation and management service by telephone and the availability of in person appointments. I also discussed with the patient that there may be a patient responsible charge related to this service. The patient expressed understanding and agreed to proceed.  Type of Therapy: Individual Therapy   Treatment Goals addressed: "I'm really worried about my sleep issues and how they are effecting my memory and my mind, having more hallucinations, feeling out of it". [name] will improve psychological functioning as evidenced by reduced experience of psychoses, improvement in sleep, and more stabilized mood 3 out of 7 days.   Interventions: CBT   Summary: Damond Borchers is a 75 y.o. male who presents with Bipolar 1 Disorder, Mixed, Moderate   Suicidal/Homicidal: No - without intent/plan   Therapist Response: Derrik met with clinician for an individual session. Sutter discussed his psychiatric symptoms, his current life events and his homework. He shared that he seems to have patched up things with son for now. Clinician utilized CBT to process thoughts, feelings, and behaviors with family members. Clinician noted that [name] is quick to make assumptions that his son or his sister are upset with him if they do not reach out. Clinician provided reality testing about their lives and explored options for [name] to reach out to them. Clinician explored the possibility that they may think the same thing if he does not respond to their messages. Clinician discussed ways to incorporate more self-compassion in his daily life. Clinician shared coping skills that include positive self talk, deep breathing,  and relaxation.   Plan: Return again in 2-3 weeks.    Diagnosis: Axis I: Bipolar 1 Disorder, Mixed, Moderate     I discussed the assessment and treatment plan with the patient. The patient was provided an opportunity to ask questions and all were answered. The patient agreed with the plan and demonstrated an understanding of the instructions.   The patient was advised to call back or seek an in-person evaluation if the symptoms worsen or if the condition fails to improve as anticipated.  I provided 45 minutes of non-face-to-face time during this encounter.   [name], LCSW

## 2020-10-15 ENCOUNTER — Other Ambulatory Visit: Payer: Self-pay

## 2020-10-15 ENCOUNTER — Ambulatory Visit (INDEPENDENT_AMBULATORY_CARE_PROVIDER_SITE_OTHER): Payer: Medicare Other | Admitting: Licensed Clinical Social Worker

## 2020-10-15 DIAGNOSIS — F4321 Adjustment disorder with depressed mood: Secondary | ICD-10-CM

## 2020-10-15 DIAGNOSIS — F3162 Bipolar disorder, current episode mixed, moderate: Secondary | ICD-10-CM

## 2020-10-19 ENCOUNTER — Encounter (HOSPITAL_COMMUNITY): Payer: Self-pay | Admitting: Licensed Clinical Social Worker

## 2020-10-19 NOTE — Progress Notes (Signed)
Virtual Visit via Telephone Note  I connected with [name] on 10/19/20 at  3:30 PM EST by telephone and verified that I am speaking with the correct person using two identifiers.  Location: Patient: home Provider: home office   I discussed the limitations, risks, security and privacy concerns of performing an evaluation and management service by telephone and the availability of in person appointments. I also discussed with the patient that there may be a patient responsible charge related to this service. The patient expressed understanding and agreed to proceed.    Type of Therapy: Individual Therapy   Treatment Goals addressed: "I'm really worried about my sleep issues and how they are effecting my memory and my mind, having more hallucinations, feeling out of it". [name] will improve psychological functioning as evidenced by reduced experience of psychoses, improvement in sleep, and more stabilized mood 3 out of 7 days.   Interventions: CBT   Summary: Gabor Lusk is a 75 y.o. male who presents with Bipolar 1 Disorder, Mixed, Moderate   Suicidal/Homicidal: No - without intent/plan   Therapist Response: Mitul met with clinician for an individual session. Gilmore discussed his psychiatric symptoms, his current life events and his homework. He shared that he continues to have a "back and forth" relationship with his son and daughter. Clinician explored recent contact with daughter using CBT. Clinician challenged [name]'s concerns that he would lose those relationships if he didn't go out of his way for his children. Clinician reflected experiences with children and processed ways to set and maintain appropriate boundaries. Clinician also encouraged [name] to work on increasing his confidence about his place in the family structure.  Clinician explored sleep and memory. [name] shared that he has been doing a little better, but still has some trouble sleeping about 2-3 nights per week.     Plan: Return again in 2-3 weeks.    Diagnosis: Axis I: Bipolar 1 Disorder, Mixed, Moderate     I discussed the assessment and treatment plan with the patient. The patient was provided an opportunity to ask questions and all were answered. The patient agreed with the plan and demonstrated an understanding of the instructions.   The patient was advised to call back or seek an in-person evaluation if the symptoms worsen or if the condition fails to improve as anticipated.  I provided 45 minutes of non-face-to-face time during this encounter.   [name], LCSW

## 2020-10-27 ENCOUNTER — Ambulatory Visit: Payer: Medicare Other | Admitting: Physician Assistant

## 2020-10-28 ENCOUNTER — Other Ambulatory Visit: Payer: Self-pay

## 2020-10-28 ENCOUNTER — Ambulatory Visit (INDEPENDENT_AMBULATORY_CARE_PROVIDER_SITE_OTHER): Payer: Medicare Other | Admitting: Licensed Clinical Social Worker

## 2020-10-28 ENCOUNTER — Encounter (HOSPITAL_COMMUNITY): Payer: Self-pay | Admitting: Licensed Clinical Social Worker

## 2020-10-28 DIAGNOSIS — F3162 Bipolar disorder, current episode mixed, moderate: Secondary | ICD-10-CM | POA: Diagnosis not present

## 2020-10-28 NOTE — Progress Notes (Signed)
Virtual Visit via Telephone Note  I connected with Justin [name] on 10/28/20 at 12:30 PM EST by telephone and verified that I am speaking with the correct person using two identifiers.  Location: Patient: home Provider: home office   I discussed the limitations, risks, security and privacy concerns of performing an evaluation and management service by telephone and the availability of in person appointments. I also discussed with the patient that there may be a patient responsible charge related to this service. The patient expressed understanding and agreed to proceed.  Type of Therapy: Individual Therapy   Treatment Goals addressed: "I'm really worried about my sleep issues and how they are effecting my memory and my mind, having more hallucinations, feeling out of it". Justin [name] will improve psychological functioning as evidenced by reduced experience of psychoses, improvement in sleep, and more stabilized mood 3 out of 7 days.   Interventions: Motivational Interviewing, CBT   Summary: Justin "Justin [name]" [name] is a 75 y.o. male who presents with Bipolar 1 Disorder, Mixed, Moderate   Suicidal/Homicidal: No - without intent/plan   Therapist Response: Justin [name] met with clinician for an individual session. Justin [name] discussed his psychiatric symptoms, his current life events and his homework. He shared that he feels "disgusted" by what he continues to learn about his experience when he lost his memory when his wife died. Clinician utilized [place] OARS to reflect and summarize thoughts and feelings. Clinician processed the impact of his current level of frustration and anger on the past, his present state, and his future relationships with son and daughter in law. Clinician reflected the sadness and ongoing difficulty coping with the loss of his wife. Clinician noted that the 5 year anniversary of her death is coming in about 10 days. Clinician encouraged Justin [name] to use CBT coping skills, taking deep breaths,  relaxation techniques, and reality testing. Clinician also reflected the importance of letting the past remain in the past and to consider the context of how son and daughter in law were feeling at that time.    Plan: Return again in 2-3 weeks. Clinician explored possibility of returning to [facility]. Justin [name] agreed to consider this, but will look outside of [facility] for an in person group.    Diagnosis: Axis I: Bipolar 1 Disorder, Mixed, Moderate      I discussed the assessment and treatment plan with the patient. The patient was provided an opportunity to ask questions and all were answered. The patient agreed with the plan and demonstrated an understanding of the instructions.   The patient was advised to call back or seek an in-person evaluation if the symptoms worsen or if the condition fails to improve as anticipated.  I provided 45 minutes of non-face-to-face time during this encounter.   [name], LCSW

## 2020-11-09 ENCOUNTER — Other Ambulatory Visit (HOSPITAL_COMMUNITY): Payer: Self-pay | Admitting: *Deleted

## 2020-11-09 DIAGNOSIS — F411 Generalized anxiety disorder: Secondary | ICD-10-CM

## 2020-11-09 MED ORDER — DIAZEPAM 2 MG PO TABS: ORAL_TABLET | ORAL | 0 refills | Status: DC

## 2020-11-11 ENCOUNTER — Ambulatory Visit (INDEPENDENT_AMBULATORY_CARE_PROVIDER_SITE_OTHER): Payer: Medicare Other | Admitting: Licensed Clinical Social Worker

## 2020-11-11 ENCOUNTER — Other Ambulatory Visit: Payer: Self-pay

## 2020-11-11 ENCOUNTER — Encounter (HOSPITAL_COMMUNITY): Payer: Self-pay | Admitting: Licensed Clinical Social Worker

## 2020-11-11 DIAGNOSIS — F4321 Adjustment disorder with depressed mood: Secondary | ICD-10-CM | POA: Diagnosis not present

## 2020-11-11 DIAGNOSIS — F3162 Bipolar disorder, current episode mixed, moderate: Secondary | ICD-10-CM

## 2020-11-11 NOTE — Progress Notes (Signed)
Virtual Visit via Telephone Note  I connected with Justin [name] on 11/11/20 at 12:30 PM EDT by telephone and verified that I am speaking with the correct person using two identifiers.  Location: Patient: home Provider: home office   I discussed the limitations, risks, security and privacy concerns of performing an evaluation and management service by telephone and the availability of in person appointments. I also discussed with the patient that there may be a patient responsible charge related to this service. The patient expressed understanding and agreed to proceed.  Type of Therapy: Individual Therapy   Treatment Goals addressed: "I'm really worried about my sleep issues and how they are effecting my memory and my mind, having more hallucinations, feeling out of it". Justin [name] will improve psychological functioning as evidenced by reduced experience of psychoses, improvement in sleep, and more stabilized mood 3 out of 7 days.   Interventions: Motivational Interviewing   Summary: Justin [name] is a 75 y.o. male who presents with Bipolar 1 Disorder, Mixed, Moderate   Suicidal/Homicidal: No - without intent/plan   Therapist Response: Justin [name] met with clinician for an individual session. Justin [name] discussed his psychiatric symptoms, his current life events and his homework. He shared that he feels he turned the corner on his grief and depression this week. Justin [name] reports he has decided to keep living his life and to allow the past to stay behind him. Clinician utilized MI OARS to reflect and summarize thoughts and feelings. Clinician explored the change from past sessions and noted that he has realized that he cannot change the past or his behaviors back then, but he wants to be happier in his present. Clinician discussed ways to maintain this positive outlook and explored options for Justin [name] to do some self-care. Justin [name] agreed that he plans to get a hair cut this week and to start thinking about a  vacation. Clinician provided feedback about taking a vacation and getting away for a little while to relax and see new surroundings. Justin [name] shared that he has not gone on a vacation since before his wife died.      Plan: Return again in 2-3 weeks. No MHIOP at this time, will keep this in mind if things change.    Diagnosis: Axis I: Bipolar 1 Disorder, Mixed, Moderate      I discussed the assessment and treatment plan with the patient. The patient was provided an opportunity to ask questions and all were answered. The patient agreed with the plan and demonstrated an understanding of the instructions.   The patient was advised to call back or seek an in-person evaluation if the symptoms worsen or if the condition fails to improve as anticipated.  I provided 45 minutes of non-face-to-face time during this encounter.   [name], LCSW

## 2020-11-24 ENCOUNTER — Ambulatory Visit (INDEPENDENT_AMBULATORY_CARE_PROVIDER_SITE_OTHER): Payer: Medicare Other | Admitting: Licensed Clinical Social Worker

## 2020-11-24 ENCOUNTER — Other Ambulatory Visit: Payer: Self-pay

## 2020-11-24 DIAGNOSIS — F3162 Bipolar disorder, current episode mixed, moderate: Secondary | ICD-10-CM

## 2020-11-25 ENCOUNTER — Encounter (HOSPITAL_COMMUNITY): Payer: Self-pay | Admitting: Licensed Clinical Social Worker

## 2020-11-25 NOTE — Progress Notes (Signed)
Virtual Visit via Telephone Note  I connected with Justin [name] on 11/25/20 at  1:30 PM EDT by telephone and verified that I am speaking with the correct person using two identifiers.  Location: Patient: home Provider: office   I discussed the limitations, risks, security and privacy concerns of performing an evaluation and management service by telephone and the availability of in person appointments. I also discussed with the patient that there may be a patient responsible charge related to this service. The patient expressed understanding and agreed to proceed.  Type of Therapy: Individual Therapy   Treatment Goals addressed: "I'm really worried about my sleep issues and how they are effecting my memory and my mind, having more hallucinations, feeling out of it". Justin [name] will improve psychological functioning as evidenced by reduced experience of psychoses, improvement in sleep, and more stabilized mood 3 out of 7 days.   Interventions: Motivational Interviewing   Summary: Justin [name] is a 75 y.o. male who presents with Bipolar 1 Disorder, Mixed, Moderate   Suicidal/Homicidal: No - without intent/plan   Therapist Response: Braelynn met with clinician for an individual session. Justin [name] discussed his psychiatric symptoms, his current life events and his homework. He shared frustration and confusion about his son's back and forth communication with him. Clinician explored updates over the past week using MI OARS. Clinician reflected the mixed messages given when he is asked to babysit, but then his son comes home within 5 minutes of the kid getting off the bus. Clinician reflected Justin [name]'s confusion and encouraged Justin [name] to speak up about how this makes him feel. Justin [name] shared ongoing hesitation about opening up to his son about this, as son may become belligerent and may take his grandchildren away from [place]. Justin [name] shared that he is not ready to risk those relationships, as they are really the only  current relationships he has. Clinician explored thoughts and plans to get involved in the community. Justin [name] shared no updates at this time.      Plan: Return again in 2-3 weeks.  Diagnosis: Axis I: Bipolar 1 Disorder, Mixed, Moderate      I discussed the assessment and treatment plan with the patient. The patient was provided an opportunity to ask questions and all were answered. The patient agreed with the plan and demonstrated an understanding of the instructions.   The patient was advised to call back or seek an in-person evaluation if the symptoms worsen or if the condition fails to improve as anticipated.  I provided 45 minutes of non-face-to-face time during this encounter.   [name], LCSW

## 2020-11-30 ENCOUNTER — Telehealth (INDEPENDENT_AMBULATORY_CARE_PROVIDER_SITE_OTHER): Payer: Medicare Other | Admitting: Psychiatry

## 2020-11-30 ENCOUNTER — Other Ambulatory Visit: Payer: Self-pay

## 2020-11-30 ENCOUNTER — Encounter (HOSPITAL_COMMUNITY): Payer: Self-pay | Admitting: Psychiatry

## 2020-11-30 VITALS — Wt 168.0 lb

## 2020-11-30 DIAGNOSIS — F3162 Bipolar disorder, current episode mixed, moderate: Secondary | ICD-10-CM | POA: Diagnosis not present

## 2020-11-30 DIAGNOSIS — F4310 Post-traumatic stress disorder, unspecified: Secondary | ICD-10-CM | POA: Diagnosis not present

## 2020-11-30 DIAGNOSIS — F419 Anxiety disorder, unspecified: Secondary | ICD-10-CM | POA: Diagnosis not present

## 2020-11-30 MED ORDER — ARIPIPRAZOLE 15 MG PO TABS: 15.0000 mg | ORAL_TABLET | Freq: Every day | ORAL | 1 refills | Status: DC

## 2020-11-30 MED ORDER — LAMOTRIGINE 200 MG PO TABS: 200.0000 mg | ORAL_TABLET | Freq: Every day | ORAL | 1 refills | Status: DC

## 2020-11-30 MED ORDER — DIAZEPAM 2 MG PO TABS: 2.0000 mg | ORAL_TABLET | Freq: Every day | ORAL | 0 refills | Status: DC | PRN

## 2020-11-30 NOTE — Progress Notes (Signed)
Virtual Visit via Telephone Note  I connected with Justin [name] on 11/30/20 at  9:40 AM EDT by telephone and verified that I am speaking with the correct person using two identifiers.  Location: Patient: Home Provider: Home Office   I discussed the limitations, risks, security and privacy concerns of performing an evaluation and management service by telephone and the availability of in person appointments. I also discussed with the patient that there may be a patient responsible charge related to this service. The patient expressed understanding and agreed to proceed.   History of Present Illness: Patient is evaluated by phone session.  Today he is tired and did not slept last night.  Patient told his younger sister who died in 13s had a birthday yesterday and he was thinking about her.  He admitted family situation makes him more anxious and tired and sometimes he thinks about his family a lot.  Now he had decided to distancing him from his son and family members because he feels that they do not include him anyway.  He is very sad because son has not been to his house in more than 3 years.  He reported every time when they need some help they call him.  Patient also in therapy with Janett Billow.  We have discussed in the past that he needs to accept the fact to live his own life and now patient thinking about not too worried about his family at all.  He denies any mania, psychosis, hallucination.  He denies any suicidal thoughts.  He feels the current medicine is working and does not want to change.  He feels proud that and there are days when he has not taken Valium.  He admitted whenever he thinks about his family that he becomes more depressed, anxious and cannot sleep.  His weight is stable.  He denies any drug use or drinking.  He does walk every day with the dog.  He reported nightmares and flashbacks of almost resolved and denies any recent flashbacks.  He has no rash or any itching.    Past  Psychiatric History:Reviewed. H/O suicidal attemptby overdose on Ambientwice, hydroxyzine and alcohol. H/Obipolar disorder with multipleinpatient. DidIOP. LastER visit Sept 2020 and[facility]patient[facility].Tried Zyprexa, Cymbalta, Seroquel, Vistaril, Neurontin,Wellbutrin and triptal.Tried low-dose doxepinandtrazodone.Had a reaction with Ativan and pain medication. SawDr. Letta Moynahan inpast.  Psychiatric Specialty Exam: Physical Exam  Review of Systems  Weight 168 lb (76.2 kg).There is no height or weight on file to calculate BMI.  General Appearance: NA  Eye Contact:  NA  Speech:  Slow  Volume:  Decreased  Mood:  Anxious and tired  Affect:  NA  Thought Process:  Descriptions of Associations: Intact  Orientation:  Full (Time, Place, and Person)  Thought Content:  Rumination  Suicidal Thoughts:  No  Homicidal Thoughts:  No  Memory:  Immediate;   Good Recent;   Good Remote;   Fair  Judgement:  Intact  Insight:  Present  Psychomotor Activity:  NA  Concentration:  Concentration: Fair and Attention Span: Fair  Recall:  Good  Fund of Knowledge:  Good  Language:  Good  Akathisia:  No  Handed:  Right  AIMS (if indicated):     Assets:  Communication Skills Desire for Improvement Housing Transportation  ADL's:  Intact  Cognition:  WNL  Sleep:   fair      Assessment and Plan: Bipolar disorder type I.  PTSD.  Anxiety.  Discussed his chronic triggers and family situation.  Reintegrate to keep appointment with therapy for better coping skills.  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  Continue Valium 2 mg daily as needed for anxiety, Lamictal 200 mg daily, Abilify 15 mg daily.  Recommended to call us back if is any question or any concern.  Patient like to follow-up in 2 months.    Follow Up Instructions:    I discussed the assessment and treatment plan with  the patient. The patient was provided an opportunity to ask questions and all were answered. The patient agreed with the plan and demonstrated an understanding of the instructions.   The patient was advised to call back or seek an in-person evaluation if the symptoms worsen or if the condition fails to improve as anticipated.  I provided 16 minutes of non-face-to-face time during this encounter.   Kathlee Nations, MD

## 2020-12-09 ENCOUNTER — Ambulatory Visit (INDEPENDENT_AMBULATORY_CARE_PROVIDER_SITE_OTHER): Payer: Medicare Other | Admitting: Licensed Clinical Social Worker

## 2020-12-09 ENCOUNTER — Other Ambulatory Visit: Payer: Self-pay

## 2020-12-09 ENCOUNTER — Encounter (HOSPITAL_COMMUNITY): Payer: Self-pay | Admitting: Licensed Clinical Social Worker

## 2020-12-09 DIAGNOSIS — F3162 Bipolar disorder, current episode mixed, moderate: Secondary | ICD-10-CM | POA: Diagnosis not present

## 2020-12-09 NOTE — Progress Notes (Signed)
Virtual Visit via Telephone Note  I connected with Justin [name] on 12/09/20 at 12:30 PM EDT by telephone and verified that I am speaking with the correct person using two identifiers.  Location: Patient: home Provider: home office   I discussed the limitations, risks, security and privacy concerns of performing an evaluation and management service by telephone and the availability of in person appointments. I also discussed with the patient that there may be a patient responsible charge related to this service. The patient expressed understanding and agreed to proceed.  Type of Therapy: Individual Therapy   Treatment Goals addressed: "I'm really worried about my sleep issues and how they are effecting my memory and my mind, having more hallucinations, feeling out of it". Justin [name] will improve psychological functioning as evidenced by reduced experience of psychoses, improvement in sleep, and more stabilized mood 3 out of 7 days.   Interventions: CBT   Summary: Justin [name] is a 75 y.o. male who presents with Bipolar 1 Disorder, Mixed, Moderate   Suicidal/Homicidal: No - without intent/plan   Therapist Response: [place] met with clinician for an individual session. Justin [name] discussed his psychiatric symptoms, his current life events and his homework. He shared continuing frustration with the way his son acts toward him. Clinician processed recent events and utilized CBT to process his own thoughts, feelings, and behaviors. Clinician assisted Justin [name] in reframing his thoughts about these situations, and offering alternatives to being offended or getting his feelings hurt. Clinician discussed possible thoughts from his son about putting too much on Justin [name] in caring for the kids. However, Justin [name] has not communicated his feelings at all about this, he just lets it simmer inside. Clinician explored Justin [name]'s fears that son would become angry and possibly withdraw from [place], which is why he lives on eggshells  with son. Clinician reflected that when the time is right, he will say something to his son. Clinician encouraged Justin [name] to think it through and possibly write a script of what to say to his son.    Plan: Return again in 2-3 weeks.  Diagnosis: Axis I: Bipolar 1 Disorder, Mixed, Moderate      I discussed the assessment and treatment plan with the patient. The patient was provided an opportunity to ask questions and all were answered. The patient agreed with the plan and demonstrated an understanding of the instructions.   The patient was advised to call back or seek an in-person evaluation if the symptoms worsen or if the condition fails to improve as anticipated.  I provided 45 minutes of non-face-to-face time during this encounter.   [name], LCSW

## 2020-12-14 ENCOUNTER — Ambulatory Visit: Payer: Medicare Other | Admitting: Physician Assistant

## 2020-12-18 ENCOUNTER — Ambulatory Visit: Payer: Medicare Other | Admitting: Internal Medicine

## 2020-12-18 ENCOUNTER — Ambulatory Visit: Payer: Medicare Other

## 2020-12-22 ENCOUNTER — Other Ambulatory Visit: Payer: Self-pay

## 2020-12-23 ENCOUNTER — Encounter: Payer: Self-pay | Admitting: Internal Medicine

## 2020-12-23 ENCOUNTER — Ambulatory Visit: Payer: Medicare Other | Admitting: Internal Medicine

## 2020-12-23 ENCOUNTER — Other Ambulatory Visit: Payer: Self-pay | Admitting: Internal Medicine

## 2020-12-23 ENCOUNTER — Ambulatory Visit (INDEPENDENT_AMBULATORY_CARE_PROVIDER_SITE_OTHER): Payer: Medicare Other | Admitting: Licensed Clinical Social Worker

## 2020-12-23 ENCOUNTER — Ambulatory Visit: Payer: Medicare Other

## 2020-12-23 VITALS — BP 154/78 | HR 76 | Temp 98.1°F | Ht 72.0 in | Wt 168.6 lb

## 2020-12-23 DIAGNOSIS — E538 Deficiency of other specified B group vitamins: Secondary | ICD-10-CM

## 2020-12-23 DIAGNOSIS — E7800 Pure hypercholesterolemia, unspecified: Secondary | ICD-10-CM

## 2020-12-23 DIAGNOSIS — R739 Hyperglycemia, unspecified: Secondary | ICD-10-CM | POA: Diagnosis not present

## 2020-12-23 DIAGNOSIS — F411 Generalized anxiety disorder: Secondary | ICD-10-CM

## 2020-12-23 DIAGNOSIS — F4321 Adjustment disorder with depressed mood: Secondary | ICD-10-CM

## 2020-12-23 DIAGNOSIS — Z0001 Encounter for general adult medical examination with abnormal findings: Secondary | ICD-10-CM

## 2020-12-23 DIAGNOSIS — I10 Essential (primary) hypertension: Secondary | ICD-10-CM | POA: Diagnosis not present

## 2020-12-23 DIAGNOSIS — F3162 Bipolar disorder, current episode mixed, moderate: Secondary | ICD-10-CM | POA: Diagnosis not present

## 2020-12-23 DIAGNOSIS — E559 Vitamin D deficiency, unspecified: Secondary | ICD-10-CM

## 2020-12-23 LAB — LIPID PANEL
Cholesterol: 141 mg/dL (ref 0–200)
HDL: 69.3 mg/dL (ref >39.00)
LDL Cholesterol: 63 mg/dL (ref 0–99)
NonHDL: 71.31
Total CHOL/HDL Ratio: 2
Triglycerides: 40 mg/dL (ref 0.0–149.0)
VLDL: 8 mg/dL (ref 0.0–40.0)

## 2020-12-23 LAB — HEPATIC FUNCTION PANEL
ALT: 30 U/L (ref 0–53)
AST: 38 U/L — ABNORMAL HIGH (ref 0–37)
Albumin: 4.4 g/dL (ref 3.5–5.2)
Alkaline Phosphatase: 57 U/L (ref 39–117)
Bilirubin, Direct: 0.1 mg/dL (ref 0.0–0.3)
Total Bilirubin: 0.5 mg/dL (ref 0.2–1.2)
Total Protein: 6.6 g/dL (ref 6.0–8.3)

## 2020-12-23 LAB — HEMOGLOBIN A1C: Hgb A1c MFr Bld: 4.7 % (ref 4.6–6.5)

## 2020-12-23 LAB — CBC WITH DIFFERENTIAL/PLATELET
Basophils Absolute: 0 10*3/uL (ref 0.0–0.1)
Basophils Relative: 0.7 % (ref 0.0–3.0)
Eosinophils Absolute: 0 10*3/uL (ref 0.0–0.7)
Eosinophils Relative: 0.9 % (ref 0.0–5.0)
HCT: 41.4 % (ref 39.0–52.0)
Hemoglobin: 13.9 g/dL (ref 13.0–17.0)
Lymphocytes Relative: 16.9 % (ref 12.0–46.0)
Lymphs Abs: 0.7 10*3/uL (ref 0.7–4.0)
MCHC: 33.6 g/dL (ref 30.0–36.0)
MCV: 97.7 fl (ref 78.0–100.0)
Monocytes Absolute: 0.4 10*3/uL (ref 0.1–1.0)
Monocytes Relative: 9.4 % (ref 3.0–12.0)
Neutro Abs: 3 10*3/uL (ref 1.4–7.7)
Neutrophils Relative %: 72.1 % (ref 43.0–77.0)
Platelets: 248 10*3/uL (ref 150.0–400.0)
RBC: 4.23 Mil/uL (ref 4.22–5.81)
RDW: 12.5 % (ref 11.5–15.5)
WBC: 4.1 10*3/uL (ref 4.0–10.5)

## 2020-12-23 LAB — BASIC METABOLIC PANEL
BUN: 20 mg/dL (ref 6–23)
CO2: 28 mEq/L (ref 19–32)
Calcium: 9.5 mg/dL (ref 8.4–10.5)
Chloride: 101 mEq/L (ref 96–112)
Creatinine, Ser: 0.81 mg/dL (ref 0.40–1.50)
GFR: 86.8 mL/min (ref >60.00)
Glucose, Bld: 98 mg/dL (ref 70–99)
Potassium: 4.1 mEq/L (ref 3.5–5.1)
Sodium: 135 mEq/L (ref 135–145)

## 2020-12-23 LAB — URINALYSIS, ROUTINE W REFLEX MICROSCOPIC
Bilirubin Urine: NEGATIVE
Hgb urine dipstick: NEGATIVE
Ketones, ur: NEGATIVE
Leukocytes,Ua: NEGATIVE
Nitrite: NEGATIVE
Specific Gravity, Urine: 1.015 (ref 1.000–1.030)
Total Protein, Urine: NEGATIVE
Urine Glucose: NEGATIVE
Urobilinogen, UA: 0.2 (ref 0.0–1.0)
pH: 6 (ref 5.0–8.0)

## 2020-12-23 LAB — VITAMIN B12: Vitamin B-12: 283 pg/mL (ref 211–911)

## 2020-12-23 LAB — TSH: TSH: 0.7 u[IU]/mL (ref 0.35–4.50)

## 2020-12-23 LAB — PSA: PSA: 2.09 ng/mL (ref 0.10–4.00)

## 2020-12-23 LAB — VITAMIN D 25 HYDROXY (VIT D DEFICIENCY, FRACTURES): VITD: 27.62 ng/mL — ABNORMAL LOW (ref 30.00–100.00)

## 2020-12-23 MED ORDER — ALPRAZOLAM 1 MG PO TABS: ORAL_TABLET | ORAL | 2 refills | Status: DC

## 2020-12-23 MED ORDER — THERA-D 2000 50 MCG (2000 UT) PO TABS: ORAL_TABLET | ORAL | 99 refills | Status: DC

## 2020-12-23 NOTE — Patient Instructions (Signed)
Please continue to monitor your BP at home, with the goal being to be at least less than 140/90   Please take all new medication as prescribed - the xanax   Please continue all other medications as before, and refills have been done if requested.  Please have the pharmacy call with any other refills you may need.  Please continue your efforts at being more active, low cholesterol diet, and weight control.  You are otherwise up to date with prevention measures today.  Please keep your appointments with your specialists as you may have planned  Please go to the LAB at the blood drawing area for the tests to be done  You will be contacted by phone if any changes need to be made immediately.  Otherwise, you will receive a letter about your results with an explanation, but please check with MyChart first.  Please remember to sign up for MyChart if you have not done so, as this will be important to you in the future with finding out test results, communicating by private email, and scheduling acute appointments online when needed.  Please make an Appointment to return in 6 months, or sooner if needed, also with Lab Appointment for testing done 3-5 days before at the [facility] (so this is for TWO appointments - please see the scheduling desk as you leave)  Due to the ongoing Covid 19 pandemic, our lab now requires an appointment for any labs done at our office.  If you need labs done and do not have an appointment, please call our office ahead of time to schedule before presenting to the lab for your testing.

## 2020-12-23 NOTE — Progress Notes (Signed)
Patient ID: Justin [name], male   DOB: 1946-08-15, 75 y.o.   MRN: [id number]         Chief Complaint:: wellness exam and htn, anxiety, depression       HPI:  Justin [name] is a 75 y.o. male here for wellness exam; up to date with preventive referrals and immunizations                        Also s/p covid infection 10-02-2019.  Wife died now 39 yrs ago and still grieving to some degree, really bothers him.  BP elevated today, has been < 140/90 at home but not checked recently, but willing to restart.  Denies worsening depressive symptoms, suicidal ideation, or panic; has ongoing anxiety, now increased recently as well with multiple social stressors.  Pt denies chest pain, increased sob or doe, wheezing, orthopnea, PND, increased LE swelling, palpitations, dizziness or syncope.   Pt denies polydipsia, polyuria, or new focal neuro s/s.   Pt denies fever, wt loss, night sweats, loss of appetite, or other constitutional symptoms  No other new complaints.      Wt Readings from Last 3 Encounters:  12/23/20 168 lb 9.6 oz (76.5 kg)  04/20/20 161 lb (73 kg)  11/21/19 171 lb 9.6 oz (77.8 kg)   BP Readings from Last 3 Encounters:  12/23/20 (!) 154/78  09/03/20 (!) 167/90  04/20/20 114/82   Immunization History  Administered Date(s) Administered  . Fluad Quad(high Dose 65+) 05/15/2019  . Influenza, High Dose Seasonal PF 05/05/2016, 05/22/2018  . Influenza,inj,Quad PF,6+ Mos 06/26/2015  . PFIZER(Purple Top)SARS-COV-2 Vaccination 10/24/2019, 11/19/2019, 05/04/2020  . Pneumococcal Conjugate-13 08/07/2015, 05/05/2016  . Pneumococcal Polysaccharide-23 06/09/2016  . Td 03/26/2015  . Zoster 03/26/2015  . Zoster Recombinat (Shingrix) 10/03/2016   There are no preventive care reminders to display for this patient.    Past Medical History:  Diagnosis Date  . Anxiety and depression   . Arthritis    knees, c-spine // gets lumbar ESI q 6 mos  . Bipolar disorder ([place])   . CAD (coronary artery disease)     [place] 2009/10/01 >> LHC ([facility]) - mLAD 70, Dx 65 >> PCI:  3x15 mm Endeavor DES to mLAD and 2.5x12 mm Endeavor DES to Dx // S/p NSTEMI >> LHC 8/12 ([facility]) - LM ok, LAD prox and mid 40; LAD stent ok, Dx stent ok, mRCA 30, mLCx 50 >> med Rx // ETT-Echo 2/17 ([facility]): Normal, EF 55-60 at rest  . Chicken pox   . 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 Grief   . History of echocardiogram    Echo 3/19: Mild concentric LVH, EF 60-65, normal wall motion, grade 1 diastolic dysfunction, mild AI, mildly dilated aortic root (40 mm), MAC, mild LAE, atrial septal lipomatous hypertrophy  . History of non-ST elevation myocardial infarction (NSTEMI)   . History of nuclear stress test    Nuclear stress test 3/19: EF 57, inferior/inferoseptal/inferolateral defect consistent with probable soft tissue attenuation (cannot exclude subendocardial scar), no ischemia, intermediate risk >> Echo 3/19 normal EF, normal wall motion  . History of pneumonia 2011  . History of prostatitis 1990s  . Hx of adenomatous colonic polyps 06/22/2016  . Hyperlipidemia   . Hypertension   . Migraines    prior history  . PTSD (post-traumatic stress disorder)    Past Surgical History:  Procedure Laterality Date  . CERVICAL DISCECTOMY    . COLONOSCOPY  03/2017  .  ELBOW SURGERY Left   . ELBOW SURGERY Right   . INGUINAL HERNIA REPAIR Bilateral    1996, 1997  . KNEE ARTHROSCOPY Right   . KNEE CARTILAGE SURGERY Left   . NASAL SEPTUM SURGERY    . STENT PLACEMENT VASCULAR ([facility])  09/02/2010  . TONSILLECTOMY    . TOTAL KNEE ARTHROPLASTY Left 08/31/2018   Procedure: TOTAL KNEE ARTHROPLASTY;  Surgeon: Dorna Leitz, MD;  Location: [facility];  Service: Orthopedics;  Laterality: Left;    reports that he has never smoked. He has never used smokeless tobacco. He reports that he does not drink alcohol and does not use drugs. family history includes Alzheimer's disease in his mother; Arthritis in his father and mother; Depression in his  brother; Diabetes in his sister and sister; Heart attack in his father, mother, and sister; Heart disease in his father and mother; Hyperlipidemia in his father; Hypertension in his father and mother; Lung cancer in his maternal aunt, paternal aunt, and paternal grandmother; Multiple sclerosis in his sister; Pulmonary embolism in his sister; Stroke in his father. Allergies  Allergen Reactions  . Ambien [Zolpidem Tartrate] Other (See Comments)    Blackout, memory issues  . Seroquel [Quetiapine]     SEVERE NIGHTMARES, SLEEPWALK AND NIGHT DRIVE WITH NO RECOLLECTION UPON WAKENING   Current Outpatient Medications on File Prior to Visit  Medication Sig Dispense Refill  . ARIPiprazole (ABILIFY) 15 MG tablet Take 1 tablet (15 mg total) by mouth daily. 30 tablet 1  . clopidogrel (PLAVIX) 75 MG tablet TAKE 1 TABLET BY MOUTH DAILY 30 tablet 11  . diltiazem (CARDIZEM CD) 180 MG 24 hr capsule TAKE 1 CAPSULE BY MOUTH DAILY 30 capsule 11  . lamoTRIgine (LAMICTAL) 200 MG tablet Take 1 tablet (200 mg total) by mouth daily. For mood control 30 tablet 1  . nitroGLYCERIN (NITROSTAT) 0.4 MG SL tablet Place 1 tablet (0.4 mg total) under the tongue every 5 (five) minutes as needed for chest pain. 25 tablet 3  . rosuvastatin (CRESTOR) 20 MG tablet Take 1 tablet (20 mg total) by mouth daily. Please schedule for future refills. Thank you! 90 tablet 0   No current facility-administered medications on file prior to visit.        ROS:  All others reviewed and negative.  Objective        PE:  BP (!) 154/78 (BP Location: Right Arm, Patient Position: Sitting, Cuff Size: Normal)   Pulse 76   Temp 98.1 F (36.7 C) (Oral)   Ht 6' (1.829 m)   Wt 168 lb 9.6 oz (76.5 kg)   SpO2 97%   BMI 22.87 kg/m                 Constitutional: Pt appears in NAD               HENT: Head: NCAT.                Right Ear: External ear normal.                 Left Ear: External ear normal.                Eyes: . Pupils are equal,  round, and reactive to light. Conjunctivae and EOM are normal               Nose: without d/c or deformity               Neck: Neck supple.  Gross normal ROM               Cardiovascular: Normal rate and regular rhythm.                 Pulmonary/Chest: Effort normal and breath sounds without rales or wheezing.                Abd:  Soft, NT, ND, + BS, no organomegaly               Neurological: Pt is alert. At baseline orientation, motor grossly intact               Skin: Skin is warm. No rashes, no other new lesions, LE edema - none               Psychiatric: Pt behavior is normal without agitation   Micro: none  Cardiac tracings I have personally interpreted today:  none  Pertinent Radiological findings (summarize): none   Lab Results  Component Value Date   WBC 4.1 12/23/2020   HGB 13.9 12/23/2020   HCT 41.4 12/23/2020   PLT 248.0 12/23/2020   GLUCOSE 98 12/23/2020   CHOL 141 12/23/2020   TRIG 40.0 12/23/2020   HDL 69.30 12/23/2020   LDLCALC 63 12/23/2020   ALT 30 12/23/2020   AST 38 (H) 12/23/2020   NA 135 12/23/2020   K 4.1 12/23/2020   CL 101 12/23/2020   CREATININE 0.81 12/23/2020   BUN 20 12/23/2020   CO2 28 12/23/2020   TSH 0.70 12/23/2020   PSA 2.09 12/23/2020   INR 0.95 08/21/2018   HGBA1C 4.7 12/23/2020   Assessment/Plan:  Justin [name] is a 75 y.o. 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  Encounter for well adult exam with abnormal findings Age and sex appropriate education and counseling updated with  regular exercise and diet Referrals for preventative services - none needed Immunizations addressed - none needed Smoking counseling  - none needed Evidence for depression or other mood disorder - has worsening anxiety, for xanax prn as psychitrist will rx klonpin or nothing per pt and preferred the xanax Most recent labs reviewed. I have personally reviewed and have noted: 1) the patient's medical and social history 2) The patient's current medications and supplements 3) The patient's height, weight, and BMI have been recorded in the chart   Hypertension, uncontrolled Mild uncontrolled today, but for f/u BP at home and call in 10 days  Hyperlipidemia Lab Results  Component Value Date   LDLCALC 63 12/23/2020   Stable, pt to continue current statin crestor 20   Hyperglycemia Lab Results  Component Value Date   HGBA1C 4.7 12/23/2020   Stable, pt to continue current medical treatment  - diet   Generalized anxiety disorder For xanax prn,  to f/u any worsening symptoms or concerns  Vitamin D deficiency Last vitamin D Lab Results  Component Value Date   VD25OH 27.62 (L) 12/23/2020   Low, to start oral replacement   Followup: Return in about 6 months (around 06/25/2021).  Cathlean Cower, MD 12/26/2020 9:55 PM [facility]

## 2020-12-24 ENCOUNTER — Encounter (HOSPITAL_COMMUNITY): Payer: Self-pay | Admitting: Licensed Clinical Social Worker

## 2020-12-24 NOTE — Progress Notes (Signed)
Virtual Visit via Telephone Note  I connected with Justin [name] on 12/24/20 at  2:30 PM EDT by telephone and verified that I am speaking with the correct person using two identifiers.  Location: Patient: home Provider: home office   I discussed the limitations, risks, security and privacy concerns of performing an evaluation and management service by telephone and the availability of in person appointments. I also discussed with the patient that there may be a patient responsible charge related to this service. The patient expressed understanding and agreed to proceed.  Type of Therapy: Individual Therapy   Treatment Goals addressed: "I'm really worried about my sleep issues and how they are effecting my memory and my mind, having more hallucinations, feeling out of it". Justin [name] will improve psychological functioning as evidenced by reduced experience of psychoses, improvement in sleep, and more stabilized mood 3 out of 7 days.   Interventions: CBT   Summary: Justin [name] is a 75 y.o. male who presents with Bipolar 1 Disorder, Mixed, Moderate   Suicidal/Homicidal: No - without intent/plan   Therapist Response: Josealberto met with clinician for an individual session. Justin [name] discussed his psychiatric symptoms, his current life events and his homework. He shared that he has been concerned about his son's behavior toward him, but noted that he has decided to back off and let his son come to him. Clinician utilized CBT to explore thoughts, feelings and behaviors that have led him to this conclusion. Clinician noted that he cannot control anyone else's behavior or choices, especially not his son, who is an adult. Clinician discussed ways to manage his own life and to include some self-compassion and joy into his life without depending on his children. Clinician explored ways to get more involved in the community to pull his focus away from the issues that he finds irritating or depressing.      Plan: Return again in 2-3 weeks.  Diagnosis: Axis I: Bipolar 1 Disorder, Mixed, Moderate       I discussed the assessment and treatment plan with the patient. The patient was provided an opportunity to ask questions and all were answered. The patient agreed with the plan and demonstrated an understanding of the instructions.   The patient was advised to call back or seek an in-person evaluation if the symptoms worsen or if the condition fails to improve as anticipated.  I provided 45 minutes of non-face-to-face time during this encounter.   [name], LCSW

## 2020-12-25 ENCOUNTER — Ambulatory Visit: Payer: Medicare Other

## 2020-12-26 ENCOUNTER — Encounter: Payer: Self-pay | Admitting: Internal Medicine

## 2020-12-26 DIAGNOSIS — E559 Vitamin D deficiency, unspecified: Secondary | ICD-10-CM | POA: Insufficient documentation

## 2020-12-26 NOTE — Assessment & Plan Note (Signed)
Lab Results  Component Value Date   HGBA1C 4.7 12/23/2020   Stable, pt to continue current medical treatment  - diet

## 2020-12-26 NOTE — Assessment & Plan Note (Signed)
Last vitamin D Lab Results  Component Value Date   VD25OH 27.62 (L) 12/23/2020   Low, to start oral replacement

## 2020-12-26 NOTE — Assessment & Plan Note (Signed)
Lab Results  °Component Value Date  ° LDLCALC 63 12/23/2020  ° °Stable, pt to continue current statin crestor 20 ° °

## 2020-12-26 NOTE — Assessment & Plan Note (Signed)
Mild uncontrolled today, but for f/u BP at home and call in 10 days

## 2020-12-26 NOTE — Assessment & Plan Note (Signed)
Age and sex appropriate education and counseling updated with regular exercise and diet Referrals for preventative services - none needed Immunizations addressed - none needed Smoking counseling  - none needed Evidence for depression or other mood disorder - has worsening anxiety, for xanax prn as psychitrist will rx klonpin or nothing per pt and preferred the xanax Most recent labs reviewed. I have personally reviewed and have noted: 1) the patient's medical and social history 2) The patient's current medications and supplements 3) The patient's height, weight, and BMI have been recorded in the chart

## 2020-12-26 NOTE — Assessment & Plan Note (Signed)
For xanax prn,  to f/u any worsening symptoms or concerns 

## 2020-12-31 ENCOUNTER — Ambulatory Visit: Payer: Medicare Other

## 2021-01-06 ENCOUNTER — Other Ambulatory Visit: Payer: Self-pay

## 2021-01-06 ENCOUNTER — Ambulatory Visit (INDEPENDENT_AMBULATORY_CARE_PROVIDER_SITE_OTHER): Payer: Medicare Other | Admitting: Licensed Clinical Social Worker

## 2021-01-06 DIAGNOSIS — F3162 Bipolar disorder, current episode mixed, moderate: Secondary | ICD-10-CM

## 2021-01-07 ENCOUNTER — Encounter (HOSPITAL_COMMUNITY): Payer: Self-pay | Admitting: Licensed Clinical Social Worker

## 2021-01-07 NOTE — Progress Notes (Signed)
Virtual Visit via Telephone Note  I connected with Justin [name] on 01/07/21 at  2:30 PM EDT by telephone and verified that I am speaking with the correct person using two identifiers.  Location: Patient: home Provider: home office   I discussed the limitations, risks, security and privacy concerns of performing an evaluation and management service by telephone and the availability of in person appointments. I also discussed with the patient that there may be a patient responsible charge related to this service. The patient expressed understanding and agreed to proceed.  Type of Therapy: Individual Therapy   Treatment Goals addressed: "I'm really worried about my sleep issues and how they are effecting my memory and my mind, having more hallucinations, feeling out of it". Justin [name] will improve psychological functioning as evidenced by reduced experience of psychoses, improvement in sleep, and more stabilized mood 3 out of 7 days.   Interventions: Motivational Interviewing   Summary: Justin [name] is a 75 y.o. male who presents with Bipolar 1 Disorder, Mixed, Moderate   Suicidal/Homicidal: No - without intent/plan   Therapist Response: Yisroel met with clinician for an individual session. Justin [name] discussed his psychiatric symptoms, his current life events and his homework. Justin [name] shared he continues to have depression and grief over the relationship with his son and grandchildren. He reports he has not seen his daughter-in-law or older granddaughter since February, which is odd because they live next door to each other. Clinician explored options for Justin [name] to invite them out to dinner or to the house for a BBQ. Clinician also discussed that Justin [name] has done a lot for his son and family, but they have not changed their attitude toward him much. Justin [name] processed an issue with sister, where he lent his car to her while hers was in the shop. Clinician noted that this causes angst because he is very willing  to help and put himself on hold to help his family. However, he shared the sense that if he needed help, they would not do the same for him. Clinician validated this feeling and noted that this may be true. Clinician posed two options: accept what it is and continue to be present and available for his family in this way, or stop giving them what they ask for and feel less responsible to fix their problems. Clinician reflected that he hurts himself by feeling used by his family.    Plan: Return again in 2-3 weeks.  Diagnosis: Axis I: Bipolar 1 Disorder, Mixed, Moderate      I discussed the assessment and treatment plan with the patient. The patient was provided an opportunity to ask questions and all were answered. The patient agreed with the plan and demonstrated an understanding of the instructions.   The patient was advised to call back or seek an in-person evaluation if the symptoms worsen or if the condition fails to improve as anticipated.  I provided 50 minutes of non-face-to-face time during this encounter.   Justin Curling, LCSW

## 2021-01-12 ENCOUNTER — Ambulatory Visit: Payer: Medicare Other

## 2021-01-20 ENCOUNTER — Other Ambulatory Visit: Payer: Self-pay

## 2021-01-20 ENCOUNTER — Ambulatory Visit (HOSPITAL_COMMUNITY): Payer: Medicare Other | Admitting: Licensed Clinical Social Worker

## 2021-01-22 ENCOUNTER — Ambulatory Visit: Payer: Medicare Other

## 2021-02-03 ENCOUNTER — Other Ambulatory Visit: Payer: Self-pay

## 2021-02-03 ENCOUNTER — Telehealth (INDEPENDENT_AMBULATORY_CARE_PROVIDER_SITE_OTHER): Payer: Medicare Other | Admitting: Psychiatry

## 2021-02-03 ENCOUNTER — Encounter (HOSPITAL_COMMUNITY): Payer: Self-pay | Admitting: Psychiatry

## 2021-02-03 VITALS — Wt 168.0 lb

## 2021-02-03 DIAGNOSIS — F3162 Bipolar disorder, current episode mixed, moderate: Secondary | ICD-10-CM | POA: Diagnosis not present

## 2021-02-03 DIAGNOSIS — F419 Anxiety disorder, unspecified: Secondary | ICD-10-CM | POA: Diagnosis not present

## 2021-02-03 DIAGNOSIS — F4310 Post-traumatic stress disorder, unspecified: Secondary | ICD-10-CM | POA: Diagnosis not present

## 2021-02-03 MED ORDER — LAMOTRIGINE 200 MG PO TABS: 200.0000 mg | ORAL_TABLET | Freq: Every day | ORAL | 2 refills | Status: DC

## 2021-02-03 MED ORDER — ARIPIPRAZOLE 15 MG PO TABS: 15.0000 mg | ORAL_TABLET | Freq: Every day | ORAL | 2 refills | Status: DC

## 2021-02-03 NOTE — Progress Notes (Signed)
Virtual Visit via Telephone Note  I connected with Justin [name] on 02/03/21 at 10:40 AM EDT by telephone and verified that I am speaking with the correct person using two identifiers.  Location: Patient: Home Provider: Home Office   I discussed the limitations, risks, security and privacy concerns of performing an evaluation and management service by telephone and the availability of in person appointments. I also discussed with the patient that there may be a patient responsible charge related to this service. The patient expressed understanding and agreed to proceed.   History of Present Illness: Patient is evaluated by phone session.  He is sleeping Abilify and Lamictal.  He recently seen his PCP and requested to go back on Xanax.  He feels taking the Xanax helping him to fall asleep and he noticed more relaxed at night.  He also changed his sleeping habit and going to bed early and waking up early.  He still have a lot of ruminative and negative thoughts.  Whenever he thinks about his son, grandkids he gets sad.  He was disappointed because he did not invited on a cookout done by his son.  Patient told they usually call him when they need babysitting or watch their dog.  Patient told he saw his older granddaughter after many months recently.  He is in therapy with Janett Billow.  Lately he had hurt his right hand while doing yard work and now he is using braces but he is still have pain.  He saw orthopedic doctor who recommended to keep the braces for 2 weeks and if it did not get better then he may need an MRI.  He is not taking any pain medicine.  He is trying to lose weight and eating healthy.  Denies drinking or using any illegal substances.  He denies any mania or psychosis.    Past Psychiatric History: Reviewed. H/O suicidal attempt by overdose on Ambien twice, hydroxyzine and alcohol. H/O bipolar disorder with multiple inpatient.  Did IOP. Last ER visit Sept 2020 and inpatient October 2018 at  [facility]. Tried Zyprexa, Cymbalta, Seroquel, Vistaril, Neurontin, Wellbutrin and triptal. Tried low-dose doxepin and trazodone. Had a reaction with Ativan and pain medication.  Saw Dr. Letta Moynahan in past.    Recent Results (from the past 2160 hour(s))  VITAMIN D 25 Hydroxy (Vit-D Deficiency, Fractures)     Status: Abnormal   Collection Time: 12/23/20 11:13 AM  Result Value Ref Range   VITD 27.62 (L) 30.00 - 100.00 ng/mL  Vitamin B12     Status: None   Collection Time: 12/23/20 11:13 AM  Result Value Ref Range   Vitamin B-12 283 211 - 911 pg/mL  Basic metabolic panel     Status: None   Collection Time: 12/23/20 11:13 AM  Result Value Ref Range   Sodium 135 135 - 145 mEq/L   Potassium 4.1 3.5 - 5.1 mEq/L   Chloride 101 96 - 112 mEq/L   CO2 28 19 - 32 mEq/L   Glucose, Bld 98 70 - 99 mg/dL   BUN 20 6 - 23 mg/dL   Creatinine, Ser 0.81 0.40 - 1.50 mg/dL   GFR 86.80 >60.00 mL/min    Comment: Calculated using the CKD-EPI Creatinine Equation (2021)   Calcium 9.5 8.4 - 10.5 mg/dL  PSA     Status: None   Collection Time: 12/23/20 11:13 AM  Result Value Ref Range   PSA 2.09 0.10 - 4.00 ng/mL    Comment: Test performed using Access [employer]  PSA Assay, a parmagnetic partical, chemiluminecent immunoassay.  Urinalysis, Routine w reflex microscopic     Status: None   Collection Time: 12/23/20 11:13 AM  Result Value Ref Range   Color, Urine YELLOW Yellow;Lt. Yellow;Straw;Dark Yellow;Amber;Green;Red;Brown   APPearance CLEAR Clear;Turbid;Slightly Cloudy;Cloudy   Specific Gravity, Urine 1.015 1.000 - 1.030   pH 6.0 5.0 - 8.0   Total Protein, Urine NEGATIVE Negative   Urine Glucose NEGATIVE Negative   Ketones, ur NEGATIVE Negative   Bilirubin Urine NEGATIVE Negative   Hgb urine dipstick NEGATIVE Negative   Urobilinogen, UA 0.2 0.0 - 1.0   Leukocytes,Ua NEGATIVE Negative   Nitrite NEGATIVE Negative   WBC, UA 0-2/hpf 0-2/hpf   RBC / HPF 0-2/hpf 0-2/hpf   Squamous Epithelial / LPF  Rare(0-4/hpf) Rare(0-4/hpf)  TSH     Status: None   Collection Time: 12/23/20 11:13 AM  Result Value Ref Range   TSH 0.70 0.35 - 4.50 uIU/mL  CBC with Differential/Platelet     Status: None   Collection Time: 12/23/20 11:13 AM  Result Value Ref Range   WBC 4.1 4.0 - 10.5 K/uL   RBC 4.23 4.22 - 5.81 Mil/uL   Hemoglobin 13.9 13.0 - 17.0 g/dL   HCT 41.4 39.0 - 52.0 %   MCV 97.7 78.0 - 100.0 fl   MCHC 33.6 30.0 - 36.0 g/dL   RDW 12.5 11.5 - 15.5 %   Platelets 248.0 150.0 - 400.0 K/uL   Neutrophils Relative % 72.1 43.0 - 77.0 %   Lymphocytes Relative 16.9 12.0 - 46.0 %   Monocytes Relative 9.4 3.0 - 12.0 %   Eosinophils Relative 0.9 0.0 - 5.0 %   Basophils Relative 0.7 0.0 - 3.0 %   Neutro Abs 3.0 1.4 - 7.7 K/uL   Lymphs Abs 0.7 0.7 - 4.0 K/uL   Monocytes Absolute 0.4 0.1 - 1.0 K/uL   Eosinophils Absolute 0.0 0.0 - 0.7 K/uL   Basophils Absolute 0.0 0.0 - 0.1 K/uL  Hepatic function panel     Status: Abnormal   Collection Time: 12/23/20 11:13 AM  Result Value Ref Range   Total Bilirubin 0.5 0.2 - 1.2 mg/dL   Bilirubin, Direct 0.1 0.0 - 0.3 mg/dL   Alkaline Phosphatase 57 39 - 117 U/L   AST 38 (H) 0 - 37 U/L   ALT 30 0 - 53 U/L   Total Protein 6.6 6.0 - 8.3 g/dL   Albumin 4.4 3.5 - 5.2 g/dL  Lipid panel     Status: None   Collection Time: 12/23/20 11:13 AM  Result Value Ref Range   Cholesterol 141 0 - 200 mg/dL    Comment: ATP III Classification       Desirable:  < 200 mg/dL               Borderline High:  200 - 239 mg/dL          High:  > = 240 mg/dL   Triglycerides 40.0 0.0 - 149.0 mg/dL    Comment: Normal:  <150 mg/dLBorderline High:  150 - 199 mg/dL   HDL 69.30 >39.00 mg/dL   VLDL 8.0 0.0 - 40.0 mg/dL   LDL Cholesterol 63 0 - 99 mg/dL   Total CHOL/HDL Ratio 2     Comment:                Men          Women1/2 Average Risk     3.4  3.3Average Risk          5.0          4.42X Average Risk          9.6          7.13X Average Risk          15.0          11.0                        NonHDL 71.31     Comment: NOTE:  Non-HDL goal should be 30 mg/dL higher than patient's LDL goal (i.e. LDL goal of < 70 mg/dL, would have non-HDL goal of < 100 mg/dL)  Hemoglobin A1c     Status: None   Collection Time: 12/23/20 11:13 AM  Result Value Ref Range   Hgb A1c MFr Bld 4.7 4.6 - 6.5 %    Comment: Glycemic Control Guidelines for People with Diabetes:Non Diabetic:  <6%Goal of Therapy: <7%Additional Action Suggested:  >8%      Psychiatric Specialty Exam: Physical Exam  Review of Systems  Weight 168 lb (76.2 kg).There is no height or weight on file to calculate BMI.  General Appearance: NA  Eye Contact:  NA  Speech:  Slow  Volume:  Decreased  Mood:  Dysphoric  Affect:  NA  Thought Process:  Descriptions of Associations: Intact  Orientation:  Full (Time, Place, and Person)  Thought Content:  Rumination  Suicidal Thoughts:  No  Homicidal Thoughts:  No  Memory:  Immediate;   Good Recent;   Good Remote;   Fair  Judgement:  Fair  Insight:  Shallow  Psychomotor Activity:  NA  Concentration:  Concentration: Fair and Attention Span: Fair  Recall:  Good  Fund of Knowledge:  Good  Language:  Good  Akathisia:  No  Handed:  Right  AIMS (if indicated):     Assets:  Communication Skills Desire for Improvement Housing Transportation  ADL's:  Intact  Cognition:  WNL  Sleep:   better      Assessment and Plan: Bipolar disorder type I.  PTSD.  Anxiety.  I reviewed blood work results and current medication.  He is no longer taking Valium as his PCP started him on Xanax 1 mg to take half to 1 tablet as needed which he feels helping his sleep and keeping his anxiety under control.  I discussed in length about his chronic triggers and family situation.  I encouraged him to try going to gym or [employer] to keep himself busy.  We also talked about keeping a distance from his son as he feels very sad whenever they do not invite him on occasions.  His therapist recommended to take  the class of photography and is thinking to join as patient level photography.  We discussed not to change the medication as they are working okay but he need to find other opportunities to keep himself busy.  We will continue Lamictal 200 mg daily, Abilify 15 mg daily.  He is getting Xanax from his PCP.  Recommended to call us back if there is any question or any concern.  Follow-up in 3 months.  Follow Up Instructions:    I discussed the assessment and treatment plan with the patient. The patient was provided an opportunity to ask questions and all were answered. The patient agreed with the plan and demonstrated an understanding of the instructions.   The patient was advised to call back or  seek an in-person evaluation if the symptoms worsen or if the condition fails to improve as anticipated.  I provided 18 minutes of non-face-to-face time during this encounter.   Kathlee Nations, MD

## 2021-02-04 ENCOUNTER — Ambulatory Visit (HOSPITAL_COMMUNITY): Payer: Medicare Other | Admitting: Licensed Clinical Social Worker

## 2021-02-04 ENCOUNTER — Other Ambulatory Visit: Payer: Self-pay

## 2021-02-17 ENCOUNTER — Ambulatory Visit (HOSPITAL_COMMUNITY): Payer: Medicare Other | Admitting: Licensed Clinical Social Worker

## 2021-02-17 ENCOUNTER — Other Ambulatory Visit: Payer: Self-pay

## 2021-02-19 ENCOUNTER — Ambulatory Visit: Payer: Medicare Other | Admitting: Internal Medicine

## 2021-02-22 NOTE — Progress Notes (Signed)
Cardiology Office Note:    Date:  02/23/2021   ID:  [name], DOB 10-24-1945, MRN [id number]  PCP:  Biagio Borg, MD   [facility] Providers Cardiologist:  Sherren Mocha, MD Cardiology APP:  Sharmon Revere     Referring MD: Biagio Borg, MD   Chief Complaint:  Follow-up (CAD) and Chest Pain    Patient Profile:    Justin [name] is a 75 y.o. male with:  Coronary artery disease with stable angina since PCI Previously followed by Dr. Bettina Gavia in [place] - est w [place] office in 3/19 Unstable angina >> s/p DES to the LAD and DX in 2011 NSTEMI 03/2011 >> no culprit on cath; medical therapy Stress echo 2017: Normal Myoview 3/19: no ischemia Echocardiogram 3/19: EF 60-65, Gr 1 DD Hypertension Hyperlipidemia Anxiety/depression  Prior CV studies: Echo 11/17/17 Mild conc LVH, EF 60-65, no RWMA, Gr 1 DD, mildly calcified AV leaflets, mild AI, mildly dilated aortic root (40 mm), MAC, trivial MR, mild LAE, mild RV dilation, atrial septal lipomatous hypertrophy   Nuclear stress test 11/17/17 EF 47, inf/inf-sept/inf-lat defect c/w probable soft tissue atten, cannot exclude subendocardial scar, no ischemia; Intermediate Risk   History of Present Illness: Mr. Scurlock was last seen in 05/2019.  He returns for f/u.  He is here alone.  Over the past several months, he has had substernal chest discomfort.  This can occur with exertion and at rest.  He had one episode severe enough 5 months ago that he took nitroglycerin.  He had relief with this.  He has been under a lot of stress recently and attributes it all to that.  He has not had significant shortness of breath, orthopnea, syncope.  He does have some swelling in his right ankle due to possibly gout.  He has not had pleuritic symptoms.        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[place])    Grief    History of echocardiogram    Echo 3/19: Mild concentric LVH, EF 60-65, normal wall motion, grade 1 diastolic dysfunction, mild AI, mildly dilated aortic root (40 mm), MAC, mild LAE, atrial septal lipomatous hypertrophy   History of non-ST elevation myocardial infarction (NSTEMI)    History of nuclear stress test    Nuclear stress test 3/19: EF 57, inferior/inferoseptal/inferolateral defect consistent with probable soft tissue attenuation (cannot exclude subendocardial scar), no ischemia, intermediate risk >> Echo 3/19 normal EF, normal wall motion   History of pneumonia 2011   History of prostatitis 1990s   Hx of adenomatous colonic polyps 06/22/2016   Hyperlipidemia    Hypertension    Migraines    prior history   PTSD (post-traumatic stress disorder)     Current Medications: Current Meds  Medication Sig   ALPRAZolam (XANAX) 1 MG tablet 1/2  to 1 tab by mouth once daily   ARIPiprazole (ABILIFY) 15 MG tablet Take 1 tablet (15 mg total) by mouth daily.   Cholecalciferol (THERA-D 2000) 50 MCG (2000 UT) TABS 1 tab by mouth once daily   clopidogrel (PLAVIX) 75 MG tablet TAKE 1 TABLET BY MOUTH DAILY   diltiazem (CARDIZEM CD) 180 MG 24 hr capsule TAKE 1 CAPSULE BY MOUTH DAILY   isosorbide mononitrate (IMDUR) 30 MG 24 hr tablet Take 1 tablet (30 mg total) by mouth daily.   lamoTRIgine (LAMICTAL) 200 MG tablet Take 1 tablet (200 mg total) by mouth daily. For mood control   nitroGLYCERIN (NITROSTAT) 0.4 MG SL tablet Place 1 tablet (0.4 mg total) under the tongue every 5 (five) minutes as needed for chest pain.   [DISCONTINUED]  rosuvastatin (CRESTOR) 20 MG tablet Take 1 tablet (20 mg total) by mouth daily. Please schedule for future refills. Thank you!     Allergies:   Ambien [zolpidem tartrate], Ativan [lorazepam], and Seroquel [quetiapine]   Social History   Tobacco Use   Smoking status: Never   Smokeless tobacco: Never  Vaping Use   Vaping Use: Never used  Substance Use Topics   Alcohol use: No    Alcohol/week: 0.0 standard drinks   Drug use: No     Family Hx: The patient's family history includes Alzheimer's disease in his mother; Arthritis in his father and mother; Depression in his brother; Diabetes in his sister and sister; Heart attack in his father, mother, and sister; Heart disease in his father and mother; Hyperlipidemia in his father; Hypertension in his father and mother; Lung cancer in his maternal aunt, paternal aunt, and paternal grandmother; Multiple sclerosis in his sister; Pulmonary embolism in his sister; Stroke in his father.  Review of Systems  Cardiovascular:  Negative for claudication.  Musculoskeletal:  Positive for joint pain and joint swelling (R ankle).  Gastrointestinal:  Negative for hematochezia and melena.  Genitourinary:  Negative for hematuria.  Psychiatric/Behavioral:         +increased stress    EKGs/Labs/Other Test Reviewed:    EKG:  EKG is  ordered today.  The ekg ordered today demonstrates normal sinus rhythm, heart rate 70, left axis deviation, no ST-T wave changes, incomplete right bundle branch block, QTC 432  Recent Labs: 12/23/2020: ALT 30; BUN 20; Creatinine, Ser 0.81; Hemoglobin 13.9; Platelets 248.0; Potassium 4.1; Sodium 135; TSH 0.70   Recent Lipid Panel Lab Results  Component Value Date/Time   CHOL 141 12/23/2020 11:13 AM   TRIG 40.0 12/23/2020 11:13 AM   HDL 69.30 12/23/2020 11:13 AM   LDLCALC 63 12/23/2020 11:13 AM      Risk Assessment/Calculations:      Physical Exam:    VS:  BP (!) 152/70   Pulse 70   Ht 5' 10.75" (1.797 m)   Wt 165  lb 6.4 oz (75 kg)   SpO2 98%   BMI 23.23 kg/m     Wt Readings from Last 3 Encounters:  02/23/21 165 lb 6.4 oz (75 kg)  12/23/20 168 lb 9.6 oz (76.5 kg)  04/20/20 161 lb (73 kg)     Constitutional:      Appearance: Healthy appearance. Not in distress.  Neck:     Vascular: JVD normal.  Pulmonary:     Effort: Pulmonary effort is normal.     Breath sounds: No wheezing. No rales.  Cardiovascular:     Normal rate. Regular rhythm. Normal S1. Normal S2.  Murmurs: There is no murmur.     No rub.  Edema:    Ankle: trace edema of the right ankle. Abdominal:     Palpations: Abdomen is soft. There is no hepatomegaly.  Skin:    General: Skin is warm and dry.  Neurological:     General: No focal deficit present.     Mental Status: Alert and oriented to person, place and time.     Cranial Nerves: Cranial nerves are intact.        ASSESSMENT & PLAN:    1. Coronary artery disease involving native coronary artery of native heart with angina pectoris ([place]) 2. Precordial chest pain History of drug-eluting stent to the LAD and diagonal 2011.  He suffered a non-ST elevation myocardial infarction in 2012 without culprit lesion and he has been managed medically.  Stress test in 2019 was negative for ischemia.  He has had chest symptoms over the past 6 months.  It may be getting more frequent.  He has not really had to take nitroglycerin except for on 1 occasion.  His EKG does not demonstrate any significant changes.  I have recommended proceeding with stress testing to further assess his symptoms.  Also, arrange follow-up echocardiogram.  He does not take PDE-5 inhibitors.  Therefore, I will start him on isosorbide 30 mg daily.  Continue current dose of diltiazem, clopidogrel, rosuvastatin.  Follow-up 6-8 weeks.  3. Essential hypertension Blood pressure above target.  I will start him on isosorbide as noted.  Continue current dose of diltiazem.  4. Hyperlipidemia, unspecified hyperlipidemia  type LDL optimal on most recent lab work.  Continue current Rx.    5. 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  Arrange follow-up echocardiogram.   Shared Decision Making/Informed Consent The risks [chest pain, shortness of breath, cardiac arrhythmias, dizziness, blood pressure fluctuations, myocardial infarction, stroke/transient ischemic attack, nausea, vomiting, allergic reaction, radiation exposure, metallic taste sensation and life-threatening complications (estimated to be 1 in 10,000)], benefits (risk stratification, diagnosing coronary artery disease, treatment guidance) and alternatives of a nuclear stress test were discussed in detail with Mr. Jacuinde and he agrees to proceed.  Dispo:  Return in about 8 weeks (around 04/20/2021) for Follow up after testing, w/ Richardson Dopp, PA-C.   Medication Adjustments/Labs and Tests Ordered: Current medicines are reviewed at length with the patient today.  Concerns regarding medicines are outlined above.  Tests Ordered: Orders Placed This Encounter  Procedures   Cardiac Stress Test: Informed Consent Details: Physician/Practitioner Attestation; Transcribe to consent form and obtain patient signature   Myocardial Perfusion Imaging   EKG 12-Lead   ECHOCARDIOGRAM COMPLETE   Medication Changes: Meds ordered this encounter  Medications   isosorbide mononitrate (IMDUR) 30 MG 24 hr tablet    Sig: Take 1 tablet (30 mg total) by mouth daily.    Dispense:  30 tablet    Refill:  11   rosuvastatin (CRESTOR) 20 MG tablet    Sig: One tablet by mouth ( 20 mg) daily.    Dispense:  30 tablet    Refill:  [address] Richardson Dopp, [place]  02/23/2021 5:47 PM    [facility] [place], [name], [place]  [address] Phone: [phone]; Fax: [phone]

## 2021-02-23 ENCOUNTER — Other Ambulatory Visit: Payer: Self-pay

## 2021-02-23 ENCOUNTER — Ambulatory Visit: Payer: Medicare Other | Admitting: Physician Assistant

## 2021-02-23 ENCOUNTER — Encounter: Payer: Self-pay | Admitting: Physician Assistant

## 2021-02-23 VITALS — BP 152/70 | HR 70 | Ht 70.75 in | Wt 165.4 lb

## 2021-02-23 DIAGNOSIS — E785 Hyperlipidemia, unspecified: Secondary | ICD-10-CM | POA: Diagnosis not present

## 2021-02-23 DIAGNOSIS — R072 Precordial pain: Secondary | ICD-10-CM | POA: Diagnosis not present

## 2021-02-23 DIAGNOSIS — I10 Essential (primary) hypertension: Secondary | ICD-10-CM

## 2021-02-23 DIAGNOSIS — I25119 Atherosclerotic heart disease of native coronary artery with unspecified angina pectoris: Secondary | ICD-10-CM | POA: Diagnosis not present

## 2021-02-23 DIAGNOSIS — I77810 Thoracic aortic ectasia: Secondary | ICD-10-CM

## 2021-02-23 MED ORDER — ISOSORBIDE MONONITRATE ER 30 MG PO TB24: 30.0000 mg | ORAL_TABLET | Freq: Every day | ORAL | 11 refills | Status: DC

## 2021-02-23 MED ORDER — ROSUVASTATIN CALCIUM 20 MG PO TABS: ORAL_TABLET | ORAL | 11 refills | Status: DC

## 2021-02-23 NOTE — Patient Instructions (Signed)
Medication Instructions:   START IMDUR one tablet by mouth ( 30 mg) daily.   *If you need a refill on your cardiac medications before your next appointment, please call your pharmacy*  Lab Work:   -NONE  If you have labs (blood work) drawn today and your tests are completely normal, you will receive your results only by: [place] (if you have MyChart) OR A paper copy in the mail If you have any lab test that is abnormal or we need to change your treatment, we will call you to review the results.   Testing/Procedures: You are scheduled for a Myocardial Perfusion Imaging Study on Thursday, July 21  at 8:30 am.   Please arrive 15 minutes prior to your appointment time for registration and insurance purposes.   The test will take approximately 3 to 4 hours to complete; you may bring reading material. If someone comes with you to your appointment, they will need to remain in the main lobby due to limited space in the testing area.   How to prepare for your Myocardial Perfusion test:   Do not eat or drink 3 hours prior to your test, except you may have water.    Do not consume products containing caffeine (regular or decaffeinated) 12 hours prior to your test (ex: coffee, chocolate, soda, tea)   Do bring a list of your current medications with you. If not listed below, you may take your medications as normal.    Bring any held medication to your appointment, as you may be required to take it once the test is complete.   Do wear comfortable clothes (overalls) and walking shoes. Tennis shoes are preferred. No open toed shoes.  Do not wear cologne, aftershave or lotions (deodorant is allowed).   If these instructions are not followed, you test will have to be rescheduled.   Please report to [address] for your test. If you have questions or concerns about your appointment, please call the Nuclear Lab at [phone].  If you cannot keep your  appointment, please provide 24 hour notification to the Nuclear lab to avoid a possible $50 charge to your account.    Your physician has requested that you have an echocardiogram. Tuesday, AUGUST 2 @ 8:35 AM. Echocardiography is a painless test that uses sound waves to create images of your heart. It provides your doctor with information about the size and shape of your heart and how well your heart's chambers and valves are working. This procedure takes approximately one hour. There are no restrictions for this procedure.     Follow-Up: At [facility], you and your health needs are our priority.  As part of our continuing mission to provide you with exceptional heart care, we have created designated Provider Care Teams.  These Care Teams include your primary Cardiologist (physician) and Advanced Practice Providers (APPs -  Physician Assistants and Nurse Practitioners) who all work together to provide you with the care you need, when you need it.  We recommend signing up for the patient portal called "MyChart".  Sign up information is provided on this After Visit Summary.  MyChart is used to connect with patients for Virtual Visits (Telemedicine).  Patients are able to view lab/test results, encounter notes, upcoming appointments, etc.  Non-urgent messages can be sent to your provider as well.   To learn more about what you can do with MyChart, go to [email or web address].    Your next appointment:  7 week(s) WITH Richardson Dopp, PA-C on Tuesday, September 13 @ 8:45 am.   The format for your next appointment:   In Person  Provider:   Richardson Dopp, PA-C   Other Instructions

## 2021-03-03 ENCOUNTER — Other Ambulatory Visit: Payer: Self-pay | Admitting: Internal Medicine

## 2021-03-05 ENCOUNTER — Other Ambulatory Visit: Payer: Self-pay | Admitting: Internal Medicine

## 2021-03-09 ENCOUNTER — Ambulatory Visit (INDEPENDENT_AMBULATORY_CARE_PROVIDER_SITE_OTHER): Payer: Medicare Other | Admitting: Licensed Clinical Social Worker

## 2021-03-09 ENCOUNTER — Encounter (HOSPITAL_COMMUNITY): Payer: Self-pay | Admitting: Licensed Clinical Social Worker

## 2021-03-09 ENCOUNTER — Other Ambulatory Visit: Payer: Self-pay

## 2021-03-09 ENCOUNTER — Telehealth: Payer: Self-pay

## 2021-03-09 ENCOUNTER — Telehealth (HOSPITAL_COMMUNITY): Payer: Self-pay

## 2021-03-09 DIAGNOSIS — F3162 Bipolar disorder, current episode mixed, moderate: Secondary | ICD-10-CM | POA: Diagnosis not present
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
## 2021-03-09 NOTE — Telephone Encounter (Signed)
Spoke with the patient, detailed instructions given. He stated that he understood and would be here for his test. Asked to call back with any questions. S.Kelyn Ponciano EMTP 

## 2021-03-09 NOTE — Telephone Encounter (Signed)
Sent to provider/MA to advise as pt has stated he went to refill his rx for ALPRAZolam Duanne Moron) 1 MG tablet and was told his provider did not approve the refill request. Pt last refilled this rx on 12/23/2020.  *Pt states he is on his last pill and on his med list it states do not fill till 03/20/2021.

## 2021-03-09 NOTE — Telephone Encounter (Signed)
Unfortunately we cannot be responsible for medication overused and not taken as prescribed  Last rx was for #30 with 2 refills sent on May 3  Clearly he should not be running out if taken as prescribed  No early refills, sorry

## 2021-03-10 NOTE — Telephone Encounter (Signed)
LVM for pt of Dr. Gwynn Burly note.

## 2021-03-11 ENCOUNTER — Other Ambulatory Visit: Payer: Self-pay

## 2021-03-11 ENCOUNTER — Ambulatory Visit (HOSPITAL_COMMUNITY): Payer: Medicare Other | Attending: Internal Medicine

## 2021-03-11 DIAGNOSIS — E785 Hyperlipidemia, unspecified: Secondary | ICD-10-CM | POA: Diagnosis not present

## 2021-03-11 DIAGNOSIS — I25119 Atherosclerotic heart disease of native coronary artery with unspecified angina pectoris: Secondary | ICD-10-CM | POA: Diagnosis present

## 2021-03-11 DIAGNOSIS — R072 Precordial pain: Secondary | ICD-10-CM | POA: Insufficient documentation

## 2021-03-11 DIAGNOSIS — I77810 Thoracic aortic ectasia: Secondary | ICD-10-CM | POA: Diagnosis present

## 2021-03-11 DIAGNOSIS — I10 Essential (primary) hypertension: Secondary | ICD-10-CM | POA: Diagnosis not present

## 2021-03-11 LAB — MYOCARDIAL PERFUSION IMAGING
LV dias vol: 142 mL (ref 62–150)
LV sys vol: 52 mL
Peak HR: 79 {beats}/min
Rest HR: 61 {beats}/min
SDS: 3
SRS: 1
SSS: 4
TID: 0.93

## 2021-03-11 MED ORDER — REGADENOSON 0.4 MG/5ML IV SOLN
0.4000 mg | Freq: Once | INTRAVENOUS | Status: AC
  Administered 2021-03-11: 0.4 mg via INTRAVENOUS

## 2021-03-11 MED ORDER — TECHNETIUM TC 99M TETROFOSMIN IV KIT
10.8000 | PACK | Freq: Once | INTRAVENOUS | Status: AC | PRN
  Administered 2021-03-11: 10.8 via INTRAVENOUS
  Filled 2021-03-11: qty 11

## 2021-03-11 MED ORDER — TECHNETIUM TC 99M TETROFOSMIN IV KIT
32.4000 | PACK | Freq: Once | INTRAVENOUS | Status: AC | PRN
  Administered 2021-03-11: 32.4 via INTRAVENOUS
  Filled 2021-03-11: qty 33

## 2021-03-23 ENCOUNTER — Ambulatory Visit (HOSPITAL_COMMUNITY): Payer: Medicare Other | Attending: Cardiology

## 2021-03-23 ENCOUNTER — Encounter: Payer: Self-pay | Admitting: Physician Assistant

## 2021-03-23 ENCOUNTER — Other Ambulatory Visit: Payer: Self-pay

## 2021-03-23 DIAGNOSIS — I25119 Atherosclerotic heart disease of native coronary artery with unspecified angina pectoris: Secondary | ICD-10-CM | POA: Insufficient documentation

## 2021-03-23 DIAGNOSIS — E785 Hyperlipidemia, unspecified: Secondary | ICD-10-CM | POA: Insufficient documentation

## 2021-03-23 DIAGNOSIS — I77810 Thoracic aortic ectasia: Secondary | ICD-10-CM | POA: Diagnosis present

## 2021-03-23 DIAGNOSIS — R072 Precordial pain: Secondary | ICD-10-CM | POA: Insufficient documentation

## 2021-03-23 DIAGNOSIS — I10 Essential (primary) hypertension: Secondary | ICD-10-CM | POA: Insufficient documentation

## 2021-03-23 LAB — ECHOCARDIOGRAM COMPLETE
Area-P 1/2: 3.53 cm2
P 1/2 time: 433 msec
S' Lateral: 3.3 cm

## 2021-03-24 ENCOUNTER — Ambulatory Visit (HOSPITAL_COMMUNITY): Payer: Medicare Other | Admitting: Licensed Clinical Social Worker

## 2021-03-25 ENCOUNTER — Telehealth: Payer: Self-pay | Admitting: Physician Assistant

## 2021-03-25 DIAGNOSIS — I77810 Thoracic aortic ectasia: Secondary | ICD-10-CM

## 2021-03-25 NOTE — Telephone Encounter (Signed)
Liliane Shi, PA-C  03/23/2021  5:34 PM EDT      Echocardiogram demonstrates normal EF.  There is moderate stiffness during relaxation (diastolic dysfunction).  There is mild elevation in pulmonary pressures.  There is mild to moderate leakage of the aortic valve (aortic insufficiency).  There is mild enlargement of the aortic root at 39 mm.  Overall, there have been no significant changes when compared with prior echocardiogram in 2019 other than a slight increase in the leakage of theaortic valve. PLAN:  - Continue current medications/treatment plan and follow up as scheduled.  - Repeat echocardiogram in 1 year - Send copy to PCP Richardson Dopp, PA-C    03/23/2021 5:28 PM    The patient has been notified of the result and verbalized understanding.  All questions (if any) were answered. Antonieta Iba, RN 03/25/2021 8:40 AM

## 2021-03-25 NOTE — Telephone Encounter (Signed)
Pt is returning call for his Echo results. Please advise pt further

## 2021-03-29 ENCOUNTER — Ambulatory Visit: Payer: Medicare Other | Admitting: Internal Medicine

## 2021-03-29 ENCOUNTER — Encounter: Payer: Self-pay | Admitting: Internal Medicine

## 2021-03-29 ENCOUNTER — Other Ambulatory Visit: Payer: Self-pay

## 2021-03-29 VITALS — BP 140/90 | HR 86 | Temp 98.0°F | Ht 70.0 in | Wt 166.0 lb

## 2021-03-29 DIAGNOSIS — F0390 Unspecified dementia without behavioral disturbance: Secondary | ICD-10-CM | POA: Diagnosis not present

## 2021-03-29 DIAGNOSIS — E559 Vitamin D deficiency, unspecified: Secondary | ICD-10-CM

## 2021-03-29 DIAGNOSIS — R739 Hyperglycemia, unspecified: Secondary | ICD-10-CM

## 2021-03-29 DIAGNOSIS — I10 Essential (primary) hypertension: Secondary | ICD-10-CM

## 2021-03-29 NOTE — Assessment & Plan Note (Signed)
Lab Results  Component Value Date   HGBA1C 4.7 12/23/2020   Stable, pt to continue current medical treatment  - diet

## 2021-03-29 NOTE — Progress Notes (Signed)
Chief Complaint: follow up HTN, and hyperglycemia, dementia, vit d deficiency       HPI:  Justin [name] is a 75 y.o. male here overall doing ok, Pt denies chest pain, increased sob or doe, wheezing, orthopnea, PND, increased LE swelling, palpitations, dizziness or syncope.  Pt denies new neurological symptoms such as new headache, or facial or extremity weakness or numbness   Pt denies polydipsia, polyuria, or new focal neuro s/s.  Not taking Vit D.  Dementia overall stable symptomatically, and not assoc with behavioral changes such as hallucinations, paranoia, or agitation.         Wt Readings from Last 3 Encounters:  03/29/21 166 lb (75.3 kg)  03/11/21 165 lb (74.8 kg)  02/23/21 165 lb 6.4 oz (75 kg)   BP Readings from Last 3 Encounters:  03/29/21 140/90  02/23/21 (!) 152/70  12/23/20 (!) 154/78         Past Medical History:  Diagnosis Date   Anxiety and depression    Arthritis    knees, c-spine // gets lumbar ESI q 6 mos   Bipolar disorder ([place])    CAD (coronary artery disease)    [place] 08/2009 >> LHC ([facility]) - mLAD 70, Dx 65 >> PCI:  3x15 mm Endeavor DES to mLAD and 2.5x12 mm Endeavor DES to Dx // S/p NSTEMI >> LHC 8/12 ([facility]) - LM ok, LAD prox and mid 40; LAD stent ok, Dx stent ok, mRCA 30, mLCx 50 >> med Rx // ETT-Echo 2/17 ([facility]): Normal, EF 55-60 at rest   Chicken pox    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[place])    Grief    History of echocardiogram    Echo 3/19: Mild concentric LVH, EF 60-65, normal wall motion, grade 1 diastolic dysfunction, mild AI, mildly dilated aortic root (40 mm), MAC, mild LAE, atrial septal lipomatous hypertrophy // Echo 8/22: EF 55-60, no RWMA, mild LVH, GRII DD, normal RVSF, RVSP 40, mild LAE, trivial MR, mild-moderate AI, AV sclerosis without stenosis, mild dilation of aortic root (39 mm)   History of non-ST elevation myocardial infarction (NSTEMI)    History of nuclear stress test    Nuclear stress test 3/19: EF 57,  inferior/inferoseptal/inferolateral defect consistent with probable soft tissue attenuation (cannot exclude subendocardial scar), no ischemia, intermediate risk >> Echo 3/19 normal EF, normal wall motion   History of pneumonia 2011   History of prostatitis 1990s   Hx of adenomatous colonic polyps 06/22/2016   Hyperlipidemia    Hypertension    Migraines    prior history   PTSD (post-traumatic stress disorder)    Past Surgical History:  Procedure Laterality Date   CERVICAL DISCECTOMY     COLONOSCOPY  03/2017   ELBOW SURGERY Left    ELBOW SURGERY Right    INGUINAL HERNIA REPAIR Bilateral    1996, 1997   KNEE ARTHROSCOPY Right    KNEE CARTILAGE SURGERY Left    NASAL SEPTUM SURGERY     STENT PLACEMENT VASCULAR ([facility] [facility])  09/02/2010   TONSILLECTOMY     TOTAL KNEE ARTHROPLASTY Left 08/31/2018   Procedure: TOTAL KNEE ARTHROPLASTY;  Surgeon: Dorna Leitz, MD;  Location: [facility];  Service: Orthopedics;  Laterality: Left;    reports that he has never smoked. He has never used smokeless tobacco. He reports that he does not drink alcohol and does not use drugs. family history includes Alzheimer's disease in his mother; Arthritis in his father and mother; Depression in  his brother; Diabetes in his sister and sister; Heart attack in his father, mother, and sister; Heart disease in his father and mother; Hyperlipidemia in his father; Hypertension in his father and mother; Lung cancer in his maternal aunt, paternal aunt, and paternal grandmother; Multiple sclerosis in his sister; Pulmonary embolism in his sister; Stroke in his father. Allergies  Allergen Reactions   Ambien [Zolpidem Tartrate] Other (See Comments)    Blackout, memory issues   Ativan [Lorazepam] Other (See Comments)    Made him feel completely out of it fell down   Seroquel [Quetiapine]     SEVERE NIGHTMARES, SLEEPWALK AND NIGHT DRIVE WITH NO RECOLLECTION UPON WAKENING   Current Outpatient Medications on File Prior to Visit   Medication Sig Dispense Refill   ALPRAZolam (XANAX) 1 MG tablet TAKE 1/2 TO 1 TABLET BY MOUTH ONCE DAILY 30 tablet 2   ARIPiprazole (ABILIFY) 15 MG tablet Take 1 tablet (15 mg total) by mouth daily. 30 tablet 2   Cholecalciferol (THERA-D 2000) 50 MCG (2000 UT) TABS 1 tab by mouth once daily 30 tablet 99   clopidogrel (PLAVIX) 75 MG tablet TAKE 1 TABLET BY MOUTH DAILY 30 tablet 11   diltiazem (CARDIZEM CD) 180 MG 24 hr capsule TAKE 1 CAPSULE BY MOUTH DAILY 30 capsule 11   isosorbide mononitrate (IMDUR) 30 MG 24 hr tablet Take 1 tablet (30 mg total) by mouth daily. 30 tablet 11   lamoTRIgine (LAMICTAL) 200 MG tablet Take 1 tablet (200 mg total) by mouth daily. For mood control 30 tablet 2   nitroGLYCERIN (NITROSTAT) 0.4 MG SL tablet Place 1 tablet (0.4 mg total) under the tongue every 5 (five) minutes as needed for chest pain. 25 tablet 3   rosuvastatin (CRESTOR) 20 MG tablet One tablet by mouth ( 20 mg) daily. 30 tablet 11   No current facility-administered medications on file prior to visit.        ROS:  All others reviewed and negative.  Objective        PE:  BP 140/90 (BP Location: Left Arm, Patient Position: Sitting, Cuff Size: Normal)   Pulse 86   Temp 98 F (36.7 C) (Oral)   Ht _0  (1.778 m)   Wt 166 lb (75.3 kg)   SpO2 97%   BMI 23.82 kg/m                 Constitutional: Pt appears in NAD               HENT: Head: NCAT.                Right Ear: External ear normal.                 Left Ear: External ear normal.                Eyes: . Pupils are equal, round, and reactive to light. Conjunctivae and EOM are normal               Nose: without d/c or deformity               Neck: Neck supple. Gross normal ROM               Cardiovascular: Normal rate and regular rhythm.                 Pulmonary/Chest: Effort normal and breath sounds without rales or wheezing.  Abd:  Soft, NT, ND, + BS, no organomegaly               Neurological: Pt is alert. At baseline  orientation, motor grossly intact               Skin: Skin is warm. No rashes, no other new lesions, LE edema - none               Psychiatric: Pt behavior is normal without agitation   Micro: none  Cardiac tracings I have personally interpreted today:  none  Pertinent Radiological findings (summarize): none   Lab Results  Component Value Date   WBC 4.1 12/23/2020   HGB 13.9 12/23/2020   HCT 41.4 12/23/2020   PLT 248.0 12/23/2020   GLUCOSE 98 12/23/2020   CHOL 141 12/23/2020   TRIG 40.0 12/23/2020   HDL 69.30 12/23/2020   LDLCALC 63 12/23/2020   ALT 30 12/23/2020   AST 38 (H) 12/23/2020   NA 135 12/23/2020   K 4.1 12/23/2020   CL 101 12/23/2020   CREATININE 0.81 12/23/2020   BUN 20 12/23/2020   CO2 28 12/23/2020   TSH 0.70 12/23/2020   PSA 2.09 12/23/2020   INR 0.95 08/21/2018   HGBA1C 4.7 12/23/2020   Assessment/Plan:  Justin [name] is a 75 y.o. 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  Vitamin D deficiency Last vitamin D Lab Results  Component Value Date   VD25OH 27.62 (L) 12/23/2020   Low, to start oral replacement   Hypertension, uncontrolled BP Readings from Last 3 Encounters:  03/29/21 140/90  02/23/21 (!) 152/70  12/23/20 (!) 154/78   Borderline uncontrolled, pt to continue medical treatment cardizem, declines med change today   Hyperglycemia Lab Results  Component Value Date   HGBA1C 4.7 12/23/2020   Stable, pt to continue current medical treatment  - diet   Dementia arising in the senium  and presenium ([place]) Persistent, declines tx for now such as aricept Followup: Return in about 13 weeks (around 06/28/2021).  Cathlean Cower, MD 03/29/2021 10:48 PM [facility]

## 2021-03-29 NOTE — Patient Instructions (Signed)
Please take OTC Vitamin D3 at 2000 units per day, indefinitely  Remember the Novavax Covid Vaccine should be coming about October 2022  Please continue all other medications as before, and refills have been done if requested.  Please have the pharmacy call with any other refills you may need.  Please continue your efforts at being more active, low cholesterol diet, and weight control  Please keep your appointments with your specialists as you may have planned

## 2021-03-29 NOTE — Assessment & Plan Note (Signed)
Last vitamin D Lab Results  Component Value Date   VD25OH 27.62 (L) 12/23/2020   Low, to start oral replacement

## 2021-03-29 NOTE — Assessment & Plan Note (Signed)
Persistent, declines tx for now such as aricept

## 2021-03-29 NOTE — Assessment & Plan Note (Signed)
BP Readings from Last 3 Encounters:  03/29/21 140/90  02/23/21 (!) 152/70  12/23/20 (!) 154/78   Borderline uncontrolled, pt to continue medical treatment cardizem, declines med change today

## 2021-04-07 ENCOUNTER — Other Ambulatory Visit: Payer: Self-pay

## 2021-04-07 ENCOUNTER — Ambulatory Visit (INDEPENDENT_AMBULATORY_CARE_PROVIDER_SITE_OTHER): Payer: Medicare Other | Admitting: Licensed Clinical Social Worker

## 2021-04-07 DIAGNOSIS — F3162 Bipolar disorder, current episode mixed, moderate: Secondary | ICD-10-CM | POA: Diagnosis not present

## 2021-04-08 ENCOUNTER — Encounter (HOSPITAL_COMMUNITY): Payer: Self-pay | Admitting: Licensed Clinical Social Worker
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
## 2021-04-21 ENCOUNTER — Ambulatory Visit (INDEPENDENT_AMBULATORY_CARE_PROVIDER_SITE_OTHER): Payer: Medicare Other | Admitting: Licensed Clinical Social Worker

## 2021-04-21 ENCOUNTER — Other Ambulatory Visit: Payer: Self-pay

## 2021-04-21 DIAGNOSIS — F3162 Bipolar disorder, current episode mixed, moderate: Secondary | ICD-10-CM

## 2021-04-22 ENCOUNTER — Encounter (HOSPITAL_COMMUNITY): Payer: Self-pay | Admitting: Licensed Clinical Social Worker
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
## 2021-04-27 ENCOUNTER — Telehealth: Payer: Self-pay

## 2021-04-27 NOTE — Telephone Encounter (Signed)
Pt states he has been taking med as prescribed & is due a refill as he has one pill left. Pt states he will call for refill today. Pt advised that if he continues to take med as prescribed (able to repeat frequency instructions) that he will need to refill monthly; has enough refills to last 43yr Pt verb understanding.

## 2021-05-04 ENCOUNTER — Ambulatory Visit: Payer: Medicare Other | Admitting: Physician Assistant

## 2021-05-04 ENCOUNTER — Telehealth: Payer: Self-pay | Admitting: *Deleted

## 2021-05-04 NOTE — Telephone Encounter (Signed)
S/w pt to move appt due to [place] being out sick. Last Wednesday pt took 1 nitro with relief. Friday had some SOB.  Denies nausea, dizziness, pre syncope.  Pt had normal stress test recent and updated echo.  Pt advised if takes more nitro before OV needs to call office or go to ER. Pt stated is stressed out due to upcoming surgery on ankle. Pt added to DOD schedule for Thursday.

## 2021-05-05 ENCOUNTER — Other Ambulatory Visit: Payer: Self-pay

## 2021-05-05 ENCOUNTER — Ambulatory Visit (HOSPITAL_COMMUNITY): Payer: Medicare Other | Admitting: Licensed Clinical Social Worker

## 2021-05-06 ENCOUNTER — Ambulatory Visit (INDEPENDENT_AMBULATORY_CARE_PROVIDER_SITE_OTHER): Payer: Medicare Other

## 2021-05-06 ENCOUNTER — Encounter: Payer: Self-pay | Admitting: Internal Medicine

## 2021-05-06 ENCOUNTER — Other Ambulatory Visit: Payer: Self-pay

## 2021-05-06 ENCOUNTER — Telehealth (HOSPITAL_COMMUNITY): Payer: Medicare Other | Admitting: Psychiatry

## 2021-05-06 ENCOUNTER — Ambulatory Visit: Payer: Medicare Other | Admitting: Internal Medicine

## 2021-05-06 VITALS — BP 120/70 | HR 77 | Ht 71.0 in | Wt 167.2 lb

## 2021-05-06 DIAGNOSIS — I712 Thoracic aortic aneurysm, without rupture, unspecified: Secondary | ICD-10-CM | POA: Insufficient documentation

## 2021-05-06 DIAGNOSIS — I4891 Unspecified atrial fibrillation: Secondary | ICD-10-CM

## 2021-05-06 DIAGNOSIS — I10 Essential (primary) hypertension: Secondary | ICD-10-CM

## 2021-05-06 DIAGNOSIS — I25119 Atherosclerotic heart disease of native coronary artery with unspecified angina pectoris: Secondary | ICD-10-CM | POA: Diagnosis not present

## 2021-05-06 DIAGNOSIS — I4819 Other persistent atrial fibrillation: Secondary | ICD-10-CM | POA: Insufficient documentation

## 2021-05-06 MED ORDER — APIXABAN 5 MG PO TABS: 5.0000 mg | ORAL_TABLET | Freq: Two times a day (BID) | ORAL | 3 refills | Status: DC

## 2021-05-06 NOTE — Progress Notes (Unsigned)
Patient enrolled for [employer] to mail a 14 day ZIO XT monitor to his address on file.

## 2021-05-06 NOTE — Patient Instructions (Addendum)
Medication Instructions:  Your physician has recommended you make the following change in your medication:  STOP: clopidogrel (Plavix) START: apixaban (Eliquis) 5 mg by mouth twice daily  *If you need a refill on your cardiac medications before your next appointment, please call your pharmacy*   Lab Work: IN 2 WEEKS: CBC If you have labs (blood work) drawn today and your tests are completely normal, you will receive your results only by: [place] (if you have MyChart) OR A paper copy in the mail If you have any lab test that is abnormal or we need to change your treatment, we will call you to review the results.   Testing/Procedures: Your physician has requested that you wear a 14 day heart monitor.   Follow-Up: At [facility], you and your health needs are our priority.  As part of our continuing mission to provide you with exceptional heart care, we have created designated Provider Care Teams.  These Care Teams include your primary Cardiologist (physician) and Advanced Practice Providers (APPs -  Physician Assistants and Nurse Practitioners) who all work together to provide you with the care you need, when you need it.    Your next appointment:   2 -62months)  The format for your next appointment:   In Person  Provider:   You will see one of the following Advanced Practice Providers on your designated Care Team:   SRichardson Dopp PUtah    Other Instructions  ZMitchellMonitor Instructions  Your physician has requested you wear a ZIO patch monitor for 14 days.  This is a single patch monitor. [employer] supplies one patch monitor per enrollment. Additional stickers are not available. Please do not apply patch if you will be having a Nuclear Stress Test,  Echocardiogram, Cardiac CT, MRI, or Chest Xray during the period you would be wearing the  monitor. The patch cannot be worn during these tests. You cannot remove and re-apply the  ZIO XT patch monitor.   Your ZIO patch monitor will be mailed 3 day USPS to your address on file. It may take 3-5 days  to receive your monitor after you have been enrolled.  Once you have received your monitor, please review the enclosed instructions. Your monitor  has already been registered assigning a specific monitor serial # to you.  Billing and Patient Assistance Program Information  We have supplied [employer] with any of your insurance information on file for billing purposes. [employer] offers a sliding scale Patient Assistance Program for patients that do not have  insurance, or whose insurance does not completely cover the cost of the ZIO monitor.  You must apply for the Patient Assistance Program to qualify for this discounted rate.  To apply, please call [employer] at [phone] select option 4, select option 2, ask to apply for  Patient Assistance Program. ITheodore Demarkwill ask your household income, and how many people  are in your household. They will quote your out-of-pocket cost based on that information.  [employer] will also be able to set up a 164-monthinterest-free payment plan if needed.  Applying the monitor   Shave hair from upper left chest.  Hold abrader disc by orange tab. Rub abrader in 40 strokes over the upper left chest as  indicated in your monitor instructions.  Clean area with 4 enclosed alcohol pads. Let dry.  Apply patch as indicated in monitor instructions. Patch will be placed under collarbone on left  side of chest with arrow pointing upward.  Rub patch adhesive wings for 2 minutes. Remove white label marked "1". Remove the white  label marked "2". Rub patch adhesive wings for 2 additional minutes.  While looking in a mirror, press and release button in center of patch. A small green light will  flash 3-4 times. This will be your only indicator that the monitor has been turned on.  Do not shower for the first 24 hours. You may shower after the first 24 hours.  Press the button if  you feel a symptom. You will hear a small click. Record Date, Time and  Symptom in the Patient Logbook.  When you are ready to remove the patch, follow instructions on the last 2 pages of Patient  Logbook. Stick patch monitor onto the last page of Patient Logbook.  Place Patient Logbook in the blue and white box. Use locking tab on box and tape box closed  securely. The blue and white box has prepaid postage on it. Please place it in the mailbox as  soon as possible. Your physician should have your test results approximately 7 days after the  monitor has been mailed back to [facility].  Call [place] [facility] at [phone] if you have questions regarding  your ZIO XT patch monitor. Call them immediately if you see an orange light blinking on your  monitor.  If your monitor falls off in less than 4 days, contact our Monitor department at [phone].  If your monitor becomes loose or falls off after 4 days call [employer] at [phone] for  suggestions on securing your monitor

## 2021-05-06 NOTE — Progress Notes (Signed)
Cardiology Office Note:    Date:  05/06/2021   ID:  Justin [name], DOB 01/08/1946, MRN [id number]  PCP:  Biagio Borg, MD   [facility] Providers Cardiologist:  Sherren Mocha, MD Cardiology APP:  Sharmon [place]vere     Referring MD: Biagio Borg, MD   CC: DOD CP  History of Present Illness:    Justin [name] is a 75 y.o. male with a hx of CAD (DES LAD Lcx 2011), HTN, HLD, mTAA Dementia NOS last seen 02/23/21.  In interim of this visit, patient has had intermittent CP and has had a couple episodes of CP with negative stress test, stable echo from prior. Patient Seen 05/06/21.  Patient notes that he is doing ok.  Since last visit notes that he has had some intermittent chest pains.  They have been relieved with nitroglycerin.  Has had new funny heart beats.  No feelings of syncope or near syncope.  Was at [facility] the other day (had just come back from the orthopedic doctor) and felt a little swimmy headed and felt more SOB.  Has had intermittent episodes of sudden onset short windedness, fatigue.     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[place])    Grief    History of echocardiogram    Echo 3/19: Mild concentric LVH, EF 60-65, normal wall motion, grade 1 diastolic dysfunction, mild AI, mildly dilated aortic root (40 mm), MAC, mild LAE, atrial septal lipomatous hypertrophy // Echo 8/22: EF 55-60, no RWMA, mild LVH, GRII DD, normal RVSF, RVSP 40, mild LAE, trivial MR, mild-moderate AI, AV sclerosis without  stenosis, mild dilation of aortic root (39 mm)   History of non-ST elevation myocardial infarction (NSTEMI)    History of nuclear stress test    Nuclear stress test 3/19: EF 57, inferior/inferoseptal/inferolateral defect consistent with probable soft tissue attenuation (cannot exclude subendocardial scar), no ischemia, intermediate risk >> Echo 3/19 normal EF, normal wall motion   History of pneumonia 2011   History of prostatitis 1990s   Hx of adenomatous colonic polyps 06/22/2016   Hyperlipidemia    Hypertension    Migraines    prior history   PTSD (post-traumatic stress disorder)     Past Surgical History:  Procedure Laterality Date   CERVICAL DISCECTOMY     COLONOSCOPY  03/2017   ELBOW SURGERY Left    ELBOW SURGERY Right    INGUINAL HERNIA REPAIR Bilateral    1996, 1997   KNEE ARTHROSCOPY Right    KNEE CARTILAGE SURGERY Left    NASAL SEPTUM SURGERY     STENT PLACEMENT VASCULAR ([place] [facility])  09/02/2010   TONSILLECTOMY     TOTAL KNEE ARTHROPLASTY Left 08/31/2018   Procedure: TOTAL KNEE ARTHROPLASTY;  Surgeon: Dorna Leitz, MD;  Location: [facility];  Service: Orthopedics;  Laterality: Left;  Current Medications: Current Meds  Medication Sig   ALPRAZolam (XANAX) 1 MG tablet TAKE 1/2 TO 1 TABLET BY MOUTH ONCE DAILY   apixaban (ELIQUIS) 5 MG TABS tablet Take 1 tablet (5 mg total) by mouth 2 (two) times daily.   ARIPiprazole (ABILIFY) 15 MG tablet Take 1 tablet (15 mg total) by mouth daily.   Cholecalciferol (THERA-D 2000) 50 MCG (2000 UT) TABS 1 tab by mouth once daily   diltiazem (CARDIZEM CD) 180 MG 24 hr capsule TAKE 1 CAPSULE BY MOUTH DAILY   isosorbide mononitrate (IMDUR) 30 MG 24 hr tablet Take 1 tablet (30 mg total) by mouth daily.   lamoTRIgine (LAMICTAL) 200 MG tablet Take 1 tablet (200 mg total) by mouth daily. For mood control   nitroGLYCERIN (NITROSTAT) 0.4 MG SL tablet Place 1 tablet (0.4 mg total) under the tongue every 5 (five) minutes as needed for chest pain.    rosuvastatin (CRESTOR) 20 MG tablet One tablet by mouth ( 20 mg) daily.   [DISCONTINUED] clopidogrel (PLAVIX) 75 MG tablet TAKE 1 TABLET BY MOUTH DAILY     Allergies:   Ambien [zolpidem tartrate], Ativan [lorazepam], and Seroquel [quetiapine]   Social History   Socioeconomic History   Marital status: Widowed    Spouse name: Not on file   Number of children: 2   Years of education: 8   Highest education level: Not on file  Occupational History   Occupation: Retired  Tobacco Use   Smoking status: Never   Smokeless tobacco: Never  Vaping Use   Vaping Use: Never used  Substance and Sexual Activity   Alcohol use: No    Alcohol/week: 0.0 standard drinks   Drug use: No   Sexual activity: Not Currently  Other Topics Concern   Not on file  Social History Narrative   Retired, widowed in 2017    1 son / 1 daughter   2 caffeinated beverages daily no alcohol or tobacco   Fun: Work out in the yard.   Denies religious beliefs effecting healthcare.    Worked at [employer] for 30 years   Social Determinants of Radio broadcast assistant Strain: Not on [employer] Insecurity: Not on file  Transportation Needs: Not on file  Physical Activity: Not on file  Stress: Not on file  Social Connections: Not on file    Social: lives alone wife passed away 5-6 years ago has a dog to take care off  Family History: The patient's family history includes Alzheimer's disease in his mother; Arthritis in his father and mother; Depression in his brother; Diabetes in his sister and sister; Heart attack in his father, mother, and sister; Heart disease in his father and mother; Hyperlipidemia in his father; Hypertension in his father and mother; Lung cancer in his maternal aunt, paternal aunt, and paternal grandmother; Multiple sclerosis in his sister; Pulmonary embolism in his sister; Stroke in his father.  ROS:   Please see the history of present illness.     All other systems reviewed and are  negative.  EKGs/Labs/Other Studies Reviewed:    The following studies were reviewed today:  EKG:  EKG is  ordered today.  The ekg ordered today demonstrates  05/06/21: AF rate 77 iRBBB  Transthoracic Echocardiogram: Date: 03/23/21 Results: 1. Left ventricular ejection fraction, by estimation, is 55 to 60%. The  left ventricle has normal function. The left ventricle has no regional  wall motion abnormalities. There is mild left ventricular hypertrophy.  Left ventricular diastolic  parameters  are consistent with Grade II diastolic dysfunction (pseudonormalization).   2. Right ventricular systolic function is normal. The right ventricular  size is mildly enlarged. There is mildly elevated pulmonary artery  systolic pressure. The estimated right ventricular systolic pressure is  53.6 mmHg.   3. Left atrial size was mildly dilated.   4. The mitral valve is normal in structure. Trivial mitral valve  regurgitation. No evidence of mitral stenosis.   5. The aortic valve is tricuspid. Aortic valve regurgitation is mild to  moderate. Mild aortic valve sclerosis is present, with no evidence of  aortic valve stenosis.   6. Aortic dilatation noted. There is mild dilatation of the aortic root,  measuring 39 mm.   7. The inferior vena cava is dilated in size with <50% respiratory  variability, suggesting right atrial pressure of 15 mmHg.    NM Stress Testing : Date:03/11/21 Results: Lexiscan stress is electrically negative for ischemia Myoview scan shows probable normal perfusion and soft tissue attenuation (diaphragm, bowel activity) No significant ischemia or scar LVEF calculated at 64% Low risk study.   Recent Labs: 12/23/2020: ALT 30; BUN 20; Creatinine, Ser 0.81; Hemoglobin 13.9; Platelets 248.0; Potassium 4.1; Sodium 135; TSH 0.70  Recent Lipid Panel    Component Value Date/Time   CHOL 141 12/23/2020 1113   TRIG 40.0 12/23/2020 1113   HDL 69.30 12/23/2020 1113   CHOLHDL 2 12/23/2020  1113   VLDL 8.0 12/23/2020 1113   LDLCALC 63 12/23/2020 1113    Physical Exam:    VS:  BP 120/70   Pulse 77   Ht _0  (1.803 m)   Wt 167 lb 3.2 oz (75.8 kg)   SpO2 99%   BMI 23.32 kg/m     Wt Readings from Last 3 Encounters:  05/06/21 167 lb 3.2 oz (75.8 kg)  03/29/21 166 lb (75.3 kg)  03/11/21 165 lb (74.8 kg)     GEN:  Well nourished, well developed in no acute distress HEENT: Normal NECK: No JVD LYMPHATICS: No lymphadenopathy CARDIAC: IRIR, no murmurs, rubs, gallops +2 pulses RESPIRATORY:  Clear to auscultation without rales, wheezing or rhonchi  ABDOMEN: Soft, non-tender, non-distended MUSCULOSKELETAL:  No edema; No deformity  SKIN: Warm and dry NEUROLOGIC:  Alert and oriented x 3 PSYCHIATRIC:  Normal affect   ASSESSMENT:    1. New onset atrial fibrillation ([place])   2. Coronary artery disease involving native coronary artery of native heart with angina pectoris ([place])   3. Hypertension, uncontrolled   4. Thoracic aortic aneurysm without rupture (HCC)    PLAN:    PAF Coronary Artery Disease; Obstructive HTN, HLD Mild TAA (WNL for Age and BSA) - CHADSVAC 3; will transition plavix to Eliquis; CBC in ~ 2 weeks - was in SR during echo and Stress test;  - continue imdur and rosuvastatin, continue diltiazem - will get ZioPatch for AF burden - presently there are logistic burdens to DCCV; if he tolerates AC ok and has persistent AF we can consider DCCV; if high burden we can consider non 1c guided AAD therapy  Will plan for 2-3 months  follow up unless new symptoms or abnormal test results warranting change in plan ([place])  Would be reasonable for  APP Follow up    Medication Adjustments/Labs and Tests Ordered: Current medicines are reviewed at length with the patient today.  Concerns regarding medicines are outlined above.  Orders Placed This Encounter  Procedures   CBC   LONG TERM MONITOR (3-14 DAYS)  EKG 12-Lead    Meds ordered this encounter   Medications   apixaban (ELIQUIS) 5 MG TABS tablet    Sig: Take 1 tablet (5 mg total) by mouth 2 (two) times daily.    Dispense:  180 tablet    Refill:  3     Patient Instructions  Medication Instructions:  Your physician has recommended you make the following change in your medication:  STOP: clopidogrel (Plavix) START: apixaban (Eliquis) 5 mg by mouth twice daily  *If you need a refill on your cardiac medications before your next appointment, please call your pharmacy*   Lab Work: IN 2 WEEKS: CBC If you have labs (blood work) drawn today and your tests are completely normal, you will receive your results only by: [place] (if you have MyChart) OR A paper copy in the mail If you have any lab test that is abnormal or we need to change your treatment, we will call you to review the results.   Testing/Procedures: Your physician has requested that you wear a 14 day heart monitor.   Follow-Up: At [facility], you and your health needs are our priority.  As part of our continuing mission to provide you with exceptional heart care, we have created designated Provider Care Teams.  These Care Teams include your primary Cardiologist (physician) and Advanced Practice Providers (APPs -  Physician Assistants and Nurse Practitioners) who all work together to provide you with the care you need, when you need it.    Your next appointment:   2 -19months)  The format for your next appointment:   In Person  Provider:   You will see one of the following Advanced Practice Providers on your designated Care Team:   SRichardson Dopp PUtah    Other Instructions  ZMaish VayaMonitor Instructions  Your physician has requested you wear a ZIO patch monitor for 14 days.  This is a single patch monitor. [employer] supplies one patch monitor per enrollment. Additional stickers are not available. Please do not apply patch if you will be having a Nuclear Stress Test,  Echocardiogram,  Cardiac CT, MRI, or Chest Xray during the period you would be wearing the  monitor. The patch cannot be worn during these tests. You cannot remove and re-apply the  ZIO XT patch monitor.  Your ZIO patch monitor will be mailed 3 day USPS to your address on file. It may take 3-5 days  to receive your monitor after you have been enrolled.  Once you have received your monitor, please review the enclosed instructions. Your monitor  has already been registered assigning a specific monitor serial # to you.  Billing and Patient Assistance Program Information  We have supplied [employer] with any of your insurance information on file for billing purposes. [employer] offers a sliding scale Patient Assistance Program for patients that do not have  insurance, or whose insurance does not completely cover the cost of the ZIO monitor.  You must apply for the Patient Assistance Program to qualify for this discounted rate.  To apply, please call [employer] at [phone] select option 4, select option 2, ask to apply for  Patient Assistance Program. ITheodore Demarkwill ask your household income, and how many people  are in your household. They will quote your out-of-pocket cost based on that information.  [employer] will also be able to set up a 173-monthinterest-free payment plan if needed.  Applying the monitor   Shave hair from upper left chest.  Hold  abrader disc by orange tab. Rub abrader in 40 strokes over the upper left chest as  indicated in your monitor instructions.  Clean area with 4 enclosed alcohol pads. Let dry.  Apply patch as indicated in monitor instructions. Patch will be placed under collarbone on left  side of chest with arrow pointing upward.  Rub patch adhesive wings for 2 minutes. Remove white label marked "1". Remove the white  label marked "2". Rub patch adhesive wings for 2 additional minutes.  While looking in a mirror, press and release button in center of patch. A small green light will   flash 3-4 times. This will be your only indicator that the monitor has been turned on.  Do not shower for the first 24 hours. You may shower after the first 24 hours.  Press the button if you feel a symptom. You will hear a small click. Record Date, Time and  Symptom in the Patient Logbook.  When you are ready to remove the patch, follow instructions on the last 2 pages of Patient  Logbook. Stick patch monitor onto the last page of Patient Logbook.  Place Patient Logbook in the blue and white box. Use locking tab on box and tape box closed  securely. The blue and white box has prepaid postage on it. Please place it in the mailbox as  soon as possible. Your physician should have your test results approximately 7 days after the  monitor has been mailed back to [facility].  Call [facility] at [phone] if you have questions regarding  your ZIO XT patch monitor. Call them immediately if you see an orange light blinking on your  monitor.  If your monitor falls off in less than 4 days, contact our Monitor department at [phone].  If your monitor becomes loose or falls off after 4 days call [employer] at [phone] for  suggestions on securing your monitor     Signed, Werner Lean, MD  05/06/2021 11:13 AM    [place]

## 2021-05-06 NOTE — Progress Notes (Signed)
Pt given 30 day supply of eliquis samples.

## 2021-05-07 ENCOUNTER — Encounter (HOSPITAL_COMMUNITY): Payer: Self-pay | Admitting: Psychiatry

## 2021-05-07 ENCOUNTER — Telehealth (INDEPENDENT_AMBULATORY_CARE_PROVIDER_SITE_OTHER): Payer: Medicare Other | Admitting: Psychiatry

## 2021-05-07 DIAGNOSIS — F4310 Post-traumatic stress disorder, unspecified: Secondary | ICD-10-CM

## 2021-05-07 DIAGNOSIS — F3162 Bipolar disorder, current episode mixed, moderate: Secondary | ICD-10-CM

## 2021-05-07 MED ORDER — LAMOTRIGINE 200 MG PO TABS: 200.0000 mg | ORAL_TABLET | Freq: Every day | ORAL | 2 refills | Status: DC

## 2021-05-07 MED ORDER — ARIPIPRAZOLE 15 MG PO TABS: 15.0000 mg | ORAL_TABLET | Freq: Every day | ORAL | 2 refills | Status: DC
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
## 2021-05-08 DIAGNOSIS — I4891 Unspecified atrial fibrillation: Secondary | ICD-10-CM

## 2021-05-19 ENCOUNTER — Ambulatory Visit (HOSPITAL_COMMUNITY): Payer: Medicare Other | Admitting: Licensed Clinical Social Worker

## 2021-05-19 ENCOUNTER — Other Ambulatory Visit: Payer: Self-pay

## 2021-05-20 ENCOUNTER — Other Ambulatory Visit: Payer: Medicare Other | Admitting: *Deleted

## 2021-05-20 ENCOUNTER — Other Ambulatory Visit: Payer: Self-pay

## 2021-05-20 LAB — CBC
Hematocrit: 42.4 % (ref 37.5–51.0)
Hemoglobin: 14.3 g/dL (ref 13.0–17.7)
MCH: 32.1 pg (ref 26.6–33.0)
MCHC: 33.7 g/dL (ref 31.5–35.7)
MCV: 95 fL (ref 79–97)
Platelets: 225 10*3/uL (ref 150–450)
RBC: 4.45 x10E6/uL (ref 4.14–5.80)
RDW: 11.3 % — ABNORMAL LOW (ref 11.6–15.4)
WBC: 5.5 10*3/uL (ref 3.4–10.8)

## 2021-05-21 ENCOUNTER — Telehealth: Payer: Self-pay | Admitting: Internal Medicine

## 2021-05-21 NOTE — Telephone Encounter (Signed)
Called pt in regards to lab work.  I informed him that Dr. Gasper Sells has not reviewed labs yet.  Once labs are reviewed an explanation will be put on my chart for pt to read.  He expressed that eliquis is expensive.  I gave him the number for [facility] pt assistance.  He thanked me for this information.

## 2021-05-21 NOTE — Telephone Encounter (Signed)
Pt is calling in regards to his most recent Lab work. Please advise pt further

## 2021-05-31 ENCOUNTER — Telehealth: Payer: Self-pay | Admitting: Internal Medicine

## 2021-05-31 NOTE — Telephone Encounter (Signed)
Patient wanted to know what the process is for scheduling an Ablation. Please advise

## 2021-05-31 NOTE — Telephone Encounter (Signed)
Spoke with Dr. Gasper Sells prior to calling pt.  He suggest that pt try least invasive procedure before advancing to ablation.  I called pt and informed him of this information.  He is agreeable to plan.  Pt will require a OV prior to DCCV.   Appointment made for 06/03/21 at 9 am with AF clinic. The following instructions were reviewed with pt and also placed on my chart for pt to review.  All questions were answered and pt told to call back with any questions or concerns.   AFIB CLINIC INFORMATION: Your appointment is scheduled on: 06/03/21 at 9 am. Please arrive 15 minutes early for check-in. The AFib Clinic is located in the Heart and Vascular Specialty Clinics at [facility]. Parking instructions/directions: [address] (off [address]). When you pull in to Entrance C, there is an underground parking garage to your right. The code to enter the garage is 3333. Take the elevators to the first floor. Follow the signs to the Heart and Vascular Specialty Clinics. You will see registration at the end of the hallway.  Phone number: [phone]

## 2021-06-02 ENCOUNTER — Telehealth: Payer: Self-pay | Admitting: *Deleted

## 2021-06-02 NOTE — Telephone Encounter (Signed)
   [facility] Pre-operative Risk Assessment    Patient Name: Justin [name]  DOB: [birth year] MRN: [id number]  HEARTCARE STAFF:  - IMPORTANT!!!!!! Under Visit Info/Reason for Call, type in Other and utilize the format Clearance MM/DD/YY or Clearance TBD. Do not use dashes or single digits. - Please review there is not already an duplicate clearance open for this procedure. - If request is for dental extraction, please clarify the # of teeth to be extracted. - If the patient is currently at the dentist's office, call Pre-Op Callback Staff (MA/nurse) to input urgent request.  - If the patient is not currently in the dentist office, please route to the Pre-Op pool.  Request for surgical clearance:  What type of surgery is being performed? RIGHT MEDIAL DISPLACEMENT CALCANEAL OSTEOTOMY, FLEXOR DIGITORUM LONGUS TRANSFER, POSTERIOR TIBIAL TENDON DEBRIDEMENT, SPRING LIGAMENT RECONSTRUCTION, MEDIAL COLUMN PLANTARFLEXION OSTEOTOMY   When is this surgery scheduled? 06/08/21  What type of clearance is required (medical clearance vs. Pharmacy clearance to hold med vs. Both)? BOTH  Are there any medications that need to be held prior to surgery and how long? ELIQUIS x 4-5 DAYS PRIOR TO SURGERY  Practice name and name of physician performing surgery? [facility]; DR. Radene Journey  What is the office phone number? [phone]   7.   What is the office fax number? [phone] ATTN: [place]  8.   Anesthesia type (None, local, MAC, general) ? GENERAL   [name] 06/02/2021, 4:57 PM  _________________________________________________________________   (provider comments below)

## 2021-06-03 ENCOUNTER — Ambulatory Visit (HOSPITAL_COMMUNITY)
Admission: RE | Admit: 2021-06-03 | Discharge: 2021-06-03 | Disposition: A | Discharge status: Home / Self Care | Payer: Medicare Other | Source: Ambulatory Visit | Attending: Physician Assistant | Admitting: Physician Assistant

## 2021-06-03 ENCOUNTER — Encounter (HOSPITAL_COMMUNITY): Payer: Self-pay | Admitting: Physician Assistant

## 2021-06-03 ENCOUNTER — Other Ambulatory Visit: Payer: Self-pay

## 2021-06-03 ENCOUNTER — Ambulatory Visit (INDEPENDENT_AMBULATORY_CARE_PROVIDER_SITE_OTHER): Payer: Medicare Other | Admitting: Licensed Clinical Social Worker

## 2021-06-03 ENCOUNTER — Encounter (HOSPITAL_COMMUNITY): Payer: Self-pay | Admitting: Licensed Clinical Social Worker

## 2021-06-03 VITALS — BP 150/90 | HR 92 | Ht 71.0 in | Wt 173.6 lb

## 2021-06-03 DIAGNOSIS — F3162 Bipolar disorder, current episode mixed, moderate: Secondary | ICD-10-CM

## 2021-06-03 DIAGNOSIS — Z8249 Family history of ischemic heart disease and other diseases of the circulatory system: Secondary | ICD-10-CM | POA: Diagnosis not present

## 2021-06-03 DIAGNOSIS — D6869 Other thrombophilia: Secondary | ICD-10-CM | POA: Insufficient documentation

## 2021-06-03 DIAGNOSIS — Z7901 Long term (current) use of anticoagulants: Secondary | ICD-10-CM | POA: Insufficient documentation

## 2021-06-03 DIAGNOSIS — I10 Essential (primary) hypertension: Secondary | ICD-10-CM | POA: Insufficient documentation

## 2021-06-03 DIAGNOSIS — I4819 Other persistent atrial fibrillation: Secondary | ICD-10-CM | POA: Insufficient documentation

## 2021-06-03 DIAGNOSIS — Z79899 Other long term (current) drug therapy: Secondary | ICD-10-CM | POA: Insufficient documentation

## 2021-06-03 DIAGNOSIS — I2510 Atherosclerotic heart disease of native coronary artery without angina pectoris: Secondary | ICD-10-CM | POA: Insufficient documentation

## 2021-06-03 NOTE — Progress Notes (Signed)
Virtual Visit via Telephone Note  I connected with Justin [name] on 06/03/21 at  2:30 PM EDT by telephone and verified that I am speaking with the correct person using two identifiers.  Location: Patient: home Provider: home office   I discussed the limitations, risks, security and privacy concerns of performing an evaluation and management service by telephone and the availability of in person appointments. I also discussed with the patient that there may be a patient responsible charge related to this service. The patient expressed understanding and agreed to proceed.  Type of Therapy: Individual Therapy   Treatment Goals addressed: "I'm really worried about my sleep issues and how they are effecting my memory and my mind, having more hallucinations, feeling out of it". Justin [name] will improve psychological functioning as evidenced by reduced experience of psychoses, improvement in sleep, and more stabilized mood 3 out of 7 days.   Interventions: Motivational Interviewing   Summary: Justin "Justin [name]" Callanan is a 75 y.o. male who presents with Bipolar 1 Disorder, Mixed, Moderate   Suicidal/Homicidal: No - without intent/plan   Therapist Response: Jorryn met with clinician for an individual session. Justin [name] discussed his psychiatric symptoms, his current life events and his homework. Justin [name] shared he has been very stressed out due to his health and his relationships. Clinician normalized feelings about son's continued lack of connection and caring. Clinician encouraged Justin [name] to seek support this week prior to ankle surgery next week. Clinician noted some improvement and willingness to socialize more with women. Clinician encouraged Justin [name] to keep that thought in mind for after his surgery, so he can work on his loneliness.    Plan: Return again in 2-3 weeks.    Diagnosis: Axis I: Bipolar 1 Disorder, Mixed, Moderate      I discussed the assessment and treatment plan with the patient. The patient was  provided an opportunity to ask questions and all were answered. The patient agreed with the plan and demonstrated an understanding of the instructions.   The patient was advised to call back or seek an in-person evaluation if the symptoms worsen or if the condition fails to improve as anticipated.  I provided 50 minutes of non-face-to-face time during this encounter.   Justin Curling, LCSW

## 2021-06-03 NOTE — Progress Notes (Signed)
Primary Care Physician: Biagio Borg, MD Primary Cardiologist: Dr Gasper Sells Primary Electrophysiologist: none Referring Physician: Dr Ollen Gross is a 75 y.o. male with a history of CAD, HTN, HLD, atrial fibrillation who presents for consultation in the [facility].  The patient was initially diagnosed with atrial fibrillation 05/06/21 at Dr Oralia Rud office. He wore a cardiac monitor which showed 100% rate controlled afib. He does feel palpitations and "swimmy headed" while in afib. He also has more SOB and fatigue. Patient is on Eliquis for a CHADS2VASC score of 4. He is scheduled for ankle surgery 06/08/21 and is holding his Eliquis.   Today, he denies symptoms of chest pain, orthopnea, PND, lower extremity edema, dizziness, presyncope, syncope, snoring, daytime somnolence, bleeding, or neurologic sequela. The patient is tolerating medications without difficulties and is otherwise without complaint today.    Atrial Fibrillation Risk Factors:  he does not have symptoms or diagnosis of sleep apnea. he does not have a history of rheumatic fever. he does not have a history of alcohol use.   he has a BMI of Body mass index is 24.21 kg/m.Marland Kitchen Filed Weights   06/03/21 0849  Weight: 78.7 kg    Family History  Problem Relation Age of Onset   Arthritis Mother    Heart disease Mother        s/p CABG   Hypertension Mother    Alzheimer's disease Mother    Heart attack Mother    Arthritis Father    Hyperlipidemia Father    Heart disease Father        s/p CABG   Stroke Father    Hypertension Father    Heart attack Father    Multiple sclerosis Sister    Diabetes Sister    Lung cancer Paternal Grandmother    Diabetes Sister    Heart attack Sister    Pulmonary embolism Sister        died   Lung cancer Maternal Aunt    Lung cancer Paternal Aunt    Depression Brother        suicide     Atrial Fibrillation Management  history:  Previous antiarrhythmic drugs: none Previous cardioversions: none Previous ablations: none CHADS2VASC score: 4 Anticoagulation history: Eliquis   Past Medical History:  Diagnosis Date   Anxiety and depression    Arthritis    knees, c-spine // gets lumbar ESI q 6 mos   Bipolar disorder ([place])    CAD (coronary artery disease)    [place] 08/2009 >> LHC ([facility]) - mLAD 70, Dx 38 >> PCI:  3x15 mm Endeavor DES to mLAD and 2.5x12 mm Endeavor DES to Dx // S/p NSTEMI >> LHC 8/12 ([facility]) - LM ok, LAD prox and mid 40; LAD stent ok, Dx stent ok, mRCA 30, mLCx 50 >> med Rx // ETT-Echo 2/17 ([facility]): Normal, EF 55-60 at rest   Chicken pox    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[place])    Grief    History of echocardiogram    Echo 3/19: Mild concentric LVH, EF 60-65, normal wall motion, grade 1 diastolic dysfunction, mild AI, mildly dilated aortic root (40 mm), MAC, mild LAE, atrial septal lipomatous hypertrophy // Echo 8/22: EF 55-60, no RWMA, mild LVH, GRII DD, normal RVSF, RVSP 40, mild LAE, trivial MR, mild-moderate AI, AV sclerosis without stenosis, mild dilation of aortic root (39 mm)   History of non-ST elevation myocardial infarction (NSTEMI)    History of nuclear  stress test    Nuclear stress test 3/19: EF 57, inferior/inferoseptal/inferolateral defect consistent with probable soft tissue attenuation (cannot exclude subendocardial scar), no ischemia, intermediate risk >> Echo 3/19 normal EF, normal wall motion   History of pneumonia 2011   History of prostatitis 1990s   Hx of adenomatous colonic polyps 06/22/2016   Hyperlipidemia    Hypertension    Migraines    prior history   PTSD (post-traumatic stress disorder)    Past Surgical History:  Procedure Laterality Date   CERVICAL DISCECTOMY     COLONOSCOPY  03/2017   ELBOW SURGERY Left    ELBOW SURGERY Right    INGUINAL HERNIA REPAIR Bilateral    1996, 1997   KNEE ARTHROSCOPY Right    KNEE CARTILAGE SURGERY Left    NASAL SEPTUM  SURGERY     STENT PLACEMENT VASCULAR ([place] [facility])  09/02/2010   TONSILLECTOMY     TOTAL KNEE ARTHROPLASTY Left 08/31/2018   Procedure: TOTAL KNEE ARTHROPLASTY;  Surgeon: Dorna Leitz, MD;  Location: [facility];  Service: Orthopedics;  Laterality: Left;    Current Outpatient Medications  Medication Sig Dispense Refill   ALPRAZolam (XANAX) 1 MG tablet TAKE 1/2 TO 1 TABLET BY MOUTH ONCE DAILY 30 tablet 2   apixaban (ELIQUIS) 5 MG TABS tablet Take 1 tablet (5 mg total) by mouth 2 (two) times daily. 180 tablet 3   ARIPiprazole (ABILIFY) 15 MG tablet Take 1 tablet (15 mg total) by mouth daily. 30 tablet 2   Cholecalciferol (THERA-D 2000) 50 MCG (2000 UT) TABS 1 tab by mouth once daily 30 tablet 99   diltiazem (CARDIZEM CD) 180 MG 24 hr capsule TAKE 1 CAPSULE BY MOUTH DAILY 30 capsule 11   isosorbide mononitrate (IMDUR) 30 MG 24 hr tablet Take 1 tablet (30 mg total) by mouth daily. 30 tablet 11   lamoTRIgine (LAMICTAL) 200 MG tablet Take 1 tablet (200 mg total) by mouth daily. For mood control 30 tablet 2   nitroGLYCERIN (NITROSTAT) 0.4 MG SL tablet Place 1 tablet (0.4 mg total) under the tongue every 5 (five) minutes as needed for chest pain. 25 tablet 3   rosuvastatin (CRESTOR) 20 MG tablet One tablet by mouth ( 20 mg) daily. 30 tablet 11   No current facility-administered medications for this encounter.    Allergies  Allergen Reactions   Ambien [Zolpidem Tartrate] Other (See Comments)    Blackout, memory issues   Ativan [Lorazepam] Other (See Comments)    Made him feel completely out of it fell down   Seroquel [Quetiapine]     SEVERE NIGHTMARES, SLEEPWALK AND NIGHT DRIVE WITH NO RECOLLECTION UPON WAKENING    Social History   Socioeconomic History   Marital status: Widowed    Spouse name: Not on file   Number of children: 2   Years of education: 52   Highest education level: Not on file  Occupational History   Occupation: Retired  Tobacco Use   Smoking status: Never   Smokeless  tobacco: Never  Vaping Use   Vaping Use: Never used  Substance and Sexual Activity   Alcohol use: Yes    Alcohol/week: 6.0 standard drinks    Types: 6 Cans of beer per week    Comment: 3 beers in a sitting   Drug use: No   Sexual activity: Not Currently  Other Topics Concern   Not on file  Social History Narrative   Retired, widowed in 2017    1 son / 1 daughter  2 caffeinated beverages daily no alcohol or tobacco   Fun: Work out in the yard.   Denies religious beliefs effecting healthcare.    Worked at [employer] for 30 years   Social Determinants of Radio broadcast assistant Strain: Not on [employer] Insecurity: Not on file  Transportation Needs: Not on file  Physical Activity: Not on file  Stress: Not on file  Social Connections: Not on file  Intimate Partner Violence: Not on file     ROS- All systems are reviewed and negative except as per the HPI above.  Physical Exam: Vitals:   06/03/21 0849  BP: (!) 150/90  Pulse: 92  Weight: 78.7 kg  Height: _0  (1.803 m)    GEN- The patient is a well appearing elderly male, alert and oriented x 3 today.   Head- normocephalic, atraumatic Eyes-  Sclera clear, conjunctiva pink Ears- hearing intact Oropharynx- clear Neck- supple  Lungs- Clear to ausculation bilaterally, normal work of breathing Heart- irregular rate and rhythm, no murmurs, rubs or gallops  GI- soft, NT, ND, + BS Extremities- no clubbing, cyanosis, or edema MS- no significant deformity or atrophy Skin- no rash or lesion Psych- euthymic mood, full affect Neuro- strength and sensation are intact  Wt Readings from Last 3 Encounters:  06/03/21 78.7 kg  05/06/21 75.8 kg  03/29/21 75.3 kg    EKG today demonstrates  Afib Vent. rate 92 BPM PR interval * ms QRS duration 96 ms QT/QTcB 352/435 ms  Echo 03/23/21 demonstrated   1. Left ventricular ejection fraction, by estimation, is 55 to 60%. The left ventricle has normal function. The left  ventricle has no regional wall motion abnormalities. There is mild left ventricular hypertrophy. Left ventricular diastolic parameters are consistent with Grade II diastolic dysfunction (pseudonormalization).   2. Right ventricular systolic function is normal. The right ventricular  size is mildly enlarged. There is mildly elevated pulmonary artery  systolic pressure. The estimated right ventricular systolic pressure is 32.9 mmHg.   3. Left atrial size was mildly dilated.   4. The mitral valve is normal in structure. Trivial mitral valve  regurgitation. No evidence of mitral stenosis.   5. The aortic valve is tricuspid. Aortic valve regurgitation is mild to  moderate. Mild aortic valve sclerosis is present, with no evidence of aortic valve stenosis.   6. Aortic dilatation noted. There is mild dilatation of the aortic root,  measuring 39 mm.   7. The inferior vena cava is dilated in size with <50% respiratory  variability, suggesting right atrial pressure of 15 mmHg.   Epic records are reviewed at length today  CHA2DS2-VASc Score = 4  The patient's score is based upon: CHF History: 0 HTN History: 1 Diabetes History: 0 Stroke History: 0 Vascular Disease History: 1 Age Score: 2 Gender Score: 0       ASSESSMENT AND PLAN: 1. Persistent Atrial Fibrillation (ICD10:  I48.19) The patient's CHA2DS2-VASc score is 4, indicating a 4.8% annual risk of stroke.   We discussed rhythm control options today. Patient is already holding his anticoagulation for his ankle surgery on 10/18. Will plan for DCCV post surgery after 3 weeks of uninterrupted anticoagulation. Patient in agreement with plan. Continue diltiazem 180 mg daily Continue Eliquis 5 mg BID (currently held)  2. Secondary Hypercoagulable State (ICD10:  D68.69) The patient is at significant risk for stroke/thromboembolism based upon his CHA2DS2-VASc Score of 4.  Continue Apixaban (Eliquis).   3. HTN Mildly elevated today, well  controlled  at previous visits. No change today.   4. CAD No anginal symptoms.   Follow up in the AF clinic in one month.    [facility] [address] [place], [place] [address] [phone] 06/03/2021 9:09 AM

## 2021-06-03 NOTE — Telephone Encounter (Signed)
Patient with diagnosis of A Fib on Eliquis for anticoagulation.    Procedure: RIGHT MEDIAL DISPLACEMENT CALCANEAL OSTEOTOMY, FLEXOR DIGITORUM LONGUS TRANSFER, POSTERIOR TIBIAL TENDON DEBRIDEMENT, SPRING LIGAMENT RECONSTRUCTION, MEDIAL COLUMN PLANTARFLEXION OSTEOTOMY    Date of procedure: 06/08/21   CHA2DS2-VASc Score = 4  This indicates a 4.8% annual risk of stroke. The patient's score is based upon: CHF History: 0 HTN History: 1 Diabetes History: 0 Stroke History: 0 Vascular Disease History: 1 Age Score: 2 Gender Score: 0   CrCl 84 mL.min Platelet count 225K  Per office protocol, patient can hold Eliquis for 3 days prior to procedure.    Patient will not need bridging with Lovenox (enoxaparin) around procedure.  If surgeon requires more than a 3 day hold, will need authorization from Dr. Gasper Sells

## 2021-06-03 NOTE — Telephone Encounter (Signed)
   Primary Cardiologist: Sherren Mocha, MD  Chart reviewed as part of pre-operative protocol coverage. Given past medical history and time since last visit, based on ACC/AHA guidelines, ETHERIDGE GEIL would be at acceptable risk for the planned procedure without further cardiovascular testing.   Patient with diagnosis of A Fib on Eliquis for anticoagulation.     Procedure: RIGHT MEDIAL DISPLACEMENT CALCANEAL OSTEOTOMY, FLEXOR DIGITORUM LONGUS TRANSFER, POSTERIOR TIBIAL TENDON DEBRIDEMENT, SPRING LIGAMENT RECONSTRUCTION, MEDIAL COLUMN PLANTARFLEXION OSTEOTOMY    Date of procedure: 06/08/21     CHA2DS2-VASc Score = 4  This indicates a 4.8% annual risk of stroke. The patient's score is based upon: CHF History: 0 HTN History: 1 Diabetes History: 0 Stroke History: 0 Vascular Disease History: 1 Age Score: 2 Gender Score: 0    CrCl 84 mL.min Platelet count 225K   Per office protocol, patient can hold Eliquis for 3 days prior to procedure.     Patient will not need bridging with Lovenox (enoxaparin) around procedure.  If surgeon requires more than a 3 day hold, will need authorization from Dr. Gasper Sells  I will route this recommendation to the requesting party via [facility] fax function and remove from pre-op pool.  Please call with questions.  Jossie Ng. Britny Riel NP-C    06/03/2021, 8:27 AM [address] Office [phone] Fax [phone]

## 2021-06-16 ENCOUNTER — Other Ambulatory Visit: Payer: Self-pay

## 2021-06-16 ENCOUNTER — Ambulatory Visit (HOSPITAL_COMMUNITY): Payer: Medicare Other | Admitting: Licensed Clinical Social Worker

## 2021-06-16 ENCOUNTER — Telehealth: Payer: Self-pay | Admitting: Internal Medicine

## 2021-06-16 DIAGNOSIS — E559 Vitamin D deficiency, unspecified: Secondary | ICD-10-CM

## 2021-06-16 DIAGNOSIS — R739 Hyperglycemia, unspecified: Secondary | ICD-10-CM

## 2021-06-16 DIAGNOSIS — E7800 Pure hypercholesterolemia, unspecified: Secondary | ICD-10-CM

## 2021-06-16 NOTE — Telephone Encounter (Signed)
Ok done

## 2021-06-16 NOTE — Telephone Encounter (Signed)
Six month appt with PCP 11/7 Lab appt 11/4  Please add lab orders.

## 2021-06-17 ENCOUNTER — Other Ambulatory Visit: Payer: Self-pay | Admitting: Physician Assistant

## 2021-06-22 ENCOUNTER — Other Ambulatory Visit: Payer: Self-pay | Admitting: Internal Medicine

## 2021-06-25 ENCOUNTER — Other Ambulatory Visit: Payer: Medicare Other

## 2021-06-28 ENCOUNTER — Ambulatory Visit: Payer: Medicare Other

## 2021-06-28 ENCOUNTER — Ambulatory Visit: Payer: Medicare Other | Admitting: Internal Medicine

## 2021-07-01 ENCOUNTER — Other Ambulatory Visit: Payer: Self-pay

## 2021-07-01 ENCOUNTER — Encounter (HOSPITAL_COMMUNITY): Payer: Self-pay | Admitting: Licensed Clinical Social Worker

## 2021-07-01 ENCOUNTER — Ambulatory Visit (INDEPENDENT_AMBULATORY_CARE_PROVIDER_SITE_OTHER): Payer: Medicare Other | Admitting: Licensed Clinical Social Worker

## 2021-07-01 DIAGNOSIS — F3162 Bipolar disorder, current episode mixed, moderate: Secondary | ICD-10-CM | POA: Diagnosis not present

## 2021-07-01 NOTE — Progress Notes (Signed)
Virtual Visit via Telephone Note  I connected with Justin [name] on 07/01/21 at  2:30 PM EST by telephone and verified that I am speaking with the correct person using two identifiers.  Location: Patient: home Provider: home office   I discussed the limitations, risks, security and privacy concerns of performing an evaluation and management service by telephone and the availability of in person appointments. I also discussed with the patient that there may be a patient responsible charge related to this service. The patient expressed understanding and agreed to proceed.  Type of Therapy: Individual Therapy   Treatment Goals addressed: "I'm really worried about my sleep issues and how they are effecting my memory and my mind, having more hallucinations, feeling out of it". Justin [name] will improve psychological functioning as evidenced by reduced experience of psychoses, improvement in sleep, and more stabilized mood 3 out of 7 days.   Interventions: Motivational Interviewing   Summary: Justin [name] "Justin [name]" [name] is a 75 y.o. male who presents with Bipolar 1 Disorder, Mixed, Moderate   Suicidal/Homicidal: No - without intent/plan   Therapist Response: Emil met with clinician for an individual session. Justin [name] discussed his psychiatric symptoms, his current life events and his homework. Justin [name] shared he has been doing much better this week and shared that he has really been impressed by his son. Justin [name] identified that he has been very down on his son, but since the ankle surgery, his son has been calling daily, visiting most days, helping with the dog, and going grocery shopping. Clinician utilized MI OARS to reflect and summarize thoughts and feelings about this change in son's attitude and closeness with Justin [name]. Justin [name] reported that son has also been confiding with him much more, about his family, his relationship with wife, and feelings about the in-laws. Clinician reflected hope that this relationship will  continue to be positive and joy in the past couple of weeks of spending time with son. Clinician explored mood, sleep, and appetite. Justin [name] shared that for the most part, things have been stable.     Plan: Return again in 2-3 weeks.    Diagnosis: Axis I: Bipolar 1 Disorder, Mixed, Moderate      I discussed the assessment and treatment plan with the patient. The patient was provided an opportunity to ask questions and all were answered. The patient agreed with the plan and demonstrated an understanding of the instructions.   The patient was advised to call back or seek an in-person evaluation if the symptoms worsen or if the condition fails to improve as anticipated.  I provided 45 minutes of non-face-to-face time during this encounter.   [name], LCSW

## 2021-07-06 ENCOUNTER — Ambulatory Visit (HOSPITAL_COMMUNITY): Payer: Medicare Other | Admitting: Physician Assistant

## 2021-07-13 ENCOUNTER — Ambulatory Visit (INDEPENDENT_AMBULATORY_CARE_PROVIDER_SITE_OTHER): Payer: Medicare Other | Admitting: Licensed Clinical Social Worker

## 2021-07-13 ENCOUNTER — Other Ambulatory Visit: Payer: Self-pay

## 2021-07-13 DIAGNOSIS — F3162 Bipolar disorder, current episode mixed, moderate: Secondary | ICD-10-CM | POA: Diagnosis not present

## 2021-07-14 ENCOUNTER — Encounter (HOSPITAL_COMMUNITY): Payer: Self-pay | Admitting: Licensed Clinical Social Worker

## 2021-07-14 NOTE — Progress Notes (Signed)
Virtual Visit via Telephone Note  I connected with Justin [name] on 07/14/21 at 12:30 PM EST by telephone and verified that I am speaking with the correct person using two identifiers.  Location: Patient: office Provider: home   I discussed the limitations, risks, security and privacy concerns of performing an evaluation and management service by telephone and the availability of in person appointments. I also discussed with the patient that there may be a patient responsible charge related to this service. The patient expressed understanding and agreed to proceed.   Type of Therapy: Individual Therapy   Treatment Goals addressed: "I'm really worried about my sleep issues and how they are effecting my memory and my mind, having more hallucinations, feeling out of it". Justin [name] will improve psychological functioning as evidenced by reduced experience of psychoses, improvement in sleep, and more stabilized mood 3 out of 7 days.   Interventions: Motivational Interviewing   Summary: Justin [name] "Justin [name]" Bontempo is a 75 y.o. male who presents with Bipolar 1 Disorder, Mixed, Moderate   Suicidal/Homicidal: No - without intent/plan   Therapist Response: Miklo met with clinician for an individual session. Justin [name] discussed his psychiatric symptoms, his current life events and his homework. Justin [name] shared he has been up and down this week due to increased pain and swelling in his foot. Clinician explored interactions and feelings about son. Justin [name] identified a lot of instability in their relationship, which has been very challenging for Justin [name]. Clinician utilized MI OARS to reflect and summarize thoughts and feelings about son's behaviors toward him. Clinician also noted that the likelihood of son having mental illness, such as Bipolar Disorder, may be impacting his ability to have stable mood. Clinician explored the impact of son's abruptness or rude/dismissive attitude on Justin [name]. Clinician discussed ways to build up stronger  boundaries in order to reduce the pain and hurt caused by son's behavior.    Plan: Return again in 2-3 weeks.    Diagnosis: Axis I: Bipolar 1 Disorder, Mixed, Moderate    I discussed the assessment and treatment plan with the patient. The patient was provided an opportunity to ask questions and all were answered. The patient agreed with the plan and demonstrated an understanding of the instructions.   The patient was advised to call back or seek an in-person evaluation if the symptoms worsen or if the condition fails to improve as anticipated.  I provided 45 minutes of non-face-to-face time during this encounter.   [name], LCSW

## 2021-07-20 ENCOUNTER — Ambulatory Visit (HOSPITAL_COMMUNITY): Payer: Medicare Other | Admitting: Physician Assistant

## 2021-07-21 ENCOUNTER — Encounter (HOSPITAL_COMMUNITY): Payer: Self-pay | Admitting: Physician Assistant

## 2021-07-21 ENCOUNTER — Ambulatory Visit (HOSPITAL_COMMUNITY)
Admission: RE | Admit: 2021-07-21 | Discharge: 2021-07-21 | Disposition: A | Discharge status: Home / Self Care | Payer: Medicare Other | Source: Ambulatory Visit | Attending: Physician Assistant | Admitting: Physician Assistant

## 2021-07-21 ENCOUNTER — Other Ambulatory Visit: Payer: Self-pay

## 2021-07-21 VITALS — BP 118/82 | HR 84 | Ht 71.0 in | Wt 164.0 lb

## 2021-07-21 DIAGNOSIS — E785 Hyperlipidemia, unspecified: Secondary | ICD-10-CM | POA: Insufficient documentation

## 2021-07-21 DIAGNOSIS — I2510 Atherosclerotic heart disease of native coronary artery without angina pectoris: Secondary | ICD-10-CM | POA: Diagnosis not present

## 2021-07-21 DIAGNOSIS — Z79899 Other long term (current) drug therapy: Secondary | ICD-10-CM | POA: Insufficient documentation

## 2021-07-21 DIAGNOSIS — D6869 Other thrombophilia: Secondary | ICD-10-CM

## 2021-07-21 DIAGNOSIS — I10 Essential (primary) hypertension: Secondary | ICD-10-CM | POA: Diagnosis not present

## 2021-07-21 DIAGNOSIS — Z7901 Long term (current) use of anticoagulants: Secondary | ICD-10-CM | POA: Diagnosis not present

## 2021-07-21 DIAGNOSIS — I4819 Other persistent atrial fibrillation: Secondary | ICD-10-CM | POA: Diagnosis not present

## 2021-07-21 DIAGNOSIS — R0602 Shortness of breath: Secondary | ICD-10-CM | POA: Insufficient documentation

## 2021-07-21 LAB — CBC
HCT: 41.2 % (ref 39.0–52.0)
Hemoglobin: 13.1 g/dL (ref 13.0–17.0)
MCH: 32 pg (ref 26.0–34.0)
MCHC: 31.8 g/dL (ref 30.0–36.0)
MCV: 100.5 fL — ABNORMAL HIGH (ref 80.0–100.0)
Platelets: 228 10*3/uL (ref 150–400)
RBC: 4.1 MIL/uL — ABNORMAL LOW (ref 4.22–5.81)
RDW: 11.9 % (ref 11.5–15.5)
WBC: 5.1 10*3/uL (ref 4.0–10.5)
nRBC: 0 % (ref 0.0–0.2)

## 2021-07-21 LAB — BASIC METABOLIC PANEL
Anion gap: 8 (ref 5–15)
BUN: 14 mg/dL (ref 8–23)
CO2: 24 mmol/L (ref 22–32)
Calcium: 9.2 mg/dL (ref 8.9–10.3)
Chloride: 100 mmol/L (ref 98–111)
Creatinine, Ser: 1.03 mg/dL (ref 0.61–1.24)
GFR, Estimated: >60 mL/min (ref >60)
Glucose, Bld: 100 mg/dL — ABNORMAL HIGH (ref 70–99)
Potassium: 3.8 mmol/L (ref 3.5–5.1)
Sodium: 132 mmol/L — ABNORMAL LOW (ref 135–145)

## 2021-07-21 NOTE — H&P (View-Only) (Signed)
Primary Care Physician: Biagio Borg, MD Primary Cardiologist: Dr Gasper Sells Primary Electrophysiologist: none Referring Physician: Dr Ollen Gross is a 75 y.o. male with a history of CAD, HTN, HLD, atrial fibrillation who presents for follow up in the [facility].  The patient was initially diagnosed with atrial fibrillation 05/06/21 at Dr Oralia Rud office. He wore a cardiac monitor which showed 100% rate controlled afib. He does feel palpitations and "swimmy headed" while in afib. He also has more SOB and fatigue. Patient is on Eliquis for a CHADS2VASC score of 4. He is scheduled for ankle surgery 06/08/21 and is holding his Eliquis.   On follow up today, patient underwent ankle surgery on 06/08/21 and he is recovering well per his report. He remains in rate controlled afib with palpitations and intermittent dizziness. He has not missed any doses of anticoagulation since 06/10/21.   Today, he denies symptoms of chest pain, orthopnea, PND, lower extremity edema, dizziness, presyncope, syncope, snoring, daytime somnolence, bleeding, or neurologic sequela. The patient is tolerating medications without difficulties and is otherwise without complaint today.    Atrial Fibrillation Risk Factors:  he does not have symptoms or diagnosis of sleep apnea. he does not have a history of rheumatic fever. he does not have a history of alcohol use.   he has a BMI of Body mass index is 22.87 kg/m.Marland Kitchen Filed Weights   07/21/21 1400  Weight: 74.4 kg     Family History  Problem Relation Age of Onset   Arthritis Mother    Heart disease Mother        s/p CABG   Hypertension Mother    Alzheimer's disease Mother    Heart attack Mother    Arthritis Father    Hyperlipidemia Father    Heart disease Father        s/p CABG   Stroke Father    Hypertension Father    Heart attack Father    Multiple sclerosis Sister    Diabetes Sister    Lung  cancer Paternal Grandmother    Diabetes Sister    Heart attack Sister    Pulmonary embolism Sister        died   Lung cancer Maternal Aunt    Lung cancer Paternal Aunt    Depression Brother        suicide     Atrial Fibrillation Management history:  Previous antiarrhythmic drugs: none Previous cardioversions: none Previous ablations: none CHADS2VASC score: 4 Anticoagulation history: Eliquis   Past Medical History:  Diagnosis Date   Anxiety and depression    Arthritis    knees, c-spine // gets lumbar ESI q 6 mos   Bipolar disorder ([place])    CAD (coronary artery disease)    [place] 08/2009 >> [facility] ([facility]) - mLAD 70, Dx 21 >> PCI:  3x15 mm Endeavor DES to mLAD and 2.5x12 mm Endeavor DES to Dx // S/p NSTEMI >> [facility] 8/12 ([facility]) - LM ok, LAD prox and mid 40; LAD stent ok, Dx stent ok, mRCA 30, mLCx 50 >> med Rx // ETT-Echo 2/17 ([facility]): Normal, EF 55-60 at rest   Chicken pox    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[place])    Grief    History of echocardiogram    Echo 3/19: Mild concentric LVH, EF 60-65, normal wall motion, grade 1 diastolic dysfunction, mild AI, mildly dilated aortic root (40 mm), MAC, mild LAE, atrial septal lipomatous hypertrophy // Echo 8/22: EF  55-60, no RWMA, mild LVH, GRII DD, normal RVSF, RVSP 40, mild LAE, trivial MR, mild-moderate AI, AV sclerosis without stenosis, mild dilation of aortic root (39 mm)   History of non-ST elevation myocardial infarction (NSTEMI)    History of nuclear stress test    Nuclear stress test 3/19: EF 57, inferior/inferoseptal/inferolateral defect consistent with probable soft tissue attenuation (cannot exclude subendocardial scar), no ischemia, intermediate risk >> Echo 3/19 normal EF, normal wall motion   History of pneumonia 2011   History of prostatitis 1990s   Hx of adenomatous colonic polyps 06/22/2016   Hyperlipidemia    Hypertension    Migraines    prior history   PTSD (post-traumatic stress disorder)    Past Surgical  History:  Procedure Laterality Date   CERVICAL DISCECTOMY     COLONOSCOPY  03/2017   ELBOW SURGERY Left    ELBOW SURGERY Right    INGUINAL HERNIA REPAIR Bilateral    1996, 1997   KNEE ARTHROSCOPY Right    KNEE CARTILAGE SURGERY Left    NASAL SEPTUM SURGERY     STENT PLACEMENT VASCULAR ([facility])  09/02/2010   TONSILLECTOMY     TOTAL KNEE ARTHROPLASTY Left 08/31/2018   Procedure: TOTAL KNEE ARTHROPLASTY;  Surgeon: Dorna Leitz, MD;  Location: [facility];  Service: Orthopedics;  Laterality: Left;    Current Outpatient Medications  Medication Sig Dispense Refill   ALPRAZolam (XANAX) 1 MG tablet TAKE 1/2 TO 1 TABLET BY MOUTH ONCE DAILY 30 tablet 2   apixaban (ELIQUIS) 5 MG TABS tablet Take 1 tablet (5 mg total) by mouth 2 (two) times daily. 180 tablet 3   Cholecalciferol (THERA-D 2000) 50 MCG (2000 UT) TABS 1 tab by mouth once daily 30 tablet 99   diltiazem (CARDIZEM CD) 180 MG 24 hr capsule TAKE 1 CAPSULE BY MOUTH DAILY 30 capsule 11   lamoTRIgine (LAMICTAL) 200 MG tablet Take 1 tablet (200 mg total) by mouth daily. For mood control 30 tablet 2   nitroGLYCERIN (NITROSTAT) 0.4 MG SL tablet Place 1 tablet (0.4 mg total) under the tongue every 5 (five) minutes as needed for chest pain. 25 tablet 3   rosuvastatin (CRESTOR) 20 MG tablet One tablet by mouth ( 20 mg) daily. 30 tablet 11   isosorbide mononitrate (IMDUR) 30 MG 24 hr tablet Take 1 tablet (30 mg total) by mouth daily. 30 tablet 11   No current facility-administered medications for this encounter.    Allergies  Allergen Reactions   Ambien [Zolpidem Tartrate] Other (See Comments)    Blackout, memory issues   Ativan [Lorazepam] Other (See Comments)    Made him feel completely out of it fell down   Seroquel [Quetiapine]     SEVERE NIGHTMARES, SLEEPWALK AND NIGHT DRIVE WITH NO RECOLLECTION UPON WAKENING    Social History   Socioeconomic History   Marital status: Widowed    Spouse name: Not on file   Number of children: 2    Years of education: 59   Highest education level: Not on file  Occupational History   Occupation: Retired  Tobacco Use   Smoking status: Never   Smokeless tobacco: Never  Vaping Use   Vaping Use: Never used  Substance and Sexual Activity   Alcohol use: Not Currently    Alcohol/week: 6.0 standard drinks    Types: 6 Cans of beer per week    Comment: 3 beers in a sitting   Drug use: No   Sexual activity: Not Currently  Other Topics  Concern   Not on file  Social History Narrative   Retired, widowed in 2017    1 son / 1 daughter   2 caffeinated beverages daily no alcohol or tobacco   Fun: Work out in the yard.   Denies religious beliefs effecting healthcare.    Worked at [employer] for 30 years   Social Determinants of Radio broadcast assistant Strain: Not on [employer] Insecurity: Not on file  Transportation Needs: Not on file  Physical Activity: Not on file  Stress: Not on file  Social Connections: Not on file  Intimate Partner Violence: Not on file     ROS- All systems are reviewed and negative except as per the HPI above.  Physical Exam: Vitals:   07/21/21 1400  BP: 118/82  Pulse: 84  Weight: 74.4 kg  Height: _0  (1.803 m)    GEN- The patient is a well appearing elderly male, alert and oriented x 3 today.   HEENT-head normocephalic, atraumatic, sclera clear, conjunctiva pink, hearing intact, trachea midline. Lungs- Clear to ausculation bilaterally, normal work of breathing Heart- irregular rate and rhythm, no murmurs, rubs or gallops  GI- soft, NT, ND, + BS Extremities- no clubbing, cyanosis, or edema, boot on R leg MS- no significant deformity or atrophy Skin- no rash or lesion Psych- euthymic mood, full affect Neuro- strength and sensation are intact   Wt Readings from Last 3 Encounters:  07/21/21 74.4 kg  06/03/21 78.7 kg  05/06/21 75.8 kg    EKG today demonstrates  Afib Vent. rate 84 BPM PR interval * ms QRS duration 90 ms QT/QTcB 366/432  ms  Echo 03/23/21 demonstrated   1. Left ventricular ejection fraction, by estimation, is 55 to 60%. The left ventricle has normal function. The left ventricle has no regional wall motion abnormalities. There is mild left ventricular hypertrophy. Left ventricular diastolic parameters are consistent with Grade II diastolic dysfunction (pseudonormalization).   2. Right ventricular systolic function is normal. The right ventricular  size is mildly enlarged. There is mildly elevated pulmonary artery  systolic pressure. The estimated right ventricular systolic pressure is 35.0 mmHg.   3. Left atrial size was mildly dilated.   4. The mitral valve is normal in structure. Trivial mitral valve  regurgitation. No evidence of mitral stenosis.   5. The aortic valve is tricuspid. Aortic valve regurgitation is mild to  moderate. Mild aortic valve sclerosis is present, with no evidence of aortic valve stenosis.   6. Aortic dilatation noted. There is mild dilatation of the aortic root,  measuring 39 mm.   7. The inferior vena cava is dilated in size with <50% respiratory  variability, suggesting right atrial pressure of 15 mmHg.   Epic records are reviewed at length today  CHA2DS2-VASc Score = 4  The patient's score is based upon: CHF History: 0 HTN History: 1 Diabetes History: 0 Stroke History: 0 Vascular Disease History: 1 Age Score: 2 Gender Score: 0       ASSESSMENT AND PLAN: 1. Persistent Atrial Fibrillation (ICD10:  I48.19) The patient's CHA2DS2-VASc score is 4, indicating a 4.8% annual risk of stroke.   We discussed rhythm control options today. Will plan for DCCV now that he has been adequately anticoagulated post surgery.  Continue diltiazem 180 mg daily Continue Eliquis 5 mg BID  2. Secondary Hypercoagulable State (ICD10:  D68.69) The patient is at significant risk for stroke/thromboembolism based upon his CHA2DS2-VASc Score of 4.  Continue Apixaban (Eliquis).   3.  HTN Stable, no  changes today.  4. CAD No anginal symptoms.   Follow up in the AF clinic post DCCV.    [facility] [address] [place], [place] [address] [phone] 07/21/2021 2:08 PM

## 2021-07-21 NOTE — Patient Instructions (Signed)
Cardioversion scheduled for Thursday, December 15th  - Arrive at the [facility] and go to admitting at 630AM  - Do not eat or drink anything after midnight the night prior to your procedure.  - Take all your morning medication (except diabetic medications) with a sip of water prior to arrival.  - You will not be able to drive home after your procedure.  - Do NOT miss any doses of your blood thinner - if you should miss a dose please notify our office immediately.  - If you feel as if you go back into normal rhythm prior to scheduled cardioversion, please notify our office immediately. If your procedure is canceled in the cardioversion suite you will be charged a cancellation fee. Patients will be asked to: to mask in public and hand hygiene (no longer quarantine) in the 3 days prior to surgery, to report if any COVID-19-like illness or household contacts to COVID-19 to determine need for testing

## 2021-07-21 NOTE — Progress Notes (Signed)
Primary Care Physician: Biagio Borg, MD Primary Cardiologist: Dr Gasper Sells Primary Electrophysiologist: none Referring Physician: Dr Ollen Gross is a 75 y.o. male with a history of CAD, HTN, HLD, atrial fibrillation who presents for follow up in the [facility].  The patient was initially diagnosed with atrial fibrillation 05/06/21 at Dr Oralia Rud office. He wore a cardiac monitor which showed 100% rate controlled afib. He does feel palpitations and "swimmy headed" while in afib. He also has more SOB and fatigue. Patient is on Eliquis for a CHADS2VASC score of 4. He is scheduled for ankle surgery 06/08/21 and is holding his Eliquis.   On follow up today, patient underwent ankle surgery on 06/08/21 and he is recovering well per his report. He remains in rate controlled afib with palpitations and intermittent dizziness. He has not missed any doses of anticoagulation since 06/10/21.   Today, he denies symptoms of chest pain, orthopnea, PND, lower extremity edema, dizziness, presyncope, syncope, snoring, daytime somnolence, bleeding, or neurologic sequela. The patient is tolerating medications without difficulties and is otherwise without complaint today.    Atrial Fibrillation Risk Factors:  he does not have symptoms or diagnosis of sleep apnea. he does not have a history of rheumatic fever. he does not have a history of alcohol use.   he has a BMI of Body mass index is 22.87 kg/m.Marland Kitchen Filed Weights   07/21/21 1400  Weight: 74.4 kg     Family History  Problem Relation Age of Onset   Arthritis Mother    Heart disease Mother        s/p CABG   Hypertension Mother    Alzheimer's disease Mother    Heart attack Mother    Arthritis Father    Hyperlipidemia Father    Heart disease Father        s/p CABG   Stroke Father    Hypertension Father    Heart attack Father    Multiple sclerosis Sister    Diabetes Sister    Lung  cancer Paternal Grandmother    Diabetes Sister    Heart attack Sister    Pulmonary embolism Sister        died   Lung cancer Maternal Aunt    Lung cancer Paternal Aunt    Depression Brother        suicide     Atrial Fibrillation Management history:  Previous antiarrhythmic drugs: none Previous cardioversions: none Previous ablations: none CHADS2VASC score: 4 Anticoagulation history: Eliquis   Past Medical History:  Diagnosis Date   Anxiety and depression    Arthritis    knees, c-spine // gets lumbar ESI q 6 mos   Bipolar disorder ([place])    CAD (coronary artery disease)    [place] 08/2009 >> [facility] ([facility]) - mLAD 70, Dx 8 >> PCI:  3x15 mm Endeavor DES to mLAD and 2.5x12 mm Endeavor DES to Dx // S/p NSTEMI >> [facility] 8/12 ([facility]) - LM ok, LAD prox and mid 40; LAD stent ok, Dx stent ok, mRCA 30, mLCx 50 >> med Rx // ETT-Echo 2/17 ([facility]): Normal, EF 55-60 at rest   Chicken pox    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[place])    Grief    History of echocardiogram    Echo 3/19: Mild concentric LVH, EF 60-65, normal wall motion, grade 1 diastolic dysfunction, mild AI, mildly dilated aortic root (40 mm), MAC, mild LAE, atrial septal lipomatous hypertrophy // Echo 8/22: EF  55-60, no RWMA, mild LVH, GRII DD, normal RVSF, RVSP 40, mild LAE, trivial MR, mild-moderate AI, AV sclerosis without stenosis, mild dilation of aortic root (39 mm)   History of non-ST elevation myocardial infarction (NSTEMI)    History of nuclear stress test    Nuclear stress test 3/19: EF 57, inferior/inferoseptal/inferolateral defect consistent with probable soft tissue attenuation (cannot exclude subendocardial scar), no ischemia, intermediate risk >> Echo 3/19 normal EF, normal wall motion   History of pneumonia 2011   History of prostatitis 1990s   Hx of adenomatous colonic polyps 06/22/2016   Hyperlipidemia    Hypertension    Migraines    prior history   PTSD (post-traumatic stress disorder)    Past Surgical  History:  Procedure Laterality Date   CERVICAL DISCECTOMY     COLONOSCOPY  03/2017   ELBOW SURGERY Left    ELBOW SURGERY Right    INGUINAL HERNIA REPAIR Bilateral    1996, 1997   KNEE ARTHROSCOPY Right    KNEE CARTILAGE SURGERY Left    NASAL SEPTUM SURGERY     STENT PLACEMENT VASCULAR ([place] [facility])  09/02/2010   TONSILLECTOMY     TOTAL KNEE ARTHROPLASTY Left 08/31/2018   Procedure: TOTAL KNEE ARTHROPLASTY;  Surgeon: Dorna Leitz, MD;  Location: [facility];  Service: Orthopedics;  Laterality: Left;    Current Outpatient Medications  Medication Sig Dispense Refill   ALPRAZolam (XANAX) 1 MG tablet TAKE 1/2 TO 1 TABLET BY MOUTH ONCE DAILY 30 tablet 2   apixaban (ELIQUIS) 5 MG TABS tablet Take 1 tablet (5 mg total) by mouth 2 (two) times daily. 180 tablet 3   Cholecalciferol (THERA-D 2000) 50 MCG (2000 UT) TABS 1 tab by mouth once daily 30 tablet 99   diltiazem (CARDIZEM CD) 180 MG 24 hr capsule TAKE 1 CAPSULE BY MOUTH DAILY 30 capsule 11   lamoTRIgine (LAMICTAL) 200 MG tablet Take 1 tablet (200 mg total) by mouth daily. For mood control 30 tablet 2   nitroGLYCERIN (NITROSTAT) 0.4 MG SL tablet Place 1 tablet (0.4 mg total) under the tongue every 5 (five) minutes as needed for chest pain. 25 tablet 3   rosuvastatin (CRESTOR) 20 MG tablet One tablet by mouth ( 20 mg) daily. 30 tablet 11   isosorbide mononitrate (IMDUR) 30 MG 24 hr tablet Take 1 tablet (30 mg total) by mouth daily. 30 tablet 11   No current facility-administered medications for this encounter.    Allergies  Allergen Reactions   Ambien [Zolpidem Tartrate] Other (See Comments)    Blackout, memory issues   Ativan [Lorazepam] Other (See Comments)    Made him feel completely out of it fell down   Seroquel [Quetiapine]     SEVERE NIGHTMARES, SLEEPWALK AND NIGHT DRIVE WITH NO RECOLLECTION UPON WAKENING    Social History   Socioeconomic History   Marital status: Widowed    Spouse name: Not on file   Number of children: 2    Years of education: 36   Highest education level: Not on file  Occupational History   Occupation: Retired  Tobacco Use   Smoking status: Never   Smokeless tobacco: Never  Vaping Use   Vaping Use: Never used  Substance and Sexual Activity   Alcohol use: Not Currently    Alcohol/week: 6.0 standard drinks    Types: 6 Cans of beer per week    Comment: 3 beers in a sitting   Drug use: No   Sexual activity: Not Currently  Other Topics  Concern  ° Not on file  °Social History Narrative  ° Retired, widowed in 2017   ° 1 son / 1 daughter  ° 2 caffeinated beverages daily no alcohol or tobacco  ° Fun: Work out in the yard.  ° Denies religious beliefs effecting healthcare.   ° Worked at [employer] for 30 years  ° °Social Determinants of Health  ° °Financial Resource Strain: Not on file  °Food Insecurity: Not on file  °Transportation Needs: Not on file  °Physical Activity: Not on file  °Stress: Not on file  °Social Connections: Not on file  °Intimate Partner Violence: Not on file  ° ° ° °ROS- All systems are reviewed and negative except as per the HPI above. ° °Physical Exam: °Vitals:  ° 07/21/21 1400  °BP: 118/82  °Pulse: 84  °Weight: 74.4 kg  °Height: 5' 11" (1.803 m)  ° ° °GEN- The patient is a well appearing elderly male, alert and oriented x 3 today.   °HEENT-head normocephalic, atraumatic, sclera clear, conjunctiva pink, hearing intact, trachea midline. °Lungs- Clear to ausculation bilaterally, normal work of breathing °Heart- irregular rate and rhythm, no murmurs, rubs or gallops  °GI- soft, NT, ND, + BS °Extremities- no clubbing, cyanosis, or edema, boot on R leg °MS- no significant deformity or atrophy °Skin- no rash or lesion °Psych- euthymic mood, full affect °Neuro- strength and sensation are intact ° ° °Wt Readings from Last 3 Encounters:  °07/21/21 74.4 kg  °06/03/21 78.7 kg  °05/06/21 75.8 kg  ° ° °EKG today demonstrates  °Afib °Vent. rate 84 BPM °PR interval * ms °QRS duration 90 ms °QT/QTcB 366/432  ms ° °Echo 03/23/21 demonstrated  ° 1. Left ventricular ejection fraction, by estimation, is 55 to 60%. The left ventricle has normal function. The left ventricle has no regional wall motion abnormalities. There is mild left ventricular hypertrophy. Left ventricular diastolic parameters are consistent with Grade II diastolic dysfunction (pseudonormalization).  ° 2. Right ventricular systolic function is normal. The right ventricular  °size is mildly enlarged. There is mildly elevated pulmonary artery  °systolic pressure. The estimated right ventricular systolic pressure is 40.0 mmHg.  ° 3. Left atrial size was mildly dilated.  ° 4. The mitral valve is normal in structure. Trivial mitral valve  °regurgitation. No evidence of mitral stenosis.  ° 5. The aortic valve is tricuspid. Aortic valve regurgitation is mild to  °moderate. Mild aortic valve sclerosis is present, with no evidence of aortic valve stenosis.  ° 6. Aortic dilatation noted. There is mild dilatation of the aortic root,  °measuring 39 mm.  ° 7. The inferior vena cava is dilated in size with <50% respiratory  °variability, suggesting right atrial pressure of 15 mmHg.  ° °Epic records are reviewed at length today ° °CHA2DS2-VASc Score = 4  °The patient's score is based upon: °CHF History: 0 °HTN History: 1 °Diabetes History: 0 °Stroke History: 0 °Vascular Disease History: 1 °Age Score: 2 °Gender Score: 0 °    ° ° °ASSESSMENT AND PLAN: °1. Persistent Atrial Fibrillation (ICD10:  I48.19) °The patient's CHA2DS2-VASc score is 4, indicating a 4.8% annual risk of stroke.   °We discussed rhythm control options today. Will plan for DCCV now that he has been adequately anticoagulated post surgery.  °Continue diltiazem 180 mg daily °Continue Eliquis 5 mg BID ° °2. Secondary Hypercoagulable State (ICD10:  D68.69) °The patient is at significant risk for stroke/thromboembolism based upon his CHA2DS2-VASc Score of 4.  Continue Apixaban (Eliquis).  ° °3.   HTN Stable, no  changes today.  4. CAD No anginal symptoms.   Follow up in the AF clinic post DCCV.    [facility] [address] [place], [place] [address] [phone] 07/21/2021 2:08 PM

## 2021-07-26 ENCOUNTER — Encounter (HOSPITAL_COMMUNITY): Payer: Self-pay | Admitting: Cardiovascular Disease

## 2021-07-28 ENCOUNTER — Telehealth (HOSPITAL_COMMUNITY): Payer: Medicare Other | Admitting: Psychiatry

## 2021-07-28 ENCOUNTER — Other Ambulatory Visit: Payer: Self-pay

## 2021-07-29 ENCOUNTER — Telehealth (HOSPITAL_BASED_OUTPATIENT_CLINIC_OR_DEPARTMENT_OTHER): Payer: Medicare Other | Admitting: Psychiatry

## 2021-07-29 ENCOUNTER — Encounter (HOSPITAL_COMMUNITY): Payer: Self-pay | Admitting: Psychiatry

## 2021-07-29 VITALS — Wt 164.0 lb

## 2021-07-29 DIAGNOSIS — F3162 Bipolar disorder, current episode mixed, moderate: Secondary | ICD-10-CM | POA: Diagnosis not present

## 2021-07-29 DIAGNOSIS — F4310 Post-traumatic stress disorder, unspecified: Secondary | ICD-10-CM | POA: Diagnosis not present

## 2021-07-29 MED ORDER — LAMOTRIGINE 200 MG PO TABS
200.0000 mg | ORAL_TABLET | Freq: Every day | ORAL | 2 refills | Status: DC
Start: 2021-07-29 — End: 2021-09-10

## 2021-07-29 MED ORDER — LAMOTRIGINE 25 MG PO TABS: ORAL_TABLET | ORAL | 1 refills | Status: DC

## 2021-07-29 NOTE — Progress Notes (Signed)
Virtual Visit via Telephone Note  I connected with Justin [name] on 07/29/21 at  9:20 AM EST by telephone and verified that I am speaking with the correct person using two identifiers.  Location: Patient: Home Provider: Home Office   I discussed the limitations, risks, security and privacy concerns of performing an evaluation and management service by telephone and the availability of in person appointments. I also discussed with the patient that there may be a patient responsible charge related to this service. The patient expressed understanding and agreed to proceed.   History of Present Illness: Patient is evaluated by phone session.  He endorsed increased anxiety, irritability depression and not sleeping well.  He is frustrated on his right ankle not getting better.  He had a procedure on October 1st and still struggling with pain, swelling and using crutches.  He is not able to drive.  He is dependent on his son to take him to the doctor's appointment.  His physical therapy has not started because insurance has not approved.  Patient told he was told that 6 weeks after the procedure he will be fine and start walking.  Now it is more than 9 weeks and he has not seen any improvement.  Does not like asking help from his son.  He is in therapy with Janett Billow.  He also endorsed not taking the Abilify because he could not afford.  He stopped taking Abilify 3 weeks ago.  He is not sleeping good.  His appetite is fair and he lost few pounds since the last visit.  Now he regrets that why he had a surgery of his ankle because it got worse.  He has 3 screws in his ankles.  His Thanksgiving was okay because his sister and his neighbor brought the food but he was by himself.  Denies any hallucination, paranoia, suicidal thoughts but admitted irritability, frustration and feeling overwhelmed due to his health condition.  He also scheduled to have cardioversion next week to address his A. fib.  His physician  started him on oxycodone to help the pain and since then he is not taking Xanax either.  He admitted nightmares and flashbacks.  He has no tremor or shakes or any EPS.  His energy level is low.  He endorsed good support from his neighbors and sometimes his sister comes and helps him take the doctor's appointment.   Past Psychiatric History: Reviewed. H/O suicidal attempt by overdose on Ambien twice, hydroxyzine and alcohol. H/O bipolar disorder with multiple inpatient.  Did IOP. Last ER visit Sept 2020 and inpatient October 2018 at [facility]. Tried Zyprexa, Cymbalta, Seroquel, Vistaril, Neurontin, Wellbutrin and triptal. Tried low-dose doxepin and trazodone. Had a reaction with Ativan and pain medication.  Saw Dr. Letta Moynahan in past.    Psychiatric Specialty Exam: Physical Exam  Review of Systems  Weight 164 lb (74.4 kg).There is no height or weight on file to calculate BMI.  General Appearance: NA  Eye Contact:  NA  Speech:  Slow  Volume:  Decreased  Mood:  Anxious, Depressed, and Irritable  Affect:  NA  Thought Process:  Goal Directed  Orientation:  Full (Time, Place, and Person)  Thought Content:  Rumination  Suicidal Thoughts:  No  Homicidal Thoughts:  No  Memory:  Immediate;   Good Recent;   Good Remote;   Good  Judgement:  Intact  Insight:  Fair  Psychomotor Activity:  NA  Concentration:  Concentration: Fair and Attention Span: Fair  Recall:  Good  Fund of Knowledge:  Good  Language:  Good  Akathisia:  No  Handed:  Right  AIMS (if indicated):     Assets:  Communication Skills Desire for Improvement Housing Social Support  ADL's:  Intact  Cognition:  WNL  Sleep:   fair      Assessment and Plan: Bipolar disorder type I.  PTSD.  Anxiety.  Review current medication and his symptoms.  He is not taking Abilify as he cannot afford and not taking Xanax because he is taking pain medication.  He is taking Lamictal which he feels helps his manic symptoms.  We talk about  considering adding more Lamictal since he cannot afford further medication and he does not want to try a different medication at this time.  I offered to try samples of Abilify or REXULTI but patient cannot drive but willing to try higher dose of Lamictal.  He was taking Abilify 15 mg which was stopped as he could not afford it.  So far he has no rash or itching tremors or shakes.  We talk about hoping to have a physical therapy soon since insurance working on it and then he may able to get some relief in his pain and walking.  He is also hoping cardioversion go very well and he has a sister, neighbor and son available to help him to take to the doctors and hospital appointment.  Reassurance given.  We will try additional Lamictal 25 mg for 1 week and then twice a day with original Lamictal of 200 mg.  I recommend if symptoms started to get worse or anytime having suicidal thoughts or homicidal thought then he need to call us immediately.  I also recommended if he has needs to pick up the samples then we can arrange samples of Abilify which he has not been taking for a while.  Patient agreed to give [place] a call if he need samples.  Follow-up in 4 to 6 weeks.  Follow Up Instructions:    I discussed the assessment and treatment plan with the patient. The patient was provided an opportunity to ask questions and all were answered. The patient agreed with the plan and demonstrated an understanding of the instructions.   The patient was advised to call back or seek an in-person evaluation if the symptoms worsen or if the condition fails to improve as anticipated.  I provided 37 minutes of non-face-to-face time during this encounter.   Kathlee Nations, MD

## 2021-08-04 ENCOUNTER — Telehealth (HOSPITAL_COMMUNITY): Payer: Medicare Other | Admitting: Psychiatry

## 2021-08-05 ENCOUNTER — Encounter (HOSPITAL_COMMUNITY): Payer: Self-pay | Admitting: Cardiovascular Disease

## 2021-08-05 ENCOUNTER — Ambulatory Visit (HOSPITAL_COMMUNITY): Payer: Medicare Other | Admitting: Anesthesiology

## 2021-08-05 ENCOUNTER — Ambulatory Visit (HOSPITAL_COMMUNITY): Payer: Medicare Other | Admitting: Licensed Clinical Social Worker

## 2021-08-05 ENCOUNTER — Other Ambulatory Visit: Payer: Self-pay

## 2021-08-05 ENCOUNTER — Ambulatory Visit (HOSPITAL_COMMUNITY)
Admission: RE | Admit: 2021-08-05 | Discharge: 2021-08-05 | Disposition: A | Discharge status: Home / Self Care | Payer: Medicare Other | Attending: Cardiovascular Disease | Admitting: Cardiovascular Disease

## 2021-08-05 ENCOUNTER — Encounter (HOSPITAL_COMMUNITY)
Admission: RE | Disposition: A | Discharge status: Home / Self Care | Payer: Self-pay | Source: Home / Self Care | Attending: Cardiovascular Disease

## 2021-08-05 DIAGNOSIS — I491 Atrial premature depolarization: Secondary | ICD-10-CM | POA: Insufficient documentation

## 2021-08-05 DIAGNOSIS — I10 Essential (primary) hypertension: Secondary | ICD-10-CM | POA: Diagnosis not present

## 2021-08-05 DIAGNOSIS — I351 Nonrheumatic aortic (valve) insufficiency: Secondary | ICD-10-CM | POA: Diagnosis not present

## 2021-08-05 DIAGNOSIS — I252 Old myocardial infarction: Secondary | ICD-10-CM | POA: Insufficient documentation

## 2021-08-05 DIAGNOSIS — E785 Hyperlipidemia, unspecified: Secondary | ICD-10-CM | POA: Insufficient documentation

## 2021-08-05 DIAGNOSIS — I4819 Other persistent atrial fibrillation: Secondary | ICD-10-CM | POA: Diagnosis not present

## 2021-08-05 DIAGNOSIS — I4891 Unspecified atrial fibrillation: Secondary | ICD-10-CM | POA: Diagnosis present

## 2021-08-05 DIAGNOSIS — I2510 Atherosclerotic heart disease of native coronary artery without angina pectoris: Secondary | ICD-10-CM | POA: Diagnosis not present

## 2021-08-05 DIAGNOSIS — Z7901 Long term (current) use of anticoagulants: Secondary | ICD-10-CM | POA: Diagnosis not present

## 2021-08-05 HISTORY — PX: CARDIOVERSION: SHX1299

## 2021-08-05 SURGERY — CARDIOVERSION
Anesthesia: General

## 2021-08-05 MED ORDER — LIDOCAINE 2% (20 MG/ML) 5 ML SYRINGE
INTRAMUSCULAR | Status: DC | PRN
  Administered 2021-08-05: 60 mg via INTRAVENOUS

## 2021-08-05 MED ORDER — PROPOFOL 10 MG/ML IV BOLUS
INTRAVENOUS | Status: DC | PRN
  Administered 2021-08-05: 20 mg via INTRAVENOUS
  Administered 2021-08-05: 50 mg via INTRAVENOUS
  Administered 2021-08-05: 30 mg via INTRAVENOUS

## 2021-08-05 MED ORDER — SODIUM CHLORIDE 0.9 % IV SOLN: INTRAVENOUS | Status: DC

## 2021-08-05 NOTE — Anesthesia Preprocedure Evaluation (Addendum)
Anesthesia Evaluation  Patient identified by MRN, date of birth, ID band Patient awake    Reviewed: Allergy & Precautions, NPO status , Patient's Chart, lab work & pertinent test results  Airway Mallampati: II  TM Distance: >3 FB Neck ROM: Full    Dental  (+) Poor Dentition, Chipped, Missing, Dental Advisory Given,    Pulmonary neg pulmonary ROS,    Pulmonary exam normal breath sounds clear to auscultation       Cardiovascular hypertension, + angina + CAD, + Past MI and + Cardiac Stents  Normal cardiovascular exam+ dysrhythmias Atrial Fibrillation + Valvular Problems/Murmurs (mild/mod AI) AI  Rhythm:Irregular Rate:Normal  TTE 2022 1. Left ventricular ejection fraction, by estimation, is 55 to 60%. The  left ventricle has normal function. The left ventricle has no regional  wall motion abnormalities. There is mild left ventricular hypertrophy.  Left ventricular diastolic parameters  are consistent with Grade II diastolic dysfunction (pseudonormalization).  2. Right ventricular systolic function is normal. The right ventricular  size is mildly enlarged. There is mildly elevated pulmonary artery  systolic pressure. The estimated right ventricular systolic pressure is  02.3 mmHg.  3. Left atrial size was mildly dilated.  4. The mitral valve is normal in structure. Trivial mitral valve  regurgitation. No evidence of mitral stenosis.  5. The aortic valve is tricuspid. Aortic valve regurgitation is mild to  moderate. Mild aortic valve sclerosis is present, with no evidence of  aortic valve stenosis.  6. Aortic dilatation noted. There is mild dilatation of the aortic root,  measuring 39 mm.  7. The inferior vena cava is dilated in size with <50% respiratory  variability, suggesting right atrial pressure of 15 mmHg.  Stress Test 2022 unremarkable   Neuro/Psych  Headaches, PSYCHIATRIC DISORDERS Anxiety Depression Bipolar  Disorder Dementia    GI/Hepatic negative GI ROS, Neg liver ROS,   Endo/Other  negative endocrine ROS  Renal/GU negative Renal ROS  negative genitourinary   Musculoskeletal negative musculoskeletal ROS (+)   Abdominal   Peds  Hematology  (+) Blood dyscrasia (on eliquis), ,   Anesthesia Other Findings   Reproductive/Obstetrics                            Anesthesia Physical Anesthesia Plan  ASA: 3  Anesthesia Plan: General   Post-op Pain Management:    Induction: Intravenous  PONV Risk Score and Plan: 2 and Propofol infusion and Treatment may vary due to age or medical condition  Airway Management Planned: Natural Airway and Mask  Additional Equipment:   Intra-op Plan:   Post-operative Plan:   Informed Consent: I have reviewed the patients History and Physical, chart, labs and discussed the procedure including the risks, benefits and alternatives for the proposed anesthesia with the patient or authorized representative who has indicated his/her understanding and acceptance.     Dental advisory given  Plan Discussed with: CRNA  Anesthesia Plan Comments:         Anesthesia Quick Evaluation

## 2021-08-05 NOTE — Anesthesia Postprocedure Evaluation (Signed)
Anesthesia Post Note  Patient: Justin [name]  Procedure(s) Performed: CARDIOVERSION     Patient location during evaluation: Endoscopy Anesthesia Type: General Level of consciousness: awake and alert Pain management: pain level controlled Vital Signs Assessment: post-procedure vital signs reviewed and stable Respiratory status: spontaneous breathing, nonlabored ventilation, respiratory function stable and patient connected to nasal cannula oxygen Cardiovascular status: blood pressure returned to baseline and stable Postop Assessment: no apparent nausea or vomiting Anesthetic complications: no   No notable events documented.  Last Vitals:  Vitals:   08/05/21 0838 08/05/21 0844  BP: 108/68 123/69  Pulse: (!) 56 (!) 54  Resp: (!) 21 18  Temp:    SpO2: 99% 100%    Last Pain:  Vitals:   08/05/21 0844  TempSrc:   PainSc: 0-No pain                 Ritik Stavola L Etrulia Zarr

## 2021-08-05 NOTE — Interval H&P Note (Signed)
History and Physical Interval Note:  08/05/2021 7:52 AM  Justin [name]  has presented today for surgery, with the diagnosis of AFIB.  The various methods of treatment have been discussed with the patient and family. After consideration of risks, benefits and other options for treatment, the patient has consented to  Procedure(s): CARDIOVERSION (N/A) as a surgical intervention.  The patient's history has been reviewed, patient examined, no change in status, stable for surgery.  I have reviewed the patient's chart and labs.  Questions were answered to the patient's satisfaction.     Mertie Moores

## 2021-08-05 NOTE — CV Procedure (Signed)
° ° °  Cardioversion Note  Justin [name] [id number] 11/12/1945  Procedure: DC Cardioversion Indications: atrial fib   Procedure Details Consent: Obtained Time Out: Verified patient identification, verified procedure, site/side was marked, verified correct patient position, special equipment/implants available, Radiology Safety Procedures followed,  medications/allergies/relevent history reviewed, required imaging and test results available.  Performed  The patient has been on adequate anticoagulation.  The patient received IV Lidocaine 60 mg IV followed by Propofol 100 mg IV  for sedation.  Synchronous cardioversion was performed at 200, 200, 200  joules.  The cardioversion was successful     Complications: No apparent complications Patient did tolerate procedure well.   Thayer Headings, Brooke Bonito., MD, [facility] 08/05/2021, 8:03 AM

## 2021-08-05 NOTE — Discharge Instructions (Signed)

## 2021-08-05 NOTE — Transfer of Care (Signed)
Immediate Anesthesia Transfer of Care Note  Patient: Justin [name]  Procedure(s) Performed: CARDIOVERSION  Patient Location: Endoscopy Unit  Anesthesia Type:General  Level of Consciousness: drowsy and patient cooperative  Airway & Oxygen Therapy: Patient Spontanous Breathing and Patient connected to nasal cannula oxygen  Post-op Assessment: Report given to RN, Post -op Vital signs reviewed and stable and Patient moving all extremities  Post vital signs: Reviewed and stable  Last Vitals:  Vitals Value Taken Time  BP    Temp    Pulse    Resp    SpO2      Last Pain:  Vitals:   08/05/21 0656  TempSrc: Temporal  PainSc: 0-No pain         Complications: No notable events documented.

## 2021-08-05 NOTE — Anesthesia Procedure Notes (Signed)
Procedure Name: MAC Date/Time: 08/05/2021 7:55 AM Performed by: Moshe Salisbury, CRNA Pre-anesthesia Checklist: Patient identified, Emergency Drugs available, Suction available and Patient being monitored Patient Re-evaluated:Patient Re-evaluated prior to induction Oxygen Delivery Method: Nasal cannula Placement Confirmation: positive ETCO2 Dental Injury: Teeth and Oropharynx as per pre-operative assessment

## 2021-08-08 ENCOUNTER — Encounter (HOSPITAL_COMMUNITY): Payer: Self-pay | Admitting: Cardiovascular Disease

## 2021-08-17 NOTE — Progress Notes (Signed)
Primary Care Physician: Justin Borg, MD Primary Cardiologist: Dr Gasper Sells Primary Electrophysiologist: none Referring Physician: Dr Ollen Gross is a 75 y.o. male with a history of CAD, HTN, HLD, atrial fibrillation who presents for follow up in the [facility].  The patient was initially diagnosed with atrial fibrillation 05/06/21 at Dr Oralia Rud office. He wore a cardiac monitor which showed 100% rate controlled afib. He does feel palpitations and "swimmy headed" while in afib. He also has more SOB and fatigue. Patient is on Eliquis for a CHADS2VASC score of 4. Patient underwent ankle surgery on 06/08/21. He resumed anticoagulation 06/10/21.   On follow up today, patient is s/p DCCV on 08/05/21. He is back in rate controlled afib today. He did not notice any difference in symptoms in SR. He denies any bleeding issues on anticoagulation.   Today, he denies symptoms of chest pain, orthopnea, PND, lower extremity edema, dizziness, presyncope, syncope, snoring, daytime somnolence, bleeding, or neurologic sequela. The patient is tolerating medications without difficulties and is otherwise without complaint today.    Atrial Fibrillation Risk Factors:  he does not have symptoms or diagnosis of sleep apnea. he does not have a history of rheumatic fever. he does not have a history of alcohol use.   he has a BMI of Body mass index is 23.21 kg/m.Marland Kitchen Filed Weights   08/18/21 0838  Weight: 75.5 kg      Family History  Problem Relation Age of Onset   Arthritis Mother    Heart disease Mother        s/p CABG   Hypertension Mother    Alzheimer's disease Mother    Heart attack Mother    Arthritis Father    Hyperlipidemia Father    Heart disease Father        s/p CABG   Stroke Father    Hypertension Father    Heart attack Father    Multiple sclerosis Sister    Diabetes Sister    Lung cancer Paternal Grandmother    Diabetes  Sister    Heart attack Sister    Pulmonary embolism Sister        died   Lung cancer Maternal Aunt    Lung cancer Paternal Aunt    Depression Brother        suicide     Atrial Fibrillation Management history:  Previous antiarrhythmic drugs: none Previous cardioversions: 08/05/21 Previous ablations: none CHADS2VASC score: 4 Anticoagulation history: Eliquis   Past Medical History:  Diagnosis Date   Anxiety and depression    Arthritis    knees, c-spine // gets lumbar ESI q 6 mos   Bipolar disorder ([place])    CAD (coronary artery disease)    [place] 08/2009 >> LHC ([facility]) - mLAD 70, Dx 65 >> PCI:  3x15 mm Endeavor DES to mLAD and 2.5x12 mm Endeavor DES to Dx // S/p NSTEMI >> LHC 8/12 ([facility]) - LM ok, LAD prox and mid 40; LAD stent ok, Dx stent ok, mRCA 30, mLCx 50 >> med Rx // ETT-Echo 2/17 ([facility]): Normal, EF 55-60 at rest   Chicken pox    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[place])    Grief    History of echocardiogram    Echo 3/19: Mild concentric LVH, EF 60-65, normal wall motion, grade 1 diastolic dysfunction, mild AI, mildly dilated aortic root (40 mm), MAC, mild LAE, atrial septal lipomatous hypertrophy // Echo 8/22: EF 55-60, no RWMA, mild LVH,  GRII DD, normal RVSF, RVSP 40, mild LAE, trivial MR, mild-moderate AI, AV sclerosis without stenosis, mild dilation of aortic root (39 mm)   History of non-ST elevation myocardial infarction (NSTEMI)    History of nuclear stress test    Nuclear stress test 3/19: EF 57, inferior/inferoseptal/inferolateral defect consistent with probable soft tissue attenuation (cannot exclude subendocardial scar), no ischemia, intermediate risk >> Echo 3/19 normal EF, normal wall motion   History of pneumonia 2011   History of prostatitis 1990s   Hx of adenomatous colonic polyps 06/22/2016   Hyperlipidemia    Hypertension    Migraines    prior history   PTSD (post-traumatic stress disorder)    Past Surgical History:  Procedure Laterality Date    CARDIOVERSION N/A 08/05/2021   Procedure: CARDIOVERSION;  Surgeon: Acie Fredrickson Wonda Cheng, MD;  Location: [facility] ENDOSCOPY;  Service: Cardiovascular;  Laterality: N/A;   CERVICAL DISCECTOMY     COLONOSCOPY  03/2017   ELBOW SURGERY Left    ELBOW SURGERY Right    INGUINAL HERNIA REPAIR Bilateral    1996, 1997   KNEE ARTHROSCOPY Right    KNEE CARTILAGE SURGERY Left    NASAL SEPTUM SURGERY     STENT PLACEMENT VASCULAR ([facility] HX)  09/02/2010   TONSILLECTOMY     TOTAL KNEE ARTHROPLASTY Left 08/31/2018   Procedure: TOTAL KNEE ARTHROPLASTY;  Surgeon: Dorna Leitz, MD;  Location: [facility] ORS;  Service: Orthopedics;  Laterality: Left;    Current Outpatient Medications  Medication Sig Dispense Refill   ALPRAZolam (XANAX) 1 MG tablet TAKE 1/2 TO 1 TABLET BY MOUTH ONCE DAILY 30 tablet 2   apixaban (ELIQUIS) 5 MG TABS tablet Take 1 tablet (5 mg total) by mouth 2 (two) times daily. 180 tablet 3   diltiazem (CARDIZEM CD) 180 MG 24 hr capsule TAKE 1 CAPSULE BY MOUTH DAILY 30 capsule 11   isosorbide mononitrate (IMDUR) 30 MG 24 hr tablet Take 1 tablet (30 mg total) by mouth daily. 30 tablet 11   lamoTRIgine (LAMICTAL) 200 MG tablet Take 1 tablet (200 mg total) by mouth daily. For mood control 30 tablet 2   lamoTRIgine (LAMICTAL) 25 MG tablet Take one tab daily for one week and than twice a day. Take it with 200 mg Lamictal. 30 tablet 1   meloxicam (MOBIC) 15 MG tablet Take 15 mg by mouth daily.     nitroGLYCERIN (NITROSTAT) 0.4 MG SL tablet Place 1 tablet (0.4 mg total) under the tongue every 5 (five) minutes as needed for chest pain. 25 tablet 3   oxyCODONE (OXY IR/ROXICODONE) 5 MG immediate release tablet Take 5 mg by mouth every 8 (eight) hours as needed (pain).     rosuvastatin (CRESTOR) 20 MG tablet One tablet by mouth ( 20 mg) daily. 30 tablet 11   No current facility-administered medications for this encounter.    Allergies  Allergen Reactions   Ambien [Zolpidem Tartrate] Other (See Comments)    Blackout,  memory issues   Ativan [Lorazepam] Other (See Comments)    Made him feel completely out of it fell down   Seroquel [Quetiapine]     SEVERE NIGHTMARES, SLEEPWALK AND NIGHT DRIVE WITH NO RECOLLECTION UPON WAKENING    Social History   Socioeconomic History   Marital status: Widowed    Spouse name: Not on file   Number of children: 2   Years of education: 66   Highest education level: Not on file  Occupational History   Occupation: Retired  Tobacco Use  Smoking status: Never   Smokeless tobacco: Never  Vaping Use   Vaping Use: Never used  Substance and Sexual Activity   Alcohol use: Not Currently    Alcohol/week: 6.0 standard drinks    Types: 6 Cans of beer per week    Comment: 3 beers in a sitting   Drug use: No   Sexual activity: Not Currently  Other Topics Concern   Not on file  Social History Narrative   Retired, widowed in 2017    1 son / 1 daughter   2 caffeinated beverages daily no alcohol or tobacco   Fun: Work out in the yard.   Denies religious beliefs effecting healthcare.    Worked at [employer] for 30 years   Social Determinants of Radio broadcast assistant Strain: Not on [employer] Insecurity: Not on file  Transportation Needs: Not on file  Physical Activity: Not on file  Stress: Not on file  Social Connections: Not on file  Intimate Partner Violence: Not on file     ROS- All systems are reviewed and negative except as per the HPI above.  Physical Exam: Vitals:   08/18/21 0838  BP: 128/82  Pulse: 76  Weight: 75.5 kg  Height: _0  (1.803 m)    GEN- The patient is a well appearing elderly male, alert and oriented x 3 today.   HEENT-head normocephalic, atraumatic, sclera clear, conjunctiva pink, hearing intact, trachea midline. Lungs- Clear to ausculation bilaterally, normal work of breathing Heart- irregular rate and rhythm, no murmurs, rubs or gallops  GI- soft, NT, ND, + BS Extremities- no clubbing, cyanosis, or edema MS- no  significant deformity or atrophy Skin- no rash or lesion Psych- euthymic mood, full affect Neuro- strength and sensation are intact   Wt Readings from Last 3 Encounters:  08/18/21 75.5 kg  08/05/21 74.4 kg  07/21/21 74.4 kg    EKG today demonstrates  Afib Vent. rate 76 BPM PR interval * ms QRS duration 92 ms QT/QTcB 366/411 ms  Echo 03/23/21 demonstrated   1. Left ventricular ejection fraction, by estimation, is 55 to 60%. The left ventricle has normal function. The left ventricle has no regional wall motion abnormalities. There is mild left ventricular hypertrophy. Left ventricular diastolic parameters are consistent with Grade II diastolic dysfunction (pseudonormalization).   2. Right ventricular systolic function is normal. The right ventricular  size is mildly enlarged. There is mildly elevated pulmonary artery  systolic pressure. The estimated right ventricular systolic pressure is 01.7 mmHg.   3. Left atrial size was mildly dilated.   4. The mitral valve is normal in structure. Trivial mitral valve  regurgitation. No evidence of mitral stenosis.   5. The aortic valve is tricuspid. Aortic valve regurgitation is mild to  moderate. Mild aortic valve sclerosis is present, with no evidence of aortic valve stenosis.   6. Aortic dilatation noted. There is mild dilatation of the aortic root,  measuring 39 mm.   7. The inferior vena cava is dilated in size with <50% respiratory  variability, suggesting right atrial pressure of 15 mmHg.   Epic records are reviewed at length today  CHA2DS2-VASc Score = 4  The patient's score is based upon: CHF History: 0 HTN History: 1 Diabetes History: 0 Stroke History: 0 Vascular Disease History: 1 Age Score: 2 Gender Score: 0      ASSESSMENT AND PLAN: 1. Persistent Atrial Fibrillation (ICD10:  I48.19) The patient's CHA2DS2-VASc score is 4, indicating a 4.8% annual risk of  stroke.   S/p DCCV 08/05/21 with early return of afib. We  discussed rate vs rhythm control. Would avoid class IC with h/o CAD, can not use dofetilide with lamotrigine. We discussed Multaq, amiodarone, and ablation. He is interested in speaking with EP about ablation but may ultimately decide on rate control. He was not interested in repeat DCCV.  Continue diltiazem 180 mg daily Continue Eliquis 5 mg BID  2. Secondary Hypercoagulable State (ICD10:  D68.69) The patient is at significant risk for stroke/thromboembolism based upon his CHA2DS2-VASc Score of 4.  Continue Apixaban (Eliquis).   3. HTN Stable, no changes today.  4. CAD No anginal symptoms.   Follow up with EP to discuss possible ablation.    [facility] [address] [place], [place] [address] [phone] 08/18/2021 8:45 AM

## 2021-08-18 ENCOUNTER — Ambulatory Visit (HOSPITAL_COMMUNITY)
Admission: RE | Admit: 2021-08-18 | Discharge: 2021-08-18 | Disposition: A | Discharge status: Home / Self Care | Payer: Medicare Other | Source: Ambulatory Visit | Attending: Physician Assistant | Admitting: Physician Assistant

## 2021-08-18 ENCOUNTER — Encounter (HOSPITAL_COMMUNITY): Payer: Self-pay | Admitting: Physician Assistant

## 2021-08-18 ENCOUNTER — Other Ambulatory Visit: Payer: Self-pay

## 2021-08-18 VITALS — BP 128/82 | HR 76 | Ht 71.0 in | Wt 166.4 lb

## 2021-08-18 DIAGNOSIS — Z7901 Long term (current) use of anticoagulants: Secondary | ICD-10-CM | POA: Insufficient documentation

## 2021-08-18 DIAGNOSIS — I2510 Atherosclerotic heart disease of native coronary artery without angina pectoris: Secondary | ICD-10-CM | POA: Diagnosis not present

## 2021-08-18 DIAGNOSIS — E785 Hyperlipidemia, unspecified: Secondary | ICD-10-CM | POA: Diagnosis not present

## 2021-08-18 DIAGNOSIS — Z79899 Other long term (current) drug therapy: Secondary | ICD-10-CM | POA: Insufficient documentation

## 2021-08-18 DIAGNOSIS — I4819 Other persistent atrial fibrillation: Secondary | ICD-10-CM

## 2021-08-18 DIAGNOSIS — D6869 Other thrombophilia: Secondary | ICD-10-CM

## 2021-08-18 DIAGNOSIS — I10 Essential (primary) hypertension: Secondary | ICD-10-CM | POA: Insufficient documentation

## 2021-08-22 DIAGNOSIS — H269 Unspecified cataract: Secondary | ICD-10-CM

## 2021-08-22 HISTORY — DX: Unspecified cataract: H26.9

## 2021-08-26 ENCOUNTER — Ambulatory Visit (INDEPENDENT_AMBULATORY_CARE_PROVIDER_SITE_OTHER): Payer: Medicare Other | Admitting: Licensed Clinical Social Worker

## 2021-08-26 ENCOUNTER — Other Ambulatory Visit: Payer: Self-pay

## 2021-08-26 ENCOUNTER — Encounter (HOSPITAL_COMMUNITY): Payer: Self-pay | Admitting: Licensed Clinical Social Worker

## 2021-08-26 DIAGNOSIS — F3162 Bipolar disorder, current episode mixed, moderate: Secondary | ICD-10-CM | POA: Diagnosis not present

## 2021-08-26 NOTE — Progress Notes (Signed)
Virtual Visit via Telephone Note  I connected with Justin [name] on 08/26/21 at  2:30 PM EST by telephone and verified that I am speaking with the correct person using two identifiers.  Location: Patient: home Provider: home office   I discussed the limitations, risks, security and privacy concerns of performing an evaluation and management service by telephone and the availability of in person appointments. I also discussed with the patient that there may be a patient responsible charge related to this service. The patient expressed understanding and agreed to proceed.   Type of Therapy: Individual Therapy   Treatment Goals addressed: "I'm really worried about my sleep issues and how they are effecting my memory and my mind, having more hallucinations, feeling out of it". Justin [name] will improve psychological functioning as evidenced by reduced experience of psychoses, improvement in sleep, and more stabilized mood 3 out of 7 days.   Interventions: Motivational Interviewing   Summary: Justin [name] is a 76 y.o. male who presents with Bipolar 1 Disorder, Mixed, Moderate   Suicidal/Homicidal: No - without intent/plan   Therapist Response: Justin [name] met with clinician for an individual session. Justin [name] discussed his psychiatric symptoms, his current life events and his homework. Justin [name] shared he had concern that his ongoing issues with son have become tiresome for this clinician. Clinician explored the concerns and provided feedback about the reasons the same issues continue to occur. Clinician utilized MI OARS to reflect and summarize thoughts and feelings shared by Justin [name] about son. Clinician also reviewed recent incidents that occurred over the holidays with son and sister. Clinician explored Justin [name]'s comfort with his decisions made to temporarily block both his son and his sister for a few days to cool down. Clinician also validated the importance of Justin [name] standing by his boundaries of how these people  have been treating him over the years. Clinician encouraged Justin [name] to seek support from truly supportive people in his network, rather than continuing to ask for help from those who have consistently been unhelpful.    Plan: Return again in 2-3 weeks.    Diagnosis: Axis I: Bipolar 1 Disorder, Mixed, Moderate        I discussed the assessment and treatment plan with the patient. The patient was provided an opportunity to ask questions and all were answered. The patient agreed with the plan and demonstrated an understanding of the instructions.   The patient was advised to call back or seek an in-person evaluation if the symptoms worsen or if the condition fails to improve as anticipated.  I provided 50 minutes of non-face-to-face time during this encounter.   [name], LCSW

## 2021-09-01 ENCOUNTER — Telehealth (HOSPITAL_COMMUNITY): Payer: Medicare Other | Admitting: Psychiatry

## 2021-09-06 NOTE — Progress Notes (Deleted)
Cardiology Office Note:    Date:  09/06/2021   ID:  Justin [name], DOB 07-22-1946, MRN [id number]  PCP:  Biagio Borg, MD  [place] [facility] Cardiologist:  Sherren Mocha, MD Cardiology APP:  Liliane Shi, PA-C { Click to update primary MD,subspecialty MD or APP then REFRESH:1}  *** Referring MD: Biagio Borg, MD   Chief Complaint:  No chief complaint on file. {Click here for Visit Info    :1}   Patient Profile: Coronary artery disease with stable angina since PCI Previously followed by Dr. Bettina Gavia in [place] - est w [place] office in 3/19 Unstable angina >> s/p DES to the LAD and DX in 2011 NSTEMI 03/2011 >> no culprit on cath; medical therapy Stress echo 2017: Normal Myoview 3/19: no ischemia Echocardiogram 3/19: EF 60-65, Gr 1 DD Echocardiogram 7/22: EF 55-60, mild LVH, Gr 2 DD, RVSP 40, mild to mod AI Myoview 7/22: low risk Persistent atrial fibrillation  S/p DCCV in 12/22 >> ERAF Mild to mod AI (echocardiogram 7/22) Dilated aortic root Echocardiogram 3/19: 40 mm Echocardiogram 7/22: 39 mm Hypertension Hyperlipidemia Anxiety/depression   Prior CV studies: LONG TERM MONITOR (8-14 DAYS) INTERPRETATION 05/26/2021 Narrative  Patient had a minimum heart rate of 47 bpm, maximum heart rate of 180 bpm, and average heart rate of 79 bpm.  Predominant underlying rhythm was atrial fibrillation (100%).  Triggered and diary events associated with atrial fibrillation (rates 60s-110s).  Echocardiogram 02/23/21 EF 55-60, no RWMA, mild LVH, Gr2 DD, mildly elevated PASP, RVSP 40, mild LAE, trivial MR, mild to mod AI, mild AV sclerosis w/o AS, dilated aortic root (39 mm)  Myoview 02/23/21 EF 64, no ischemia or scar, low risk   Echo 11/17/17 Mild conc LVH, EF 60-65, no RWMA, Gr 1 DD, mildly calcified AV leaflets, mild AI, mildly dilated aortic root (40 mm), MAC, trivial MR, mild LAE, mild RV dilation, atrial septal lipomatous hypertrophy   Nuclear stress  test 11/17/17 EF 57, inf/inf-sept/inf-lat defect c/w probable soft tissue atten, cannot exclude subendocardial scar, no ischemia; Intermediate Risk     History of Present Illness:   Justin [name] is a 76 y.o. male with the above problem list.  He was last seen in 7/22.  He had symptoms of chest pain.  I set him up for a stress test.  This was low risk and neg for ischemia.   A f/u echocardiogram for dilated aortic root demonstrated normal EF, Gr 2 diastolic dysfunction, mild to mod AI and aortic root 39 mm.    He was seen by Dr. Gasper Sells in 9/22 for new onset atrial fibrillation with CVR.  He was started on anticoagulation with Apixaban.  A f/u Zio monitor showed avg HR 79, atrial fibrillation with associated symptoms. He was set up for DCCV.  This was put on hold so that he could interrupt anticoagulation for ankle surgery.  He underwent DCCV on 08/05/21 and had f/u in the AFib clinic.  He was back in rate controlled AFib at that time.  He was referred to EP to discuss possible ablation.  He sees Dr. Quentin Ore 10/05/21.  He returns for f/u.  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[place])    Grief    History of echocardiogram    Echo 3/19: Mild concentric LVH, EF 60-65, normal wall motion, grade 1 diastolic dysfunction, mild AI, mildly dilated aortic root (40 mm), MAC, mild LAE, atrial septal lipomatous hypertrophy // Echo 8/22: EF 55-60, no RWMA, mild LVH, GRII DD, normal RVSF, RVSP 40, mild LAE, trivial MR,  mild-moderate AI, AV sclerosis without stenosis, mild dilation of aortic root (39 mm)   History of non-ST elevation myocardial infarction (NSTEMI)    History of nuclear stress test    Nuclear stress test 3/19: EF 57, inferior/inferoseptal/inferolateral defect consistent with probable soft tissue attenuation (cannot exclude subendocardial scar), no ischemia, intermediate risk >> Echo 3/19 normal EF, normal wall motion   History of pneumonia 2011   History of prostatitis 1990s   Hx of adenomatous colonic polyps 06/22/2016   Hyperlipidemia    Hypertension    Migraines    prior history   PTSD (post-traumatic stress disorder)    Current Medications: No outpatient medications have been marked as taking for the 09/07/21 encounter (Appointment) with Richardson Dopp T, PA-C.    Allergies:   Ambien [zolpidem tartrate], Ativan [lorazepam], and Seroquel [quetiapine]   Social History   Tobacco Use   Smoking status: Never   Smokeless tobacco: Never  Vaping Use   Vaping Use: Never used  Substance Use Topics   Alcohol use: Not Currently    Alcohol/week: 6.0 standard drinks    Types: 6 Cans of beer per week    Comment: 3 beers in a sitting   Drug use: No    Family Hx: The patient's family history includes Alzheimer's disease in his mother; Arthritis in his father and mother; Depression in his brother; Diabetes in his sister and sister; Heart attack in his father, mother, and sister; Heart disease in his father and mother; Hyperlipidemia in his father; Hypertension in his father and mother; Lung cancer in his maternal aunt, paternal aunt, and paternal grandmother; Multiple sclerosis in his sister; Pulmonary embolism in his sister; Stroke in his father.  ROS   EKGs/Labs/Other Test Reviewed:    EKG:  EKG is *** ordered today.  The ekg ordered today demonstrates ***  Recent Labs: 12/23/2020: ALT 30; TSH 0.70 07/21/2021: BUN 14; Creatinine, Ser 1.03; Hemoglobin 13.1; Platelets 228; Potassium 3.8;  Sodium 132   Recent Lipid Panel Recent Labs    12/23/20 1113  CHOL 141  TRIG 40.0  HDL 69.30  VLDL 8.0  LDLCALC 63     Risk Assessment/Calculations:   {Does this patient have ATRIAL FIBRILLATION?:[phone]}     Physical Exam:    VS:  There were no vitals taken for this visit.    Wt Readings from Last 3 Encounters:  08/18/21 166 lb 6.4 oz (75.5 kg)  08/05/21 164 lb (74.4 kg)  07/21/21 164 lb (74.4 kg)    Physical Exam ***     ASSESSMENT & PLAN:   No problem-specific Assessment & Plan notes found for this encounter. 1. Coronary artery disease involving native coronary artery of native heart with angina pectoris ([place]) 2. Precordial chest pain History  of drug-eluting stent to the LAD and diagonal 2011.  He suffered a non-ST elevation myocardial infarction in 2012 without culprit lesion and he has been managed medically.  Stress test in 2019 was negative for ischemia.  He has had chest symptoms over the past 6 months.  It may be getting more frequent.  He has not really had to take nitroglycerin except for on 1 occasion.  His EKG does not demonstrate any significant changes.  I have recommended proceeding with stress testing to further assess his symptoms.  Also, arrange follow-up echocardiogram.  He does not take PDE-5 inhibitors.  Therefore, I will start him on isosorbide 30 mg daily.  Continue current dose of diltiazem, clopidogrel, rosuvastatin.  Follow-up 6-8 weeks.   3. Essential hypertension Blood pressure above target.  I will start him on isosorbide as noted.  Continue current dose of diltiazem.   4. Hyperlipidemia, unspecified hyperlipidemia type LDL optimal on most recent lab work.  Continue current Rx.     5. 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  Arrange follow-up echocardiogram.        {Are you ordering a CV Procedure (e.g. stress test, cath, DCCV, TEE, etc)?   Press F2        :[id number]}  Dispo:  No follow-ups on file.   Medication  Adjustments/Labs and Tests Ordered: Current medicines are reviewed at length with the patient today.  Concerns regarding medicines are outlined above.  Tests Ordered: No orders of the defined types were placed in this encounter.  Medication Changes: No orders of the defined types were placed in this encounter.  Signed, Richardson Dopp, PA-C  09/06/2021 10:01 PM    [facility] [place], [place], [place]  [address] Phone: [phone]; Fax: [phone]

## 2021-09-07 ENCOUNTER — Ambulatory Visit: Payer: Medicare Other | Admitting: Physician Assistant

## 2021-09-07 DIAGNOSIS — I10 Essential (primary) hypertension: Secondary | ICD-10-CM

## 2021-09-07 DIAGNOSIS — I7121 Aneurysm of the ascending aorta, without rupture: Secondary | ICD-10-CM

## 2021-09-07 DIAGNOSIS — I4819 Other persistent atrial fibrillation: Secondary | ICD-10-CM

## 2021-09-07 DIAGNOSIS — I25119 Atherosclerotic heart disease of native coronary artery with unspecified angina pectoris: Secondary | ICD-10-CM

## 2021-09-07 DIAGNOSIS — E7800 Pure hypercholesterolemia, unspecified: Secondary | ICD-10-CM

## 2021-09-08 ENCOUNTER — Other Ambulatory Visit: Payer: Self-pay

## 2021-09-08 ENCOUNTER — Encounter (HOSPITAL_COMMUNITY): Payer: Self-pay | Admitting: Licensed Clinical Social Worker

## 2021-09-08 ENCOUNTER — Ambulatory Visit (INDEPENDENT_AMBULATORY_CARE_PROVIDER_SITE_OTHER): Payer: Medicare Other | Admitting: Licensed Clinical Social Worker

## 2021-09-08 DIAGNOSIS — F3162 Bipolar disorder, current episode mixed, moderate: Secondary | ICD-10-CM

## 2021-09-08 NOTE — Progress Notes (Signed)
Virtual Visit via Telephone Note  I connected with Justin [name] on 09/08/21 at  2:30 PM EST by telephone and verified that I am speaking with the correct person using two identifiers.  Location: Patient: home Provider: home office   I discussed the limitations, risks, security and privacy concerns of performing an evaluation and management service by telephone and the availability of in person appointments. I also discussed with the patient that there may be a patient responsible charge related to this service. The patient expressed understanding and agreed to proceed.   Type of Therapy: Individual Therapy   Treatment Goals addressed: "I'm really worried about my sleep issues and how they are effecting my memory and my mind, having more hallucinations, feeling out of it". Justin [name] will improve psychological functioning as evidenced by reduced experience of psychoses, improvement in sleep, and more stabilized mood 3 out of 7 days.   Interventions: Motivational Interviewing   Summary: Justin [name] is a 76 y.o. male who presents with Bipolar 1 Disorder, Mixed, Moderate   Suicidal/Homicidal: No - without intent/plan   Therapist Response: Justin [name] met with clinician for an individual session. Justin [name] discussed his psychiatric symptoms, his current life events and his homework. Justin [name] shared he continues to struggle with depression. He reported some real suicidal thoughts last week, but reports those have subsided. Clinician utilized MI OARS to reflect and summarize the triggers to this depression. Clinician noted that memory has continued to get worse with short term topics. Clinician also processed the long term impact of the instability in his relationship with son and grandchildren. Clinician identified the importance of maintaining himself and working to control himself, as he has no control over others. Clinician processed recent visit with daughter, for the first time in several years. Clinician  explored Justin [name]'s willingness to participate in a relationship with her in the future. He shared that he was uncertain what that will look like.    Plan: Return again in 2-3 weeks. Clinician notified Dr. Adele [name] about SI last week, as well as memory loss issues.    Diagnosis: Axis I: Bipolar 1 Disorder, Mixed, Moderate        I discussed the assessment and treatment plan with the patient. The patient was provided an opportunity to ask questions and all were answered. The patient agreed with the plan and demonstrated an understanding of the instructions.   The patient was advised to call back or seek an in-person evaluation if the symptoms worsen or if the condition fails to improve as anticipated.  I provided 45 minutes of non-face-to-face time during this encounter.   [name], LCSW

## 2021-09-10 ENCOUNTER — Telehealth (HOSPITAL_BASED_OUTPATIENT_CLINIC_OR_DEPARTMENT_OTHER): Payer: Medicare Other | Admitting: Psychiatry

## 2021-09-10 ENCOUNTER — Other Ambulatory Visit: Payer: Self-pay

## 2021-09-10 ENCOUNTER — Encounter (HOSPITAL_COMMUNITY): Payer: Self-pay | Admitting: Psychiatry

## 2021-09-10 DIAGNOSIS — F3162 Bipolar disorder, current episode mixed, moderate: Secondary | ICD-10-CM | POA: Diagnosis not present

## 2021-09-10 DIAGNOSIS — F4310 Post-traumatic stress disorder, unspecified: Secondary | ICD-10-CM

## 2021-09-10 MED ORDER — LAMOTRIGINE 25 MG PO TABS: ORAL_TABLET | ORAL | 2 refills | Status: DC

## 2021-09-10 MED ORDER — LAMOTRIGINE 200 MG PO TABS: 200.0000 mg | ORAL_TABLET | Freq: Every day | ORAL | 2 refills | Status: DC

## 2021-09-10 NOTE — Progress Notes (Signed)
Virtual Visit via Telephone Note  I connected with Justin [name] on 09/10/21 at 10:40 AM EST by telephone and verified that I am speaking with the correct person using two identifiers.  Location: Patient: Home Provider: Home Office   I discussed the limitations, risks, security and privacy concerns of performing an evaluation and management service by telephone and the availability of in person appointments. I also discussed with the patient that there may be a patient responsible charge related to this service. The patient expressed understanding and agreed to proceed.   History of Present Illness: Patient is evaluated by phone session.  He is still struggling with his chronic right foot pain.  He is able to come off from the boot but now reading braces.  He is getting physical therapy but lately due to swelling he cannot wear shoes and then he cannot drive.  But he is pleased that please he started to drive little bit.  He continues to struggle with his son and he was very sad and disappointed because not able to see the grandkids on Christmas.  Patient has 4 grandkids and he is upset because they spent time with his son's wife parents on Christmas.  Patient told his son called after the Christmas but he was sleeping and he never received a phone call back.  Patient is in therapy with Janett Billow.  He also have other health issues which is A. fib, cataract and now he had a procedure for his eye and may need cardiac ablation.  He is not sure who will take him to the appointment because his son again not talking to him.  However he started to realize that he may need other resources rather depending on his son.  Last time when his son have the argument on the phone he did not felt bad and that helps.  He denies any hallucination, paranoia or any suicidal thoughts.  He is no longer taking narcotic pain medication which was given because of foot pain.  In the beginning he had some withdrawals and he could not  sleep because pain medicine was making him drowsy.  Now he started taking Xanax prescribed by PCP 0.5 to 1 mg and his sleep is better.  Denies any suicidal thoughts but admitted sometime negative and ruminative thoughts.  When I ask about his medication compliance he replied that he is taking Lamictal 200 mg and extra 25 mg at bedtime.  He forgot to take Lamictal 25 mg in the morning.  He did not like the Lamictal which was increased on the last visit.  So far he has no rash, itching, tremors.  He feels a little helping his mood and irritability and frustration.  He is also happy to see her daughter after he had to have who lives other side of the [place].  Patient was pleased because she came with her husband.  Occasionally he has a nightmares and flashback.  His appetite is okay.  He denies any major panic attack.  He denies any severe highs and lows or any mania.  He is open to try to take the Lamictal 25 mg in the morning extra which was prescribed but he forgot.  Past Psychiatric History: Reviewed. H/O suicidal attempt by overdose on Ambien twice, hydroxyzine and alcohol. H/O bipolar disorder with multiple inpatient.  Did IOP. Last ER visit Sept 2020 and inpatient October 2018 at [facility]. Tried Zyprexa, Cymbalta, Seroquel, Vistaril, Neurontin, Wellbutrin and triptal. Tried low-dose doxepin and trazodone. Had a  reaction with Ativan and pain medication.  Saw Dr. Letta Moynahan in past.  Psychiatric Specialty Exam: Physical Exam  Review of Systems  Musculoskeletal:        Right foot pain   Weight 166 lb (75.3 kg).There is no height or weight on file to calculate BMI.  General Appearance: NA  Eye Contact:  NA  Speech:  Clear and Coherent  Volume:  Normal  Mood:  Dysphoric  Affect:  NA  Thought Process:  Goal Directed  Orientation:  Full (Time, Place, and Person)  Thought Content:  Rumination  Suicidal Thoughts:  No  Homicidal Thoughts:  No  Memory:  Immediate;   Good Recent;   Good Remote;    Fair  Judgement:  Intact  Insight:  Present  Psychomotor Activity:  NA  Concentration:  Concentration: Fair and Attention Span: Fair  Recall:  Good  Fund of Knowledge:  Good  Language:  Good  Akathisia:  No  Handed:  Right  AIMS (if indicated):     Assets:  Communication Skills Desire for Improvement Housing Transportation  ADL's:  Intact  Cognition:  WNL  Sleep:   fair      Assessment and Plan: Bipolar disorder type I.  PTSD.  Anxiety.  Discuss family situation.  Patient is still have complex relationship with his son.  Patient also has multiple health issues and now realizes he may need to find other resources to take him to the doctor's appointment.  He has upcoming procedure for his eye and may need cardiac ablation and may need to stay in the hospital.  He does not want to go back to [employer] even though now he feels he can afford but he will take lamotrigine 25 mg in the morning.  He feels overall his mood is better with Lamictal.  He has no rash or any itching.  Discuss about his expectation from his son and he realized that he needed to be more resilience.  He is looking into other resources like asking for his neighbor to get help.  I also recommend he should contact the doctor's office if they have any resources for transportation so that he can do the procedure.  He started driving slowly but due to leg pain and swelling sometimes he does not drive for long distance.  Encouraged to continue therapy with Janett Billow.  Patient will take now Lamictal 200 mg daily and Lamictal 25 mg twice a day.  Recommended to call us back if is any question or any concern.  Discussed safety concerns and anytime having active suicidal thoughts or homicidal thought that he need to call 911 or go to [place] soon.  Follow up in 3 months.  Follow Up Instructions:    I discussed the assessment and treatment plan with the patient. The patient was provided an opportunity to ask questions and all were  answered. The patient agreed with the plan and demonstrated an understanding of the instructions.   The patient was advised to call back or seek an in-person evaluation if the symptoms worsen or if the condition fails to improve as anticipated.  I provided 35 minutes of non-face-to-face time during this encounter.   Kathlee Nations, MD

## 2021-09-23 ENCOUNTER — Other Ambulatory Visit: Payer: Self-pay

## 2021-09-23 ENCOUNTER — Ambulatory Visit (INDEPENDENT_AMBULATORY_CARE_PROVIDER_SITE_OTHER): Payer: Medicare Other | Admitting: Licensed Clinical Social Worker

## 2021-09-23 DIAGNOSIS — F3162 Bipolar disorder, current episode mixed, moderate: Secondary | ICD-10-CM | POA: Diagnosis not present

## 2021-09-23 DIAGNOSIS — F4321 Adjustment disorder with depressed mood: Secondary | ICD-10-CM

## 2021-09-30 ENCOUNTER — Encounter (HOSPITAL_COMMUNITY): Payer: Self-pay | Admitting: Licensed Clinical Social Worker

## 2021-09-30 NOTE — Progress Notes (Signed)
Virtual Visit via Telephone Note  I connected with Justin [name] on 09/30/21 at  2:30 PM EST by telephone and verified that I am speaking with the correct person using two identifiers.  Location: Patient: home Provider: home office   I discussed the limitations, risks, security and privacy concerns of performing an evaluation and management service by telephone and the availability of in person appointments. I also discussed with the patient that there may be a patient responsible charge related to this service. The patient expressed understanding and agreed to proceed.  Type of Therapy: Individual Therapy   Treatment Goals addressed: "I'm really worried about my sleep issues and how they are effecting my memory and my mind, having more hallucinations, feeling out of it". [name] will improve psychological functioning as evidenced by reduced experience of psychoses, improvement in sleep, and more stabilized mood 3 out of 7 days.   Interventions: Motivational Interviewing   Summary: Ventura Hollenbeck is a 76 y.o. male who presents with Bipolar 1 Disorder, Mixed, Moderate   Suicidal/Homicidal: No - without intent/plan   Therapist Response: Mateen met with clinician for an individual session. [name] discussed his psychiatric symptoms, his current life events and his homework. [name] shared that he has been depressed and feeling hopeless about his relationships with his children. Clinician utilized MI OARS to reflect and summarize his thoughts and feelings about not being able to count on his family. Clinician offered supportive counseling and assisted with identifying alternative supports, including neighbors or hired help. Clinician processed the disappointment and explored what wife would have told him. Clinician also noted that [name] has finally decided to move his wife's clothing out of the closet, as it has been 6 years since she died. Clinician reviewed this experience and identified the  importance of taking his time doing these things, taking time to weep if necessary, and taking time to remember their life together.    Plan: Return again in 2-3 weeks.     Diagnosis: Axis I: Bipolar 1 Disorder, Mixed, Moderate          I discussed the assessment and treatment plan with the patient. The patient was provided an opportunity to ask questions and all were answered. The patient agreed with the plan and demonstrated an understanding of the instructions.   The patient was advised to call back or seek an in-person evaluation if the symptoms worsen or if the condition fails to improve as anticipated.  I provided 45 minutes of non-face-to-face time during this encounter.   [name], LCSW

## 2021-10-05 ENCOUNTER — Institutional Professional Consult (permissible substitution): Payer: Medicare Other | Admitting: Cardiology

## 2021-10-06 ENCOUNTER — Encounter (HOSPITAL_COMMUNITY): Payer: Self-pay | Admitting: Licensed Clinical Social Worker

## 2021-10-06 ENCOUNTER — Other Ambulatory Visit: Payer: Self-pay

## 2021-10-06 ENCOUNTER — Ambulatory Visit (INDEPENDENT_AMBULATORY_CARE_PROVIDER_SITE_OTHER): Payer: Medicare Other | Admitting: Licensed Clinical Social Worker

## 2021-10-06 DIAGNOSIS — F3162 Bipolar disorder, current episode mixed, moderate: Secondary | ICD-10-CM

## 2021-10-06 NOTE — Progress Notes (Signed)
Virtual Visit via Telephone Note  I connected with Justin [name] on 10/06/21 at  2:30 PM EST by telephone and verified that I am speaking with the correct person using two identifiers.  Location: Patient: home Provider: home office   I discussed the limitations, risks, security and privacy concerns of performing an evaluation and management service by telephone and the availability of in person appointments. I also discussed with the patient that there may be a patient responsible charge related to this service. The patient expressed understanding and agreed to proceed.  Type of Therapy: Individual Therapy   Treatment Goals addressed: "I'm really worried about my sleep issues and how they are effecting my memory and my mind, having more hallucinations, feeling out of it". Justin [name] will improve psychological functioning as evidenced by reduced experience of psychoses, improvement in sleep, and more stabilized mood 3 out of 7 days.   Interventions: Motivational Interviewing   Summary: Justin [name] is a 76 y.o. male who presents with Bipolar 1 Disorder, Mixed, Moderate   Suicidal/Homicidal: No - without intent/plan   Therapist Response: Justin [name] met with clinician for an individual session. Justin [name] discussed his psychiatric symptoms, his current life events and his homework. Justin [name] shared that he continues to struggle with his relationship with son and sister. Clinician utilized MI OARS to reflect and summarize thoughts and feelings about these relationships. Clinician processed willingness to back off from these relationships, as they tend to be detrimental to Justin [name] emotional and mental health. Clinician also reminded Justin [name] that he is responsible for his own peace and happiness. He agreed and also identified worry that one day they would come around and want to be supportive and he will not respond. Clinician identified that this is a possibility, but is this something that he wants to spend his  time waiting for. Clinician explored Justin [name] socialization and noted that he sent flowers to his hairdresser recently. Clinician processed this event and identified some regret for doing this, but also some pride for stepping out of his comfort zone.    Plan: Return again in 2-3 weeks.     Diagnosis: Axis I: Bipolar 1 Disorder, Mixed, Moderate      I discussed the assessment and treatment plan with the patient. The patient was provided an opportunity to ask questions and all were answered. The patient agreed with the plan and demonstrated an understanding of the instructions.   The patient was advised to call back or seek an in-person evaluation if the symptoms worsen or if the condition fails to improve as anticipated.  I provided 45 minutes of non-face-to-face time during this encounter.   [name], LCSW

## 2021-10-11 ENCOUNTER — Other Ambulatory Visit: Payer: Self-pay | Admitting: Internal Medicine

## 2021-10-12 ENCOUNTER — Ambulatory Visit: Payer: Medicare Other | Admitting: Internal Medicine

## 2021-10-12 ENCOUNTER — Encounter: Payer: Self-pay | Admitting: Internal Medicine

## 2021-10-12 ENCOUNTER — Other Ambulatory Visit: Payer: Self-pay

## 2021-10-12 VITALS — BP 138/76 | HR 96 | Ht 71.0 in | Wt 170.0 lb

## 2021-10-12 DIAGNOSIS — E538 Deficiency of other specified B group vitamins: Secondary | ICD-10-CM | POA: Diagnosis not present

## 2021-10-12 DIAGNOSIS — E7800 Pure hypercholesterolemia, unspecified: Secondary | ICD-10-CM

## 2021-10-12 DIAGNOSIS — Z0001 Encounter for general adult medical examination with abnormal findings: Secondary | ICD-10-CM

## 2021-10-12 DIAGNOSIS — F411 Generalized anxiety disorder: Secondary | ICD-10-CM

## 2021-10-12 DIAGNOSIS — R739 Hyperglycemia, unspecified: Secondary | ICD-10-CM

## 2021-10-12 DIAGNOSIS — E559 Vitamin D deficiency, unspecified: Secondary | ICD-10-CM

## 2021-10-12 DIAGNOSIS — Z23 Encounter for immunization: Secondary | ICD-10-CM | POA: Diagnosis not present

## 2021-10-12 LAB — LIPID PANEL
Cholesterol: 137 mg/dL (ref 0–200)
HDL: 77.8 mg/dL (ref >39.00)
LDL Cholesterol: 50 mg/dL (ref 0–99)
NonHDL: 59.26
Total CHOL/HDL Ratio: 2
Triglycerides: 44 mg/dL (ref 0.0–149.0)
VLDL: 8.8 mg/dL (ref 0.0–40.0)

## 2021-10-12 LAB — BASIC METABOLIC PANEL
BUN: 20 mg/dL (ref 6–23)
CO2: 29 mEq/L (ref 19–32)
Calcium: 9.6 mg/dL (ref 8.4–10.5)
Chloride: 99 mEq/L (ref 96–112)
Creatinine, Ser: 0.88 mg/dL (ref 0.40–1.50)
GFR: 84.18 mL/min (ref >60.00)
Glucose, Bld: 104 mg/dL — ABNORMAL HIGH (ref 70–99)
Potassium: 4.5 mEq/L (ref 3.5–5.1)
Sodium: 132 mEq/L — ABNORMAL LOW (ref 135–145)

## 2021-10-12 LAB — HEPATIC FUNCTION PANEL
ALT: 19 U/L (ref 0–53)
AST: 25 U/L (ref 0–37)
Albumin: 4.7 g/dL (ref 3.5–5.2)
Alkaline Phosphatase: 66 U/L (ref 39–117)
Bilirubin, Direct: 0.2 mg/dL (ref 0.0–0.3)
Total Bilirubin: 1 mg/dL (ref 0.2–1.2)
Total Protein: 6.8 g/dL (ref 6.0–8.3)

## 2021-10-12 LAB — CBC WITH DIFFERENTIAL/PLATELET
Basophils Absolute: 0 10*3/uL (ref 0.0–0.1)
Basophils Relative: 0.8 % (ref 0.0–3.0)
Eosinophils Absolute: 0 10*3/uL (ref 0.0–0.7)
Eosinophils Relative: 0.9 % (ref 0.0–5.0)
HCT: 40.7 % (ref 39.0–52.0)
Hemoglobin: 13.5 g/dL (ref 13.0–17.0)
Lymphocytes Relative: 17.2 % (ref 12.0–46.0)
Lymphs Abs: 0.8 10*3/uL (ref 0.7–4.0)
MCHC: 33 g/dL (ref 30.0–36.0)
MCV: 97.5 fl (ref 78.0–100.0)
Monocytes Absolute: 0.5 10*3/uL (ref 0.1–1.0)
Monocytes Relative: 10.8 % (ref 3.0–12.0)
Neutro Abs: 3.3 10*3/uL (ref 1.4–7.7)
Neutrophils Relative %: 70.3 % (ref 43.0–77.0)
Platelets: 215 10*3/uL (ref 150.0–400.0)
RBC: 4.18 Mil/uL — ABNORMAL LOW (ref 4.22–5.81)
RDW: 13.8 % (ref 11.5–15.5)
WBC: 4.6 10*3/uL (ref 4.0–10.5)

## 2021-10-12 LAB — MICROALBUMIN / CREATININE URINE RATIO
Creatinine,U: 119 mg/dL
Microalb Creat Ratio: 0.8 mg/g (ref 0.0–30.0)
Microalb, Ur: 1 mg/dL (ref 0.0–1.9)

## 2021-10-12 LAB — TSH: TSH: 0.63 u[IU]/mL (ref 0.35–5.50)

## 2021-10-12 LAB — PSA: PSA: 2.17 ng/mL (ref 0.10–4.00)

## 2021-10-12 LAB — HEMOGLOBIN A1C: Hgb A1c MFr Bld: 4.6 % (ref 4.6–6.5)

## 2021-10-12 LAB — VITAMIN D 25 HYDROXY (VIT D DEFICIENCY, FRACTURES): VITD: 38.1 ng/mL (ref 30.00–100.00)

## 2021-10-12 LAB — VITAMIN B12: Vitamin B-12: 304 pg/mL (ref 211–911)

## 2021-10-12 NOTE — Assessment & Plan Note (Addendum)
Age and sex appropriate education and counseling updated with regular exercise and diet Referrals for preventative services - none needed Immunizations addressed - decilnes covid, shingrix Smoking counseling  - none needed Evidence for depression or other mood disorder - stable anxiety/depression Most recent labs reviewed. I have personally reviewed and have noted: 1) the patient's medical and social history 2) The patient's current medications and supplements 3) The patient's height, weight, and BMI have been recorded in the chart

## 2021-10-12 NOTE — Assessment & Plan Note (Signed)
Lab Results  Component Value Date   HGBA1C 4.7 12/23/2020   Stable, pt to continue current medical treatment  - diet

## 2021-10-12 NOTE — Patient Instructions (Addendum)
You had the flu shot today  Please continue all other medications as before, and refills have been done if requested.  Please have the pharmacy call with any other refills you may need.  Please continue your efforts at being more active, low cholesterol diet, and weight control.  You are otherwise up to date with prevention measures today.  Please keep your appointments with your specialists as you may have planned  .Please go to the LAB at the blood drawing area for the tests to be done  You will be contacted by phone if any changes need to be made immediately.  Otherwise, you will receive a letter about your results with an explanation, but please check with MyChart first.  Please remember to sign up for MyChart if you have not done so, as this will be important to you in the future with finding out test results, communicating by private email, and scheduling acute appointments online when needed.  Please make an Appointment to return in 6 months, or sooner if needed  

## 2021-10-12 NOTE — Assessment & Plan Note (Signed)
Lab Results  Component Value Date   LDLCALC 63 12/23/2020   Stable, pt to continue current statin crestor 20

## 2021-10-12 NOTE — Assessment & Plan Note (Signed)
Last vitamin D Lab Results  Component Value Date   VD25OH 27.62 (L) 12/23/2020   Low, to start oral replacement

## 2021-10-12 NOTE — Progress Notes (Signed)
Patient ID: Justin [name], male   DOB: [birth year], 76 y.o.   MRN: [id number]         Chief Complaint:: wellness exam and anxiety, hyperglycemia, hld, low vit d       HPI:  Justin [name] is a 76 y.o. male here for wellness exam; declines covid booster, shingrix, o/w up to date                        Also s/p right ankle surgury oct 2022; still in PT., still has numbness to the foot new, forf/u soon.  Also has new afib per cardiology s/p failed cardioversoin, now considering ablation possibly in April.  Not taking Vit  d.  Pt denies chest pain, increased sob or doe, wheezing, orthopnea, PND, increased LE swelling, palpitations, dizziness or syncope.   Pt denies polydipsia, polyuria, or new focal neuro s/s.   Pt denies fever, wt loss, night sweats, loss of appetite, or other constitutional symptoms  Denies worsening depressive symptoms, suicidal ideation, or panic; for flu shot Wt Readings from Last 3 Encounters:  10/12/21 170 lb (77.1 kg)  08/18/21 166 lb 6.4 oz (75.5 kg)  08/05/21 164 lb (74.4 kg)   BP Readings from Last 3 Encounters:  10/12/21 138/76  08/18/21 128/82  08/05/21 123/69   Immunization History  Administered Date(s) Administered   Fluad Quad(high Dose 65+) 05/15/2019, 10/12/2021   Influenza, High Dose Seasonal PF 05/05/2016, 05/22/2018   Influenza,inj,Quad PF,6+ Mos 06/26/2015   PFIZER(Purple Top)SARS-COV-2 Vaccination 10/24/2019, 11/19/2019, 05/04/2020   Pneumococcal Conjugate-13 08/07/2015, 05/05/2016   Pneumococcal Polysaccharide-23 06/09/2016   Td 03/26/2015   Zoster Recombinat (Shingrix) 10/03/2016   Zoster, Live 03/26/2015   There are no preventive care reminders to display for this patient.     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[place])    Grief    History of echocardiogram    Echo 3/19: Mild concentric LVH, EF 60-65, normal wall motion, grade 1 diastolic dysfunction, mild AI, mildly dilated aortic root (40 mm), MAC, mild LAE, atrial septal lipomatous hypertrophy // Echo 8/22: EF 55-60, no RWMA, mild LVH, GRII DD, normal RVSF, RVSP 40, mild LAE, trivial MR, mild-moderate AI, AV sclerosis without stenosis, mild dilation of aortic root (39 mm)   History of non-ST elevation myocardial infarction (NSTEMI)    History of nuclear stress test    Nuclear stress test 3/19: EF 57, inferior/inferoseptal/inferolateral defect consistent with probable soft tissue attenuation (cannot exclude subendocardial scar), no ischemia, intermediate risk >> Echo 3/19 normal EF, normal wall motion   History of pneumonia 2011   History of prostatitis 1990s   Hx of adenomatous colonic polyps 06/22/2016   Hyperlipidemia    Hypertension    Migraines    prior history   PTSD (post-traumatic stress disorder)    Past Surgical  History:  Procedure Laterality Date   CARDIOVERSION N/A 08/05/2021   Procedure: CARDIOVERSION;  Surgeon: Nahser, Wonda Cheng, MD;  Location: [facility];  Service: Cardiovascular;  Laterality: N/A;   CERVICAL DISCECTOMY     COLONOSCOPY  03/2017   ELBOW SURGERY Left    ELBOW SURGERY Right    INGUINAL HERNIA REPAIR Bilateral    1996, 1997   KNEE ARTHROSCOPY Right    KNEE CARTILAGE SURGERY Left    NASAL SEPTUM SURGERY     STENT PLACEMENT VASCULAR ([facility])  09/02/2010   TONSILLECTOMY     TOTAL KNEE ARTHROPLASTY Left 08/31/2018   Procedure: TOTAL KNEE ARTHROPLASTY;  Surgeon: Dorna Leitz, MD;  Location: [facility];  Service: Orthopedics;  Laterality: Left;    reports that he has  never smoked. He has never used smokeless tobacco. He reports that he does not currently use alcohol after a past usage of about 6.0 standard drinks per week. He reports that he does not use drugs. family history includes Alzheimer's disease in his mother; Arthritis in his father and mother; Depression in his brother; Diabetes in his sister and sister; Heart attack in his father, mother, and sister; Heart disease in his father and mother; Hyperlipidemia in his father; Hypertension in his father and mother; Lung cancer in his maternal aunt, paternal aunt, and paternal grandmother; Multiple sclerosis in his sister; Pulmonary embolism in his sister; Stroke in his father. Allergies  Allergen Reactions   Ambien [Zolpidem Tartrate] Other (See Comments)    Blackout, memory issues   Ativan [Lorazepam] Other (See Comments)    Made him feel completely out of it fell down   Seroquel [Quetiapine]     SEVERE NIGHTMARES, SLEEPWALK AND NIGHT DRIVE WITH NO RECOLLECTION UPON WAKENING   Current Outpatient Medications on File Prior to Visit  Medication Sig Dispense Refill   ALPRAZolam (XANAX) 1 MG tablet TAKE 1/2 TO 1 TABLET BY MOUTH ONCE DAILY 30 tablet 2   apixaban (ELIQUIS) 5 MG TABS tablet Take 1 tablet (5 mg total) by mouth 2 (two) times daily. 180 tablet 3   diltiazem (CARDIZEM CD) 180 MG 24 hr capsule TAKE 1 CAPSULE BY MOUTH DAILY 30 capsule 11   gabapentin (NEURONTIN) 300 MG capsule Take 300 mg by mouth 3 (three) times daily.     lamoTRIgine (LAMICTAL) 200 MG tablet Take 1 tablet (200 mg total) by mouth daily. For mood control 30 tablet 2   lamoTRIgine (LAMICTAL) 25 MG tablet Take one tab twice daily with 200 mg daily Lamictal. 30 tablet 2   meloxicam (MOBIC) 15 MG tablet Take 15 mg by mouth daily.     nitroGLYCERIN (NITROSTAT) 0.4 MG SL tablet Place 1 tablet (0.4 mg total) under the tongue every 5 (five) minutes as needed for chest pain. 25 tablet 3   rosuvastatin (CRESTOR) 20 MG tablet One tablet by  mouth ( 20 mg) daily. 30 tablet 11   isosorbide mononitrate (IMDUR) 30 MG 24 hr tablet Take 1 tablet (30 mg total) by mouth daily. 30 tablet 11   No current facility-administered medications on file prior to visit.        ROS:  All others reviewed and negative.  Objective        PE:  BP 138/76 (BP Location: Right Arm, Patient Position: Sitting, Cuff Size: Large)    Pulse 96    Ht _0  (1.803 m)    Wt 170 lb (77.1 kg)    SpO2 98%    BMI 23.71  kg/m                 Constitutional: Pt appears in NAD               HENT: Head: NCAT.                Right Ear: External ear normal.                 Left Ear: External ear normal.                Eyes: . Pupils are equal, round, and reactive to light. Conjunctivae and EOM are normal               Nose: without d/c or deformity               Neck: Neck supple. Gross normal ROM               Cardiovascular: Normal rate and regular rhythm.                 Pulmonary/Chest: Effort normal and breath sounds without rales or wheezing.                Abd:  Soft, NT, ND, + BS, no organomegaly               Neurological: Pt is alert. At baseline orientation, motor grossly intact               Skin: Skin is warm. No rashes, no other new lesions, LE edema - none               Psychiatric: Pt behavior is normal without agitation   Micro: none  Cardiac tracings I have personally interpreted today:  none  Pertinent Radiological findings (summarize): none   Lab Results  Component Value Date   WBC 5.1 07/21/2021   HGB 13.1 07/21/2021   HCT 41.2 07/21/2021   PLT 228 07/21/2021   GLUCOSE 100 (H) 07/21/2021   CHOL 141 12/23/2020   TRIG 40.0 12/23/2020   HDL 69.30 12/23/2020   LDLCALC 63 12/23/2020   ALT 30 12/23/2020   AST 38 (H) 12/23/2020   NA 132 (L) 07/21/2021   K 3.8 07/21/2021   CL 100 07/21/2021   CREATININE 1.03 07/21/2021   BUN 14 07/21/2021   CO2 24 07/21/2021   TSH 0.70 12/23/2020   PSA 2.09 12/23/2020   INR 0.95 08/21/2018   HGBA1C  4.7 12/23/2020   Assessment/Plan:  Justin [name] is a 76 y.o. 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  Vitamin D deficiency Last vitamin D Lab Results  Component Value Date   VD25OH 27.62 (L) 12/23/2020   Low, to start oral replacement   Encounter for well adult exam with abnormal findings Age and sex appropriate education and counseling updated with regular exercise and diet Referrals for preventative services - none needed Immunizations addressed - decilnes covid, shingrix Smoking counseling  - none needed Evidence for depression or other mood disorder - stable anxiety/depression Most recent labs reviewed. I have personally reviewed and have noted: 1) the patient's medical and social  history 2) The patient's current medications and supplements 3) The patient's height, weight, and BMI have been recorded in the chart   Hyperglycemia Lab Results  Component Value Date   HGBA1C 4.7 12/23/2020   Stable, pt to continue current medical treatment  - diet   Hyperlipidemia Lab Results  Component Value Date   LDLCALC 63 12/23/2020   Stable, pt to continue current statin crestor 20   Generalized anxiety disorder Stable conttinue current med tx - xanax prn  Followup: Return in about 6 months (around 04/11/2022).  Cathlean Cower, MD 10/12/2021 8:54 PM [facility]

## 2021-10-12 NOTE — Assessment & Plan Note (Signed)
Stable conttinue current med tx - xanax prn

## 2021-10-13 ENCOUNTER — Telehealth: Payer: Self-pay | Admitting: Internal Medicine

## 2021-10-13 LAB — URINALYSIS, ROUTINE W REFLEX MICROSCOPIC
Bilirubin Urine: NEGATIVE
Hgb urine dipstick: NEGATIVE
Leukocytes,Ua: NEGATIVE
Nitrite: NEGATIVE
Specific Gravity, Urine: 1.02 (ref 1.000–1.030)
Total Protein, Urine: NEGATIVE
Urine Glucose: NEGATIVE
Urobilinogen, UA: 0.2 (ref 0.0–1.0)
pH: 6.5 (ref 5.0–8.0)

## 2021-10-13 NOTE — Telephone Encounter (Signed)
Patient called office this am and states he has questions regarding his lab results. Patient states he seen all the abnormal flags on Mychart and was a little concern. Please advise   Patient requesting a call back

## 2021-10-13 NOTE — Telephone Encounter (Signed)
Called patient to inform him that his lab results are stable and no need for further testing or treatment per Dr. Jenny Reichmann. Patient verbalize understanding . No further questions

## 2021-10-13 NOTE — Telephone Encounter (Signed)
Fortunately none of the flags are significant, no need for further testing or treatment, and can be further explained at next visit if he likes

## 2021-10-13 NOTE — Telephone Encounter (Signed)
Pt checking status of 10-12-2021 lab results, informed pt of provider's 10-13-2021 result notes and recommendations  Pt has additional questions and requesting a c/b

## 2021-10-20 ENCOUNTER — Other Ambulatory Visit: Payer: Self-pay

## 2021-10-20 ENCOUNTER — Ambulatory Visit (HOSPITAL_COMMUNITY): Payer: Medicare Other | Admitting: Licensed Clinical Social Worker

## 2021-10-26 ENCOUNTER — Telehealth: Payer: Self-pay | Admitting: *Deleted

## 2021-10-26 NOTE — Telephone Encounter (Signed)
? ?  Patient Name: Justin [name]  ?DOB: October 17, 1945 ?MRN: [id number] ? ?Primary Cardiologist: Sherren Mocha, MD ? ?Chart reviewed as part of pre-operative protocol coverage. Cataract extractions are recognized in guidelines as low risk surgeries that do not typically require specific preoperative testing or holding of blood thinner therapy. Therefore, given past medical history and time since last visit, based on ACC/AHA guidelines, Justin [name] would be at acceptable risk for the planned procedure without further cardiovascular testing.  ? ?I will route this recommendation to the requesting party via Epic fax function and remove from pre-op pool. ? ?Please call with questions. ? ?Emmaline Life, NP-C ? ?  ?10/26/2021, 3:06 PM ?[facility] ?[address] ?Office [phone] Fax [phone] ? ?

## 2021-10-26 NOTE — Telephone Encounter (Signed)
? ?  Pre-operative Risk Assessment  ?  ?Patient Name: Justin [name]  ?DOB: [birth year] ?MRN: [id number]  ? ?  ? ?Request for Surgical Clearance   ? ?Procedure:   CATARACT EXTRACTION BY PE, IOL-LEFT THEN RIGHT ? ?Date of Surgery:  Clearance 11/04/21                              ?   ?Surgeon:  DR. Julian Reil ?Surgeon's Group or Practice Name:  [facility] ?Phone number:  [phone] [phone] ?Fax number:  [phone] ATTN: Rinaldo Cloud, NP ?  ?Type of Clearance Requested:   ?- Medical ; PER CLEARANCE REQUEST NO NEED TO HOLD ANY MEDICATIONS INCLUDING ANY BLOOD THINNERS ?  ?Type of Anesthesia:   IV SEDATION ?  ?Additional requests/questions:   ? ?Signed, ?[name]   ?10/26/2021, 2:42 PM  ? ?

## 2021-11-03 ENCOUNTER — Ambulatory Visit (HOSPITAL_COMMUNITY): Payer: Medicare Other | Admitting: Licensed Clinical Social Worker

## 2021-11-04 ENCOUNTER — Other Ambulatory Visit: Payer: Self-pay

## 2021-11-04 ENCOUNTER — Ambulatory Visit (HOSPITAL_COMMUNITY): Payer: Medicare Other | Admitting: Licensed Clinical Social Worker

## 2021-11-17 ENCOUNTER — Other Ambulatory Visit: Payer: Self-pay

## 2021-11-17 ENCOUNTER — Ambulatory Visit (INDEPENDENT_AMBULATORY_CARE_PROVIDER_SITE_OTHER): Payer: Medicare Other | Admitting: Licensed Clinical Social Worker

## 2021-11-17 DIAGNOSIS — F3162 Bipolar disorder, current episode mixed, moderate: Secondary | ICD-10-CM

## 2021-11-22 ENCOUNTER — Encounter (HOSPITAL_COMMUNITY): Payer: Self-pay | Admitting: Licensed Clinical Social Worker

## 2021-11-22 NOTE — Progress Notes (Signed)
Virtual Visit via Telephone Note ? ?I connected with [name] on 11/22/21 at  3:30 PM EDT by telephone and verified that I am speaking with the correct person using two identifiers. ? ?Location: ?Patient: home ?Provider: home office ?  ?I discussed the limitations, risks, security and privacy concerns of performing an evaluation and management service by telephone and the availability of in person appointments. I also discussed with the patient that there may be a patient responsible charge related to this service. The patient expressed understanding and agreed to proceed. ?  ?I discussed the assessment and treatment plan with the patient. The patient was provided an opportunity to ask questions and all were answered. The patient agreed with the plan and demonstrated an understanding of the instructions. ?  ?The patient was advised to call back or seek an in-person evaluation if the symptoms worsen or if the condition fails to improve as anticipated. ? ?I provided 45 minutes of non-face-to-face time during this encounter. ? ? ?[name], LCSW  ? ?THERAPIST PROGRESS NOTE ? ?Session Time: 3:30pm-4:15pm ? ?Participation Level: Active ? ?Behavioral Response: Well GroomedAlertDepressed and Hopeless ? ?Type of Therapy: Individual Therapy ? ?Treatment Goals addressed: "I'm really worried about my sleep issues and how they are effecting my memory and my mind, having more hallucinations, feeling out of it". [name] will improve psychological functioning as evidenced by reduced experience of psychoses, improvement in sleep, and more stabilized mood 3 out of 7 days. ? ?ProgressTowards Goals: Progressing ? ?Interventions: Motivational Interviewing ? ?Summary: HOKE BAER is a 76 y.o. male who presents with Bipolar I Disorder, mixed, moderate.  ? ?Suicidal/Homicidal: Nowithout intent/plan ? ?Therapist Response: [name] engaged well in individual session with Clinician. Clinician utilized MI OARS to reflect and  summarize thoughts and feelings about interactions with son, daughter in law, and grandchildren. Clinician reflected excitement that son came over and he was finally able to see the grandchildren to exchange Christmas gifts. [name] reported he had not seen the grandchildren in several months, despite the fact they live next door to each other. Clinician processed the interaction and identifeid increased hope in their relationship. [name] shared that son will have moments of being nice and supportive, but then he stops cold with no warning. [name] processed experience getting cataract surgery completed and noted that there were no problems with first eye. Next eye to be done next week. Clinician explored ways for [name] to reduce any dependence on son for anything, as he is so unreliable. Clinician noted the value of his friends that have been very close and helpful over the past few years.  ? ?Plan: Return again in 2 weeks. ? ?Diagnosis: Bipolar 1 disorder, mixed, moderate ([place]) ? ?Collaboration of Care: Other none required in this session.  ? ?Patient/Guardian was advised Release of Information must be obtained prior to any record release in order to collaborate their care with an outside provider. Patient/Guardian was advised if they have not already done so to contact the registration department to sign all necessary forms in order for [place] to release information regarding their care.  ? ?Consent: Patient/Guardian gives verbal consent for treatment and assignment of benefits for services provided during this visit. Patient/Guardian expressed understanding and agreed to proceed.  ? ?[name], LCSW ?11/22/2021 ? ?

## 2021-12-08 ENCOUNTER — Ambulatory Visit (HOSPITAL_COMMUNITY): Payer: Medicare Other | Admitting: Licensed Clinical Social Worker

## 2021-12-10 ENCOUNTER — Telehealth (HOSPITAL_COMMUNITY): Payer: Medicare Other | Admitting: Psychiatry

## 2021-12-15 ENCOUNTER — Institutional Professional Consult (permissible substitution): Payer: Medicare Other | Admitting: Cardiology

## 2021-12-15 ENCOUNTER — Telehealth (HOSPITAL_COMMUNITY): Payer: Medicare Other | Admitting: Psychiatry

## 2021-12-15 NOTE — Progress Notes (Deleted)
Electrophysiology Office Note:    Date:  12/15/2021   ID:  Justin [name], DOB [birth year], MRN [id number]  PCP:  Justin Borg, MD  [facility] Cardiologist:  Sherren Mocha, MD  [facility] Electrophysiologist:  None   Referring MD: Oliver Barre, [name]   Chief Complaint: Atrial fibrillation  History of Present Illness:    Justin [name] is a 76 y.o. male who presents for an evaluation of atrial fibrillation at the request of Justin Peals, PA-C.  He last saw Justin [name] on August 18, 2021.  He was initially diagnosed with atrial fibrillation in September 2022.  He does feel palpitations and being "swimmy headed" when he is in atrial fibrillation.  He also reported shortness of breath and fatigue.  He takes Eliquis for CHA2DS2-VASc score of 4.  He had a cardioversion in December 2022 but was back in atrial fibrillation by the 28th of that month.  He presents today to discuss treatment options for his atrial fibrillation.     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[place])    Grief    History of echocardiogram    Echo 3/19: Mild concentric LVH, EF 60-65, normal wall motion, grade 1 diastolic dysfunction, mild AI, mildly dilated aortic root (40 mm), MAC, mild LAE, atrial septal lipomatous hypertrophy // Echo 8/22: EF 55-60, no RWMA, mild LVH, GRII DD, normal RVSF, RVSP 40, mild LAE, trivial MR, mild-moderate AI, AV sclerosis without stenosis, mild dilation of aortic root (39 mm)   History of non-ST elevation  myocardial infarction (NSTEMI)    History of nuclear stress test    Nuclear stress test 3/19: EF 57, inferior/inferoseptal/inferolateral defect consistent with probable soft tissue attenuation (cannot exclude subendocardial scar), no ischemia, intermediate risk >> Echo 3/19 normal EF, normal wall motion   History of pneumonia 2011   History of prostatitis 1990s   Hx of adenomatous colonic polyps 06/22/2016   Hyperlipidemia    Hypertension    Migraines    prior history   PTSD (post-traumatic stress disorder)     Past Surgical History:  Procedure Laterality Date   CARDIOVERSION N/A 08/05/2021   Procedure: CARDIOVERSION;  Surgeon: Acie Fredrickson Wonda Cheng, MD;  Location: [facility] ENDOSCOPY;  Service: Cardiovascular;  Laterality: N/A;   CERVICAL DISCECTOMY     COLONOSCOPY  03/2017   ELBOW SURGERY Left    ELBOW SURGERY Right    INGUINAL HERNIA REPAIR Bilateral    1996, 1997   KNEE ARTHROSCOPY Right    KNEE CARTILAGE SURGERY Left    NASAL SEPTUM SURGERY     STENT PLACEMENT VASCULAR ([place] [facility])  09/02/2010   TONSILLECTOMY     TOTAL KNEE ARTHROPLASTY Left 08/31/2018   Procedure: TOTAL KNEE ARTHROPLASTY;  Surgeon: Dorna Leitz, MD;  Location: [facility] ORS;  Service: Orthopedics;  Laterality: Left;    Current Medications: No outpatient  medications have been marked as taking for the 12/15/21 encounter (Appointment) with Vickie Epley, MD.     Allergies:   Ambien [zolpidem tartrate], Codeine, Ativan [lorazepam], and Seroquel [quetiapine]   Social History   Socioeconomic History   Marital status: Widowed    Spouse name: Not on file   Number of children: 2   Years of education: 58   Highest education level: Not on file  Occupational History   Occupation: Retired  Tobacco Use   Smoking status: Never   Smokeless tobacco: Never  Vaping Use   Vaping Use: Never used  Substance and Sexual Activity   Alcohol use: Not Currently    Alcohol/week: 6.0 standard drinks    Types: 6 Cans of beer per week     Comment: 3 beers in a sitting   Drug use: No   Sexual activity: Not Currently  Other Topics Concern   Not on file  Social History Narrative   Retired, widowed in 2017    1 son / 1 daughter   2 caffeinated beverages daily no alcohol or tobacco   Fun: Work out in the yard.   Denies religious beliefs effecting healthcare.    Worked at [employer] for 30 years   Social Determinants of Radio broadcast assistant Strain: Not on [employer] Insecurity: Not on file  Transportation Needs: Not on file  Physical Activity: Not on file  Stress: Not on file  Social Connections: Not on file     Family History: The patient's family history includes Alzheimer's disease in his mother; Arthritis in his father and mother; Depression in his brother; Diabetes in his sister and sister; Heart attack in his father, mother, and sister; Heart disease in his father and mother; Hyperlipidemia in his father; Hypertension in his father and mother; Lung cancer in his maternal aunt, paternal aunt, and paternal grandmother; Multiple sclerosis in his sister; Pulmonary embolism in his sister; Stroke in his father.  ROS:   Please see the history of present illness.    All other systems reviewed and are negative.  EKGs/Labs/Other Studies Reviewed:    The following studies were reviewed today:  May 27, 2021 heart monitor 100% A-fib Average heart rate 79 beats a minute     Recent Labs: 10/12/2021: ALT 19; BUN 20; Creatinine, Ser 0.88; Hemoglobin 13.5; Platelets 215.0; Potassium 4.5; Sodium 132; TSH 0.63  Recent Lipid Panel    Component Value Date/Time   CHOL 137 10/12/2021 1527   TRIG 44.0 10/12/2021 1527   HDL 77.80 10/12/2021 1527   CHOLHDL 2 10/12/2021 1527   VLDL 8.8 10/12/2021 1527   LDLCALC 50 10/12/2021 1527    Physical Exam:    VS:  There were no vitals taken for this visit.    Wt Readings from Last 3 Encounters:  10/12/21 170 lb (77.1 kg)  08/18/21 166 lb 6.4 oz (75.5 kg)  08/05/21 164  lb (74.4 kg)     GEN: *** Well nourished, well developed in no acute distress HEENT: Normal NECK: No JVD; No carotid bruits LYMPHATICS: No lymphadenopathy CARDIAC: Irregularly irregular, no murmurs, rubs, gallops RESPIRATORY:  Clear to auscultation without rales, wheezing or rhonchi  ABDOMEN: Soft, non-tender, non-distended MUSCULOSKELETAL:  No edema; No deformity  SKIN: Warm and dry NEUROLOGIC:  Alert and oriented x 3 PSYCHIATRIC:  Normal affect       ASSESSMENT:    No diagnosis found. PLAN:    In order of problems listed above:  Total time spent with patient today *** minutes. This includes reviewing records, evaluating the patient and coordinating care.  Medication Adjustments/Labs and Tests Ordered: Current medicines are reviewed at length with the patient today.  Concerns regarding medicines are outlined above.  No orders of the defined types were placed in this encounter.  No orders of the defined types were placed in this encounter.    Signed, Hilton Cork. Quentin Ore, MD, [facility], [facility] 12/15/2021 7:54 AM    Electrophysiology [facility]

## 2021-12-16 ENCOUNTER — Encounter (HOSPITAL_COMMUNITY): Payer: Self-pay | Admitting: Psychiatry

## 2021-12-16 ENCOUNTER — Telehealth (HOSPITAL_BASED_OUTPATIENT_CLINIC_OR_DEPARTMENT_OTHER): Payer: Medicare Other | Admitting: Psychiatry

## 2021-12-16 VITALS — Wt 174.0 lb

## 2021-12-16 DIAGNOSIS — F419 Anxiety disorder, unspecified: Secondary | ICD-10-CM

## 2021-12-16 DIAGNOSIS — F4310 Post-traumatic stress disorder, unspecified: Secondary | ICD-10-CM

## 2021-12-16 DIAGNOSIS — F3162 Bipolar disorder, current episode mixed, moderate: Secondary | ICD-10-CM

## 2021-12-16 MED ORDER — LAMOTRIGINE 25 MG PO TABS: ORAL_TABLET | ORAL | 2 refills | Status: DC

## 2021-12-16 MED ORDER — LAMOTRIGINE 200 MG PO TABS: 200.0000 mg | ORAL_TABLET | Freq: Every day | ORAL | 2 refills | Status: DC

## 2021-12-16 NOTE — Progress Notes (Signed)
Virtual Visit via Telephone Note ? ?I connected with Justin [name] on 12/16/21 at 10:00 AM EDT by telephone and verified that I am speaking with the correct person using two identifiers. ? ?Location: ?Patient: Home ?Provider: Home Office ?  ?I discussed the limitations, risks, security and privacy concerns of performing an evaluation and management service by telephone and the availability of in person appointments. I also discussed with the patient that there may be a patient responsible charge related to this service. The patient expressed understanding and agreed to proceed. ? ? ?History of Present Illness: ?Patient is evaluated by phone session.  He reported chronic health issues which is gradually worsening.  He is very concerned about his right foot pain and numbness.  He is upset with his physician because he had surgery 8 months ago but has not seen a significant improvement.  Sometime he has insomnia because of the pain.  Now he is thinking to ask his PCP to get local pain management referral.  He had eye surgery on his left eye and his son helped but he refused to get help from him on his right eye surgery.  He has to ask from his neighbor.  He is not happy with his son.  He admitted irritability and mood swings.  He has occasionally nightmares and flashback but denies any mania.  He only takes the Lamictal which we have increased on the last visit and he noticed improvement in his mood swings but cannot afford Abilify.  He denies any hallucination, paranoia, suicidal thoughts.  He decided not to go for cardiac ablation because his son refused to help him to stay in the hospital for a few hours after the procedure.  He is in therapy with Janett Billow.  Recently he had blood work.  His labs are okay.  His hemoglobin A1c is 4.6.  His appetite is fair.  His energy level is fair.  His weight is unchanged from the past.  His PCP prescribed Xanax which he takes at bedtime to go to sleep.  He has no rash or any  itching. ? ? ?Past Psychiatric History: Reviewed. ?H/O suicidal attempt by overdose on Ambien twice, hydroxyzine and alcohol. H/O bipolar disorder with multiple inpatient.  Did IOP. Last ER visit Sept 2020 and inpatient October 2018 at [facility]. Tried Zyprexa, Cymbalta, Seroquel, Vistaril, Neurontin, Wellbutrin and triptal. Tried low-dose doxepin and trazodone. Had a reaction with Ativan and pain medication.  Saw Dr. Letta Moynahan in past. ? ?Recent Results (from the past 2160 hour(s))  ?VITAMIN D 25 Hydroxy (Vit-D Deficiency, Fractures)     Status: None  ? Collection Time: 10/12/21  3:27 PM  ?Result Value Ref Range  ? VITD 38.10 30.00 - 100.00 ng/mL  ?Vitamin B12     Status: None  ? Collection Time: 10/12/21  3:27 PM  ?Result Value Ref Range  ? Vitamin B-12 304 211 - 911 pg/mL  ?Basic metabolic panel     Status: Abnormal  ? Collection Time: 10/12/21  3:27 PM  ?Result Value Ref Range  ? Sodium 132 (L) 135 - 145 mEq/L  ? Potassium 4.5 3.5 - 5.1 mEq/L  ? Chloride 99 96 - 112 mEq/L  ? CO2 29 19 - 32 mEq/L  ? Glucose, Bld 104 (H) 70 - 99 mg/dL  ? BUN 20 6 - 23 mg/dL  ? Creatinine, Ser 0.88 0.40 - 1.50 mg/dL  ? GFR 84.18 >60.00 mL/min  ?  Comment: Calculated using the CKD-EPI Creatinine Equation (2021)  ?  Calcium 9.6 8.4 - 10.5 mg/dL  ?PSA     Status: None  ? Collection Time: 10/12/21  3:27 PM  ?Result Value Ref Range  ? PSA 2.17 0.10 - 4.00 ng/mL  ?  Comment: Test performed using Access Hybritech PSA Assay, a parmagnetic partical, chemiluminecent immunoassay.  ?Urinalysis, Routine w reflex microscopic     Status: Abnormal  ? Collection Time: 10/12/21  3:27 PM  ?Result Value Ref Range  ? Color, Urine YELLOW Yellow;Lt. Yellow;Straw;Dark Yellow;Amber;Green;Red;Brown  ? APPearance CLEAR Clear;Turbid;Slightly Cloudy;Cloudy  ? Specific Gravity, Urine 1.020 1.000 - 1.030  ? pH 6.5 5.0 - 8.0  ? Total Protein, Urine NEGATIVE Negative  ? Urine Glucose NEGATIVE Negative  ? Ketones, ur TRACE (A) Negative  ? Bilirubin Urine NEGATIVE  Negative  ? Hgb urine dipstick NEGATIVE Negative  ? Urobilinogen, UA 0.2 0.0 - 1.0  ? Leukocytes,Ua NEGATIVE Negative  ? Nitrite NEGATIVE Negative  ? WBC, UA 0-2/hpf 0-2/hpf  ? RBC / HPF 0-2/hpf 0-2/hpf  ? Squamous Epithelial / LPF Rare(0-4/hpf) Rare(0-4/hpf)  ?TSH     Status: None  ? Collection Time: 10/12/21  3:27 PM  ?Result Value Ref Range  ? TSH 0.63 0.35 - 5.50 uIU/mL  ?CBC with Differential/Platelet     Status: Abnormal  ? Collection Time: 10/12/21  3:27 PM  ?Result Value Ref Range  ? WBC 4.6 4.0 - 10.5 K/uL  ? RBC 4.18 (L) 4.22 - 5.81 Mil/uL  ? Hemoglobin 13.5 13.0 - 17.0 g/dL  ? HCT 40.7 39.0 - 52.0 %  ? MCV 97.5 78.0 - 100.0 fl  ? MCHC 33.0 30.0 - 36.0 g/dL  ? RDW 13.8 11.5 - 15.5 %  ? Platelets 215.0 150.0 - 400.0 K/uL  ? Neutrophils Relative % 70.3 43.0 - 77.0 %  ? Lymphocytes Relative 17.2 12.0 - 46.0 %  ? Monocytes Relative 10.8 3.0 - 12.0 %  ? Eosinophils Relative 0.9 0.0 - 5.0 %  ? Basophils Relative 0.8 0.0 - 3.0 %  ? Neutro Abs 3.3 1.4 - 7.7 K/uL  ? Lymphs Abs 0.8 0.7 - 4.0 K/uL  ? Monocytes Absolute 0.5 0.1 - 1.0 K/uL  ? Eosinophils Absolute 0.0 0.0 - 0.7 K/uL  ? Basophils Absolute 0.0 0.0 - 0.1 K/uL  ?Hepatic function panel     Status: None  ? Collection Time: 10/12/21  3:27 PM  ?Result Value Ref Range  ? Total Bilirubin 1.0 0.2 - 1.2 mg/dL  ? Bilirubin, Direct 0.2 0.0 - 0.3 mg/dL  ? Alkaline Phosphatase 66 39 - 117 U/L  ? AST 25 0 - 37 U/L  ? ALT 19 0 - 53 U/L  ? Total Protein 6.8 6.0 - 8.3 g/dL  ? Albumin 4.7 3.5 - 5.2 g/dL  ?Lipid panel     Status: None  ? Collection Time: 10/12/21  3:27 PM  ?Result Value Ref Range  ? Cholesterol 137 0 - 200 mg/dL  ?  Comment: ATP III Classification       Desirable:  < 200 mg/dL               Borderline High:  200 - 239 mg/dL          High:  > = 240 mg/dL  ? Triglycerides 44.0 0.0 - 149.0 mg/dL  ?  Comment: Normal:  <150 mg/dLBorderline High:  150 - 199 mg/dL  ? HDL 77.80 >39.00 mg/dL  ? VLDL 8.8 0.0 - 40.0 mg/dL  ? LDL Cholesterol 50 0 - 99 mg/dL  ?  Total  CHOL/HDL Ratio 2   ?  Comment:                Men          Women1/2 Average Risk     3.4          3.3Average Risk          5.0          4.42X Average Risk          9.6          7.13X Average Risk          15.0          11.0                      ? NonHDL 59.26   ?  Comment: NOTE:  Non-HDL goal should be 30 mg/dL higher than patient's LDL goal (i.e. LDL goal of < 70 mg/dL, would have non-HDL goal of < 100 mg/dL)  ?Hemoglobin A1c     Status: None  ? Collection Time: 10/12/21  3:27 PM  ?Result Value Ref Range  ? Hgb A1c MFr Bld 4.6 4.6 - 6.5 %  ?  Comment: Glycemic Control Guidelines for People with Diabetes:Non Diabetic:  <6%Goal of Therapy: <7%Additional Action Suggested:  >8%   ?Microalbumin / creatinine urine ratio     Status: None  ? Collection Time: 10/12/21  3:27 PM  ?Result Value Ref Range  ? Microalb, Ur 1.0 0.0 - 1.9 mg/dL  ? Creatinine,U 119.0 mg/dL  ? Microalb Creat Ratio 0.8 0.0 - 30.0 mg/g  ?  ? ?Psychiatric Specialty Exam: ?Physical Exam  ?Review of Systems  ?Musculoskeletal:   ?     Right foot pain  ?Neurological:  Positive for numbness.   ?Weight 174 lb (78.9 kg).There is no height or weight on file to calculate BMI.  ?General Appearance: NA  ?Eye Contact:  NA  ?Speech:  Clear and Coherent  ?Volume:  Decreased  ?Mood:  Dysphoric and Irritable  ?Affect:  NA  ?Thought Process:  Descriptions of Associations: Intact  ?Orientation:  Full (Time, Place, and Person)  ?Thought Content:  Rumination  ?Suicidal Thoughts:  No  ?Homicidal Thoughts:  No  ?Memory:  Immediate;   Fair ?Recent;   Fair ?Remote;   Fair  ?Judgement:  Intact  ?Insight:  Shallow  ?Psychomotor Activity:  NA  ?Concentration:  Concentration: Fair and Attention Span: Fair  ?Recall:  Good  ?Fund of Knowledge:  Good  ?Language:  Good  ?Akathisia:  No  ?Handed:  Right  ?AIMS (if indicated):     ?Assets:  Communication Skills ?Desire for Improvement ?Housing  ?ADL's:  Intact  ?Cognition:  WNL  ?Sleep:   fair  ? ? ? ? ?Assessment and Plan: ?PTSD.   Bipolar disorder type I.  Anxiety. ? ?Discussed psychosocial stressors with his chronic but lately worsening because son refused to help him and he decided not to go for cardiac ablation.  Cannot afford Abilify bu

## 2021-12-20 ENCOUNTER — Ambulatory Visit: Payer: Medicare Other

## 2021-12-22 ENCOUNTER — Ambulatory Visit (HOSPITAL_COMMUNITY): Payer: Medicare Other | Admitting: Licensed Clinical Social Worker

## 2021-12-23 ENCOUNTER — Ambulatory Visit (INDEPENDENT_AMBULATORY_CARE_PROVIDER_SITE_OTHER): Payer: Medicare Other | Admitting: *Deleted

## 2021-12-23 DIAGNOSIS — Z0000 Encounter for general adult medical examination without abnormal findings: Secondary | ICD-10-CM

## 2021-12-23 DIAGNOSIS — Z1211 Encounter for screening for malignant neoplasm of colon: Secondary | ICD-10-CM | POA: Diagnosis not present

## 2021-12-23 NOTE — Patient Instructions (Signed)
Justin [name] , ?Thank you for taking time to come for your Medicare Wellness Visit. I appreciate your ongoing commitment to your health goals. Please review the following plan we discussed and let me know if I can assist you in the future.  ? ?Screening recommendations/referrals: ?Colonoscopy: Education provided ?Recommended yearly ophthalmology/optometry visit for glaucoma screening and checkup ?Recommended yearly dental visit for hygiene and checkup ? ?Vaccinations: ?Influenza vaccine: up to date ?Pneumococcal vaccine: up to date ?Tdap vaccine: up to date ?Shingles vaccine: up to date   ? ?Advanced directives: Education provided  ? ?Conditions/risks identified:  ? ? ? ?Preventive Care 57 Years and Older, Male ?Preventive care refers to lifestyle choices and visits with your health care provider that can promote health and wellness. ?What does preventive care include? ?A yearly physical exam. This is also called an annual well check. ?Dental exams once or twice a year. ?Routine eye exams. Ask your health care provider how often you should have your eyes checked. ?Personal lifestyle choices, including: ?Daily care of your teeth and gums. ?Regular physical activity. ?Eating a healthy diet. ?Avoiding tobacco and drug use. ?Limiting alcohol use. ?Practicing safe sex. ?Taking low doses of aspirin every day. ?Taking vitamin and mineral supplements as recommended by your health care provider. ?What happens during an annual well check? ?The services and screenings done by your health care provider during your annual well check will depend on your age, overall health, lifestyle risk factors, and family history of disease. ?Counseling  ?Your health care provider may ask you questions about your: ?Alcohol use. ?Tobacco use. ?Drug use. ?Emotional well-being. ?Home and relationship well-being. ?Sexual activity. ?Eating habits. ?History of falls. ?Memory and ability to understand (cognition). ?Work and work Statistician. ?Screening   ?You may have the following tests or measurements: ?Height, weight, and BMI. ?Blood pressure. ?Lipid and cholesterol levels. These may be checked every 5 years, or more frequently if you are over 34 years old. ?Skin check. ?Lung cancer screening. You may have this screening every year starting at age 78 if you have a 30-pack-year history of smoking and currently smoke or have quit within the past 15 years. ?Fecal occult blood test (FOBT) of the stool. You may have this test every year starting at age 37. ?Flexible sigmoidoscopy or colonoscopy. You may have a sigmoidoscopy every 5 years or a colonoscopy every 10 years starting at age 27. ?Prostate cancer screening. Recommendations will vary depending on your family history and other risks. ?Hepatitis C blood test. ?Hepatitis B blood test. ?Sexually transmitted disease (STD) testing. ?Diabetes screening. This is done by checking your blood sugar (glucose) after you have not eaten for a while (fasting). You may have this done every 1-3 years. ?Abdominal aortic aneurysm (AAA) screening. You may need this if you are a current or former smoker. ?Osteoporosis. You may be screened starting at age 44 if you are at high risk. ?Talk with your health care provider about your test results, treatment options, and if necessary, the need for more tests. ?Vaccines  ?Your health care provider may recommend certain vaccines, such as: ?Influenza vaccine. This is recommended every year. ?Tetanus, diphtheria, and acellular pertussis (Tdap, Td) vaccine. You may need a Td booster every 10 years. ?Zoster vaccine. You may need this after age 3. ?Pneumococcal 13-valent conjugate (PCV13) vaccine. One dose is recommended after age 84. ?Pneumococcal polysaccharide (PPSV23) vaccine. One dose is recommended after age 44. ?Talk to your health care provider about which screenings and vaccines you need and how  often you need them. ?This information is not intended to replace advice given to you by  your health care provider. Make sure you discuss any questions you have with your health care provider. ?Document Released: 09/04/2015 Document Revised: 04/27/2016 Document Reviewed: 06/09/2015 ?Elsevier Interactive Patient Education ? 2017 [employer]. ? ?Fall Prevention in the Home ?Falls can cause injuries. They can happen to people of all ages. There are many things you can do to make your home safe and to help prevent falls. ?What can I do on the outside of my home? ?Regularly fix the edges of walkways and driveways and fix any cracks. ?Remove anything that might make you trip as you walk through a door, such as a raised step or threshold. ?Trim any bushes or trees on the path to your home. ?Use bright outdoor lighting. ?Clear any walking paths of anything that might make someone trip, such as rocks or tools. ?Regularly check to see if handrails are loose or broken. Make sure that both sides of any steps have handrails. ?Any raised decks and porches should have guardrails on the edges. ?Have any leaves, snow, or ice cleared regularly. ?Use sand or salt on walking paths during winter. ?Clean up any spills in your garage right away. This includes oil or grease spills. ?What can I do in the bathroom? ?Use night lights. ?Install grab bars by the toilet and in the tub and shower. Do not use towel bars as grab bars. ?Use non-skid mats or decals in the tub or shower. ?If you need to sit down in the shower, use a plastic, non-slip stool. ?Keep the floor dry. Clean up any water that spills on the floor as soon as it happens. ?Remove soap buildup in the tub or shower regularly. ?Attach bath mats securely with double-sided non-slip rug tape. ?Do not have throw rugs and other things on the floor that can make you trip. ?What can I do in the bedroom? ?Use night lights. ?Make sure that you have a light by your bed that is easy to reach. ?Do not use any sheets or blankets that are too big for your bed. They should not hang  down onto the floor. ?Have a firm chair that has side arms. You can use this for support while you get dressed. ?Do not have throw rugs and other things on the floor that can make you trip. ?What can I do in the kitchen? ?Clean up any spills right away. ?Avoid walking on wet floors. ?Keep items that you use a lot in easy-to-reach places. ?If you need to reach something above you, use a strong step stool that has a grab bar. ?Keep electrical cords out of the way. ?Do not use floor polish or wax that makes floors slippery. If you must use wax, use non-skid floor wax. ?Do not have throw rugs and other things on the floor that can make you trip. ?What can I do with my stairs? ?Do not leave any items on the stairs. ?Make sure that there are handrails on both sides of the stairs and use them. Fix handrails that are broken or loose. Make sure that handrails are as long as the stairways. ?Check any carpeting to make sure that it is firmly attached to the stairs. Fix any carpet that is loose or worn. ?Avoid having throw rugs at the top or bottom of the stairs. If you do have throw rugs, attach them to the floor with carpet tape. ?Make sure that you have a  light switch at the top of the stairs and the bottom of the stairs. If you do not have them, ask someone to add them for you. ?What else can I do to help prevent falls? ?Wear shoes that: ?Do not have high heels. ?Have rubber bottoms. ?Are comfortable and fit you well. ?Are closed at the toe. Do not wear sandals. ?If you use a stepladder: ?Make sure that it is fully opened. Do not climb a closed stepladder. ?Make sure that both sides of the stepladder are locked into place. ?Ask someone to hold it for you, if possible. ?Clearly mark and make sure that you can see: ?Any grab bars or handrails. ?First and last steps. ?Where the edge of each step is. ?Use tools that help you move around (mobility aids) if they are needed. These  include: ?Canes. ?Walkers. ?Scooters. ?Crutches. ?Turn on the lights when you go into a dark area. Replace any light bulbs as soon as they burn out. ?Set up your furniture so you have a clear path. Avoid moving your furniture around. ?If any of your fl

## 2021-12-23 NOTE — Progress Notes (Signed)
? ?Subjective:  ? Justin [name] is a 76 y.o. male who presents for Medicare Annual/Subsequent preventive examination. ? ?I connected with  [name] on 12/23/21 by a telephone enabled telemedicine application and verified that I am speaking with the correct person using two identifiers. ?  ?I discussed the limitations of evaluation and management by telemedicine. The patient expressed understanding and agreed to proceed. ? ?Patient location: home ? ?Provider location:  Tele-Health -Home ? ? ? ?Review of Systems    ? ?Cardiac Risk Factors include: advanced age (>54mn, >>28women);male gender;family history of premature cardiovascular disease ? ?   ?Objective:  ?  ?Today's Vitals  ? 12/23/21 01610 ?PainSc: 6   ? ?There is no height or weight on file to calculate BMI. ? ? ?  12/23/2021  ?  8:38 AM 08/05/2021  ?  7:02 AM 09/02/2020  ?  8:31 PM 08/27/2019  ?  6:49 PM 05/20/2019  ?  4:22 PM 08/31/2018  ? 10:20 AM 08/21/2018  ?  2:07 PM  ?Advanced Directives  ?Does Patient Have a Medical Advance Directive? No Yes No No Yes Yes Yes  ?Type of Advance Directive  Living will;Healthcare Power of Attorney   Living will Living will Living will  ?Does patient want to make changes to medical advance directive?     No - Patient declined No - Patient declined No - Patient declined  ?Copy of [place]?  No - copy requested       ?Would patient like information on creating a medical advance directive? No - Patient declined  No - Patient declined Yes (ED - Information included in AVS) No - Patient declined    ? ? ?Current Medications (verified) ?Outpatient Encounter Medications as of 12/23/2021  ?Medication Sig  ? ALPRAZolam (XANAX) 1 MG tablet TAKE 1/2 TO 1 TABLET BY MOUTH ONCE DAILY  ? apixaban (ELIQUIS) 5 MG TABS tablet Take 1 tablet (5 mg total) by mouth 2 (two) times daily.  ? diltiazem (CARDIZEM CD) 180 MG 24 hr capsule TAKE 1 CAPSULE BY MOUTH DAILY  ? gabapentin (NEURONTIN) 300 MG capsule Take 300 mg by  mouth 3 (three) times daily.  ? lamoTRIgine (LAMICTAL) 200 MG tablet Take 1 tablet (200 mg total) by mouth daily. For mood control  ? lamoTRIgine (LAMICTAL) 25 MG tablet Take one tab twice daily with 200 mg daily Lamictal.  ? meloxicam (MOBIC) 15 MG tablet Take 15 mg by mouth daily.  ? nitroGLYCERIN (NITROSTAT) 0.4 MG SL tablet Place 1 tablet (0.4 mg total) under the tongue every 5 (five) minutes as needed for chest pain.  ? rosuvastatin (CRESTOR) 20 MG tablet One tablet by mouth ( 20 mg) daily.  ? isosorbide mononitrate (IMDUR) 30 MG 24 hr tablet Take 1 tablet (30 mg total) by mouth daily.  ? ?No facility-administered encounter medications on file as of 12/23/2021.  ? ? ?Allergies (verified) ?Ambien [zolpidem tartrate], Codeine, Ativan [lorazepam], and Seroquel [quetiapine]  ? ?History: ?Past Medical History:  ?Diagnosis Date  ? Anxiety and depression   ? Arthritis   ? knees, c-spine // gets lumbar ESI q 6 mos  ? B[place] disorder ([place]   ? CAD (coronary artery disease)   ? [place] >> LHC ([facility]) - mLAD 70, Dx 65 >> PCI:  3x15 mm Endeavor DES to mLAD and 2.5x12 mm Endeavor DES to Dx // S/p NSTEMI >> LHC 8/12 ([facility]) - LM ok, LAD prox and mid  40; LAD stent ok, Dx stent ok, mRCA 30, mLCx 50 >> med Rx // ETT-Echo 2/17 ([facility]): Normal, EF 55-60 at rest  ? Chicken pox   ? 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 Grief   ? History of echocardiogram   ? Echo 3/19: Mild concentric LVH, EF 60-65, normal wall motion, grade 1 diastolic dysfunction, mild AI, mildly dilated aortic root (40 mm), MAC, mild LAE, atrial septal lipomatous hypertrophy // Echo 8/22: EF 55-60, no RWMA, mild LVH, GRII DD, normal RVSF, RVSP 40, mild LAE, trivial MR, mild-moderate AI, AV sclerosis without stenosis, mild dilation of aortic root (39 mm)  ? History of non-ST elevation myocardial infarction (NSTEMI)   ? History of nuclear stress test   ? Nuclear stress test 3/19: EF 57, inferior/inferoseptal/inferolateral defect consistent with  probable soft tissue attenuation (cannot exclude subendocardial scar), no ischemia, intermediate risk >> Echo 3/19 normal EF, normal wall motion  ? History of pneumonia 2011  ? History of prostatitis 1990s  ? Hx of adenomatous colonic polyps 06/22/2016  ? Hyperlipidemia   ? Hypertension   ? Migraines   ? prior history  ? PTSD (post-traumatic stress disorder)   ? ?Past Surgical History:  ?Procedure Laterality Date  ? CARDIOVERSION N/A 08/05/2021  ? Procedure: CARDIOVERSION;  Surgeon: Thayer Headings, MD;  Location: [facility] ENDOSCOPY;  Service: Cardiovascular;  Laterality: N/A;  ? CERVICAL DISCECTOMY    ? COLONOSCOPY  03/2017  ? ELBOW SURGERY Left   ? ELBOW SURGERY Right   ? INGUINAL HERNIA REPAIR Bilateral   ? 1996, 1997  ? KNEE ARTHROSCOPY Right   ? KNEE CARTILAGE SURGERY Left   ? NASAL SEPTUM SURGERY    ? STENT PLACEMENT VASCULAR ([facility])  09/02/2010  ? TONSILLECTOMY    ? TOTAL KNEE ARTHROPLASTY Left 08/31/2018  ? Procedure: TOTAL KNEE ARTHROPLASTY;  Surgeon: Dorna Leitz, MD;  Location: [facility] ORS;  Service: Orthopedics;  Laterality: Left;  ? ?Family History  ?Problem Relation Age of Onset  ? Arthritis Mother   ? Heart disease Mother   ?     s/p CABG  ? Hypertension Mother   ? Alzheimer's disease Mother   ? Heart attack Mother   ? Arthritis Father   ? Hyperlipidemia Father   ? Heart disease Father   ?     s/p CABG  ? Stroke Father   ? Hypertension Father   ? Heart attack Father   ? Multiple sclerosis Sister   ? Diabetes Sister   ? Lung cancer Paternal Grandmother   ? Diabetes Sister   ? Heart attack Sister   ? Pulmonary embolism Sister   ?     died  ? Lung cancer Maternal Aunt   ? Lung cancer Paternal Aunt   ? Depression Brother   ?     suicide  ? ?Social History  ? ?Socioeconomic History  ? Marital status: Widowed  ?  Spouse name: Not on file  ? Number of children: 2  ? Years of education: 34  ? Highest education level: Not on file  ?Occupational History  ? Occupation: Retired  ?Tobacco Use  ? Smoking status: Never  ?  Smokeless tobacco: Never  ?Vaping Use  ? Vaping Use: Never used  ?Substance and Sexual Activity  ? Alcohol use: Not Currently  ?  Alcohol/week: 6.0 standard drinks  ?  Types: 6 Cans of beer per week  ?  Comment: 3 beers in a sitting  ? Drug use: No  ?  Sexual activity: Not Currently  ?Other Topics Concern  ? Not on file  ?Social History Narrative  ? Retired, widowed in 2017   ? 1 son / 1 daughter  ? 2 caffeinated beverages daily no alcohol or tobacco  ? Fun: Work out in the yard.  ? Denies religious beliefs effecting healthcare.   ? Worked at [employer] for 30 years  ? ?Social Determinants of Health  ? ?Financial Resource Strain: Low Risk   ? Difficulty of Paying Living Expenses: Not hard at all  ?Food Insecurity: No Food Insecurity  ? Worried About Charity fundraiser in the Last Year: Never true  ? Ran Out of Food in the Last Year: Never true  ?Transportation Needs: No Transportation Needs  ? Lack of Transportation (Medical): No  ? Lack of Transportation (Non-Medical): No  ?Physical Activity: Inactive  ? Days of Exercise per Week: 0 days  ? Minutes of Exercise per Session: 0 min  ?Stress: Stress Concern Present  ? Feeling of Stress : To some extent  ?Social Connections: Socially Isolated  ? Frequency of Communication with Friends and Family: More than three times a week  ? Frequency of Social Gatherings with Friends and Family: Once a week  ? Attends Religious Services: Never  ? Active Member of Clubs or Organizations: No  ? Attends Archivist Meetings: Never  ? Marital Status: Widowed  ? ? ?Tobacco Counseling ?Counseling given: Not Answered ? ? ?Clinical Intake: ? ?Pre-visit preparation completed: Yes ? ?Pain : 0-10 ?Pain Score: 6  ?Pain Type: Chronic pain ?Pain Location: Ankle ?Pain Orientation: Right ?Pain Descriptors / Indicators: Constant, Burning, Aching ?Pain Onset: More than a month ago ?Pain Frequency: Constant ?Effect of Pain on Daily Activities: yes ? ?  ? ?Nutritional Risks: None ?Diabetes:  No ? ?How often do you need to have someone help you when you read instructions, pamphlets, or other written materials from your doctor or pharmacy?: 1 - Never ? ?Diabetic?  no ? ?Interpreter Needed?: No ? ?Info

## 2021-12-27 ENCOUNTER — Ambulatory Visit: Payer: Medicare Other | Admitting: Internal Medicine

## 2021-12-29 ENCOUNTER — Ambulatory Visit: Payer: Medicare Other | Admitting: Internal Medicine

## 2022-01-05 ENCOUNTER — Ambulatory Visit (INDEPENDENT_AMBULATORY_CARE_PROVIDER_SITE_OTHER): Payer: Medicare Other | Admitting: Licensed Clinical Social Worker

## 2022-01-05 DIAGNOSIS — F3162 Bipolar disorder, current episode mixed, moderate: Secondary | ICD-10-CM

## 2022-01-06 ENCOUNTER — Encounter (HOSPITAL_COMMUNITY): Payer: Self-pay | Admitting: Licensed Clinical Social Worker

## 2022-01-06 NOTE — Progress Notes (Signed)
Virtual Visit via Telephone Note  I connected with Justin [name] on 01/06/22 at  1:30 PM EDT by telephone and verified that I am speaking with the correct person using two identifiers.  Location: Patient: home Provider: home office   I discussed the limitations, risks, security and privacy concerns of performing an evaluation and management service by telephone and the availability of in person appointments. I also discussed with the patient that there may be a patient responsible charge related to this service. The patient expressed understanding and agreed to proceed.    I discussed the assessment and treatment plan with the patient. The patient was provided an opportunity to ask questions and all were answered. The patient agreed with the plan and demonstrated an understanding of the instructions.   The patient was advised to call back or seek an in-person evaluation if the symptoms worsen or if the condition fails to improve as anticipated.  I provided 45 minutes of non-face-to-face time during this encounter.   [name], LCSW   THERAPIST PROGRESS NOTE  Session Time: 1:30pm-2:20pm  Participation Level: Active  Behavioral Response: NAAlertDepressed and Hopeless  Type of Therapy: Individual Therapy  Treatment Goals addressed: I'm really worried about my sleep issues and how they are effecting my memory and my mind, having more hallucinations, feeling out of it". Justin [name] will improve psychological functioning as evidenced by reduced experience of psychoses, improvement in sleep, and more stabilized mood 3 out of 7 days.  ProgressTowards Goals: Not Progressing  Interventions: CBT  Summary: Justin [name] is a 76 y.o. male who presents with Bipolar I Disorder, mixed, moderate  Suicidal/Homicidal: Nowithout intent/plan  Therapist Response: Justin [name] engaged well in individual telephonic session with clinician. Clinician utilized CBT to explore thoughts, feelings, and  behaviors. Justin [name] shared updates at home and with family. He continues to struggle with communication with everyone, particularly his children and sister. Clinician explored other friendships and relationships. Justin [name] shared good relationship with neighbors who are very helpful to him and share concern for him. Clinician discussed expectations that he has for his family, which are never consistently met. Clinician explored options for changing those expectations and transitioning them into reality testing. Clinician challenged Justin [name] to think realistically about how much involvement he has with his family and how much he wants. Clinician also identified the importance of communicating his wants and expectations to those people and requesting their involvement in his life.   Plan: Return again in 2-3 weeks.  Diagnosis: Bipolar 1 disorder, mixed, moderate ([place])  Collaboration of Care: Other none required in this session  Patient/Guardian was advised Release of Information must be obtained prior to any record release in order to collaborate their care with an outside provider. Patient/Guardian was advised if they have not already done so to contact the registration department to sign all necessary forms in order for [place] to release information regarding their care.   Consent: Patient/Guardian gives verbal consent for treatment and assignment of benefits for services provided during this visit. Patient/Guardian expressed understanding and agreed to proceed.   [place], LCSW 01/06/2022

## 2022-01-11 ENCOUNTER — Other Ambulatory Visit: Payer: Self-pay | Admitting: Internal Medicine

## 2022-01-19 ENCOUNTER — Ambulatory Visit (HOSPITAL_COMMUNITY): Payer: Medicare Other | Admitting: Licensed Clinical Social Worker

## 2022-02-02 ENCOUNTER — Ambulatory Visit: Payer: Medicare Other | Admitting: Internal Medicine

## 2022-02-02 ENCOUNTER — Encounter: Payer: Self-pay | Admitting: Internal Medicine

## 2022-02-02 VITALS — BP 138/82 | HR 81 | Temp 97.9°F | Ht 71.0 in | Wt 170.4 lb

## 2022-02-02 DIAGNOSIS — M109 Gout, unspecified: Secondary | ICD-10-CM

## 2022-02-02 DIAGNOSIS — R739 Hyperglycemia, unspecified: Secondary | ICD-10-CM | POA: Diagnosis not present

## 2022-02-02 DIAGNOSIS — E559 Vitamin D deficiency, unspecified: Secondary | ICD-10-CM | POA: Diagnosis not present

## 2022-02-02 DIAGNOSIS — I10 Essential (primary) hypertension: Secondary | ICD-10-CM

## 2022-02-02 MED ORDER — METHYLPREDNISOLONE ACETATE 80 MG/ML IJ SUSP
80.0000 mg | Freq: Once | INTRAMUSCULAR | Status: AC
  Administered 2022-02-02: 80 mg via INTRAMUSCULAR

## 2022-02-02 MED ORDER — PREDNISONE 10 MG PO TABS: ORAL_TABLET | ORAL | 0 refills | Status: DC

## 2022-02-02 NOTE — Patient Instructions (Signed)
You had the steroid shot today  Please take all new medication as prescribed - the prednisone  Please continue all other medications as before, and refills have been done if requested.  Please have the pharmacy call with any other refills you may need  Please keep your appointments with your specialists as you may have planned     

## 2022-02-02 NOTE — Progress Notes (Signed)
Patient ID: Justin [name], male   DOB: 1946-08-21, 76 y.o.   MRN: [id number]        Chief Complaint: follow up right foot first mtp pain/swelling, htn, hyperglycemia, low vit d       HPI:  [name] is a 76 y.o. male here with c/o 5 days onset sudden severe right first mtp pain, swelling and some redness, sharp, worse to walk, better to sit. No fever or truama.   Pt denies chest pain, increased sob or doe, wheezing, orthopnea, PND, increased LE swelling, palpitations, dizziness or syncope.   Pt denies polydipsia, polyuria, or new focal neuro s/s.  Not taking Vit D.         Wt Readings from Last 3 Encounters:  02/03/22 172 lb (78 kg)  02/02/22 170 lb 6.4 oz (77.3 kg)  10/12/21 170 lb (77.1 kg)   BP Readings from Last 3 Encounters:  02/03/22 (!) 148/76  02/02/22 138/82  10/12/21 138/76         Past Medical History:  Diagnosis Date   Anxiety and depression    Arthritis    knees, c-spine // gets lumbar ESI q 6 mos   Bipolar disorder ([place])    CAD (coronary artery disease)    [place] 08/2009 >> LHC ([facility]) - mLAD 70, Dx 65 >> PCI:  3x15 mm Endeavor DES to mLAD and 2.5x12 mm Endeavor DES to Dx // S/p NSTEMI >> LHC 8/12 ([facility]) - LM ok, LAD prox and mid 40; LAD stent ok, Dx stent ok, mRCA 30, mLCx 50 >> med Rx // ETT-Echo 2/17 ([facility]): Normal, EF 55-60 at rest   Chicken pox    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[place])    Grief    History of echocardiogram    Echo 3/19: Mild concentric LVH, EF 60-65, normal wall motion, grade 1 diastolic dysfunction, mild AI, mildly dilated aortic root (40 mm), MAC, mild LAE, atrial septal lipomatous hypertrophy // Echo 8/22: EF 55-60, no RWMA, mild LVH, GRII DD, normal RVSF, RVSP 40, mild LAE, trivial MR, mild-moderate AI, AV sclerosis without stenosis, mild dilation of aortic root (39 mm)   History of non-ST elevation myocardial infarction (NSTEMI)    History of nuclear stress test    Nuclear stress test 3/19: EF 57, inferior/inferoseptal/inferolateral  defect consistent with probable soft tissue attenuation (cannot exclude subendocardial scar), no ischemia, intermediate risk >> Echo 3/19 normal EF, normal wall motion   History of pneumonia 2011   History of prostatitis 1990s   Hx of adenomatous colonic polyps 06/22/2016   Hyperlipidemia    Hypertension    Migraines    prior history   PTSD (post-traumatic stress disorder)    Past Surgical History:  Procedure Laterality Date   CARDIOVERSION N/A 08/05/2021   Procedure: CARDIOVERSION;  Surgeon: Nahser, Wonda Cheng, MD;  Location: [facility] ENDOSCOPY;  Service: Cardiovascular;  Laterality: N/A;   CERVICAL DISCECTOMY     COLONOSCOPY  03/2017   ELBOW SURGERY Left    ELBOW SURGERY Right    INGUINAL HERNIA REPAIR Bilateral    1996, 1997   KNEE ARTHROSCOPY Right    KNEE CARTILAGE SURGERY Left    NASAL SEPTUM SURGERY     STENT PLACEMENT VASCULAR ([facility])  09/02/2010   TONSILLECTOMY     TOTAL KNEE ARTHROPLASTY Left 08/31/2018   Procedure: TOTAL KNEE ARTHROPLASTY;  Surgeon: Dorna Leitz, MD;  Location: [facility] ORS;  Service: Orthopedics;  Laterality: Left;    reports that he has never  smoked. He has never used smokeless tobacco. He reports that he does not currently use alcohol after a past usage of about 6.0 standard drinks of alcohol per week. He reports that he does not use drugs. family history includes Alzheimer's disease in his mother; Arthritis in his father and mother; Depression in his brother; Diabetes in his sister and sister; Heart attack in his father, mother, and sister; Heart disease in his father and mother; Hyperlipidemia in his father; Hypertension in his father and mother; Lung cancer in his maternal aunt, paternal aunt, and paternal grandmother; Multiple sclerosis in his sister; Pulmonary embolism in his sister; Stroke in his father. Allergies  Allergen Reactions   Ambien [Zolpidem Tartrate] Other (See Comments)    Blackout, memory issues   Codeine     Other reaction(s): Other (See  Comments) Memory issues Other reaction(s): Other Memory issues   Ativan [Lorazepam] Other (See Comments)    Made him feel completely out of it fell down   Seroquel [Quetiapine]     SEVERE NIGHTMARES, SLEEPWALK AND NIGHT DRIVE WITH NO RECOLLECTION UPON WAKENING   Current Outpatient Medications on File Prior to Visit  Medication Sig Dispense Refill   ALPRAZolam (XANAX) 1 MG tablet TAKE 1/2 TO 1 TABLET BY MOUTH ONCE DAILY 30 tablet 2   apixaban (ELIQUIS) 5 MG TABS tablet Take 1 tablet (5 mg total) by mouth 2 (two) times daily. 180 tablet 3   diltiazem (CARDIZEM CD) 180 MG 24 hr capsule TAKE 1 CAPSULE BY MOUTH DAILY 30 capsule 11   lamoTRIgine (LAMICTAL) 200 MG tablet Take 1 tablet (200 mg total) by mouth daily. For mood control 30 tablet 2   lamoTRIgine (LAMICTAL) 25 MG tablet Take one tab twice daily with 200 mg daily Lamictal. 30 tablet 2   nitroGLYCERIN (NITROSTAT) 0.4 MG SL tablet Place 1 tablet (0.4 mg total) under the tongue every 5 (five) minutes as needed for chest pain. 25 tablet 3   rosuvastatin (CRESTOR) 20 MG tablet One tablet by mouth ( 20 mg) daily. 30 tablet 11   isosorbide mononitrate (IMDUR) 30 MG 24 hr tablet Take 1 tablet (30 mg total) by mouth daily. 30 tablet 11   No current facility-administered medications on file prior to visit.        ROS:  All others reviewed and negative.  Objective        PE:  BP 138/82 (BP Location: Right Arm, Patient Position: Sitting, Cuff Size: Large)   Pulse 81   Temp 97.9 F (36.6 C) (Oral)   Ht _0  (1.803 m)   Wt 170 lb 6.4 oz (77.3 kg)   SpO2 99%   BMI 23.77 kg/m                 Constitutional: Pt appears in NAD               HENT: Head: NCAT.                Right Ear: External ear normal.                 Left Ear: External ear normal.                Eyes: . Pupils are equal, round, and reactive to light. Conjunctivae and EOM are normal               Nose: without d/c or deformity  Neck: Neck supple. Gross  normal ROM               Cardiovascular: Normal rate and regular rhythm.                 Pulmonary/Chest: Effort normal and breath sounds without rales or wheezing.                Abd:  Soft, NT, ND, + BS, no organomegaly               Neurological: Pt is alert. At baseline orientation, motor grossly intact               Skin: Skin is warm. No rashes, no other new lesions, LE edema - none, except right foot first mtp with 2+ red, tender, swelling o/w neurovasc intact               Psychiatric: Pt behavior is normal without agitation   Micro: none  Cardiac tracings I have personally interpreted today:  none  Pertinent Radiological findings (summarize): none   Lab Results  Component Value Date   WBC 4.6 10/12/2021   HGB 13.5 10/12/2021   HCT 40.7 10/12/2021   PLT 215.0 10/12/2021   GLUCOSE 104 (H) 10/12/2021   CHOL 137 10/12/2021   TRIG 44.0 10/12/2021   HDL 77.80 10/12/2021   LDLCALC 50 10/12/2021   ALT 19 10/12/2021   AST 25 10/12/2021   NA 132 (L) 10/12/2021   K 4.5 10/12/2021   CL 99 10/12/2021   CREATININE 0.88 10/12/2021   BUN 20 10/12/2021   CO2 29 10/12/2021   TSH 0.63 10/12/2021   PSA 2.17 10/12/2021   INR 0.95 08/21/2018   HGBA1C 4.6 10/12/2021   MICROALBUR 1.0 10/12/2021   Assessment/Plan:  DEEPAK BLESS is a 76 y.o. 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  Acute gouty arthritis No hx of prior gout, but exam c/w this, for depomedrol I'm 80 mg, prednisone taper,  to f/u any worsening symptoms or  concerns  Hyperglycemia Lab Results  Component Value Date   HGBA1C 4.6 10/12/2021   Stable, pt to continue current medical treatment  - diet, wt control   Hypertension BP Readings from Last 3 Encounters:  02/03/22 (!) 148/76  02/02/22 138/82  10/12/21 138/76   Mild uncontrolled, likely reactive, pt to continue medical treatment cardizem cd 180 qd and follow at home and next visit   Vitamin D deficiency Last vitamin D Lab Results  Component Value Date   VD25OH 38.10 10/12/2021   Low, to start oral replacement  Followup: Return if symptoms worsen or fail to improve.  Cathlean Cower, MD 02/05/2022 5:49 PM [facility]

## 2022-02-03 ENCOUNTER — Ambulatory Visit: Payer: Medicare Other | Admitting: Cardiology

## 2022-02-03 ENCOUNTER — Encounter: Payer: Self-pay | Admitting: Cardiology

## 2022-02-03 ENCOUNTER — Ambulatory Visit (HOSPITAL_COMMUNITY): Payer: Medicare Other | Admitting: Licensed Clinical Social Worker

## 2022-02-03 VITALS — BP 148/76 | HR 88 | Ht 71.0 in | Wt 172.0 lb

## 2022-02-03 DIAGNOSIS — I25119 Atherosclerotic heart disease of native coronary artery with unspecified angina pectoris: Secondary | ICD-10-CM | POA: Diagnosis not present

## 2022-02-03 DIAGNOSIS — I10 Essential (primary) hypertension: Secondary | ICD-10-CM | POA: Diagnosis not present

## 2022-02-03 DIAGNOSIS — I4819 Other persistent atrial fibrillation: Secondary | ICD-10-CM

## 2022-02-03 NOTE — Patient Instructions (Addendum)
Medication Instructions:  Your physician recommends that you continue on your current medications as directed. Please refer to the Current Medication list given to you today. *If you need a refill on your cardiac medications before your next appointment, please call your pharmacy*  Lab Work: Sept 5 If you have labs (blood work) drawn today and your tests are completely normal, you will receive your results only by: [place] (if you have MyChart) OR A paper copy in the mail If you have any lab test that is abnormal or we need to change your treatment, we will call you to review the results.  Testing/Procedures: Your physician has requested that you have cardiac CT. Cardiac computed tomography (CT) is a painless test that uses an x-ray machine to take clear, detailed pictures of your heart. For further information please visit [email or web address]. Please follow instruction sheet as given.   Your physician has recommended that you have an ablation. Catheter ablation is a medical procedure used to treat some cardiac arrhythmias (irregular heartbeats). During catheter ablation, a long, thin, flexible tube is put into a blood vessel in your groin (upper thigh), or neck. This tube is called an ablation catheter. It is then guided to your heart through the blood vessel. Radio frequency waves destroy small areas of heart tissue where abnormal heartbeats may cause an arrhythmia to start. Please see the instruction sheet given to you today.   Follow-Up: At [facility], you and your health needs are our priority.  As part of our continuing mission to provide you with exceptional heart care, we have created designated Provider Care Teams.  These Care Teams include your primary Cardiologist (physician) and Advanced Practice Providers (APPs -  Physician Assistants and Nurse Practitioners) who all work together to provide you with the care you need, when you need it.  Your physician wants you to  follow-up in: on Sept 5 please come to the [address] office for pre op lab work. No fasting, anytime from 8 am to 4 pm You will meet with Justin Kluver RN for your instructions, for CT and Ablation. Ablation date picked September 26.   We recommend signing up for the patient portal called "MyChart".  Sign up information is provided on this After Visit Summary.  MyChart is used to connect with patients for Virtual Visits (Telemedicine).  Patients are able to view lab/test results, encounter notes, upcoming appointments, etc.  Non-urgent messages can be sent to your provider as well.   To learn more about what you can do with MyChart, go to [email or web address].    Any Other Special Instructions Will Be Listed Below (If Applicable).  Cardiac Ablation Cardiac ablation is a procedure to destroy (ablate) some heart tissue that is sending bad signals. These bad signals cause problems in heart rhythm. The heart has many areas that make these signals. If there are problems in these areas, they can make the heart beat in a way that is not normal. Destroying some tissues can help make the heart rhythm normal. Tell your doctor about: Any allergies you have. All medicines you are taking. These include vitamins, herbs, eye drops, creams, and over-the-counter medicines. Any problems you or family members have had with medicines that make you fall asleep (anesthetics). Any blood disorders you have. Any surgeries you have had. Any medical conditions you have, such as kidney failure. Whether you are pregnant or may be pregnant. What are the risks? This is a safe procedure. But problems may occur, including: Infection. Bruising and  bleeding. Bleeding into the chest. Stroke or blood clots. Damage to nearby areas of your body. Allergies to medicines or dyes. The need for a pacemaker if the normal system is damaged. Failure of the procedure to treat the problem. What happens before the procedure? Medicines Ask  your doctor about: Changing or stopping your normal medicines. This is important. Taking aspirin and ibuprofen. Do not take these medicines unless your doctor tells you to take them. Taking other medicines, vitamins, herbs, and supplements. General instructions Follow instructions from your doctor about what you cannot eat or drink. Plan to have someone take you home from the hospital or clinic. If you will be going home right after the procedure, plan to have someone with you for 24 hours. Ask your doctor what steps will be taken to prevent infection. What happens during the procedure?  An IV tube will be put into one of your veins. You will be given a medicine to help you relax. The skin on your neck or groin will be numbed. A cut (incision) will be made in your neck or groin. A needle will be put through your cut and into a large vein. A tube (catheter) will be put into the needle. The tube will be moved to your heart. Dye may be put through the tube. This helps your doctor see your heart. Small devices (electrodes) on the tube will send out signals. A type of energy will be used to destroy some heart tissue. The tube will be taken out. Pressure will be held on your cut. This helps stop bleeding. A bandage will be put over your cut. The exact procedure may vary among doctors and hospitals. What happens after the procedure? You will be watched until you leave the hospital or clinic. This includes checking your heart rate, breathing rate, oxygen, and blood pressure. Your cut will be watched for bleeding. You will need to lie still for a few hours. Do not drive for 24 hours or as long as your doctor tells you. Summary Cardiac ablation is a procedure to destroy some heart tissue. This is done to treat heart rhythm problems. Tell your doctor about any medical conditions you may have. Tell him or her about all medicines you are taking to treat them. This is a safe procedure. But problems  may occur. These include infection, bruising, bleeding, and damage to nearby areas of your body. Follow what your doctor tells you about food and drink. You may also be told to change or stop some of your medicines. After the procedure, do not drive for 24 hours or as long as your doctor tells you. This information is not intended to replace advice given to you by your health care provider. Make sure you discuss any questions you have with your health care provider. Document Revised: 07/11/2019 Document Reviewed: 07/11/2019 Elsevier Patient Education  [place]

## 2022-02-03 NOTE — Progress Notes (Signed)
Electrophysiology Office Note:    Date:  02/03/2022   ID:  Justin [name], DOB 07-17-1946, MRN [id number]  PCP:  Biagio Borg, MD  [facility] Cardiologist:  Sherren Mocha, MD  [facility] Electrophysiologist:  Vickie Epley, MD   Referring MD: Oliver Barre, [name]   Chief Complaint: New patient consult for Ablation  History of Present Illness:    Justin [name] is a 76 y.o. male who presents for an evaluation for atrial fibrillation ablation at the request of [name], [name]. Their medical history includes NSTEMI, CAD, hypertension, hyperlipidemia, pneumonia, arthritis, dissociative amnesia, bipolar disorder, depression and PTSD.  He saw [name], PA on 08/18/2021 and was s/p DCCV on 08/05/2021. He was found to be back in rate controlled atrial fibrillation, on diltiazem and Eliquis. Would avoid class IC with h/o CAD, can not use dofetilide with lamotrigine. He was not interested in repeat DCCV. After discussion of Multaq, amiodarone, and ablation, he was agreeable to a consultation with EP.  Today, he confirms that he had initially felt like his Afib had returned immediately after the DCCV, but this feeling did not last long.  Every so often he feels skipped beats associated with a quick, stabbing chest pain. His chest pain has been intermittent and is worse today. He becomes short of breath easily, and very fatigued consistently.   Lately he has not been active like he used to be due to an ankle surgery last October. Unfortunately this resulted in nerve damage. With or without activity, he feels limited by his ankle pain.  He denies any peripheral edema. No lightheadedness, headaches, syncope, orthopnea, or PND.     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[place])    Grief    History of echocardiogram    Echo 3/19: Mild concentric LVH, EF 60-65, normal wall motion, grade 1 diastolic dysfunction, mild AI, mildly dilated aortic root (40 mm), MAC, mild LAE, atrial septal lipomatous hypertrophy // Echo 8/22: EF 55-60, no RWMA, mild LVH, GRII DD, normal RVSF, RVSP 40, mild LAE, trivial MR, mild-moderate AI, AV sclerosis without stenosis, mild dilation of aortic root (39 mm)   History of non-ST elevation myocardial infarction (NSTEMI)    History of nuclear stress test    Nuclear stress test 3/19: EF 57, inferior/inferoseptal/inferolateral defect consistent with probable soft tissue attenuation (cannot exclude subendocardial scar), no ischemia, intermediate risk >> Echo 3/19 normal EF, normal wall motion   History of pneumonia 2011   History of prostatitis 1990s   Hx of adenomatous colonic polyps 06/22/2016   Hyperlipidemia    Hypertension    Migraines    prior history   PTSD (post-traumatic stress disorder)     Past Surgical History:  Procedure Laterality Date   CARDIOVERSION N/A 08/05/2021   Procedure: CARDIOVERSION;  Surgeon: Thayer Headings, MD;  Location: [facility];  Service: Cardiovascular;  Laterality: N/A;   CERVICAL DISCECTOMY     COLONOSCOPY  03/2017   ELBOW SURGERY Left    ELBOW SURGERY Right    INGUINAL HERNIA REPAIR Bilateral    1996, 1997   KNEE ARTHROSCOPY Right    KNEE CARTILAGE SURGERY Left    NASAL SEPTUM SURGERY     STENT PLACEMENT VASCULAR ([place] [facility])  09/02/2010   TONSILLECTOMY     TOTAL KNEE ARTHROPLASTY Left 08/31/2018   Procedure: TOTAL KNEE ARTHROPLASTY;  Surgeon: Dorna Leitz, MD;  Location: [facility];  Service: Orthopedics;  Laterality: Left;     Current Medications: Current Meds  Medication Sig   ALPRAZolam (XANAX) 1 MG tablet TAKE 1/2 TO 1 TABLET BY MOUTH ONCE DAILY   apixaban (ELIQUIS) 5 MG TABS tablet Take 1 tablet (5 mg total) by mouth 2 (two) times daily.   diltiazem (CARDIZEM CD) 180 MG 24 hr capsule TAKE 1 CAPSULE BY MOUTH DAILY   isosorbide mononitrate (IMDUR) 30 MG 24 hr tablet Take 1 tablet (30 mg total) by mouth daily.   lamoTRIgine (LAMICTAL) 200 MG tablet Take 1 tablet (200 mg total) by mouth daily. For mood control   lamoTRIgine (LAMICTAL) 25 MG tablet Take one tab twice daily with 200 mg daily Lamictal.   nitroGLYCERIN (NITROSTAT) 0.4 MG SL tablet Place 1 tablet (0.4 mg total) under the tongue every 5 (five) minutes as needed for chest pain.   predniSONE (DELTASONE) 10 MG tablet 3 tabs by mouth per day for 3 days,2tabs per day for 3 days,1tab per day for 3 days   rosuvastatin (CRESTOR) 20 MG tablet One tablet by mouth ( 20 mg) daily.     Allergies:   Ambien [zolpidem tartrate], Codeine, Ativan [lorazepam], and Seroquel [quetiapine]   Social History   Socioeconomic History   Marital status: Widowed    Spouse name: Not on file   Number of children: 2   Years of education: 25   Highest education level: Not on file  Occupational History   Occupation: Retired  Tobacco Use   Smoking status: Never   Smokeless tobacco: Never  Vaping Use   Vaping Use: Never used  Substance and Sexual Activity   Alcohol use: Not Currently    Alcohol/week: 6.0 standard drinks of alcohol    Types: 6 Cans of beer per week    Comment: 3 beers in a sitting   Drug use: No   Sexual activity: Not Currently  Other Topics Concern   Not on file  Social History Narrative   Retired, widowed in 2017    1 son / 1 daughter   2 caffeinated beverages daily no alcohol or tobacco   Fun: Work out in the yard.   Denies religious beliefs effecting healthcare.    Worked at [employer] for 30 years   Social Determinants of [employer]: Low Risk  (12/23/2021)   Overall Financial Resource Strain (CARDIA)    Difficulty of Paying Living Expenses: Not hard at all  Food Insecurity: No Food Insecurity (12/23/2021)   Hunger Vital Sign    Worried About Running Out of Food in the Last Year: Never true    [place] in the Last Year: Never true  Transportation Needs: No Transportation Needs (12/23/2021)   PRAPARE - Hydrologist (Medical): No    Lack of Transportation (Non-Medical): No  Physical Activity: Inactive (12/23/2021)   Exercise Vital Sign  Days of Exercise per Week: 0 days    Minutes of Exercise per Session: 0 min  Stress: Stress Concern Present (12/23/2021)   Strong    Feeling of Stress : To some extent  Social Connections: Socially Isolated (12/23/2021)   Social Connection and Isolation Panel [NHANES]    Frequency of Communication with Friends and Family: More than three times a week    Frequency of Social Gatherings with Friends and Family: Once a week    Attends Religious Services: Never    Marine scientist or Organizations: No    Attends Archivist Meetings: Never    Marital Status: Widowed     Family History: The patient's family history includes Alzheimer's disease in his mother; Arthritis in his father and mother; Depression in his brother; Diabetes in his sister and sister; Heart attack in his father, mother, and sister; Heart disease in his father and mother; Hyperlipidemia in his father; Hypertension in his father and mother; Lung cancer in his maternal aunt, paternal aunt, and paternal grandmother; Multiple sclerosis in his sister; Pulmonary embolism in his sister; Stroke in his father.  ROS:   Please see the history of present illness.    (+) Palpitations (+) Stabbing chest pain (+) Shortness of breath (+) Fatigue (+) Ankle pain All other systems reviewed and  are negative.  EKGs/Labs/Other Studies Reviewed:    The following studies were reviewed today:  05/2021  Monitor: Patient had a minimum heart rate of 47 bpm, maximum heart rate of 180 bpm, and average heart rate of 79 bpm. Predominant underlying rhythm was atrial fibrillation (100%). Triggered and diary events associated with atrial fibrillation (rates 60s-110s).   Symptomatic persistent atrial fibrillation.  03/23/2021  Echo:  1. Left ventricular ejection fraction, by estimation, is 55 to 60%. The  left ventricle has normal function. The left ventricle has no regional  wall motion abnormalities. There is mild left ventricular hypertrophy.  Left ventricular diastolic parameters  are consistent with Grade II diastolic dysfunction (pseudonormalization).   2. Right ventricular systolic function is normal. The right ventricular  size is mildly enlarged. There is mildly elevated pulmonary artery  systolic pressure. The estimated right ventricular systolic pressure is  20.2 mmHg.   3. Left atrial size was mildly dilated.   4. The mitral valve is normal in structure. Trivial mitral valve  regurgitation. No evidence of mitral stenosis.   5. The aortic valve is tricuspid. Aortic valve regurgitation is mild to  moderate. Mild aortic valve sclerosis is present, with no evidence of  aortic valve stenosis.   6. Aortic dilatation noted. There is mild dilatation of the aortic root,  measuring 39 mm.   7. The inferior vena cava is dilated in size with <50% respiratory  variability, suggesting right atrial pressure of 15 mmHg.    EKG:   EKG is personally reviewed.  02/03/2022: EKG was not ordered.   Recent Labs: 10/12/2021: ALT 19; BUN 20; Creatinine, Ser 0.88; Hemoglobin 13.5; Platelets 215.0; Potassium 4.5; Sodium 132; TSH 0.63   Recent Lipid Panel    Component Value Date/Time   CHOL 137 10/12/2021 1527   TRIG 44.0 10/12/2021 1527   HDL 77.80 10/12/2021 1527   CHOLHDL 2 10/12/2021 1527    VLDL 8.8 10/12/2021 1527   LDLCALC 50 10/12/2021 1527    Physical Exam:    VS:  BP (!) 148/76   Pulse 88   Ht _0  (1.803 m)  Wt 172 lb (78 kg)   SpO2 97%   BMI 23.99 kg/m     Wt Readings from Last 3 Encounters:  02/03/22 172 lb (78 kg)  02/02/22 170 lb 6.4 oz (77.3 kg)  10/12/21 170 lb (77.1 kg)     GEN: Well nourished, well developed in no acute distress HEENT: Normal NECK: No JVD; No carotid bruits LYMPHATICS: No lymphadenopathy CARDIAC: RRR, no murmurs, rubs, gallops RESPIRATORY:  Clear to auscultation without rales, wheezing or rhonchi  ABDOMEN: Soft, non-tender, non-distended MUSCULOSKELETAL:  No edema; No deformity  SKIN: Warm and dry NEUROLOGIC:  Alert and oriented x 3 PSYCHIATRIC:  Normal affect       ASSESSMENT:    1. Persistent atrial fibrillation ([place])   2. Coronary artery disease involving native coronary artery of native heart with angina pectoris ([place])   3. Primary hypertension    PLAN:    In order of problems listed above:   #Persistent atrial fibrillation Mild symptoms of fatigue and dyspnea.  I discussed treatment options available for his atrial fibrillation including a conservative management strategy, rhythm control using antiarrhythmic drugs and rhythm control using a catheter ablation.  He prefers a strategy that avoids long-term exposure antiarrhythmic drug therapy if at all possible.  He takes Eliquis for stroke prophylaxis.  After a long discussion, the patient elected to proceed with catheter ablation of his atrial fibrillation.  Discussed treatment options today for his AF including antiarrhythmic drug therapy and ablation. Discussed risks, recovery and likelihood of success. Discussed potential need for repeat ablation procedures and antiarrhythmic drugs after an initial ablation. They wish to proceed with scheduling.  Risk, benefits, and alternatives to EP study and radiofrequency ablation for afib were also discussed in detail  today. These risks include but are not limited to stroke, bleeding, vascular damage, tamponade, perforation, damage to the esophagus, lungs, and other structures, pulmonary vein stenosis, worsening renal function, and death. The patient understands these risk and wishes to proceed.  We will therefore proceed with catheter ablation at the next available time.  Carto, ICE, anesthesia are requested for the procedure.  Will also obtain CT PV protocol prior to the procedure to exclude LAA thrombus and further evaluate atrial anatomy.   Total time spent with patient today 60 minutes. This includes reviewing records, evaluating the patient and coordinating care.  Medication Adjustments/Labs and Tests Ordered: Current medicines are reviewed at length with the patient today.  Concerns regarding medicines are outlined above.  No orders of the defined types were placed in this encounter.  No orders of the defined types were placed in this encounter.   I,Mathew Stumpf,acting as a Education administrator for Vickie Epley, MD.,have documented all relevant documentation on the behalf of Vickie Epley, MD,as directed by  Vickie Epley, MD while in the presence of Vickie Epley, MD.  I, Vickie Epley, MD, have reviewed all documentation for this visit. The documentation on 02/03/22 for the exam, diagnosis, procedures, and orders are all accurate and complete.   Signed, Hilton Cork. Quentin Ore, MD, [facility], [facility] 02/03/2022 10:49 PM    Electrophysiology [facility]

## 2022-02-05 ENCOUNTER — Encounter: Payer: Self-pay | Admitting: Internal Medicine

## 2022-02-05 NOTE — Assessment & Plan Note (Signed)
BP Readings from Last 3 Encounters:  02/03/22 (!) 148/76  02/02/22 138/82  10/12/21 138/76   Mild uncontrolled, likely reactive, pt to continue medical treatment cardizem cd 180 qd and follow at home and next visit

## 2022-02-05 NOTE — Assessment & Plan Note (Signed)
No hx of prior gout, but exam c/w this, for depomedrol I'm 80 mg, prednisone taper,  to f/u any worsening symptoms or concerns

## 2022-02-05 NOTE — Assessment & Plan Note (Signed)
Last vitamin D Lab Results  Component Value Date   VD25OH 38.10 10/12/2021   Low, to start oral replacement

## 2022-02-05 NOTE — Assessment & Plan Note (Signed)
Lab Results  Component Value Date   HGBA1C 4.6 10/12/2021   Stable, pt to continue current medical treatment  - diet, wt control

## 2022-02-17 ENCOUNTER — Ambulatory Visit (HOSPITAL_COMMUNITY): Payer: Medicare Other | Admitting: Licensed Clinical Social Worker

## 2022-03-10 ENCOUNTER — Ambulatory Visit (HOSPITAL_COMMUNITY): Payer: Medicare Other | Attending: Internal Medicine

## 2022-03-10 DIAGNOSIS — I080 Rheumatic disorders of both mitral and aortic valves: Secondary | ICD-10-CM | POA: Insufficient documentation

## 2022-03-10 DIAGNOSIS — I119 Hypertensive heart disease without heart failure: Secondary | ICD-10-CM | POA: Diagnosis not present

## 2022-03-10 DIAGNOSIS — I77810 Thoracic aortic ectasia: Secondary | ICD-10-CM | POA: Diagnosis not present

## 2022-03-10 DIAGNOSIS — I340 Nonrheumatic mitral (valve) insufficiency: Secondary | ICD-10-CM

## 2022-03-10 DIAGNOSIS — R739 Hyperglycemia, unspecified: Secondary | ICD-10-CM | POA: Diagnosis not present

## 2022-03-10 DIAGNOSIS — I252 Old myocardial infarction: Secondary | ICD-10-CM | POA: Insufficient documentation

## 2022-03-10 DIAGNOSIS — I712 Thoracic aortic aneurysm, without rupture, unspecified: Secondary | ICD-10-CM | POA: Insufficient documentation

## 2022-03-10 DIAGNOSIS — E785 Hyperlipidemia, unspecified: Secondary | ICD-10-CM | POA: Diagnosis not present

## 2022-03-10 LAB — ECHOCARDIOGRAM COMPLETE
MV M vel: 4.55 m/s
MV Peak grad: 82.8 mmHg
P 1/2 time: 1113 msec
S' Lateral: 2.6 cm

## 2022-03-11 ENCOUNTER — Encounter: Payer: Self-pay | Admitting: Physician Assistant

## 2022-03-11 ENCOUNTER — Telehealth (HOSPITAL_BASED_OUTPATIENT_CLINIC_OR_DEPARTMENT_OTHER): Payer: Medicare Other | Admitting: Psychiatry

## 2022-03-11 ENCOUNTER — Encounter (HOSPITAL_COMMUNITY): Payer: Self-pay | Admitting: Psychiatry

## 2022-03-11 ENCOUNTER — Other Ambulatory Visit (HOSPITAL_COMMUNITY): Payer: Self-pay | Admitting: *Deleted

## 2022-03-11 VITALS — Wt 172.0 lb

## 2022-03-11 DIAGNOSIS — F4310 Post-traumatic stress disorder, unspecified: Secondary | ICD-10-CM

## 2022-03-11 DIAGNOSIS — F419 Anxiety disorder, unspecified: Secondary | ICD-10-CM | POA: Diagnosis not present

## 2022-03-11 DIAGNOSIS — F3162 Bipolar disorder, current episode mixed, moderate: Secondary | ICD-10-CM | POA: Diagnosis not present

## 2022-03-11 DIAGNOSIS — I351 Nonrheumatic aortic (valve) insufficiency: Secondary | ICD-10-CM | POA: Insufficient documentation

## 2022-03-11 HISTORY — DX: Nonrheumatic aortic (valve) insufficiency: I35.1

## 2022-03-11 MED ORDER — LAMOTRIGINE 25 MG PO TABS: ORAL_TABLET | ORAL | 2 refills | Status: DC

## 2022-03-11 MED ORDER — LAMOTRIGINE 200 MG PO TABS: 200.0000 mg | ORAL_TABLET | Freq: Every day | ORAL | 2 refills | Status: DC

## 2022-03-11 NOTE — Progress Notes (Signed)
Virtual Visit via Telephone Note  I connected with Justin [name] on 03/11/22 at 10:00 AM EDT by telephone and verified that I am speaking with the correct person using two identifiers.  Location: Patient: Home Provider: Home Office   I discussed the limitations, risks, security and privacy concerns of performing an evaluation and management service by telephone and the availability of in person appointments. I also discussed with the patient that there may be a patient responsible charge related to this service. The patient expressed understanding and agreed to proceed.   History of Present Illness: Patient is evaluated by phone session.  He is taking Lamictal 200 mg in the morning and 25 mg at bedtime.  He is not taking Abilify for a while because he cannot afford.  However he feels his anger and irritability is okay but he admitted frustrated with his doctor who did ankle surgery because he believed there is a nerve damage and his daughter do not believe about nerve damage.  His doctor recommended to see a specialist in [place] and he does not want to go because it is too far.  He has multiple doctor's appointment.  He recently had cardiac ablation and now he is having a consultation on Monday to get injection in his back.  He sleeps on and off.  Though he was frustrated and angry on her doctor but denies any hallucination, paranoia, suicidal or homicidal thoughts.  He does have dreams on and off and flashbacks about the past but denies any recent mania, agitation.  He is also frustrated with the therapy appointments because his therapist has not consistent with appointment.  Later patient told that therapist had a family emergency and she could not see him.  He is getting Xanax from his primary care physician.  He has chronic issues with his son but that has been a stable.  Recently he find out that his deceased wife's sister husband died who was very young.  So far he is tolerating Lamictal  and has no rash, itching, shakes.  Past Psychiatric History:  H/O suicidal attempt by overdose on Ambien twice, hydroxyzine and alcohol. H/O bipolar disorder with multiple inpatient.  Did IOP. Last ER visit Sept 2020 and inpatient October 2018 at [facility]. Tried Zyprexa, Cymbalta, Seroquel, Vistaril, Neurontin, Wellbutrin and triptal. Tried low-dose doxepin and trazodone. Had a reaction with Ativan and pain medication.  Saw Dr. Letta Moynahan in past.  Psychiatric Specialty Exam: Physical Exam  Review of Systems  Musculoskeletal:  Positive for back pain.       Ankle pain    Weight 172 lb (78 kg).There is no height or weight on file to calculate BMI.  General Appearance: Casual  Eye Contact:  NA  Speech:  Slow  Volume:  Decreased  Mood:  Dysphoric and Irritable  Affect:  NA  Thought Process:  Descriptions of Associations: Intact  Orientation:  Full (Time, Place, and Person)  Thought Content:  Rumination  Suicidal Thoughts:  No  Homicidal Thoughts:  No  Memory:  Immediate;   Fair Recent;   Fair Remote;   Fair  Judgement:  Fair  Insight:  Shallow  Psychomotor Activity:  NA  Concentration:  Concentration: Fair and Attention Span: Fair  Recall:  Good  Fund of Knowledge:  Good  Language:  Good  Akathisia:  No  Handed:  Right  AIMS (if indicated):     Assets:  Communication Skills Desire for Improvement Housing  ADL's:  Intact  Cognition:  WNL  Sleep:   fair      Assessment and Plan: PTSD.  Bipolar disorder type I.  Anxiety.  Discussed psychosocial stressors, chronic issues lately health issues especially not getting better on his ankle pain.  He is going to have consultation with his physician on Monday to discuss if he can resume back injection that had helped in the past.  I encouraged to call the office to see if he can get a better clarification about his therapist appointment.  He agreed to do that.  He is taking lamotrigine 200 mg in the morning and 25 at bedtime.  I  recommend he can try 25 mg also in the morning to take twice a day to help his mood lability.  He agreed to give a try.  Discussed safety concerns at any time having active suicidal thoughts or homicidal halogen need to call 911 or local N0.  Follow-up in 3 months.   Follow Up Instructions:    I discussed the assessment and treatment plan with the patient. The patient was provided an opportunity to ask questions and all were answered. The patient agreed with the plan and demonstrated an understanding of the instructions.   The patient was advised to call back or seek an in-person evaluation if the symptoms worsen or if the condition fails to improve as anticipated.  Collaboration of Care: Other provider involved in patient's care AEB notes are in epic to review.  Patient/Guardian was advised Release of Information must be obtained prior to any record release in order to collaborate their care with an outside provider. Patient/Guardian was advised if they have not already done so to contact the registration department to sign all necessary forms in order for [place] to release information regarding their care.   Consent: Patient/Guardian gives verbal consent for treatment and assignment of benefits for services provided during this visit. Patient/Guardian expressed understanding and agreed to proceed.    I provided 27 minutes of non-face-to-face time during this encounter.   Kathlee Nations, MD

## 2022-03-14 ENCOUNTER — Telehealth: Payer: Self-pay | Admitting: *Deleted

## 2022-03-14 ENCOUNTER — Other Ambulatory Visit: Payer: Self-pay | Admitting: Physician Assistant

## 2022-03-14 DIAGNOSIS — I351 Nonrheumatic aortic (valve) insufficiency: Secondary | ICD-10-CM

## 2022-03-14 MED ORDER — NITROGLYCERIN 0.4 MG SL SUBL: 0.4000 mg | SUBLINGUAL_TABLET | SUBLINGUAL | 3 refills | Status: DC | PRN

## 2022-03-14 NOTE — Telephone Encounter (Signed)
-----   Message from Liliane Shi, [place] sent at 03/14/2022  5:32 AM EDT ----- Patient did not view MyChart comments.  Please notify patient of results. Richardson Dopp, PA-C    03/14/2022 5:32 AM

## 2022-03-15 ENCOUNTER — Telehealth: Payer: Self-pay | Admitting: Cardiology

## 2022-03-15 ENCOUNTER — Other Ambulatory Visit: Payer: Self-pay

## 2022-03-15 MED ORDER — ROSUVASTATIN CALCIUM 20 MG PO TABS: ORAL_TABLET | ORAL | 1 refills | Status: DC

## 2022-03-15 MED ORDER — ISOSORBIDE MONONITRATE ER 30 MG PO TB24: 30.0000 mg | ORAL_TABLET | Freq: Every day | ORAL | 1 refills | Status: DC

## 2022-03-15 NOTE — Telephone Encounter (Signed)
*  STAT* If patient is at the pharmacy, call can be transferred to refill team.   1. Which medications need to be refilled? (please list name of each medication and dose if known)   isosorbide mononitrate (IMDUR) 30 MG 24 hr tablet  rosuvastatin (CRESTOR) 20 MG tablet  2. Which pharmacy/location (including street and city if local pharmacy) is medication to be sent to?  [place], [place]  3. Do they need a 30 day or 90 day supply?   30 day  Patient is completely out of these medications.

## 2022-03-15 NOTE — Telephone Encounter (Signed)
Pt's medication was sent to pt's pharmacy as requested. Confirmation received.  °

## 2022-03-15 NOTE — Telephone Encounter (Signed)
Pt's medications were sent to pt's pharmacy as requested. Confirmation received.  

## 2022-03-18 DIAGNOSIS — I4891 Unspecified atrial fibrillation: Secondary | ICD-10-CM

## 2022-03-18 MED ORDER — METOPROLOL TARTRATE 100 MG PO TABS: ORAL_TABLET | ORAL | 0 refills | Status: DC

## 2022-03-29 ENCOUNTER — Ambulatory Visit (INDEPENDENT_AMBULATORY_CARE_PROVIDER_SITE_OTHER): Payer: Medicare Other | Admitting: Licensed Clinical Social Worker

## 2022-03-29 DIAGNOSIS — F3162 Bipolar disorder, current episode mixed, moderate: Secondary | ICD-10-CM | POA: Diagnosis not present

## 2022-03-31 ENCOUNTER — Encounter (HOSPITAL_COMMUNITY): Payer: Self-pay | Admitting: Licensed Clinical Social Worker

## 2022-03-31 NOTE — Progress Notes (Signed)
Virtual Visit via Telephone Note  I connected with Justin [name] on 03/31/22 at  2:30 PM EDT by telephone and verified that I am speaking with the correct person using two identifiers.  Location: Patient: home Provider: office   I discussed the limitations, risks, security and privacy concerns of performing an evaluation and management service by telephone and the availability of in person appointments. I also discussed with the patient that there may be a patient responsible charge related to this service. The patient expressed understanding and agreed to proceed.    I discussed the assessment and treatment plan with the patient. The patient was provided an opportunity to ask questions and all were answered. The patient agreed with the plan and demonstrated an understanding of the instructions.   The patient was advised to call back or seek an in-person evaluation if the symptoms worsen or if the condition fails to improve as anticipated.  I provided 50 minutes of non-face-to-face time during this encounter.   Justin Curling, LCSW   THERAPIST PROGRESS NOTE  Session Time: 2:30pm-3:20pm  Participation Level: Active  Behavioral Response: NAAlertDepressed  Type of Therapy: Individual Therapy  Treatment Goals addressed:  I'm really worried about my sleep issues and how they are effecting my memory and my mind, having more hallucinations, feeling out of it". Justin [name] will improve psychological functioning as evidenced by reduced experience of psychoses, improvement in sleep, and more stabilized mood 3 out of 7 days.  ProgressTowards Goals: Progressing  Interventions: CBT  Summary: Justin [name] is a 76 y.o. male who presents with Bipolar I Disorder, mixed, moderate    Suicidal/Homicidal: Nowithout intent/plan  Therapist Response: Justin [name] engaged well in virtual session with clinician. Clinician utilized CBT to process thoughts, feelings, interactions with son and family members  since last session. Clinician processed recent dreams of the past, particularly of his wife and brother. Clinician provided time and space for Justin [name] to process his life experiences and regrets from the past. Clinician also noted the long term impact of his past behaviors on his current relationships with son, sister, and extended family. Clinician explored interactions with son, noting ongoing ups and downs. Clinician identified that his hopes for a stable relationship with son are probably unlikely, but that there have been more positive interactions recently. Clinician discussed ways to accept son for who and how he is, much like his hope that his family would accept him for who and how he is.   Plan: Return again in 2-3 weeks.  Diagnosis: Bipolar 1 disorder, mixed, moderate ([place])  Collaboration of Care: Other none required at this time  Patient/Guardian was advised Release of Information must be obtained prior to any record release in order to collaborate their care with an outside provider. Patient/Guardian was advised if they have not already done so to contact the registration department to sign all necessary forms in order for [place] to release information regarding their care.   Consent: Patient/Guardian gives verbal consent for treatment and assignment of benefits for services provided during this visit. Patient/Guardian expressed understanding and agreed to proceed.   Justin Marker [place], LCSW 03/31/2022

## 2022-04-12 ENCOUNTER — Other Ambulatory Visit: Payer: Self-pay | Admitting: Internal Medicine

## 2022-04-19 ENCOUNTER — Ambulatory Visit (INDEPENDENT_AMBULATORY_CARE_PROVIDER_SITE_OTHER): Payer: Medicare Other | Admitting: Licensed Clinical Social Worker

## 2022-04-19 DIAGNOSIS — F3162 Bipolar disorder, current episode mixed, moderate: Secondary | ICD-10-CM

## 2022-04-21 ENCOUNTER — Encounter (HOSPITAL_COMMUNITY): Payer: Self-pay | Admitting: Licensed Clinical Social Worker

## 2022-04-21 NOTE — Progress Notes (Signed)
   THERAPIST PROGRESS NOTE  Session Time: 12:30pm-1:30pm  Participation Level: Active  Behavioral Response: Well GroomedAlertDepressed  Type of Therapy: Individual Therapy  Treatment Goals addressed:  I'm really worried about my sleep issues and how they are effecting my memory and my mind, having more hallucinations, feeling out of it". Justin [name] will improve psychological functioning as evidenced by reduced experience of psychoses, improvement in sleep, and more stabilized mood 3 out of 7 days.  ProgressTowards Goals: Progressing  Interventions: CBT  Summary: Justin [name] is a 76 y.o. male who presents with Bipolar I Disorder, mixed, moderate.   Suicidal/Homicidal: Nowithout intent/plan  Therapist Response: Justin [name] engaged well in individual in person session. Clinician utilized CBT to process recent ups and downs with son and daughter-in-law. Clinician provided time and space for Justin [name] to provide updates. Clinician summarized ongoing sense that Justin [name] does not have a realistic expectation of his relationship with son. Clinician challenged Justin [name] using reality testing about how his son has responded to him over the years, particularly since wife passed. Clinician reflected the instability in the relationship and Justin [name]'s belief that the daughter in law does not want Justin [name] in their lives. Clinician utilized "What if" scenarios to assess Justin [name]'s willingness to create stronger boundaries. Clinician also identified that due to Justin [name]'s very small support system, he will likely continue to allow son to drag him around. Justin [name] shared disappointment and sadness that he is missing time with his grandchildren.   Plan: Return again in 2 weeks.  Diagnosis: Bipolar 1 disorder, mixed, moderate ([place])  Collaboration of Care: Other clinician will provide updates to Dr. Adele Schilder  Patient/Guardian was advised Release of Information must be obtained prior to any record release in order to collaborate their care with an  outside provider. Patient/Guardian was advised if they have not already done so to contact the registration department to sign all necessary forms in order for [place] to release information regarding their care.   Consent: Patient/Guardian gives verbal consent for treatment and assignment of benefits for services provided during this visit. Patient/Guardian expressed understanding and agreed to proceed.   Jobe Marker [place], LCSW 04/21/2022

## 2022-04-26 ENCOUNTER — Ambulatory Visit: Payer: Medicare Other | Attending: Cardiology

## 2022-04-26 DIAGNOSIS — I4891 Unspecified atrial fibrillation: Secondary | ICD-10-CM

## 2022-04-26 LAB — CBC WITH DIFFERENTIAL/PLATELET
Basophils Absolute: 0 10*3/uL (ref 0.0–0.2)
Basos: 0 %
EOS (ABSOLUTE): 0.1 10*3/uL (ref 0.0–0.4)
Eos: 1 %
Hematocrit: 40.7 % (ref 37.5–51.0)
Hemoglobin: 14.2 g/dL (ref 13.0–17.7)
Lymphocytes Absolute: 0.8 10*3/uL (ref 0.7–3.1)
Lymphs: 8 %
MCH: 33.6 pg — ABNORMAL HIGH (ref 26.6–33.0)
MCHC: 34.9 g/dL (ref 31.5–35.7)
MCV: 96 fL (ref 79–97)
Monocytes Absolute: 0.8 10*3/uL (ref 0.1–0.9)
Monocytes: 8 %
Neutrophils Absolute: 8.5 10*3/uL — ABNORMAL HIGH (ref 1.4–7.0)
Neutrophils: 83 %
Platelets: 181 10*3/uL (ref 150–450)
RBC: 4.22 x10E6/uL (ref 4.14–5.80)
RDW: 13.6 % (ref 11.6–15.4)
WBC: 10.1 10*3/uL (ref 3.4–10.8)

## 2022-04-26 LAB — BASIC METABOLIC PANEL
BUN/Creatinine Ratio: 20 (ref 10–24)
BUN: 18 mg/dL (ref 8–27)
CO2: 27 mmol/L (ref 20–29)
Calcium: 9.6 mg/dL (ref 8.6–10.2)
Chloride: 98 mmol/L (ref 96–106)
Creatinine, Ser: 0.9 mg/dL (ref 0.76–1.27)
Glucose: 113 mg/dL — ABNORMAL HIGH (ref 70–99)
Potassium: 4.7 mmol/L (ref 3.5–5.2)
Sodium: 133 mmol/L — ABNORMAL LOW (ref 134–144)
eGFR: 89 mL/min/{1.73_m2} (ref >59)

## 2022-04-27 ENCOUNTER — Telehealth: Payer: Self-pay | Admitting: Cardiovascular Disease

## 2022-04-27 NOTE — Telephone Encounter (Signed)
Patient called to follow up on lab results. 

## 2022-04-27 NOTE — Telephone Encounter (Signed)
The patient has been notified of the result and verbalized understanding.  All questions (if any) were answered. Darrell Jewel, RN 04/27/2022 5:45 PM

## 2022-05-03 ENCOUNTER — Encounter (HOSPITAL_COMMUNITY): Payer: Self-pay | Admitting: Licensed Clinical Social Worker

## 2022-05-03 ENCOUNTER — Ambulatory Visit (INDEPENDENT_AMBULATORY_CARE_PROVIDER_SITE_OTHER): Payer: Medicare Other | Admitting: Licensed Clinical Social Worker

## 2022-05-03 DIAGNOSIS — F3162 Bipolar disorder, current episode mixed, moderate: Secondary | ICD-10-CM | POA: Diagnosis not present

## 2022-05-03 NOTE — Progress Notes (Signed)
Virtual Visit via Telephone Note  I connected with [name] on 05/03/22 at  2:30 PM EDT by telephone and verified that I am speaking with the correct person using two identifiers.  Location: Patient: home Provider: home office   I discussed the limitations, risks, security and privacy concerns of performing an evaluation and management service by telephone and the availability of in person appointments. I also discussed with the patient that there may be a patient responsible charge related to this service. The patient expressed understanding and agreed to proceed.    I discussed the assessment and treatment plan with the patient. The patient was provided an opportunity to ask questions and all were answered. The patient agreed with the plan and demonstrated an understanding of the instructions.   The patient was advised to call back or seek an in-person evaluation if the symptoms worsen or if the condition fails to improve as anticipated.  I provided 45 minutes of non-face-to-face time during this encounter.   [name], LCSW   THERAPIST PROGRESS NOTE  Session Time: 2:30pm-3:15pm  Participation Level: Active  Behavioral Response: NAAlertDepressed  Type of Therapy: Individual Therapy  Treatment Goals addressed:  I'm really worried about my sleep issues and how they are effecting my memory and my mind, having more hallucinations, feeling out of it". [name] will improve psychological functioning as evidenced by reduced experience of psychoses, improvement in sleep, and more stabilized mood 3 out of 7 days.  ProgressTowards Goals: Progressing  Interventions: CBT  Summary: GOVERNOR MATOS is a 76 y.o. male who presents with Bipolar I disorder, mixed, moderate.   Suicidal/Homicidal: Nowithout intent/plan  Therapist Response: [name] engaged well in individual virtual session with Pension scheme manager. Clinician utilized CBT to process thoughts, feelings, and behaviors. [name] shared  ongoing struggles with son and sister. Clinician provided time and space for [name] to share updates, reflected frustration and sadness, as well as provided reality testing about past bxs. Clinician noted that they have not changed and likely will not change. However, clinician challenged [name] to change his expectations of them. Clinician noted that son and daughter-in-law have not wanted to be dependable for him or they would be. Clinician also noted that like them, his sister does not want to be a reliable support. However, his neighbors have been wonderful and they are willing to help in any way. Clinician identified the importance of gratitude to those who are present in order to keep them close.   Plan: Return again in 2 weeks.  Diagnosis: Bipolar 1 disorder, mixed, moderate ([place])  Collaboration of Care: Other none required  Patient/Guardian was advised Release of Information must be obtained prior to any record release in order to collaborate their care with an outside provider. Patient/Guardian was advised if they have not already done so to contact the registration department to sign all necessary forms in order for [place] to release information regarding their care.   Consent: Patient/Guardian gives verbal consent for treatment and assignment of benefits for services provided during this visit. Patient/Guardian expressed understanding and agreed to proceed.   Jobe Marker [place], LCSW 05/03/2022

## 2022-05-09 ENCOUNTER — Telehealth: Payer: Self-pay | Admitting: Cardiology

## 2022-05-09 ENCOUNTER — Telehealth (HOSPITAL_COMMUNITY): Payer: Self-pay | Admitting: *Deleted

## 2022-05-09 MED ORDER — RIVAROXABAN 20 MG PO TABS: 20.0000 mg | ORAL_TABLET | Freq: Every day | ORAL | 11 refills | Status: DC

## 2022-05-09 NOTE — Telephone Encounter (Signed)
Per MD discontinued Eliquis and started Xarelto 20 mg tablet daily, will clear for a clot tomorrow with CT and move forward as planned if scan looks okay.  Patient verbalized understanding.

## 2022-05-09 NOTE — Telephone Encounter (Signed)
Pt c/o medication issue:  1. Name of Medication:  apixaban (ELIQUIS) 5 MG TABS tablet  2. How are you currently taking this medication (dosage and times per day)? 2 x's daily   3. Are you having a reaction (difficulty breathing--STAT)?   4. What is your medication issue?  Pt is having procedure done 09/26 and was suppose to CT tomorrow, but believes he may have taken this medication incorrectly before the procedure and is requesting call back to discuss.

## 2022-05-09 NOTE — Telephone Encounter (Signed)
Patient states he takes his Eliquis every morning and most of the time he misses the second dose. Only takes the second dose two to three times a week. Notes it has always been this way for him, relating when he took his plavix. Advised, will check with MD and call him back

## 2022-05-09 NOTE — Telephone Encounter (Signed)
Reaching out to patient to offer assistance regarding upcoming cardiac imaging study; pt verbalizes understanding of appt date/time, parking situation and where to check in, medications ordered, and verified current allergies; name and call back number provided for further questions should they arise  Mikisha Roseland RN Navigator Cardiac Imaging [facility] [phone] office [phone] cell  Patient to take 100mg metoprolol tartrate two hours prior to his cardiac CT scan. 

## 2022-05-10 ENCOUNTER — Ambulatory Visit (HOSPITAL_BASED_OUTPATIENT_CLINIC_OR_DEPARTMENT_OTHER)
Admission: RE | Admit: 2022-05-10 | Discharge: 2022-05-10 | Disposition: A | Discharge status: Home / Self Care | Payer: Medicare Other | Source: Ambulatory Visit | Attending: Cardiology | Admitting: Cardiology

## 2022-05-10 DIAGNOSIS — I4891 Unspecified atrial fibrillation: Secondary | ICD-10-CM | POA: Insufficient documentation

## 2022-05-10 MED ORDER — IOHEXOL 350 MG/ML SOLN
100.0000 mL | Freq: Once | INTRAVENOUS | Status: AC | PRN
  Administered 2022-05-10: 80 mL via INTRAVENOUS

## 2022-05-12 ENCOUNTER — Other Ambulatory Visit: Payer: Self-pay | Admitting: Internal Medicine

## 2022-05-13 ENCOUNTER — Ambulatory Visit: Payer: Medicare Other | Admitting: Internal Medicine

## 2022-05-13 ENCOUNTER — Encounter: Payer: Self-pay | Admitting: Internal Medicine

## 2022-05-13 VITALS — BP 128/72 | HR 75 | Temp 98.1°F | Ht 71.0 in | Wt 173.0 lb

## 2022-05-13 DIAGNOSIS — R739 Hyperglycemia, unspecified: Secondary | ICD-10-CM | POA: Diagnosis not present

## 2022-05-13 DIAGNOSIS — G44311 Acute post-traumatic headache, intractable: Secondary | ICD-10-CM | POA: Diagnosis not present

## 2022-05-13 DIAGNOSIS — I10 Essential (primary) hypertension: Secondary | ICD-10-CM

## 2022-05-13 NOTE — Assessment & Plan Note (Signed)
Unfortunate recent fall x 3 days ago likely due to vasovagal syncope, now with persistent HA without other neuro s/s, while on xarelto, delcines ED visit for now  Pt now for Head CT stat - r/o SDH or other bleeding intracranial

## 2022-05-13 NOTE — Assessment & Plan Note (Signed)
Lab Results  Component Value Date   HGBA1C 4.6 10/12/2021   Stable, pt to continue current medical treatment  - diet, wt control, excercise

## 2022-05-13 NOTE — Progress Notes (Addendum)
Patient ID: Justin [name], male   DOB: [birth year], 76 y.o.   MRN: [id number]        Chief Complaint: follow up left occiput pain after fall       HPI:  Justin [name] is a 76 y.o. male here with c/o fall with LOC on tues sept 19; history begins with trying to drink more fluids than usual due to having a CT with contrast for cardiology purpose, as is currently scheduled for Afib ablation on Tues Sept 26.  Drinking hard and fast he developed recurring episodes of squeezing mid chest pain with radiation, once with n/v over the next 2 days.  Kept trying to drink more fluids however, and with the last episode drinking and squeezing CP he lost consciousness and fell.  Woke up in minutes, with a sore swelling area to the left occiput (no laceration, overall now improved) and left knee soreness.  Did not seek medical attention, and refuses ED now.  Unfortunately since the fall he has persistent headache mostly the post head but wraps around both sides towards the temples.  Pt denies other chest pain, increased sob or doe, wheezing, orthopnea, PND, increased LE swelling, palpitations.   Pt denies polydipsia, polyuria, or new focal neuro s/s. Pt was previously on Eliquis but kept missing PM doses so pt changed Mon Sept 25 to Xarelto with good compliance       Wt Readings from Last 3 Encounters:  05/13/22 173 lb (78.5 kg)  02/03/22 172 lb (78 kg)  02/02/22 170 lb 6.4 oz (77.3 kg)   BP Readings from Last 3 Encounters:  05/13/22 128/72  02/03/22 (!) 148/76  02/02/22 138/82         Past Medical History:  Diagnosis Date   Anxiety and depression    Aortic insufficiency 03/11/2022   Echocardiogram 02/2022: EF 70-75, no RWMA, moderate LVH, normal RVSF, normal PASP (RVSP 20), moderate LAE, mild MR, moderate AI, AV sclerosis without stenosis, no aortic root or ascending aorta dilation   Arthritis    knees, c-spine // gets lumbar ESI q 6 mos   Bipolar disorder (HCC)    CAD (coronary artery disease)    [place] 08/2009  >> [facility] ([facility]) - mLAD 70, Dx 65 >> PCI:  3x15 mm Endeavor DES to mLAD and 2.5x12 mm Endeavor DES to Dx // S/p NSTEMI >> [facility] 8/12 ([facility]) - LM ok, LAD prox and mid 40; LAD stent ok, Dx stent ok, mRCA 30, mLCx 50 >> med Rx // ETT-Echo 2/17 ([facility]): Normal, EF 55-60 at rest   Chicken pox    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[facility])    Grief    History of echocardiogram    Echo 3/19: Mild concentric LVH, EF 60-65, normal wall motion, grade 1 diastolic dysfunction, mild AI, mildly dilated aortic root (40 mm), MAC, mild LAE, atrial septal lipomatous hypertrophy // Echo 8/22: EF 55-60, no RWMA, mild LVH, GRII DD, normal RVSF, RVSP 40, mild LAE, trivial MR, mild-moderate AI, AV sclerosis without stenosis, mild dilation of aortic root (39 mm)   History of non-ST elevation myocardial infarction (NSTEMI)    History of nuclear stress test    Nuclear stress test 3/19: EF 57, inferior/inferoseptal/inferolateral defect consistent with probable soft tissue attenuation (cannot exclude subendocardial scar), no ischemia, intermediate risk >> Echo 3/19 normal EF, normal wall motion   History of pneumonia 2011   History of prostatitis 1990s   Hx of adenomatous colonic polyps 06/22/2016   Hyperlipidemia  Hypertension    Migraines    prior history   PTSD (post-traumatic stress disorder)    Past Surgical History:  Procedure Laterality Date   CARDIOVERSION N/A 08/05/2021   Procedure: CARDIOVERSION;  Surgeon: Nahser, Wonda Cheng, MD;  Location: [facility];  Service: Cardiovascular;  Laterality: N/A;   CERVICAL DISCECTOMY     COLONOSCOPY  03/2017   ELBOW SURGERY Left    ELBOW SURGERY Right    INGUINAL HERNIA REPAIR Bilateral    1996, 1997   KNEE ARTHROSCOPY Right    KNEE CARTILAGE SURGERY Left    NASAL SEPTUM SURGERY     STENT PLACEMENT VASCULAR ([facility])  09/02/2010   TONSILLECTOMY     TOTAL KNEE ARTHROPLASTY Left 08/31/2018   Procedure: TOTAL KNEE ARTHROPLASTY;  Surgeon: Dorna Leitz, MD;  Location: [facility];  Service: Orthopedics;  Laterality: Left;    reports that he has never smoked. He has never used smokeless tobacco. He reports that he does not currently use alcohol after a past usage of about 6.0 standard drinks of alcohol per week. He reports that he does not use drugs. family history includes Alzheimer's disease in his mother; Arthritis in his father and mother; Depression in his brother; Diabetes in his sister and sister; Heart attack in his father, mother, and sister; Heart disease in his father and mother; Hyperlipidemia in his father; Hypertension in his father and mother; Lung cancer in his maternal aunt, paternal aunt, and paternal grandmother; Multiple sclerosis in his sister; Pulmonary embolism in his sister; Stroke in his father. Allergies  Allergen Reactions   Ambien [Zolpidem Tartrate] Other (See Comments)    Blackout, memory issues   Codeine     Other reaction(s): Other (See Comments) Memory issues Other reaction(s): Other Memory issues   Ativan [Lorazepam] Other (See Comments)    Made him feel completely out of it fell down   Seroquel [Quetiapine]     SEVERE NIGHTMARES, SLEEPWALK AND NIGHT DRIVE WITH NO RECOLLECTION UPON WAKENING   Current Outpatient Medications on File Prior to Visit  Medication Sig Dispense Refill   ALPRAZolam (XANAX) 1 MG tablet TAKE 1/2 TO 1 TABLET BY MOUTH ONCE DAILY (Patient taking differently: Take 0.5-1 mg by mouth daily as needed for anxiety.) 30 tablet 2   diltiazem (CARDIZEM CD) 180 MG 24 hr capsule TAKE 1 CAPSULE BY MOUTH DAILY 30 capsule 11   isosorbide mononitrate (IMDUR) 30 MG 24 hr tablet TAKE 1 TABLET BY MOUTH DAILY 15 tablet 0   lamoTRIgine (LAMICTAL) 200 MG tablet Take 1 tablet (200 mg total) by mouth daily. For mood control 30 tablet 2   lamoTRIgine (LAMICTAL) 25 MG tablet Take one tablet (25 mg) every night at bedtime. 30 tablet 2   metoprolol tartrate (LOPRESSOR) 100 MG tablet Take 1 tablet (118m) 2 hours prior to your CT Scan 1  tablet 0   nitroGLYCERIN (NITROSTAT) 0.4 MG SL tablet Place 1 tablet (0.4 mg total) under the tongue every 5 (five) minutes as needed for chest pain. 25 tablet 3   predniSONE (DELTASONE) 10 MG tablet 3 tabs by mouth per day for 3 days,2tabs per day for 3 days,1tab per day for 3 days 18 tablet 0   rivaroxaban (XARELTO) 20 MG TABS tablet Take 1 tablet (20 mg total) by mouth daily. 30 tablet 11   rosuvastatin (CRESTOR) 20 MG tablet TAKE 1 TABLET BY MOUTH DAILY 15 tablet 0   No current facility-administered medications on file prior to visit.  ROS:  All others reviewed and negative.  Objective        PE:  BP 128/72 (BP Location: Right Arm, Patient Position: Sitting, Cuff Size: Large)   Pulse 75   Temp 98.1 F (36.7 C) (Oral)   Ht _0  (1.803 m)   Wt 173 lb (78.5 kg)   SpO2 95%   BMI 24.13 kg/m                 Constitutional: Pt appears in NAD               HENT: Head: NCAT. Except left occiput area with 1 cm area mild tender, swelling, no laceration or redness               Right Ear: External ear normal.                 Left Ear: External ear normal.                Eyes: . Pupils are equal, round, and reactive to light. Conjunctivae and EOM are normal               Nose: without d/c or deformity               Neck: Neck supple. Gross normal ROM               Cardiovascular: Normal rate and regular rhythm.                 Pulmonary/Chest: Effort normal and breath sounds without rales or wheezing.                Abd:  Soft, NT, ND, + BS, no organomegaly               Neurological: Pt is alert. At baseline orientation, motor grossly intact, cn 2-12 intact               Skin: Skin is warm. No rashes, no other new lesions, LE edema - none               Psychiatric: Pt behavior is normal without agitation   Micro: none  Cardiac tracings I have personally interpreted today:  none  Pertinent Radiological findings (summarize): none   Lab Results  Component Value Date   WBC  10.1 04/26/2022   HGB 14.2 04/26/2022   HCT 40.7 04/26/2022   PLT 181 04/26/2022   GLUCOSE 113 (H) 04/26/2022   CHOL 137 10/12/2021   TRIG 44.0 10/12/2021   HDL 77.80 10/12/2021   LDLCALC 50 10/12/2021   ALT 19 10/12/2021   AST 25 10/12/2021   NA 133 (L) 04/26/2022   K 4.7 04/26/2022   CL 98 04/26/2022   CREATININE 0.90 04/26/2022   BUN 18 04/26/2022   CO2 27 04/26/2022   TSH 0.63 10/12/2021   PSA 2.17 10/12/2021   INR 0.95 08/21/2018   HGBA1C 4.6 10/12/2021   MICROALBUR 1.0 10/12/2021   Assessment/Plan:  Justin [name] is a 76 y.o. White or Caucasian [1] male with  has a past medical history of Anxiety and depression, Aortic insufficiency (03/11/2022), Arthritis, Bipolar disorder ([place]), CAD (coronary artery disease), Chicken pox, 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[place]), Grief, History of echocardiogram, History of non-ST elevation myocardial infarction (NSTEMI), History of nuclear stress test, History of pneumonia (2011), History of prostatitis (1990s), adenomatous colonic polyps (06/22/2016), Hyperlipidemia, Hypertension, Migraines, and PTSD (post-traumatic stress disorder).  Intractable acute post-traumatic headache Unfortunate recent  fall x 3 days ago likely due to vasovagal syncope, now with persistent HA without other neuro s/s, while on xarelto, delcines ED visit for now  Pt now for Head CT stat - r/o SDH or other bleeding intracranial  Hyperglycemia Lab Results  Component Value Date   HGBA1C 4.6 10/12/2021   Stable, pt to continue current medical treatment  - diet, wt control, excercise   Hypertension BP Readings from Last 3 Encounters:  05/13/22 128/72  02/03/22 (!) 148/76  02/02/22 138/82   Stable, pt to continue medical treatment cardizem cd 180  Followup: No follow-ups on file.  Cathlean Cower, MD 05/13/2022 3:43 PM [facility]

## 2022-05-13 NOTE — Patient Instructions (Signed)
You will be contacted regarding the referral for: CT scan of the head  Please continue all other medications as before, for now  Please have the pharmacy call with any other refills you may need  Please keep your appointments with your specialists as you may have planned  Please go to ED for any worsening HA, vision changes, weakness, falls or other unusual symptoms

## 2022-05-13 NOTE — Assessment & Plan Note (Signed)
BP Readings from Last 3 Encounters:  05/13/22 128/72  02/03/22 (!) 148/76  02/02/22 138/82   Stable, pt to continue medical treatment cardizem cd 180

## 2022-05-16 ENCOUNTER — Telehealth: Payer: Self-pay | Admitting: Cardiology

## 2022-05-16 ENCOUNTER — Ambulatory Visit (HOSPITAL_COMMUNITY)
Admission: RE | Admit: 2022-05-16 | Discharge: 2022-05-16 | Disposition: A | Discharge status: Home / Self Care | Payer: Medicare Other | Source: Ambulatory Visit | Attending: Internal Medicine | Admitting: Internal Medicine

## 2022-05-16 DIAGNOSIS — G44311 Acute post-traumatic headache, intractable: Secondary | ICD-10-CM | POA: Insufficient documentation

## 2022-05-16 NOTE — Pre-Procedure Instructions (Signed)
Instructed patient on the following items: Arrival time 0830 Nothing to eat or drink after midnight No meds AM of procedure He does not have anyone that can stay overnight with him and doesn't have anyone that he can stay with.  We will keep overnight.    Have you missed any doses of anti-coagulant Xarelto-hasn't missed any doses.

## 2022-05-16 NOTE — Telephone Encounter (Signed)
Patient calling in with questions concerning his procedure on tomorrow. Unsure if he is suppose to take his medication for tomorrow. Please advise

## 2022-05-16 NOTE — Telephone Encounter (Signed)
Call transferred in.    Patient confirmed he had his procedure instruction. Answered all questions.

## 2022-05-16 NOTE — Telephone Encounter (Signed)
Left a message that I would forward instruction over mychart. Call back with any further questions.

## 2022-05-17 ENCOUNTER — Encounter (HOSPITAL_COMMUNITY)
Admission: RE | Disposition: A | Discharge status: Home / Self Care | Payer: Self-pay | Source: Home / Self Care | Attending: Cardiology

## 2022-05-17 ENCOUNTER — Ambulatory Visit (HOSPITAL_BASED_OUTPATIENT_CLINIC_OR_DEPARTMENT_OTHER): Payer: Medicare Other | Admitting: Certified Registered"

## 2022-05-17 ENCOUNTER — Other Ambulatory Visit (HOSPITAL_COMMUNITY): Payer: Self-pay

## 2022-05-17 ENCOUNTER — Ambulatory Visit (HOSPITAL_COMMUNITY): Payer: Medicare Other | Admitting: Certified Registered"

## 2022-05-17 ENCOUNTER — Encounter (HOSPITAL_COMMUNITY): Payer: Self-pay | Admitting: Cardiology

## 2022-05-17 ENCOUNTER — Other Ambulatory Visit: Payer: Self-pay

## 2022-05-17 ENCOUNTER — Ambulatory Visit (HOSPITAL_COMMUNITY)
Admission: RE | Admit: 2022-05-17 | Discharge: 2022-05-18 | Disposition: A | Discharge status: Home / Self Care | Payer: Medicare Other | Attending: Cardiology | Admitting: Cardiology

## 2022-05-17 DIAGNOSIS — R1030 Lower abdominal pain, unspecified: Secondary | ICD-10-CM | POA: Insufficient documentation

## 2022-05-17 DIAGNOSIS — F319 Bipolar disorder, unspecified: Secondary | ICD-10-CM | POA: Insufficient documentation

## 2022-05-17 DIAGNOSIS — R002 Palpitations: Secondary | ICD-10-CM | POA: Diagnosis not present

## 2022-05-17 DIAGNOSIS — E785 Hyperlipidemia, unspecified: Secondary | ICD-10-CM | POA: Diagnosis not present

## 2022-05-17 DIAGNOSIS — R1900 Intra-abdominal and pelvic swelling, mass and lump, unspecified site: Secondary | ICD-10-CM | POA: Diagnosis not present

## 2022-05-17 DIAGNOSIS — I2510 Atherosclerotic heart disease of native coronary artery without angina pectoris: Secondary | ICD-10-CM | POA: Diagnosis not present

## 2022-05-17 DIAGNOSIS — I10 Essential (primary) hypertension: Secondary | ICD-10-CM | POA: Insufficient documentation

## 2022-05-17 DIAGNOSIS — R5383 Other fatigue: Secondary | ICD-10-CM | POA: Diagnosis not present

## 2022-05-17 DIAGNOSIS — I4819 Other persistent atrial fibrillation: Secondary | ICD-10-CM | POA: Insufficient documentation

## 2022-05-17 DIAGNOSIS — R0600 Dyspnea, unspecified: Secondary | ICD-10-CM | POA: Insufficient documentation

## 2022-05-17 DIAGNOSIS — I25119 Atherosclerotic heart disease of native coronary artery with unspecified angina pectoris: Secondary | ICD-10-CM | POA: Insufficient documentation

## 2022-05-17 DIAGNOSIS — I4891 Unspecified atrial fibrillation: Secondary | ICD-10-CM | POA: Diagnosis not present

## 2022-05-17 DIAGNOSIS — I252 Old myocardial infarction: Secondary | ICD-10-CM | POA: Diagnosis not present

## 2022-05-17 DIAGNOSIS — F4310 Post-traumatic stress disorder, unspecified: Secondary | ICD-10-CM | POA: Insufficient documentation

## 2022-05-17 DIAGNOSIS — Z7901 Long term (current) use of anticoagulants: Secondary | ICD-10-CM | POA: Insufficient documentation

## 2022-05-17 DIAGNOSIS — F418 Other specified anxiety disorders: Secondary | ICD-10-CM

## 2022-05-17 HISTORY — PX: ATRIAL FIBRILLATION ABLATION: EP1191

## 2022-05-17 LAB — POCT ACTIVATED CLOTTING TIME
Activated Clotting Time: 275 seconds
Activated Clotting Time: 311 seconds

## 2022-05-17 SURGERY — ATRIAL FIBRILLATION ABLATION
Anesthesia: General

## 2022-05-17 MED ORDER — SUGAMMADEX SODIUM 200 MG/2ML IV SOLN
INTRAVENOUS | Status: DC | PRN
  Administered 2022-05-17: 200 mg via INTRAVENOUS

## 2022-05-17 MED ORDER — MELATONIN 3 MG PO TABS
3.0000 mg | ORAL_TABLET | Freq: Every day | ORAL | Status: DC
  Administered 2022-05-17: 3 mg via ORAL
  Filled 2022-05-17: qty 1

## 2022-05-17 MED ORDER — FENTANYL CITRATE (PF) 250 MCG/5ML IJ SOLN
INTRAMUSCULAR | Status: DC | PRN
  Administered 2022-05-17: 100 ug via INTRAVENOUS

## 2022-05-17 MED ORDER — HEPARIN SODIUM (PORCINE) 1000 UNIT/ML IJ SOLN
INTRAMUSCULAR | Status: DC | PRN
  Administered 2022-05-17: 1000 [IU] via INTRAVENOUS

## 2022-05-17 MED ORDER — ACETAMINOPHEN 325 MG PO TABS
650.0000 mg | ORAL_TABLET | ORAL | Status: DC | PRN
  Administered 2022-05-17 (×3): 650 mg via ORAL
  Filled 2022-05-17 (×3): qty 2

## 2022-05-17 MED ORDER — ONDANSETRON HCL 4 MG/2ML IJ SOLN: 4.0000 mg | Freq: Four times a day (QID) | INTRAMUSCULAR | Status: DC | PRN

## 2022-05-17 MED ORDER — OXYCODONE HCL 5 MG/5ML PO SOLN: 5.0000 mg | Freq: Once | ORAL | Status: DC | PRN

## 2022-05-17 MED ORDER — ONDANSETRON HCL 4 MG/2ML IJ SOLN
INTRAMUSCULAR | Status: DC | PRN
  Administered 2022-05-17: 4 mg via INTRAVENOUS

## 2022-05-17 MED ORDER — HEPARIN SODIUM (PORCINE) 1000 UNIT/ML IJ SOLN
INTRAMUSCULAR | Status: DC | PRN
  Administered 2022-05-17 (×2): 5000 [IU] via INTRAVENOUS
  Administered 2022-05-17: 13000 [IU] via INTRAVENOUS

## 2022-05-17 MED ORDER — DILTIAZEM HCL ER COATED BEADS 180 MG PO CP24
180.0000 mg | ORAL_CAPSULE | Freq: Every day | ORAL | Status: DC
  Administered 2022-05-18: 180 mg via ORAL
  Filled 2022-05-17: qty 1

## 2022-05-17 MED ORDER — COLCHICINE 0.6 MG PO TABS
0.6000 mg | ORAL_TABLET | Freq: Two times a day (BID) | ORAL | Status: DC
  Administered 2022-05-17 – 2022-05-18 (×3): 0.6 mg via ORAL
  Filled 2022-05-17 (×3): qty 1

## 2022-05-17 MED ORDER — MIDAZOLAM HCL 2 MG/2ML IJ SOLN: 0.5000 mg | Freq: Once | INTRAMUSCULAR | Status: DC | PRN

## 2022-05-17 MED ORDER — SODIUM CHLORIDE 0.9% FLUSH
3.0000 mL | Freq: Two times a day (BID) | INTRAVENOUS | Status: DC
  Administered 2022-05-17 – 2022-05-18 (×2): 3 mL via INTRAVENOUS

## 2022-05-17 MED ORDER — SODIUM CHLORIDE 0.9 % IV SOLN: INTRAVENOUS | Status: DC

## 2022-05-17 MED ORDER — SODIUM CHLORIDE 0.9 % IV SOLN: 250.0000 mL | INTRAVENOUS | Status: DC | PRN

## 2022-05-17 MED ORDER — PROMETHAZINE HCL 25 MG/ML IJ SOLN: 6.2500 mg | INTRAMUSCULAR | Status: DC | PRN

## 2022-05-17 MED ORDER — OXYCODONE HCL 5 MG PO TABS: 5.0000 mg | ORAL_TABLET | Freq: Once | ORAL | Status: DC | PRN

## 2022-05-17 MED ORDER — ROCURONIUM BROMIDE 10 MG/ML (PF) SYRINGE
PREFILLED_SYRINGE | INTRAVENOUS | Status: DC | PRN
  Administered 2022-05-17: 50 mg via INTRAVENOUS

## 2022-05-17 MED ORDER — ACETAMINOPHEN 500 MG PO TABS
1000.0000 mg | ORAL_TABLET | Freq: Once | ORAL | Status: AC
Start: 2022-05-17 — End: 2022-05-17
  Administered 2022-05-17: 1000 mg via ORAL
  Filled 2022-05-17: qty 2

## 2022-05-17 MED ORDER — COLCHICINE 0.6 MG PO TABS
0.6000 mg | ORAL_TABLET | Freq: Two times a day (BID) | ORAL | 0 refills | Status: DC
  Filled 2022-05-17: qty 10, 5d supply, fill #0

## 2022-05-17 MED ORDER — SODIUM CHLORIDE 0.9% FLUSH: 3.0000 mL | INTRAVENOUS | Status: DC | PRN

## 2022-05-17 MED ORDER — ROSUVASTATIN CALCIUM 20 MG PO TABS
20.0000 mg | ORAL_TABLET | Freq: Every day | ORAL | Status: DC
  Administered 2022-05-18: 20 mg via ORAL
  Filled 2022-05-17: qty 1

## 2022-05-17 MED ORDER — ISOSORBIDE MONONITRATE ER 30 MG PO TB24
30.0000 mg | ORAL_TABLET | Freq: Every day | ORAL | Status: DC
  Administered 2022-05-17 – 2022-05-18 (×2): 30 mg via ORAL
  Filled 2022-05-17 (×2): qty 1

## 2022-05-17 MED ORDER — PROTAMINE SULFATE 10 MG/ML IV SOLN
INTRAVENOUS | Status: DC | PRN
  Administered 2022-05-17: 30 mg via INTRAVENOUS

## 2022-05-17 MED ORDER — MEPERIDINE HCL 25 MG/ML IJ SOLN: 6.2500 mg | INTRAMUSCULAR | Status: DC | PRN

## 2022-05-17 MED ORDER — LIDOCAINE 2% (20 MG/ML) 5 ML SYRINGE
INTRAMUSCULAR | Status: DC | PRN
  Administered 2022-05-17: 40 mg via INTRAVENOUS

## 2022-05-17 MED ORDER — RIVAROXABAN 20 MG PO TABS
20.0000 mg | ORAL_TABLET | Freq: Every day | ORAL | Status: DC
  Administered 2022-05-17: 20 mg via ORAL
  Filled 2022-05-17: qty 1

## 2022-05-17 MED ORDER — LAMOTRIGINE 25 MG PO TABS
25.0000 mg | ORAL_TABLET | Freq: Every day | ORAL | Status: DC
  Administered 2022-05-17: 25 mg via ORAL
  Filled 2022-05-17: qty 1

## 2022-05-17 MED ORDER — PANTOPRAZOLE SODIUM 40 MG PO TBEC
40.0000 mg | DELAYED_RELEASE_TABLET | Freq: Every day | ORAL | Status: DC
  Administered 2022-05-17 – 2022-05-18 (×2): 40 mg via ORAL
  Filled 2022-05-17 (×2): qty 1

## 2022-05-17 MED ORDER — HEPARIN (PORCINE) IN NACL 1000-0.9 UT/500ML-% IV SOLN
INTRAVENOUS | Status: DC | PRN
  Administered 2022-05-17 (×3): 500 mL

## 2022-05-17 MED ORDER — PHENYLEPHRINE HCL-NACL 20-0.9 MG/250ML-% IV SOLN
INTRAVENOUS | Status: DC | PRN
  Administered 2022-05-17: 25 ug/min via INTRAVENOUS

## 2022-05-17 MED ORDER — FENTANYL CITRATE (PF) 100 MCG/2ML IJ SOLN: 25.0000 ug | INTRAMUSCULAR | Status: DC | PRN

## 2022-05-17 MED ORDER — PROPOFOL 10 MG/ML IV BOLUS
INTRAVENOUS | Status: DC | PRN
  Administered 2022-05-17: 80 mg via INTRAVENOUS

## 2022-05-17 MED ORDER — HEPARIN SODIUM (PORCINE) 1000 UNIT/ML IJ SOLN
INTRAMUSCULAR | Status: AC
  Filled 2022-05-17: qty 10

## 2022-05-17 MED ORDER — PANTOPRAZOLE SODIUM 40 MG PO TBEC
40.0000 mg | DELAYED_RELEASE_TABLET | Freq: Every day | ORAL | 0 refills | Status: DC
  Filled 2022-05-17: qty 45, 45d supply, fill #0

## 2022-05-17 MED ORDER — DEXAMETHASONE SODIUM PHOSPHATE 10 MG/ML IJ SOLN
INTRAMUSCULAR | Status: DC | PRN
  Administered 2022-05-17: 5 mg via INTRAVENOUS

## 2022-05-17 SURGICAL SUPPLY — 17 items
BAG SNAP BAND KOVER 36X36 (MISCELLANEOUS) IMPLANT
CATH ABLAT QDOT MICRO BI TC DF (CATHETERS) IMPLANT
CATH OCTARAY 2.0 F 3-3-3-3-3 (CATHETERS) IMPLANT
CATH S-M CIRCA TEMP PROBE (CATHETERS) IMPLANT
CATH SOUNDSTAR ECO 8FR (CATHETERS) IMPLANT
CATH WEB BI DIR CSDF CRV REPRO (CATHETERS) IMPLANT
CLOSURE PERCLOSE PROSTYLE (VASCULAR PRODUCTS) IMPLANT
COVER SWIFTLINK CONNECTOR (BAG) ×1 IMPLANT
PACK EP LATEX FREE (CUSTOM PROCEDURE TRAY) ×1
PACK EP LF (CUSTOM PROCEDURE TRAY) ×1 IMPLANT
PAD DEFIB RADIO PHYSIO CONN (PAD) ×1 IMPLANT
SHEATH BAYLIS TRANSSEPTAL 98CM (NEEDLE) IMPLANT
SHEATH CARTO VIZIGO SM CVD (SHEATH) IMPLANT
SHEATH PINNACLE 8F 10CM (SHEATH) IMPLANT
SHEATH PINNACLE 9F 10CM (SHEATH) IMPLANT
SHEATH PROBE COVER 6X72 (BAG) IMPLANT
TUBING SMART ABLATE COOLFLOW (TUBING) IMPLANT

## 2022-05-17 NOTE — Anesthesia Preprocedure Evaluation (Addendum)
Anesthesia Evaluation  Patient identified by MRN, date of birth, ID band Patient awake    Reviewed: Allergy & Precautions, NPO status , Patient's Chart, lab work & pertinent test results, reviewed documented beta blocker date and time   History of Anesthesia Complications Negative for: history of anesthetic complications  Airway Mallampati: II  TM Distance: >3 FB Neck ROM: Full    Dental  (+) Poor Dentition, Missing, Dental Advisory Given, Chipped   Pulmonary neg pulmonary ROS,    breath sounds clear to auscultation       Cardiovascular hypertension, Pt. on medications and Pt. on home beta blockers (-) angina+ CAD and + Cardiac Stents  + dysrhythmias Atrial Fibrillation  Rhythm:Irregular Rate:Normal  02/2022 ECHO: EF 70 to 75%. The LV has hyperdynamic function, no regional wall motion abnormalities. There is moderate LVH of the basal-septal segment, normal RVF, mild MR, mod AI with no AS  02/2021 Stress: Lexiscan stress is electrically negative for ischemia Myoview scan shows probable normal perfusion and soft tissue attenuation (diaphragm, bowel activity) No significant ischemia or scar LVEF calculated at 64% Low risk study.    Neuro/Psych  Headaches, Anxiety Depression Bipolar Disorder    GI/Hepatic negative GI ROS, Neg liver ROS,   Endo/Other  negative endocrine ROS  Renal/GU negative Renal ROS     Musculoskeletal   Abdominal   Peds  Hematology xarelto   Anesthesia Other Findings   Reproductive/Obstetrics                            Anesthesia Physical Anesthesia Plan  ASA: 3  Anesthesia Plan: General   Post-op Pain Management: Tylenol PO (pre-op)*   Induction: Intravenous  PONV Risk Score and Plan: 2 and Ondansetron and Dexamethasone  Airway Management Planned: Oral ETT  Additional Equipment: None  Intra-op Plan:   Post-operative Plan: Extubation in OR  Informed Consent: I  have reviewed the patients History and Physical, chart, labs and discussed the procedure including the risks, benefits and alternatives for the proposed anesthesia with the patient or authorized representative who has indicated his/her understanding and acceptance.     Dental advisory given  Plan Discussed with: CRNA and Surgeon  Anesthesia Plan Comments:        Anesthesia Quick Evaluation

## 2022-05-17 NOTE — Anesthesia Procedure Notes (Signed)
Procedure Name: Intubation Date/Time: 05/17/2022 9:59 AM  Performed by: Gaylene Brooks, CRNAPre-anesthesia Checklist: Patient identified, Emergency Drugs available, Suction available and Patient being monitored Patient Re-evaluated:Patient Re-evaluated prior to induction Oxygen Delivery Method: Circle System Utilized Preoxygenation: Pre-oxygenation with 100% oxygen Induction Type: IV induction Ventilation: Mask ventilation without difficulty and Two handed mask ventilation required Laryngoscope Size: Miller and 2 Grade View: Grade II Tube type: Oral Tube size: 7.5 mm Number of attempts: 1 Airway Equipment and Method: Stylet Placement Confirmation: ETT inserted through vocal cords under direct vision, positive ETCO2 and breath sounds checked- equal and bilateral Secured at: 23 cm Tube secured with: Tape Dental Injury: Teeth and Oropharynx as per pre-operative assessment

## 2022-05-17 NOTE — H&P (Signed)
Electrophysiology Office Note:     Date:  05/17/2022    ID:  CRIST KRUSZKA, DOB 09-Oct-1945, MRN [id number]   PCP:  Biagio Borg, MD  [facility] Cardiologist:  Sherren Mocha, MD  [facility] Electrophysiologist:  Vickie Epley, MD    Referring MD: Oliver Barre, [name]    Chief Complaint: New patient consult for Ablation   History of Present Illness:     Justin [name] is a 76 y.o. male who presents for an evaluation for atrial fibrillation ablation at the request of [name], [name]. Their medical history includes NSTEMI, CAD, hypertension, hyperlipidemia, pneumonia, arthritis, dissociative amnesia, bipolar disorder, depression and PTSD.   He saw [name], PA on 08/18/2021 and was s/p DCCV on 08/05/2021. He was found to be back in rate controlled atrial fibrillation, on diltiazem and Eliquis. Would avoid class IC with h/o CAD, can not use dofetilide with lamotrigine. He was not interested in repeat DCCV. After discussion of Multaq, amiodarone, and ablation, he was agreeable to a consultation with EP.   Today, he confirms that he had initially felt like his Afib had returned immediately after the DCCV, but this feeling did not last long.   Every so often he feels skipped beats associated with a quick, stabbing chest pain. His chest pain has been intermittent and is worse today. He becomes short of breath easily, and very fatigued consistently.    Lately he has not been active like he used to be due to an ankle surgery last October. Unfortunately this resulted in nerve damage. With or without activity, he feels limited by his ankle pain.   He denies any peripheral edema. No lightheadedness, headaches, syncope, orthopnea, or PND.       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[place])     Grief     History of echocardiogram      Echo 3/19: Mild concentric LVH, EF 60-65, normal wall motion, grade 1 diastolic dysfunction, mild AI, mildly dilated aortic root (40 mm), MAC, mild LAE, atrial septal lipomatous hypertrophy // Echo 8/22: EF 55-60, no RWMA, mild LVH, GRII DD, normal RVSF, RVSP 40, mild LAE, trivial MR, mild-moderate AI, AV sclerosis without stenosis, mild dilation of aortic root (39 mm)   History of non-ST elevation myocardial infarction (NSTEMI)     History of nuclear stress test      Nuclear stress test 3/19: EF 57, inferior/inferoseptal/inferolateral defect consistent with probable soft tissue attenuation (cannot exclude subendocardial scar), no ischemia, intermediate risk >> Echo 3/19 normal EF, normal wall motion   History of pneumonia 2011   History of prostatitis 1990s   Hx of adenomatous colonic polyps 06/22/2016   Hyperlipidemia     Hypertension     Migraines  prior history   PTSD (post-traumatic stress disorder)             Past Surgical History:  Procedure Laterality Date   CARDIOVERSION N/A 08/05/2021    Procedure: CARDIOVERSION;  Surgeon: Nahser, Wonda Cheng, MD;  Location: [facility];  Service: Cardiovascular;  Laterality: N/A;   CERVICAL DISCECTOMY       COLONOSCOPY   03/2017   ELBOW SURGERY Left     ELBOW SURGERY Right     INGUINAL HERNIA REPAIR Bilateral      1996, 1997   KNEE ARTHROSCOPY Right     KNEE CARTILAGE SURGERY Left     NASAL SEPTUM SURGERY       STENT PLACEMENT VASCULAR ([place] [facility])   09/02/2010   TONSILLECTOMY       TOTAL KNEE ARTHROPLASTY Left 08/31/2018    Procedure: TOTAL KNEE ARTHROPLASTY;  Surgeon: Dorna Leitz,  MD;  Location: [facility];  Service: Orthopedics;  Laterality: Left;      Current Medications: Active Medications      Current Meds  Medication Sig   ALPRAZolam (XANAX) 1 MG tablet TAKE 1/2 TO 1 TABLET BY MOUTH ONCE DAILY   apixaban (ELIQUIS) 5 MG TABS tablet Take 1 tablet (5 mg total) by mouth 2 (two) times daily.   diltiazem (CARDIZEM CD) 180 MG 24 hr capsule TAKE 1 CAPSULE BY MOUTH DAILY   isosorbide mononitrate (IMDUR) 30 MG 24 hr tablet Take 1 tablet (30 mg total) by mouth daily.   lamoTRIgine (LAMICTAL) 200 MG tablet Take 1 tablet (200 mg total) by mouth daily. For mood control   lamoTRIgine (LAMICTAL) 25 MG tablet Take one tab twice daily with 200 mg daily Lamictal.   nitroGLYCERIN (NITROSTAT) 0.4 MG SL tablet Place 1 tablet (0.4 mg total) under the tongue every 5 (five) minutes as needed for chest pain.   predniSONE (DELTASONE) 10 MG tablet 3 tabs by mouth per day for 3 days,2tabs per day for 3 days,1tab per day for 3 days   rosuvastatin (CRESTOR) 20 MG tablet One tablet by mouth ( 20 mg) daily.        Allergies:   Ambien [zolpidem tartrate], Codeine, Ativan [lorazepam], and Seroquel [quetiapine]    Social History         Socioeconomic History   Marital status: Widowed      Spouse name: Not on file   Number of children: 2   Years of education: 75   Highest education level: Not on file  Occupational History   Occupation: Retired  Tobacco Use   Smoking status: Never   Smokeless tobacco: Never  Vaping Use   Vaping Use: Never used  Substance and Sexual Activity   Alcohol use: Not Currently      Alcohol/week: 6.0 standard drinks of alcohol      Types: 6 Cans of beer per week      Comment: 3 beers in a sitting   Drug use: No   Sexual activity: Not Currently  Other Topics Concern   Not on file  Social History Narrative    Retired, widowed in 2017     1 son / 1 daughter    2 caffeinated beverages daily no alcohol or tobacco    Fun: Work out in the yard.    Denies  religious beliefs effecting healthcare.     Worked at [employer] for 30 years    Social Determinants of [employer]: Low Risk  (  12/23/2021)    Overall Financial Resource Strain (CARDIA)     Difficulty of Paying Living Expenses: Not hard at all  Food Insecurity: No Food Insecurity (12/23/2021)    Hunger Vital Sign     Worried About Running Out of Food in the Last Year: Never true     Ran Out of Food in the Last Year: Never true  Transportation Needs: No Transportation Needs (12/23/2021)    PRAPARE - Armed forces logistics/support/administrative officer (Medical): No     Lack of Transportation (Non-Medical): No  Physical Activity: Inactive (12/23/2021)    Exercise Vital Sign     Days of Exercise per Week: 0 days     Minutes of Exercise per Session: 0 min  Stress: Stress Concern Present (12/23/2021)    [place]     Feeling of Stress : To some extent  Social Connections: Socially Isolated (12/23/2021)    Social Connection and Isolation Panel [NHANES]     Frequency of Communication with Friends and Family: More than three times a week     Frequency of Social Gatherings with Friends and Family: Once a week     Attends Religious Services: Never     Marine scientist or Organizations: No     Attends Archivist Meetings: Never     Marital Status: Widowed      Family History: The patient's family history includes Alzheimer's disease in his mother; Arthritis in his father and mother; Depression in his brother; Diabetes in his sister and sister; Heart attack in his father, mother, and sister; Heart disease in his father and mother; Hyperlipidemia in his father; Hypertension in his father and mother; Lung cancer in his maternal aunt, paternal aunt, and paternal grandmother; Multiple sclerosis in his sister; Pulmonary embolism in his sister; Stroke in his father.   ROS:   Please see the history of present  illness.    (+) Palpitations (+) Stabbing chest pain (+) Shortness of breath (+) Fatigue (+) Ankle pain All other systems reviewed and are negative.   EKGs/Labs/Other Studies Reviewed:     The following studies were reviewed today:   05/2021  Monitor: Patient had a minimum heart rate of 47 bpm, maximum heart rate of 180 bpm, and average heart rate of 79 bpm. Predominant underlying rhythm was atrial fibrillation (100%). Triggered and diary events associated with atrial fibrillation (rates 60s-110s).   Symptomatic persistent atrial fibrillation.   03/23/2021  Echo:  1. Left ventricular ejection fraction, by estimation, is 55 to 60%. The  left ventricle has normal function. The left ventricle has no regional  wall motion abnormalities. There is mild left ventricular hypertrophy.  Left ventricular diastolic parameters  are consistent with Grade II diastolic dysfunction (pseudonormalization).   2. Right ventricular systolic function is normal. The right ventricular  size is mildly enlarged. There is mildly elevated pulmonary artery  systolic pressure. The estimated right ventricular systolic pressure is  35.5 mmHg.   3. Left atrial size was mildly dilated.   4. The mitral valve is normal in structure. Trivial mitral valve  regurgitation. No evidence of mitral stenosis.   5. The aortic valve is tricuspid. Aortic valve regurgitation is mild to  moderate. Mild aortic valve sclerosis is present, with no evidence of  aortic valve stenosis.   6. Aortic dilatation noted. There is mild dilatation of the aortic root,  measuring 39 mm.   7. The inferior vena  cava is dilated in size with <50% respiratory  variability, suggesting right atrial pressure of 15 mmHg.      EKG:   EKG is personally reviewed.  02/03/2022: EKG was not ordered.     Recent Labs: 10/12/2021: ALT 19; BUN 20; Creatinine, Ser 0.88; Hemoglobin 13.5; Platelets 215.0; Potassium 4.5; Sodium 132; TSH 0.63    Recent Lipid  Panel Labs (Brief)          Component Value Date/Time    CHOL 137 10/12/2021 1527    TRIG 44.0 10/12/2021 1527    HDL 77.80 10/12/2021 1527    CHOLHDL 2 10/12/2021 1527    VLDL 8.8 10/12/2021 1527    LDLCALC 50 10/12/2021 1527        Physical Exam:     VS:  BP 141/81   Pulse 75   Ht _0  (1.803 m)   Wt 172 lb (78 kg)   SpO2 97%   BMI 23.99 kg/m         Wt Readings from Last 3 Encounters:  02/03/22 172 lb (78 kg)  02/02/22 170 lb 6.4 oz (77.3 kg)  10/12/21 170 lb (77.1 kg)      GEN: Well nourished, well developed in no acute distress HEENT: Normal NECK: No JVD; No carotid bruits LYMPHATICS: No lymphadenopathy CARDIAC: RRR, no murmurs, rubs, gallops RESPIRATORY:  Clear to auscultation without rales, wheezing or rhonchi  ABDOMEN: Soft, non-tender, non-distended MUSCULOSKELETAL:  No edema; No deformity  SKIN: Warm and dry NEUROLOGIC:  Alert and oriented x 3 PSYCHIATRIC:  Normal affect          Assessment ASSESSMENT:     1. Persistent atrial fibrillation ([place])   2. Coronary artery disease involving native coronary artery of native heart with angina pectoris ([place])   3. Primary hypertension     PLAN:     In order of problems listed above:     #Persistent atrial fibrillation Mild symptoms of fatigue and dyspnea.  I discussed treatment options available for his atrial fibrillation including a conservative management strategy, rhythm control using antiarrhythmic drugs and rhythm control using a catheter ablation.  He prefers a strategy that avoids long-term exposure antiarrhythmic drug therapy if at all possible.  He takes Eliquis for stroke prophylaxis.  After a long discussion, the patient elected to proceed with catheter ablation of his atrial fibrillation.   Discussed treatment options today for his AF including antiarrhythmic drug therapy and ablation. Discussed risks, recovery and likelihood of success. Discussed potential need for repeat ablation procedures  and antiarrhythmic drugs after an initial ablation. They wish to proceed with scheduling.   Risk, benefits, and alternatives to EP study and radiofrequency ablation for afib were also discussed in detail today. These risks include but are not limited to stroke, bleeding, vascular damage, tamponade, perforation, damage to the esophagus, lungs, and other structures, pulmonary vein stenosis, worsening renal function, and death. The patient understands these risk and wishes to proceed.  We will therefore proceed with catheter ablation at the next available time.  Carto, ICE, anesthesia are requested for the procedure.  Will also obtain CT PV protocol prior to the procedure to exclude LAA thrombus and further evaluate atrial anatomy.   Plan for PVI today. Procedure reviewed.   Signed, Hilton Cork. Quentin Ore, MD, [facility], [facility] 05/17/2022 Electrophysiology [facility]

## 2022-05-17 NOTE — Transfer of Care (Signed)
Immediate Anesthesia Transfer of Care Note  Patient: Justin [name]  Procedure(s) Performed: ATRIAL FIBRILLATION ABLATION  Patient Location: Cath Lab  Anesthesia Type:General  Level of Consciousness: awake, alert  and oriented  Airway & Oxygen Therapy: Patient Spontanous Breathing and Patient connected to nasal cannula oxygen  Post-op Assessment: Report given to RN, Post -op Vital signs reviewed and stable and Patient moving all extremities X 4  Post vital signs: Reviewed and stable  Last Vitals:  Vitals Value Taken Time  BP 137/80 05/17/22 1156  Temp 36.7 C 05/17/22 1158  Pulse 78 05/17/22 1159  Resp 17 05/17/22 1159  SpO2 100 % 05/17/22 1159  Vitals shown include unvalidated device data.  Last Pain:  Vitals:   05/17/22 1158  TempSrc: Temporal  PainSc: Asleep         Complications: No notable events documented.

## 2022-05-17 NOTE — Anesthesia Postprocedure Evaluation (Signed)
Anesthesia Post Note  Patient: Justin [name]  Procedure(s) Performed: ATRIAL FIBRILLATION ABLATION     Patient location during evaluation: Cath Lab Anesthesia Type: General Level of consciousness: sedated, patient cooperative and oriented Pain management: pain level controlled Vital Signs Assessment: post-procedure vital signs reviewed and stable Respiratory status: spontaneous breathing, nonlabored ventilation, respiratory function stable and patient connected to nasal cannula oxygen Cardiovascular status: blood pressure returned to baseline and stable Postop Assessment: no apparent nausea or vomiting Anesthetic complications: no   No notable events documented.  Last Vitals:  Vitals:   05/17/22 1230 05/17/22 1235  BP:  107/62  Pulse:  70  Resp:  (!) 7  Temp: 36.7 C   SpO2:  98%    Last Pain:  Vitals:   05/17/22 1230  TempSrc: Temporal  PainSc:                  Lalitha Ilyas,E. Kiyani Jernigan

## 2022-05-17 NOTE — Discharge Instructions (Signed)
Post procedure care instructions No driving for 4 days. No lifting over 5 lbs for 1 week. No vigorous or sexual activity for 1 week. You may return to work/your usual activities on 04/25/22. Keep procedure site clean & dry. If you notice increased pain, swelling, bleeding or pus, call/return!  You may shower after 24 hours, but no soaking in baths/hot tubs/pools for 1 week.    You have an appointment set up with the [facility].  Multiple studies have shown that being followed by a dedicated atrial fibrillation clinic in addition to the standard care you receive from your other physicians improves health. We believe that enrollment in the atrial fibrillation clinic will allow [place] to better care for you.   The phone number to the [facility] is [phone]. The clinic is staffed Monday through Friday from 8:30am to 5pm.  Parking Directions: The clinic is located in the Heart and Vascular Building connected to [facility]. 1)From [address] turn on to [address] and go to the 3rd entrance  (Heart and Vascular entrance) on the right. 2)Look to the right for Heart &Vascular Parking Garage. 3)A code for the entrance is required, for October is 1504   4)Take the elevators to the 1st floor. Registration is in the room with the glass walls at the end of the hallway.  If you have any trouble parking or locating the clinic, please don't hesitate to call [phone].

## 2022-05-18 ENCOUNTER — Telehealth (HOSPITAL_COMMUNITY): Payer: Self-pay

## 2022-05-18 ENCOUNTER — Emergency Department (HOSPITAL_COMMUNITY): Payer: Medicare Other

## 2022-05-18 ENCOUNTER — Other Ambulatory Visit (HOSPITAL_COMMUNITY): Payer: Self-pay

## 2022-05-18 ENCOUNTER — Emergency Department (HOSPITAL_COMMUNITY)
Admission: EM | Admit: 2022-05-18 | Discharge: 2022-05-19 | Disposition: A | Discharge status: Home / Self Care | Payer: Medicare Other | Source: Home / Self Care | Attending: Emergency Medicine | Admitting: Emergency Medicine

## 2022-05-18 ENCOUNTER — Telehealth: Payer: Self-pay | Admitting: Cardiology

## 2022-05-18 ENCOUNTER — Other Ambulatory Visit: Payer: Self-pay

## 2022-05-18 DIAGNOSIS — Z7901 Long term (current) use of anticoagulants: Secondary | ICD-10-CM | POA: Insufficient documentation

## 2022-05-18 DIAGNOSIS — R1900 Intra-abdominal and pelvic swelling, mass and lump, unspecified site: Secondary | ICD-10-CM | POA: Insufficient documentation

## 2022-05-18 DIAGNOSIS — I25119 Atherosclerotic heart disease of native coronary artery with unspecified angina pectoris: Secondary | ICD-10-CM | POA: Diagnosis not present

## 2022-05-18 DIAGNOSIS — R002 Palpitations: Secondary | ICD-10-CM | POA: Insufficient documentation

## 2022-05-18 DIAGNOSIS — R1030 Lower abdominal pain, unspecified: Secondary | ICD-10-CM | POA: Insufficient documentation

## 2022-05-18 DIAGNOSIS — I10 Essential (primary) hypertension: Secondary | ICD-10-CM | POA: Diagnosis not present

## 2022-05-18 DIAGNOSIS — I4891 Unspecified atrial fibrillation: Secondary | ICD-10-CM | POA: Insufficient documentation

## 2022-05-18 DIAGNOSIS — R0600 Dyspnea, unspecified: Secondary | ICD-10-CM | POA: Diagnosis not present

## 2022-05-18 DIAGNOSIS — I4819 Other persistent atrial fibrillation: Secondary | ICD-10-CM | POA: Diagnosis not present

## 2022-05-18 DIAGNOSIS — R1909 Other intra-abdominal and pelvic swelling, mass and lump: Secondary | ICD-10-CM

## 2022-05-18 LAB — CBC
HCT: 38.9 % — ABNORMAL LOW (ref 39.0–52.0)
Hemoglobin: 12.7 g/dL — ABNORMAL LOW (ref 13.0–17.0)
MCH: 33.8 pg (ref 26.0–34.0)
MCHC: 32.6 g/dL (ref 30.0–36.0)
MCV: 103.5 fL — ABNORMAL HIGH (ref 80.0–100.0)
Platelets: 233 10*3/uL (ref 150–400)
RBC: 3.76 MIL/uL — ABNORMAL LOW (ref 4.22–5.81)
RDW: 12.2 % (ref 11.5–15.5)
WBC: 8.2 10*3/uL (ref 4.0–10.5)
nRBC: 0 % (ref 0.0–0.2)

## 2022-05-18 LAB — BASIC METABOLIC PANEL
Anion gap: 8 (ref 5–15)
BUN: 12 mg/dL (ref 8–23)
CO2: 24 mmol/L (ref 22–32)
Calcium: 9.4 mg/dL (ref 8.9–10.3)
Chloride: 105 mmol/L (ref 98–111)
Creatinine, Ser: 0.89 mg/dL (ref 0.61–1.24)
GFR, Estimated: >60 mL/min (ref >60)
Glucose, Bld: 102 mg/dL — ABNORMAL HIGH (ref 70–99)
Potassium: 3.5 mmol/L (ref 3.5–5.1)
Sodium: 137 mmol/L (ref 135–145)

## 2022-05-18 LAB — MAGNESIUM: Magnesium: 2.3 mg/dL (ref 1.7–2.4)

## 2022-05-18 MED ORDER — IOHEXOL 350 MG/ML SOLN
100.0000 mL | Freq: Once | INTRAVENOUS | Status: AC | PRN
  Administered 2022-05-18: 100 mL via INTRAVENOUS

## 2022-05-18 NOTE — Telephone Encounter (Signed)
Received a message to test claim Xarelto '20mg'$ . It came back as a refill too soon and it was filled at [place] and the copay was $75.12

## 2022-05-18 NOTE — Telephone Encounter (Signed)
Spoke with pt who complains of pain and swelling in his left groin at his incision site that started about 2pm today.  Pt states he removed bandage and now he can palm the swelling.  Site is soft with minimal bruising.  Denies bleeding or oozing. He states pain is a mild ache.  He also complains of his heart "quivering and flip flopping"   Pt advised will discuss with Dr Quentin Ore for further recommendation.  Pt verbalizes understanding and agrees with current plan.

## 2022-05-18 NOTE — ED Provider Triage Note (Signed)
Emergency Medicine Provider Triage Evaluation Note  Justin [name] , a 76 y.o. male  was evaluated in triage.  Pt complains of who presents emergency department for evaluation of left groin swelling and "quivering heart.".  Patient is status post ablation for atrial fibrillation yesterday.  He was evaluated this morning by Dr. Quentin [name] in the office and had no significant issues however this evening he noticed swelling in his left groin.  He also felt like his heart was quivering and consulted with Dr. Quentin [name] who told him to come back here because he might be in A-fib again.  He is currently on Xarelto.  He denies any significant fever or chills, significant groin pain  Review of Systems  Positive: Groin swelling Negative: Fever  Physical Exam  BP (!) 141/95 (BP Location: Left Arm)   Pulse 73   Temp 98.3 F (36.8 C) (Oral)   Resp 17   Wt 78 kg   SpO2 100%   BMI 23.98 kg/m  Gen:   Awake, no distress   Resp:  Normal effort  MSK:   Moves extremities without difficulty  Other:  7 cm area of swelling of the left groin, incision site is intact, minimally tender  Medical Decision Making  Medically screening exam initiated at 8:15 PM.  Appropriate orders placed.  Justin [name] was informed that the remainder of the evaluation will be completed by another provider, this initial triage assessment does not replace that evaluation, and the importance of remaining in the ED until their evaluation is complete.  Work-up initiated   Margarita Mail, PA-C 05/18/22 2020

## 2022-05-18 NOTE — Telephone Encounter (Signed)
Spoke with pt and advised per Dr Quentin Ore pt should go to [facility] ED now for further evaluation.  Pt verbalizes understanding and is agreeable with current plan.

## 2022-05-18 NOTE — Telephone Encounter (Signed)
New Message:     Patient had an Ablation on yesterday. He said early this morning and earlier today where incision was made in his groin, it was fine. He says now the left side of the incision is hurting swollen a lot. He says it feels like fluid is in it.n

## 2022-05-18 NOTE — Telephone Encounter (Signed)
Patient calling to check on status of call earlier today. He states the incision the is swollen and soft.

## 2022-05-18 NOTE — Discharge Summary (Signed)
ELECTROPHYSIOLOGY PROCEDURE DISCHARGE SUMMARY    Patient ID: Justin [name],  MRN: [id number], DOB/AGE: 1946-03-28 76 y.o.  Admit date: 05/17/2022 Discharge date: 05/18/2022  Primary Care Physician: Biagio Borg, MD Primary Cardiologist: Dr. Gasper Sells  Electrophysiologist: Dr. Quentin Ore  Primary Discharge Diagnosis:  persistent Atrial fibrillation      CHA2DS2Vasc is 4, on Xarelto, appropriately dosed  Secondary Discharge Diagnosis:  CAD (PCI 2011) HTN Dementia PTSD  Procedures This Admission:  1.  Electrophysiology study and radiofrequency catheter ablation on by Dr Quentin Ore   05/17/22 CONCLUSIONS: 1. Successful PVI 2. Successful ablation/isolation of the posterior wall 3. Intracardiac echo reveals small pericardial effusion, dilated LA 4. No early apparent complications. 5. Colchicine 0.'6mg'$  PO BID x 5 days 6. Protonix '40mg'$  PO daily x 45 days  Brief HPI: Justin [name] is a 76 y.o. male with a history of persistent atrial fibrillation.  Risks, benefits, and alternatives to catheter ablation of atrial fibrillation were reviewed with the patient who wished to proceed.    Hospital Course:  The patient was admitted and underwent EPS/RFCA of atrial fibrillation with details as outlined in the procedure report.  They were monitored on telemetry overnight which demonstrated SR .  Groin sites are without complication on the day of discharge.  The patient feels well today, denies CP, SOB, site discomfort.  He was examined by Dr. Quentin Ore and considered to be stable for discharge.  There was mention of some transient positional dizziness, visual changes, post fall, he had an out patient CT head after his fall that was OK.  Not suspect to have any acute neuro issues.  Wound care and restrictions were reviewed with the patient.  The patient will be seen back by the Afib clinic in 4 weeks and Dr Quentin Ore in 12 weeks for post ablation follow up.    Physical Exam: Vitals:   05/17/22  2017 05/17/22 2353 05/18/22 0601 05/18/22 0811  BP: 105/65 121/77 131/76 138/84  Pulse: 75 80 80 81  Resp:    16  Temp: 98.3 F (36.8 C) 97.8 F (36.6 C) 97.9 F (36.6 C) 97.8 F (36.6 C)  TempSrc: Oral Oral Oral Oral  SpO2: 99% 98% 99%   Weight:      Height:        GEN- The patient is well appearing, alert and oriented x 3 today.   HEENT: normocephalic, atraumatic; sclera clear, conjunctiva pink; hearing intact; oropharynx clear; neck supple  Lungs- CTA b/l, normal work of breathing.  No wheezes, rales, rhonchi Heart- RRR, no murmurs, rubs or gallops  GI- soft, non-tender, non-distended, bowel sounds present  Extremities- no clubbing, cyanosis, or edema; b/l groin sites are stable without hematoma/bruit MS- no significant deformity or atrophy Skin- warm and dry, no rash or lesion Psych- euthymic mood, full affect Neuro- strength and sensation are intact   Labs:   Lab Results  Component Value Date   WBC 10.1 04/26/2022   HGB 14.2 04/26/2022   HCT 40.7 04/26/2022   MCV 96 04/26/2022   PLT 181 04/26/2022   No results for input(s): "NA", "K", "CL", "CO2", "BUN", "CREATININE", "CALCIUM", "PROT", "BILITOT", "ALKPHOS", "ALT", "AST", "GLUCOSE" in the last 168 hours.  Invalid input(s): "LABALBU"   Discharge Medications:  Allergies as of 05/18/2022       Reactions   Ambien [zolpidem Tartrate] Other (See Comments)   Blackout, memory issues   Codeine    Other reaction(s): Other (See Comments) Memory issues Other reaction(s): Other Memory  issues   Ativan [lorazepam] Other (See Comments)   Made him feel completely out of it fell down   Seroquel [quetiapine]    SEVERE NIGHTMARES, SLEEPWALK AND NIGHT DRIVE WITH NO RECOLLECTION UPON WAKENING        Medication List     TAKE these medications    ALPRAZolam 1 MG tablet Commonly known as: XANAX TAKE 1/2 TO 1 TABLET BY MOUTH ONCE DAILY What changed: See the new instructions.   colchicine 0.6 MG tablet Take 1 tablet  (0.6 mg total) by mouth 2 (two) times daily for 5 days.   diltiazem 180 MG 24 hr capsule Commonly known as: CARDIZEM CD TAKE 1 CAPSULE BY MOUTH DAILY   isosorbide mononitrate 30 MG 24 hr tablet Commonly known as: IMDUR TAKE 1 TABLET BY MOUTH DAILY   lamoTRIgine 200 MG tablet Commonly known as: LAMICTAL Take 1 tablet (200 mg total) by mouth daily. For mood control   lamoTRIgine 25 MG tablet Commonly known as: LaMICtal Take one tablet (25 mg) every night at bedtime.   nitroGLYCERIN 0.4 MG SL tablet Commonly known as: NITROSTAT Place 1 tablet (0.4 mg total) under the tongue every 5 (five) minutes as needed for chest pain.   pantoprazole 40 MG tablet Commonly known as: Protonix Take 1 tablet (40 mg total) by mouth daily.   predniSONE 10 MG tablet Commonly known as: DELTASONE 3 tabs by mouth per day for 3 days,2tabs per day for 3 days,1tab per day for 3 days   rivaroxaban 20 MG Tabs tablet Commonly known as: XARELTO Take 1 tablet (20 mg total) by mouth daily.   rosuvastatin 20 MG tablet Commonly known as: CRESTOR TAKE 1 TABLET BY MOUTH DAILY        Disposition: Home Discharge Instructions     Diet - low sodium heart healthy   Complete by: As directed    Increase activity slowly   Complete by: As directed         Duration of Discharge Encounter: Greater than 30 minutes including physician time.  Venetia Night, PA-C 05/18/2022 10:32 AM

## 2022-05-18 NOTE — ED Triage Notes (Signed)
Pt presents from home for f/u ablation. Discharged this AM.  When he left, both groin incisions were flat, noticed swelling on L groin now. Mildly painful. Started xarelto last Monday.  Also experiencing palpitations starting 2pm. Now SOB, feels like previous afib

## 2022-05-18 NOTE — Plan of Care (Signed)
  Problem: Education: Goal: Knowledge of General Education information will improve Description: Including pain rating scale, medication(s)/side effects and non-pharmacologic comfort measures Outcome: Adequate for Discharge   Problem: Health Behavior/Discharge Planning: Goal: Ability to manage health-related needs will improve Outcome: Adequate for Discharge   Problem: Clinical Measurements: Goal: Ability to maintain clinical measurements within normal limits will improve Outcome: Adequate for Discharge Goal: Will remain free from infection Outcome: Adequate for Discharge Goal: Diagnostic test results will improve Outcome: Adequate for Discharge Goal: Respiratory complications will improve Outcome: Adequate for Discharge Goal: Cardiovascular complication will be avoided Outcome: Adequate for Discharge   Problem: Activity: Goal: Risk for activity intolerance will decrease Outcome: Adequate for Discharge   Problem: Nutrition: Goal: Adequate nutrition will be maintained Outcome: Adequate for Discharge   Problem: Coping: Goal: Level of anxiety will decrease Outcome: Adequate for Discharge   Problem: Elimination: Goal: Will not experience complications related to bowel motility Outcome: Adequate for Discharge Goal: Will not experience complications related to urinary retention Outcome: Adequate for Discharge   Problem: Pain Managment: Goal: General experience of comfort will improve Outcome: Adequate for Discharge   Problem: Safety: Goal: Ability to remain free from injury will improve Outcome: Adequate for Discharge   Problem: Skin Integrity: Goal: Risk for impaired skin integrity will decrease Outcome: Adequate for Discharge   Problem: Education: Goal: Understanding of disease, treatment, and recovery process will improve Outcome: Adequate for Discharge   Problem: Activity: Goal: Ability to return to baseline activity level will improve Outcome: Adequate for  Discharge   Problem: Cardiac: Goal: Ability to maintain adequate cardiovascular perfusion will improve Outcome: Adequate for Discharge Goal: Vascular access site(s) Level 0-1 will be maintained Outcome: Adequate for Discharge   Problem: Health Behavior/ Discharge Planning: Goal: Ability to safely manage health related needs after discharge Outcome: Adequate for Discharge   

## 2022-05-18 NOTE — Progress Notes (Signed)
Per pt... since hitting his head on 05/10/22, during syncopal episode, he has had the following two events  Friday 05/13/22 - after turning his head to left side pt's vision became blurry/distorted for ~ 5 to 7 mins. Tuesday ~ 23:45 -after turning head to left side pt's vision again became blurry/distorted but this time for just a few seconds.   Per pt...hx of cervical disc removal in 1998. Pt stated that when he came to after hitting his head on 9/19 that he had severe, sharp, shooting pain in his head that ran down his neck; pt states he also had a severe headache, nausea & sleepiness following his head injury

## 2022-05-19 NOTE — Discharge Instructions (Signed)
If you have any worsening swelling, bruising or weakness or numbness in your left leg please return to the ER

## 2022-05-19 NOTE — ED Provider Notes (Signed)
[facility] EMERGENCY DEPARTMENT Provider Note   CSN: [id number] Arrival date & time: 05/18/22  1756     History  Chief Complaint- groin swelling    Justin [name] is a 76 y.o. male.  HPI   Patient underwent catheter ablation for atrial fibrillation on September 26.  Starting yesterday he started having swelling and pain in his left groin.  No fevers or vomiting.  He reports having some palpitations that are improving  Home Medications Prior to Admission medications   Medication Sig Start Date End Date Taking? Authorizing Provider  ALPRAZolam (XANAX) 1 MG tablet TAKE 1/2 TO 1 TABLET BY MOUTH ONCE DAILY Patient taking differently: Take 0.5-1 mg by mouth daily as needed for anxiety. 04/12/22   Biagio Borg, MD  colchicine 0.6 MG tablet Take 1 tablet (0.6 mg total) by mouth 2 (two) times daily for 5 days. 05/17/22 05/22/22  Baldwin Jamaica, PA-C  diltiazem (CARDIZEM CD) 180 MG 24 hr capsule TAKE 1 CAPSULE BY MOUTH DAILY 06/17/21   Richardson Dopp T, PA-C  isosorbide mononitrate (IMDUR) 30 MG 24 hr tablet TAKE 1 TABLET BY MOUTH DAILY 05/12/22   Chandrasekhar, Mahesh A, MD  lamoTRIgine (LAMICTAL) 200 MG tablet Take 1 tablet (200 mg total) by mouth daily. For mood control 03/11/22   Arfeen, Arlyce Harman, MD  lamoTRIgine (LAMICTAL) 25 MG tablet Take one tablet (25 mg) every night at bedtime. 03/11/22   Arfeen, Arlyce Harman, MD  nitroGLYCERIN (NITROSTAT) 0.4 MG SL tablet Place 1 tablet (0.4 mg total) under the tongue every 5 (five) minutes as needed for chest pain. 03/14/22   Richardson Dopp T, PA-C  pantoprazole (PROTONIX) 40 MG tablet Take 1 tablet (40 mg total) by mouth daily. 05/17/22 07/01/22  Baldwin Jamaica, PA-C  predniSONE (DELTASONE) 10 MG tablet 3 tabs by mouth per day for 3 days,2tabs per day for 3 days,1tab per day for 3 days 02/02/22   Biagio Borg, MD  rivaroxaban (XARELTO) 20 MG TABS tablet Take 1 tablet (20 mg total) by mouth daily. 05/09/22   Vickie Epley, MD   rosuvastatin (CRESTOR) 20 MG tablet TAKE 1 TABLET BY MOUTH DAILY 05/12/22   Werner Lean, MD      Allergies    Ambien [zolpidem tartrate], Codeine, Ativan [lorazepam], and Seroquel [quetiapine]    Review of Systems   Review of Systems  Cardiovascular:  Positive for palpitations.    Physical Exam Updated Vital Signs BP (!) 162/94 (BP Location: Left Arm)   Pulse 76   Temp 98.3 F (36.8 C) (Oral)   Resp 19   Wt 78 kg   SpO2 99%   BMI 23.98 kg/m  Physical Exam CONSTITUTIONAL: Elderly, no acute distress HEAD: Normocephalic/atraumatic EYES: EOMI ENMT: Mucous membranes moist NECK: supple no meningeal signs CV: S1/S2 noted, no murmurs/rubs/gallops noted LUNGS: Lungs are clear to auscultation bilaterally, no apparent distress ABDOMEN: soft, nontender, no rebound or guarding, bowel sounds noted throughout abdomen GU: Area of swelling noted to the left groin, no bruising, no bleeding.  No crepitus.  No thrill NEURO: Pt is awake/alert/appropriate, moves all extremitiesx4.  No facial droop.   EXTREMITIES: pulses normal/equal, full ROM Distal pulses intact SKIN: warm, color normal PSYCH: no abnormalities of mood noted, alert and oriented to situation  ED Results / Procedures / Treatments   Labs (all labs ordered are listed, but only abnormal results are displayed) Labs Reviewed  BASIC METABOLIC PANEL - Abnormal; Notable for the following components:  Result Value   Glucose, Bld 102 (*)    All other components within normal limits  CBC - Abnormal; Notable for the following components:   RBC 3.76 (*)    Hemoglobin 12.7 (*)    HCT 38.9 (*)    MCV 103.5 (*)    All other components within normal limits  MAGNESIUM    EKG EKG Interpretation  Date/Time:  Wednesday May 18 2022 20:18:24 EDT Ventricular Rate:  81 PR Interval:  190 QRS Duration: 102 QT Interval:  366 QTC Calculation: 425 R Axis:   -53 Text Interpretation: Normal sinus rhythm Incomplete  right bundle branch block Left anterior fascicular block Nonspecific ST abnormality similar to earlier in the day Confirmed by Sherwood Gambler [phone]) on 05/18/2022 8:23:40 PM  Radiology CT ANGIO AO+BIFEM W & OR WO CONTRAST  Result Date: 05/18/2022 CLINICAL DATA:  Left groin swelling and getting larger since this afternoon; catheterization done for cardiac ablation yesterday EXAM: CT ANGIOGRAPHY OF ABDOMINAL AORTA WITH ILIOFEMORAL RUNOFF TECHNIQUE: Multidetector CT imaging of the abdomen, pelvis and lower extremities was performed using the standard protocol during bolus administration of intravenous contrast. Multiplanar CT image reconstructions and MIPs were obtained to evaluate the vascular anatomy. RADIATION DOSE REDUCTION: This exam was performed according to the departmental dose-optimization program which includes automated exposure control, adjustment of the mA and/or kV according to patient size and/or use of iterative reconstruction technique. CONTRAST:  194m OMNIPAQUE IOHEXOL 350 MG/ML SOLN COMPARISON:  CT abdomen and pelvis 06/09/2017 FINDINGS: VASCULAR Aorta: Scattered calcified atherosclerotic plaque. No significant stenosis, aneurysm, or dissection. Celiac: Patent without evidence of aneurysm, dissection, vasculitis or significant stenosis. SMA: Mixed density plaque at the origin extending into the proximal SMA causes mild-moderate narrowing. The SMA is otherwise patent without aneurysm or dissection. Renals: Calcified plaque causes moderate stenosis at the origin of the right renal artery. The left renal artery is patent. No evidence of aneurysm, dissection, or fibromuscular dysplasia. IMA: Patent. RIGHT Lower Extremity Inflow: Common, internal and external iliac arteries are patent without evidence of aneurysm, dissection, vasculitis or significant stenosis. Outflow: Common, superficial and profunda femoral arteries and the popliteal artery are patent without evidence of aneurysm, dissection,  vasculitis or significant stenosis. Runoff: Patent three vessel runoff to the ankle. Calcified plaque causes moderate narrowing of the proximal anterior tibial artery LEFT Lower Extremity Inflow: Common, internal and external iliac arteries are patent without evidence of aneurysm, dissection, vasculitis or significant stenosis. Outflow: Common, superficial and profunda femoral arteries and the popliteal artery are patent without evidence of aneurysm, dissection, vasculitis or significant stenosis. There is no pseudoaneurysm, evidence of active extravasation or AV fistula. A portion of the popliteal artery is obscured by streak artifact from the left TKA. Runoff: Patent three vessel runoff to the ankle. Veins: No obvious venous abnormality within the limitations of this arterial phase study. Review of the MIP images confirms the above findings. NON-VASCULAR Lower chest: Coronary artery atherosclerotic calcification. Coronary stenting. Small pericardial effusion. Hepatobiliary: No focal liver abnormality is seen. No gallstones, gallbladder wall thickening, or biliary dilatation. Pancreas: Unremarkable. No pancreatic ductal dilatation or surrounding inflammatory changes. Spleen: Normal in size without focal abnormality. Adrenals/Urinary Tract: Large right renal cyst not requiring follow-up. No hydronephrosis or urinary calculi. Unremarkable adrenal glands and bladder. Stomach/Bowel: Colonic diverticulosis without diverticulitis. Normal caliber large and small bowel. Unremarkable stomach. Lymphatic: No suspicious adenopathy. Reproductive: Mild prostate enlargement. Other: No free intraperitoneal fluid or air. Musculoskeletal: Subcutaneous edema about the anteromedial left thigh/groin likely related to cardiac  catheterization yesterday. No evidence of active extravasation on single phase exam. IMPRESSION: VASCULAR Patent three-vessel runoff of the lower extremities. No evidence of aneurysm, dissection, pseudoaneurysm,  active bleeding or AV fistula in the left groin. Subcutaneous edema in the left groin is likely related to cardiac catheterization yesterday. NON-VASCULAR No acute abnormality in the abdomen or pelvis. Coronary artery atherosclerosis and stenting. Small pericardial effusion. Colonic diverticulosis without diverticulitis. Electronically Signed   By: Placido Sou M.D.   On: 05/18/2022 23:17   EP STUDY  Result Date: 05/17/2022 CONCLUSIONS: 1. Successful PVI 2. Successful ablation/isolation of the posterior wall 3. Intracardiac echo reveals small pericardial effusion, dilated LA 4. No early apparent complications. 5. Colchicine 0.'6mg'$  PO BID x 5 days 6. Protonix '40mg'$  PO daily x 45 days    Procedures Procedures    Medications Ordered in ED Medications  iohexol (OMNIPAQUE) 350 MG/ML injection 100 mL (100 mLs Intravenous Contrast Given 05/18/22 2240)    ED Course/ Medical Decision Making/ A&P                           Medical Decision Making  Patient presents with groin swelling after having a catheter ablation performed on 26 September. CT imaging is negative for any acute vascular complications. Patient is in no acute distress.  He was concerned he was back in A-fib, but his  EKG reveals sinus.  Discussed with cardiology fellow Dr Marcelle Smiling Given the appearance on exam as well as CT imaging, patient is safe for discharge and follow-up       Final Clinical Impression(s) / ED Diagnoses Final diagnoses:  Groin swelling    Rx / DC Orders ED Discharge Orders     None         Ripley Fraise, MD 05/19/22 0230

## 2022-05-24 ENCOUNTER — Ambulatory Visit (INDEPENDENT_AMBULATORY_CARE_PROVIDER_SITE_OTHER): Payer: Medicare Other | Admitting: Licensed Clinical Social Worker

## 2022-05-24 DIAGNOSIS — F3162 Bipolar disorder, current episode mixed, moderate: Secondary | ICD-10-CM

## 2022-05-25 ENCOUNTER — Encounter (HOSPITAL_COMMUNITY): Payer: Self-pay | Admitting: Licensed Clinical Social Worker

## 2022-05-25 NOTE — Progress Notes (Signed)
   THERAPIST PROGRESS NOTE  Session Time: 1:30pm-2:20pm  Participation Level: Active  Behavioral Response: Well GroomedAlertDepressed  Type of Therapy: Individual Therapy  Treatment Goals addressed: I'm really worried about my sleep issues and how they are effecting my memory and my mind, having more hallucinations, feeling out of it". Justin [name] will improve psychological functioning as evidenced by reduced experience of psychoses, improvement in sleep, and more stabilized mood 3 out of 7 days.  ProgressTowards Goals: Progressing  Interventions: CBT  Summary: Justin [name] is a 76 y.o. male who presents with Bipolar I, mixed, moderate.   Suicidal/Homicidal: Nowithout intent/plan  Therapist Response: Justin [name] engaged well in individual session. Clinician utilized CBT to process thoughts and feelings about relationships with son, daughter in law, and grandchildren. Clinician discussed updates in health and noted that heart procedure went well, but still having some challenges with A-Fib. Clinician processed thoughts and feelings of "emptiness" regarding his relationship with his son. Justin [name] shared the positive impact his neighbors have had on his life, noting that there are daily phone calls, meals, check-ins, and assistance as needed. Justin [name] identifeid that this was what he hoped for and expected from his son, but instead he gets very little interaction. Clinician noted that at times we may create a "family of choice" when the biological family is not available.   Plan: Return again in 2 weeks.  Diagnosis: Bipolar 1 disorder, mixed, moderate ([place] [facility] [place])  Collaboration of Care: Psychiatrist AEB: informed Dr. Adele Schilder of updates  Patient/Guardian was advised Release of Information must be obtained prior to any record release in order to collaborate their care with an outside provider. Patient/Guardian was advised if they have not already done so to contact the registration department to sign all necessary  forms in order for [place] to release information regarding their care.   Consent: Patient/Guardian gives verbal consent for treatment and assignment of benefits for services provided during this visit. Patient/Guardian expressed understanding and agreed to proceed.   [facility], LCSW 05/25/2022

## 2022-05-30 ENCOUNTER — Other Ambulatory Visit: Payer: Self-pay | Admitting: Internal Medicine

## 2022-06-07 ENCOUNTER — Ambulatory Visit (INDEPENDENT_AMBULATORY_CARE_PROVIDER_SITE_OTHER): Payer: Medicare Other | Admitting: Licensed Clinical Social Worker

## 2022-06-07 DIAGNOSIS — F3162 Bipolar disorder, current episode mixed, moderate: Secondary | ICD-10-CM | POA: Diagnosis not present

## 2022-06-10 ENCOUNTER — Encounter (HOSPITAL_COMMUNITY): Payer: Self-pay | Admitting: Psychiatry

## 2022-06-10 ENCOUNTER — Telehealth (HOSPITAL_BASED_OUTPATIENT_CLINIC_OR_DEPARTMENT_OTHER): Payer: Medicare Other | Admitting: Psychiatry

## 2022-06-10 VITALS — Wt 171.0 lb

## 2022-06-10 DIAGNOSIS — F4310 Post-traumatic stress disorder, unspecified: Secondary | ICD-10-CM

## 2022-06-10 DIAGNOSIS — F3162 Bipolar disorder, current episode mixed, moderate: Secondary | ICD-10-CM

## 2022-06-10 DIAGNOSIS — F419 Anxiety disorder, unspecified: Secondary | ICD-10-CM | POA: Diagnosis not present

## 2022-06-10 MED ORDER — LAMOTRIGINE 200 MG PO TABS: 200.0000 mg | ORAL_TABLET | Freq: Every day | ORAL | 2 refills | Status: DC

## 2022-06-10 MED ORDER — LAMOTRIGINE 25 MG PO TABS: ORAL_TABLET | ORAL | 2 refills | Status: DC

## 2022-06-10 MED ORDER — DULOXETINE HCL 20 MG PO CPEP: 20.0000 mg | ORAL_CAPSULE | Freq: Every day | ORAL | 0 refills | Status: DC

## 2022-06-10 NOTE — Progress Notes (Unsigned)
Virtual Visit via Telephone Note  I connected with Justin [name] on 06/10/22 at 10:00 AM EDT by telephone and verified that I am speaking with the correct person using two identifiers.  Location: Patient: Home Provider: Home Office   I discussed the limitations, risks, security and privacy concerns of performing an evaluation and management service by telephone and the availability of in person appointments. I also discussed with the patient that there may be a patient responsible charge related to this service. The patient expressed understanding and agreed to proceed.   History of Present Illness: Patient is evaluated by phone session.  He is more sad and depressed and have a lot of anxiety.  Recently he was admitted because of A-fib.  He had a cardiac procedure but sometime he had negative thoughts.  He is thinking about his family and his aging and also now multiple health issues.  He continues to have dreams, bad thoughts at night.  However he denies any suicidal thoughts.  He is very disappointed because his son does not talk to him anymore and even he was in the hospital he barely talked to him.  He admitted expectations from the family and is thinking lately about the family.  He is in therapy with Janett Billow.  He is taking Lamictal 225 mg which he reported helped his mood swing irritability and mania.  However he still feels sad and depressed.  He has back pain.  Recently had a cardiac ablation.  His appetite is okay.  His energy level is low.  He denies any hallucination, paranoia or any aggressive behavior.  His support system is his sister.  He is getting Xanax from his PCP which he takes as needed.  Struggle with sleep and having dreams.  He has no rash or itching tremors or shakes.   Past Psychiatric History:  H/O suicidal attempt by overdose on Ambien twice, hydroxyzine and alcohol. H/O bipolar disorder with multiple inpatient.  Did IOP. Last ER visit Sept 2020 and inpatient October 2018  at [facility]. Tried Zyprexa, Cymbalta, Seroquel, Vistaril, Neurontin, Wellbutrin and triptal. Tried low-dose doxepin and trazodone. Had a reaction with Ativan and pain medication.  Saw Dr. Letta Moynahan in past.  Recent Results (from the past 2160 hour(s))  CBC with Differential/Platelet     Status: Abnormal   Collection Time: 04/26/22 10:07 AM  Result Value Ref Range   WBC 10.1 3.4 - 10.8 x10E3/uL   RBC 4.22 4.14 - 5.80 x10E6/uL   Hemoglobin 14.2 13.0 - 17.7 g/dL   Hematocrit 40.7 37.5 - 51.0 %   MCV 96 79 - 97 fL   MCH 33.6 (H) 26.6 - 33.0 pg   MCHC 34.9 31.5 - 35.7 g/dL   RDW 13.6 11.6 - 15.4 %   Platelets 181 150 - 450 x10E3/uL   Neutrophils 83 Not Estab. %   Lymphs 8 Not Estab. %   Monocytes 8 Not Estab. %   Eos 1 Not Estab. %   Basos 0 Not Estab. %   Neutrophils Absolute 8.5 (H) 1.4 - 7.0 x10E3/uL   Lymphocytes Absolute 0.8 0.7 - 3.1 x10E3/uL   Monocytes Absolute 0.8 0.1 - 0.9 x10E3/uL   EOS (ABSOLUTE) 0.1 0.0 - 0.4 x10E3/uL   Basophils Absolute 0.0 0.0 - 0.2 O87O6/VE  Basic metabolic panel     Status: Abnormal   Collection Time: 04/26/22 10:07 AM  Result Value Ref Range   Glucose 113 (H) 70 - 99 mg/dL   BUN 18 8 -  27 mg/dL   Creatinine, Ser 0.90 0.76 - 1.27 mg/dL   eGFR 89 >59 mL/min/1.73   BUN/Creatinine Ratio 20 10 - 24   Sodium 133 (L) 134 - 144 mmol/L   Potassium 4.7 3.5 - 5.2 mmol/L   Chloride 98 96 - 106 mmol/L   CO2 27 20 - 29 mmol/L   Calcium 9.6 8.6 - 10.2 mg/dL  POCT Activated clotting time     Status: None   Collection Time: 05/17/22 10:49 AM  Result Value Ref Range   Activated Clotting Time 275 seconds    Comment: Reference range 74-137 seconds for patients not on anticoagulant therapy.  POCT Activated clotting time     Status: None   Collection Time: 05/17/22 11:13 AM  Result Value Ref Range   Activated Clotting Time 311 seconds    Comment: Reference range 74-137 seconds for patients not on anticoagulant therapy.  Basic metabolic panel     Status:  Abnormal   Collection Time: 05/18/22  8:25 PM  Result Value Ref Range   Sodium 137 135 - 145 mmol/L   Potassium 3.5 3.5 - 5.1 mmol/L   Chloride 105 98 - 111 mmol/L   CO2 24 22 - 32 mmol/L   Glucose, Bld 102 (H) 70 - 99 mg/dL    Comment: Glucose reference range applies only to samples taken after fasting for at least 8 hours.   BUN 12 8 - 23 mg/dL   Creatinine, Ser 0.89 0.61 - 1.24 mg/dL   Calcium 9.4 8.9 - 10.3 mg/dL   GFR, Estimated >60 >60 mL/min    Comment: (NOTE) Calculated using the CKD-EPI Creatinine Equation (2021)    Anion gap 8 5 - 15    Comment: Performed at [facility] [address], [place], [place] [address]  Magnesium     Status: None   Collection Time: 05/18/22  8:25 PM  Result Value Ref Range   Magnesium 2.3 1.7 - 2.4 mg/dL    Comment: Performed at [facility] [address] [place], [place] [address]  CBC     Status: Abnormal   Collection Time: 05/18/22  8:25 PM  Result Value Ref Range   WBC 8.2 4.0 - 10.5 K/uL   RBC 3.76 (L) 4.22 - 5.81 MIL/uL   Hemoglobin 12.7 (L) 13.0 - 17.0 g/dL   HCT 38.9 (L) 39.0 - 52.0 %   MCV 103.5 (H) 80.0 - 100.0 fL   MCH 33.8 26.0 - 34.0 pg   MCHC 32.6 30.0 - 36.0 g/dL   RDW 12.2 11.5 - 15.5 %   Platelets 233 150 - 400 K/uL   nRBC 0.0 0.0 - 0.2 %    Comment: Performed at [facility], [place] [address], [place], [place] [address]     Psychiatric Specialty Exam: Physical Exam  Review of Systems  Weight 171 lb (77.6 kg).There is no height or weight on file to calculate BMI.  General Appearance: NA  Eye Contact:  NA  Speech:  Slow  Volume:  Decreased  Mood:  Depressed and Dysphoric  Affect:  NA  Thought Process:  Descriptions of Associations: Intact  Orientation:  Full (Time, Place, and Person)  Thought Content:  Rumination  Suicidal Thoughts:  No  Homicidal Thoughts:  No  Memory:  Immediate;   Fair Recent;   Fair Remote;   Fair  Judgement:  Intact  Insight:  Shallow  Psychomotor Activity:  NA   Concentration:  Concentration: Fair and Attention  Span: Fair  Recall:  Good  Fund of Knowledge:  Good  Language:  Good  Akathisia:  No  Handed:  Right  AIMS (if indicated):     Assets:  Communication Skills Desire for Improvement Housing Transportation  ADL's:  Intact  Cognition:  WNL  Sleep:   fair      Assessment and Plan: PTSD.  Bipolar disorder type I.  Anxiety.  Discussed blood work results.  Chronic stressors and recently more health issues and ruminative about his family.  Discussed to try low-dose Cymbalta which he had taken in the past but do not remember why it was stopped.  He did not recall any side effects.  Like to keep the current dose of Lamictal.  We will add low-dose Cymbalta 20 mg daily.  Discuss safety concerns at any time having active suicidal thoughts or homicidal halogen need to call 911 or go to local emergency room.  We will follow-up in 4 weeks.  Follow Up Instructions:    I discussed the assessment and treatment plan with the patient. The patient was provided an opportunity to ask questions and all were answered. The patient agreed with the plan and demonstrated an understanding of the instructions.   The patient was advised to call back or seek an in-person evaluation if the symptoms worsen or if the condition fails to improve as anticipated.  Collaboration of Care: Other provider involved in patient's care AEB notes are available in epic to review.  Patient/Guardian was advised Release of Information must be obtained prior to any record release in order to collaborate their care with an outside provider. Patient/Guardian was advised if they have not already done so to contact the registration department to sign all necessary forms in order for [place] to release information regarding their care.   Consent: Patient/Guardian gives verbal consent for treatment and assignment of benefits for services provided during this visit. Patient/Guardian expressed  understanding and agreed to proceed.    I provided 25 minutes of non-face-to-face time during this encounter.   Kathlee Nations, MD

## 2022-06-13 ENCOUNTER — Encounter (HOSPITAL_COMMUNITY): Payer: Self-pay | Admitting: Licensed Clinical Social Worker

## 2022-06-13 NOTE — Progress Notes (Signed)
Virtual Visit via Telephone Note  I connected with Justin [name] on 06/13/22 at  9:00 AM EDT by telephone and verified that I am speaking with the correct person using two identifiers.  Location: Patient: home Provider: office   I discussed the limitations, risks, security and privacy concerns of performing an evaluation and management service by telephone and the availability of in person appointments. I also discussed with the patient that there may be a patient responsible charge related to this service. The patient expressed understanding and agreed to proceed.     I discussed the assessment and treatment plan with the patient. The patient was provided an opportunity to ask questions and all were answered. The patient agreed with the plan and demonstrated an understanding of the instructions.   The patient was advised to call back or seek an in-person evaluation if the symptoms worsen or if the condition fails to improve as anticipated.  I provided 45 minutes of non-face-to-face time during this encounter.   Justin Curling, LCSW   THERAPIST PROGRESS NOTE  Session Time: 9:00am-9:45am  Participation Level: Active  Behavioral Response: NAAlertDepressed and Irritable  Type of Therapy: Individual Therapy  Treatment Goals addressed: : I'm really worried about my sleep issues and how they are effecting my memory and my mind, having more hallucinations, feeling out of it". Justin [name] will improve psychological functioning as evidenced by reduced experience of psychoses, improvement in sleep, and more stabilized mood 3 out of 7 days.  ProgressTowards Goals: Progressing  Interventions: Motivational Interviewing  Summary: SOULEYMANE [name] is a 76 y.o. male who presents with Bipolar I mixed, moderate.   Suicidal/Homicidal: Nowithout intent/plan  Therapist Response: Justin [name] engaged well in individual telephone session. Clinician utilized MI to reflect and summarize thoughts and feelings  about relationships with son, daughter, sister. Clinician explored updates in upcoming plans for meals out and time together. Justin [name] shared more realistic thought processes about his expectations of his family. Clinician echoed that acceptance of who and how these people are with him are more an indicator of them than him. Clinician also processed the disappointment in his family and their willingness to be a part of his life. Clinician reminded Justin [name] that he cannot control other people or make them do anything. However, he can control his thoughts, feelings, and behaviors and focus on those that are willing to be a part of his life.   Plan: Return again in 2 weeks.  Diagnosis: Bipolar 1 disorder, mixed, moderate ([place])  Collaboration of Care: Other none required in this session  Patient/Guardian was advised Release of Information must be obtained prior to any record release in order to collaborate their care with an outside provider. Patient/Guardian was advised if they have not already done so to contact the registration department to sign all necessary forms in order for [place] to release information regarding their care.   Consent: Patient/Guardian gives verbal consent for treatment and assignment of benefits for services provided during this visit. Patient/Guardian expressed understanding and agreed to proceed.   [address], LCSW 06/13/2022

## 2022-06-14 ENCOUNTER — Encounter (HOSPITAL_COMMUNITY): Payer: Self-pay | Admitting: Physician Assistant

## 2022-06-14 ENCOUNTER — Ambulatory Visit (HOSPITAL_COMMUNITY)
Admission: RE | Admit: 2022-06-14 | Discharge: 2022-06-14 | Disposition: A | Discharge status: Home / Self Care | Payer: Medicare Other | Source: Ambulatory Visit | Attending: Physician Assistant | Admitting: Physician Assistant

## 2022-06-14 VITALS — BP 148/90 | HR 91 | Ht 71.0 in | Wt 173.2 lb

## 2022-06-14 DIAGNOSIS — Z7901 Long term (current) use of anticoagulants: Secondary | ICD-10-CM | POA: Diagnosis not present

## 2022-06-14 DIAGNOSIS — D6869 Other thrombophilia: Secondary | ICD-10-CM | POA: Diagnosis not present

## 2022-06-14 DIAGNOSIS — I4819 Other persistent atrial fibrillation: Secondary | ICD-10-CM | POA: Insufficient documentation

## 2022-06-14 DIAGNOSIS — I2510 Atherosclerotic heart disease of native coronary artery without angina pectoris: Secondary | ICD-10-CM | POA: Diagnosis not present

## 2022-06-14 DIAGNOSIS — E785 Hyperlipidemia, unspecified: Secondary | ICD-10-CM | POA: Diagnosis not present

## 2022-06-14 DIAGNOSIS — I10 Essential (primary) hypertension: Secondary | ICD-10-CM | POA: Insufficient documentation

## 2022-06-14 NOTE — Progress Notes (Signed)
Primary Care Physician: Biagio Borg, MD Primary Cardiologist: Dr Gasper Sells Primary Electrophysiologist: none Referring Physician: Dr Ollen Gross is a 76 y.o. male with a history of CAD, HTN, HLD, atrial fibrillation who presents for follow up in the [facility].  The patient was initially diagnosed with atrial fibrillation 05/06/21 at Dr Oralia Rud office. He wore a cardiac monitor which showed 100% rate controlled afib. He does feel palpitations and "swimmy headed" while in afib. He also has more SOB and fatigue. Patient is on Eliquis for a CHADS2VASC score of 4. Patient underwent ankle surgery on 06/08/21. He resumed anticoagulation 06/10/21.   Patient is s/p DCCV on 08/05/21 with quick return of afib. He saw Dr Quentin Ore and had an afib ablation 05/17/22.   On follow up today, patient reports that he has had intermittent SOB and quick, stabbing chest pain. He states that some days he feels "great" without limitation but other days he has no energy. He was SOB this AM while walking his dog and is in afib today. His groin hematoma has resolved. He denies any swallowing issues.   Today, he denies symptoms of orthopnea, PND, lower extremity edema, dizziness, presyncope, syncope, snoring, daytime somnolence, bleeding, or neurologic sequela. The patient is tolerating medications without difficulties and is otherwise without complaint today.    Atrial Fibrillation Risk Factors:  he does not have symptoms or diagnosis of sleep apnea. he does not have a history of rheumatic fever. he does not have a history of alcohol use.   he has a BMI of Body mass index is 24.16 kg/m.Marland Kitchen Filed Weights   06/14/22 1118  Weight: 78.6 kg    Family History  Problem Relation Age of Onset   Arthritis Mother    Heart disease Mother        s/p CABG   Hypertension Mother    Alzheimer's disease Mother    Heart attack Mother    Arthritis Father     Hyperlipidemia Father    Heart disease Father        s/p CABG   Stroke Father    Hypertension Father    Heart attack Father    Multiple sclerosis Sister    Diabetes Sister    Lung cancer Paternal Grandmother    Diabetes Sister    Heart attack Sister    Pulmonary embolism Sister        died   Lung cancer Maternal Aunt    Lung cancer Paternal Aunt    Depression Brother        suicide     Atrial Fibrillation Management history:  Previous antiarrhythmic drugs: none Previous cardioversions: 08/05/21 Previous ablations: 05/17/22 CHADS2VASC score: 4 Anticoagulation history: Eliquis   Past Medical History:  Diagnosis Date   Anxiety and depression    Aortic insufficiency 03/11/2022   Echocardiogram 02/2022: EF 70-75, no RWMA, moderate LVH, normal RVSF, normal PASP (RVSP 20), moderate LAE, mild MR, moderate AI, AV sclerosis without stenosis, no aortic root or ascending aorta dilation   Arthritis    knees, c-spine // gets lumbar ESI q 6 mos   Bipolar disorder ([place])    CAD (coronary artery disease)    [place] 08/2009 >> LHC ([facility]) - mLAD 70, Dx 65 >> PCI:  3x15 mm Endeavor DES to mLAD and 2.5x12 mm Endeavor DES to Dx // S/p NSTEMI >> LHC 8/12 ([facility]) - LM ok, LAD prox and mid 40; LAD stent ok, Dx  stent ok, mRCA 60, mLCx 50 >> med Rx // ETT-Echo 2/17 ([facility]): Normal, EF 55-60 at rest   Chicken pox    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[place])    Grief    History of echocardiogram    Echo 3/19: Mild concentric LVH, EF 60-65, normal wall motion, grade 1 diastolic dysfunction, mild AI, mildly dilated aortic root (40 mm), MAC, mild LAE, atrial septal lipomatous hypertrophy // Echo 8/22: EF 55-60, no RWMA, mild LVH, GRII DD, normal RVSF, RVSP 40, mild LAE, trivial MR, mild-moderate AI, AV sclerosis without stenosis, mild dilation of aortic root (39 mm)   History of non-ST elevation myocardial infarction (NSTEMI)    History of nuclear stress test    Nuclear stress test 3/19: EF 57,  inferior/inferoseptal/inferolateral defect consistent with probable soft tissue attenuation (cannot exclude subendocardial scar), no ischemia, intermediate risk >> Echo 3/19 normal EF, normal wall motion   History of pneumonia 2011   History of prostatitis 1990s   Hx of adenomatous colonic polyps 06/22/2016   Hyperlipidemia    Hypertension    Migraines    prior history   PTSD (post-traumatic stress disorder)    Past Surgical History:  Procedure Laterality Date   ATRIAL FIBRILLATION ABLATION N/A 05/17/2022   Procedure: ATRIAL FIBRILLATION ABLATION;  Surgeon: Vickie Epley, MD;  Location: [facility];  Service: Cardiovascular;  Laterality: N/A;   CARDIOVERSION N/A 08/05/2021   Procedure: CARDIOVERSION;  Surgeon: Acie Fredrickson Wonda Cheng, MD;  Location: [facility];  Service: Cardiovascular;  Laterality: N/A;   CERVICAL DISCECTOMY     COLONOSCOPY  03/2017   ELBOW SURGERY Left    ELBOW SURGERY Right    INGUINAL HERNIA REPAIR Bilateral    1996, 1997   KNEE ARTHROSCOPY Right    KNEE CARTILAGE SURGERY Left    NASAL SEPTUM SURGERY     STENT PLACEMENT VASCULAR ([place] [facility])  09/02/2010   TONSILLECTOMY     TOTAL KNEE ARTHROPLASTY Left 08/31/2018   Procedure: TOTAL KNEE ARTHROPLASTY;  Surgeon: Dorna Leitz, MD;  Location: [facility];  Service: Orthopedics;  Laterality: Left;    Current Outpatient Medications  Medication Sig Dispense Refill   ALPRAZolam (XANAX) 1 MG tablet TAKE 1/2 TO 1 TABLET BY MOUTH ONCE DAILY 30 tablet 2   diltiazem (CARDIZEM CD) 180 MG 24 hr capsule TAKE 1 CAPSULE BY MOUTH DAILY 30 capsule 11   DULoxetine (CYMBALTA) 20 MG capsule Take 1 capsule (20 mg total) by mouth daily. 30 capsule 0   isosorbide mononitrate (IMDUR) 30 MG 24 hr tablet TAKE 1 TABLET BY MOUTH DAILY 30 tablet 6   lamoTRIgine (LAMICTAL) 200 MG tablet Take 1 tablet (200 mg total) by mouth daily. For mood control 30 tablet 2   lamoTRIgine (LAMICTAL) 25 MG tablet Take one tablet (25 mg) every night at bedtime.  30 tablet 2   nitroGLYCERIN (NITROSTAT) 0.4 MG SL tablet Place 1 tablet (0.4 mg total) under the tongue every 5 (five) minutes as needed for chest pain. 25 tablet 3   pantoprazole (PROTONIX) 40 MG tablet Take 1 tablet (40 mg total) by mouth daily. 45 tablet 0   predniSONE (DELTASONE) 10 MG tablet 3 tabs by mouth per day for 3 days,2tabs per day for 3 days,1tab per day for 3 days 18 tablet 0   rivaroxaban (XARELTO) 20 MG TABS tablet Take 1 tablet (20 mg total) by mouth daily. 30 tablet 11   rosuvastatin (CRESTOR) 20 MG tablet TAKE 1 TABLET BY MOUTH DAILY 30 tablet 6  colchicine 0.6 MG tablet Take 1 tablet (0.6 mg total) by mouth 2 (two) times daily for 5 days. 10 tablet 0   No current facility-administered medications for this encounter.    Allergies  Allergen Reactions   Ambien [Zolpidem Tartrate] Other (See Comments)    Blackout, memory issues   Codeine     Other reaction(s): Other (See Comments) Memory issues Other reaction(s): Other Memory issues   Ativan [Lorazepam] Other (See Comments)    Made him feel completely out of it fell down   Seroquel [Quetiapine]     SEVERE NIGHTMARES, SLEEPWALK AND NIGHT DRIVE WITH NO RECOLLECTION UPON WAKENING    Social History   Socioeconomic History   Marital status: Widowed    Spouse name: Not on file   Number of children: 2   Years of education: 80   Highest education level: Not on file  Occupational History   Occupation: Retired  Tobacco Use   Smoking status: Never   Smokeless tobacco: Never   Tobacco comments:    Never smoke 06/14/22  Vaping Use   Vaping Use: Never used  Substance and Sexual Activity   Alcohol use: Not Currently    Alcohol/week: 6.0 standard drinks of alcohol    Types: 6 Cans of beer per week    Comment: 3 beers in a sitting   Drug use: No   Sexual activity: Not Currently  Other Topics Concern   Not on file  Social History Narrative   Retired, widowed in 2017    1 son / 1 daughter   2 caffeinated  beverages daily no alcohol or tobacco   Fun: Work out in the yard.   Denies religious beliefs effecting healthcare.    Worked at [employer] for 30 years   Social Determinants of [employer]: Low Risk  (12/23/2021)   Overall Financial Resource Strain (CARDIA)    Difficulty of Paying Living Expenses: Not hard at all  Food Insecurity: No Food Insecurity (12/23/2021)   Hunger Vital Sign    Worried About Running Out of Food in the Last Year: Never true    [place] in the Last Year: Never true  Transportation Needs: No Transportation Needs (12/23/2021)   PRAPARE - Hydrologist (Medical): No    Lack of Transportation (Non-Medical): No  Physical Activity: Inactive (12/23/2021)   Exercise Vital Sign    Days of Exercise per Week: 0 days    Minutes of Exercise per Session: 0 min  Stress: Stress Concern Present (12/23/2021)   [place]    Feeling of Stress : To some extent  Social Connections: Socially Isolated (12/23/2021)   Social Connection and Isolation Panel [NHANES]    Frequency of Communication with Friends and Family: More than three times a week    Frequency of Social Gatherings with Friends and Family: Once a week    Attends Religious Services: Never    Marine scientist or Organizations: No    Attends Archivist Meetings: Never    Marital Status: Widowed  Intimate Partner Violence: Not At Risk (12/23/2021)   Humiliation, Afraid, Rape, and Kick questionnaire    Fear of Current or Ex-Partner: No    Emotionally Abused: No    Physically Abused: No    Sexually Abused: No     ROS- All systems are reviewed and negative except as per the HPI above.  Physical Exam:  Vitals:   06/14/22 1118  BP: (!) 148/90  Pulse: 91  Weight: 78.6 kg  Height: _0  (1.803 m)    GEN- The patient is a well appearing elderly male, alert and oriented x 3 today.    HEENT-head normocephalic, atraumatic, sclera clear, conjunctiva pink, hearing intact, trachea midline. Lungs- Clear to ausculation bilaterally, normal work of breathing Heart- irregular rate and rhythm, no murmurs, rubs or gallops  GI- soft, NT, ND, + BS Extremities- no clubbing, cyanosis, or edema MS- no significant deformity or atrophy Skin- no rash or lesion Psych- euthymic mood, full affect Neuro- strength and sensation are intact   Wt Readings from Last 3 Encounters:  06/14/22 78.6 kg  05/18/22 78 kg  05/17/22 78.5 kg    EKG today demonstrates  Afib, LAFB Vent. rate 91 BPM PR interval * ms QRS duration 90 ms QT/QTcB 354/435 ms  Echo 03/23/21 demonstrated   1. Left ventricular ejection fraction, by estimation, is 55 to 60%. The left ventricle has normal function. The left ventricle has no regional wall motion abnormalities. There is mild left ventricular hypertrophy. Left ventricular diastolic parameters are consistent with Grade II diastolic dysfunction (pseudonormalization).   2. Right ventricular systolic function is normal. The right ventricular  size is mildly enlarged. There is mildly elevated pulmonary artery  systolic pressure. The estimated right ventricular systolic pressure is 13.2 mmHg.   3. Left atrial size was mildly dilated.   4. The mitral valve is normal in structure. Trivial mitral valve  regurgitation. No evidence of mitral stenosis.   5. The aortic valve is tricuspid. Aortic valve regurgitation is mild to  moderate. Mild aortic valve sclerosis is present, with no evidence of aortic valve stenosis.   6. Aortic dilatation noted. There is mild dilatation of the aortic root,  measuring 39 mm.   7. The inferior vena cava is dilated in size with <50% respiratory  variability, suggesting right atrial pressure of 15 mmHg.   Epic records are reviewed at length today  CHA2DS2-VASc Score = 4  The patient's score is based upon: CHF History: 0 HTN History:  1 Diabetes History: 0 Stroke History: 0 Vascular Disease History: 1 Age Score: 2 Gender Score: 0          ASSESSMENT AND PLAN: 1. Persistent Atrial Fibrillation (ICD10:  I48.19) The patient's CHA2DS2-VASc score is 4, indicating a 4.8% annual risk of stroke.   S/p afib ablation 05/17/22 He is in afib today, symptomatically he appears to be paroxysmal. Will have him return next week to assess if he is paroxysmal or persistent. If persistent, will pursue DCCV.  Continue diltiazem 180 mg daily Continue Eliquis 5 mg BID with no missed doses for 3 months post ablation.   2. Secondary Hypercoagulable State (ICD10:  D68.69) The patient is at significant risk for stroke/thromboembolism based upon his CHA2DS2-VASc Score of 4.  Continue Apixaban (Eliquis).   3. HTN Stable, no changes today.  4. CAD No anginal symptoms.   Follow up in the AF clinic next week.    [facility] [address] [place], [place] [address] [phone] 06/14/2022 11:34 AM

## 2022-06-21 ENCOUNTER — Ambulatory Visit (HOSPITAL_COMMUNITY): Payer: Medicare Other | Admitting: Physician Assistant

## 2022-06-21 ENCOUNTER — Ambulatory Visit (INDEPENDENT_AMBULATORY_CARE_PROVIDER_SITE_OTHER): Payer: Medicare Other | Admitting: Licensed Clinical Social Worker

## 2022-06-21 ENCOUNTER — Encounter (HOSPITAL_COMMUNITY): Payer: Self-pay | Admitting: Licensed Clinical Social Worker

## 2022-06-21 DIAGNOSIS — F3162 Bipolar disorder, current episode mixed, moderate: Secondary | ICD-10-CM | POA: Diagnosis not present

## 2022-06-21 NOTE — Progress Notes (Signed)
   THERAPIST PROGRESS NOTE  Session Time: 12:30pm-1:30pm  Participation Level: Active  Behavioral Response: Well GroomedAlertDepressed  Type of Therapy: Individual Therapy  Treatment Goals addressed:  I'm really worried about my sleep issues and how they are effecting my memory and my mind, having more hallucinations, feeling out of it". Justin [name] will improve psychological functioning as evidenced by reduced experience of psychoses, improvement in sleep, and more stabilized mood 3 out of 7 days.  ProgressTowards Goals: Progressing  Interventions: CBT  Summary: Justin [name] is a 76 y.o. male who presents with Bipolar I, mixed moderate.   Suicidal/Homicidal: Nowithout intent/plan  Therapist Response: Justin [name] engaged well in individual session. Clinician utilized CBT to process recent interactions with friends and family. Clinician explored updates on relationship with son. Clinician provided feedback regarding son's attitude toward Justin [name], noting that son does not accept Justin [name] for who he is, because he does not meet the same mold as father-in-law. Clinician processed this and noted that if this is true, it would explain why son is dismissive of Justin [name], why he does not get invited to parties, and why he only hears from son periodically or when son needs something. Clinician identified the importance of valuing those who value him, particularly his neighbors, who have become family.  Justin [name] shared some past hx of childhood and explained why relationship with sister is challenging. Justin [name] shared that sister still blames Justin [name] for his father's death.   Plan: Return again in 2 weeks.  Diagnosis: Bipolar 1 disorder, mixed, moderate ([place])  Collaboration of Care: Psychiatrist AEB reviewed last note from Dr. Adele Schilder  Patient/Guardian was advised Release of Information must be obtained prior to any record release in order to collaborate their care with an outside provider. Patient/Guardian was advised if they have  not already done so to contact the registration department to sign all necessary forms in order for [place] to release information regarding their care.   Consent: Patient/Guardian gives verbal consent for treatment and assignment of benefits for services provided during this visit. Patient/Guardian expressed understanding and agreed to proceed.   Justin Marker [place], LCSW 06/21/2022

## 2022-06-22 ENCOUNTER — Inpatient Hospital Stay (HOSPITAL_COMMUNITY): Admission: RE | Admit: 2022-06-22 | Payer: Medicare Other | Source: Ambulatory Visit | Admitting: Physician Assistant

## 2022-06-28 ENCOUNTER — Encounter (HOSPITAL_COMMUNITY): Payer: Self-pay | Admitting: Physician Assistant

## 2022-06-28 ENCOUNTER — Inpatient Hospital Stay (HOSPITAL_COMMUNITY)
Admission: RE | Admit: 2022-06-28 | Discharge: 2022-06-28 | Disposition: A | Discharge status: Home / Self Care | Payer: Medicare Other | Source: Ambulatory Visit | Attending: Physician Assistant | Admitting: Physician Assistant

## 2022-06-28 ENCOUNTER — Ambulatory Visit (HOSPITAL_COMMUNITY)
Admission: RE | Admit: 2022-06-28 | Discharge: 2022-06-28 | Disposition: A | Discharge status: Home / Self Care | Payer: Medicare Other | Source: Ambulatory Visit | Attending: Physician Assistant | Admitting: Physician Assistant

## 2022-06-28 VITALS — BP 166/98 | HR 97 | Ht 71.0 in | Wt 175.6 lb

## 2022-06-28 DIAGNOSIS — Z79899 Other long term (current) drug therapy: Secondary | ICD-10-CM | POA: Diagnosis not present

## 2022-06-28 DIAGNOSIS — I10 Essential (primary) hypertension: Secondary | ICD-10-CM | POA: Insufficient documentation

## 2022-06-28 DIAGNOSIS — D6869 Other thrombophilia: Secondary | ICD-10-CM | POA: Diagnosis not present

## 2022-06-28 DIAGNOSIS — E785 Hyperlipidemia, unspecified: Secondary | ICD-10-CM | POA: Insufficient documentation

## 2022-06-28 DIAGNOSIS — I2510 Atherosclerotic heart disease of native coronary artery without angina pectoris: Secondary | ICD-10-CM | POA: Insufficient documentation

## 2022-06-28 DIAGNOSIS — Z8249 Family history of ischemic heart disease and other diseases of the circulatory system: Secondary | ICD-10-CM | POA: Diagnosis not present

## 2022-06-28 DIAGNOSIS — I4819 Other persistent atrial fibrillation: Secondary | ICD-10-CM | POA: Insufficient documentation

## 2022-06-28 DIAGNOSIS — Z7901 Long term (current) use of anticoagulants: Secondary | ICD-10-CM | POA: Diagnosis not present

## 2022-06-28 DIAGNOSIS — I4891 Unspecified atrial fibrillation: Secondary | ICD-10-CM

## 2022-06-28 NOTE — Progress Notes (Signed)
  Primary Care Physician: John, James W, MD Primary Cardiologist: Dr Chandrasekhar Primary Electrophysiologist: Dr Lambert Referring Physician: Dr Chandrasekhar   Justin [name] is a 76 y.o. male with a history of CAD, HTN, HLD, atrial fibrillation who presents for follow up in the [facility].  The patient was initially diagnosed with atrial fibrillation 05/06/21 at Dr Chandrasekhar's office. He wore a cardiac monitor which showed 100% rate controlled afib. He does feel palpitations and "swimmy headed" while in afib. He also has more SOB and fatigue. Patient is on Eliquis for a CHADS2VASC score of 4. Patient underwent ankle surgery on 06/08/21. He resumed anticoagulation 06/10/21.   Patient is s/p DCCV on 08/05/21 with quick return of afib. He saw Dr Lambert and had an afib ablation 05/17/22.   On follow up today, patient reports that he has had several "good days" and "bad days" since his last visit. There does not appear to be a pattern or specific triggers for his symptoms. He is in rate controlled afib today.   Today, he denies symptoms of chest pain, orthopnea, PND, lower extremity edema, dizziness, presyncope, syncope, snoring, daytime somnolence, bleeding, or neurologic sequela. The patient is tolerating medications without difficulties and is otherwise without complaint today.    Atrial Fibrillation Risk Factors:  he does not have symptoms or diagnosis of sleep apnea. he does not have a history of rheumatic fever. he does not have a history of alcohol use.   he has a BMI of Body mass index is 24.49 kg/m.. Filed Weights   06/28/22 1414  Weight: 79.7 kg    Family History  Problem Relation Age of Onset   Arthritis Mother    Heart disease Mother        s/p CABG   Hypertension Mother    Alzheimer's disease Mother    Heart attack Mother    Arthritis Father    Hyperlipidemia Father    Heart disease Father        s/p CABG   Stroke Father     Hypertension Father    Heart attack Father    Multiple sclerosis Sister    Diabetes Sister    Lung cancer Paternal Grandmother    Diabetes Sister    Heart attack Sister    Pulmonary embolism Sister        died   Lung cancer Maternal Aunt    Lung cancer Paternal Aunt    Depression Brother        suicide     Atrial Fibrillation Management history:  Previous antiarrhythmic drugs: none Previous cardioversions: 08/05/21 Previous ablations: 05/17/22 CHADS2VASC score: 4 Anticoagulation history: Eliquis   Past Medical History:  Diagnosis Date   Anxiety and depression    Aortic insufficiency 03/11/2022   Echocardiogram 02/2022: EF 70-75, no RWMA, moderate LVH, normal RVSF, normal PASP (RVSP 20), moderate LAE, mild MR, moderate AI, AV sclerosis without stenosis, no aortic root or ascending aorta dilation   Arthritis    knees, c-spine // gets lumbar ESI q 6 mos   Bipolar disorder (HCC)    CAD (coronary artery disease)    [place] 08/2009 >> [facility] ([facility]) - mLAD 70, Dx 65 >> PCI:  3x15 mm Endeavor DES to mLAD and 2.5x12 mm Endeavor DES to Dx // S/p NSTEMI >> [facility] 8/12 ([facility]) - LM ok, LAD prox and mid 40; LAD stent ok, Dx stent ok, mRCA 30, mLCx 50 >> med Rx // ETT-Echo 2/17 ([facility]):   Normal, EF 55-60 at rest   Chicken pox    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HCC)    Grief    History of echocardiogram    Echo 3/19: Mild concentric LVH, EF 60-65, normal wall motion, grade 1 diastolic dysfunction, mild AI, mildly dilated aortic root (40 mm), MAC, mild LAE, atrial septal lipomatous hypertrophy // Echo 8/22: EF 55-60, no RWMA, mild LVH, GRII DD, normal RVSF, RVSP 40, mild LAE, trivial MR, mild-moderate AI, AV sclerosis without stenosis, mild dilation of aortic root (39 mm)   History of non-ST elevation myocardial infarction (NSTEMI)    History of nuclear stress test    Nuclear stress test 3/19: EF 57, inferior/inferoseptal/inferolateral defect consistent with probable soft tissue attenuation  (cannot exclude subendocardial scar), no ischemia, intermediate risk >> Echo 3/19 normal EF, normal wall motion   History of pneumonia 2011   History of prostatitis 1990s   Hx of adenomatous colonic polyps 06/22/2016   Hyperlipidemia    Hypertension    Migraines    prior history   PTSD (post-traumatic stress disorder)    Past Surgical History:  Procedure Laterality Date   ATRIAL FIBRILLATION ABLATION N/A 05/17/2022   Procedure: ATRIAL FIBRILLATION ABLATION;  Surgeon: Lambert, Cameron T, MD;  Location: [facility];  Service: Cardiovascular;  Laterality: N/A;   CARDIOVERSION N/A 08/05/2021   Procedure: CARDIOVERSION;  Surgeon: Nahser, Philip J, MD;  Location: [facility];  Service: Cardiovascular;  Laterality: N/A;   CERVICAL DISCECTOMY     COLONOSCOPY  03/2017   ELBOW SURGERY Left    ELBOW SURGERY Right    INGUINAL HERNIA REPAIR Bilateral    1996, 1997   KNEE ARTHROSCOPY Right    KNEE CARTILAGE SURGERY Left    NASAL SEPTUM SURGERY     STENT PLACEMENT VASCULAR ([facility] HX)  09/02/2010   TONSILLECTOMY     TOTAL KNEE ARTHROPLASTY Left 08/31/2018   Procedure: TOTAL KNEE ARTHROPLASTY;  Surgeon: Graves, John, MD;  Location: [facility];  Service: Orthopedics;  Laterality: Left;    Current Outpatient Medications  Medication Sig Dispense Refill   ALPRAZolam (XANAX) 1 MG tablet TAKE 1/2 TO 1 TABLET BY MOUTH ONCE DAILY 30 tablet 2   colchicine 0.6 MG tablet Take 1 tablet (0.6 mg total) by mouth 2 (two) times daily for 5 days. 10 tablet 0   diltiazem (CARDIZEM CD) 180 MG 24 hr capsule TAKE 1 CAPSULE BY MOUTH DAILY 30 capsule 11   DULoxetine (CYMBALTA) 20 MG capsule Take 1 capsule (20 mg total) by mouth daily. 30 capsule 0   isosorbide mononitrate (IMDUR) 30 MG 24 hr tablet TAKE 1 TABLET BY MOUTH DAILY 30 tablet 6   lamoTRIgine (LAMICTAL) 200 MG tablet Take 1 tablet (200 mg total) by mouth daily. For mood control 30 tablet 2   lamoTRIgine (LAMICTAL) 25 MG tablet Take one tablet (25 mg) every  night at bedtime. 30 tablet 2   nitroGLYCERIN (NITROSTAT) 0.4 MG SL tablet Place 1 tablet (0.4 mg total) under the tongue every 5 (five) minutes as needed for chest pain. 25 tablet 3   pantoprazole (PROTONIX) 40 MG tablet Take 1 tablet (40 mg total) by mouth daily. 45 tablet 0   rivaroxaban (XARELTO) 20 MG TABS tablet Take 1 tablet (20 mg total) by mouth daily. 30 tablet 11   rosuvastatin (CRESTOR) 20 MG tablet TAKE 1 TABLET BY MOUTH DAILY 30 tablet 6   No current facility-administered medications for this encounter.    Allergies  Allergen Reactions   Ambien [  Zolpidem Tartrate] Other (See Comments)    Blackout, memory issues   Codeine     Other reaction(s): Other (See Comments) Memory issues Other reaction(s): Other Memory issues   Ativan [Lorazepam] Other (See Comments)    Made him feel completely out of it fell down   Seroquel [Quetiapine]     SEVERE NIGHTMARES, SLEEPWALK AND NIGHT DRIVE WITH NO RECOLLECTION UPON WAKENING    Social History   Socioeconomic History   Marital status: Widowed    Spouse name: Not on file   Number of children: 2   Years of education: 12   Highest education level: Not on file  Occupational History   Occupation: Retired  Tobacco Use   Smoking status: Never   Smokeless tobacco: Never   Tobacco comments:    Never smoke 06/14/22  Vaping Use   Vaping Use: Never used  Substance and Sexual Activity   Alcohol use: Not Currently    Alcohol/week: 6.0 standard drinks of alcohol    Types: 6 Cans of beer per week    Comment: 3 beers in a sitting   Drug use: No   Sexual activity: Not Currently  Other Topics Concern   Not on file  Social History Narrative   Retired, widowed in 2017    1 son / 1 daughter   2 caffeinated beverages daily no alcohol or tobacco   Fun: Work out in the yard.   Denies religious beliefs effecting healthcare.    Worked at [employer] for 30 years   Social Determinants of Health   Financial Resource Strain: Low Risk   (12/23/2021)   Overall Financial Resource Strain (CARDIA)    Difficulty of Paying Living Expenses: Not hard at all  Food Insecurity: No Food Insecurity (12/23/2021)   Hunger Vital Sign    Worried About Running Out of Food in the Last Year: Never true    Ran Out of Food in the Last Year: Never true  Transportation Needs: No Transportation Needs (12/23/2021)   PRAPARE - Transportation    Lack of Transportation (Medical): No    Lack of Transportation (Non-Medical): No  Physical Activity: Inactive (12/23/2021)   Exercise Vital Sign    Days of Exercise per Week: 0 days    Minutes of Exercise per Session: 0 min  Stress: Stress Concern Present (12/23/2021)   [employer] - Occupational Stress Questionnaire    Feeling of Stress : To some extent  Social Connections: Socially Isolated (12/23/2021)   Social Connection and Isolation Panel [NHANES]    Frequency of Communication with Friends and Family: More than three times a week    Frequency of Social Gatherings with Friends and Family: Once a week    Attends Religious Services: Never    Active Member of Clubs or Organizations: No    Attends Club or Organization Meetings: Never    Marital Status: Widowed  Intimate Partner Violence: Not At Risk (12/23/2021)   Humiliation, Afraid, Rape, and Kick questionnaire    Fear of Current or Ex-Partner: No    Emotionally Abused: No    Physically Abused: No    Sexually Abused: No     ROS- All systems are reviewed and negative except as per the HPI above.  Physical Exam: Vitals:   06/28/22 1414  BP: (!) 166/98  Pulse: 97  SpO2: 100%  Weight: 79.7 kg  Height: 5' 11" (1.803 m)     GEN- The patient is a well appearing elderly male, alert and oriented   x 3 today.   HEENT-head normocephalic, atraumatic, sclera clear, conjunctiva pink, hearing intact, trachea midline. Lungs- Clear to ausculation bilaterally, normal work of breathing Heart- irregular rate and rhythm, no murmurs, rubs  or gallops  GI- soft, NT, ND, + BS Extremities- no clubbing, cyanosis, or edema MS- no significant deformity or atrophy Skin- no rash or lesion Psych- euthymic mood, full affect Neuro- strength and sensation are intact   Wt Readings from Last 3 Encounters:  06/28/22 79.7 kg  06/14/22 78.6 kg  05/18/22 78 kg    EKG today demonstrates  Afib, LAFB Vent. rate 86 BPM PR interval * ms QRS duration 92 ms QT/QTcB 370/442 ms  Echo 03/23/21 demonstrated   1. Left ventricular ejection fraction, by estimation, is 55 to 60%. The left ventricle has normal function. The left ventricle has no regional wall motion abnormalities. There is mild left ventricular hypertrophy. Left ventricular diastolic parameters are consistent with Grade II diastolic dysfunction (pseudonormalization).   2. Right ventricular systolic function is normal. The right ventricular  size is mildly enlarged. There is mildly elevated pulmonary artery  systolic pressure. The estimated right ventricular systolic pressure is 40.0 mmHg.   3. Left atrial size was mildly dilated.   4. The mitral valve is normal in structure. Trivial mitral valve  regurgitation. No evidence of mitral stenosis.   5. The aortic valve is tricuspid. Aortic valve regurgitation is mild to  moderate. Mild aortic valve sclerosis is present, with no evidence of aortic valve stenosis.   6. Aortic dilatation noted. There is mild dilatation of the aortic root,  measuring 39 mm.   7. The inferior vena cava is dilated in size with <50% respiratory  variability, suggesting right atrial pressure of 15 mmHg.   Epic records are reviewed at length today  CHA2DS2-VASc Score = 4  The patient's score is based upon: CHF History: 0 HTN History: 1 Diabetes History: 0 Stroke History: 0 Vascular Disease History: 1 Age Score: 2 Gender Score: 0       ASSESSMENT AND PLAN: 1. Persistent Atrial Fibrillation (ICD10:  I48.19) The patient's CHA2DS2-VASc score is 4,  indicating a 4.8% annual risk of stroke.   S/p afib ablation 05/17/22 Patient in afib today. Unclear the duration of his afib given prior to his ablation he could not tell if he was in or out of rhythm. Will have him wear a Zio monitor to determine if he is persistent or paroxysmal. If persistent, will pursue DCCV. If paroxysmal and having a high buren, may consider short term AAD as he heals from ablation.  Continue diltiazem 180 mg daily Continue Eliquis 5 mg BID with no missed doses for 3 months post ablation.   2. Secondary Hypercoagulable State (ICD10:  D68.69) The patient is at significant risk for stroke/thromboembolism based upon his CHA2DS2-VASc Score of 4.  Continue Apixaban (Eliquis).   3. HTN Elevated today, will reassess in SR.  4. CAD No anginal symptoms.   Follow up in the AF clinic pending monitor results. Follow up with Dr Lambert as scheduled.    Ricky Riley Papin PA-C [facility] [address] [place], [place] [address] [phone] 06/28/2022 2:31 PM 

## 2022-06-28 NOTE — H&P (View-Only) (Signed)
Primary Care Physician: Biagio Borg, MD Primary Cardiologist: Dr Gasper Sells Primary Electrophysiologist: Dr Quentin Ore Referring Physician: Dr Ollen Gross is a 76 y.o. male with a history of CAD, HTN, HLD, atrial fibrillation who presents for follow up in the [facility].  The patient was initially diagnosed with atrial fibrillation 05/06/21 at Dr Oralia Rud office. He wore a cardiac monitor which showed 100% rate controlled afib. He does feel palpitations and "swimmy headed" while in afib. He also has more SOB and fatigue. Patient is on Eliquis for a CHADS2VASC score of 4. Patient underwent ankle surgery on 06/08/21. He resumed anticoagulation 06/10/21.   Patient is s/p DCCV on 08/05/21 with quick return of afib. He saw Dr Quentin Ore and had an afib ablation 05/17/22.   On follow up today, patient reports that he has had several "good days" and "bad days" since his last visit. There does not appear to be a pattern or specific triggers for his symptoms. He is in rate controlled afib today.   Today, he denies symptoms of chest pain, orthopnea, PND, lower extremity edema, dizziness, presyncope, syncope, snoring, daytime somnolence, bleeding, or neurologic sequela. The patient is tolerating medications without difficulties and is otherwise without complaint today.    Atrial Fibrillation Risk Factors:  he does not have symptoms or diagnosis of sleep apnea. he does not have a history of rheumatic fever. he does not have a history of alcohol use.   he has a BMI of Body mass index is 24.49 kg/m.Marland Kitchen Filed Weights   06/28/22 1414  Weight: 79.7 kg    Family History  Problem Relation Age of Onset   Arthritis Mother    Heart disease Mother        s/p CABG   Hypertension Mother    Alzheimer's disease Mother    Heart attack Mother    Arthritis Father    Hyperlipidemia Father    Heart disease Father        s/p CABG   Stroke Father     Hypertension Father    Heart attack Father    Multiple sclerosis Sister    Diabetes Sister    Lung cancer Paternal Grandmother    Diabetes Sister    Heart attack Sister    Pulmonary embolism Sister        died   Lung cancer Maternal Aunt    Lung cancer Paternal Aunt    Depression Brother        suicide     Atrial Fibrillation Management history:  Previous antiarrhythmic drugs: none Previous cardioversions: 08/05/21 Previous ablations: 05/17/22 CHADS2VASC score: 4 Anticoagulation history: Eliquis   Past Medical History:  Diagnosis Date   Anxiety and depression    Aortic insufficiency 03/11/2022   Echocardiogram 02/2022: EF 70-75, no RWMA, moderate LVH, normal RVSF, normal PASP (RVSP 20), moderate LAE, mild MR, moderate AI, AV sclerosis without stenosis, no aortic root or ascending aorta dilation   Arthritis    knees, c-spine // gets lumbar ESI q 6 mos   Bipolar disorder ([place])    CAD (coronary artery disease)    [place] 08/2009 >> LHC ([facility]) - mLAD 70, Dx 65 >> PCI:  3x15 mm Endeavor DES to mLAD and 2.5x12 mm Endeavor DES to Dx // S/p NSTEMI >> LHC 8/12 ([facility]) - LM ok, LAD prox and mid 40; LAD stent ok, Dx stent ok, mRCA 5, mLCx 50 >> med Rx // ETT-Echo 2/17 ([facility]):  Normal, EF 55-60 at rest   Chicken pox    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[place])    Grief    History of echocardiogram    Echo 3/19: Mild concentric LVH, EF 60-65, normal wall motion, grade 1 diastolic dysfunction, mild AI, mildly dilated aortic root (40 mm), MAC, mild LAE, atrial septal lipomatous hypertrophy // Echo 8/22: EF 55-60, no RWMA, mild LVH, GRII DD, normal RVSF, RVSP 40, mild LAE, trivial MR, mild-moderate AI, AV sclerosis without stenosis, mild dilation of aortic root (39 mm)   History of non-ST elevation myocardial infarction (NSTEMI)    History of nuclear stress test    Nuclear stress test 3/19: EF 57, inferior/inferoseptal/inferolateral defect consistent with probable soft tissue attenuation  (cannot exclude subendocardial scar), no ischemia, intermediate risk >> Echo 3/19 normal EF, normal wall motion   History of pneumonia 2011   History of prostatitis 1990s   Hx of adenomatous colonic polyps 06/22/2016   Hyperlipidemia    Hypertension    Migraines    prior history   PTSD (post-traumatic stress disorder)    Past Surgical History:  Procedure Laterality Date   ATRIAL FIBRILLATION ABLATION N/A 05/17/2022   Procedure: ATRIAL FIBRILLATION ABLATION;  Surgeon: Vickie Epley, MD;  Location: [facility];  Service: Cardiovascular;  Laterality: N/A;   CARDIOVERSION N/A 08/05/2021   Procedure: CARDIOVERSION;  Surgeon: Acie Fredrickson Wonda Cheng, MD;  Location: [facility];  Service: Cardiovascular;  Laterality: N/A;   CERVICAL DISCECTOMY     COLONOSCOPY  03/2017   ELBOW SURGERY Left    ELBOW SURGERY Right    INGUINAL HERNIA REPAIR Bilateral    1996, 1997   KNEE ARTHROSCOPY Right    KNEE CARTILAGE SURGERY Left    NASAL SEPTUM SURGERY     STENT PLACEMENT VASCULAR ([facility])  09/02/2010   TONSILLECTOMY     TOTAL KNEE ARTHROPLASTY Left 08/31/2018   Procedure: TOTAL KNEE ARTHROPLASTY;  Surgeon: Dorna Leitz, MD;  Location: [facility];  Service: Orthopedics;  Laterality: Left;    Current Outpatient Medications  Medication Sig Dispense Refill   ALPRAZolam (XANAX) 1 MG tablet TAKE 1/2 TO 1 TABLET BY MOUTH ONCE DAILY 30 tablet 2   colchicine 0.6 MG tablet Take 1 tablet (0.6 mg total) by mouth 2 (two) times daily for 5 days. 10 tablet 0   diltiazem (CARDIZEM CD) 180 MG 24 hr capsule TAKE 1 CAPSULE BY MOUTH DAILY 30 capsule 11   DULoxetine (CYMBALTA) 20 MG capsule Take 1 capsule (20 mg total) by mouth daily. 30 capsule 0   isosorbide mononitrate (IMDUR) 30 MG 24 hr tablet TAKE 1 TABLET BY MOUTH DAILY 30 tablet 6   lamoTRIgine (LAMICTAL) 200 MG tablet Take 1 tablet (200 mg total) by mouth daily. For mood control 30 tablet 2   lamoTRIgine (LAMICTAL) 25 MG tablet Take one tablet (25 mg) every  night at bedtime. 30 tablet 2   nitroGLYCERIN (NITROSTAT) 0.4 MG SL tablet Place 1 tablet (0.4 mg total) under the tongue every 5 (five) minutes as needed for chest pain. 25 tablet 3   pantoprazole (PROTONIX) 40 MG tablet Take 1 tablet (40 mg total) by mouth daily. 45 tablet 0   rivaroxaban (XARELTO) 20 MG TABS tablet Take 1 tablet (20 mg total) by mouth daily. 30 tablet 11   rosuvastatin (CRESTOR) 20 MG tablet TAKE 1 TABLET BY MOUTH DAILY 30 tablet 6   No current facility-administered medications for this encounter.    Allergies  Allergen Reactions   Ambien [  Zolpidem Tartrate] Other (See Comments)    Blackout, memory issues   Codeine     Other reaction(s): Other (See Comments) Memory issues Other reaction(s): Other Memory issues   Ativan [Lorazepam] Other (See Comments)    Made him feel completely out of it fell down   Seroquel [Quetiapine]     SEVERE NIGHTMARES, SLEEPWALK AND NIGHT DRIVE WITH NO RECOLLECTION UPON WAKENING    Social History   Socioeconomic History   Marital status: Widowed    Spouse name: Not on file   Number of children: 2   Years of education: 62   Highest education level: Not on file  Occupational History   Occupation: Retired  Tobacco Use   Smoking status: Never   Smokeless tobacco: Never   Tobacco comments:    Never smoke 06/14/22  Vaping Use   Vaping Use: Never used  Substance and Sexual Activity   Alcohol use: Not Currently    Alcohol/week: 6.0 standard drinks of alcohol    Types: 6 Cans of beer per week    Comment: 3 beers in a sitting   Drug use: No   Sexual activity: Not Currently  Other Topics Concern   Not on file  Social History Narrative   Retired, widowed in 2017    1 son / 1 daughter   2 caffeinated beverages daily no alcohol or tobacco   Fun: Work out in the yard.   Denies religious beliefs effecting healthcare.    Worked at [employer] for 30 years   Social Determinants of [employer]: Low Risk   (12/23/2021)   Overall Financial Resource Strain (CARDIA)    Difficulty of Paying Living Expenses: Not hard at all  Food Insecurity: No Food Insecurity (12/23/2021)   Hunger Vital Sign    Worried About Running Out of Food in the Last Year: Never true    [place] in the Last Year: Never true  Transportation Needs: No Transportation Needs (12/23/2021)   PRAPARE - Hydrologist (Medical): No    Lack of Transportation (Non-Medical): No  Physical Activity: Inactive (12/23/2021)   Exercise Vital Sign    Days of Exercise per Week: 0 days    Minutes of Exercise per Session: 0 min  Stress: Stress Concern Present (12/23/2021)   [place]    Feeling of Stress : To some extent  Social Connections: Socially Isolated (12/23/2021)   Social Connection and Isolation Panel [NHANES]    Frequency of Communication with Friends and Family: More than three times a week    Frequency of Social Gatherings with Friends and Family: Once a week    Attends Religious Services: Never    Marine scientist or Organizations: No    Attends Archivist Meetings: Never    Marital Status: Widowed  Intimate Partner Violence: Not At Risk (12/23/2021)   Humiliation, Afraid, Rape, and Kick questionnaire    Fear of Current or Ex-Partner: No    Emotionally Abused: No    Physically Abused: No    Sexually Abused: No     ROS- All systems are reviewed and negative except as per the HPI above.  Physical Exam: Vitals:   06/28/22 1414  BP: (!) 166/98  Pulse: 97  SpO2: 100%  Weight: 79.7 kg  Height: _0  (1.803 m)     GEN- The patient is a well appearing elderly male, alert and oriented  x 3 today.   HEENT-head normocephalic, atraumatic, sclera clear, conjunctiva pink, hearing intact, trachea midline. Lungs- Clear to ausculation bilaterally, normal work of breathing Heart- irregular rate and rhythm, no murmurs, rubs  or gallops  GI- soft, NT, ND, + BS Extremities- no clubbing, cyanosis, or edema MS- no significant deformity or atrophy Skin- no rash or lesion Psych- euthymic mood, full affect Neuro- strength and sensation are intact   Wt Readings from Last 3 Encounters:  06/28/22 79.7 kg  06/14/22 78.6 kg  05/18/22 78 kg    EKG today demonstrates  Afib, LAFB Vent. rate 86 BPM PR interval * ms QRS duration 92 ms QT/QTcB 370/442 ms  Echo 03/23/21 demonstrated   1. Left ventricular ejection fraction, by estimation, is 55 to 60%. The left ventricle has normal function. The left ventricle has no regional wall motion abnormalities. There is mild left ventricular hypertrophy. Left ventricular diastolic parameters are consistent with Grade II diastolic dysfunction (pseudonormalization).   2. Right ventricular systolic function is normal. The right ventricular  size is mildly enlarged. There is mildly elevated pulmonary artery  systolic pressure. The estimated right ventricular systolic pressure is 28.3 mmHg.   3. Left atrial size was mildly dilated.   4. The mitral valve is normal in structure. Trivial mitral valve  regurgitation. No evidence of mitral stenosis.   5. The aortic valve is tricuspid. Aortic valve regurgitation is mild to  moderate. Mild aortic valve sclerosis is present, with no evidence of aortic valve stenosis.   6. Aortic dilatation noted. There is mild dilatation of the aortic root,  measuring 39 mm.   7. The inferior vena cava is dilated in size with <50% respiratory  variability, suggesting right atrial pressure of 15 mmHg.   Epic records are reviewed at length today  CHA2DS2-VASc Score = 4  The patient's score is based upon: CHF History: 0 HTN History: 1 Diabetes History: 0 Stroke History: 0 Vascular Disease History: 1 Age Score: 2 Gender Score: 0       ASSESSMENT AND PLAN: 1. Persistent Atrial Fibrillation (ICD10:  I48.19) The patient's CHA2DS2-VASc score is 4,  indicating a 4.8% annual risk of stroke.   S/p afib ablation 05/17/22 Patient in afib today. Unclear the duration of his afib given prior to his ablation he could not tell if he was in or out of rhythm. Will have him wear a Zio monitor to determine if he is persistent or paroxysmal. If persistent, will pursue DCCV. If paroxysmal and having a high buren, may consider short term AAD as he heals from ablation.  Continue diltiazem 180 mg daily Continue Eliquis 5 mg BID with no missed doses for 3 months post ablation.   2. Secondary Hypercoagulable State (ICD10:  D68.69) The patient is at significant risk for stroke/thromboembolism based upon his CHA2DS2-VASc Score of 4.  Continue Apixaban (Eliquis).   3. HTN Elevated today, will reassess in SR.  4. CAD No anginal symptoms.   Follow up in the AF clinic pending monitor results. Follow up with Dr Quentin Ore as scheduled.    [facility] [address] [place], [place] [address] [phone] 06/28/2022 2:31 PM

## 2022-07-04 ENCOUNTER — Other Ambulatory Visit: Payer: Self-pay | Admitting: Physician Assistant

## 2022-07-06 ENCOUNTER — Telehealth (HOSPITAL_BASED_OUTPATIENT_CLINIC_OR_DEPARTMENT_OTHER): Payer: Medicare Other | Admitting: Psychiatry

## 2022-07-06 ENCOUNTER — Encounter (HOSPITAL_COMMUNITY): Payer: Self-pay | Admitting: Psychiatry

## 2022-07-06 DIAGNOSIS — F419 Anxiety disorder, unspecified: Secondary | ICD-10-CM

## 2022-07-06 DIAGNOSIS — F3162 Bipolar disorder, current episode mixed, moderate: Secondary | ICD-10-CM | POA: Diagnosis not present

## 2022-07-06 DIAGNOSIS — F4310 Post-traumatic stress disorder, unspecified: Secondary | ICD-10-CM

## 2022-07-06 MED ORDER — DULOXETINE HCL 30 MG PO CPEP: 30.0000 mg | ORAL_CAPSULE | Freq: Every day | ORAL | 1 refills | Status: DC

## 2022-07-06 NOTE — Progress Notes (Signed)
Virtual Visit via Telephone Note  I connected with Justin [name] on 07/06/22 at  1:00 PM EST by telephone and verified that I am speaking with the correct person using two identifiers.  Location: Patient: Home Provider: Home Office   I discussed the limitations, risks, security and privacy concerns of performing an evaluation and management service by telephone and the availability of in person appointments. I also discussed with the patient that there may be a patient responsible charge related to this service. The patient expressed understanding and agreed to proceed.   History of Present Illness: Patient is evaluated by phone session.  We started him on low-dose Cymbalta on the last visit to help his anxiety and depression.  He noticed improvement in his nervousness and depressive thoughts.  He is more active and started to walk the dog every day.  He also happy that his son communicated with him.  However patient is concerned about recent episode of A-fib.  He admitted he was noncompliant with his blood pressure medication because he has no refills.  He was without the medicine for a few days until Monday he failed his medication.  He admitted things are much better and he is not feeling as nervous or anxious.  His main issue is chronic insomnia as he continued to have nightmares and flashback.  He is in therapy with Janett Billow.  He is taking Xanax prescribed by PCP.  He is also compliant with Lamictal 225 mg.  He denies any mania, psychosis, hallucination or any suicidal thoughts.  His plan is to spend Thanksgiving with his sister.  Since his wife died 7 years ago he do not celebrate Thanksgiving.  He has no tremors, shakes or any EPS.  He was wearing a heart monitor for 1 week and just current device to the company.  He is not sure what will be the results but if he continues to have A-fib then he may need another procedure for the heart.   Past Psychiatric History:  H/O suicidal attempt by  overdose on Ambien twice, hydroxyzine and alcohol. H/O bipolar disorder with multiple inpatient.  Did IOP. Last ER visit Sept 2020 and inpatient October 2018 at [facility]. Tried Zyprexa, Cymbalta, Seroquel, Vistaril, Neurontin, Wellbutrin and triptal. Tried low-dose doxepin and trazodone. Had a reaction with Ativan and pain medication.  Saw Dr. Letta Moynahan in past.  Psychiatric Specialty Exam: Physical Exam  Review of Systems  Weight 175 lb (79.4 kg).There is no height or weight on file to calculate BMI.  General Appearance: NA  Eye Contact:  NA  Speech:  Slow  Volume:  Decreased  Mood:  Anxious  Affect:  NA  Thought Process:  Goal Directed  Orientation:  Full (Time, Place, and Person)  Thought Content:  Rumination  Suicidal Thoughts:  No  Homicidal Thoughts:  No  Memory:  Immediate;   Good Recent;   Good Remote;   Fair  Judgement:  Intact  Insight:  Shallow  Psychomotor Activity:  NA  Concentration:  Concentration: Fair and Attention Span: Fair  Recall:  [employer] of Knowledge:  Good  Language:  Good  Akathisia:  No  Handed:  Right  AIMS (if indicated):     Assets:  Communication Skills Desire for Improvement Housing Transportation  ADL's:  Intact  Cognition:  WNL  Sleep:   fair      Assessment and Plan: PTSD.  Bipolar disorder type I.  Anxiety.  Patient doing better with low-dose Cymbalta 20 mg.  I recommend to increase to 30 mg daily.  His overall depression is better but now he is anxious about his general health.  He had A-fib because he was out of his medication for a few days but now he feels things are going well and he had appointment with a cardiologist.  If he continues to have A-fib he may need a cardiac procedure.  I encourage if he is running low on his medication should call the office sooner than later to avoid any missing the medication.  He also agreed to try higher dose of Cymbalta since it is helping and he has no side effects.  Try Cymbalta 30 mg  daily, continue Lamictal 225 mg daily.  I recommend to call us back if is any question or any concern.  We will follow-up in 2 months.  Encouraged to continue therapy with Janett Billow for coping skills.  Follow Up Instructions:    I discussed the assessment and treatment plan with the patient. The patient was provided an opportunity to ask questions and all were answered. The patient agreed with the plan and demonstrated an understanding of the instructions.   The patient was advised to call back or seek an in-person evaluation if the symptoms worsen or if the condition fails to improve as anticipated.  Collaboration of Care: Other provider involved in patient's care AEB notes are available in epic to review.  Patient/Guardian was advised Release of Information must be obtained prior to any record release in order to collaborate their care with an outside provider. Patient/Guardian was advised if they have not already done so to contact the registration department to sign all necessary forms in order for [place] to release information regarding their care.   Consent: Patient/Guardian gives verbal consent for treatment and assignment of benefits for services provided during this visit. Patient/Guardian expressed understanding and agreed to proceed.    I provided 14 minutes of non-face-to-face time during this encounter.   Kathlee Nations, MD

## 2022-07-07 ENCOUNTER — Ambulatory Visit (HOSPITAL_COMMUNITY): Payer: Medicare Other | Admitting: Licensed Clinical Social Worker

## 2022-07-07 ENCOUNTER — Ambulatory Visit: Payer: Medicare Other

## 2022-07-12 ENCOUNTER — Ambulatory Visit: Payer: Medicare Other

## 2022-07-13 ENCOUNTER — Other Ambulatory Visit (HOSPITAL_COMMUNITY): Payer: Self-pay | Admitting: *Deleted

## 2022-07-13 DIAGNOSIS — I4819 Other persistent atrial fibrillation: Secondary | ICD-10-CM

## 2022-07-19 ENCOUNTER — Encounter (HOSPITAL_COMMUNITY): Payer: Self-pay | Admitting: Licensed Clinical Social Worker

## 2022-07-19 ENCOUNTER — Ambulatory Visit (INDEPENDENT_AMBULATORY_CARE_PROVIDER_SITE_OTHER): Payer: Medicare Other | Admitting: Licensed Clinical Social Worker

## 2022-07-19 DIAGNOSIS — F3162 Bipolar disorder, current episode mixed, moderate: Secondary | ICD-10-CM | POA: Diagnosis not present

## 2022-07-19 NOTE — Progress Notes (Signed)
Virtual Visit via Telephone Note  I connected with Justin [name] on 07/19/22 at  3:30 PM EST by telephone and verified that I am speaking with the correct person using two identifiers.  Location: Patient: home Provider: office   I discussed the limitations, risks, security and privacy concerns of performing an evaluation and management service by telephone and the availability of in person appointments. I also discussed with the patient that there may be a patient responsible charge related to this service. The patient expressed understanding and agreed to proceed.     I discussed the assessment and treatment plan with the patient. The patient was provided an opportunity to ask questions and all were answered. The patient agreed with the plan and demonstrated an understanding of the instructions.   The patient was advised to call back or seek an in-person evaluation if the symptoms worsen or if the condition fails to improve as anticipated.  I provided 45 minutes of non-face-to-face time during this encounter.   [name], LCSW   THERAPIST PROGRESS NOTE  Session Time: 3:30pm-4:15pm  Participation Level: Active  Behavioral Response: NAAlertDepressed  Type of Therapy: Individual Therapy  Treatment Goals addressed: I'm really worried about my sleep issues and how they are effecting my memory and my mind, having more hallucinations, feeling out of it". Justin [name] will improve psychological functioning as evidenced by reduced experience of psychoses, improvement in sleep, and more stabilized mood 3 out of 7 days.   ProgressTowards Goals: Progressing  Interventions: CBT  Summary: Justin [name] is a 76 y.o. male who presents with Bipolar I, mixed, moderate.   Suicidal/Homicidal: Nowithout intent/plan  Therapist Response: Justin [name] engaged well in individual telephone session with clinician. Clinician utilized CBT to process thoughts, feelings, and interactions with family members  over Thanksgiving. Clinician explored Justin [name]'s expectations of his son, as behavioral patterns have been set. Clinician processed options for changing his expectations that son will change, as well as manage his disappointment that things are not different. Clinician discussed health issues, noting that his heart is still in A.Fib. Justin [name] shared possibility that another ablation or some other procedure may be necessary. Justin [name] shared he is willing to go through whatever procedure needs to be done, as he is not ready to die.   Plan: Return again in 2 weeks.  Diagnosis: Bipolar 1 disorder, mixed, moderate ([place])  Collaboration of Care: Patient refused AEB none required in this session  Patient/Guardian was advised Release of Information must be obtained prior to any record release in order to collaborate their care with an outside provider. Patient/Guardian was advised if they have not already done so to contact the registration department to sign all necessary forms in order for [place] to release information regarding their care.   Consent: Patient/Guardian gives verbal consent for treatment and assignment of benefits for services provided during this visit. Patient/Guardian expressed understanding and agreed to proceed.   [place], LCSW 07/19/2022

## 2022-07-25 ENCOUNTER — Other Ambulatory Visit: Payer: Self-pay

## 2022-07-25 ENCOUNTER — Ambulatory Visit: Payer: Medicare Other

## 2022-07-25 ENCOUNTER — Ambulatory Visit (INDEPENDENT_AMBULATORY_CARE_PROVIDER_SITE_OTHER): Payer: Medicare Other

## 2022-07-25 DIAGNOSIS — Z23 Encounter for immunization: Secondary | ICD-10-CM | POA: Diagnosis not present

## 2022-07-25 MED ORDER — ALPRAZOLAM 1 MG PO TABS: ORAL_TABLET | ORAL | 2 refills | Status: DC

## 2022-07-25 NOTE — Telephone Encounter (Signed)
Pt requesting refill

## 2022-07-25 NOTE — Progress Notes (Signed)
After obtaining consent, and per orders of Dr. Jenny Reichmann, injection of High Dose Flu was given in the left deltoid by Marrian Salvage. Patient instructed  to report any adverse reaction to me immediately.

## 2022-07-28 ENCOUNTER — Encounter (HOSPITAL_COMMUNITY)
Admission: RE | Disposition: A | Discharge status: Home / Self Care | Payer: Self-pay | Source: Home / Self Care | Attending: Internal Medicine

## 2022-07-28 ENCOUNTER — Ambulatory Visit (HOSPITAL_COMMUNITY)
Admission: RE | Admit: 2022-07-28 | Discharge: 2022-07-28 | Disposition: A | Discharge status: Home / Self Care | Payer: Medicare Other | Attending: Internal Medicine | Admitting: Internal Medicine

## 2022-07-28 ENCOUNTER — Other Ambulatory Visit: Payer: Self-pay

## 2022-07-28 ENCOUNTER — Ambulatory Visit (HOSPITAL_COMMUNITY)
Admission: RE | Admit: 2022-07-28 | Discharge: 2022-07-28 | Disposition: A | Discharge status: Home / Self Care | Payer: Medicare Other | Source: Ambulatory Visit | Attending: Physician Assistant | Admitting: Physician Assistant

## 2022-07-28 ENCOUNTER — Ambulatory Visit (HOSPITAL_COMMUNITY): Payer: Medicare Other | Admitting: Anesthesiology

## 2022-07-28 ENCOUNTER — Encounter (HOSPITAL_COMMUNITY): Payer: Self-pay | Admitting: Internal Medicine

## 2022-07-28 ENCOUNTER — Ambulatory Visit (HOSPITAL_BASED_OUTPATIENT_CLINIC_OR_DEPARTMENT_OTHER): Payer: Medicare Other | Admitting: Anesthesiology

## 2022-07-28 DIAGNOSIS — Z7901 Long term (current) use of anticoagulants: Secondary | ICD-10-CM | POA: Insufficient documentation

## 2022-07-28 DIAGNOSIS — I2510 Atherosclerotic heart disease of native coronary artery without angina pectoris: Secondary | ICD-10-CM | POA: Insufficient documentation

## 2022-07-28 DIAGNOSIS — I080 Rheumatic disorders of both mitral and aortic valves: Secondary | ICD-10-CM | POA: Diagnosis not present

## 2022-07-28 DIAGNOSIS — M1990 Unspecified osteoarthritis, unspecified site: Secondary | ICD-10-CM | POA: Diagnosis not present

## 2022-07-28 DIAGNOSIS — I4819 Other persistent atrial fibrillation: Secondary | ICD-10-CM

## 2022-07-28 DIAGNOSIS — F319 Bipolar disorder, unspecified: Secondary | ICD-10-CM | POA: Diagnosis not present

## 2022-07-28 DIAGNOSIS — I10 Essential (primary) hypertension: Secondary | ICD-10-CM | POA: Insufficient documentation

## 2022-07-28 DIAGNOSIS — Z79899 Other long term (current) drug therapy: Secondary | ICD-10-CM | POA: Insufficient documentation

## 2022-07-28 DIAGNOSIS — I4891 Unspecified atrial fibrillation: Secondary | ICD-10-CM | POA: Diagnosis not present

## 2022-07-28 DIAGNOSIS — F419 Anxiety disorder, unspecified: Secondary | ICD-10-CM | POA: Diagnosis not present

## 2022-07-28 HISTORY — PX: CARDIOVERSION: SHX1299

## 2022-07-28 LAB — BASIC METABOLIC PANEL
Anion gap: 7 (ref 5–15)
BUN: 12 mg/dL (ref 8–23)
CO2: 21 mmol/L — ABNORMAL LOW (ref 22–32)
Calcium: 9.2 mg/dL (ref 8.9–10.3)
Chloride: 108 mmol/L (ref 98–111)
Creatinine, Ser: 0.94 mg/dL (ref 0.61–1.24)
GFR, Estimated: >60 mL/min (ref >60)
Glucose, Bld: 103 mg/dL — ABNORMAL HIGH (ref 70–99)
Potassium: 4.6 mmol/L (ref 3.5–5.1)
Sodium: 136 mmol/L (ref 135–145)

## 2022-07-28 LAB — CBC
HCT: 44.2 % (ref 39.0–52.0)
Hemoglobin: 14 g/dL (ref 13.0–17.0)
MCH: 33.1 pg (ref 26.0–34.0)
MCHC: 31.7 g/dL (ref 30.0–36.0)
MCV: 104.5 fL — ABNORMAL HIGH (ref 80.0–100.0)
Platelets: 196 10*3/uL (ref 150–400)
RBC: 4.23 MIL/uL (ref 4.22–5.81)
RDW: 12.1 % (ref 11.5–15.5)
WBC: 4.8 10*3/uL (ref 4.0–10.5)
nRBC: 0 % (ref 0.0–0.2)

## 2022-07-28 SURGERY — CARDIOVERSION
Anesthesia: General

## 2022-07-28 MED ORDER — PROPOFOL 10 MG/ML IV BOLUS
INTRAVENOUS | Status: DC | PRN
  Administered 2022-07-28: 80 mg via INTRAVENOUS

## 2022-07-28 MED ORDER — SODIUM CHLORIDE 0.9 % IV SOLN: INTRAVENOUS | Status: DC

## 2022-07-28 MED ORDER — LIDOCAINE 2% (20 MG/ML) 5 ML SYRINGE
INTRAMUSCULAR | Status: DC | PRN
  Administered 2022-07-28: 40 mg via INTRAVENOUS

## 2022-07-28 MED ORDER — SODIUM CHLORIDE 0.9 % IV SOLN
INTRAVENOUS | Status: AC | PRN
  Administered 2022-07-28: 500 mL via INTRAMUSCULAR

## 2022-07-28 NOTE — Progress Notes (Signed)
Patient presents for ECG and labs prior to DCCV. ECG shows  Afib, LAFB Vent. rate 90 BPM PR interval * ms QRS duration 86 ms QT/QTcB 354/433 ms  Patient scheduled for DCCV today. Follow up in the AF clinic as scheduled.

## 2022-07-28 NOTE — Anesthesia Preprocedure Evaluation (Addendum)
Anesthesia Evaluation  Patient identified by MRN, date of birth, ID band Patient awake    Reviewed: Allergy & Precautions, NPO status , Patient's Chart, lab work & pertinent test results  History of Anesthesia Complications Negative for: history of anesthetic complications  Airway Mallampati: II  TM Distance: >3 FB Neck ROM: Full    Dental  (+) Poor Dentition, Missing, Dental Advisory Given, Chipped   Pulmonary neg pulmonary ROS   breath sounds clear to auscultation       Cardiovascular hypertension, Pt. on medications (-) angina + CAD and + Cardiac Stents  + Valvular Problems/Murmurs AS  Rhythm:Irregular Rate:Normal  02/2022 ECHO: EF 70-75%. LV has hyperdynamic function, no regional wall motion abnormalities, moderate LVH of the basal-septal segment. RVF is normal, normal pulmonary artery systolic pressure. Left atrial size was moderately dilated.  The mitral valve is abnormal. Mild mitral valve regurgitation.  The aortic valve is tricuspid. Aortic valve regurgitation is moderate. Aortic valve sclerosis is present, with no evidence of aortic valve stenosis.     Neuro/Psych  Headaches  Anxiety Depression Bipolar Disorder      GI/Hepatic negative GI ROS, Neg liver ROS,,,  Endo/Other  negative endocrine ROS    Renal/GU negative Renal ROS     Musculoskeletal  (+) Arthritis ,    Abdominal   Peds  Hematology  (+) Blood dyscrasia, anemia Xarelto   Anesthesia Other Findings   Reproductive/Obstetrics                              Anesthesia Physical Anesthesia Plan  ASA: 3  Anesthesia Plan: General   Post-op Pain Management: Minimal or no pain anticipated   Induction: Intravenous  PONV Risk Score and Plan: 2 and Treatment may vary due to age or medical condition  Airway Management Planned: Natural Airway and Mask  Additional Equipment: None  Intra-op Plan:   Post-operative Plan:    Informed Consent: I have reviewed the patients History and Physical, chart, labs and discussed the procedure including the risks, benefits and alternatives for the proposed anesthesia with the patient or authorized representative who has indicated his/her understanding and acceptance.     Dental advisory given  Plan Discussed with: CRNA and Surgeon  Anesthesia Plan Comments:          Anesthesia Quick Evaluation

## 2022-07-28 NOTE — Transfer of Care (Signed)
Immediate Anesthesia Transfer of Care Note  Patient: Justin [name]  Procedure(s) Performed: CARDIOVERSION  Patient Location: Endoscopy Unit  Anesthesia Type:General  Level of Consciousness: awake, drowsy, and patient cooperative  Airway & Oxygen Therapy: Patient Spontanous Breathing  Post-op Assessment: Report given to RN, Post -op Vital signs reviewed and stable, and Patient moving all extremities X 4  Post vital signs: Reviewed and stable  Last Vitals:  Vitals Value Taken Time  BP    Temp    Pulse    Resp    SpO2      Last Pain:  Vitals:   07/28/22 1115  TempSrc: Temporal  PainSc: 3          Complications: No notable events documented.

## 2022-07-28 NOTE — CV Procedure (Signed)
    CARDIOVERSION NOTE  Procedure: Electrical Cardioversion Indications:  Atrial Fibrillation  Procedure Details:  Consent: Risks of procedure as well as the alternatives and risks of each were explained to the (patient/caregiver).  Consent for procedure obtained.  Time Out: Verified patient identification, verified procedure, site/side was marked, verified correct patient position, special equipment/implants available, medications/allergies/relevent history reviewed, required imaging and test results available.  Performed  Patient placed on cardiac monitor, pulse oximetry, supplemental oxygen as necessary.  Sedation given:  propofol per anesthesia Pacer pads placed anterior and posterior chest.  Cardioverted 1 time(s).  Cardioverted at 200J biphasic.  Impression: Findings: Post procedure EKG shows: NSR Complications: None Patient did tolerate procedure well.  Plan: Successful DCCV with a single 200J biphasic shock  Time Spent Directly with the Patient:  30 minutes   [name], MD, [facility], [place] Director of the [facility]  Card[facility] Diplomate of the American Board of Clinical Lipidology Attending Cardiologist  Direct Dial: [phone]  Fax: [phone]  Website:  [email or web address] 07/28/2022, 12:33 PM

## 2022-07-28 NOTE — Anesthesia Postprocedure Evaluation (Signed)
Anesthesia Post Note  Patient: Justin [name]  Procedure(s) Performed: CARDIOVERSION     Patient location during evaluation: Endoscopy Anesthesia Type: General Level of consciousness: awake and alert, patient cooperative and oriented Pain management: pain level controlled Vital Signs Assessment: post-procedure vital signs reviewed and stable Respiratory status: nonlabored ventilation, spontaneous breathing and respiratory function stable Cardiovascular status: blood pressure returned to baseline and stable Postop Assessment: no apparent nausea or vomiting and able to ambulate Anesthetic complications: no   No notable events documented.  Last Vitals:  Vitals:   07/28/22 1250 07/28/22 1255  BP: 102/81 125/79  Pulse: 70 69  Resp: 11 12  Temp:    SpO2: 98% 97%    Last Pain:  Vitals:   07/28/22 1255  TempSrc:   PainSc: 0-No pain                 Kamari Buch,E. Katye Valek

## 2022-07-28 NOTE — Discharge Instructions (Signed)

## 2022-07-28 NOTE — Interval H&P Note (Signed)
History and Physical Interval Note:  07/28/2022 12:06 PM  Justin [name]  has presented today for surgery, with the diagnosis of AFIB.  The various methods of treatment have been discussed with the patient and family. After consideration of risks, benefits and other options for treatment, the patient has consented to  Procedure(s): CARDIOVERSION (N/A) as a surgical intervention.  The patient's history has been reviewed, patient examined, no change in status, stable for surgery.  I have reviewed the patient's chart and labs.  Questions were answered to the patient's satisfaction.     [name]

## 2022-07-31 ENCOUNTER — Encounter (HOSPITAL_COMMUNITY): Payer: Self-pay | Admitting: Internal Medicine

## 2022-08-03 ENCOUNTER — Ambulatory Visit (HOSPITAL_COMMUNITY)
Admission: RE | Admit: 2022-08-03 | Discharge: 2022-08-03 | Disposition: A | Discharge status: Home / Self Care | Payer: Medicare Other | Source: Ambulatory Visit | Attending: Physician Assistant | Admitting: Physician Assistant

## 2022-08-03 ENCOUNTER — Encounter (HOSPITAL_COMMUNITY): Payer: Self-pay | Admitting: Physician Assistant

## 2022-08-03 ENCOUNTER — Ambulatory Visit (HOSPITAL_COMMUNITY): Payer: Medicare Other | Admitting: Licensed Clinical Social Worker

## 2022-08-03 VITALS — BP 146/86 | HR 86 | Ht 70.0 in | Wt 177.8 lb

## 2022-08-03 DIAGNOSIS — Z7901 Long term (current) use of anticoagulants: Secondary | ICD-10-CM | POA: Diagnosis not present

## 2022-08-03 DIAGNOSIS — I4819 Other persistent atrial fibrillation: Secondary | ICD-10-CM | POA: Insufficient documentation

## 2022-08-03 DIAGNOSIS — D6869 Other thrombophilia: Secondary | ICD-10-CM | POA: Diagnosis not present

## 2022-08-03 DIAGNOSIS — I10 Essential (primary) hypertension: Secondary | ICD-10-CM | POA: Insufficient documentation

## 2022-08-03 DIAGNOSIS — E785 Hyperlipidemia, unspecified: Secondary | ICD-10-CM | POA: Diagnosis not present

## 2022-08-03 DIAGNOSIS — I2510 Atherosclerotic heart disease of native coronary artery without angina pectoris: Secondary | ICD-10-CM | POA: Diagnosis not present

## 2022-08-03 NOTE — Progress Notes (Signed)
Primary Care Physician: Biagio Borg, MD Primary Cardiologist: Dr Gasper Sells Primary Electrophysiologist: Dr Quentin Ore Referring Physician: Dr Ollen Gross is a 76 y.o. male with a history of CAD, HTN, HLD, atrial fibrillation who presents for follow up in the [facility].  The patient was initially diagnosed with atrial fibrillation 05/06/21 at Dr Oralia Rud office. He wore a cardiac monitor which showed 100% rate controlled afib. He does feel palpitations and "swimmy headed" while in afib. He also has more SOB and fatigue. Patient is on Eliquis for a CHADS2VASC score of 4. Patient underwent ankle surgery on 06/08/21. He resumed anticoagulation 06/10/21.   Patient is s/p DCCV on 08/05/21 with quick return of afib. He saw Dr Quentin Ore and had an afib ablation 05/17/22. He was found to have persistent afib following ablation (Zio showed 100% afib burden) and he underwent DCCV on 07/28/22.  On follow up today, patient remains in [place] today. He has had very brief symptoms of palpitations and dizziness since then but it resolves quickly. No bleeding issues on anticoagulation.   Today, he denies symptoms of palpitations, chest pain, orthopnea, PND, lower extremity edema, dizziness, presyncope, syncope, snoring, daytime somnolence, bleeding, or neurologic sequela. The patient is tolerating medications without difficulties and is otherwise without complaint today.    Atrial Fibrillation Risk Factors:  he does not have symptoms or diagnosis of sleep apnea. he does not have a history of rheumatic fever. he does not have a history of alcohol use.   he has a BMI of Body mass index is 25.51 kg/m.Marland Kitchen Filed Weights   08/03/22 1043  Weight: 80.6 kg     Family History  Problem Relation Age of Onset   Arthritis Mother    Heart disease Mother        s/p CABG   Hypertension Mother    Alzheimer's disease Mother    Heart attack Mother    Arthritis  Father    Hyperlipidemia Father    Heart disease Father        s/p CABG   Stroke Father    Hypertension Father    Heart attack Father    Multiple sclerosis Sister    Diabetes Sister    Lung cancer Paternal Grandmother    Diabetes Sister    Heart attack Sister    Pulmonary embolism Sister        died   Lung cancer Maternal Aunt    Lung cancer Paternal Aunt    Depression Brother        suicide     Atrial Fibrillation Management history:  Previous antiarrhythmic drugs: none Previous cardioversions: 08/05/21, 07/28/22 Previous ablations: 05/17/22 CHADS2VASC score: 4 Anticoagulation history: Eliquis   Past Medical History:  Diagnosis Date   Anxiety and depression    Aortic insufficiency 03/11/2022   Echocardiogram 02/2022: EF 70-75, no RWMA, moderate LVH, normal RVSF, normal PASP (RVSP 20), moderate LAE, mild MR, moderate AI, AV sclerosis without stenosis, no aortic root or ascending aorta dilation   Arthritis    knees, c-spine // gets lumbar ESI q 6 mos   Bipolar disorder ([place])    CAD (coronary artery disease)    [place] 08/2009 >> LHC ([facility]) - mLAD 70, Dx 65 >> PCI:  3x15 mm Endeavor DES to mLAD and 2.5x12 mm Endeavor DES to Dx // S/p NSTEMI >> LHC 8/12 ([facility]) - LM ok, LAD prox and mid 40; LAD stent ok, Dx stent ok, mRCA  17, mLCx 50 >> med Rx // ETT-Echo 2/17 ([facility]): Normal, EF 55-60 at rest   Chicken pox    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[place])    Grief    History of echocardiogram    Echo 3/19: Mild concentric LVH, EF 60-65, normal wall motion, grade 1 diastolic dysfunction, mild AI, mildly dilated aortic root (40 mm), MAC, mild LAE, atrial septal lipomatous hypertrophy // Echo 8/22: EF 55-60, no RWMA, mild LVH, GRII DD, normal RVSF, RVSP 40, mild LAE, trivial MR, mild-moderate AI, AV sclerosis without stenosis, mild dilation of aortic root (39 mm)   History of non-ST elevation myocardial infarction (NSTEMI)    History of nuclear stress test    Nuclear stress test 3/19:  EF 57, inferior/inferoseptal/inferolateral defect consistent with probable soft tissue attenuation (cannot exclude subendocardial scar), no ischemia, intermediate risk >> Echo 3/19 normal EF, normal wall motion   History of pneumonia 2011   History of prostatitis 1990s   Hx of adenomatous colonic polyps 06/22/2016   Hyperlipidemia    Hypertension    Migraines    prior history   PTSD (post-traumatic stress disorder)    Past Surgical History:  Procedure Laterality Date   ATRIAL FIBRILLATION ABLATION N/A 05/17/2022   Procedure: ATRIAL FIBRILLATION ABLATION;  Surgeon: Vickie Epley, MD;  Location: [facility];  Service: Cardiovascular;  Laterality: N/A;   CARDIOVERSION N/A 08/05/2021   Procedure: CARDIOVERSION;  Surgeon: Acie Fredrickson Wonda Cheng, MD;  Location: [facility] ENDOSCOPY;  Service: Cardiovascular;  Laterality: N/A;   CARDIOVERSION N/A 07/28/2022   Procedure: CARDIOVERSION;  Surgeon: [name], MD;  Location: [facility];  Service: Cardiovascular;  Laterality: N/A;   CERVICAL DISCECTOMY     COLONOSCOPY  03/2017   ELBOW SURGERY Left    ELBOW SURGERY Right    INGUINAL HERNIA REPAIR Bilateral    1996, 1997   KNEE ARTHROSCOPY Right    KNEE CARTILAGE SURGERY Left    NASAL SEPTUM SURGERY     STENT PLACEMENT VASCULAR ([place] HX)  09/02/2010   TONSILLECTOMY     TOTAL KNEE ARTHROPLASTY Left 08/31/2018   Procedure: TOTAL KNEE ARTHROPLASTY;  Surgeon: Dorna Leitz, MD;  Location: [facility];  Service: Orthopedics;  Laterality: Left;    Current Outpatient Medications  Medication Sig Dispense Refill   ALPRAZolam (XANAX) 1 MG tablet TAKE 1/2 TO 1 TABLET BY MOUTH ONCE DAILY (Patient taking differently: Take 0.5-1 mg by mouth 2 (two) times daily as needed for anxiety or sleep.) 30 tablet 2   amitriptyline (ELAVIL) 10 MG tablet Take 10 mg by mouth at bedtime.     diltiazem (CARDIZEM CD) 180 MG 24 hr capsule TAKE 1 CAPSULE BY MOUTH DAILY 30 capsule 11   DULoxetine (CYMBALTA) 30 MG capsule Take 1  capsule (30 mg total) by mouth daily. 30 capsule 1   isosorbide mononitrate (IMDUR) 30 MG 24 hr tablet TAKE 1 TABLET BY MOUTH DAILY 30 tablet 6   lamoTRIgine (LAMICTAL) 200 MG tablet Take 1 tablet (200 mg total) by mouth daily. For mood control 30 tablet 2   lamoTRIgine (LAMICTAL) 25 MG tablet Take one tablet (25 mg) every night at bedtime. 30 tablet 2   nitroGLYCERIN (NITROSTAT) 0.4 MG SL tablet Place 1 tablet (0.4 mg total) under the tongue every 5 (five) minutes as needed for chest pain. 25 tablet 3   rivaroxaban (XARELTO) 20 MG TABS tablet Take 1 tablet (20 mg total) by mouth daily. 30 tablet 11   rosuvastatin (CRESTOR) 20 MG tablet TAKE  1 TABLET BY MOUTH DAILY 30 tablet 6   No current facility-administered medications for this encounter.    Allergies  Allergen Reactions   Ambien [Zolpidem Tartrate] Other (See Comments)    Blackout, memory issues   Codeine     Causes Memory issues    Ativan [Lorazepam] Other (See Comments)    Made him feel completely out of it fell down   Seroquel [Quetiapine]     SEVERE NIGHTMARES, SLEEPWALK AND NIGHT DRIVE WITH NO RECOLLECTION UPON WAKENING    Social History   Socioeconomic History   Marital status: Widowed    Spouse name: Not on file   Number of children: 2   Years of education: 80   Highest education level: Not on file  Occupational History   Occupation: Retired  Tobacco Use   Smoking status: Never   Smokeless tobacco: Never   Tobacco comments:    Never smoke 06/14/22  Vaping Use   Vaping Use: Never used  Substance and Sexual Activity   Alcohol use: Not Currently    Alcohol/week: 6.0 standard drinks of alcohol    Types: 6 Cans of beer per week    Comment: 3 beers in a sitting   Drug use: No   Sexual activity: Not Currently  Other Topics Concern   Not on file  Social History Narrative   Retired, widowed in 2017    1 son / 1 daughter   2 caffeinated beverages daily no alcohol or tobacco   Fun: Work out in the yard.    Denies religious beliefs effecting healthcare.    Worked at [employer] for 30 years   Social Determinants of [employer]: Low Risk  (12/23/2021)   Overall Financial Resource Strain (CARDIA)    Difficulty of Paying Living Expenses: Not hard at all  Food Insecurity: No Food Insecurity (12/23/2021)   Hunger Vital Sign    Worried About Running Out of Food in the Last Year: Never true    [place] in the Last Year: Never true  Transportation Needs: No Transportation Needs (12/23/2021)   PRAPARE - Hydrologist (Medical): No    Lack of Transportation (Non-Medical): No  Physical Activity: Inactive (12/23/2021)   Exercise Vital Sign    Days of Exercise per Week: 0 days    Minutes of Exercise per Session: 0 min  Stress: Stress Concern Present (12/23/2021)   [place]    Feeling of Stress : To some extent  Social Connections: Socially Isolated (12/23/2021)   Social Connection and Isolation Panel [NHANES]    Frequency of Communication with Friends and Family: More than three times a week    Frequency of Social Gatherings with Friends and Family: Once a week    Attends Religious Services: Never    Marine scientist or Organizations: No    Attends Archivist Meetings: Never    Marital Status: Widowed  Intimate Partner Violence: Not At Risk (12/23/2021)   Humiliation, Afraid, Rape, and Kick questionnaire    Fear of Current or Ex-Partner: No    Emotionally Abused: No    Physically Abused: No    Sexually Abused: No     ROS- All systems are reviewed and negative except as per the HPI above.  Physical Exam: Vitals:   08/03/22 1043  BP: (!) 146/86  Pulse: 86  Weight: 80.6 kg  Height: _0  (1.778 m)  GEN- The patient is a well appearing elderly male, alert and oriented x 3 today.   HEENT-head normocephalic, atraumatic, sclera clear, conjunctiva pink, hearing  intact, trachea midline. Lungs- Clear to ausculation bilaterally, normal work of breathing Heart- Regular rate and rhythm, no murmurs, rubs or gallops  GI- soft, NT, ND, + BS Extremities- no clubbing, cyanosis, or edema MS- no significant deformity or atrophy Skin- no rash or lesion Psych- euthymic mood, full affect Neuro- strength and sensation are intact   Wt Readings from Last 3 Encounters:  08/03/22 80.6 kg  07/28/22 79.4 kg  06/28/22 79.7 kg    EKG today demonstrates  SR, PACs Vent. rate 86 BPM PR interval 184 ms QRS duration 92 ms QT/QTcB 370/442 ms  Echo 03/23/21 demonstrated   1. Left ventricular ejection fraction, by estimation, is 55 to 60%. The left ventricle has normal function. The left ventricle has no regional wall motion abnormalities. There is mild left ventricular hypertrophy. Left ventricular diastolic parameters are consistent with Grade II diastolic dysfunction (pseudonormalization).   2. Right ventricular systolic function is normal. The right ventricular  size is mildly enlarged. There is mildly elevated pulmonary artery  systolic pressure. The estimated right ventricular systolic pressure is 96.2 mmHg.   3. Left atrial size was mildly dilated.   4. The mitral valve is normal in structure. Trivial mitral valve  regurgitation. No evidence of mitral stenosis.   5. The aortic valve is tricuspid. Aortic valve regurgitation is mild to  moderate. Mild aortic valve sclerosis is present, with no evidence of aortic valve stenosis.   6. Aortic dilatation noted. There is mild dilatation of the aortic root,  measuring 39 mm.   7. The inferior vena cava is dilated in size with <50% respiratory  variability, suggesting right atrial pressure of 15 mmHg.   Epic records are reviewed at length today  CHA2DS2-VASc Score = 4  The patient's score is based upon: CHF History: 0 HTN History: 1 Diabetes History: 0 Stroke History: 0 Vascular Disease History: 1 Age Score:  2 Gender Score: 0       ASSESSMENT AND PLAN: 1. Persistent Atrial Fibrillation (ICD10:  I48.19) The patient's CHA2DS2-VASc score is 4, indicating a 4.8% annual risk of stroke.   S/p afib ablation 05/17/22 S/p DCCV 07/28/22 Patient appears to be maintaining SR.   Continue diltiazem 180 mg daily Continue Eliquis 5 mg BID with no missed doses for 3 months post ablation.   2. Secondary Hypercoagulable State (ICD10:  D68.69) The patient is at significant risk for stroke/thromboembolism based upon his CHA2DS2-VASc Score of 4.  Continue Apixaban (Eliquis).   3. HTN Stable, no changes today.  4. CAD No anginal symptoms.   Follow up with Dr Quentin Ore as scheduled.    [facility] [address] [place],  [address] [phone] 08/03/2022 10:52 AM

## 2022-08-16 NOTE — Progress Notes (Signed)
Electrophysiology Office Note:    Date:  08/17/2022   ID:  Justin [name], DOB 1946/04/08, MRN [id number]  PCP:  Justin Borg, MD  [facility] Cardiologist:  Justin Mocha, MD  [facility] Electrophysiologist:  Justin Epley, MD   Referring MD: Justin Borg, MD   Chief Complaint: Follow-up s/p Ablation and DCCV  History of Present Illness:    Justin [name] is a 76 y.o. male who presents for a follow-up visit.   He saw Justin Peals, PA on 08/18/2021 and was s/p DCCV on 08/05/2021. He was found to be back in rate controlled atrial fibrillation, on diltiazem and Eliquis. Would avoid class IC with h/o CAD, can not use dofetilide with lamotrigine. He was not interested in repeat DCCV. After discussion of Multaq, amiodarone, and ablation, he was agreeable to a consultation with EP.  I saw him in clinic 02/03/2022 where he complained of intermittent palpitations associated with a quick, stabbing chest pain. Mild symptoms of fatigue and dyspnea. He preferred a strategy to avoid long-term exposure antiarrhythmic drug therapy if at all possible. On Eliquis for stroke prophylaxis. After a long discussion, the patient elected to proceed with catheter ablation of his atrial fibrillation.  He underwent successful PVI ablation 05/17/2022. The following day he presented to the ED with left groin swelling and pain. CT imaging was negative for any acute vascular complications. EKG revealed he was in sinus rhythm.  He saw Justin [name], [name] 06/14/2022 and his groin hematoma had resolved. However, he was in symptomatic A-fib at 91 bpm. He wore a Zio monitor which confirmed 100% atrial fibrillation burden. Subsequently underwent DCCV 07/28/2022 which was successful with a single 200J biphasic shock. At his follow-up with [name] 08/03/2022 he appeared to be maintaining sinus rhythm. His EKG was also notable for PACs.  Today, he thought he might be out of rhythm. Lately he has been feeling fatigued and  short of breath, with "skipping or quivering" heart beats.  He is compliant with his Xarelto. He confirms that Eliquis had been switched to Xarelto as he had often missed his second dose.  He denies any chest pain, or peripheral edema. No lightheadedness, headaches, syncope, orthopnea, or PND.     Past Medical History:  Diagnosis Date   Anxiety and depression    Aortic insufficiency 03/11/2022   Echocardiogram 02/2022: EF 70-75, no RWMA, moderate LVH, normal RVSF, normal PASP (RVSP 20), moderate LAE, mild MR, moderate AI, AV sclerosis without stenosis, no aortic root or ascending aorta dilation   Arthritis    knees, c-spine // gets lumbar ESI q 6 mos   Bipolar disorder (HCC)    CAD (coronary artery disease)    [place] 08/2009 >> LHC ([facility]) - mLAD 70, Dx 65 >> PCI:  3x15 mm Endeavor DES to mLAD and 2.5x12 mm Endeavor DES to Dx // S/p NSTEMI >> LHC 8/12 ([facility]) - LM ok, LAD prox and mid 40; LAD stent ok, Dx stent ok, mRCA 30, mLCx 50 >> med Rx // ETT-Echo 2/17 ([facility]): Normal, EF 55-60 at rest   Chicken pox    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[name])    Grief    History of echocardiogram    Echo 3/19: Mild concentric LVH, EF 60-65, normal wall motion, grade 1 diastolic dysfunction, mild AI, mildly dilated aortic root (40 mm), MAC, mild LAE, atrial septal lipomatous hypertrophy // Echo 8/22: EF 55-60, no RWMA, mild LVH, GRII DD, normal RVSF, RVSP 40, mild LAE, trivial  MR, mild-moderate AI, AV sclerosis without stenosis, mild dilation of aortic root (39 mm)   History of non-ST elevation myocardial infarction (NSTEMI)    History of nuclear stress test    Nuclear stress test 3/19: EF 57, inferior/inferoseptal/inferolateral defect consistent with probable soft tissue attenuation (cannot exclude subendocardial scar), no ischemia, intermediate risk >> Echo 3/19 normal EF, normal wall motion   History of pneumonia 2011   History of prostatitis 1990s   Hx of adenomatous colonic polyps 06/22/2016    Hyperlipidemia    Hypertension    Migraines    prior history   PTSD (post-traumatic stress disorder)     Past Surgical History:  Procedure Laterality Date   ATRIAL FIBRILLATION ABLATION N/A 05/17/2022   Procedure: ATRIAL FIBRILLATION ABLATION;  Surgeon: Justin Epley, MD;  Location: [place] [facility];  Service: Cardiovascular;  Laterality: N/A;   CARDIOVERSION N/A 08/05/2021   Procedure: CARDIOVERSION;  Surgeon: Justin Fredrickson Justin Cheng, MD;  Location: [facility] ENDOSCOPY;  Service: Cardiovascular;  Laterality: N/A;   CARDIOVERSION N/A 07/28/2022   Procedure: CARDIOVERSION;  Surgeon: Justin Casino, MD;  Location: [place];  Service: Cardiovascular;  Laterality: N/A;   CERVICAL DISCECTOMY     COLONOSCOPY  03/2017   ELBOW SURGERY Left    ELBOW SURGERY Right    INGUINAL HERNIA REPAIR Bilateral    1996, 1997   KNEE ARTHROSCOPY Right    KNEE CARTILAGE SURGERY Left    NASAL SEPTUM SURGERY     STENT PLACEMENT VASCULAR ([place] [facility])  09/02/2010   TONSILLECTOMY     TOTAL KNEE ARTHROPLASTY Left 08/31/2018   Procedure: TOTAL KNEE ARTHROPLASTY;  Surgeon: Justin Leitz, MD;  Location: [facility];  Service: Orthopedics;  Laterality: Left;    Current Medications: Current Meds  Medication Sig   ALPRAZolam (XANAX) 1 MG tablet TAKE 1/2 TO 1 TABLET BY MOUTH ONCE DAILY (Patient taking differently: Take 0.5-1 mg by mouth 2 (two) times daily as needed for anxiety or sleep.)   amiodarone (PACERONE) 200 MG tablet Take 1 tablet (200 mg total) by mouth daily. Take 2 tablets (400 mg total) twice daily for 5 days, then take 2 tablets (400 mg) once daily for 5 days, then take 1 tablet (200 mg total) once daily thereafter.   diltiazem (CARDIZEM CD) 180 MG 24 hr capsule TAKE 1 CAPSULE BY MOUTH DAILY   isosorbide mononitrate (IMDUR) 30 MG 24 hr tablet TAKE 1 TABLET BY MOUTH DAILY   lamoTRIgine (LAMICTAL) 200 MG tablet Take 1 tablet (200 mg total) by mouth daily. For mood control   lamoTRIgine (LAMICTAL) 25 MG tablet Take  one tablet (25 mg) every night at bedtime.   nitroGLYCERIN (NITROSTAT) 0.4 MG SL tablet Place 1 tablet (0.4 mg total) under the tongue every 5 (five) minutes as needed for chest pain.   rivaroxaban (XARELTO) 20 MG TABS tablet Take 1 tablet (20 mg total) by mouth daily.   rosuvastatin (CRESTOR) 20 MG tablet TAKE 1 TABLET BY MOUTH DAILY     Allergies:   Ambien [zolpidem tartrate], Codeine, Ativan [lorazepam], Amitriptyline, and Seroquel [quetiapine]   Social History   Socioeconomic History   Marital status: Widowed    Spouse name: Not on file   Number of children: 2   Years of education: 47   Highest education level: Not on file  Occupational History   Occupation: Retired  Tobacco Use   Smoking status: Never   Smokeless tobacco: Never   Tobacco comments:    Never smoke 06/14/22  Vaping Use   Vaping Use: Never used  Substance and Sexual Activity   Alcohol use: Not Currently    Alcohol/week: 6.0 standard drinks of alcohol    Types: 6 Cans of beer per week    Comment: 3 beers in a sitting   Drug use: No   Sexual activity: Not Currently  Other Topics Concern   Not on file  Social History Narrative   Retired, widowed in 2017    1 son / 1 daughter   2 caffeinated beverages daily no alcohol or tobacco   Fun: Work out in the yard.   Denies religious beliefs effecting healthcare.    Worked at [employer] for 30 years   Social Determinants of [employer]: Low Risk  (12/23/2021)   Overall Financial Resource Strain (CARDIA)    Difficulty of Paying Living Expenses: Not hard at all  Food Insecurity: No Food Insecurity (12/23/2021)   Hunger Vital Sign    Worried About Running Out of Food in the Last Year: Never true    [place] in the Last Year: Never true  Transportation Needs: No Transportation Needs (12/23/2021)   PRAPARE - Hydrologist (Medical): No    Lack of Transportation (Non-Medical): No  Physical Activity: Inactive  (12/23/2021)   Exercise Vital Sign    Days of Exercise per Week: 0 days    Minutes of Exercise per Session: 0 min  Stress: Stress Concern Present (12/23/2021)   [place]    Feeling of Stress : To some extent  Social Connections: Socially Isolated (12/23/2021)   Social Connection and Isolation Panel [NHANES]    Frequency of Communication with Friends and Family: More than three times a week    Frequency of Social Gatherings with Friends and Family: Once a week    Attends Religious Services: Never    Marine scientist or Organizations: No    Attends Archivist Meetings: Never    Marital Status: Widowed     Family History: The patient's family history includes Alzheimer's disease in his mother; Arthritis in his father and mother; Depression in his brother; Diabetes in his sister and sister; Heart attack in his father, mother, and sister; Heart disease in his father and mother; Hyperlipidemia in his father; Hypertension in his father and mother; Lung cancer in his maternal aunt, paternal aunt, and paternal grandmother; Multiple sclerosis in his sister; Pulmonary embolism in his sister; Stroke in his father.  ROS:   Please see the history of present illness.    (+) Palpitations (+) Fatigue (+) Short of breath All other systems reviewed and are negative.  EKGs/Labs/Other Studies Reviewed:    The following studies were reviewed today:  06/2022  Monitor: HR 43 - 168 bpm, average 81 bpm. Rare ventricular ectopy. 100% atrial fibrillation.  03/10/2022  Echo: 1. Left ventricular ejection fraction, by estimation, is 70 to 75%. The  left ventricle has hyperdynamic function. The left ventricle has no  regional wall motion abnormalities. There is moderate left ventricular  hypertrophy of the basal-septal segment.  Left ventricular diastolic function could not be evaluated.   2. Right ventricular systolic function is  normal. The right ventricular  size is normal. There is normal pulmonary artery systolic pressure. The  estimated right ventricular systolic pressure is 54.6 mmHg.   3. Left atrial size was moderately dilated.   4. The mitral valve is abnormal.  Mild mitral valve regurgitation.   5. The aortic valve is tricuspid. Aortic valve regurgitation is moderate.  Aortic valve sclerosis is present, with no evidence of aortic valve  stenosis.   6. The inferior vena cava is normal in size with greater than 50%  respiratory variability, suggesting right atrial pressure of 3 mmHg.   Comparison(s): Changes from prior study are noted. 03/23/2021: LVEF 55-60%,  grade 2 DD, mild to moderate AI.    EKG:   EKG is personally reviewed.  08/17/2022: Atrial fibrillation with an incomplete right bundle branch block and a left anterior fascicular block.  Ventricular rate of 89 bpm.  QTc is 428 ms.   Recent Labs: 10/12/2021: ALT 19; TSH 0.63 05/18/2022: Magnesium 2.3 07/28/2022: BUN 12; Creatinine, Ser 0.94; Hemoglobin 14.0; Platelets 196; Potassium 4.6; Sodium 136   Recent Lipid Panel    Component Value Date/Time   CHOL 137 10/12/2021 1527   TRIG 44.0 10/12/2021 1527   HDL 77.80 10/12/2021 1527   CHOLHDL 2 10/12/2021 1527   VLDL 8.8 10/12/2021 1527   LDLCALC 50 10/12/2021 1527    Physical Exam:    VS:  BP 130/76   Pulse 89   Ht _0  (1.778 m)   Wt 178 lb (80.7 kg)   SpO2 99%   BMI 25.54 kg/m     Wt Readings from Last 3 Encounters:  08/17/22 178 lb (80.7 kg)  08/03/22 177 lb 12.8 oz (80.6 kg)  07/28/22 175 lb (79.4 kg)     GEN: Well nourished, well developed in no acute distress HEENT: Normal NECK: No JVD; No carotid bruits LYMPHATICS: No lymphadenopathy CARDIAC: RRR, no murmurs, rubs, gallops RESPIRATORY:  Clear to auscultation without rales, wheezing or rhonchi  ABDOMEN: Soft, non-tender, non-distended MUSCULOSKELETAL:  No edema; No deformity  SKIN: Warm and dry NEUROLOGIC:  Alert and  oriented x 3 PSYCHIATRIC:  Normal affect       ASSESSMENT:    1. Persistent atrial fibrillation ([place])   2. Primary hypertension     PLAN:    In order of problems listed above:   #Persistent atrial fibrillation Mild symptoms of fatigue and dyspnea.  I discussed treatment options available for his atrial fibrillation including a conservative management strategy, rhythm control using antiarrhythmic drugs and rhythm control using a catheter ablation.    To give him some immediate relief, I would like to start with amiodarone.  He is not a candidate for dofetilide given his use of Lamictal.  Will start with amiodarone 400 mg by mouth twice daily for 5 days followed by 400 mg by mouth once daily for 5 days followed by 200 mg by mouth once daily thereafter.  Will plan for cardioversion in 4 weeks and follow-up in our clinic in 3 months.  This will help give him some immediate relief from the symptoms associated with his atrial fibrillation and will also help confirm that he is able to maintain sinus rhythm.  If sinus rhythm is maintained and he feels better, can consider adjunctive ablation in an effort to avoid long-term exposure to amiodarone.  This will be decided at the 106-monthfollow-up appointment.  I discussed the amiodarone in detail during today's visit including the risks and need for ongoing monitoring.        Medication Adjustments/Labs and Tests Ordered: Current medicines are reviewed at length with the patient today.  Concerns regarding medicines are outlined above.   Orders Placed This Encounter  Procedures   Basic metabolic panel  CBC with Differential/Platelet   EKG 12-Lead   Meds ordered this encounter  Medications   amiodarone (PACERONE) 200 MG tablet    Sig: Take 1 tablet (200 mg total) by mouth daily. Take 2 tablets (400 mg total) twice daily for 5 days, then take 2 tablets (400 mg) once daily for 5 days, then take 1 tablet (200 mg total) once daily  thereafter.    Dispense:  120 tablet    Refill:  3   I,Mathew Stumpf,acting as a scribe for Justin Epley, MD.,have documented all relevant documentation on the behalf of Justin Epley, MD,as directed by  Justin Epley, MD while in the presence of Justin Epley, MD.  I, Justin Epley, MD, have reviewed all documentation for this visit. The documentation on 08/17/22 for the exam, diagnosis, procedures, and orders are all accurate and complete.  Signed, Hilton Cork. Quentin Ore, MD, [facility], [facility] 08/17/2022 7:19 PM    Electrophysiology [facility]

## 2022-08-17 ENCOUNTER — Encounter: Payer: Self-pay | Admitting: Cardiology

## 2022-08-17 ENCOUNTER — Ambulatory Visit: Payer: Medicare Other | Attending: Cardiology | Admitting: Cardiology

## 2022-08-17 ENCOUNTER — Encounter: Payer: Self-pay | Admitting: Internal Medicine

## 2022-08-17 VITALS — BP 130/76 | HR 89 | Ht 70.0 in | Wt 178.0 lb

## 2022-08-17 DIAGNOSIS — I4819 Other persistent atrial fibrillation: Secondary | ICD-10-CM | POA: Diagnosis not present

## 2022-08-17 DIAGNOSIS — I10 Essential (primary) hypertension: Secondary | ICD-10-CM

## 2022-08-17 MED ORDER — AMIODARONE HCL 200 MG PO TABS: 200.0000 mg | ORAL_TABLET | Freq: Every day | ORAL | 3 refills | Status: DC

## 2022-08-17 NOTE — Patient Instructions (Signed)
Medication Instructions:  Your physician has recommended you make the following change in your medication:  1) START taking amiodarone 400 mg twice daily for 5 days, then 400 mg once daily for 5 days, then 200 mg once daily thereafter  *If you need a refill on your cardiac medications before your next appointment, please call your pharmacy*  Labs: BMET and CBC prior to cardioversion  Testing/Procedures: Your physician has recommended that you have a Cardioversion (DCCV). Electrical Cardioversion uses a jolt of electricity to your heart either through paddles or wired patches attached to your chest. This is a controlled, usually prescheduled, procedure. Defibrillation is done under light anesthesia in the hospital, and you usually go home the day of the procedure. This is done to get your heart back into a normal rhythm. You are not awake for the procedure. Please see the instruction sheet given to you today.  Follow-Up: At [facility], you and your health needs are our priority.  As part of our continuing mission to provide you with exceptional heart care, we have created designated Provider Care Teams.  These Care Teams include your primary Cardiologist (physician) and Advanced Practice Providers (APPs -  Physician Assistants and Nurse Practitioners) who all work together to provide you with the care you need, when you need it.  Your next appointment:   3 month(s)  The format for your next appointment:   In Person  Provider:   You may see Vickie Epley, MD or one of the following Advanced Practice Providers on your designated Care Team:   Tommye Standard, Orlinda Blalock "[facility]" Chalmers Cater, [place]    Other Instructions   Dear [name]  You are scheduled for a Cardioversion on Thursday, January 25 with Dr. Marlou Porch.  Please arrive at the [facility] (Main Entrance A) at [facility]: [address] [place], [place] [address] at 8:00 AM.   DIET:  Nothing to eat or drink  after midnight except a sip of water with medications (see medication instructions below)  MEDICATION INSTRUCTIONS: Continue taking your anticoagulant (blood thinner): Rivaroxaban (Xarelto).  You will need to continue this after your procedure until you are told by your provider that it is safe to stop.    LABS:  On 09/12/21 - Come to the lab at [facility] at [address] between the hours of 8:00 am and 4:30 pm. You do NOT have to be fasting.  FYI:  For your safety, and to allow [place] to monitor your vital signs accurately during the surgery/procedure we request: If you have artificial nails, gel coating, SNS etc, please have those removed prior to your surgery/procedure. Not having the nail coverings /polish removed may result in cancellation or delay of your surgery/procedure.  You must have a responsible person to drive you home and stay in the waiting area during your procedure. Failure to do so could result in cancellation.  Bring your insurance cards.  *Special Note: Every effort is made to have your procedure done on time. Occasionally there are emergencies that occur at the hospital that may cause delays. Please be patient if a delay does occur.      Important Information About [place]

## 2022-08-18 ENCOUNTER — Other Ambulatory Visit: Payer: Self-pay

## 2022-08-18 DIAGNOSIS — I4819 Other persistent atrial fibrillation: Secondary | ICD-10-CM

## 2022-08-23 ENCOUNTER — Telehealth: Payer: Self-pay | Admitting: Cardiology

## 2022-08-23 NOTE — Telephone Encounter (Signed)
Pt reports sob, weakness, near syncope, nausea and stomach pain since starting Amiodarone last week. He was walking his dog over weekend and had to stop d/t feeling "swimmy headed".  Discussed w/ DOD, Dr. Lovena Le. Pt advised to decrease Amiodarone to 1 tablet BID for a week, then decrease to 1 tab daily thereafter. Advised to take with food over the next week as well. Advised to call office back if no improvement in symptoms after this decrease. Patient verbalized understanding and agreeable to plan.

## 2022-08-23 NOTE — Telephone Encounter (Signed)
    Pt c/o medication issue:  1. Name of Medication: amiodarone (PACERONE) 200 MG tablet   2. How are you currently taking this medication (dosage and times per day)? TAKE 1/2 TO 1 TABLET BY MOUTH ONCE DAILYPatient taking differently: Take 0.5-1 mg by mouth 2 (two) times daily as needed for anxiety or sleep.   3. Are you having a reaction (difficulty breathing--STAT)? No   4. What is your medication issue? Pt said, he gets SOB and feeling dizzy when he started taking this medication. He stopped taking it this weekend

## 2022-08-30 ENCOUNTER — Ambulatory Visit (HOSPITAL_COMMUNITY): Payer: Medicare Other | Admitting: Licensed Clinical Social Worker

## 2022-09-05 ENCOUNTER — Telehealth (HOSPITAL_COMMUNITY): Payer: Medicare Other | Admitting: Psychiatry

## 2022-09-05 DIAGNOSIS — G609 Hereditary and idiopathic neuropathy, unspecified: Secondary | ICD-10-CM | POA: Insufficient documentation

## 2022-09-08 ENCOUNTER — Encounter (HOSPITAL_COMMUNITY): Payer: Self-pay | Admitting: Psychiatry

## 2022-09-08 ENCOUNTER — Telehealth (HOSPITAL_BASED_OUTPATIENT_CLINIC_OR_DEPARTMENT_OTHER): Payer: Medicare Other | Admitting: Psychiatry

## 2022-09-08 DIAGNOSIS — F4310 Post-traumatic stress disorder, unspecified: Secondary | ICD-10-CM

## 2022-09-08 DIAGNOSIS — F3162 Bipolar disorder, current episode mixed, moderate: Secondary | ICD-10-CM | POA: Diagnosis not present

## 2022-09-08 DIAGNOSIS — F419 Anxiety disorder, unspecified: Secondary | ICD-10-CM | POA: Diagnosis not present

## 2022-09-08 MED ORDER — DULOXETINE HCL 30 MG PO CPEP: 30.0000 mg | ORAL_CAPSULE | Freq: Every day | ORAL | 2 refills | Status: DC

## 2022-09-08 MED ORDER — LAMOTRIGINE 200 MG PO TABS: 200.0000 mg | ORAL_TABLET | Freq: Every day | ORAL | 2 refills | Status: DC

## 2022-09-08 NOTE — Progress Notes (Signed)
Virtual Visit via Telephone Note  I connected with Justin [name] on 09/08/22 at  2:40 PM EST by telephone and verified that I am speaking with the correct person using two identifiers.  Location: Patient: Home Provider: Office   I discussed the limitations, risks, security and privacy concerns of performing an evaluation and management service by telephone and the availability of in person appointments. I also discussed with the patient that there may be a patient responsible charge related to this service. The patient expressed understanding and agreed to proceed.   History of Present Illness: Patient is evaluated by phone session.  He admitted not taking the Cymbalta and Lamictal because he was not sure if Lamictal causing A-fib.  He recently had a visit with a cardiologist and medicines were adjusted.  He thought that PA told something about the Lamictal and A-fib and he decided to stop it taking the Lamictal.  He also did not get a refill from Cymbalta because he told the pharmacy never called to pick up the medication.  I reviewed the chart.  I did not see any recommendations from cardiology to stop the Lamictal and Cymbalta was given in November with additional refill.  It is unclear why the pharmacy did not send a refill.  He admitted feeling more sad, depressed, isolated and withdrawn.  He is frustrated with his A-fib because he may need a cardiac version if his rhythm do not get better.  He was given the medication but is still having issues with his heart.  He reported the Lamictal and Cymbalta actually helping him and since not taking it he feels more sad depressed.  He missed the last appointment with therapist because of heavy rain.  He has nightmares and flashback and sometime he sleeps very little.  He denies any suicidal thoughts or homicidal thoughts.  Denies any paranoia or any hallucination.  He denies any mania or impulsive behavior.  He is hoping to had a better result on has  cardiac rhythm so he can avoid cardioversion.    Past Psychiatric History:  H/O suicidal attempt by overdose on Ambien twice, hydroxyzine and alcohol. H/O bipolar disorder with multiple inpatient.  Did IOP. Last ER visit Sept 2020 and inpatient October 2018 at [facility]. Tried Zyprexa, Cymbalta, Seroquel, Vistaril, Neurontin, Wellbutrin and triptal. Tried low-dose doxepin and trazodone. Had a reaction with Ativan and pain medication.  Saw Dr. Letta Moynahan in past.  Recent Results (from the past 2160 hour(s))  CBC     Status: Abnormal   Collection Time: 07/28/22 10:42 AM  Result Value Ref Range   WBC 4.8 4.0 - 10.5 K/uL   RBC 4.23 4.22 - 5.81 MIL/uL   Hemoglobin 14.0 13.0 - 17.0 g/dL   HCT 44.2 39.0 - 52.0 %   MCV 104.5 (H) 80.0 - 100.0 fL   MCH 33.1 26.0 - 34.0 pg   MCHC 31.7 30.0 - 36.0 g/dL   RDW 12.1 11.5 - 15.5 %   Platelets 196 150 - 400 K/uL   nRBC 0.0 0.0 - 0.2 %    Comment: Performed at [facility], [place] [address] [place], [place] [address]  Basic metabolic panel     Status: Abnormal   Collection Time: 07/28/22 10:42 AM  Result Value Ref Range   Sodium 136 135 - 145 mmol/L   Potassium 4.6 3.5 - 5.1 mmol/L   Chloride 108 98 - 111 mmol/L   CO2 21 (L) 22 - 32 mmol/L  Glucose, Bld 103 (H) 70 - 99 mg/dL    Comment: Glucose reference range applies only to samples taken after fasting for at least 8 hours.   BUN 12 8 - 23 mg/dL   Creatinine, Ser 0.94 0.61 - 1.24 mg/dL   Calcium 9.2 8.9 - 10.3 mg/dL   GFR, Estimated >60 >60 mL/min    Comment: (NOTE) Calculated using the CKD-EPI Creatinine Equation (2021)    Anion gap 7 5 - 15    Comment: Performed at [facility] [address], [place], [place] [address]     Psychiatric Specialty Exam: Physical Exam  Review of Systems  Weight 178 lb (80.7 kg).There is no height or weight on file to calculate BMI.  General Appearance: NA  Eye Contact:  NA  Speech:  Slow  Volume:  Decreased  Mood:  Anxious and Dysphoric   Affect:  NA  Thought Process:  Descriptions of Associations: Intact  Orientation:  Full (Time, Place, and Person)  Thought Content:  Rumination  Suicidal Thoughts:  No  Homicidal Thoughts:  No  Memory:  Immediate;   Fair Recent;   Fair Remote;   Fair  Judgement:  Fair  Insight:  Shallow  Psychomotor Activity:  NA  Concentration:  Concentration: Fair and Attention Span: Fair  Recall:  [employer] of Knowledge:  Fair  Language:  Good  Akathisia:  No  Handed:  Right  AIMS (if indicated):     Assets:  Communication Skills Desire for Improvement Housing Transportation  ADL's:  Intact  Cognition:  WNL  Sleep:         Assessment and Plan: PTSD.  Bipolar disorder type I.  Anxiety.  It is unclear why he stopped the Lamictal.  He feels there is a confusion and he like to go back on Lamictal since it was working.  I will send another prescription of Cymbalta and Lamictal to the pharmacy.  He is also without the Cymbalta because he believed pharmacy did not give the refills.  He is hoping to get a visit with a cardiologist to determine if he need cardioversion.  I reviewed blood work results.  His labs are stable.  I encouraged to continue therapy with Janett Billow.  It is possible worsening of symptoms due to noncompliance with medication.  I strongly encourage that discussed with the cardiologist if there is contraindication with the Lamictal then he need to call us immediately.  I will also forward my note to his cardiologist.  Recommend to call us back with any question or any concern.  Follow-up in 3 months.  Discussed safety concerns and any time having active suicidal thoughts or homicidal thought any need to call 911 or go to local emergency room.  Follow Up Instructions:    I discussed the assessment and treatment plan with the patient. The patient was provided an opportunity to ask questions and all were answered. The patient agreed with the plan and demonstrated an understanding of  the instructions.   The patient was advised to call back or seek an in-person evaluation if the symptoms worsen or if the condition fails to improve as anticipated.  Collaboration of Care: Other provider involved in patient's care AEB notes are available in epic to review.  Patient/Guardian was advised Release of Information must be obtained prior to any record release in order to collaborate their care with an outside provider. Patient/Guardian was advised if they have not already done so to contact the registration department to  sign all necessary forms in order for [place] to release information regarding their care.   Consent: Patient/Guardian gives verbal consent for treatment and assignment of benefits for services provided during this visit. Patient/Guardian expressed understanding and agreed to proceed.    I provided 25 minutes of non-face-to-face time during this encounter.   Kathlee Nations, MD

## 2022-09-12 ENCOUNTER — Other Ambulatory Visit: Payer: Medicare Other

## 2022-09-12 ENCOUNTER — Telehealth: Payer: Self-pay | Admitting: Cardiology

## 2022-09-12 NOTE — Telephone Encounter (Signed)
Spoke with the patient and rescheduled his cardioversion for 2/2 at 10:00am. Patient is aware to arrive at 9:00am. Lab work has also been rescheduled.

## 2022-09-12 NOTE — Telephone Encounter (Signed)
Pt would like a callback in regards to rescheduling procedure that is scheduled for 09/15/22. Please advise

## 2022-09-13 ENCOUNTER — Ambulatory Visit (INDEPENDENT_AMBULATORY_CARE_PROVIDER_SITE_OTHER): Payer: Medicare Other | Admitting: Licensed Clinical Social Worker

## 2022-09-13 ENCOUNTER — Encounter (HOSPITAL_COMMUNITY): Payer: Self-pay | Admitting: Licensed Clinical Social Worker

## 2022-09-13 ENCOUNTER — Encounter (HOSPITAL_COMMUNITY): Payer: Self-pay

## 2022-09-13 DIAGNOSIS — F3162 Bipolar disorder, current episode mixed, moderate: Secondary | ICD-10-CM

## 2022-09-13 NOTE — Progress Notes (Signed)
Virtual Visit via Video Note  I connected with Justin [name] on 09/13/22 at  1:30 PM EST by a video enabled telemedicine application and verified that I am speaking with the correct person using two identifiers.  Location: Patient: home Provider: office   I discussed the limitations of evaluation and management by telemedicine and the availability of in person appointments. The patient expressed understanding and agreed to proceed.    I discussed the assessment and treatment plan with the patient. The patient was provided an opportunity to ask questions and all were answered. The patient agreed with the plan and demonstrated an understanding of the instructions.   The patient was advised to call back or seek an in-person evaluation if the symptoms worsen or if the condition fails to improve as anticipated.  I provided 45 minutes of non-face-to-face time during this encounter.   [name], LCSW   THERAPIST PROGRESS NOTE  Session Time: 1:30pm-2:15pm  Participation Level: Active  Behavioral Response: Well GroomedAlertDepressed  Type of Therapy: Individual Therapy  Treatment Goals addressed: LTG: Reduce frequency, intensity, and duration of depression symptoms so that daily functioning is improved   ProgressTowards Goals: Progressing  Interventions: CBT  Summary: Justin [name] is a 77 y.o. male who presents with Bipolar I Disorder, mixed, moderate  Suicidal/Homicidal: Nowithout intent/plan  Therapist Response: Justin [name] engaged well in individual session with clinician. Clinician utilized CBT to process thoughts, feelings, and behaviors. Justin [name] shared concerns and frustrations about his ongoing willingness to go out of his way for his family with no return of the same energy. Clinician utilized CBT reality testing about the continuing nature of this reality, as well as the expectations that Justin [name] has for his children to be more involved, supportive, and inclusive of him.  Clinician challenged those thoughts and explored ways to have more appropriate expectations of his children, as well as ways to think about his own needs. Clinician identified the importance of caring enough for himself to not allow others to mistreat him. Discussed boundaries.   Plan: Return again in 2 weeks.  Diagnosis: Bipolar 1 disorder, mixed, moderate ([place])  Collaboration of Care: Medication Management AEB provided update to Dr. Adele Schilder  Patient/Guardian was advised Release of Information must be obtained prior to any record release in order to collaborate their care with an outside provider. Patient/Guardian was advised if they have not already done so to contact the registration department to sign all necessary forms in order for [place] to release information regarding their care.   Consent: Patient/Guardian gives verbal consent for treatment and assignment of benefits for services provided during this visit. Patient/Guardian expressed understanding and agreed to proceed.   Justin Marker Star, LCSW 09/13/2022

## 2022-09-15 DIAGNOSIS — Z01818 Encounter for other preprocedural examination: Secondary | ICD-10-CM

## 2022-09-15 DIAGNOSIS — I4819 Other persistent atrial fibrillation: Secondary | ICD-10-CM

## 2022-09-19 ENCOUNTER — Telehealth: Payer: Self-pay | Admitting: Cardiology

## 2022-09-19 ENCOUNTER — Ambulatory Visit: Payer: Medicare Other | Attending: Cardiology

## 2022-09-19 DIAGNOSIS — I4819 Other persistent atrial fibrillation: Secondary | ICD-10-CM

## 2022-09-19 DIAGNOSIS — I10 Essential (primary) hypertension: Secondary | ICD-10-CM

## 2022-09-19 LAB — BASIC METABOLIC PANEL
BUN/Creatinine Ratio: 17 (ref 10–24)
BUN: 15 mg/dL (ref 8–27)
CO2: 26 mmol/L (ref 20–29)
Calcium: 9 mg/dL (ref 8.6–10.2)
Chloride: 99 mmol/L (ref 96–106)
Creatinine, Ser: 0.89 mg/dL (ref 0.76–1.27)
Glucose: 102 mg/dL — ABNORMAL HIGH (ref 70–99)
Potassium: 4.7 mmol/L (ref 3.5–5.2)
Sodium: 133 mmol/L — ABNORMAL LOW (ref 134–144)
eGFR: 89 mL/min/{1.73_m2} (ref >59)

## 2022-09-19 LAB — CBC WITH DIFFERENTIAL/PLATELET
Basophils Absolute: 0 10*3/uL (ref 0.0–0.2)
Basos: 0 %
EOS (ABSOLUTE): 0.1 10*3/uL (ref 0.0–0.4)
Eos: 1 %
Hematocrit: 37.9 % (ref 37.5–51.0)
Hemoglobin: 13.1 g/dL (ref 13.0–17.7)
Lymphocytes Absolute: 0.8 10*3/uL (ref 0.7–3.1)
Lymphs: 14 %
MCH: 33.4 pg — ABNORMAL HIGH (ref 26.6–33.0)
MCHC: 34.6 g/dL (ref 31.5–35.7)
MCV: 97 fL (ref 79–97)
Monocytes Absolute: 0.6 10*3/uL (ref 0.1–0.9)
Monocytes: 10 %
Neutrophils Absolute: 4 10*3/uL (ref 1.4–7.0)
Neutrophils: 75 %
Platelets: 157 10*3/uL (ref 150–450)
RBC: 3.92 x10E6/uL — ABNORMAL LOW (ref 4.14–5.80)
RDW: 13 % (ref 11.6–15.4)
WBC: 5.4 10*3/uL (ref 3.4–10.8)

## 2022-09-19 NOTE — Telephone Encounter (Signed)
Patient called to follow up on lab results. 

## 2022-09-20 NOTE — Telephone Encounter (Signed)
Spoke with the patient and advised lab results have not been reviewed by Dr. Quentin Ore at this time however there is nothing concerning at this time. He should be okay to proceed with cardioversion. Will let him know if anything changes.

## 2022-09-23 ENCOUNTER — Ambulatory Visit (HOSPITAL_COMMUNITY)
Admission: RE | Admit: 2022-09-23 | Discharge: 2022-09-23 | Disposition: A | Discharge status: Home / Self Care | Payer: Medicare Other | Attending: Internal Medicine | Admitting: Internal Medicine

## 2022-09-23 ENCOUNTER — Ambulatory Visit (HOSPITAL_COMMUNITY): Payer: Medicare Other | Admitting: Certified Registered Nurse Anesthetist

## 2022-09-23 ENCOUNTER — Other Ambulatory Visit: Payer: Self-pay

## 2022-09-23 ENCOUNTER — Ambulatory Visit (HOSPITAL_BASED_OUTPATIENT_CLINIC_OR_DEPARTMENT_OTHER): Payer: Medicare Other | Admitting: Certified Registered Nurse Anesthetist

## 2022-09-23 ENCOUNTER — Encounter (HOSPITAL_COMMUNITY)
Admission: RE | Disposition: A | Discharge status: Home / Self Care | Payer: Self-pay | Source: Home / Self Care | Attending: Internal Medicine

## 2022-09-23 DIAGNOSIS — Z01818 Encounter for other preprocedural examination: Secondary | ICD-10-CM

## 2022-09-23 DIAGNOSIS — I4819 Other persistent atrial fibrillation: Secondary | ICD-10-CM | POA: Diagnosis not present

## 2022-09-23 DIAGNOSIS — I10 Essential (primary) hypertension: Secondary | ICD-10-CM

## 2022-09-23 DIAGNOSIS — F319 Bipolar disorder, unspecified: Secondary | ICD-10-CM | POA: Insufficient documentation

## 2022-09-23 DIAGNOSIS — I2510 Atherosclerotic heart disease of native coronary artery without angina pectoris: Secondary | ICD-10-CM

## 2022-09-23 DIAGNOSIS — F419 Anxiety disorder, unspecified: Secondary | ICD-10-CM | POA: Diagnosis not present

## 2022-09-23 DIAGNOSIS — I080 Rheumatic disorders of both mitral and aortic valves: Secondary | ICD-10-CM | POA: Insufficient documentation

## 2022-09-23 DIAGNOSIS — F418 Other specified anxiety disorders: Secondary | ICD-10-CM

## 2022-09-23 DIAGNOSIS — M1990 Unspecified osteoarthritis, unspecified site: Secondary | ICD-10-CM | POA: Diagnosis not present

## 2022-09-23 DIAGNOSIS — I4891 Unspecified atrial fibrillation: Secondary | ICD-10-CM

## 2022-09-23 DIAGNOSIS — Z79899 Other long term (current) drug therapy: Secondary | ICD-10-CM | POA: Insufficient documentation

## 2022-09-23 DIAGNOSIS — I452 Bifascicular block: Secondary | ICD-10-CM | POA: Diagnosis not present

## 2022-09-23 DIAGNOSIS — Z7901 Long term (current) use of anticoagulants: Secondary | ICD-10-CM | POA: Diagnosis not present

## 2022-09-23 HISTORY — PX: CARDIOVERSION: SHX1299

## 2022-09-23 SURGERY — CARDIOVERSION
Anesthesia: General

## 2022-09-23 MED ORDER — PROPOFOL 10 MG/ML IV BOLUS
INTRAVENOUS | Status: DC | PRN
  Administered 2022-09-23: 30 mg via INTRAVENOUS
  Administered 2022-09-23: 70 mg via INTRAVENOUS

## 2022-09-23 MED ORDER — SODIUM CHLORIDE 0.9 % IV SOLN: INTRAVENOUS | Status: DC

## 2022-09-23 NOTE — CV Procedure (Signed)
Procedure: Electrical Cardioversion Indications:  Atrial Fibrillation  Procedure Details:  Consent: Risks of procedure as well as the alternatives and risks of each were explained to the (patient/caregiver).  Consent for procedure obtained.  Time Out: Verified patient identification, verified procedure, site/side was marked, verified correct patient position, special equipment/implants available, medications/allergies/relevent history reviewed, required imaging and test results available. PERFORMED.  Patient placed on cardiac monitor, pulse oximetry, supplemental oxygen as necessary.  Sedation given:  propofol per anesthesia Pacer pads placed anterior and posterior chest.  Cardioverted 1 time(s).  Cardioversion with synchronized biphasic 120J shock.  Evaluation: Findings: Post procedure EKG shows: NSR Complications: None Patient did tolerate procedure well.  Time Spent Directly with the Patient:  30 minutes   Elouise Munroe 09/23/2022, 9:45 AM

## 2022-09-23 NOTE — H&P (Signed)
Please reference note from Quentin Ore MD 08/17/22  HPI: Prior PVI 05/17/22 and subsequent DCCV 07/28/22. Switched from Eliquis to Xarelto for compliance.  No missed doses.  GEN: No acute distress.   Neck: No JVD Cardiac: iRRR, no murmurs, rubs, or gallops.  Respiratory: Clear to auscultation bilaterally. GI: Soft, nontender, non-distended  MS: No edema; No deformity. Neuro:  Nonfocal  Psych: Normal affect   A/P: Plan for repeat DCCV today per EP. Risks, benefits and alternatives of direct current cardioversion reviewed including potential for post-cardioversion rhythms, especially life-threatening arrhythmias (ventricular tachycardia and fibrillation, profound bradycardia). Major complications may include serious or fatal arrhythmias, myocardial damage, and acute pulmonary edema; minor complications include skin burns and transient hypotension. Benefits include restoration of sinus rhythm. Alternatives to treatment were discussed, questions were answered. Patient is willing to proceed.

## 2022-09-23 NOTE — Discharge Instructions (Signed)

## 2022-09-23 NOTE — Transfer of Care (Signed)
Immediate Anesthesia Transfer of Care Note  Patient: Justin [name]  Procedure(s) Performed: CARDIOVERSION  Patient Location: PACU  Anesthesia Type:General  Level of Consciousness: awake, alert , and oriented  Airway & Oxygen Therapy: Patient Spontanous Breathing  Post-op Assessment: Report given to RN, Post -op Vital signs reviewed and unstable, Anesthesiologist notified, Patient moving all extremities X 4, and Patient able to stick tongue midline  Post vital signs: Reviewed  Last Vitals:  Vitals Value Taken Time  BP 134/95   Temp 97.8   Pulse 76   Resp 13   SpO2 100     Last Pain:  Vitals:   09/23/22 0920  TempSrc: Temporal  PainSc: 0-No pain         Complications: No notable events documented.

## 2022-09-26 NOTE — Anesthesia Postprocedure Evaluation (Signed)
Anesthesia Post Note  Patient: Justin [name]  Procedure(s) Performed: CARDIOVERSION     Patient location during evaluation: Endoscopy Anesthesia Type: General Level of consciousness: awake and alert Pain management: pain level controlled Vital Signs Assessment: post-procedure vital signs reviewed and stable Respiratory status: spontaneous breathing and nonlabored ventilation Cardiovascular status: stable and blood pressure returned to baseline Postop Assessment: no apparent nausea or vomiting Anesthetic complications: no   No notable events documented.  Last Vitals:  Vitals:   09/23/22 1010 09/23/22 1020  BP: 137/80 (!) 142/84  Pulse: 66 62  Resp: 12 11  Temp:    SpO2: 98% 98%    Last Pain:  Vitals:   09/23/22 1020  TempSrc:   PainSc: 0-No pain                 Eleftherios Dudenhoeffer

## 2022-09-26 NOTE — Anesthesia Preprocedure Evaluation (Signed)
Anesthesia Evaluation  Patient identified by MRN, date of birth, ID band Patient awake    Reviewed: Allergy & Precautions, NPO status , Patient's Chart, lab work & pertinent test results  History of Anesthesia Complications Negative for: history of anesthetic complications  Airway Mallampati: II  TM Distance: >3 FB Neck ROM: Full    Dental  (+) Poor Dentition, Missing, Dental Advisory Given, Chipped   Pulmonary neg pulmonary ROS   breath sounds clear to auscultation       Cardiovascular hypertension, Pt. on medications (-) angina + CAD and + Cardiac Stents  + Valvular Problems/Murmurs AS  Rhythm:Irregular  02/2022 ECHO: EF 70-75%. LV has hyperdynamic function, no regional wall motion abnormalities, moderate LVH of the basal-septal segment. RVF is normal, normal pulmonary artery systolic pressure. Left atrial size was moderately dilated.  The mitral valve is abnormal. Mild mitral valve regurgitation.  The aortic valve is tricuspid. Aortic valve regurgitation is moderate. Aortic valve sclerosis is present, with no evidence of aortic valve stenosis.     Neuro/Psych  Headaches  Anxiety Depression Bipolar Disorder      GI/Hepatic negative GI ROS, Neg liver ROS,,,  Endo/Other  negative endocrine ROS    Renal/GU negative Renal ROS     Musculoskeletal  (+) Arthritis ,    Abdominal   Peds  Hematology  (+) Blood dyscrasia, anemia Xarelto   Anesthesia Other Findings   Reproductive/Obstetrics                              Anesthesia Physical Anesthesia Plan  ASA: 3  Anesthesia Plan: General   Post-op Pain Management: Minimal or no pain anticipated   Induction: Intravenous  PONV Risk Score and Plan: 2 and Treatment may vary due to age or medical condition  Airway Management Planned: Natural Airway and Mask  Additional Equipment: None  Intra-op Plan:   Post-operative Plan:   Informed  Consent: I have reviewed the patients History and Physical, chart, labs and discussed the procedure including the risks, benefits and alternatives for the proposed anesthesia with the patient or authorized representative who has indicated his/her understanding and acceptance.     Dental advisory given  Plan Discussed with: CRNA  Anesthesia Plan Comments:          Anesthesia Quick Evaluation

## 2022-09-27 ENCOUNTER — Ambulatory Visit (INDEPENDENT_AMBULATORY_CARE_PROVIDER_SITE_OTHER): Payer: Medicare Other | Admitting: Licensed Clinical Social Worker

## 2022-09-27 DIAGNOSIS — F4321 Adjustment disorder with depressed mood: Secondary | ICD-10-CM

## 2022-09-27 DIAGNOSIS — F3162 Bipolar disorder, current episode mixed, moderate: Secondary | ICD-10-CM | POA: Diagnosis not present

## 2022-09-28 ENCOUNTER — Encounter (HOSPITAL_COMMUNITY): Payer: Self-pay | Admitting: Internal Medicine

## 2022-09-29 ENCOUNTER — Encounter (HOSPITAL_COMMUNITY): Payer: Self-pay | Admitting: Licensed Clinical Social Worker

## 2022-09-29 NOTE — Progress Notes (Signed)
Virtual Visit via Video Note  I connected with [name] on 09/29/22 at  2:30 PM EST by a video enabled telemedicine application and verified that I am speaking with the correct person using two identifiers.  Location: Patient: home Provider: home office   I discussed the limitations of evaluation and management by telemedicine and the availability of in person appointments. The patient expressed understanding and agreed to proceed.    I discussed the assessment and treatment plan with the patient. The patient was provided an opportunity to ask questions and all were answered. The patient agreed with the plan and demonstrated an understanding of the instructions.   The patient was advised to call back or seek an in-person evaluation if the symptoms worsen or if the condition fails to improve as anticipated.  I provided 45 minutes of non-face-to-face time during this encounter.   [name], LCSW   THERAPIST PROGRESS NOTE  Session Time: 2:30pm-3:15pm  Participation Level: Active  Behavioral Response: NeatAlertDepressed  Type of Therapy: Individual Therapy  Treatment Goals addressed: LTG: Reduce frequency, intensity, and duration of depression symptoms so that daily functioning is improved    ProgressTowards Goals: Progressing  Interventions: CBT  Summary: THADDEUS EVITTS is a 77 y.o. male who presents with Bipolar I, mixed, moderate and grief.   Suicidal/Homicidal: Nowithout intent/plan  Therapist Response: [name] engaged well in individual session with clinician. Clinician utilized CBT to process thoughts, feelings, and behaviors. Clinician noted that mood has been relatively positive, but he feels a depression is coming soon. Clinician explored upcoming triggers, including the anniversary of his wife's death. Clinician discussed how he can prepare for the day and ways to reclaim the day for joy and gratitude. Clinician also discussed relationships with son,  daughter, daughter-in-law, and sister. Clinician noted that he is working hard to establish boundaries with his son, although he acknowledges it is difficult to maintain. Clinician reflected continuing fear that if the boundary is set, his relationship with son will end. Clinician validated these concerns and noted that he has no control over his son's decision making and never has.    Plan: Return again in 2 weeks.  Diagnosis: Bipolar 1 disorder, mixed, moderate ([place])  Grief  Collaboration of Care: Patient refused AEB none required in this session  Patient/Guardian was advised Release of Information must be obtained prior to any record release in order to collaborate their care with an outside provider. Patient/Guardian was advised if they have not already done so to contact the registration department to sign all necessary forms in order for [place] to release information regarding their care.   Consent: Patient/Guardian gives verbal consent for treatment and assignment of benefits for services provided during this visit. Patient/Guardian expressed understanding and agreed to proceed.   Jobe Marker [place], LCSW 09/29/2022

## 2022-10-06 ENCOUNTER — Ambulatory Visit (INDEPENDENT_AMBULATORY_CARE_PROVIDER_SITE_OTHER): Payer: Medicare Other

## 2022-10-06 ENCOUNTER — Ambulatory Visit: Payer: Self-pay

## 2022-10-06 ENCOUNTER — Ambulatory Visit: Payer: Medicare Other | Admitting: Family Medicine

## 2022-10-06 ENCOUNTER — Encounter: Payer: Self-pay | Admitting: Family Medicine

## 2022-10-06 VITALS — BP 132/80 | HR 77 | Ht 71.0 in | Wt 172.2 lb

## 2022-10-06 DIAGNOSIS — M79645 Pain in left finger(s): Secondary | ICD-10-CM | POA: Diagnosis not present

## 2022-10-06 DIAGNOSIS — M79644 Pain in right finger(s): Secondary | ICD-10-CM

## 2022-10-06 NOTE — Patient Instructions (Addendum)
Thank you for coming in today.   Please get an Xray today before you leave   I've referred you to Occupational Therapy for hand therapy.  Let us know if you don't hear from them in one week.   Please use Voltaren gel (Generic Diclofenac Gel) up to 4x daily for pain as needed.  This is available over-the-counter as both the name brand Voltaren gel and the generic diclofenac gel.   Check back in 6 weeks

## 2022-10-06 NOTE — Progress Notes (Signed)
I, [name], CMA acting as a Education administrator for [name], MD.  Subjective:    CC: Bilat thumb pain  HPI: Pt is a 77 y/o male c/o bilat thumb pain x several months, worsening since yesterday.   Pt locates pain to base of the thumbs and finger joints.  His pain is worse at the base of the left thumb.  He also notes significant pain across both hands.  He notes significant stiffness both hands.  For example he has had difficulty holding a coffee cup or opening doors and putting clothes on.  Grip strength: YES, dropping things at times. Swelling present.  Paresthesias: pt denies.  Aggravates: worse with finger flexion, pain even when at rest.  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   Dx imaging: 06/09/17 R wrist XR  Pertinent review of Systems: No fevers or chills  Relevant historical information: Hypertension.  Bipolar disorder. Gout  Objective:    Vitals:   10/06/22 1342  BP: 132/80  Pulse: 77  SpO2: 99%   General: Well Developed, well nourished, and in no acute distress.   MSK: Hands bilaterally swelling across MCPs.  Bossing at first [facility] bilaterally left worse.  Ulnar deviation present at second DIP. Tender palpation especially at left first [place]. Decreased grip motion and strength.  Lab and Radiology Results  Procedure: Real-time Ultrasound Guided Injection of left first [facility] Device: Philips Affiniti 50G Images permanently stored and available for review in PACS Verbal informed consent obtained.  Discussed risks and benefits of procedure. Warned about infection, bleeding, hyperglycemia damage to structures among others. Patient expresses understanding and agreement Time-out conducted.   Noted no overlying erythema, induration, or other signs of local infection.   Skin prepped in a sterile fashion.   Local anesthesia: Topical Ethyl chloride.   With sterile technique and under real time ultrasound guidance: 20 mg of Kenalog and 0.5 mL lidocaine of  injected into first [place]. Fluid seen entering the first [facility].   Completed without difficulty   Pain immediately resolved suggesting accurate placement of the medication.   Advised to call if fevers/chills, erythema, induration, drainage, or persistent bleeding.   Images permanently stored and available for review in the ultrasound unit.  Impression: Technically successful ultrasound guided injection.   X-ray images hands bilaterally obtained today personally and independently interpreted  Right hand: Significant DJD second DIP with erosions.  Left hand: Severe first CMC DJD.  Ulnar deviation at second DIP.  No acute fractures.  Await formal radiology review    Impression and Recommendations:    Assessment and Plan: 77 y.o. male with significant hand pain bilaterally thought to be due to DJD.  Plan for first [facility] injection left hand and referral to Occupational Therapy for hand therapy.  Recheck in 6 weeks.  Recommend also Voltaren gel.Marland Kitchen  PDMP not reviewed this encounter. Orders Placed This Encounter  Procedures   [place] LIMITED JOINT SPACE STRUCTURES UP BILAT(NO LINKED CHARGES)    Order Specific Question:   Reason for Exam (SYMPTOM  OR DIAGNOSIS REQUIRED)    Answer:   bilateral thumb pain    Order Specific Question:   Preferred imaging location?    Answer:   [place]   DG Hand Complete Right    Standing Status:   Future    Number of Occurrences:   1    Standing Expiration Date:   10/07/2023    Order Specific Question:   Reason for Exam (SYMPTOM  OR  DIAGNOSIS REQUIRED)    Answer:   bilateral thumb pain    Order Specific Question:   Preferred imaging location?    Answer:   [facility]   DG Hand Complete Left    Standing Status:   Future    Number of Occurrences:   1    Standing Expiration Date:   10/07/2023    Order Specific Question:   Reason for Exam (SYMPTOM  OR DIAGNOSIS REQUIRED)    Answer:   bilateal thumb pain    Order Specific Question:    Preferred imaging location?    Answer:   [facility]   [place] LIMITED JOINT SPACE STRUCTURES UP BILAT    Order Specific Question:   Reason for Exam (SYMPTOM  OR DIAGNOSIS REQUIRED)    Answer:   bilat thumb pain    Order Specific Question:   Preferred imaging location?    Answer:   [place]   Ambulatory referral to Occupational Therapy    Referral Priority:   Routine    Referral Type:   Occupational Therapy    Referral Reason:   Specialty Services Required    Requested Specialty:   Occupational Therapy    Number of Visits Requested:   1   No orders of the defined types were placed in this encounter.   Discussed warning signs or symptoms. Please see discharge instructions. Patient expresses understanding.   The above documentation has been reviewed and is accurate and complete [name], M.D.

## 2022-10-10 NOTE — Progress Notes (Signed)
Right hand arthritis shows significant arthritis at the base of the thumb and the wrist area.

## 2022-10-10 NOTE — Progress Notes (Signed)
Left hand x-ray shows significant arthritis at the base of the thumb.  There is also some arthritis at the end of the index finger and middle finger.

## 2022-10-11 ENCOUNTER — Ambulatory Visit (INDEPENDENT_AMBULATORY_CARE_PROVIDER_SITE_OTHER): Payer: Medicare Other | Admitting: Licensed Clinical Social Worker

## 2022-10-11 ENCOUNTER — Encounter (HOSPITAL_COMMUNITY): Payer: Self-pay | Admitting: Licensed Clinical Social Worker

## 2022-10-11 DIAGNOSIS — F3162 Bipolar disorder, current episode mixed, moderate: Secondary | ICD-10-CM

## 2022-10-11 DIAGNOSIS — F4321 Adjustment disorder with depressed mood: Secondary | ICD-10-CM

## 2022-10-11 NOTE — Progress Notes (Signed)
Virtual Visit via Video Note  I connected with [name] on 10/11/22 at  2:30 PM EST by a video enabled telemedicine application and verified that I am speaking with the correct person using two identifiers.  Location: Patient: home Provider: office   I discussed the limitations of evaluation and management by telemedicine and the availability of in person appointments. The patient expressed understanding and agreed to proceed.   I discussed the assessment and treatment plan with the patient. The patient was provided an opportunity to ask questions and all were answered. The patient agreed with the plan and demonstrated an understanding of the instructions.   The patient was advised to call back or seek an in-person evaluation if the symptoms worsen or if the condition fails to improve as anticipated.  I provided 45 minutes of non-face-to-face time during this encounter.   [name], LCSW   THERAPIST PROGRESS NOTE  Session Time: 2:30pm-3:15pm  Participation Level: Active  Behavioral Response: Well GroomedAlertDepressed and Euthymic  Type of Therapy: Individual Therapy  Treatment Goals addressed: LTG: Reduce frequency, intensity, and duration of depression symptoms so that daily functioning is improved   ProgressTowards Goals: Progressing  Interventions: CBT  Summary: AERIK BANDURA is a 77 y.o. male who presents with Bipolar I mixed, moderate and grief.   Suicidal/Homicidal: Nowithout intent/plan  Therapist Response: [name] engaged well in individual virtual session. Clinician utilized CBT to process thoughts, feelings, and interactions. Clinician explored updates in relationship with son, noting that "the wall" has been built up in order for [name] to protect his feelings when his son is unkind or uncaring. Clinician processed the challenges of putting up this wall, as well as the sadness that the wall is required. Clinician discussed the importance of boundaries  and explored other ways these boundaries can be put in place. Clinician explored socialization and noted some social interactions with community members, neighbors, hairdresser, Social research officer, government. Clinician also noted that altruism has become a key aspect of [name]'s socialization, which has helped to increase positive mood. [name] shared preparation beginning for March, which is triggering due to the anniversary of his wife's death.   Plan: Return again in 2 weeks.  Diagnosis: Bipolar 1 disorder, mixed, moderate (HCC)  Grief  Collaboration of Care: Psychiatrist AEB communicated with Dr. Adele Schilder regarding [name]'s concerns about his memory. [name] shared that he has been more forgetful lately, leaving the sink running, not remembering if he locked the door, etc. Clinician also encouraged [name] to make a list for his neurologist that he will see in March.  Patient/Guardian was advised Release of Information must be obtained prior to any record release in order to collaborate their care with an outside provider. Patient/Guardian was advised if they have not already done so to contact the registration department to sign all necessary forms in order for [place] to release information regarding their care.   Consent: Patient/Guardian gives verbal consent for treatment and assignment of benefits for services provided during this visit. Patient/Guardian expressed understanding and agreed to proceed.   Jobe Marker Chauncey, LCSW 10/11/2022

## 2022-10-13 NOTE — Therapy (Incomplete)
OUTPATIENT OCCUPATIONAL THERAPY ORTHO EVALUATION  Patient Name: Justin [name] MRN: [id number] DOB:1946/05/29, 77 y.o., male Today's Date: 10/13/2022  PCP: Cathlean Cower, MD REFERRING PROVIDER: Gregor Hams, MD   END OF SESSION:   Past Medical History:  Diagnosis Date   Anxiety and depression    Aortic insufficiency 03/11/2022   Echocardiogram 02/2022: EF 70-75, no RWMA, moderate LVH, normal RVSF, normal PASP (RVSP 20), moderate LAE, mild MR, moderate AI, AV sclerosis without stenosis, no aortic root or ascending aorta dilation   Arthritis    knees, c-spine // gets lumbar ESI q 6 mos   Bipolar disorder (HCC)    CAD (coronary artery disease)    [place] 08/2009 >> LHC ([facility]) - mLAD 70, Dx 65 >> PCI:  3x15 mm Endeavor DES to mLAD and 2.5x12 mm Endeavor DES to Dx // S/p NSTEMI >> LHC 8/12 ([facility]) - LM ok, LAD prox and mid 40; LAD stent ok, Dx stent ok, mRCA 30, mLCx 50 >> med Rx // ETT-Echo 2/17 ([facility]): Normal, EF 55-60 at rest   Chicken pox    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[place])    Grief    History of echocardiogram    Echo 3/19: Mild concentric LVH, EF 60-65, normal wall motion, grade 1 diastolic dysfunction, mild AI, mildly dilated aortic root (40 mm), MAC, mild LAE, atrial septal lipomatous hypertrophy // Echo 8/22: EF 55-60, no RWMA, mild LVH, GRII DD, normal RVSF, RVSP 40, mild LAE, trivial MR, mild-moderate AI, AV sclerosis without stenosis, mild dilation of aortic root (39 mm)   History of non-ST elevation myocardial infarction (NSTEMI)    History of nuclear stress test    Nuclear stress test 3/19: EF 57, inferior/inferoseptal/inferolateral defect consistent with probable soft tissue attenuation (cannot exclude subendocardial scar), no ischemia, intermediate risk >> Echo 3/19 normal EF, normal wall motion   History of pneumonia 2011   History of prostatitis 1990s   Hx of adenomatous colonic polyps 06/22/2016   Hyperlipidemia    Hypertension    Migraines    prior history    PTSD (post-traumatic stress disorder)    Past Surgical History:  Procedure Laterality Date   ATRIAL FIBRILLATION ABLATION N/A 05/17/2022   Procedure: ATRIAL FIBRILLATION ABLATION;  Surgeon: Vickie Epley, MD;  Location: [place] CV LAB;  Service: Cardiovascular;  Laterality: N/A;   CARDIOVERSION N/A 08/05/2021   Procedure: CARDIOVERSION;  Surgeon: Acie Fredrickson Wonda Cheng, MD;  Location: [facility] ENDOSCOPY;  Service: Cardiovascular;  Laterality: N/A;   CARDIOVERSION N/A 07/28/2022   Procedure: CARDIOVERSION;  Surgeon: [name], MD;  Location: [facility] ENDOSCOPY;  Service: Cardiovascular;  Laterality: N/A;   CARDIOVERSION N/A 09/23/2022   Procedure: CARDIOVERSION;  Surgeon: Elouise Munroe, MD;  Location: [facility] ENDOSCOPY;  Service: Cardiovascular;  Laterality: N/A;   CERVICAL DISCECTOMY     COLONOSCOPY  03/2017   ELBOW SURGERY Left    ELBOW SURGERY Right    INGUINAL HERNIA REPAIR Bilateral    1996, 1997   KNEE ARTHROSCOPY Right    KNEE CARTILAGE SURGERY Left    NASAL SEPTUM SURGERY     STENT PLACEMENT VASCULAR ([place] [facility])  09/02/2010   TONSILLECTOMY     TOTAL KNEE ARTHROPLASTY Left 08/31/2018   Procedure: TOTAL KNEE ARTHROPLASTY;  Surgeon: Dorna Leitz, MD;  Location: [facility] ORS;  Service: Orthopedics;  Laterality: Left;   Patient Active Problem List   Diagnosis Date Noted   Atrial fibrillation ([place]) 05/17/2022   Intractable acute post-traumatic headache 05/13/2022   Aortic  insufficiency 03/11/2022   Acute gouty arthritis 02/02/2022   Secondary hypercoagulable state ([place]) 06/03/2021   Thoracic aortic aneurysm without rupture ([place]) 05/06/2021   Persistent atrial fibrillation ([place]) 05/06/2021   Vitamin D deficiency 12/26/2020   Insomnia 11/22/2019   Moderate protein-calorie malnutrition ([place]) 10/15/2019   Nystagmus, end-position 10/15/2019   Tremor observed on examination 10/15/2019   Amnesia memory loss 10/15/2019   Cognitive decline 10/15/2019   Dementia arising in the senium and presenium  ([place]) 10/15/2019   Hyperfunction of pituitary gland, unspecified ([place]) 10/15/2019   Sedative, hypnotic or anxiolytic dependence with withdrawal, 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   Memory loss or impairment 08/08/2019   Postoperative anemia due to acute blood loss 09/03/2018   Primary osteoarthritis of left knee 08/31/2018   Neck pain 12/08/2017   Hyperlipidemia 10/30/2017   Pain in right wrist 07/11/2017   Anemia 06/19/2017   Hyperglycemia 06/19/2017   Elevated prolactin level 06/19/2017   Closed fracture of right hand 06/19/2017   Bipolar affective disorder, depressed, severe ([place]) 06/12/2017   Benzodiazepine withdrawal with complication ([place]) [id number]   Nail lesion 02/25/2017   Hx of adenomatous colonic polyps 06/22/2016   Memory changes 06/09/2016   CAD (coronary artery disease)    Right knee pain 01/01/2016   Olecranon bursitis of right elbow 12/10/2015   Bipolar I disorder ([place]) 06/26/2015   Encounter for well adult exam with abnormal findings 03/26/2015   Medicare annual wellness visit, subsequent 03/26/2015   Rash and nonspecific skin eruption 03/13/2015   Generalized anxiety disorder 12/23/2014   Low back pain 12/23/2014   Hypertension 12/23/2014    ONSET DATE: ***  REFERRING DIAG: M79.644,M79.645 (ICD-10-CM) - Bilateral thumb pain   THERAPY DIAG:  No diagnosis found.  Rationale for Evaluation and Treatment: Rehabilitation  SUBJECTIVE:   SUBJECTIVE STATEMENT: He states ***.    PERTINENT HISTORY: Per MD referral: "Evaluate and Treat for bilat thumb pain, R>L.  1-2 times per week for 4-6 weeks.  Decrease pain, increase strength, flexibility, function, and range of motion. Modalities may include, traction, ionto, phono, stim, and dry needling prn."   PRECAUTIONS: {Therapy precautions:24002}  WEIGHT BEARING RESTRICTIONS: {Yes ***/No:24003}  PAIN:  Are you having pain? *** in *** Rating: ***/10 at rest now, up to ***/10 at worst in past week   FALLS: Has  patient fallen in last 6 months? {fallsyesno:27318}  LIVING ENVIRONMENT: Lives with: {OPRC lives with:25569::"lives with their family"} Lives in: {Lives in:25570} Stairs: {opstairs:27293} Has following equipment at home: {Assistive devices:23999}  PLOF: {PLOF:24004}  PATIENT GOALS: ***  OBJECTIVE: (All objective assessments below are from initial evaluation on: *** unless otherwise specified.)    HAND DOMINANCE: Right ***  ADLs: Overall ADLs: States decreased ability to grab, hold household objects, pain and inability to open containers, perform FMS tasks (manipulate fasteners on clothing), mild to moderate bathing problems as well. ***   FUNCTIONAL OUTCOME MEASURES: Eval: Quck DASH ***% impairment today  (Higher % Score  =  More Impairment)    Patient Specific Functional Scale: *** (***, ***, ***)  (Higher Score  =  Better Ability for the Selected Tasks)     Patient Rated Wrist Evaluation (PRWE): Pain: ***/50; Function: ***/50; Total Score: ***/100 (Higher Score  =  More Pain and/or Debility)    UPPER EXTREMITY ROM     Shoulder to Wrist AROM Right eval Left eval  Forearm supination    Forearm pronation     Wrist flexion    Wrist extension    Wrist ulnar  deviation    Wrist radial deviation    Functional dart thrower's motion (F-DTM) in ulnar flexion    F-DTM in radial extension     (Blank rows = not tested)   Hand AROM Right eval Left eval  Full Fist Ability (or Gap to Distal Palmar Crease)    Thumb Opposition  (Kapandji Scale)     Thumb MCP (0-60)    Thumb IP (0-80)    Thumb Radial Abduction Span     Thumb Palmar Abduction Span     Index MCP (0-90)     Index PIP (0-100)     Index DIP (0-70)      Long MCP (0-90)      Long PIP (0-100)      Long DIP (0-70)      Ring MCP (0-90)      Ring PIP (0-100)      Ring DIP (0-70)      Little MCP (0-90)      Little PIP (0-100)      Little DIP (0-70)      (Blank rows = not tested)   UPPER EXTREMITY MMT:     Eval: *** NT at eval due to recent and still healing injuries. Will be tested when appropriate.   MMT Right TBD Left TBD  Shoulder flexion    Shoulder abduction    Shoulder adduction    Shoulder extension    Shoulder internal rotation    Shoulder external rotation    Middle trapezius    Lower trapezius    Elbow flexion    Elbow extension    Forearm supination    Forearm pronation    Wrist flexion    Wrist extension    Wrist ulnar deviation    Wrist radial deviation    (Blank rows = not tested)  HAND FUNCTION: Eval: Observed weakness in affected hand.  Grip strength Right: *** lbs, Left: *** lbs   COORDINATION: Eval: Observed coordination impairments with affected hand. Box and Blocks Test: *** Blocks today (*** is [facility]); 9 Hole Peg Test Right: ***sec, Left: *** sec (*** sec is WFL)   SENSATION: Eval: *** Light touch intact today, though diminished around sx area    EDEMA:   Eval: *** Mildly swollen in hand and wrist today, ***cm circumferentially around ***  COGNITION: Eval: Overall cognitive status: WFL for evaluation today ***  OBSERVATIONS:   Eval: ***   TODAY'S TREATMENT:  Post-evaluation treatment: ***    PATIENT EDUCATION: Education details: See tx section above for details  Person educated: Patient Education method: Veterinary surgeon, Teach back, Handouts  Education comprehension: States and demonstrates understanding, Additional Education required    HOME EXERCISE PROGRAM: See tx section above for details    GOALS: Goals reviewed with patient? Yes   SHORT TERM GOALS: (STG required if POC>30 days) Target Date: ***  Pt will obtain protective, custom orthotic. Goal status: TBD/PRN,  MET ***  2.  Pt will demo/state understanding of initial HEP to improve pain levels and prerequisite motion. Goal status: INITIAL   LONG TERM GOALS: Target Date: ***  Pt will improve functional ability by decreased impairment per Quick DASH / PSFS / PRWE  assessment from *** to *** or better, for better quality of life. Goal status: INITIAL  2.  Pt will improve grip strength in *** hand from ***lbs to at least ***lbs for functional use at home and in IADLs. Goal status: INITIAL  3.  Pt will improve A/ROM in ***  from *** to at least ***, to have functional motion for tasks like reach and grasp.  Goal status: INITIAL  4.  Pt will improve strength in *** from *** MMT to at least *** MMT to have increased functional ability to carry out selfcare and higher-level homecare tasks with no difficulty. Goal status: INITIAL  5.  Pt will improve coordination skills in ***, as seen by better score on *** testing to have increased functional ability to carry out fine motor tasks (fasteners, etc.) and more complex, coordinated IADLs (meal prep, sports, etc.).  Goal status: INITIAL  6.  Pt will decrease pain at worst from ***/10 to ***/10 or better to have better sleep and occupational participation in daily roles. Goal status: INITIAL   ASSESSMENT:  CLINICAL IMPRESSION: Patient is a 77 y.o. male who was seen today for occupational therapy evaluation for ***.   PERFORMANCE DEFICITS: in functional skills including {OT physical skills:25468}, cognitive skills including {OT cognitive skills:25469}, and psychosocial skills including {OT psychosocial skills:25470}.   IMPAIRMENTS: are limiting patient from {OT performance deficits:25471}.   COMORBIDITIES: {Comorbidities:25485} that affects occupational performance. Patient will benefit from skilled OT to address above impairments and improve overall function.  MODIFICATION OR ASSISTANCE TO COMPLETE EVALUATION: {OT modification:25474}  OT OCCUPATIONAL PROFILE AND HISTORY: {OT PROFILE AND HISTORY:25484}  CLINICAL DECISION MAKING: {OT CDM:25475}  REHAB POTENTIAL: {rehabpotential:25112}  EVALUATION COMPLEXITY: {Evaluation complexity:25115}      PLAN:  OT FREQUENCY: {rehab frequency:25116}  OT  DURATION: {rehab duration:25117}  PLANNED INTERVENTIONS: self care/ADL training, therapeutic exercise, therapeutic activity, neuromuscular re-education, manual therapy, passive range of motion, splinting, ultrasound, fluidotherapy, compression bandaging, moist heat, cryotherapy, contrast bath, patient/family education, energy conservation, coping strategies training, DME and/or AE instructions, and Dry needling  RECOMMENDED OTHER SERVICES: none now   CONSULTED AND AGREED WITH PLAN OF CARE: Patient  PLAN FOR NEXT SESSION: ***   Benito Mccreedy, OTR/L, CHT 10/13/2022, 11:32 AM

## 2022-10-18 ENCOUNTER — Encounter: Payer: Medicare Other | Admitting: Rehabilitative and Restorative Service Providers"

## 2022-10-19 ENCOUNTER — Ambulatory Visit: Payer: Medicare Other | Admitting: Family Medicine

## 2022-10-19 NOTE — Progress Notes (Deleted)
    Justin [name] is a 77 y.o. male who presents to [place] at [facility] today for right thumb pain. Pt was last seen by Dr. Georgina Snell on 10/06/22 and received left 1st CMC inj. Today, patient reports ***.   Diagnostic Imaging   10/06/22 - XR Bilat hands   Pertinent review of systems: ***  Relevant historical information: ***   Exam:  There were no vitals taken for this visit. General: Well Developed, well nourished, and in no acute distress.   MSK: ***    Lab and Radiology Results No results found for this or any previous visit (from the past 72 hour(s)). No results found.     Assessment and Plan: 77 y.o. male with ***   PDMP not reviewed this encounter. No orders of the defined types were placed in this encounter.  No orders of the defined types were placed in this encounter.    Discussed warning signs or symptoms. Please see discharge instructions. Patient expresses understanding.   ***

## 2022-10-24 ENCOUNTER — Encounter: Payer: Self-pay | Admitting: Rehabilitative and Restorative Service Providers"

## 2022-10-24 ENCOUNTER — Other Ambulatory Visit: Payer: Self-pay

## 2022-10-24 ENCOUNTER — Encounter: Payer: Self-pay | Admitting: Family Medicine

## 2022-10-24 ENCOUNTER — Ambulatory Visit: Payer: Medicare Other | Admitting: Rehabilitative and Restorative Service Providers"

## 2022-10-24 ENCOUNTER — Ambulatory Visit: Payer: Medicare Other | Admitting: Family Medicine

## 2022-10-24 ENCOUNTER — Ambulatory Visit: Payer: Self-pay

## 2022-10-24 VITALS — BP 160/86 | HR 74 | Ht 71.0 in | Wt 172.1 lb

## 2022-10-24 DIAGNOSIS — M79645 Pain in left finger(s): Secondary | ICD-10-CM | POA: Diagnosis not present

## 2022-10-24 DIAGNOSIS — R278 Other lack of coordination: Secondary | ICD-10-CM

## 2022-10-24 DIAGNOSIS — M25542 Pain in joints of left hand: Secondary | ICD-10-CM

## 2022-10-24 DIAGNOSIS — M25632 Stiffness of left wrist, not elsewhere classified: Secondary | ICD-10-CM

## 2022-10-24 DIAGNOSIS — M25631 Stiffness of right wrist, not elsewhere classified: Secondary | ICD-10-CM | POA: Diagnosis not present

## 2022-10-24 DIAGNOSIS — G8929 Other chronic pain: Secondary | ICD-10-CM | POA: Diagnosis not present

## 2022-10-24 DIAGNOSIS — M25541 Pain in joints of right hand: Secondary | ICD-10-CM

## 2022-10-24 DIAGNOSIS — M6281 Muscle weakness (generalized): Secondary | ICD-10-CM

## 2022-10-24 DIAGNOSIS — M79644 Pain in right finger(s): Secondary | ICD-10-CM

## 2022-10-24 NOTE — Therapy (Addendum)
OUTPATIENT OCCUPATIONAL THERAPY ORTHO EVALUATION & DISCHARGE NOTE  Patient Name: Justin [name] MRN: [id number] DOB:10-21-1945, 77 y.o., male Today's Date: 10/24/2022  PCP: Cathlean Cower, MD REFERRING PROVIDER: Gregor Hams, MD   END OF SESSION:  OT End of Session - 10/24/22 1239     Visit Number 1    Number of Visits 8    Date for OT Re-Evaluation 12/09/22    Authorization Type UHC Medicare    Progress Note Due on Visit 10    OT Start Time 1259    OT Stop Time 1406    OT Time Calculation (min) 67 min    Equipment Utilized During Treatment orthotic materials    Activity Tolerance Patient tolerated treatment well;Patient limited by fatigue;Patient limited by pain    Behavior During Therapy [facility] for tasks assessed/performed             Past Medical History:  Diagnosis Date   Anxiety and depression    Aortic insufficiency 03/11/2022   Echocardiogram 02/2022: EF 70-75, no RWMA, moderate LVH, normal RVSF, normal PASP (RVSP 20), moderate LAE, mild MR, moderate AI, AV sclerosis without stenosis, no aortic root or ascending aorta dilation   Arthritis    knees, c-spine // gets lumbar ESI q 6 mos   Bipolar disorder ([place])    CAD (coronary artery disease)    [place] 08/2009 >> LHC ([facility]) - mLAD 70, Dx 65 >> PCI:  3x15 mm Endeavor DES to mLAD and 2.5x12 mm Endeavor DES to Dx // S/p NSTEMI >> LHC 8/12 ([facility]) - LM ok, LAD prox and mid 40; LAD stent ok, Dx stent ok, mRCA 30, mLCx 50 >> med Rx // ETT-Echo 2/17 ([facility]): Normal, EF 55-60 at rest   Chicken pox    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[place])    Grief    History of echocardiogram    Echo 3/19: Mild concentric LVH, EF 60-65, normal wall motion, grade 1 diastolic dysfunction, mild AI, mildly dilated aortic root (40 mm), MAC, mild LAE, atrial septal lipomatous hypertrophy // Echo 8/22: EF 55-60, no RWMA, mild LVH, GRII DD, normal RVSF, RVSP 40, mild LAE, trivial MR, mild-moderate AI, AV sclerosis without stenosis, mild dilation of aortic  root (39 mm)   History of non-ST elevation myocardial infarction (NSTEMI)    History of nuclear stress test    Nuclear stress test 3/19: EF 57, inferior/inferoseptal/inferolateral defect consistent with probable soft tissue attenuation (cannot exclude subendocardial scar), no ischemia, intermediate risk >> Echo 3/19 normal EF, normal wall motion   History of pneumonia 2011   History of prostatitis 1990s   Hx of adenomatous colonic polyps 06/22/2016   Hyperlipidemia    Hypertension    Migraines    prior history   PTSD (post-traumatic stress disorder)    Past Surgical History:  Procedure Laterality Date   ATRIAL FIBRILLATION ABLATION N/A 05/17/2022   Procedure: ATRIAL FIBRILLATION ABLATION;  Surgeon: Vickie Epley, MD;  Location: [place] CV LAB;  Service: Cardiovascular;  Laterality: N/A;   CARDIOVERSION N/A 08/05/2021   Procedure: CARDIOVERSION;  Surgeon: Acie Fredrickson Wonda Cheng, MD;  Location: [facility] ENDOSCOPY;  Service: Cardiovascular;  Laterality: N/A;   CARDIOVERSION N/A 07/28/2022   Procedure: CARDIOVERSION;  Surgeon: [name], MD;  Location: [facility] ENDOSCOPY;  Service: Cardiovascular;  Laterality: N/A;   CARDIOVERSION N/A 09/23/2022   Procedure: CARDIOVERSION;  Surgeon: Elouise Munroe, MD;  Location: [facility] ENDOSCOPY;  Service: Cardiovascular;  Laterality: N/A;   CERVICAL DISCECTOMY  COLONOSCOPY  03/2017   ELBOW SURGERY Left    ELBOW SURGERY Right    INGUINAL HERNIA REPAIR Bilateral    1996, 1997   KNEE ARTHROSCOPY Right    KNEE CARTILAGE SURGERY Left    NASAL SEPTUM SURGERY     STENT PLACEMENT VASCULAR ([place] [facility])  09/02/2010   TONSILLECTOMY     TOTAL KNEE ARTHROPLASTY Left 08/31/2018   Procedure: TOTAL KNEE ARTHROPLASTY;  Surgeon: Dorna Leitz, MD;  Location: [facility] ORS;  Service: Orthopedics;  Laterality: Left;   Patient Active Problem List   Diagnosis Date Noted   Atrial fibrillation ([place]) 05/17/2022   Intractable acute post-traumatic headache 05/13/2022   Aortic  insufficiency 03/11/2022   Acute gouty arthritis 02/02/2022   Secondary hypercoagulable state ([place]) 06/03/2021   Thoracic aortic aneurysm without rupture ([place]) 05/06/2021   Persistent atrial fibrillation ([place]) 05/06/2021   Vitamin D deficiency 12/26/2020   Insomnia 11/22/2019   Moderate protein-calorie malnutrition ([place]) 10/15/2019   Nystagmus, end-position 10/15/2019   Tremor observed on examination 10/15/2019   Amnesia memory loss 10/15/2019   Cognitive decline 10/15/2019   Dementia arising in the senium and presenium ([place]) 10/15/2019   Hyperfunction of pituitary gland, unspecified ([place]) 10/15/2019   Sedative, hypnotic or anxiolytic dependence with withdrawal, unspecified ([place]) 10/15/2019   Memory loss or impairment 08/08/2019   Postoperative anemia due to acute blood loss 09/03/2018   Primary osteoarthritis of left knee 08/31/2018   Neck pain 12/08/2017   Hyperlipidemia 10/30/2017   Pain in right wrist 07/11/2017   Anemia 06/19/2017   Hyperglycemia 06/19/2017   Elevated prolactin level 06/19/2017   Closed fracture of right hand 06/19/2017   Bipolar affective disorder, depressed, severe ([place]) 06/12/2017   Benzodiazepine withdrawal with complication ([place]) [id number]   Nail lesion 02/25/2017   Hx of adenomatous colonic polyps 06/22/2016   Memory changes 06/09/2016   CAD (coronary artery disease)    Right knee pain 01/01/2016   Olecranon bursitis of right elbow 12/10/2015   Bipolar I disorder ([place]) 06/26/2015   Encounter for well adult exam with abnormal findings 03/26/2015   Medicare annual wellness visit, subsequent 03/26/2015   Rash and nonspecific skin eruption 03/13/2015   Generalized anxiety disorder 12/23/2014   Low back pain 12/23/2014   Hypertension 12/23/2014    ONSET DATE: chronic 7-8 months ago   REFERRING DIAG: M79.644,M79.645 (ICD-10-CM) - Bilateral thumb pain   THERAPY DIAG:  Pain in joint of right hand - Plan: Ot plan of care cert/re-cert  Pain in joint  of left hand - Plan: Ot plan of care cert/re-cert  Stiffness of right wrist, not elsewhere classified - Plan: Ot plan of care cert/re-cert  Other lack of coordination - Plan: Ot plan of care cert/re-cert  Muscle weakness (generalized) - Plan: Ot plan of care cert/re-cert  Stiffness of left wrist, not elsewhere classified - Plan: Ot plan of care cert/re-cert  Rationale for Evaluation and Treatment: Rehabilitation  SUBJECTIVE:   SUBJECTIVE STATEMENT: He states typically having b/l thumb pain Lt > Rt. He states having a cortisone injection to left thumb ~2 weeks ago which helped a great deal. Now his Rt thumb hurts worse, and he does state hx of breaking Rt wrist in the past. He stats dropping 2 cups of coffee this morning, having a hard time with ADLs, etc.    PERTINENT HISTORY: Per MD referral: "Evaluate and Treat for bilat thumb pain, R>L.  1-2 times per week for 4-6 weeks.  Decrease pain, increase strength, flexibility, function, and range  of motion. Modalities may include, traction, ionto, phono, stim, and dry needling prn."   PRECAUTIONS: Other: hx of heart disease ; WEIGHT BEARING RESTRICTIONS: No  PAIN:  Are you having pain? Yes in Rt and Lt thumb CMC Js today  Rating: 6/10 at rest now in Rt thumb (3/10 in Lt thumb)    FALLS: Has patient fallen in last 6 months? No  LIVING ENVIRONMENT: Lives with: lives alone  PLOF: Independent  PATIENT GOALS: To get management techniques for bilateral thumb and hand pain   OBJECTIVE: (All objective assessments below are from initial evaluation on: 10/24/22 unless otherwise specified.)   HAND DOMINANCE: Right   ADLs: Overall ADLs: States decreased ability to grab, hold household objects, pain and inability to open containers, perform FMS tasks (manipulate fasteners on clothing), mild to moderate bathing problems as well.    FUNCTIONAL OUTCOME MEASURES: Eval: Quck DASH 43% impairment today  (Higher % Score  =  More Impairment)      UPPER EXTREMITY ROM     Shoulder to Wrist AROM Right eval Left eval  Wrist flexion 46 53  Wrist extension 58 67  (Blank rows = not tested)   Hand AROM Right eval Left eval  Full Fist Ability (or Gap to Distal Palmar Crease) Full but loose  full  Thumb Opposition  (Kapandji Scale)  8 painful 9  Thumb MCP (0-60) 0 -59 0- 48  Thumb IP (0-80) 0 -47 0- 63  (Blank rows = not tested)   HAND FUNCTION: Eval: Observed weakness in affected hand.  Grip strength Right: 11 lbs painful, Left: 73 lbs   COORDINATION: Eval: Observed coordination impairments with affected Rt hand, details TBD next session.   SENSATION: Eval:  Light touch intact today b/l hands  EDEMA:   Eval: Mildly swollen and red in both hands near base of thumbs and also in MCP Js   COGNITION: Eval: Overall cognitive status: WFL for evaluation today   OBSERVATIONS:   Eval: Tender to palpation around the base of the right thumb and less so in the left thumb after injection.  He has overt pain signs when trying to grip or move his thumb with obvious gross and fine motor skills coordination deficits.  He presents like bilateral thumb CMC arthritis.  He also has some tightness in the interphalangeal joints that he complains about with gripping activity today.   TODAY'S TREATMENT:  Post-evaluation treatment: Custom orthotic fabrication was indicated due to pt's painful right thumb and need for safe, functional positioning. OT fabricated custom hand-based thumb spica orthotic for pt today to rest sore right thumb. It fit well with no areas of pressure, pt states a comfortable fit and that he feels better almost immediately after putting it on. Pt was educated on the wearing schedule, to call or come in ASAP if it is causing any irritation or is not achieving desired function. It will be checked/adjusted in upcoming sessions, as needed. Pt states understanding.   Additionally he was given the exercises below called the  "stable C" program to help manage thumb arthritis, stiffness at the wrist and pain.  "Hook" stretches were also signed for stiffness and tightness in the interphalangeal joints.  He was only able to get through the bolded exercises today due to time of evaluation.  Exercises - Seated Wrist Flexion Stretch  - 3 x daily - 3 reps - 15 hold - Wrist Prayer Stretch  - 3 x daily - 3 reps - 15 sec hold -  HOOK Stretch  - 4 x daily - 3-5 reps - 15-20 sec hold - Stretch Thumb DOWNWARD  - 3 x daily - 3 reps - 15 sec hold - Thumb Webspace Stretch  - 3 x daily - 3 reps - 15 sec hold - Towel Roll Grip with Forearm in Neutral  - 3 x daily - 5 reps - 10 sec hold - Spread Index Finger Apart  - 3 x daily - 5 reps - 5 sec hold - C-Strength (try using rubber band)   - 3 x daily - 5 reps - 5 sec hold   PATIENT EDUCATION: Education details: See tx section above for details  Person educated: Patient Education method: Verbal Instruction, Teach back, Handouts  Education comprehension: States and demonstrates understanding, Additional Education required    HOME EXERCISE PROGRAM: Access Code: [email or web address] URL: [email or web address] Date: 10/24/2022 Prepared by: Benito Mccreedy   GOALS: Goals reviewed with patient? Yes   SHORT TERM GOALS: (STG required if POC>30 days) Target Date: 11/04/22  Pt will obtain protective, custom orthotic. Goal status: MET   2.  Pt will demo/state understanding of initial HEP to improve pain levels and prerequisite motion. Goal status: INITIAL   LONG TERM GOALS: Target Date: 12/09/22  Pt will improve functional ability by decreased impairment per Quick DASH assessment from 43% to 20% or better, for better quality of life. Goal status: INITIAL  2.  Pt will improve grip strength in Rt dom hand from painful 11lbs to at least 40lbs for functional use at home and in IADLs. Goal status: INITIAL  3.  Pt will improve A/ROM in Rt wrist flex/ext from 46/58 to at least  60* each, to have functional motion for tasks like reach and grasp.  Goal status: INITIAL  4.  Pt will improve coordination skills in Rt hand, as seen by [facility] score on 9 Hole Peg Test testing to have increased functional ability to carry out fine motor tasks (fasteners, etc.) and more complex, coordinated IADLs (meal prep, sports, etc.).  Goal status: TBD 9HPT in next session   6.  Pt will decrease pain at rest from 6/10 to 3/10 or better to have better sleep and occupational participation in daily roles. Goal status: INITIAL   ASSESSMENT:  CLINICAL IMPRESSION: Patient is a 77 y.o. male who was seen today for occupational therapy evaluation for bilateral thumb pain, right hand worse than left at this time.  This pain inhibits his fine motor skills and motion at the wrist as well as his daily routines and ADLs.  He will benefit from outpatient occupational therapy to increase quality of life and manage symptoms.   PERFORMANCE DEFICITS: in functional skills including ADLs, IADLs, coordination, dexterity, edema, ROM, strength, pain, fascial restrictions, muscle spasms, flexibility, Fine motor control, body mechanics, endurance, decreased knowledge of precautions, skin integrity, and UE functional use, cognitive skills including problem solving and safety awareness, and psychosocial skills including coping strategies, environmental adaptation, habits, and routines and behaviors.   IMPAIRMENTS: are limiting patient from ADLs, IADLs, rest and sleep, work, and leisure.   COMORBIDITIES: may have co-morbidities  that affects occupational performance. Patient will benefit from skilled OT to address above impairments and improve overall function.  MODIFICATION OR ASSISTANCE TO COMPLETE EVALUATION: No modification of tasks or assist necessary to complete an evaluation.  OT OCCUPATIONAL PROFILE AND HISTORY: Problem focused assessment: Including review of records relating to presenting problem.  CLINICAL  DECISION MAKING: LOW - limited treatment options, no task modification necessary  REHAB POTENTIAL: Excellent  EVALUATION COMPLEXITY: Low     PLAN:  OT FREQUENCY: 1x/week  OT DURATION: 6 weeks  PLANNED INTERVENTIONS: self care/ADL training, therapeutic exercise, therapeutic activity, neuromuscular re-education, manual therapy, passive range of motion, splinting, ultrasound, fluidotherapy, compression bandaging, moist heat, cryotherapy, contrast bath, patient/family education, energy conservation, coping strategies training, DME and/or AE instructions, and Dry needling  RECOMMENDED OTHER SERVICES: none now   CONSULTED AND AGREED WITH PLAN OF CARE: Patient  PLAN FOR NEXT SESSION: Check orthotic and initial home exercise program, finish home exercise program and ensure that his symptoms are starting to be managed.  Also consider dry needling for any active thumb spasms noted.  Benito Mccreedy, OTR/L, CHT 10/24/2022, 2:20 PM    OCCUPATIONAL THERAPY DISCHARGE SUMMARY  Visits from Start of Care: 0  Unfortunately he never returned to therapy after evaluation, cx f/u appointment, then no-showed the next 2 consecutive appointments without calling in, etc.  He was called but still didn't show.  We will discharge therapy now per practice protocols, but he could return with a new order if/when able, as needed.  He was called to inform him of this today, and OT left a VM for him.  (It seems ike he has been dealing with heart issues, and that has taken a priority for him.)   Benito Mccreedy, OTR/L, CHT 11/14/22

## 2022-10-24 NOTE — Progress Notes (Unsigned)
I, Justin [name], CMA acting as a scribe for Justin Leader, MD.  Justin [name] is a 77 y.o. male who presents to [place] at [facility] today for cont'd bilat thumb pain. Pt was last seen by Dr. Georgina Snell on 10/06/22 and was given a L 1st [place] steroid injection, was advised to use Voltaren gel, and was referred to OT for hand therapy, completing his first visit earlier today.  Today, patient reports continued right thumb pain. Wearing brace today. OT went well today, going 1-2 x weekly.   Dx imaging: 10/06/2022 R & L hand x-ray 06/09/17 R wrist XR   Pertinent review of systems: No fevers or chills  Relevant historical information: Atrial fibrillation   Exam:  BP (!) 160/86   Pulse 74   Ht '5\' 11"'$  (1.803 m)   Wt 172 lb 1.6 oz (78.1 kg)   SpO2 98%   BMI 24.00 kg/m  General: Well Developed, well nourished, and in no acute distress.   MSK: Right hand bossing and degeneration first [place].  Decreased thumb motion.  Tender palpation first [place].    Lab and Radiology Results  Procedure: Real-time Ultrasound Guided Injection of right first [facility] Device: Philips Affiniti 50G Images permanently stored and available for review in PACS Verbal informed consent obtained.  Discussed risks and benefits of procedure. Warned about infection, bleeding, hyperglycemia damage to structures among others. Patient expresses understanding and agreement Time-out conducted.   Noted no overlying erythema, induration, or other signs of local infection.   Skin prepped in a sterile fashion.   Local anesthesia: Topical Ethyl chloride.   With sterile technique and under real time ultrasound guidance: 20 mg of Kenalog and 0.5 mL of lidocaine injected into first CMC joint. Fluid seen entering the joint capsule.   Completed without difficulty   Pain immediately resolved suggesting accurate placement of the medication.   Advised to call if fevers/chills, erythema, induration, drainage, or persistent  bleeding.   Images permanently stored and available for review in the ultrasound unit.  Impression: Technically successful ultrasound guided injection.    EXAM: RIGHT HAND - COMPLETE 3+ VIEW   COMPARISON:  Right wrist radiographs 06/09/2017; report only from MRI right wrist 07/05/2017   FINDINGS: There is again extensive cystic change within the scaphoid. Additional likely degenerative cystic change within the adjacent proximal capitate and lunate. Bone-on-bone contact of the capitate and scaphoid and adjacent lateral aspect of the lunate.   Mild thumb carpometacarpal and interphalangeal joint space narrowing and peripheral osteophytosis. Moderate index finger DIP joint space narrowing with middle phalanx subchondral cystic change. Minimal medial subluxation of the index finger distal phalanx, likely degenerative.   No acute fracture or dislocation.   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 No definite acute fracture is seen.     Electronically Signed   By: Yvonne Kendall M.D.   On: 10/10/2022 08:50 I, Justin [name], personally (independently) visualized and performed the interpretation of the images attached in this note.      Assessment and Plan: 77 y.o. male with right hand pain thought to be due to DJD first [facility].  He is currently in the middle of hand therapy which is improving.  He had great results with the left first [place] injection and would like a similar injection to his right hand today.  Finish out hand therapy and check back with me as needed.   PDMP not reviewed this encounter. Orders  Placed This Encounter  Procedures   [place] LIMITED JOINT SPACE STRUCTURES UP BILAT(NO LINKED CHARGES)    Order Specific Question:   Reason for Exam (SYMPTOM  OR DIAGNOSIS REQUIRED)    Answer:   Bilateral thumb pain    Order Specific Question:   Preferred imaging location?     Answer:   [place]   No orders of the defined types were placed in this encounter.    Discussed warning signs or symptoms. Please see discharge instructions. Patient expresses understanding.   The above documentation has been reviewed and is accurate and complete Justin [name], M.D.

## 2022-10-24 NOTE — Patient Instructions (Addendum)
Thank you for coming in today.   You received an injection today. Seek immediate medical attention if the joint becomes red, extremely painful, or is oozing fluid.   Recheck as needed.   I can re do these shots every 3 months if we need to.   Ok to cancel that appointment with me in April

## 2022-10-25 ENCOUNTER — Other Ambulatory Visit: Payer: Self-pay | Admitting: Internal Medicine

## 2022-10-26 ENCOUNTER — Ambulatory Visit (HOSPITAL_COMMUNITY): Payer: Medicare Other | Admitting: Licensed Clinical Social Worker

## 2022-10-27 ENCOUNTER — Ambulatory Visit: Payer: Medicare Other | Admitting: Neurology

## 2022-10-27 ENCOUNTER — Encounter: Payer: Self-pay | Admitting: Neurology

## 2022-10-27 VITALS — BP 148/88 | HR 79 | Ht 71.0 in | Wt 171.2 lb

## 2022-10-27 DIAGNOSIS — G5791 Unspecified mononeuropathy of right lower limb: Secondary | ICD-10-CM

## 2022-10-27 DIAGNOSIS — M2141 Flat foot [pes planus] (acquired), right foot: Secondary | ICD-10-CM | POA: Diagnosis not present

## 2022-10-27 DIAGNOSIS — M79671 Pain in right foot: Secondary | ICD-10-CM | POA: Insufficient documentation

## 2022-10-27 MED ORDER — GABAPENTIN 300 MG PO CAPS: ORAL_CAPSULE | ORAL | 11 refills | Status: DC

## 2022-10-27 NOTE — Patient Instructions (Addendum)
Gabapentin Capsules or Tablets What is this medication? GABAPENTIN (GA ba pen tin) treats nerve pain. Neuropathic Pain Neuropathic pain is pain caused by damage to the nerves that are responsible for certain sensations in your body (sensory nerves). Neuropathic pain can make you more sensitive to pain. Even a minor sensation can feel very painful. This is usually a long-term (chronic) condition that can be difficult to treat. The type of pain differs from person to person. It may: Start suddenly (acute), or it may develop slowly and become chronic. Come and go as damaged nerves heal, or it may stay at the same level for years. Cause emotional distress, loss of sleep, and a lower quality of life. What are the causes? The most common cause of this condition is diabetes. Many other diseases and conditions can also cause neuropathic pain. Causes of neuropathic pain can be classified as: Toxic. This is caused by medicines and chemicals. The most common causes of toxic neuropathic pain is damage from medicines that kill cancer cells (chemotherapy) or alcohol abuse. Metabolic. This can be caused by: Diabetes. Lack of vitamins like B12. Traumatic. Any injury that cuts, crushes, or stretches a nerve can cause damage and pain. Compression-related. If a sensory nerve gets trapped or compressed for a long period of time, the blood supply to the nerve can be cut off. Vascular. Many blood vessel diseases can cause neuropathic pain by decreasing blood supply and oxygen to nerves. Autoimmune. This type of pain results from diseases in which the body's defense system (immune system) mistakenly attacks sensory nerves. Examples of autoimmune diseases that can cause neuropathic pain include lupus and multiple sclerosis. Infectious. Many types of viral infections can damage sensory nerves and cause pain. Shingles infection is a common cause of this type of pain. Inherited. Neuropathic pain can be a symptom of many  diseases that are passed down through families (genetic). What increases the risk? You are more likely to develop this condition if: You have diabetes. You smoke. You drink too much alcohol. You are taking certain medicines, including chemotherapy or medicines that treat immune system disorders. What are the signs or symptoms? The main symptom is pain. Neuropathic pain is often described as: Burning. Shock-like. Stinging. Hot or cold. Itching. How is this diagnosed? No single test can diagnose neuropathic pain. It is diagnosed based on: A physical exam and your symptoms. Your health care provider will ask you about your pain. You may be asked to use a pain scale to describe how bad your pain is. Tests. These may be done to see if you have a cause and location of any nerve damage. They include: Nerve conduction studies and electromyography to test how well nerve signals travel through your nerves and muscles (electrodiagnostic testing). Skin biopsy to evaluate for small fiber neuropathy. Imaging studies, such as: X-rays. CT scan. MRI. How is this treated? Treatment for neuropathic pain may change over time. You may need to try different treatment options or a combination of treatments. Some options include: Treating the underlying cause of the neuropathy, such as diabetes, kidney disease, or vitamin deficiencies. Stopping medicines that can cause neuropathy, such as chemotherapy. Medicine to relieve pain. Medicines may include: Prescription or over-the-counter pain medicine. Anti-seizure medicine. Antidepressant medicines. Pain-relieving patches or creams that are applied to painful areas of skin. A medicine to numb the area (local anesthetic), which can be injected as a nerve block. Transcutaneous nerve stimulation. This uses electrical currents to block painful nerve signals. The treatment is painless.  Alternative treatments, such  as: Acupuncture. Meditation. Massage. Occupational or physical therapy. Pain management programs. Counseling. Follow these instructions at home: Medicines  Take over-the-counter and prescription medicines only as told by your health care provider. Ask your health care provider if the medicine prescribed to you: Requires you to avoid driving or using machinery. Can cause constipation. You may need to take these actions to prevent or treat constipation: Drink enough fluid to keep your urine pale yellow. Take over-the-counter or prescription medicines. Eat foods that are high in fiber, such as beans, whole grains, and fresh fruits and vegetables. Limit foods that are high in fat and processed sugars, such as fried or sweet foods. Lifestyle  Have a good support system at home. Consider joining a chronic pain support group. Do not use any products that contain nicotine or tobacco. These products include cigarettes, chewing tobacco, and vaping devices, such as e-cigarettes. If you need help quitting, ask your health care provider. Do not drink alcohol. General instructions Learn as much as you can about your condition. Work closely with all your health care providers to find the treatment plan that works best for you. Ask your health care provider what activities are safe for you. Keep all follow-up visits. This is important. Contact a health care provider if: Your pain treatments are not working. You are having side effects from your medicines. You are struggling with tiredness (fatigue), mood changes, depression, or anxiety. Get help right away if: You have thoughts of hurting yourself. Get help right away if you feel like you may hurt yourself or others, or have thoughts about taking your own life. Go to your nearest emergency room or: Call 911. Call the [place] at [phone] or 988. This is open 24 hours a day. Text the Crisis Text Line at  229-688-4883. Summary Neuropathic pain is pain caused by damage to the nerves that are responsible for certain sensations in your body (sensory nerves). Neuropathic pain may come and go as damaged nerves heal, or it may stay at the same level for years. Neuropathic pain is usually a long-term condition that can be difficult to treat. Consider joining a chronic pain support group. This information is not intended to replace advice given to you by your health care provider. Make sure you discuss any questions you have with your health care provider. Document Revised: 04/05/2021 Document Reviewed: 04/05/2021 Elsevier Patient Education  [place].  It works by calming overactive nerves in your body. This medicine may be used for other purposes; ask your health care provider or pharmacist if you have questions. COMMON BRAND NAME(S): Active-PAC with Gabapentin, Orpha Bur, Gralise, Neurontin What should I tell my care team before I take this medication? They need to know if you have any of these conditions: Alcohol or substance use disorder Kidney disease Lung or breathing disease Suicidal thoughts, plans, or attempt; a previous suicide attempt by you or a family member An unusual or allergic reaction to gabapentin, other medications, foods, dyes, or preservatives Pregnant or trying to get pregnant Breast-feeding How should I use this medication? Take this medication by mouth with a glass of water. Follow the directions on the prescription label. You can take it with or without food. If it upsets your stomach, take it with food. Take your medication at regular intervals. Do not take it more often than directed. Do not stop taking except on your care team's advice. If you are directed to break the 600 or 800 mg  tablets in half as part of your dose, the extra half tablet should be used for the next dose. If you have not used the extra half tablet within 28 days, it should be thrown away. A special  MedGuide will be given to you by the pharmacist with each prescription and refill. Be sure to read this information carefully each time. Talk to your care team about the use of this medication in children. While this medication may be prescribed for children as young as 3 years for selected conditions, precautions do apply. Overdosage: If you think you have taken too much of this medicine contact a poison control center or emergency room at once. NOTE: This medicine is only for you. Do not share this medicine with others. What if I miss a dose? If you miss a dose, take it as soon as you can. If it is almost time for your next dose, take only that dose. Do not take double or extra doses. What may interact with this medication? Alcohol Antihistamines for allergy, cough, and cold Certain medications for anxiety or sleep Certain medications for depression like amitriptyline, fluoxetine, sertraline Certain medications for seizures like phenobarbital, primidone Certain medications for stomach problems General anesthetics like halothane, isoflurane, methoxyflurane, propofol Local anesthetics like lidocaine, pramoxine, tetracaine Medications that relax muscles for surgery Opioid medications for pain Phenothiazines like chlorpromazine, mesoridazine, prochlorperazine, thioridazine This list may not describe all possible interactions. Give your health care provider a list of all the medicines, herbs, non-prescription drugs, or dietary supplements you use. Also tell them if you smoke, drink alcohol, or use illegal drugs. Some items may interact with your medicine. What should I watch for while using this medication? Visit your care team for regular checks on your progress. You may want to keep a record at home of how you feel your condition is responding to treatment. You may want to share this information with your care team at each visit. You should contact your care team if your seizures get worse or if  you have any new types of seizures. Do not stop taking this medication or any of your seizure medications unless instructed by your care team. Stopping your medication suddenly can increase your seizures or their severity. This medication may cause serious skin reactions. They can happen weeks to months after starting the medication. Contact your care team right away if you notice fevers or flu-like symptoms with a rash. The rash may be red or purple and then turn into blisters or peeling of the skin. Or, you might notice a red rash with swelling of the face, lips or lymph nodes in your neck or under your arms. Wear a medical identification bracelet or chain if you are taking this medication for seizures. Carry a card that lists all your medications. This medication may affect your coordination, reaction time, or judgment. Do not drive or operate machinery until you know how this medication affects you. Sit up or stand slowly to reduce the risk of dizzy or fainting spells. Drinking alcohol with this medication can increase the risk of these side effects. Your mouth may get dry. Chewing sugarless gum or sucking hard candy, and drinking plenty of water may help. Watch for new or worsening thoughts of suicide or depression. This includes sudden changes in mood, behaviors, or thoughts. These changes can happen at any time but are more common in the beginning of treatment or after a change in dose. Call your care team right away if you experience  these thoughts or worsening depression. If you become pregnant while using this medication, you may enroll in the [place] Pregnancy Registry by calling [phone]. This registry collects information about the safety of antiepileptic medication use during pregnancy. What side effects may I notice from receiving this medication? Side effects that you should report to your care team as soon as possible: Allergic reactions or angioedema--skin  rash, itching, hives, swelling of the face, eyes, lips, tongue, arms, or legs, trouble swallowing or breathing Rash, fever, and swollen lymph nodes Thoughts of suicide or self harm, worsening mood, feelings of depression Trouble breathing Unusual changes in mood or behavior in children after use such as difficulty concentrating, hostility, or restlessness Side effects that usually do not require medical attention (report to your care team if they continue or are bothersome): Dizziness Drowsiness Nausea Swelling of ankles, feet, or hands Vomiting This list may not describe all possible side effects. Call your doctor for medical advice about side effects. You may report side effects to FDA at 1-800-FDA-1088. Where should I keep my medication? Keep out of reach of children and pets. Store at room temperature between 15 and 30 degrees C (59 and 86 degrees F). Get rid of any unused medication after the expiration date. This medication may cause accidental overdose and death if taken by other adults, children, or pets. To get rid of medications that are no longer needed or have expired: Take the medication to a medication take-back program. Check with your pharmacy or law enforcement to find a location. If you cannot return the medication, check the label or package insert to see if the medication should be thrown out in the garbage or flushed down the toilet. If you are not sure, ask your care team. If it is safe to put it in the trash, empty the medication out of the container. Mix the medication with cat litter, dirt, coffee grounds, or other unwanted substance. Seal the mixture in a bag or container. Put it in the trash. NOTE: This sheet is a summary. It may not cover all possible information. If you have questions about this medicine, talk to your doctor, pharmacist, or health care provider.  2023 Elsevier/Gold Standard (2021-02-08 00:00:00)   Start with 300 mg gabapentin at night po and  slowly increase to 300 mg times 2 at night, and one dose of 300 mg in daytime.

## 2022-10-27 NOTE — Progress Notes (Addendum)
Provider:  Larey Seat, MD  Primary Care Physician:  Biagio Borg, MD [place] [address]     Referring Provider: Wylene Simmer, [address] [place],  [place] [id number]          Chief Complaint according to patient   Patient presents with:     New Patient (Initial Visit)           HISTORY OF PRESENT ILLNESS:   Chief concern according to patient :  "Foot pain after extensive surgery "   Justin [name] is a 77 y.o. male patient who is here for a new problem , visit 10/27/2022 for neuropathy, foot pain. He had foot surgery, after an Justin revealed a " turned tendon" . Steroid shots did not help the pain. He has had surgery, has felt since as if a vice is crushing his midfoot on the right side,  had some neuropathy before on the right side? He denies this. His reports is contradictory as he stated he had tingling numbness and sometimes pain from above his ankles into his feet ( plantar, lateral aspect by his description) before the surgery, but apparently his right foot pain has been much more intense.   His regular orthopedist was Dr. Meryl Dare, and Dr. Lucia Gaskins was his foot surgeon in 06-07-2021.  He was send to Dr Murvin Natal for EMG and NCS in January 2024 at [facility]; who discussed the test result with the patient: "I explained that repair of the posterior tibial tendon is not a treatment option and that tendon transfer and calcaneus osteotomy are appropriate for posterior tibial tendon tear and that he is surgical outcome appears appropriate with restoration of his arch. This was the only description of the patient's pre operative condition and surgical procedure available to me - the patient was inexact in his description and could not recall the diagnosis except for a flat foot.    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  The  same is stated  for the right tibial nerve axonal motor and peripheral neuropathy.  This patient should have a sensory neuropathy given how much pain he feels, He may have small fiber neuropathy, of course.  He has absent sural nerve responses.  He has no sign of compartment syndrome and no Reflex sympathetic dystrophy sign to skin, with his toes having normal capillary refill. No edema.  The diagnosis was hereditary and idiopathic neuropathy.  But a hereditary and idiopathic neuropathy would not affect one foot only. He also does not have asymmetric muscle mass loss, so a motor neuropathy with onset in 2022 is really not evident.  He can wiggle his toes but his foot is still flat and the dorsum of his foot is the most painful area while he describes a numbness and sometimes a tingling or burning at the bottom of the feet. He described a tight vice feeling around his foot.   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   Absent right sural sensory nerve conduction study; likely secondary to advanced sural peripheral neuropathy.   No electrodiagnostic evidence of a common peroneal motor entrapment neuropathy at the level of the right fibular head.   No electrodiagnostic evidence of a lumbosacral radiculopathy  in the right lower extremity.   No electrodiagnostic evidence of a myopathy in the right lower extremity. [id number]     I had seen Justin [name] once before in 2021 for amnestic spells- with no neurologic pathology.   Justin [name] reports he lost memory 7 years ago transiently  in the context of his wife 's passing after a long illness. 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 He has atrial fib in the past, sees Dr Quentin Ore  The patient reports that Cardioversions failed 5 times (?)  . Reports that an Ablation was done  , he has follow up soon.      Review of Systems: Out of a complete 14 system review, the patient complains of only the following symptoms, and all other reviewed systems are negative.:     Foot pain. Bipolar depression Cardiac palpitation     Social History   Socioeconomic History   Marital status: Widowed    Spouse name: Not on file   Number of children: 2   Years of education: 12   Highest education level: Not on file  Occupational History   Occupation: Retired  Tobacco Use   Smoking status: Never   Smokeless tobacco: Never   Tobacco comments:    Never smoke 06/14/22  Vaping Use   Vaping Use: Never used  Substance and Sexual Activity   Alcohol use: Not Currently    Alcohol/week: 6.0 standard drinks of alcohol    Types: 6 Cans of beer per week    Comment: 3 beers in a sitting   Drug use: No   Sexual activity: Not Currently  Other Topics Concern   Not on file  Social History Narrative   Retired, widowed in 2017    1 son / 1 daughter   2 caffeinated beverages daily no alcohol or tobacco   Fun: Work out in the yard.   Denies religious beliefs effecting healthcare.    Worked at [employer] for 30 years   Social Determinants of [employer]: Low Risk  (12/23/2021)   Overall Financial Resource Strain (CARDIA)    Difficulty of Paying Living Expenses: Not hard at all  Food Insecurity: No Food Insecurity (12/23/2021)   Hunger Vital Sign    Worried About Running Out of Food in the Last Year: Never true    [place] in the Last Year: Never true  Transportation Needs: No Transportation Needs (12/23/2021)   PRAPARE - Hydrologist (Medical): No    Lack of Transportation (Non-Medical): No  Physical Activity: Inactive (12/23/2021)   Exercise Vital Sign    Days of Exercise per Week: 0 days    Minutes of Exercise per Session: 0 min  Stress: Stress Concern Present (12/23/2021)   [place]    Feeling of Stress : To some extent  Social Connections: Socially Isolated (12/23/2021)   Social Connection and Isolation Panel [NHANES]    Frequency of Communication with Friends and Family: More than three times a week    Frequency of Social Gatherings with Friends and Family: Once a week    Attends Religious Services: Never    Marine scientist or Organizations: No    Attends Archivist Meetings: Never    Marital Status: Widowed    Family History  Problem Relation Age of Onset   Arthritis Mother    Heart disease  Mother        s/p CABG   Hypertension Mother    Alzheimer's disease Mother    Heart attack Mother    Arthritis Father    Hyperlipidemia Father    Heart disease Father        s/p CABG   Stroke Father    Hypertension Father    Heart attack Father    Multiple sclerosis Sister    Diabetes Sister    Lung cancer Paternal Grandmother    Diabetes Sister    Heart attack Sister    Pulmonary embolism Sister        died   Lung cancer Maternal Aunt    Lung cancer Paternal Aunt    Depression Brother        suicide    Past Medical History:  Diagnosis Date   Anxiety and depression    Aortic insufficiency 03/11/2022   Echocardiogram 02/2022: EF 70-75, no RWMA, moderate LVH, normal RVSF, normal PASP (RVSP 20), moderate LAE, mild Justin, moderate AI, AV sclerosis without stenosis, no aortic root or ascending aorta dilation   Arthritis    knees, c-spine // gets lumbar ESI q 6 mos   Bipolar disorder ([place])    CAD (coronary artery disease)    [place] 08/2009 >> LHC ([facility]) - mLAD 70, Dx 65 >> PCI:  3x15 mm Endeavor DES to mLAD and 2.5x12 mm Endeavor DES to Dx // S/p NSTEMI >> LHC 8/12 ([facility]) - LM ok, LAD prox and mid 40; LAD stent ok, Dx stent ok, mRCA 30, mLCx 50 >> med Rx // ETT-Echo 2/17 ([facility]): Normal, EF 55-60 at rest   Chicken pox    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[place])    Grief    History of echocardiogram    Echo 3/19:  Mild concentric LVH, EF 60-65, normal wall motion, grade 1 diastolic dysfunction, mild AI, mildly dilated aortic root (40 mm), MAC, mild LAE, atrial septal lipomatous hypertrophy // Echo 8/22: EF 55-60, no RWMA, mild LVH, GRII DD, normal RVSF, RVSP 40, mild LAE, trivial Justin, mild-moderate AI, AV sclerosis without stenosis, mild dilation of aortic root (39 mm)   History of non-ST elevation myocardial infarction (NSTEMI)    History of nuclear stress test    Nuclear stress test 3/19: EF 57, inferior/inferoseptal/inferolateral defect consistent with probable soft tissue attenuation (cannot exclude subendocardial scar), no ischemia, intermediate risk >> Echo 3/19 normal EF, normal wall motion   History of pneumonia 2011   History of prostatitis 1990s   Hx of adenomatous colonic polyps 06/22/2016   Hyperlipidemia    Hypertension    Migraines    prior history   PTSD (post-traumatic stress disorder)     Past Surgical History:  Procedure Laterality Date   ATRIAL FIBRILLATION ABLATION N/A 05/17/2022   Procedure: ATRIAL FIBRILLATION ABLATION;  Surgeon: Vickie Epley, MD;  Location: [place] [facility];  Service: Cardiovascular;  Laterality: N/A;   CARDIOVERSION N/A 08/05/2021   Procedure: CARDIOVERSION;  Surgeon: Acie Fredrickson Wonda Cheng, MD;  Location: [facility] ENDOSCOPY;  Service: Cardiovascular;  Laterality: N/A;   CARDIOVERSION N/A 07/28/2022   Procedure: CARDIOVERSION;  Surgeon: [name], MD;  Location: [facility] ENDOSCOPY;  Service: Cardiovascular;  Laterality: N/A;   CARDIOVERSION N/A 09/23/2022   Procedure: CARDIOVERSION;  Surgeon: Elouise Munroe, MD;  Location: [facility] ENDOSCOPY;  Service: Cardiovascular;  Laterality: N/A;   CERVICAL DISCECTOMY     COLONOSCOPY  03/2017   ELBOW SURGERY Left    ELBOW SURGERY Right  INGUINAL HERNIA REPAIR Bilateral    1996, 1997   KNEE ARTHROSCOPY Right    KNEE CARTILAGE SURGERY Left    NASAL SEPTUM SURGERY     STENT PLACEMENT VASCULAR ([place] HX)  09/02/2010    TONSILLECTOMY     TOTAL KNEE ARTHROPLASTY Left 08/31/2018   Procedure: TOTAL KNEE ARTHROPLASTY;  Surgeon: Dorna Leitz, MD;  Location: [facility] ORS;  Service: Orthopedics;  Laterality: Left;     Current Outpatient Medications on File Prior to Visit  Medication Sig Dispense Refill   ALPRAZolam (XANAX) 1 MG tablet TAKE 1/2 TO 1 TABLET BY MOUTH ONCE DAILY 30 tablet 0   diltiazem (CARDIZEM CD) 180 MG 24 hr capsule TAKE 1 CAPSULE BY MOUTH DAILY 30 capsule 11   isosorbide mononitrate (IMDUR) 30 MG 24 hr tablet TAKE 1 TABLET BY MOUTH DAILY 30 tablet 6   lamoTRIgine (LAMICTAL) 25 MG tablet Take one tablet (25 mg) every night at bedtime. 30 tablet 2   nitroGLYCERIN (NITROSTAT) 0.4 MG SL tablet Place 1 tablet (0.4 mg total) under the tongue every 5 (five) minutes as needed for chest pain. 25 tablet 3   rivaroxaban (XARELTO) 20 MG TABS tablet Take 1 tablet (20 mg total) by mouth daily. 30 tablet 11   rosuvastatin (CRESTOR) 20 MG tablet TAKE 1 TABLET BY MOUTH DAILY 30 tablet 6   DULoxetine (CYMBALTA) 30 MG capsule Take 1 capsule (30 mg total) by mouth daily. (Patient not taking: Reported on 10/27/2022) 30 capsule 2   lamoTRIgine (LAMICTAL) 200 MG tablet Take 1 tablet (200 mg total) by mouth daily. For mood control (Patient not taking: Reported on 10/27/2022) 30 tablet 2   No current facility-administered medications on file prior to visit.    Allergies  Allergen Reactions   Ambien [Zolpidem Tartrate] Other (See Comments)    Blackout, memory issues   Codeine     Causes Memory issues    Amiodarone     dizziness   Ativan [Lorazepam] Other (See Comments)    Made him feel completely out of it fell down   Amitriptyline Palpitations   Seroquel [Quetiapine]     SEVERE NIGHTMARES, SLEEPWALK AND NIGHT DRIVE WITH NO RECOLLECTION UPON WAKENING     DIAGNOSTIC DATA (LABS, IMAGING, TESTING) - I reviewed patient records, labs, notes, testing and imaging myself where available.  Lab Results  Component Value Date    WBC 5.4 09/19/2022   HGB 13.1 09/19/2022   HCT 37.9 09/19/2022   MCV 97 09/19/2022   PLT 157 09/19/2022      Component Value Date/Time   NA 133 (L) 09/19/2022 0830   K 4.7 09/19/2022 0830   CL 99 09/19/2022 0830   CO2 26 09/19/2022 0830   GLUCOSE 102 (H) 09/19/2022 0830   GLUCOSE 103 (H) 07/28/2022 1042   BUN 15 09/19/2022 0830   CREATININE 0.89 09/19/2022 0830   CALCIUM 9.0 09/19/2022 0830   PROT 6.8 10/12/2021 1527   PROT 6.4 10/15/2019 1543   ALBUMIN 4.7 10/12/2021 1527   AST 25 10/12/2021 1527   ALT 19 10/12/2021 1527   ALKPHOS 66 10/12/2021 1527   BILITOT 1.0 10/12/2021 1527   GFRNONAA >60 07/28/2022 1042   GFRAA >60 08/27/2019 2039   Lab Results  Component Value Date   CHOL 137 10/12/2021   HDL 77.80 10/12/2021   LDLCALC 50 10/12/2021   TRIG 44.0 10/12/2021   CHOLHDL 2 10/12/2021   Lab Results  Component Value Date   HGBA1C 4.6 10/12/2021   Lab Results  Component Value Date   VITAMINB12 304 10/12/2021   Lab Results  Component Value Date   TSH 0.63 10/12/2021    PHYSICAL EXAM:  Today's Vitals   10/27/22 1400  BP: (!) 148/88  Pulse: 79  Weight: 171 lb 3.2 oz (77.7 kg)  Height: '5\' 11"'$  (1.803 m)   Body mass index is 23.88 kg/m.   Wt Readings from Last 3 Encounters:  10/27/22 171 lb 3.2 oz (77.7 kg)  10/24/22 172 lb 1.6 oz (78.1 kg)  10/06/22 172 lb 3.2 oz (78.1 kg)     Ht Readings from Last 3 Encounters:  10/27/22 '5\' 11"'$  (1.803 m)  10/24/22 '5\' 11"'$  (1.803 m)  10/06/22 '5\' 11"'$  (1.803 m)      General: The patient is awake, alert and appears not in acute distress. The patient is well groomed. Head: Normocephalic, atraumatic. Neck is supple.  Respiratory: Lungs are clear to auscultation.  Skin:  Without evidence of ankle edema, or rash. He has normal capillary refill in all toes. His feet were warm and pink, not pale , no skin dystrophy was noted. . Trunk: The patient's posture is erect.   NEUROLOGIC EXAM: The patient is awake and alert,  oriented to place and time.   Speech is fluent,  without  dysarthria, dysphonia or aphasia.  Mood and affect are appropriate.   Cranial nerves: no loss of smell or taste reported  Pupils are equal and briskly reactive to light. Funduscopic exam deferred. .  Extraocular movements in vertical and horizontal planes were intact and without nystagmus. Visual fields by finger perimetry are intact.Hearing was impaired mildly  Facial sensation intact to fine touch.  Facial motor strength is symmetric , tongue and uvula move midline.  Neck ROM : rotation, tilt and flexion extension were normal for age and shoulder shrug was symmetrical.    Motor exam:  Symmetric bulk, tone and ROM.   Normal tone without cog wheeling.   Sensory:  Fine touch, pinprick and vibration were absent at the right foot, ankle and toes.  Proprioception tested in the upper extremities was normal. Toe propioception is lost in right foot only.    Deep tendon reflexes: in the lower extremities are symmetric and intact. Patella reflex preserved.  Babinski response was down-going.  ASSESSMENT AND PLAN 77 y.o. year old Caucasian widowed male presented here  with: Poorly explained foot pain of about 15 month duration -by timeframe this would be highly suspect for surgical nerve damage , affecting the operated right foot only . The posterior Tibial Tendon surgery  ,"flat foot operation" ,was performed through an incision running from the inside ankle to the arch of the foot. It was combined with an osteotomy (cut) of the Os Calcis (heel bone) to reinforce the deformity correction. The osteotomy was performed through an incision on the outside of the heel and fixed with a screw.   1) poorly explained foot pain severe- and on top of a preexisting peripheral neuropathy. The feeling of a tight vice compressing and crushing his left foot at the forefoot - Charcot line- remains unexplained .  This part fits clinically a sensory nerve damage  and not an hereditary Neuropathy ( not bilaterally present) .  His motor function was not much affected.  No visible muscle atrophy, no foot and ankle skin changes consisted with RLS or nerve entrapment symptoms are seen, and well healed scars were noted. No sign of infection or delayed wound healing reported.   Loss of proprioception in his  right foot toes- and pain radiates between hallux and next toe,from the back calf, but there is no radiating back pain.   He initially endorsed a feeling of Numbness prior to surgery in both feet but then clarified:  reported he never before surgery had any severe pain- only some tingling sensations or feeling that the foot had fallen "asleep".  I hope to find a pain management     PLan: 1) My first step is to attempt to relief pain: Plan is to start Gabapentin  (and plan B would be Lyrica), currently ordered Gabapentin 300 mg up to tid. He was warned about sedating side effect. His EMG physician had recommended and may have ordered amitriptyline. His med list did not show any amitriptyline as active medication and he cannot recall a medication by that name. Cymbalta is listed, he feels no relief from this drug. Lamictal is used for mood stabilization. A previous steroid injection of the foot reportedly did not help.   2) any reason to Repeat yet another NCV and EMG here ?- we have not much capacity but I asked my Colleagues Dr Krista Blue and dr Jaynee Eagles to see him once more for NCV and EMG and their input. Tarsal tunnel ?   3) If the patient remains in unexplained pain, He would need to be referred back for pain management with his surgeon or his PCP.     The general neurology clinic will follow  within the next 4- 6 months after input from neuromuscular specialist colleagues.   I would like to thank Biagio Borg, MD and Wylene Simmer, [address],  [place] [id number] for allowing me to meet with and to take care of this pleasant patient.    CC: I will share my notes with PCP and Dr Doran Durand.  After spending a total time of  35  minutes face to face and additional time for physical and neurologic examination, review of laboratory studies,  personal review of imaging studies, reports and results of other testing and review of referral information / records as far as provided in visit,   Electronically signed by: Larey Seat, MD 10/27/2022 2:29 PM  [facility] Board certified by The [employer] of Sleep Medicine and Diplomate of the [employer] of Sleep Medicine. Board certified In Neurology through the [employer], Fellow of the [employer] of Neurology. Medical Director of [employer] [employer].

## 2022-10-29 MED ORDER — GABAPENTIN 300 MG PO CAPS: ORAL_CAPSULE | ORAL | 11 refills | Status: DC

## 2022-10-29 NOTE — Addendum Note (Signed)
Addended by: Larey Seat on: 10/29/2022 04:11 PM   Modules accepted: Orders

## 2022-10-31 ENCOUNTER — Encounter: Payer: Medicare Other | Admitting: Rehabilitative and Restorative Service Providers"

## 2022-10-31 NOTE — Progress Notes (Unsigned)
Electrophysiology Office Follow up Visit Note:    Date:  11/01/2022   ID:  Justin [name], DOB [birth year], MRN [id number]  PCP:  Biagio Borg, MD  [place] Cardiologist:  Sherren Mocha, MD  [facility] Electrophysiologist:  Vickie Epley, MD    Interval History:    Justin [name] is a 77 y.o. male who presents for a follow up visit.   Hx of persistent AF s/p PVI 05/17/2022. DCCV on 07/28/2022 and 09/23/2022.  Today he tells me he is maintaining normal rhythm after his cardioversion in February.  He says every once a while he will feel a few skipped beats but no sustained arrhythmias.  He is taking his Xarelto reliably.  He says his biggest concern is shortness of breath that comes and goes.  Some days he is able to walk his dog for 45 minutes to an hour without much difficulty and other days he is fairly short of breath with minimal exertion.  He is not sure if it is related to an arrhythmia.  No swelling.  He lays flat.  He has a poor appetite.  No nausea.      Past medical, surgical, social and family history were reviewed.  ROS:   Please see the history of present illness.    All other systems reviewed and are negative.  EKGs/Labs/Other Studies Reviewed:    The following studies were reviewed today:    EKG:  The ekg ordered today demonstrates sinus rhythm.  Incomplete right bundle.  Left anterior fascicular block.   Physical Exam:    VS:  BP 138/76   Pulse 75   Ht '5\' 11"'$  (1.803 m)   Wt 170 lb 6.4 oz (77.3 kg)   SpO2 99%   BMI 23.77 kg/m     Wt Readings from Last 3 Encounters:  11/01/22 170 lb 6.4 oz (77.3 kg)  10/27/22 171 lb 3.2 oz (77.7 kg)  10/24/22 172 lb 1.6 oz (78.1 kg)     GEN:  Well nourished, well developed in no acute distress CARDIAC: RRR, no murmurs, rubs, gallops RESPIRATORY:  Clear to auscultation without rales, wheezing or rhonchi       ASSESSMENT:    1. Persistent atrial fibrillation ([place])   2. Primary hypertension     PLAN:    In order of problems listed above:   #Persistent AF S/p PVI 05/17/2022 and DCCV 07/28/2022 and 09/23/2022. Cont xarelto We discussed treatment options for his recurrent atrial fibrillation during today's clinic appointment including continued conservative management, addition of an antiarrhythmic drug versus redo catheter ablation.  I will plan to touch base with him after wearing a 2-week ZIO monitor.  If we are able to link his symptoms to atrial fibrillation episodes, would favor improved rhythm control using catheter ablation.  #Shortness of breath Unclear cause.  Could be recurrent atrial fibrillation episodes.  I would like to exclude worsening LV function with an echocardiogram.  He had hypertrophy of the basal septum on the previous echo.  It is possible that he is having obstructive symptoms.  I would like this evaluated on the echo.  We will make a note of this on his echo order.  #Hypertension At goal today.  Recommend checking blood pressures 1-2 times per week at home and recording the values.  Recommend bringing these recordings to the primary care physician.     Follow-up in 6 to 8 weeks with me      Signed, Lars Mage, MD, [facility],  [facility] 11/01/2022 11:13 AM    Electrophysiology [facility]

## 2022-11-01 ENCOUNTER — Ambulatory Visit (INDEPENDENT_AMBULATORY_CARE_PROVIDER_SITE_OTHER): Payer: Medicare Other

## 2022-11-01 ENCOUNTER — Ambulatory Visit: Payer: Medicare Other | Attending: Cardiology | Admitting: Cardiology

## 2022-11-01 ENCOUNTER — Encounter: Payer: Self-pay | Admitting: Cardiology

## 2022-11-01 VITALS — BP 138/76 | HR 75 | Ht 71.0 in | Wt 170.4 lb

## 2022-11-01 DIAGNOSIS — I10 Essential (primary) hypertension: Secondary | ICD-10-CM

## 2022-11-01 DIAGNOSIS — I4819 Other persistent atrial fibrillation: Secondary | ICD-10-CM

## 2022-11-01 NOTE — Patient Instructions (Addendum)
Medication Instructions:  Your physician recommends that you continue on your current medications as directed. Please refer to the Current Medication list given to you today. *If you need a refill on your cardiac medications before your next appointment, please call your pharmacy*   Testing/Procedures: Echocardiogram Your physician has requested that you have an echocardiogram. Echocardiography is a painless test that uses sound waves to create images of your heart. It provides your doctor with information about the size and shape of your heart and how well your heart's chambers and valves are working. This procedure takes approximately one hour. There are no restrictions for this procedure. Please do NOT wear cologne, perfume, aftershave, or lotions (deodorant is allowed). Please arrive 15 minutes prior to your appointment time.   Your physician has recommended that you wear an event monitor. Event monitors are medical devices that record the heart's electrical activity. Doctors most often [place] these monitors to diagnose arrhythmias. Arrhythmias are problems with the speed or rhythm of the heartbeat. The monitor is a small, portable device. You can wear one while you do your normal daily activities. This is usually used to diagnose what is causing palpitations/syncope (passing out).   Follow-Up: At [facility], you and your health needs are our priority.  As part of our continuing mission to provide you with exceptional heart care, we have created designated Provider Care Teams.  These Care Teams include your primary Cardiologist (physician) and Advanced Practice Providers (APPs -  Physician Assistants and Nurse Practitioners) who all work together to provide you with the care you need, when you need it.   Your next appointment:   6-8 week(s)  Provider:    Vickie Epley, MD  Other [place]- Long Term Monitor Instructions Your physician has requested you wear a ZIO patch monitor  for 14 days.  This is a single patch monitor. [employer] supplies one patch monitor per enrollment. Additional stickers are not available. Please do not apply patch if you will be having a Nuclear Stress Test,  Echocardiogram, Cardiac CT, MRI, or Chest Xray during the period you would be wearing the  monitor. The patch cannot be worn during these tests. You cannot remove and re-apply the  ZIO XT patch monitor.  Your ZIO patch monitor will be mailed 3 day USPS to your address on file. It may take 3-5 days  to receive your monitor after you have been enrolled.  Once you have received your monitor, please review the enclosed instructions. Your monitor  has already been registered assigning a specific monitor serial # to you.  Billing and Patient Assistance Program Information  We have supplied [employer] with any of your insurance information on file for billing purposes. [employer] offers a sliding scale Patient Assistance Program for patients that do not have  insurance, or whose insurance does not completely cover the cost of the ZIO monitor.  You must apply for the Patient Assistance Program to qualify for this discounted rate.  To apply, please call [employer] at [phone], select option 4, select option 2, ask to apply for  Patient Assistance Program. Theodore Demark will ask your household income, and how many people  are in your household. They will quote your out-of-pocket cost based on that information.  [employer] will also be able to set up a 2-month interest-free payment plan if needed.  Applying the monitor   Shave hair from upper left chest.  Hold abrader disc by orange tab. Rub abrader in 40 strokes over the upper  left chest as  indicated in your monitor instructions.  Clean area with 4 enclosed alcohol pads. Let dry.  Apply patch as indicated in monitor instructions. Patch will be placed under collarbone on left  side of chest with arrow pointing upward.  Rub patch adhesive wings for 2  minutes. Remove white label marked "1". Remove the white  label marked "2". Rub patch adhesive wings for 2 additional minutes.  While looking in a mirror, press and release button in center of patch. A small green light will  flash 3-4 times. This will be your only indicator that the monitor has been turned on.  Do not shower for the first 24 hours. You may shower after the first 24 hours.  Press the button if you feel a symptom. You will hear a small click. Record Date, Time and  Symptom in the Patient Logbook.  When you are ready to remove the patch, follow instructions on the last 2 pages of Patient  Logbook. Stick patch monitor onto the last page of Patient Logbook.  Place Patient Logbook in the blue and white box. Use locking tab on box and tape box closed  securely. The blue and white box has prepaid postage on it. Please place it in the mailbox as  soon as possible. Your physician should have your test results approximately 7 days after the  monitor has been mailed back to [facility].  Call [place] at [phone] if you have questions regarding  your ZIO XT patch monitor. Call them immediately if you see an orange light blinking on your  monitor.  If your monitor falls off in less than 4 days, contact our Monitor department at [phone].  If your monitor becomes loose or falls off after 4 days call [employer] at [phone] for  suggestions on securing your monitor

## 2022-11-01 NOTE — Progress Notes (Unsigned)
Enrolled for [employer] to mail a ZIO XT long term holter monitor to the patients address on file.  

## 2022-11-03 DIAGNOSIS — I4819 Other persistent atrial fibrillation: Secondary | ICD-10-CM | POA: Diagnosis not present

## 2022-11-03 DIAGNOSIS — I10 Essential (primary) hypertension: Secondary | ICD-10-CM | POA: Diagnosis not present

## 2022-11-07 ENCOUNTER — Encounter: Payer: Medicare Other | Admitting: Rehabilitative and Restorative Service Providers"

## 2022-11-09 ENCOUNTER — Ambulatory Visit (INDEPENDENT_AMBULATORY_CARE_PROVIDER_SITE_OTHER): Payer: Medicare Other | Admitting: Licensed Clinical Social Worker

## 2022-11-09 DIAGNOSIS — F4321 Adjustment disorder with depressed mood: Secondary | ICD-10-CM | POA: Diagnosis not present

## 2022-11-09 DIAGNOSIS — F3162 Bipolar disorder, current episode mixed, moderate: Secondary | ICD-10-CM

## 2022-11-10 ENCOUNTER — Encounter: Payer: Medicare Other | Admitting: Rehabilitative and Restorative Service Providers"

## 2022-11-13 ENCOUNTER — Encounter (HOSPITAL_COMMUNITY): Payer: Self-pay | Admitting: Licensed Clinical Social Worker

## 2022-11-13 NOTE — Progress Notes (Signed)
Virtual Visit via Video Note  I connected with Justin [name] on 11/13/22 at  1:30 PM EDT by a video enabled telemedicine application and verified that I am speaking with the correct person using two identifiers.  Location: Patient: home Provider: home office   I discussed the limitations of evaluation and management by telemedicine and the availability of in person appointments. The patient expressed understanding and agreed to proceed.   I discussed the assessment and treatment plan with the patient. The patient was provided an opportunity to ask questions and all were answered. The patient agreed with the plan and demonstrated an understanding of the instructions.   The patient was advised to call back or seek an in-person evaluation if the symptoms worsen or if the condition fails to improve as anticipated.  I provided 45 minutes of non-face-to-face time during this encounter.   [name], LCSW   THERAPIST PROGRESS NOTE  Session Time: 1:30pm-2:15pm  Participation Level: Active  Behavioral Response: NeatAlertEuthymic  Type of Therapy: Individual Therapy  Treatment Goals addressed: LTG: Reduce frequency, intensity, and duration of depression symptoms so that daily functioning is improved   ProgressTowards Goals: Progressing  Interventions: CBT  Summary: Justin [name] is a 77 y.o. male who presents with Bipolar I, mixed, moderate and grief  Suicidal/Homicidal: Nowithout intent/plan  Therapist Response: [name] engaged well in individual virtual session with clinician. Clinician processed thoughts and feelings over the past few weeks. Clinician noted improvement in relationship and communication with son, which has improved [name]'s mood. Clinician explored changes and his understanding that this may or may not be long standing. Clinician discussed the importance of finding joy and safety in himself, so as not to have to rely on others to make him feel love. Clinician  discussed coping with the anniversary of his wife's death. Clinician also noted the value of finding community with friends and neighbors.    Plan: Return again in 2-3 weeks.  Diagnosis: Bipolar 1 disorder, mixed, moderate ([place])  Grief  Collaboration of Care: Patient refused AEB none required  Patient/Guardian was advised Release of Information must be obtained prior to any record release in order to collaborate their care with an outside provider. Patient/Guardian was advised if they have not already done so to contact the registration department to sign all necessary forms in order for [place] to release information regarding their care.   Consent: Patient/Guardian gives verbal consent for treatment and assignment of benefits for services provided during this visit. Patient/Guardian expressed understanding and agreed to proceed.   Jobe Marker [place], LCSW 11/13/2022

## 2022-11-14 ENCOUNTER — Encounter: Payer: Medicare Other | Admitting: Rehabilitative and Restorative Service Providers"

## 2022-11-17 ENCOUNTER — Ambulatory Visit (HOSPITAL_COMMUNITY): Payer: Medicare Other | Attending: Cardiology

## 2022-11-17 DIAGNOSIS — I10 Essential (primary) hypertension: Secondary | ICD-10-CM | POA: Diagnosis not present

## 2022-11-17 DIAGNOSIS — I4819 Other persistent atrial fibrillation: Secondary | ICD-10-CM | POA: Diagnosis not present

## 2022-11-17 LAB — ECHOCARDIOGRAM COMPLETE
Area-P 1/2: 2.56 cm2
P 1/2 time: 667 msec
S' Lateral: 2.7 cm

## 2022-11-21 NOTE — Progress Notes (Deleted)
   Shirlyn Goltz, PhD, LAT, ATC acting as a scribe for [name], MD.  [name] is a 77 y.o. male who presents to [place] at [facility] today for f/u bilat thumb pain. He was given a L 1st [place] steroid injection on 10/06/22. Pt was last seen by Dr. Georgina Snell on 10/24/22 and was given a R 1st [place] steroid injection and was advised to finish out OT, but he canceled/no-showed all remaining visits. Today, pt reports ***  Dx imaging: 10/06/2022 R & L hand x-ray 06/09/17 R wrist XR   Pertinent review of systems: ***  Relevant historical information: ***   Exam:  There were no vitals taken for this visit. General: Well Developed, well nourished, and in no acute distress.   MSK: ***    Lab and Radiology Results No results found for this or any previous visit (from the past 72 hour(s)). No results found.     Assessment and Plan: 77 y.o. male with ***   PDMP not reviewed this encounter. No orders of the defined types were placed in this encounter.  No orders of the defined types were placed in this encounter.    Discussed warning signs or symptoms. Please see discharge instructions. Patient expresses understanding.   ***

## 2022-11-22 ENCOUNTER — Ambulatory Visit: Payer: Medicare Other | Admitting: Family Medicine

## 2022-11-23 ENCOUNTER — Ambulatory Visit (HOSPITAL_COMMUNITY): Payer: Medicare Other | Admitting: Licensed Clinical Social Worker

## 2022-11-24 ENCOUNTER — Other Ambulatory Visit: Payer: Self-pay | Admitting: Internal Medicine

## 2022-11-29 ENCOUNTER — Telehealth: Payer: Self-pay

## 2022-11-29 DIAGNOSIS — I4819 Other persistent atrial fibrillation: Secondary | ICD-10-CM

## 2022-11-29 NOTE — Telephone Encounter (Signed)
The patient has been notified of the result and verbalized understanding.  All questions (if any) were answered. Frutoso Schatz, RN 11/29/2022 4:03 PM  Patient would like to repeat ablation. I have saved 6/25 for his procedure date. He will see Dr. Lalla Brothers on 5/14 to discuss further. Labs and CT ordered.

## 2022-11-29 NOTE — Telephone Encounter (Signed)
-----   Message from Lanier Prude, MD sent at 11/27/2022  8:30 PM EDT ----- Monitor shows some of his symptoms are due  to more AF. We have discussed potentially taking him back for a touch up ablation if monitoring showed more AF.  Carly, can you touch base with him to see what he would like to do. We can either plan to discuss further at our upcoming appointment in May or go ahead and get him on the schedule for redo ablation.  Sheria Lang T. Lalla Brothers, MD, [facility], Operating Room Services Cardiac Electrophysiology

## 2022-12-01 ENCOUNTER — Ambulatory Visit (INDEPENDENT_AMBULATORY_CARE_PROVIDER_SITE_OTHER): Payer: Medicare Other | Admitting: Licensed Clinical Social Worker

## 2022-12-01 ENCOUNTER — Encounter (HOSPITAL_COMMUNITY): Payer: Self-pay | Admitting: Licensed Clinical Social Worker

## 2022-12-01 DIAGNOSIS — F3162 Bipolar disorder, current episode mixed, moderate: Secondary | ICD-10-CM | POA: Diagnosis not present

## 2022-12-01 DIAGNOSIS — F4321 Adjustment disorder with depressed mood: Secondary | ICD-10-CM | POA: Diagnosis not present

## 2022-12-01 NOTE — Progress Notes (Signed)
Virtual Visit via Video Note  I connected with Estill Bakes on 12/01/22 at  2:00 PM EDT by a video enabled telemedicine application and verified that I am speaking with the correct person using two identifiers.  Location: Patient: home Provider: home office   I discussed the limitations of evaluation and management by telemedicine and the availability of in person appointments. The patient expressed understanding and agreed to proceed.   I discussed the assessment and treatment plan with the patient. The patient was provided an opportunity to ask questions and all were answered. The patient agreed with the plan and demonstrated an understanding of the instructions.   The patient was advised to call back or seek an in-person evaluation if the symptoms worsen or if the condition fails to improve as anticipated.  I provided 45 minutes of non-face-to-face time during this encounter.   [name], LCSW   THERAPIST PROGRESS NOTE  Session Time: 2:00pm-2:45pm  Participation Level: Active  Behavioral Response: NeatAlertDepressed and Irritable  Type of Therapy: Individual Therapy  Treatment Goals addressed:  LTG: Reduce frequency, intensity, and duration of depression symptoms so that daily functioning is improved   ProgressTowards Goals: Progressing  Interventions: CBT  Summary: CLAUDIS MATTHES is a 77 y.o. male who presents with Bipolar I mixed moderate and grief  Suicidal/Homicidal: Nowithout intent/plan  Therapist Response: [name] engaged well in individual session with clinician. Clinician utilized CBT to process recent information learned by [name] about the death of his brother 50 years ago. Clinician noted that [name]'s sister informed him that his brother was killed by a Emergency planning/management officer, and that he did not die by suicide. Clinician processed the thoughts and feelings about this news. Clinician noted that the grieving process may be starting over, due to the news. Clinician  encouraged [name] to allow his grief to come and go, as he has in the past. Clinician also discussed the importance of forgiving his family for not telling him when it happened, due to [name]'s volatile reactions to stress at that time. Clinician noted that if it is this distressing 50 years later, we can only imagine how upsetting and potentially dangerous the situation may have been back then, prior to diagnoses and treatment of Bipolar disorder.   Plan: Return again in 2 weeks.  Diagnosis: Bipolar 1 disorder, mixed, moderate  Grief  Collaboration of Care: Patient refused AEB none required  Patient/Guardian was advised Release of Information must be obtained prior to any record release in order to collaborate their care with an outside provider. Patient/Guardian was advised if they have not already done so to contact the registration department to sign all necessary forms in order for [place] to release information regarding their care.   Consent: Patient/Guardian gives verbal consent for treatment and assignment of benefits for services provided during this visit. Patient/Guardian expressed understanding and agreed to proceed.   Chryl Heck [place], LCSW 12/01/2022

## 2022-12-08 ENCOUNTER — Telehealth (HOSPITAL_BASED_OUTPATIENT_CLINIC_OR_DEPARTMENT_OTHER): Payer: Medicare Other | Admitting: Psychiatry

## 2022-12-08 ENCOUNTER — Encounter (HOSPITAL_COMMUNITY): Payer: Self-pay | Admitting: Psychiatry

## 2022-12-08 VITALS — Wt 170.0 lb

## 2022-12-08 DIAGNOSIS — F4310 Post-traumatic stress disorder, unspecified: Secondary | ICD-10-CM | POA: Diagnosis not present

## 2022-12-08 DIAGNOSIS — F3162 Bipolar disorder, current episode mixed, moderate: Secondary | ICD-10-CM

## 2022-12-08 MED ORDER — LAMOTRIGINE 200 MG PO TABS: 200.0000 mg | ORAL_TABLET | Freq: Every day | ORAL | 0 refills | Status: DC

## 2022-12-08 NOTE — Progress Notes (Signed)
[facility] MD Virtual Progress Note   Patient Location: Home Provider Location: Office  I connect with patient by Telephone and verified that I am speaking with correct person by using two identifiers. I discussed the limitations of evaluation and management by telemedicine and the availability of in person appointments. I also discussed with the patient that there may be a patient responsible charge related to this service. The patient expressed understanding and agreed to proceed.  Justin [name] [id number] 77 y.o.  12/08/2022 2:34 PM  History of Present Illness:  Patient is evaluated by phone session.  He promised to have a video session next time.  He stopped taking Cymbalta after he did not see any improvement.  He is taking Lamictal which is helping his depression, mood, mania.  He reported his anxiety is also much better.  Overall he feels the depression is better.  He recently saw a neurologist and find out that he has nerve damage which may happen during his surgery.  He is scheduled for EMG.  He is in therapy with Shanda Bumps.  Since back on Lamictal he denies any crying spells or any feeling of hopelessness or worthlessness.  His appetite is okay.  He is concerned about his heart and seeing cardiology.  He had ablation and seen by cardiology recently.  He denies any hallucination or any paranoia.  Denies any suicidal thoughts.  Occasionally has nightmares and flashback but overall sleep is improved.  He recently find out that his brother did not kill himself more than 40 years ago, it was actually shot by police officer who was later left the police department.  Patient told his sister told that it was not disclosed to the patient because at that time patient was very volatile and did not want any more issues.  Patient did not know why police officers shot his brother.  He is talking to Shanda Bumps about his past memories and so far no major issues.  He has no tremors or shakes.  He  take Xanax prescribed by PCP as needed.  Past Psychiatric History: H/O suicidal attempt by overdose on Ambien twice, hydroxyzine and alcohol. H/O bipolar disorder with multiple inpatient.  Did IOP. Last ER visit Sept 2020 and inpatient October 2018 at [facility]. Tried Zyprexa, Cymbalta, Seroquel, Vistaril, Neurontin, Wellbutrin and triptal. Tried low-dose doxepin and trazodone. Had a reaction with Ativan and pain medication.  Saw Dr. Emerson Monte in past.   Outpatient Encounter Medications as of 12/08/2022  Medication Sig   ALPRAZolam (XANAX) 1 MG tablet TAKE 1/2 TO 1 TABLET BY MOUTH ONCE DAILY   diltiazem (CARDIZEM CD) 180 MG 24 hr capsule TAKE 1 CAPSULE BY MOUTH DAILY   gabapentin (NEURONTIN) 300 MG capsule Take one in AM and 2 in PM.   isosorbide mononitrate (IMDUR) 30 MG 24 hr tablet TAKE 1 TABLET BY MOUTH DAILY   nitroGLYCERIN (NITROSTAT) 0.4 MG SL tablet Place 1 tablet (0.4 mg total) under the tongue every 5 (five) minutes as needed for chest pain.   rivaroxaban (XARELTO) 20 MG TABS tablet Take 1 tablet (20 mg total) by mouth daily.   rosuvastatin (CRESTOR) 20 MG tablet TAKE 1 TABLET BY MOUTH DAILY   No facility-administered encounter medications on file as of 12/08/2022.    Recent Results (from the past 2160 hour(s))  Basic metabolic panel     Status: Abnormal   Collection Time: 09/19/22  8:30 AM  Result Value Ref Range   Glucose 102 (H) 70 - 99  mg/dL   BUN 15 8 - 27 mg/dL   Creatinine, Ser 1.61 0.76 - 1.27 mg/dL   eGFR 89 >09 UE/AVW/0.98   BUN/Creatinine Ratio 17 10 - 24   Sodium 133 (L) 134 - 144 mmol/L   Potassium 4.7 3.5 - 5.2 mmol/L   Chloride 99 96 - 106 mmol/L   CO2 26 20 - 29 mmol/L   Calcium 9.0 8.6 - 10.2 mg/dL  CBC with Differential/Platelet     Status: Abnormal   Collection Time: 09/19/22  8:30 AM  Result Value Ref Range   WBC 5.4 3.4 - 10.8 x10E3/uL   RBC 3.92 (L) 4.14 - 5.80 x10E6/uL   Hemoglobin 13.1 13.0 - 17.7 g/dL   Hematocrit 11.9 14.7 - 51.0 %   MCV 97 79  - 97 fL   MCH 33.4 (H) 26.6 - 33.0 pg   MCHC 34.6 31.5 - 35.7 g/dL   RDW 82.9 56.2 - 13.0 %   Platelets 157 150 - 450 x10E3/uL   Neutrophils 75 Not Estab. %   Lymphs 14 Not Estab. %   Monocytes 10 Not Estab. %   Eos 1 Not Estab. %   Basos 0 Not Estab. %   Neutrophils Absolute 4.0 1.4 - 7.0 x10E3/uL   Lymphocytes Absolute 0.8 0.7 - 3.1 x10E3/uL   Monocytes Absolute 0.6 0.1 - 0.9 x10E3/uL   EOS (ABSOLUTE) 0.1 0.0 - 0.4 x10E3/uL   Basophils Absolute 0.0 0.0 - 0.2 x10E3/uL  ECHOCARDIOGRAM COMPLETE     Status: None   Collection Time: 11/17/22  2:10 PM  Result Value Ref Range   Area-P 1/2 2.56 cm2   S' Lateral 2.70 cm   P 1/2 time 667 msec   Est EF 55 - 60%      Psychiatric Specialty Exam: Physical Exam  Review of Systems  Weight 170 lb (77.1 kg).There is no height or weight on file to calculate BMI.  General Appearance: NA  Eye Contact:  NA  Speech:  Normal Rate  Volume:  Normal  Mood:  Euthymic  Affect:  NA  Thought Process:  Goal Directed  Orientation:  Full (Time, Place, and Person)  Thought Content:  Rumination  Suicidal Thoughts:  No  Homicidal Thoughts:  No  Memory:  Immediate;   Fair Recent;   Fair Remote;   Fair  Judgement:  Intact  Insight:  Present  Psychomotor Activity:  NA  Concentration:  Concentration: Fair and Attention Span: Fair  Recall:  [employer] of Knowledge:  Fair  Language:  Good  Akathisia:  No  Handed:  Right  AIMS (if indicated):     Assets:  Communication Skills Desire for Improvement Housing Transportation  ADL's:  Intact  Cognition:  WNL  Sleep:  better     Assessment/Plan: Bipolar 1 disorder, mixed, moderate - Plan: lamoTRIgine (LAMICTAL) 200 MG tablet  PTSD (post-traumatic stress disorder) - Plan: lamoTRIgine (LAMICTAL) 200 MG tablet  Patient is stable on Lamictal 200 mg daily.  He is no longer taking Cymbalta.  Discussed medication side effects and benefits.  Recommend to see Shanda Bumps on a regular basis.  Recommend to call  us back if is any question or any concern.  Follow-up in 3 months.   Follow Up Instructions:     I discussed the assessment and treatment plan with the patient. The patient was provided an opportunity to ask questions and all were answered. The patient agreed with the plan and demonstrated an understanding of the instructions.   The  patient was advised to call back or seek an in-person evaluation if the symptoms worsen or if the condition fails to improve as anticipated.    Collaboration of Care: Other provider involved in patient's care AEB notes are available in epic to review.  Patient/Guardian was advised Release of Information must be obtained prior to any record release in order to collaborate their care with an outside provider. Patient/Guardian was advised if they have not already done so to contact the registration department to sign all necessary forms in order for [place] to release information regarding their care.   Consent: Patient/Guardian gives verbal consent for treatment and assignment of benefits for services provided during this visit. Patient/Guardian expressed understanding and agreed to proceed.     I provided 20 minutes of non face to face time during this encounter.  Note: This document was prepared by [employer] voice dictation technology and any errors that results from this process are unintentional.    Cleotis Nipper, MD 12/08/2022

## 2022-12-13 ENCOUNTER — Ambulatory Visit (INDEPENDENT_AMBULATORY_CARE_PROVIDER_SITE_OTHER): Payer: Medicare Other | Admitting: Licensed Clinical Social Worker

## 2022-12-13 ENCOUNTER — Encounter (HOSPITAL_COMMUNITY): Payer: Self-pay | Admitting: Licensed Clinical Social Worker

## 2022-12-13 DIAGNOSIS — F3162 Bipolar disorder, current episode mixed, moderate: Secondary | ICD-10-CM

## 2022-12-13 NOTE — Progress Notes (Signed)
Virtual Visit via Video Note  I connected with Justin [name] on 12/13/22 at  9:00 AM EDT by a video enabled telemedicine application and verified that I am speaking with the correct person using two identifiers.  Location: Patient: home Provider: office   I discussed the limitations of evaluation and management by telemedicine and the availability of in person appointments. The patient expressed understanding and agreed to proceed.   I discussed the assessment and treatment plan with the patient. The patient was provided an opportunity to ask questions and all were answered. The patient agreed with the plan and demonstrated an understanding of the instructions.   The patient was advised to call back or seek an in-person evaluation if the symptoms worsen or if the condition fails to improve as anticipated.  I provided 45 minutes of non-face-to-face time during this encounter.   Justin Melter, LCSW   THERAPIST PROGRESS NOTE  Session Time: 9:00am-9:45am  Participation Level: Active  Behavioral Response: NeatAlertHopeless and Irritable  Type of Therapy: Individual Therapy  Treatment Goals addressed:  LTG: Reduce frequency, intensity, and duration of depression symptoms so that daily functioning is improved   ProgressTowards Goals: Progressing  Interventions: CBT  Summary: Justin [name] is a 77 y.o. male who presents with Bipolar I, mixed, moderate.   Suicidal/Homicidal: Nowithout intent/plan  Therapist Response: Justin [name] engaged well in virtual individual session. Clinician utilized CBT to process interactions and concerns over the past week. Clinician processed experiences with son. Clinician utilized CBT reality testing about his ability to earn trust back with daughter in Social worker. Clinician noted that regardless of Justin [name]'s improvements in his mental health, his daughter in law and her family will not really trust him with the children due to past experiences. Clinician  identified behaviors and communication with son, noting that he only calls when the wife is away, will not invite him to the house but will come by alone, and will ask for Justin [name] to help care for the dog, but not the kids.  Justin [name] shared he struggles to trust his sister and his son because of how they have treated him in the past. Clinician processed Justin [name] as his own individual and identified the option of focusing on himself and those who are really caring and thoughtful/helpful for him, rather than trying to win back the affection and trust of those who will not see the changes.   Plan: Return again in 1 weeks.  Diagnosis: Bipolar 1 disorder, mixed, moderate  Collaboration of Care: Psychiatrist AEB reviewed last note from Dr. Lolly Mustache  Patient/Guardian was advised Release of Information must be obtained prior to any record release in order to collaborate their care with an outside provider. Patient/Guardian was advised if they have not already done so to contact the registration department to sign all necessary forms in order for [place] to release information regarding their care.   Consent: Patient/Guardian gives verbal consent for treatment and assignment of benefits for services provided during this visit. Patient/Guardian expressed understanding and agreed to proceed.   Justin Heck Lockhart, LCSW 12/13/2022

## 2022-12-15 ENCOUNTER — Telehealth: Payer: Self-pay

## 2022-12-15 NOTE — Telephone Encounter (Signed)
Contacted Justin [name] to schedule their annual wellness visit. Appointment made for 12/22/22.

## 2022-12-20 ENCOUNTER — Ambulatory Visit (HOSPITAL_COMMUNITY): Payer: Medicare Other | Admitting: Licensed Clinical Social Worker

## 2022-12-20 DIAGNOSIS — F3162 Bipolar disorder, current episode mixed, moderate: Secondary | ICD-10-CM | POA: Diagnosis not present

## 2022-12-20 NOTE — Progress Notes (Signed)
   THERAPIST PROGRESS NOTE  Session Time: 11:00am-11:55am  Participation Level: Active  Behavioral Response: Well GroomedAlertEuthymic  Type of Therapy: Individual Therapy  Treatment Goals addressed: LTG: Reduce frequency, intensity, and duration of depression symptoms so that daily functioning is improved   ProgressTowards Goals: Progressing  Interventions: CBT  Summary: Justin [name] is a 77 y.o. male who presents with Bipolar I Disorder, mixed, moderate.   Suicidal/Homicidal: Nowithout intent/plan  Therapist Response: [name] engaged well in individual session with clinician. Clinician utilized CBT to process thoughts, feelings, and interactions with others. Clinician discussed the way [name] copes with daily stresses, as there will always be something happening in his life: son, neighbors, pharmacy, meds, sister, etc. Clinician provided time and space for [name] to share updates in his interactions, noting increased communication with his family. Clinician discussed the challenges of having people in this support system that he does not fully trust. [name] shared that due to multiple incidents in past, he is unable to really trust his son, daughter in law, or sister. He shared communication and time spent helping neighbors. Clinician identified the active choice to share what he can, but to also be mindful of his audience.   Plan: Return again in 2 weeks.  Diagnosis: Bipolar 1 disorder, mixed, moderate (HCC)  Collaboration of Care: Patient refused AEB none required  Patient/Guardian was advised Release of Information must be obtained prior to any record release in order to collaborate their care with an outside provider. Patient/Guardian was advised if they have not already done so to contact the registration department to sign all necessary forms in order for [place] to release information regarding their care.   Consent: Patient/Guardian gives verbal consent for treatment and assignment of  benefits for services provided during this visit. Patient/Guardian expressed understanding and agreed to proceed.   Chryl Heck Spry, LCSW 12/20/2022

## 2022-12-22 ENCOUNTER — Ambulatory Visit (INDEPENDENT_AMBULATORY_CARE_PROVIDER_SITE_OTHER): Payer: Medicare Other

## 2022-12-22 ENCOUNTER — Encounter: Payer: Medicare Other | Admitting: Neurology

## 2022-12-22 ENCOUNTER — Ambulatory Visit: Payer: Medicare Other | Admitting: Neurology

## 2022-12-22 VITALS — Ht 71.0 in | Wt 170.0 lb

## 2022-12-22 DIAGNOSIS — Z0000 Encounter for general adult medical examination without abnormal findings: Secondary | ICD-10-CM

## 2022-12-22 DIAGNOSIS — M2141 Flat foot [pes planus] (acquired), right foot: Secondary | ICD-10-CM

## 2022-12-22 DIAGNOSIS — M79671 Pain in right foot: Secondary | ICD-10-CM

## 2022-12-22 DIAGNOSIS — G5791 Unspecified mononeuropathy of right lower limb: Secondary | ICD-10-CM

## 2022-12-22 NOTE — Patient Instructions (Signed)
Justin [name] , Thank you for taking time to come for your Medicare Wellness Visit. I appreciate your ongoing commitment to your health goals. Please review the following plan we discussed and let me know if I can assist you in the future.   These are the goals we discussed:  Goals      Patient Stated     Would like to get his ankle to stop hurting so he can walk normal        This is a list of the screening recommended for you and due dates:  Health Maintenance  Topic Date Due   Zoster (Shingles) Vaccine (2 of 2) 11/28/2016   Colon Cancer Screening  07/21/2022   Flu Shot  03/23/2023   Medicare Annual Wellness Visit  12/22/2023   DTaP/Tdap/Td vaccine (2 - Tdap) 03/25/2025   Pneumonia Vaccine  Completed   Hepatitis C Screening: USPSTF Recommendation to screen - Ages 18-79 yo.  Completed   HPV Vaccine  Aged Out   COVID-19 Vaccine  Discontinued    Advanced directives: Yes  Conditions/risks identified: Yes  Next appointment: Follow up in one year for your annual wellness visit.   Preventive Care 75 Years and Older, Male  Preventive care refers to lifestyle choices and visits with your health care provider that can promote health and wellness. What does preventive care include? A yearly physical exam. This is also called an annual well check. Dental exams once or twice a year. Routine eye exams. Ask your health care provider how often you should have your eyes checked. Personal lifestyle choices, including: Daily care of your teeth and gums. Regular physical activity. Eating a healthy diet. Avoiding tobacco and drug use. Limiting alcohol use. Practicing safe sex. Taking low doses of aspirin every day. Taking vitamin and mineral supplements as recommended by your health care provider. What happens during an annual well check? The services and screenings done by your health care provider during your annual well check will depend on your age, overall health, lifestyle risk  factors, and family history of disease. Counseling  Your health care provider may ask you questions about your: Alcohol use. Tobacco use. Drug use. Emotional well-being. Home and relationship well-being. Sexual activity. Eating habits. History of falls. Memory and ability to understand (cognition). Work and work Astronomer. Screening  You may have the following tests or measurements: Height, weight, and BMI. Blood pressure. Lipid and cholesterol levels. These may be checked every 5 years, or more frequently if you are over 48 years old. Skin check. Lung cancer screening. You may have this screening every year starting at age 57 if you have a 30-pack-year history of smoking and currently smoke or have quit within the past 15 years. Fecal occult blood test (FOBT) of the stool. You may have this test every year starting at age 65. Flexible sigmoidoscopy or colonoscopy. You may have a sigmoidoscopy every 5 years or a colonoscopy every 10 years starting at age 23. Prostate cancer screening. Recommendations will vary depending on your family history and other risks. Hepatitis C blood test. Hepatitis B blood test. Sexually transmitted disease (STD) testing. Diabetes screening. This is done by checking your blood sugar (glucose) after you have not eaten for a while (fasting). You may have this done every 1-3 years. Abdominal aortic aneurysm (AAA) screening. You may need this if you are a current or former smoker. Osteoporosis. You may be screened starting at age 76 if you are at high risk. Talk with your health  care provider about your test results, treatment options, and if necessary, the need for more tests. Vaccines  Your health care provider may recommend certain vaccines, such as: Influenza vaccine. This is recommended every year. Tetanus, diphtheria, and acellular pertussis (Tdap, Td) vaccine. You may need a Td booster every 10 years. Zoster vaccine. You may need this after age  41. Pneumococcal 13-valent conjugate (PCV13) vaccine. One dose is recommended after age 17. Pneumococcal polysaccharide (PPSV23) vaccine. One dose is recommended after age 73. Talk to your health care provider about which screenings and vaccines you need and how often you need them. This information is not intended to replace advice given to you by your health care provider. Make sure you discuss any questions you have with your health care provider. Document Released: 09/04/2015 Document Revised: 04/27/2016 Document Reviewed: 06/09/2015 Elsevier Interactive Patient Education  2017 [employer] Prevention in the Home Falls can cause injuries. They can happen to people of all ages. There are many things you can do to make your home safe and to help prevent falls. What can I do on the outside of my home? Regularly fix the edges of walkways and driveways and fix any cracks. Remove anything that might make you trip as you walk through a door, such as a raised step or threshold. Trim any bushes or trees on the path to your home. Use bright outdoor lighting. Clear any walking paths of anything that might make someone trip, such as rocks or tools. Regularly check to see if handrails are loose or broken. Make sure that both sides of any steps have handrails. Any raised decks and porches should have guardrails on the edges. Have any leaves, snow, or ice cleared regularly. Use sand or salt on walking paths during winter. Clean up any spills in your garage right away. This includes oil or grease spills. What can I do in the bathroom? Use night lights. Install grab bars by the toilet and in the tub and shower. Do not use towel bars as grab bars. Use non-skid mats or decals in the tub or shower. If you need to sit down in the shower, use a plastic, non-slip stool. Keep the floor dry. Clean up any water that spills on the floor as soon as it happens. Remove soap buildup in the tub or shower  regularly. Attach bath mats securely with double-sided non-slip rug tape. Do not have throw rugs and other things on the floor that can make you trip. What can I do in the bedroom? Use night lights. Make sure that you have a light by your bed that is easy to reach. Do not use any sheets or blankets that are too big for your bed. They should not hang down onto the floor. Have a firm chair that has side arms. You can use this for support while you get dressed. Do not have throw rugs and other things on the floor that can make you trip. What can I do in the kitchen? Clean up any spills right away. Avoid walking on wet floors. Keep items that you use a lot in easy-to-reach places. If you need to reach something above you, use a strong step stool that has a grab bar. Keep electrical cords out of the way. Do not use floor polish or wax that makes floors slippery. If you must use wax, use non-skid floor wax. Do not have throw rugs and other things on the floor that can make you trip. What can  I do with my stairs? Do not leave any items on the stairs. Make sure that there are handrails on both sides of the stairs and use them. Fix handrails that are broken or loose. Make sure that handrails are as long as the stairways. Check any carpeting to make sure that it is firmly attached to the stairs. Fix any carpet that is loose or worn. Avoid having throw rugs at the top or bottom of the stairs. If you do have throw rugs, attach them to the floor with carpet tape. Make sure that you have a light switch at the top of the stairs and the bottom of the stairs. If you do not have them, ask someone to add them for you. What else can I do to help prevent falls? Wear shoes that: Do not have high heels. Have rubber bottoms. Are comfortable and fit you well. Are closed at the toe. Do not wear sandals. If you use a stepladder: Make sure that it is fully opened. Do not climb a closed stepladder. Make sure that  both sides of the stepladder are locked into place. Ask someone to hold it for you, if possible. Clearly mark and make sure that you can see: Any grab bars or handrails. First and last steps. Where the edge of each step is. Use tools that help you move around (mobility aids) if they are needed. These include: Canes. Walkers. Scooters. Crutches. Turn on the lights when you go into a dark area. Replace any light bulbs as soon as they burn out. Set up your furniture so you have a clear path. Avoid moving your furniture around. If any of your floors are uneven, fix them. If there are any pets around you, be aware of where they are. Review your medicines with your doctor. Some medicines can make you feel dizzy. This can increase your chance of falling. Ask your doctor what other things that you can do to help prevent falls. This information is not intended to replace advice given to you by your health care provider. Make sure you discuss any questions you have with your health care provider. Document Released: 06/04/2009 Document Revised: 01/14/2016 Document Reviewed: 09/12/2014 Elsevier Interactive Patient Education  2017 [employer].

## 2022-12-22 NOTE — Progress Notes (Signed)
I connected with  Estill Bakes on 12/22/22 by a audio enabled telemedicine application and verified that I am speaking with the correct person using two identifiers.  Patient Location: Home  Provider Location: Office/Clinic  I discussed the limitations of evaluation and management by telemedicine. The patient expressed understanding and agreed to proceed.  Subjective:   Justin [name] is a 77 y.o. male who presents for Medicare Annual/Subsequent preventive examination.  Review of Systems     Cardiac Risk Factors include: advanced age (>37men, >73 women);dyslipidemia;family history of premature cardiovascular disease;hypertension;male gender;sedentary lifestyle     Objective:    Today's Vitals   12/22/22 1305  Weight: 170 lb (77.1 kg)  Height: 5\' 11"  (1.803 m)  PainSc: 0-No pain   Body mass index is 23.71 kg/m.     12/22/2022    1:16 PM 10/24/2022    1:09 PM 09/23/2022    9:18 AM 07/28/2022   11:14 AM 05/18/2022    8:13 PM 05/17/2022    8:09 AM 12/23/2021    8:38 AM  Advanced Directives  Does Patient Have a Medical Advance Directive? Yes Yes Yes Yes No Yes No  Type of Estate agent of [place];Living will  Healthcare Power of [place];Living will Healthcare Power of [place];Living will Healthcare Power of [place];Living will Healthcare Power of [place];Living will   Does patient want to make changes to medical advance directive?  No - Patient declined    No - Patient declined   Copy of Healthcare Power of Attorney in Chart? No - copy requested  No - copy requested Yes - validated most recent copy scanned in chart (See row information) No - copy requested, Physician notified No - copy requested   Would patient like information on creating a medical advance directive?       No - Patient declined    Current Medications (verified) Outpatient Encounter Medications as of 12/22/2022  Medication Sig   ALPRAZolam (XANAX) 1 MG tablet TAKE 1/2 TO 1 TABLET BY MOUTH  ONCE DAILY   diltiazem (CARDIZEM CD) 180 MG 24 hr capsule TAKE 1 CAPSULE BY MOUTH DAILY   gabapentin (NEURONTIN) 300 MG capsule Take one in AM and 2 in PM.   isosorbide mononitrate (IMDUR) 30 MG 24 hr tablet TAKE 1 TABLET BY MOUTH DAILY   lamoTRIgine (LAMICTAL) 200 MG tablet Take 1 tablet (200 mg total) by mouth daily.   nitroGLYCERIN (NITROSTAT) 0.4 MG SL tablet Place 1 tablet (0.4 mg total) under the tongue every 5 (five) minutes as needed for chest pain.   rivaroxaban (XARELTO) 20 MG TABS tablet Take 1 tablet (20 mg total) by mouth daily.   rosuvastatin (CRESTOR) 20 MG tablet TAKE 1 TABLET BY MOUTH DAILY   No facility-administered encounter medications on file as of 12/22/2022.    Allergies (verified) Ambien [zolpidem tartrate], Codeine, Amiodarone, Ativan [lorazepam], Amitriptyline, and Seroquel [quetiapine]   History: Past Medical History:  Diagnosis Date   Anxiety and depression    Aortic insufficiency 03/11/2022   Echocardiogram 02/2022: EF 70-75, no RWMA, moderate LVH, normal RVSF, normal PASP (RVSP 20), moderate LAE, mild MR, moderate AI, AV sclerosis without stenosis, no aortic root or ascending aorta dilation   Arthritis    knees, c-spine // gets lumbar ESI q 6 mos   Bipolar disorder (HCC)    CAD (coronary artery disease)    [place] 08/2009 >> LHC ([facility]) - mLAD 70, Dx 65 >> PCI:  3x15 mm Endeavor DES to mLAD and 2.5x12  mm Endeavor DES to Dx // S/p NSTEMI >> LHC 8/12 ([facility]) - LM ok, LAD prox and mid 40; LAD stent ok, Dx stent ok, mRCA 30, mLCx 50 >> med Rx // ETT-Echo 2/17 ([facility]): Normal, EF 55-60 at rest   Chicken pox    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HCC)    Grief    History of echocardiogram    Echo 3/19: Mild concentric LVH, EF 60-65, normal wall motion, grade 1 diastolic dysfunction, mild AI, mildly dilated aortic root (40 mm), MAC, mild LAE, atrial septal lipomatous hypertrophy // Echo 8/22: EF 55-60, no RWMA, mild LVH, GRII DD, normal RVSF, RVSP 40, mild LAE, trivial  MR, mild-moderate AI, AV sclerosis without stenosis, mild dilation of aortic root (39 mm)   History of non-ST elevation myocardial infarction (NSTEMI)    History of nuclear stress test    Nuclear stress test 3/19: EF 57, inferior/inferoseptal/inferolateral defect consistent with probable soft tissue attenuation (cannot exclude subendocardial scar), no ischemia, intermediate risk >> Echo 3/19 normal EF, normal wall motion   History of pneumonia 2011   History of prostatitis 1990s   Hx of adenomatous colonic polyps 06/22/2016   Hyperlipidemia    Hypertension    Migraines    prior history   PTSD (post-traumatic stress disorder)    Past Surgical History:  Procedure Laterality Date   ATRIAL FIBRILLATION ABLATION N/A 05/17/2022   Procedure: ATRIAL FIBRILLATION ABLATION;  Surgeon: Lanier Prude, MD;  Location: [facility] INVASIVE CV LAB;  Service: Cardiovascular;  Laterality: N/A;   CARDIOVERSION N/A 08/05/2021   Procedure: CARDIOVERSION;  Surgeon: Elease Hashimoto Deloris Ping, MD;  Location: [facility] ENDOSCOPY;  Service: Cardiovascular;  Laterality: N/A;   CARDIOVERSION N/A 07/28/2022   Procedure: CARDIOVERSION;  Surgeon: Chrystie Nose, MD;  Location: [facility] ENDOSCOPY;  Service: Cardiovascular;  Laterality: N/A;   CARDIOVERSION N/A 09/23/2022   Procedure: CARDIOVERSION;  Surgeon: Parke Poisson, MD;  Location: [facility] ENDOSCOPY;  Service: Cardiovascular;  Laterality: N/A;   CERVICAL DISCECTOMY     COLONOSCOPY  03/2017   ELBOW SURGERY Left    ELBOW SURGERY Right    INGUINAL HERNIA REPAIR Bilateral    1996, 1997   KNEE ARTHROSCOPY Right    KNEE CARTILAGE SURGERY Left    NASAL SEPTUM SURGERY     STENT PLACEMENT VASCULAR ([facility] HX)  09/02/2010   TONSILLECTOMY     TOTAL KNEE ARTHROPLASTY Left 08/31/2018   Procedure: TOTAL KNEE ARTHROPLASTY;  Surgeon: Jodi Geralds, MD;  Location: [facility] ORS;  Service: Orthopedics;  Laterality: Left;   Family History  Problem Relation Age of Onset   Arthritis Mother    Heart disease  Mother        s/p CABG   Hypertension Mother    Alzheimer's disease Mother    Heart attack Mother    Arthritis Father    Hyperlipidemia Father    Heart disease Father        s/p CABG   Stroke Father    Hypertension Father    Heart attack Father    Multiple sclerosis Sister    Diabetes Sister    Lung cancer Paternal Grandmother    Diabetes Sister    Heart attack Sister    Pulmonary embolism Sister        died   Lung cancer Maternal Aunt    Lung cancer Paternal Aunt    Depression Brother        suicide   Social History   Socioeconomic History   Marital status:  Widowed    Spouse name: Not on file   Number of children: 2   Years of education: 72   Highest education level: Not on file  Occupational History   Occupation: Retired  Tobacco Use   Smoking status: Never   Smokeless tobacco: Never   Tobacco comments:    Never smoke 06/14/22  Vaping Use   Vaping Use: Never used  Substance and Sexual Activity   Alcohol use: Not Currently    Alcohol/week: 6.0 standard drinks of alcohol    Types: 6 Cans of beer per week    Comment: 3 beers in a sitting   Drug use: No   Sexual activity: Not Currently  Other Topics Concern   Not on file  Social History Narrative   Retired, widowed in 2017    1 son / 1 daughter   2 caffeinated beverages daily no alcohol or tobacco   Fun: Work out in the yard.   Denies religious beliefs effecting healthcare.    Worked at [employer] for 30 years   Social Determinants of [employer]: Low Risk  (12/22/2022)   Overall Financial Resource Strain (CARDIA)    Difficulty of Paying Living Expenses: Not hard at all  Food Insecurity: No Food Insecurity (12/22/2022)   Hunger Vital Sign    Worried About Running Out of Food in the Last Year: Never true    Ran Out of Food in the Last Year: Never true  Transportation Needs: No Transportation Needs (12/22/2022)   PRAPARE - Administrator, Civil Service (Medical): No    Lack  of Transportation (Non-Medical): No  Physical Activity: Inactive (12/22/2022)   Exercise Vital Sign    Days of Exercise per Week: 0 days    Minutes of Exercise per Session: 0 min  Stress: Stress Concern Present (12/22/2022)   [employer] of Occupational Health - Occupational Stress Questionnaire    Feeling of Stress : To some extent  Social Connections: Socially Isolated (12/22/2022)   Social Connection and Isolation Panel [NHANES]    Frequency of Communication with Friends and Family: More than three times a week    Frequency of Social Gatherings with Friends and Family: Once a week    Attends Religious Services: Never    Database administrator or Organizations: No    Attends Banker Meetings: Never    Marital Status: Widowed    Tobacco Counseling Counseling given: Not Answered Tobacco comments: Never smoke 06/14/22   Clinical Intake:  Pre-visit preparation completed: Yes  Pain : No/denies pain Pain Score: 0-No pain     BMI - recorded: 23.71 Nutritional Status: BMI of 19-24  Normal Nutritional Risks: None Diabetes: No  How often do you need to have someone help you when you read instructions, pamphlets, or other written materials from your doctor or pharmacy?: 1 - Never What is the last grade level you completed in school?: HSG  Diabetic? No  Interpreter Needed?: No  Information entered by :: Susie Cassette, LPN.   Activities of Daily Living    12/22/2022    1:16 PM 12/23/2021    8:41 AM  In your present state of health, do you have any difficulty performing the following activities:  Hearing? 0 0  Vision? 0 0  Difficulty concentrating or making decisions? 0 0  Walking or climbing stairs? 1 0  Dressing or bathing? 0 0  Doing errands, shopping? 0 0  Preparing Food and eating ? N  N  Using the Toilet? N N  In the past six months, have you accidently leaked urine? N N  Do you have problems with loss of bowel control? N N  Managing your  Medications? N N  Managing your Finances? N N  Housekeeping or managing your Housekeeping? N N    Patient Care Team: Corwin Levins, MD as PCP - General (Internal Medicine) Tonny Bollman, MD as PCP - Cardiology (Cardiology) Lanier Prude, MD as PCP - Electrophysiology (Cardiology) Kennon Rounds as Physician Assistant (Cardiology) Sanjuana Letters, MD as Referring Physician (Orthopedic Surgery) [name], LCSW as Counselor (Licensed Clinical Social Worker)  Indicate any recent Medical Services you may have received from other than [facility] providers in the past year (date may be approximate).     Assessment:   This is a routine wellness examination for Justin [name].  Hearing/Vision screen Hearing Screening - Comments:: Denies hearing difficulties   Vision Screening - Comments:: Wears rx glasses - up to date with routine eye exams with  [facility]  Dietary issues and exercise activities discussed: Current Exercise Habits: The patient does not participate in regular exercise at present, Exercise limited by: orthopedic condition(s);cardiac condition(s)   Goals Addressed   None   Depression Screen    12/22/2022    1:16 PM 05/13/2022    3:06 PM 02/02/2022   10:43 AM 12/23/2021    8:44 AM 10/12/2021    3:05 PM 10/12/2021    2:46 PM 12/23/2020   10:51 AM  PHQ 2/9 Scores  PHQ - 2 Score 2  5 4  0 2 1  PHQ- 9 Score 2  22 8  7    Exception Documentation  Patient refusal         Fall Risk    12/22/2022    1:13 PM 05/13/2022    3:06 PM 02/02/2022   10:43 AM 12/23/2021    8:39 AM 10/12/2021    3:05 PM  Fall Risk   Falls in the past year? 1 1 1 1  0  Number falls in past yr: 0 0 1 0 0  Injury with Fall? 1 1 1  0 0  Risk for fall due to : Orthopedic patient;Impaired balance/gait Impaired balance/gait  Impaired balance/gait   Follow up Education provided;Falls prevention discussed Falls evaluation completed  Falls evaluation completed;Education  provided;Falls prevention discussed     FALL RISK PREVENTION PERTAINING TO THE HOME:  Any stairs in or around the home? No  If so, are there any without handrails? No  Home free of loose throw rugs in walkways, pet beds, electrical cords, etc? Yes  Adequate lighting in your home to reduce risk of falls? Yes   ASSISTIVE DEVICES UTILIZED TO PREVENT FALLS:  Life alert? No  Use of a cane, walker or w/c? No  Grab bars in the bathroom? No  Shower chair or bench in shower? No  Elevated toilet seat or a handicapped toilet? No   TIMED UP AND GO:  Was the test performed? No . Telephonic Phone  Cognitive Function:    04/20/2020    2:43 PM 10/15/2019    2:34 PM 03/26/2015    9:14 AM  MMSE - Mini Mental State Exam  Orientation to time 5 5 5   Orientation to Place 5 5 5   Registration 3 3 3   Attention/ Calculation 5 5 5   Recall 3 1 3   Language- name 2 objects 2 2 2   Language- repeat 1 0  1  Language- follow 3 step command 3 3 3   Language- read & follow direction 1 0 1  Write a sentence 1 1 1   Copy design 1 1 1   Total score 30 26 30         12/22/2022    1:13 PM 12/23/2021    8:40 AM  6CIT Screen  What Year? 0 points 0 points  What month? 0 points 0 points  What time? 0 points 0 points  Count back from 20 0 points 0 points  Months in reverse 0 points 0 points  Repeat phrase 0 points 0 points  Total Score 0 points 0 points    Immunizations Immunization History  Administered Date(s) Administered   Fluad Quad(high Dose 65+) 05/15/2019, 10/12/2021, 07/25/2022   Influenza, High Dose Seasonal PF 05/05/2016, 05/22/2018   Influenza,inj,Quad PF,6+ Mos 06/26/2015   PFIZER(Purple Top)SARS-COV-2 Vaccination 10/24/2019, 11/19/2019, 05/04/2020   Pneumococcal Conjugate-13 08/07/2015, 05/05/2016   Pneumococcal Polysaccharide-23 06/09/2016   Td 03/26/2015   Zoster Recombinat (Shingrix) 10/03/2016   Zoster, Live 03/26/2015    TDAP status: Up to date  Flu Vaccine status: Up to  date  Pneumococcal vaccine status: Up to date  Covid-19 vaccine status: Completed vaccines  Qualifies for Shingles Vaccine? Yes   Zostavax completed Yes   Shingrix Completed?: Yes  Screening Tests Health Maintenance  Topic Date Due   Zoster Vaccines- Shingrix (2 of 2) 11/28/2016   COLONOSCOPY (Pts 45-53yrs Insurance coverage will need to be confirmed)  07/21/2022   INFLUENZA VACCINE  03/23/2023   Medicare Annual Wellness (AWV)  12/22/2023   DTaP/Tdap/Td (2 - Tdap) 03/25/2025   Pneumonia Vaccine 34+ Years old  Completed   Hepatitis C Screening  Completed   HPV VACCINES  Aged Out   COVID-19 Vaccine  Discontinued    Health Maintenance  Health Maintenance Due  Topic Date Due   Zoster Vaccines- Shingrix (2 of 2) 11/28/2016   COLONOSCOPY (Pts 45-41yrs Insurance coverage will need to be confirmed)  07/21/2022    Colorectal cancer screening: Type of screening: Colonoscopy. Completed 07/21/2022. Repeat every 3 years  Lung Cancer Screening: (Low Dose CT Chest recommended if Age 26-80 years, 30 pack-year currently smoking OR have quit w/in 15years.) does not qualify.   Lung Cancer Screening Referral: No  Additional Screening:  Hepatitis C Screening: does qualify; Completed 06/09/2016  Vision Screening: Recommended annual ophthalmology exams for early detection of glaucoma and other disorders of the eye. Is the patient up to date with their annual eye exam?  Yes  Who is the provider or what is the name of the office in which the patient attends annual eye exams? [facility] If pt is not established with a provider, would they like to be referred to a provider to establish care? No .   Dental Screening: Recommended annual dental exams for proper oral hygiene  Community Resource Referral / Chronic Care Management: CRR required this visit?  No   CCM required this visit?  No      Plan:     I have personally reviewed and noted the following in the patient's chart:    Medical and social history Use of alcohol, tobacco or illicit drugs  Current medications and supplements including opioid prescriptions. Patient is not currently taking opioid prescriptions. Functional ability and status Nutritional status Physical activity Advanced directives List of other physicians Hospitalizations, surgeries, and ER visits in previous 12 months Vitals Screenings to include cognitive, depression, and falls Referrals and appointments  In  addition, I have reviewed and discussed with patient certain preventive protocols, quality metrics, and best practice recommendations. A written personalized care plan for preventive services as well as general preventive health recommendations were provided to patient.     Mickeal Needy, LPN   08/27/1094   Nurse Notes:  Normal cognitive status assessed by direct observation telephone conversation by this Nurse Health Advisor. No abnormalities found.

## 2022-12-23 ENCOUNTER — Ambulatory Visit: Payer: Medicare Other

## 2022-12-25 ENCOUNTER — Encounter (HOSPITAL_COMMUNITY): Payer: Self-pay | Admitting: Licensed Clinical Social Worker

## 2022-12-25 NOTE — Progress Notes (Unsigned)
Full Name: Justin [name] Gender: Male MRN #: [id number] Date of Birth: 1945-10-18    Visit Date: 12/22/2022 14:45 Age: 77 Years Examining Physician: Dr. Naomie Dean Referring Physician: Dr. Porfirio Mylar Dohmeier Height: 5 feet 11 inch  History: This is a patient with right foot sensory pain that patient reports started after surgical procedure on the right foot.  Summary: Nerve conduction studies were performed on the bilateral lower extremities.  EMG needle exam was performed on the right lower extremity.  The right sural sensory nerve showed reduced amplitude (3 V, normal greater than 6).  The right superficial peroneal sensory nerve showed reduced amplitude (3 V, normal greater than 6). All remaining nerves (as indicated in the following tables) were within normal limits.  All muscles (as indicated in the following tables) were within normal limits.      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  Patient's asymptomatic left lower extremity had normal sensory and motor conductions so there is no indication of a widespread motor or sensory polyneuropathy.  ------------------------------- Naomie Dean, MD  [facility] [address] [place], [place] [address] Tel: [phone] Fax: [phone]  Verbal informed consent was obtained from the patient, patient was informed of potential risk of procedure, including bruising, bleeding, hematoma formation, infection, muscle weakness, muscle pain, numbness, among others.        MNC    Nerve / Sites Muscle Latency Ref. Amplitude Ref. Rel Amp Segments Distance Velocity Ref. Area    ms ms mV mV %  cm m/s m/s mVms  L Peroneal - EDB     Ankle EDB 4.8 ?6.5 4.7 ?2.0 100 Ankle - EDB 9   15.8     Fib head EDB 11.0  4.4  92.7 Fib  head - Ankle 26 42 ?44 16.8     Pop fossa EDB 13.7  4.8  108 Pop fossa - Fib head 12 44 ?44 16.9         Pop fossa - Ankle      R Peroneal - EDB     Ankle EDB 5.0 ?6.5 3.5 ?2.0 100 Ankle - EDB 9   11.1     Fib head EDB 10.7  3.1  86.8 Fib head - Ankle 25 44 ?44 11.0     Pop fossa EDB 13.2  3.9  125 Pop fossa - Fib head 12 47 ?44 14.0         Pop fossa - Ankle      L Tibial - AH     Ankle AH 5.5 ?5.8 9.0 ?4.0 100 Ankle - AH 9   17.7     Pop fossa AH 15.3  6.8  75.8 Pop fossa - Ankle 42 43 ?41 13.9  R Tibial - AH     Ankle AH 4.7 ?5.8 4.6 ?4.0 100 Ankle - AH 9   14.6     Pop fossa AH 13.6  3.2  69.4 Pop fossa - Ankle 38 43 ?41 14.9             SNC    Nerve / Sites Rec. Site Peak Lat Ref.  Amp Ref. Segments Distance    ms ms V V  cm  L Sural - Ankle (Calf)     Calf Ankle 3.5 ?4.4 8 ?6 Calf -  Ankle 14  R Sural - Ankle (Calf)     Calf Ankle 2.9 ?4.4 3 ?6 Calf - Ankle 14  L Superficial peroneal - Ankle     Lat leg Ankle 4.2 ?4.4 9 ?6 Lat leg - Ankle 14  R Superficial peroneal - Ankle     Lat leg Ankle 4.2 ?4.4 3 ?6 Lat leg - Ankle 14               F  Wave    Nerve F Lat Ref.   ms ms  L Tibial - AH 55.0 ?56.0       EMG Summary Table    Spontaneous MUAP Recruitment  Muscle IA Fib PSW Fasc Other Amp Dur. Poly Pattern  R. Vastus medialis Normal None None None _______ Normal Normal Normal Normal  R. Tibialis anterior Normal None None None _______ Normal Normal Normal Normal  R. Gastrocnemius (Medial head) Normal None None None _______ Normal Normal Normal Normal  R. Abductor hallucis Normal None None None _______ Normal Normal Normal Normal  R. Extensor hallucis longus Normal None None None _______ Normal Normal Normal Normal  R. Abductor digiti minimi (manus) Normal None None None _______ Normal Normal Normal Normal

## 2022-12-26 ENCOUNTER — Other Ambulatory Visit: Payer: Self-pay | Admitting: Internal Medicine

## 2022-12-27 NOTE — Procedures (Signed)
Full Name: Justin [name] Gender: Male MRN #: [id number] Date of Birth: 10-05-1945    Visit Date: 12/22/2022 14:45 Age: 77 Years Examining Physician: Dr. Naomie Dean Referring Physician: Dr. Porfirio Mylar Dohmeier Height: 5 feet 11 inch  History: This is a patient with right foot sensory pain that patient reports started after surgical procedure on the right foot.  Summary: Nerve conduction studies were performed on the bilateral lower extremities.  EMG needle exam was performed on the right lower extremity.  The right sural sensory nerve showed reduced amplitude (3 V, normal greater than 6).  The right superficial peroneal sensory nerve showed reduced amplitude (3 V, normal greater than 6). All remaining nerves (as indicated in the following tables) were within normal limits.  All muscles (as indicated in the following tables) were within normal limits.      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  Patient's asymptomatic left lower extremity had normal sensory and motor conductions so there is no indication of a widespread motor or sensory polyneuropathy.  ------------------------------- Naomie Dean, MD  [facility] [address] [place], [place] [address] Tel: [phone] Fax: [phone]  Verbal informed consent was obtained from the patient, patient was informed of potential risk of procedure, including bruising, bleeding, hematoma formation, infection, muscle weakness, muscle pain, numbness, among others.        MNC    Nerve / Sites Muscle Latency Ref. Amplitude Ref. Rel Amp Segments Distance Velocity Ref. Area    ms ms mV mV %  cm m/s m/s mVms  L Peroneal - EDB     Ankle EDB 4.8 ?6.5 4.7 ?2.0 100 Ankle - EDB 9   15.8     Fib head EDB 11.0  4.4  92.7 Fib  head - Ankle 26 42 ?44 16.8     Pop fossa EDB 13.7  4.8  108 Pop fossa - Fib head 12 44 ?44 16.9         Pop fossa - Ankle      R Peroneal - EDB     Ankle EDB 5.0 ?6.5 3.5 ?2.0 100 Ankle - EDB 9   11.1     Fib head EDB 10.7  3.1  86.8 Fib head - Ankle 25 44 ?44 11.0     Pop fossa EDB 13.2  3.9  125 Pop fossa - Fib head 12 47 ?44 14.0         Pop fossa - Ankle      L Tibial - AH     Ankle AH 5.5 ?5.8 9.0 ?4.0 100 Ankle - AH 9   17.7     Pop fossa AH 15.3  6.8  75.8 Pop fossa - Ankle 42 43 ?41 13.9  R Tibial - AH     Ankle AH 4.7 ?5.8 4.6 ?4.0 100 Ankle - AH 9   14.6     Pop fossa AH 13.6  3.2  69.4 Pop fossa - Ankle 38 43 ?41 14.9             SNC    Nerve / Sites Rec. Site Peak Lat Ref.  Amp Ref. Segments Distance    ms ms V V  cm  L Sural - Ankle (Calf)     Calf Ankle 3.5 ?4.4 8 ?6 Calf -  Ankle 14  R Sural - Ankle (Calf)     Calf Ankle 2.9 ?4.4 3 ?6 Calf - Ankle 14  L Superficial peroneal - Ankle     Lat leg Ankle 4.2 ?4.4 9 ?6 Lat leg - Ankle 14  R Superficial peroneal - Ankle     Lat leg Ankle 4.2 ?4.4 3 ?6 Lat leg - Ankle 14               F  Wave    Nerve F Lat Ref.   ms ms  L Tibial - AH 55.0 ?56.0       EMG Summary Table    Spontaneous MUAP Recruitment  Muscle IA Fib PSW Fasc Other Amp Dur. Poly Pattern  R. Vastus medialis Normal None None None _______ Normal Normal Normal Normal  R. Tibialis anterior Normal None None None _______ Normal Normal Normal Normal  R. Gastrocnemius (Medial head) Normal None None None _______ Normal Normal Normal Normal  R. Abductor hallucis Normal None None None _______ Normal Normal Normal Normal  R. Extensor hallucis longus Normal None None None _______ Normal Normal Normal Normal  R. Abductor digiti minimi (manus) Normal None None None _______ Normal Normal Normal Normal

## 2022-12-29 ENCOUNTER — Encounter: Payer: Self-pay | Admitting: Neurology

## 2023-01-03 ENCOUNTER — Ambulatory Visit: Payer: Medicare Other | Attending: Cardiology | Admitting: Cardiology

## 2023-01-03 ENCOUNTER — Encounter: Payer: Self-pay | Admitting: Cardiology

## 2023-01-03 VITALS — BP 132/86 | HR 72 | Ht 71.0 in | Wt 170.0 lb

## 2023-01-03 DIAGNOSIS — I4819 Other persistent atrial fibrillation: Secondary | ICD-10-CM

## 2023-01-03 DIAGNOSIS — I10 Essential (primary) hypertension: Secondary | ICD-10-CM | POA: Diagnosis not present

## 2023-01-03 NOTE — Progress Notes (Signed)
Electrophysiology Office Follow up Visit Note:    Date:  01/03/2023   ID:  Justin [name], DOB [birth year], MRN [id number]  PCP:  Corwin Levins, MD  [facility] Cardiologist:  Tonny Bollman, MD  [facility] Electrophysiologist:  Lanier Prude, MD    Interval History:    Justin [name] is a 77 y.o. male who presents for a follow up visit.   Last seen November 01, 2022.  He has a history of A-fib on Xarelto with a prior ablation in September 2023.  The veins and posterior wall were isolated during that procedure.  After our last appointment he wore a ZIO monitor which showed a correlation between symptoms and recurrent atrial fibrillation episodes.  He is currently scheduled for redo catheter ablation in June 2024.  Today, he reports that he has been in and out of rhythm intermittently. Last week he had an arrhythmic episode lasting for a couple days. At the time of onset he had been walking his dog when he because acutely short-winded. That same afternoon he felt a sensation of nervousness in his chest along with feeling his heart skipping. At times he has tried taking 1/4 tablet of Xanax which has helped.  He denies any chest pain, or peripheral edema. No lightheadedness, headaches, syncope, orthopnea, or PND.     Past medical, surgical, social and family history were reviewed.  ROS:   Please see the history of present illness.    (+) Shortness of breath (+) Palpitations All other systems reviewed and are negative.  EKGs/Labs/Other Studies Reviewed:    The following studies were reviewed today:   Physical Exam:    VS:  BP 132/86   Pulse 72   Ht 5\' 11"  (1.803 m)   Wt 170 lb (77.1 kg)   SpO2 98%   BMI 23.71 kg/m     Wt Readings from Last 3 Encounters:  01/03/23 170 lb (77.1 kg)  12/22/22 171 lb (77.6 kg)  12/22/22 170 lb (77.1 kg)     GEN:  Well nourished, well developed in no acute distress CARDIAC: RRR, no murmurs, rubs, gallops RESPIRATORY:  Clear to  auscultation without rales, wheezing or rhonchi       ASSESSMENT:    1. Persistent atrial fibrillation (HCC)   2. Primary hypertension    PLAN:    In order of problems listed above:   #Persistent atrial fibrillation Recurrent after September 2023 ablation.  I discussed treatment options with the patient including continued conservative management, addition of an antiarrhythmic drug or redo catheter ablation.  He would like to proceed with catheter ablation.  This is currently scheduled for June 2024.  Discussed treatment options today for AF including antiarrhythmic drug therapy and ablation. Discussed risks, recovery and likelihood of success with each treatment strategy. Risk, benefits, and alternatives to EP study and radiofrequency ablation for afib were discussed. These risks include but are not limited to stroke, bleeding, vascular damage, tamponade, perforation, damage to the esophagus, lungs, and other structures, pulmonary vein stenosis, worsening renal function, and death.  Discussed potential need for repeat ablation procedures and antiarrhythmic drugs after an initial ablation. The patient understands these risk and wishes to proceed.  We will therefore proceed with catheter ablation at the next available time.  Carto, ICE, anesthesia are requested for the procedure.  Will also obtain CT PV protocol prior to the procedure to exclude LAA thrombus and further evaluate atrial anatomy.   #Hypertension At goal today.  Recommend checking  blood pressures 1-2 times per week at home and recording the values.  Recommend bringing these recordings to the primary care physician.     I,Mathew Stumpf,acting as a Neurosurgeon for Lanier Prude, MD.,have documented all relevant documentation on the behalf of Lanier Prude, MD,as directed by  Lanier Prude, MD while in the presence of Lanier Prude, MD.  I, Lanier Prude, MD, have reviewed all documentation for this visit. The  documentation on 01/03/23 for the exam, diagnosis, procedures, and orders are all accurate and complete.   Signed, Steffanie Dunn, MD, [facility], [facility] 01/03/2023 2:09 PM    Electrophysiology [facility]

## 2023-01-03 NOTE — Patient Instructions (Signed)
Medication Instructions:  Your physician recommends that you continue on your current medications as directed. Please refer to the Current Medication list given to you today.  *If you need a refill on your cardiac medications before your next appointment, please call your pharmacy*  Lab Work: BMET and CBC If you have labs (blood work) drawn today and your tests are completely normal, you will receive your results only by: MyChart Message (if you have MyChart) OR A paper copy in the mail If you have any lab test that is abnormal or we need to change your treatment, we will call you to review the results.  Follow-Up: At [facility], you and your health needs are our priority.  As part of our continuing mission to provide you with exceptional heart care, we have created designated Provider Care Teams.  These Care Teams include your primary Cardiologist (physician) and Advanced Practice Providers (APPs -  Physician Assistants and Nurse Practitioners) who all work together to provide you with the care you need, when you need it.  Your next appointment:   We will call you to schedule your follow up appointments.

## 2023-01-03 NOTE — Progress Notes (Deleted)
  Electrophysiology Office Follow up Visit Note:    Date:  01/03/2023   ID:  Justin [name], DOB 1946-07-29, MRN [id number]  PCP:  Corwin Levins, MD  [facility] Cardiologist:  Tonny Bollman, MD  [facility] Electrophysiologist:  Lanier Prude, MD    Interval History:    Justin [name] is a 77 y.o. male who presents for a follow up visit.   Last seen November 01, 2022.  He has a history of A-fib on Xarelto with a prior ablation in September 2023.  The veins and posterior wall were isolated during that procedure.  After our last appointment he wore a ZIO monitor which showed a correlation between symptoms and recurrent atrial fibrillation episodes.  He is currently scheduled for redo catheter ablation in June 2024.       Past medical, surgical, social and family history were reviewed.  ROS:   Please see the history of present illness.    All other systems reviewed and are negative.  EKGs/Labs/Other Studies Reviewed:    The following studies were reviewed today:   Physical Exam:    VS:  There were no vitals taken for this visit.    Wt Readings from Last 3 Encounters:  12/22/22 171 lb (77.6 kg)  12/22/22 170 lb (77.1 kg)  11/01/22 170 lb 6.4 oz (77.3 kg)     GEN: *** Well nourished, well developed in no acute distress CARDIAC: ***RRR, no murmurs, rubs, gallops RESPIRATORY:  Clear to auscultation without rales, wheezing or rhonchi       ASSESSMENT:    No diagnosis found. PLAN:    In order of problems listed above:   #Persistent atrial fibrillation Recurrent after September 2023 ablation.  I discussed treatment options with the patient including continued conservative management, addition of an antiarrhythmic drug or redo catheter ablation.  He would like to proceed with catheter ablation.  This is currently scheduled for June 2024.  Discussed treatment options today for AF including antiarrhythmic drug therapy and ablation. Discussed risks, recovery and  likelihood of success with each treatment strategy. Risk, benefits, and alternatives to EP study and radiofrequency ablation for afib were discussed. These risks include but are not limited to stroke, bleeding, vascular damage, tamponade, perforation, damage to the esophagus, lungs, and other structures, pulmonary vein stenosis, worsening renal function, and death.  Discussed potential need for repeat ablation procedures and antiarrhythmic drugs after an initial ablation. The patient understands these risk and wishes to proceed.  We will therefore proceed with catheter ablation at the next available time.  Carto, ICE, anesthesia are requested for the procedure.  Will also obtain CT PV protocol prior to the procedure to exclude LAA thrombus and further evaluate atrial anatomy.   #Hypertension *** goal today.  Recommend checking blood pressures 1-2 times per week at home and recording the values.  Recommend bringing these recordings to the primary care physician.      Signed, Steffanie Dunn, MD, [facility], [facility] 01/03/2023 5:00 AM    Electrophysiology [facility]

## 2023-01-10 ENCOUNTER — Ambulatory Visit (HOSPITAL_COMMUNITY): Payer: Medicare Other | Admitting: Licensed Clinical Social Worker

## 2023-01-10 DIAGNOSIS — F3162 Bipolar disorder, current episode mixed, moderate: Secondary | ICD-10-CM | POA: Diagnosis not present

## 2023-01-12 ENCOUNTER — Encounter (HOSPITAL_COMMUNITY): Payer: Self-pay | Admitting: Licensed Clinical Social Worker

## 2023-01-12 ENCOUNTER — Encounter: Payer: Self-pay | Admitting: Internal Medicine

## 2023-01-12 ENCOUNTER — Ambulatory Visit: Payer: Medicare Other | Admitting: Internal Medicine

## 2023-01-12 VITALS — BP 138/86 | HR 74 | Temp 97.9°F | Ht 71.0 in | Wt 170.0 lb

## 2023-01-12 DIAGNOSIS — R1013 Epigastric pain: Secondary | ICD-10-CM | POA: Diagnosis not present

## 2023-01-12 DIAGNOSIS — E538 Deficiency of other specified B group vitamins: Secondary | ICD-10-CM

## 2023-01-12 DIAGNOSIS — Z0001 Encounter for general adult medical examination with abnormal findings: Secondary | ICD-10-CM | POA: Diagnosis not present

## 2023-01-12 DIAGNOSIS — R739 Hyperglycemia, unspecified: Secondary | ICD-10-CM

## 2023-01-12 DIAGNOSIS — E559 Vitamin D deficiency, unspecified: Secondary | ICD-10-CM | POA: Diagnosis not present

## 2023-01-12 DIAGNOSIS — R110 Nausea: Secondary | ICD-10-CM | POA: Diagnosis not present

## 2023-01-12 DIAGNOSIS — Z125 Encounter for screening for malignant neoplasm of prostate: Secondary | ICD-10-CM

## 2023-01-12 DIAGNOSIS — E7800 Pure hypercholesterolemia, unspecified: Secondary | ICD-10-CM

## 2023-01-12 LAB — CBC WITH DIFFERENTIAL/PLATELET
Basophils Absolute: 0.1 10*3/uL (ref 0.0–0.1)
Basophils Relative: 1 % (ref 0.0–3.0)
Eosinophils Absolute: 0 10*3/uL (ref 0.0–0.7)
Eosinophils Relative: 0.5 % (ref 0.0–5.0)
HCT: 42.6 % (ref 39.0–52.0)
Hemoglobin: 14.3 g/dL (ref 13.0–17.0)
Lymphocytes Relative: 12.1 % (ref 12.0–46.0)
Lymphs Abs: 0.9 10*3/uL (ref 0.7–4.0)
MCHC: 33.5 g/dL (ref 30.0–36.0)
MCV: 97.7 fl (ref 78.0–100.0)
Monocytes Absolute: 0.5 10*3/uL (ref 0.1–1.0)
Monocytes Relative: 6.8 % (ref 3.0–12.0)
Neutro Abs: 5.6 10*3/uL (ref 1.4–7.7)
Neutrophils Relative %: 79.6 % — ABNORMAL HIGH (ref 43.0–77.0)
Platelets: 222 10*3/uL (ref 150.0–400.0)
RBC: 4.36 Mil/uL (ref 4.22–5.81)
RDW: 12.6 % (ref 11.5–15.5)
WBC: 7 10*3/uL (ref 4.0–10.5)

## 2023-01-12 LAB — BASIC METABOLIC PANEL
BUN: 14 mg/dL (ref 6–23)
CO2: 28 mEq/L (ref 19–32)
Calcium: 9.7 mg/dL (ref 8.4–10.5)
Chloride: 98 mEq/L (ref 96–112)
Creatinine, Ser: 0.86 mg/dL (ref 0.40–1.50)
GFR: 84.03 mL/min (ref >60.00)
Glucose, Bld: 82 mg/dL (ref 70–99)
Potassium: 4.8 mEq/L (ref 3.5–5.1)
Sodium: 134 mEq/L — ABNORMAL LOW (ref 135–145)

## 2023-01-12 LAB — HEPATIC FUNCTION PANEL
ALT: 29 U/L (ref 0–53)
AST: 39 U/L — ABNORMAL HIGH (ref 0–37)
Albumin: 4.3 g/dL (ref 3.5–5.2)
Alkaline Phosphatase: 68 U/L (ref 39–117)
Bilirubin, Direct: 0.3 mg/dL (ref 0.0–0.3)
Total Bilirubin: 1.3 mg/dL — ABNORMAL HIGH (ref 0.2–1.2)
Total Protein: 6.7 g/dL (ref 6.0–8.3)

## 2023-01-12 LAB — MICROALBUMIN / CREATININE URINE RATIO
Creatinine,U: 42.3 mg/dL
Microalb Creat Ratio: 1.7 mg/g (ref 0.0–30.0)
Microalb, Ur: <0.7 mg/dL (ref 0.0–1.9)

## 2023-01-12 LAB — LIPID PANEL
Cholesterol: 133 mg/dL (ref 0–200)
HDL: 73.5 mg/dL (ref >39.00)
LDL Cholesterol: 49 mg/dL (ref 0–99)
NonHDL: 59.36
Total CHOL/HDL Ratio: 2
Triglycerides: 54 mg/dL (ref 0.0–149.0)
VLDL: 10.8 mg/dL (ref 0.0–40.0)

## 2023-01-12 LAB — URINALYSIS, ROUTINE W REFLEX MICROSCOPIC
Bilirubin Urine: NEGATIVE
Ketones, ur: NEGATIVE
Nitrite: NEGATIVE
Specific Gravity, Urine: 1.01 (ref 1.000–1.030)
Total Protein, Urine: NEGATIVE
Urine Glucose: NEGATIVE
Urobilinogen, UA: 0.2 (ref 0.0–1.0)
pH: 6.5 (ref 5.0–8.0)

## 2023-01-12 LAB — H. PYLORI ANTIBODY, IGG: H Pylori IgG: NEGATIVE

## 2023-01-12 LAB — VITAMIN D 25 HYDROXY (VIT D DEFICIENCY, FRACTURES): VITD: 30.47 ng/mL (ref 30.00–100.00)

## 2023-01-12 LAB — TSH: TSH: 0.34 u[IU]/mL — ABNORMAL LOW (ref 0.35–5.50)

## 2023-01-12 LAB — VITAMIN B12: Vitamin B-12: 168 pg/mL — ABNORMAL LOW (ref 211–911)

## 2023-01-12 LAB — PSA: PSA: 3.38 ng/mL (ref 0.10–4.00)

## 2023-01-12 LAB — HEMOGLOBIN A1C: Hgb A1c MFr Bld: 4.8 % (ref 4.6–6.5)

## 2023-01-12 MED ORDER — SUCRALFATE 1 G PO TABS: 1.0000 g | ORAL_TABLET | Freq: Four times a day (QID) | ORAL | 0 refills | Status: DC

## 2023-01-12 MED ORDER — PANTOPRAZOLE SODIUM 40 MG PO TBEC: 40.0000 mg | DELAYED_RELEASE_TABLET | Freq: Every day | ORAL | 3 refills | Status: DC

## 2023-01-12 NOTE — Progress Notes (Signed)
Patient ID: Justin [name], male   DOB: 1946-02-22, 77 y.o.   MRN: [id number]         Chief Complaint:: wellness exam and Nausea (Been going on for 3 weeks , gets sick to his stomach and having some pains as well)  , hyperglycemia, hld, low vit d and low b12       HPI:  WWILLIAM [name] is a 77 y.o. male here for wellness exam; pt will have shingrix at pharmacy, declines colonoscopy, o/w up to date                        Also Pt denies chest pain, increased sob or doe, wheezing, orthopnea, PND, increased LE swelling, palpitations, dizziness or syncope.   Pt denies polydipsia, polyuria, or new focal neuro s/s.    Pt denies fever, wt loss, night sweats, loss of appetite, or other constitutional symptoms  Denies worsening reflux, abd pain, dysphagia, bowel change or blood but has had epigastric pain mild to mod intermittent with nausea , no vomiting or wt loss.  Some better for a short time with antacid.  For cardiac ablation next mo, after cardioversion x 5 per pt   Wt Readings from Last 3 Encounters:  01/12/23 170 lb (77.1 kg)  01/03/23 170 lb (77.1 kg)  12/22/22 171 lb (77.6 kg)   BP Readings from Last 3 Encounters:  01/12/23 138/86  01/03/23 132/86  11/01/22 138/76   Immunization History  Administered Date(s) Administered   Fluad Quad(high Dose 65+) 05/15/2019, 10/12/2021, 07/25/2022   Influenza, High Dose Seasonal PF 05/05/2016, 05/22/2018   Influenza,inj,Quad PF,6+ Mos 06/26/2015   PFIZER(Purple Top)SARS-COV-2 Vaccination 10/24/2019, 11/19/2019, 05/04/2020   Pneumococcal Conjugate-13 08/07/2015, 05/05/2016   Pneumococcal Polysaccharide-23 06/09/2016   Td 03/26/2015   Zoster Recombinat (Shingrix) 10/03/2016   Zoster, Live 03/26/2015   Health Maintenance Due  Topic Date Due   Zoster Vaccines- Shingrix (2 of 2) 11/28/2016      Past Medical History:  Diagnosis Date   Anxiety and depression    Aortic insufficiency 03/11/2022   Echocardiogram 02/2022: EF 70-75, no RWMA, moderate  LVH, normal RVSF, normal PASP (RVSP 20), moderate LAE, mild MR, moderate AI, AV sclerosis without stenosis, no aortic root or ascending aorta dilation   Arthritis    knees, c-spine // gets lumbar ESI q 6 mos   Bipolar disorder (HCC)    CAD (coronary artery disease)    [place] 08/2009 >> LHC ([facility]) - mLAD 70, Dx 65 >> PCI:  3x15 mm Endeavor DES to mLAD and 2.5x12 mm Endeavor DES to Dx // S/p NSTEMI >> LHC 8/12 ([facility]) - LM ok, LAD prox and mid 40; LAD stent ok, Dx stent ok, mRCA 30, mLCx 50 >> med Rx // ETT-Echo 2/17 ([facility]): Normal, EF 55-60 at rest   Chicken pox    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HCC)    Grief    History of echocardiogram    Echo 3/19: Mild concentric LVH, EF 60-65, normal wall motion, grade 1 diastolic dysfunction, mild AI, mildly dilated aortic root (40 mm), MAC, mild LAE, atrial septal lipomatous hypertrophy // Echo 8/22: EF 55-60, no RWMA, mild LVH, GRII DD, normal RVSF, RVSP 40, mild LAE, trivial MR, mild-moderate AI, AV sclerosis without stenosis, mild dilation of aortic root (39 mm)   History of non-ST elevation myocardial infarction (NSTEMI)    History of nuclear stress test    Nuclear stress test 3/19: EF 57, inferior/inferoseptal/inferolateral defect  consistent with probable soft tissue attenuation (cannot exclude subendocardial scar), no ischemia, intermediate risk >> Echo 3/19 normal EF, normal wall motion   History of pneumonia 2011   History of prostatitis 1990s   Hx of adenomatous colonic polyps 06/22/2016   Hyperlipidemia    Hypertension    Migraines    prior history   PTSD (post-traumatic stress disorder)    Past Surgical History:  Procedure Laterality Date   ATRIAL FIBRILLATION ABLATION N/A 05/17/2022   Procedure: ATRIAL FIBRILLATION ABLATION;  Surgeon: Lanier Prude, MD;  Location: [facility] INVASIVE CV LAB;  Service: Cardiovascular;  Laterality: N/A;   CARDIOVERSION N/A 08/05/2021   Procedure: CARDIOVERSION;  Surgeon: Elease Hashimoto Deloris Ping, MD;  Location:  [facility] ENDOSCOPY;  Service: Cardiovascular;  Laterality: N/A;   CARDIOVERSION N/A 07/28/2022   Procedure: CARDIOVERSION;  Surgeon: Chrystie Nose, MD;  Location: [facility] ENDOSCOPY;  Service: Cardiovascular;  Laterality: N/A;   CARDIOVERSION N/A 09/23/2022   Procedure: CARDIOVERSION;  Surgeon: Parke Poisson, MD;  Location: [facility] ENDOSCOPY;  Service: Cardiovascular;  Laterality: N/A;   CERVICAL DISCECTOMY     COLONOSCOPY  03/2017   ELBOW SURGERY Left    ELBOW SURGERY Right    INGUINAL HERNIA REPAIR Bilateral    1996, 1997   KNEE ARTHROSCOPY Right    KNEE CARTILAGE SURGERY Left    NASAL SEPTUM SURGERY     STENT PLACEMENT VASCULAR ([facility] HX)  09/02/2010   TONSILLECTOMY     TOTAL KNEE ARTHROPLASTY Left 08/31/2018   Procedure: TOTAL KNEE ARTHROPLASTY;  Surgeon: Jodi Geralds, MD;  Location: [facility] ORS;  Service: Orthopedics;  Laterality: Left;    reports that he has never smoked. He has never used smokeless tobacco. He reports that he does not currently use alcohol after a past usage of about 6.0 standard drinks of alcohol per week. He reports that he does not use drugs. family history includes Alzheimer's disease in his mother; Arthritis in his father and mother; Depression in his brother; Diabetes in his sister and sister; Heart attack in his father, mother, and sister; Heart disease in his father and mother; Hyperlipidemia in his father; Hypertension in his father and mother; Lung cancer in his maternal aunt, paternal aunt, and paternal grandmother; Multiple sclerosis in his sister; Pulmonary embolism in his sister; Stroke in his father. Allergies  Allergen Reactions   Ambien [Zolpidem Tartrate] Other (See Comments)    Blackout, memory issues   Codeine     Causes Memory issues    Amiodarone     dizziness   Ativan [Lorazepam] Other (See Comments)    Made him feel completely out of it fell down   Amitriptyline Palpitations   Seroquel [Quetiapine]     SEVERE NIGHTMARES, SLEEPWALK AND NIGHT DRIVE WITH NO  RECOLLECTION UPON WAKENING   Current Outpatient Medications on File Prior to Visit  Medication Sig Dispense Refill   ALPRAZolam (XANAX) 1 MG tablet TAKE 1/2 TO 1 TABLET BY MOUTH ONCE DAILY 30 tablet 2   diltiazem (CARDIZEM CD) 180 MG 24 hr capsule TAKE 1 CAPSULE BY MOUTH DAILY 30 capsule 11   gabapentin (NEURONTIN) 300 MG capsule Take one in AM and 2 in PM. 90 capsule 11   isosorbide mononitrate (IMDUR) 30 MG 24 hr tablet TAKE 1 TABLET BY MOUTH DAILY 30 tablet 0   lamoTRIgine (LAMICTAL) 200 MG tablet Take 1 tablet (200 mg total) by mouth daily. 90 tablet 0   nitroGLYCERIN (NITROSTAT) 0.4 MG SL tablet Place 1 tablet (0.4 mg total) under  the tongue every 5 (five) minutes as needed for chest pain. 25 tablet 3   rivaroxaban (XARELTO) 20 MG TABS tablet Take 1 tablet (20 mg total) by mouth daily. 30 tablet 11   rosuvastatin (CRESTOR) 20 MG tablet TAKE 1 TABLET BY MOUTH DAILY 30 tablet 0   No current facility-administered medications on file prior to visit.        ROS:  All others reviewed and negative.  Objective        PE:  BP 138/86 (BP Location: Right Arm, Patient Position: Sitting, Cuff Size: Normal)   Pulse 74   Temp 97.9 F (36.6 C) (Oral)   Ht 5\' 11"  (1.803 m)   Wt 170 lb (77.1 kg)   SpO2 99%   BMI 23.71 kg/m                 Constitutional: Pt appears in NAD               HENT: Head: NCAT.                Right Ear: External ear normal.                 Left Ear: External ear normal.                Eyes: . Pupils are equal, round, and reactive to light. Conjunctivae and EOM are normal               Nose: without d/c or deformity               Neck: Neck supple. Gross normal ROM               Cardiovascular: Normal rate and regular rhythm.                 Pulmonary/Chest: Effort normal and breath sounds without rales or wheezing.                Abd:  Soft, mild epigastric tender, ND, + BS, no organomegaly               Neurological: Pt is alert. At baseline orientation, motor  grossly intact               Skin: Skin is warm. No rashes, no other new lesions, LE edema - none               Psychiatric: Pt behavior is normal without agitation   Micro: none  Cardiac tracings I have personally interpreted today:  none  Pertinent Radiological findings (summarize): none   Lab Results  Component Value Date   WBC 7.0 01/12/2023   HGB 14.3 01/12/2023   HCT 42.6 01/12/2023   PLT 222.0 01/12/2023   GLUCOSE 82 01/12/2023   CHOL 133 01/12/2023   TRIG 54.0 01/12/2023   HDL 73.50 01/12/2023   LDLCALC 49 01/12/2023   ALT 29 01/12/2023   AST 39 (H) 01/12/2023   NA 134 (L) 01/12/2023   K 4.8 01/12/2023   CL 98 01/12/2023   CREATININE 0.86 01/12/2023   BUN 14 01/12/2023   CO2 28 01/12/2023   TSH 0.34 (L) 01/12/2023   PSA 3.38 01/12/2023   INR 0.95 08/21/2018   HGBA1C 4.8 01/12/2023   MICROALBUR <0.7 01/12/2023   Assessment/Plan:  LOTUS MCCLENAGHAN is a 77 y.o. White or Caucasian [1] male with  has a past medical history of Anxiety and depression, Aortic insufficiency (03/11/2022), Arthritis, Bipolar  disorder (HCC), CAD (coronary artery disease), Chicken pox, 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HCC), Grief, History of echocardiogram, History of non-ST elevation myocardial infarction (NSTEMI), History of nuclear stress test, History of pneumonia (2011), History of prostatitis (1990s), adenomatous colonic polyps (06/22/2016), Hyperlipidemia, Hypertension, Migraines, and PTSD (post-traumatic stress disorder).  Encounter for well adult exam with abnormal findings Age and sex appropriate education and counseling updated with regular exercise and diet Referrals for preventative services - for colonoscopy Immunizations addressed - for shignrix at pharmacy Smoking counseling  - none needed Evidence for depression or other mood disorder - stable bipolar anxiety Most recent labs reviewed. I have personally reviewed and have noted: 1) the patient's medical and social history 2) The patient's  current medications and supplements 3) The patient's height, weight, and BMI have been recorded in the chart   Hyperglycemia Lab Results  Component Value Date   HGBA1C 4.8 01/12/2023   Stable, pt to continue current medical treatment - diet, wt control   Hyperlipidemia Lab Results  Component Value Date   LDLCALC 49 01/12/2023   Stable, pt to continue current statin crestor 20 qd   Vitamin D deficiency Last vitamin D Lab Results  Component Value Date   VD25OH 30.47 01/12/2023   Low, to start oral replacement   Nausea For zofran prn  Dyspepsia Likely due to gastritis - for protonix 40 qd and carafate qid for 1 mo, declines GI referral for now  B12 deficiency Lab Results  Component Value Date   VITAMINB12 168 (L) 01/12/2023   Low to start oral replacement - b12 1000 mcg qd  Followup: Return in about 6 months (around 07/15/2023).  Oliver Barre, MD 01/14/2023 7:26 PM [facility] - [facility]

## 2023-01-12 NOTE — Progress Notes (Signed)
   THERAPIST PROGRESS NOTE  Session Time: 2:30pm-3:20pm  Participation Level: Active  Behavioral Response: Well GroomedAlertEuthymic  Type of Therapy: Individual Therapy  Treatment Goals addressed: LTG: Reduce frequency, intensity, and duration of depression symptoms so that daily functioning is improved   ProgressTowards Goals: Progressing  Interventions: CBT  Summary: Justin [name] is a 77 y.o. male who presents with Bipolar I disorder, mixed.   Suicidal/Homicidal: Nowithout intent/plan  Therapist Response: [name] engaged well in individual session with clinician. Clinician utilized CBT to process thoughts, feelings, and interactions. Justin [name] shared some improvements in interactions with family and friends. Clinician reflected ongoing challenges knowing what to expect from his son and his sister, as they flip flop on their attitudes toward him. Clinician identified that there may not be a way to predict their attitudes, but there may be a way for Justin [name] to protect himself and guard himself with emotional boundaries. Clinician processed ways to establish and maintain his boundaries as self-protection. Clinician also identified the importance of being able to say no and set boundaries with neighbors. Clinician noted that a lot is asked of Justin [name] in helping them at their house. Clinician identified that he has the right to say no, as well as the right to not answer the phone if they call too early or too late. Clinician encouraged him to share his "hours of operation"- 8am-8pm, for example.   Plan: Return again in 2 weeks.  Diagnosis: Bipolar 1 disorder, mixed, moderate (HCC)  Collaboration of Care: Patient refused AEB none required  Patient/Guardian was advised Release of Information must be obtained prior to any record release in order to collaborate their care with an outside provider. Patient/Guardian was advised if they have not already done so to contact the registration department to sign  all necessary forms in order for [place] to release information regarding their care.   Consent: Patient/Guardian gives verbal consent for treatment and assignment of benefits for services provided during this visit. Patient/Guardian expressed understanding and agreed to proceed.   Chryl Heck [place], LCSW 01/12/2023

## 2023-01-12 NOTE — Patient Instructions (Signed)
Please take all new medication as prescribed -the protonix 40 mg per day for stomach acid, and the carafate for 1 month only  - for dyspepsia likely due to gastritis of some kind  Please continue all other medications as before, and refills have been done if requested.  Please have the pharmacy call with any other refills you may need.  Please continue your efforts at being more active, low cholesterol diet, and weight control.  You are otherwise up to date with prevention measures today.  Please keep your appointments with your specialists as you may have planned- heart ablation in June  Please go to the LAB at the blood drawing area for the tests to be done  You will be contacted by phone if any changes need to be made immediately.  Otherwise, you will receive a letter about your results with an explanation, but please check with MyChart first.  Please remember to sign up for MyChart if you have not done so, as this will be important to you in the future with finding out test results, communicating by private email, and scheduling acute appointments online when needed.  Please make an Appointment to return in 6 months, or sooner if needed

## 2023-01-14 ENCOUNTER — Encounter: Payer: Self-pay | Admitting: Internal Medicine

## 2023-01-14 NOTE — Assessment & Plan Note (Signed)
For zofran prn 

## 2023-01-14 NOTE — Assessment & Plan Note (Signed)
Likely due to gastritis - for protonix 40 qd and carafate qid for 1 mo, declines GI referral for now

## 2023-01-14 NOTE — Assessment & Plan Note (Signed)
Lab Results  Component Value Date   HGBA1C 4.8 01/12/2023   Stable, pt to continue current medical treatment - diet, wt control

## 2023-01-14 NOTE — Assessment & Plan Note (Signed)
Lab Results  Component Value Date   LDLCALC 49 01/12/2023   Stable, pt to continue current statin crestor 20 qd

## 2023-01-14 NOTE — Assessment & Plan Note (Signed)
Age and sex appropriate education and counseling updated with regular exercise and diet Referrals for preventative services - for colonoscopy Immunizations addressed - for shignrix at pharmacy Smoking counseling  - none needed Evidence for depression or other mood disorder - stable bipolar anxiety Most recent labs reviewed. I have personally reviewed and have noted: 1) the patient's medical and social history 2) The patient's current medications and supplements 3) The patient's height, weight, and BMI have been recorded in the chart

## 2023-01-14 NOTE — Assessment & Plan Note (Signed)
Last vitamin D Lab Results  Component Value Date   VD25OH 30.47 01/12/2023   Low, to start oral replacement

## 2023-01-14 NOTE — Assessment & Plan Note (Signed)
Lab Results  Component Value Date   VITAMINB12 168 (L) 01/12/2023   Low to start oral replacement - b12 1000 mcg qd

## 2023-01-17 ENCOUNTER — Telehealth: Payer: Self-pay | Admitting: Neurology

## 2023-01-17 NOTE — Telephone Encounter (Signed)
Please have patient increase Gabapentin to 2 tablets twice daily.

## 2023-01-17 NOTE — Telephone Encounter (Signed)
Pt stated he needs to talk to nurse. Stated he is still having numbness in the feet.

## 2023-01-17 NOTE — Telephone Encounter (Signed)
Called patient an he stated that he is still having sum pain and a lot of numbness in his right toes, ankle and calf. Patient states he feels like he walking on a ball. Patient states he has Rx gabapentin and it not working. Patient would like to know what we could do to help with his issue.

## 2023-01-18 NOTE — Telephone Encounter (Signed)
Called patient and in formed him that the work in Dr Teresa Coombs has approve for him to increase his Gabapentin to two tablet, twice a day. I advise patient to give it a two weeks and see how he feels. Pt verbalized understanding. Pt had no questions at this time but was encouraged to call back if questions arise.

## 2023-01-24 ENCOUNTER — Other Ambulatory Visit: Payer: Self-pay | Admitting: Cardiovascular Disease

## 2023-01-26 ENCOUNTER — Other Ambulatory Visit: Payer: Self-pay | Admitting: Cardiovascular Disease

## 2023-01-30 ENCOUNTER — Ambulatory Visit: Payer: Medicare Other | Attending: Cardiovascular Disease

## 2023-01-30 ENCOUNTER — Telehealth: Payer: Self-pay | Admitting: *Deleted

## 2023-01-30 DIAGNOSIS — I4819 Other persistent atrial fibrillation: Secondary | ICD-10-CM

## 2023-01-30 MED ORDER — NITROGLYCERIN 0.4 MG SL SUBL: 0.4000 mg | SUBLINGUAL_TABLET | SUBLINGUAL | 3 refills | Status: DC | PRN

## 2023-01-30 NOTE — Telephone Encounter (Signed)
Patient here for lab work and reported to lab tech that he had episode on Friday and is feeling tired. I spoke with patient who reports around 5 AM on June 7 he had cramping feeling on the left side of his chest near his heart. Described also as feeling like squeezing.  Also had left arm numbness.  He took one NTG and symptoms resolved. He felt tired over the weekend but did not have any more pain/squeezing.  Today he had one episode of sharp pain that felt like a pin stabbing him in his chest.  Lasted a second or two.  He continues to feel very tired. Also reports the weekend prior (June 1-2) he felt very tired.  Thought he might be out of rhythm at times last week.  Currently he only has skips every now and then. Reviewed with Dr Lalla Brothers and patient should have office visit for evaluation.  Possible stress test vs cath prior to ablation.  Appointment made for patient to see Jacolyn Reedy, PA tomorrow at 12:15.   ED precautions reviewed with patient.  Patient thinks his NTG may be old.  New prescription sent to [facility].

## 2023-01-30 NOTE — Progress Notes (Unsigned)
Cardiology Office Note:    Date:  01/31/2023   ID:  Justin [name], DOB 1946/08/06, MRN [id number]  PCP:  Corwin Levins, MD  [facility] Providers Cardiologist:  Tonny Bollman, MD Cardiology APP:  Beatrice Lecher, PA-C  Electrophysiologist:  Lanier Prude, MD     Referring MD: Corwin Levins, MD   Chief Complaint:  Chest Pain     History of Present Illness:   Justin [name] is a 77 y.o. male with  history of HTN, HLD, anxiety/depression, CAD DES LAD and Dx 2011, NSTEMI 2012 no culprit on cath, NST 2019 & 2022 no ischemia,  Afib on Xarelto s/p ablation 04/2022, recent zio with recurrent Afib for redo ablation 02/14/2023.  CAC score 3865 04/2022  Patient come into the office for labs yesterday and noted he was having chest pain over the weekend. Dr. Lalla Brothers recommended OV and ischemic w/u prior to ablation.  Patient said last Friday 5 am he had a gripping chest pain squeezing that would come and go. He took a NTG and it went away. He had a similar episode a year ago. Sunday he took his dog for a walk and got short of breath and heart started racing and felt like he was in Afib. No chest tightness when in Afib. Last summer if he overexerts himself he gets chest pressure and had to slow down. Last Monday he cut his grass and his neighbors 4-5 hrs without a problem.          Past Medical History:  Diagnosis Date   Anxiety and depression    Aortic insufficiency 03/11/2022   Echocardiogram 02/2022: EF 70-75, no RWMA, moderate LVH, normal RVSF, normal PASP (RVSP 20), moderate LAE, mild MR, moderate AI, AV sclerosis without stenosis, no aortic root or ascending aorta dilation   Arthritis    knees, c-spine // gets lumbar ESI q 6 mos   Bipolar disorder (HCC)    CAD (coronary artery disease)    [place] 08/2009 >> LHC ([facility]) - mLAD 70, Dx 65 >> PCI:  3x15 mm Endeavor DES to mLAD and 2.5x12 mm Endeavor DES to Dx // S/p NSTEMI >> LHC 8/12 ([facility]) - LM ok, LAD prox and mid 40;  LAD stent ok, Dx stent ok, mRCA 30, mLCx 50 >> med Rx // ETT-Echo 2/17 ([facility]): Normal, EF 55-60 at rest   Chicken pox    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HCC)    Grief    History of echocardiogram    Echo 3/19: Mild concentric LVH, EF 60-65, normal wall motion, grade 1 diastolic dysfunction, mild AI, mildly dilated aortic root (40 mm), MAC, mild LAE, atrial septal lipomatous hypertrophy // Echo 8/22: EF 55-60, no RWMA, mild LVH, GRII DD, normal RVSF, RVSP 40, mild LAE, trivial MR, mild-moderate AI, AV sclerosis without stenosis, mild dilation of aortic root (39 mm)   History of non-ST elevation myocardial infarction (NSTEMI)    History of nuclear stress test    Nuclear stress test 3/19: EF 57, inferior/inferoseptal/inferolateral defect consistent with probable soft tissue attenuation (cannot exclude subendocardial scar), no ischemia, intermediate risk >> Echo 3/19 normal EF, normal wall motion   History of pneumonia 2011   History of prostatitis 1990s   Hx of adenomatous colonic polyps 06/22/2016   Hyperlipidemia    Hypertension    Migraines    prior history   PTSD (post-traumatic stress disorder)    Current Medications: Current Meds  Medication Sig   ALPRAZolam (  XANAX) 1 MG tablet TAKE 1/2 TO 1 TABLET BY MOUTH ONCE DAILY   diltiazem (CARDIZEM CD) 180 MG 24 hr capsule TAKE 1 CAPSULE BY MOUTH DAILY   isosorbide mononitrate (IMDUR) 30 MG 24 hr tablet TAKE 1 TABLET BY MOUTH DAILY   lamoTRIgine (LAMICTAL) 200 MG tablet Take 1 tablet (200 mg total) by mouth daily.   nitroGLYCERIN (NITROSTAT) 0.4 MG SL tablet Place 1 tablet (0.4 mg total) under the tongue every 5 (five) minutes as needed for chest pain.   pantoprazole (PROTONIX) 40 MG tablet Take 1 tablet (40 mg total) by mouth daily.   rivaroxaban (XARELTO) 20 MG TABS tablet Take 1 tablet (20 mg total) by mouth daily.   rosuvastatin (CRESTOR) 20 MG tablet TAKE 1 TABLET BY MOUTH DAILY   sucralfate (CARAFATE) 1 g tablet Take 1 tablet (1 g total) by  mouth 4 (four) times daily.    Allergies:   Ambien [zolpidem tartrate], Codeine, Amiodarone, Ativan [lorazepam], Amitriptyline, and Seroquel [quetiapine]   Social History   Tobacco Use   Smoking status: Never   Smokeless tobacco: Never   Tobacco comments:    Never smoke 06/14/22  Vaping Use   Vaping Use: Never used  Substance Use Topics   Alcohol use: Not Currently    Alcohol/week: 6.0 standard drinks of alcohol    Types: 6 Cans of beer per week    Comment: 3 beers in a sitting   Drug use: No    Family Hx: The patient's family history includes Alzheimer's disease in his mother; Arthritis in his father and mother; Depression in his brother; Diabetes in his sister and sister; Heart attack in his father, mother, and sister; Heart disease in his father and mother; Hyperlipidemia in his father; Hypertension in his father and mother; Lung cancer in his maternal aunt, paternal aunt, and paternal grandmother; Multiple sclerosis in his sister; Pulmonary embolism in his sister; Stroke in his father.  ROS     Physical Exam:    VS:  BP 132/76   Pulse 72   Ht 5\' 11"  (1.803 m)   Wt 168 lb 3.2 oz (76.3 kg)   SpO2 98%   BMI 23.46 kg/m     Wt Readings from Last 3 Encounters:  01/31/23 168 lb 3.2 oz (76.3 kg)  01/12/23 170 lb (77.1 kg)  01/03/23 170 lb (77.1 kg)    Physical Exam  GEN: Thin, in no acute distress  Neck: no JVD, carotid bruits, or masses Cardiac:RRR; no murmurs, rubs, or gallops  Respiratory:  clear to auscultation bilaterally, normal work of breathing GI: soft, nontender, nondistended, + BS Ext: without cyanosis, clubbing, or edema, Good distal pulses bilaterally Neuro:  Alert and Oriented x 3 Psych: euthymic mood, full affect        EKGs/Labs/Other Test Reviewed:    EKG:  EKG is   ordered today.  The ekg ordered today demonstrates NSR IRBBB, LAFB  Recent Labs: 05/18/2022: Magnesium 2.3 01/12/2023: ALT 29; TSH 0.34 01/30/2023: BUN 9; Creatinine, Ser 0.79;  Hemoglobin 12.4; Platelets 237; Potassium 4.3; Sodium 137   Recent Lipid Panel Recent Labs    01/12/23 1139  CHOL 133  TRIG 54.0  HDL 73.50  VLDL 10.8  LDLCALC 49     Prior CV Studies: GATED SPECT MYO PERF W/LEXISCAN STRESS 1D 03/11/2021  Narrative  Lexiscan stress is electrically negative for ischemia  Myoview scan shows probable normal perfusion and soft tissue attenuation (diaphragm, bowel activity) No significant ischemia or scar  LVEF calculated  at 64%  Low risk study.   ECHO COMPLETE WO IMAGING ENHANCING AGENT 11/17/2022  Narrative ECHOCARDIOGRAM REPORT    Patient Name:   Justin [name] Date of Exam: 11/17/2022 Medical Rec #:  [id number]      Height:       71.0 in Accession #:    [id number]     Weight:       170.4 lb Date of Birth:  11-16-1945      BSA:          1.970 m Patient Age:    76 years       BP:           138/76 mmHg Patient Gender: M              HR:           78 bpm. Exam Location:  [address]  Procedure: 2D Echo, Cardiac Doppler, Color Doppler and Strain Analysis  Indications:    R06.02 SOB  History:        Patient has prior history of Echocardiogram examinations, most recent 03/10/2022. Previous Myocardial Infarction and CAD, Arrythmias:Atrial Fibrillation, Signs/Symptoms:Shortness of Breath; Risk Factors:Hypertension and Dyslipidemia.  Sonographer:    Samule Ohm RDCS Referring Phys: [id number] Rossie Muskrat LAMBERT  IMPRESSIONS   1. Left ventricular ejection fraction, by estimation, is 55 to 60%. The left ventricle has normal function. The left ventricle has no regional wall motion abnormalities. Left ventricular diastolic parameters are consistent with Grade I diastolic dysfunction (impaired relaxation). 2. Right ventricular systolic function is normal. The right ventricular size is normal. 3. No evidence of mitral valve regurgitation. 4. The aortic valve is grossly normal. Aortic valve regurgitation is moderate. 5. Aneurysm of the  aortic root, measuring 38 mm.  Conclusion(s)/Recommendation(s): MR has improved compared to prior study.  FINDINGS Left Ventricle: Left ventricular ejection fraction, by estimation, is 55 to 60%. The left ventricle has normal function. The left ventricle has no regional wall motion abnormalities. The left ventricular internal cavity size was normal in size. There is no left ventricular hypertrophy. Left ventricular diastolic parameters are consistent with Grade I diastolic dysfunction (impaired relaxation).  Right Ventricle: The right ventricular size is normal. Right ventricular systolic function is normal.  Left Atrium: Left atrial size was normal in size.  Right Atrium: Right atrial size was normal in size.  Pericardium: There is no evidence of pericardial effusion.  Mitral Valve: No evidence of mitral valve regurgitation.  Tricuspid Valve: Tricuspid valve regurgitation is mild.  Aortic Valve: The aortic valve is grossly normal. Aortic valve regurgitation is moderate. Aortic regurgitation PHT measures 667 msec.  Aorta: There is an aneurysm involving the aortic root measuring 38 mm.  IAS/Shunts: No atrial level shunt detected by color flow Doppler.   LEFT VENTRICLE PLAX 2D LVIDd:         4.50 cm   Diastology LVIDs:         2.70 cm   LV e' medial:    5.98 cm/s LV PW:         0.90 cm   LV E/e' medial:  9.8 LV IVS:        0.90 cm   LV e' lateral:   10.20 cm/s LVOT diam:     2.00 cm   LV E/e' lateral: 5.8 LV SV:         64 LV SV Index:   33        2D Longitudinal Strain LVOT Area:  3.14 cm  2D Strain GLS (A2C):   16.7 % 2D Strain GLS (A3C):   16.2 % 2D Strain GLS (A4C):   18.2 % 2D Strain GLS Avg:     17.0 %  RIGHT VENTRICLE             IVC RV S prime:     14.70 cm/s  IVC diam: 0.70 cm TAPSE (M-mode): 2.3 cm RVSP:           28.2 mmHg  LEFT ATRIUM             Index        RIGHT ATRIUM           Index LA diam:        4.10 cm 2.08 cm/m   RA Pressure: 3.00 mmHg LA Vol  (A2C):   55.8 ml 28.33 ml/m  RA Area:     14.90 cm LA Vol (A4C):   55.0 ml 27.92 ml/m  RA Volume:   35.80 ml  18.17 ml/m LA Biplane Vol: 57.6 ml 29.24 ml/m AORTIC VALVE LVOT Vmax:   114.00 cm/s LVOT Vmean:  69.200 cm/s LVOT VTI:    0.205 m AI PHT:      667 msec  AORTA Ao Root diam: 3.80 cm Ao Asc diam:  3.70 cm  MITRAL VALVE               TRICUSPID VALVE MV Area (PHT): 2.56 cm    TR Peak grad:   25.2 mmHg MV Decel Time: 296 msec    TR Vmax:        251.00 cm/s MV E velocity: 58.70 cm/s  Estimated RAP:  3.00 mmHg MV A velocity: 68.70 cm/s  RVSP:           28.2 mmHg MV E/A ratio:  0.85 SHUNTS Systemic VTI:  0.20 m Systemic Diam: 2.00 cm  Carolan Clines Electronically signed by Carolan Clines Signature Date/Time: 11/17/2022/3:30:06 PM    Final   LONG TERM MONITOR (8-14 DAYS) INTERPRETATION 11/25/2022  Narrative HR 52 - 146 bpm, average 77 bpm. 1 nonsustained VT lasting 6 beats. 7 nonsustained SVT, longest 9 beats. 4% burden of AF, average rate 71 bpm. Symptom trigger episode corresponds to AF. Rare supraventricular and ventricular ectopy.  Sheria Lang T. Lalla Brothers, MD, [facility], [facility] Cardiac Electrophysiology   No results found for this or any previous visit from the past 3650 days.   No results found for this or any previous visit from the past 3650 days.   No valid procedures specified.       Risk Assessment/Calculations/Metrics:    CHA2DS2-VASc Score = 4   This indicates a 4.8% annual risk of stroke. The patient's score is based upon: CHF History: 0 HTN History: 1 Diabetes History: 0 Stroke History: 0 Vascular Disease History: 1 Age Score: 2 Gender Score: 0             ASSESSMENT & PLAN:   No problem-specific Assessment & Plan notes found for this encounter.   Chest pain-awakened him from sleep and relieved with NTG. No exertional symptoms. With histroy of CAD and upcoming Afib ablation Dr. Lalla Brothers wanted an ischemic w/u. Will order lexi myoview. Patient  agreeable.   CAD DES LAD and Dx 2011, NSTEMI 2012 no culprit oon cath, NST 2019 & 2022 no ischemia,CAC score 3865 04/2022-repeat lexi  Afib on Xarelto s/p ablation 04/2022, recent zio with recurrent Afib for redo ablation 01/2023  HTN controlled  HLD LDL 49 12/2022  on crestor        Informed Consent   Shared Decision Making/Informed Consent The risks [chest pain, shortness of breath, cardiac arrhythmias, dizziness, blood pressure fluctuations, myocardial infarction, stroke/transient ischemic attack, nausea, vomiting, allergic reaction, radiation exposure, metallic taste sensation and life-threatening complications (estimated to be 1 in 10,000)], benefits (risk stratification, diagnosing coronary artery disease, treatment guidance) and alternatives of a nuclear stress test were discussed in detail with Justin [name] and he agrees to proceed.      Dispo:  No follow-ups on file.   Medication Adjustments/Labs and Tests Ordered: Current medicines are reviewed at length with the patient today.  Concerns regarding medicines are outlined above.  Tests Ordered: No orders of the defined types were placed in this encounter.  Medication Changes: No orders of the defined types were placed in this encounter.  Elson Clan, PA-C  01/31/2023 12:08 PM    [facility] [address] [place], [place], [place]  [address] Phone: [phone]; Fax: [phone]

## 2023-01-31 ENCOUNTER — Encounter: Payer: Self-pay | Admitting: Physician Assistant

## 2023-01-31 ENCOUNTER — Ambulatory Visit (HOSPITAL_COMMUNITY): Payer: Medicare Other | Admitting: Licensed Clinical Social Worker

## 2023-01-31 ENCOUNTER — Ambulatory Visit: Payer: Medicare Other | Attending: Physician Assistant | Admitting: Physician Assistant

## 2023-01-31 VITALS — BP 132/76 | HR 72 | Ht 71.0 in | Wt 168.2 lb

## 2023-01-31 DIAGNOSIS — I10 Essential (primary) hypertension: Secondary | ICD-10-CM

## 2023-01-31 DIAGNOSIS — R079 Chest pain, unspecified: Secondary | ICD-10-CM | POA: Diagnosis not present

## 2023-01-31 DIAGNOSIS — I4819 Other persistent atrial fibrillation: Secondary | ICD-10-CM

## 2023-01-31 DIAGNOSIS — F3162 Bipolar disorder, current episode mixed, moderate: Secondary | ICD-10-CM | POA: Diagnosis not present

## 2023-01-31 DIAGNOSIS — E7849 Other hyperlipidemia: Secondary | ICD-10-CM

## 2023-01-31 DIAGNOSIS — I2510 Atherosclerotic heart disease of native coronary artery without angina pectoris: Secondary | ICD-10-CM | POA: Diagnosis not present

## 2023-01-31 DIAGNOSIS — I2584 Coronary atherosclerosis due to calcified coronary lesion: Secondary | ICD-10-CM

## 2023-01-31 LAB — CBC WITH DIFFERENTIAL/PLATELET
Basophils Absolute: 0 10*3/uL (ref 0.0–0.2)
Basos: 0 %
EOS (ABSOLUTE): 0.1 10*3/uL (ref 0.0–0.4)
Eos: 1 %
Hematocrit: 37.9 % (ref 37.5–51.0)
Hemoglobin: 12.4 g/dL — ABNORMAL LOW (ref 13.0–17.7)
Immature Grans (Abs): 0 10*3/uL (ref 0.0–0.1)
Immature Granulocytes: 0 %
Lymphocytes Absolute: 0.9 10*3/uL (ref 0.7–3.1)
Lymphs: 11 %
MCH: 31.7 pg (ref 26.6–33.0)
MCHC: 32.7 g/dL (ref 31.5–35.7)
MCV: 97 fL (ref 79–97)
Monocytes Absolute: 0.4 10*3/uL (ref 0.1–0.9)
Monocytes: 5 %
Neutrophils Absolute: 6.3 10*3/uL (ref 1.4–7.0)
Neutrophils: 83 %
Platelets: 237 10*3/uL (ref 150–450)
RBC: 3.91 x10E6/uL — ABNORMAL LOW (ref 4.14–5.80)
RDW: 11.7 % (ref 11.6–15.4)
WBC: 7.6 10*3/uL (ref 3.4–10.8)

## 2023-01-31 LAB — BASIC METABOLIC PANEL
BUN/Creatinine Ratio: 11 (ref 10–24)
BUN: 9 mg/dL (ref 8–27)
CO2: 24 mmol/L (ref 20–29)
Calcium: 9.3 mg/dL (ref 8.6–10.2)
Chloride: 103 mmol/L (ref 96–106)
Creatinine, Ser: 0.79 mg/dL (ref 0.76–1.27)
Glucose: 89 mg/dL (ref 70–99)
Potassium: 4.3 mmol/L (ref 3.5–5.2)
Sodium: 137 mmol/L (ref 134–144)
eGFR: 92 mL/min/{1.73_m2} (ref >59)

## 2023-01-31 NOTE — Patient Instructions (Addendum)
Medication Instructions:   Your physician recommends that you continue on your current medications as directed. Please refer to the Current Medication list given to you today.   *If you need a refill on your cardiac medications before your next appointment, please call your pharmacy*   Lab Work: NONE ORDERED  TODAY   If you have labs (blood work) drawn today and your tests are completely normal, you will receive your results only by: MyChart Message (if you have MyChart) OR A paper copy in the mail If you have any lab test that is abnormal or we need to change your treatment, we will call you to review the results.   Testing/Procedures:  BEFORE 6-25 ABLATION Your physician has requested that you have a lexiscan myoview. For further information please visit [email or web address] Please follow instruction sheet, as given.     Follow-Up: At [facility], you and your health needs are our priority.  As part of our continuing mission to provide you with exceptional heart care, we have created designated Provider Care Teams.  These Care Teams include your primary Cardiologist (physician) and Advanced Practice Providers (APPs -  Physician Assistants and Nurse Practitioners) who all work together to provide you with the care you need, when you need it.  We recommend signing up for the patient portal called "MyChart".  Sign up information is provided on this After Visit Summary.  MyChart is used to connect with patients for Virtual Visits (Telemedicine).  Patients are able to view lab/test results, encounter notes, upcoming appointments, etc.  Non-urgent messages can be sent to your provider as well.   To learn more about what you can do with MyChart, go to [email or web address].    Your next appointment:    6 month(s)  Provider:    Tonny Bollman, MD       Other Instructions

## 2023-02-01 ENCOUNTER — Telehealth (HOSPITAL_COMMUNITY): Payer: Self-pay | Admitting: *Deleted

## 2023-02-01 NOTE — Telephone Encounter (Signed)
Left a detailed about Stress Test for 02/02/23 at 10:15. Also left a call back number if he had questions or needed to cancel.

## 2023-02-02 ENCOUNTER — Encounter (HOSPITAL_COMMUNITY): Payer: Self-pay | Admitting: Licensed Clinical Social Worker

## 2023-02-02 ENCOUNTER — Ambulatory Visit (HOSPITAL_COMMUNITY): Payer: Medicare Other | Attending: Cardiology

## 2023-02-02 DIAGNOSIS — R079 Chest pain, unspecified: Secondary | ICD-10-CM | POA: Insufficient documentation

## 2023-02-02 LAB — MYOCARDIAL PERFUSION IMAGING
Base ST Depression (mm): 0 mm
LV dias vol: 98 mL (ref 62–150)
LV sys vol: 42 mL
Nuc Stress EF: 57 %
Peak HR: 73 {beats}/min
Rest HR: 69 {beats}/min
Rest Nuclear Isotope Dose: 10.8 mCi
SDS: 1
SRS: 0
SSS: 1
ST Depression (mm): 0 mm
Stress Nuclear Isotope Dose: 31.9 mCi
TID: 1.04

## 2023-02-02 MED ORDER — TECHNETIUM TC 99M TETROFOSMIN IV KIT
10.8000 | PACK | Freq: Once | INTRAVENOUS | Status: AC | PRN
  Administered 2023-02-02: 10.8 via INTRAVENOUS

## 2023-02-02 MED ORDER — TECHNETIUM TC 99M TETROFOSMIN IV KIT
31.9000 | PACK | Freq: Once | INTRAVENOUS | Status: AC | PRN
  Administered 2023-02-02: 31.9 via INTRAVENOUS

## 2023-02-02 MED ORDER — REGADENOSON 0.4 MG/5ML IV SOLN
0.4000 mg | Freq: Once | INTRAVENOUS | Status: AC
Start: 2023-02-02 — End: 2023-02-02
  Administered 2023-02-02: 0.4 mg via INTRAVENOUS

## 2023-02-02 NOTE — Progress Notes (Signed)
   THERAPIST PROGRESS NOTE  Session Time: 2:30pm-3:20pm  Participation Level: Active  Behavioral Response: Well GroomedAlertDepressed and Irritable  Type of Therapy: Individual Therapy  Treatment Goals addressed: LTG: Reduce frequency, intensity, and duration of depression symptoms so that daily functioning is improved   ProgressTowards Goals: Progressing  Interventions: CBT  Summary: Justin [name] is a 77 y.o. male who presents with Bipolar I, mixed, moderate.   Suicidal/Homicidal: Nowithout intent/plan  Therapist Response: [name] engaged well in individual session with clinician. Clinician utilized CBT to process thoughts, feelings, and behaviors. Clinician explored impact of continuing to say yes to his neighbors and son, when he really wants to say no. Clinician discussed the tendency to agree to completing tasks and then feel resentful of those who are asking for help. [name] shared that he wants to help, but sometimes he feels like they are taking advantage of him. Clinician explored what might happen if he said no, or if he asked for help from them. [name] shared he feels his neighbors ask too much, but they are kind and reciprocate by helping with his dog or by taking him to appointments if needed. Clinician explored updates with son and noted that son continues to act as he has over the past several years: dismissive of [name] unless they need him to do something. [name] shared ongoing sadness that his relationship with grandkids has reduced to a quick hug every couple of months and that is it. Clinician explored ways to increase joy and to start doing more fun things for himself.   Plan: Return again in 2 weeks.  Diagnosis: Bipolar 1 disorder, mixed, moderate (HCC)  Collaboration of Care: Patient refused AEB none required  Patient/Guardian was advised Release of Information must be obtained prior to any record release in order to collaborate their care with an outside provider.  Patient/Guardian was advised if they have not already done so to contact the registration department to sign all necessary forms in order for [place] to release information regarding their care.   Consent: Patient/Guardian gives verbal consent for treatment and assignment of benefits for services provided during this visit. Patient/Guardian expressed understanding and agreed to proceed.   Chryl Heck [place], LCSW 02/02/2023

## 2023-02-03 ENCOUNTER — Telehealth: Payer: Self-pay | Admitting: Cardiology

## 2023-02-03 NOTE — Telephone Encounter (Signed)
Patient calling to see if his CT is still needed, since he result from his procedure eon 6/13 was normal. Please advise

## 2023-02-03 NOTE — Telephone Encounter (Signed)
Spoke with the patient and advised that he will still need his CT scan which is being done for his ablation. Patient verbalized understanding.

## 2023-02-06 ENCOUNTER — Telehealth (HOSPITAL_COMMUNITY): Payer: Self-pay | Admitting: Emergency Medicine

## 2023-02-06 NOTE — Telephone Encounter (Signed)
Reaching out to patient to offer assistance regarding upcoming cardiac imaging study; pt verbalizes understanding of appt date/time, parking situation and where to check in, pre-test NPO status and medications ordered, and verified current allergies; name and call back number provided for further questions should they arise Stiven Kaspar RN Navigator Cardiac Imaging [facility] [phone] office [phone] cell 

## 2023-02-07 ENCOUNTER — Ambulatory Visit (HOSPITAL_COMMUNITY)
Admission: RE | Admit: 2023-02-07 | Discharge: 2023-02-07 | Disposition: A | Discharge status: Home / Self Care | Payer: Medicare Other | Source: Ambulatory Visit | Attending: Cardiology | Admitting: Cardiology

## 2023-02-07 ENCOUNTER — Telehealth: Payer: Self-pay | Admitting: Cardiology

## 2023-02-07 DIAGNOSIS — I4819 Other persistent atrial fibrillation: Secondary | ICD-10-CM | POA: Diagnosis present

## 2023-02-07 MED ORDER — IOHEXOL 350 MG/ML SOLN
95.0000 mL | Freq: Once | INTRAVENOUS | Status: AC | PRN
  Administered 2023-02-07: 95 mL via INTRAVENOUS

## 2023-02-07 NOTE — Telephone Encounter (Signed)
Patient called to get results of his CT scan

## 2023-02-07 NOTE — Telephone Encounter (Signed)
Patient just had CT done today. He is aware provider need to finalize results. Once he finalize results will give him a call.

## 2023-02-09 ENCOUNTER — Encounter (HOSPITAL_COMMUNITY): Payer: Self-pay | Admitting: Licensed Clinical Social Worker

## 2023-02-09 ENCOUNTER — Ambulatory Visit (INDEPENDENT_AMBULATORY_CARE_PROVIDER_SITE_OTHER): Payer: Medicare Other | Admitting: Licensed Clinical Social Worker

## 2023-02-09 ENCOUNTER — Other Ambulatory Visit: Payer: Self-pay | Admitting: Cardiovascular Disease

## 2023-02-09 DIAGNOSIS — F3162 Bipolar disorder, current episode mixed, moderate: Secondary | ICD-10-CM | POA: Diagnosis not present

## 2023-02-09 NOTE — Progress Notes (Signed)
Virtual Visit via Video Note  I connected with Justin [name] on 02/09/23 at  2:30 PM EDT by a video enabled telemedicine application and verified that I am speaking with the correct person using two identifiers.  Location: Patient: home Provider: home office   I discussed the limitations of evaluation and management by telemedicine and the availability of in person appointments. The patient expressed understanding and agreed to proceed.   I discussed the assessment and treatment plan with the patient. The patient was provided an opportunity to ask questions and all were answered. The patient agreed with the plan and demonstrated an understanding of the instructions.   The patient was advised to call back or seek an in-person evaluation if the symptoms worsen or if the condition fails to improve as anticipated.  I provided 50 minutes of non-face-to-face time during this encounter.   Justin Melter, LCSW   THERAPIST PROGRESS NOTE  Session Time: 2:30pm-3:20pm  Participation Level: Active  Behavioral Response: NeatAlertAngry, Depressed, and Irritable  Type of Therapy: Individual Therapy  Treatment Goals addressed: LTG: Reduce frequency, intensity, and duration of depression symptoms so that daily functioning is improved   ProgressTowards Goals: Progressing  Interventions: CBT  Summary: Justin [name] is a 77 y.o. male who presents with Bipolar I, mixed moderate.   Suicidal/Homicidal: Nowithout intent/plan  Therapist Response: Justin [name] engaged well in individual virtual session with Facilities manager. Clinician utilized CBT to process thoughts, feelings, and interactions. Justin [name] shared a lot of frustration with his support system. Clinician provided time and space for Justin [name] to share his stress about upcoming heart procedure, lack of support from his son, neighbors always needing him to do something for them, and his sister being competitive. Clinician utilized CBT reality testing to remind  Justin [name] that the complaints he has today about his people are the same complaints he's had for the past 6 years or more. Clinician discussed the value of accepting his people for who they are and to remain aware that they are not changing or doing anything to change. Clinician discussed ways to either reduce the harm caused by their inability to change or to reduce his contact with them.   Plan: Return again in 2 weeks.  Diagnosis: Bipolar 1 disorder, mixed, moderate (HCC)  Collaboration of Care: Patient refused AEB none required  Patient/Guardian was advised Release of Information must be obtained prior to any record release in order to collaborate their care with an outside provider. Patient/Guardian was advised if they have not already done so to contact the registration department to sign all necessary forms in order for [place] to release information regarding their care.   Consent: Patient/Guardian gives verbal consent for treatment and assignment of benefits for services provided during this visit. Patient/Guardian expressed understanding and agreed to proceed.   Justin Heck [place], LCSW 02/09/2023

## 2023-02-13 ENCOUNTER — Telehealth: Payer: Self-pay | Admitting: Cardiology

## 2023-02-13 NOTE — Pre-Procedure Instructions (Addendum)
Instructed patient on the following items: Arrival time 1130 Nothing to eat or drink after midnight No meds AM of procedure Responsible person to drive you home and stay with you for 24 hrs- he states he doesn't have any one to stay overnight with him.  Have you missed any doses of anti-coagulant Xarelto- takes once a day, hasn't missed any doses.  Don't take in the morning.

## 2023-02-13 NOTE — Telephone Encounter (Signed)
Hospital has been informed that patient will need to spend the night. Patient is aware.

## 2023-02-13 NOTE — Telephone Encounter (Signed)
Pt is calling regarding if he is staying overnight at the hospital. Spoke with the nurse [facility] and she said she will be calling the pt back. Please advise.

## 2023-02-14 ENCOUNTER — Encounter (HOSPITAL_COMMUNITY): Payer: Self-pay | Admitting: Cardiology

## 2023-02-14 ENCOUNTER — Ambulatory Visit (HOSPITAL_COMMUNITY)
Admission: RE | Admit: 2023-02-14 | Discharge: 2023-02-15 | Disposition: A | Discharge status: Home / Self Care | Payer: Medicare Other | Attending: Cardiology | Admitting: Cardiology

## 2023-02-14 ENCOUNTER — Ambulatory Visit (HOSPITAL_BASED_OUTPATIENT_CLINIC_OR_DEPARTMENT_OTHER): Payer: Medicare Other | Admitting: Certified Registered"

## 2023-02-14 ENCOUNTER — Encounter (HOSPITAL_COMMUNITY)
Admission: RE | Disposition: A | Discharge status: Home / Self Care | Payer: Self-pay | Source: Home / Self Care | Attending: Cardiology

## 2023-02-14 ENCOUNTER — Other Ambulatory Visit: Payer: Self-pay

## 2023-02-14 ENCOUNTER — Ambulatory Visit (HOSPITAL_COMMUNITY): Payer: Medicare Other | Admitting: Certified Registered"

## 2023-02-14 DIAGNOSIS — I4819 Other persistent atrial fibrillation: Secondary | ICD-10-CM

## 2023-02-14 DIAGNOSIS — I4891 Unspecified atrial fibrillation: Secondary | ICD-10-CM | POA: Diagnosis not present

## 2023-02-14 DIAGNOSIS — I10 Essential (primary) hypertension: Secondary | ICD-10-CM | POA: Insufficient documentation

## 2023-02-14 DIAGNOSIS — R079 Chest pain, unspecified: Secondary | ICD-10-CM | POA: Diagnosis not present

## 2023-02-14 DIAGNOSIS — Z79899 Other long term (current) drug therapy: Secondary | ICD-10-CM | POA: Insufficient documentation

## 2023-02-14 DIAGNOSIS — I2510 Atherosclerotic heart disease of native coronary artery without angina pectoris: Secondary | ICD-10-CM | POA: Diagnosis not present

## 2023-02-14 DIAGNOSIS — Z7901 Long term (current) use of anticoagulants: Secondary | ICD-10-CM | POA: Insufficient documentation

## 2023-02-14 DIAGNOSIS — I319 Disease of pericardium, unspecified: Secondary | ICD-10-CM | POA: Insufficient documentation

## 2023-02-14 DIAGNOSIS — F418 Other specified anxiety disorders: Secondary | ICD-10-CM

## 2023-02-14 HISTORY — PX: ATRIAL FIBRILLATION ABLATION: EP1191

## 2023-02-14 SURGERY — ATRIAL FIBRILLATION ABLATION
Anesthesia: General

## 2023-02-14 MED ORDER — HEPARIN SODIUM (PORCINE) 1000 UNIT/ML IJ SOLN
INTRAMUSCULAR | Status: DC | PRN
  Administered 2023-02-14: 12000 [IU] via INTRAVENOUS
  Administered 2023-02-14: 5000 [IU] via INTRAVENOUS
  Administered 2023-02-14: 4000 [IU] via INTRAVENOUS

## 2023-02-14 MED ORDER — HEPARIN (PORCINE) IN NACL 1000-0.9 UT/500ML-% IV SOLN
INTRAVENOUS | Status: DC | PRN
  Administered 2023-02-14 (×3): 500 mL

## 2023-02-14 MED ORDER — RIVAROXABAN 20 MG PO TABS
20.0000 mg | ORAL_TABLET | Freq: Every day | ORAL | Status: DC
  Administered 2023-02-14 – 2023-02-15 (×2): 20 mg via ORAL
  Filled 2023-02-14 (×2): qty 1

## 2023-02-14 MED ORDER — ALPRAZOLAM 0.5 MG PO TABS
0.5000 mg | ORAL_TABLET | Freq: Every evening | ORAL | Status: DC | PRN
  Administered 2023-02-14: 0.5 mg via ORAL
  Filled 2023-02-14: qty 1

## 2023-02-14 MED ORDER — SODIUM CHLORIDE 0.9 % IV SOLN: INTRAVENOUS | Status: DC

## 2023-02-14 MED ORDER — ROSUVASTATIN CALCIUM 20 MG PO TABS
20.0000 mg | ORAL_TABLET | Freq: Every day | ORAL | Status: DC
  Administered 2023-02-15: 20 mg via ORAL
  Filled 2023-02-14: qty 1

## 2023-02-14 MED ORDER — SODIUM CHLORIDE 0.9 % IV SOLN: 250.0000 mL | INTRAVENOUS | Status: DC | PRN

## 2023-02-14 MED ORDER — SODIUM CHLORIDE 0.9% FLUSH
3.0000 mL | Freq: Two times a day (BID) | INTRAVENOUS | Status: DC
  Administered 2023-02-14 – 2023-02-15 (×2): 3 mL via INTRAVENOUS

## 2023-02-14 MED ORDER — SODIUM CHLORIDE 0.9% FLUSH: 3.0000 mL | INTRAVENOUS | Status: DC | PRN

## 2023-02-14 MED ORDER — PANTOPRAZOLE SODIUM 40 MG PO TBEC
40.0000 mg | DELAYED_RELEASE_TABLET | Freq: Every day | ORAL | Status: DC
  Administered 2023-02-14 – 2023-02-15 (×2): 40 mg via ORAL
  Filled 2023-02-14 (×2): qty 1

## 2023-02-14 MED ORDER — LIDOCAINE 2% (20 MG/ML) 5 ML SYRINGE
INTRAMUSCULAR | Status: DC | PRN
  Administered 2023-02-14: 60 mg via INTRAVENOUS

## 2023-02-14 MED ORDER — PHENYLEPHRINE HCL-NACL 20-0.9 MG/250ML-% IV SOLN
INTRAVENOUS | Status: DC | PRN
  Administered 2023-02-14: 20 ug/min via INTRAVENOUS

## 2023-02-14 MED ORDER — DILTIAZEM HCL ER COATED BEADS 180 MG PO CP24
180.0000 mg | ORAL_CAPSULE | Freq: Every day | ORAL | Status: DC
  Administered 2023-02-15: 180 mg via ORAL
  Filled 2023-02-14: qty 1

## 2023-02-14 MED ORDER — LAMOTRIGINE 100 MG PO TABS
200.0000 mg | ORAL_TABLET | Freq: Every day | ORAL | Status: DC
  Administered 2023-02-15: 200 mg via ORAL
  Filled 2023-02-14: qty 2

## 2023-02-14 MED ORDER — HEPARIN SODIUM (PORCINE) 1000 UNIT/ML IJ SOLN
INTRAMUSCULAR | Status: AC
  Filled 2023-02-14: qty 10

## 2023-02-14 MED ORDER — ISOSORBIDE MONONITRATE ER 30 MG PO TB24
30.0000 mg | ORAL_TABLET | Freq: Every day | ORAL | Status: DC
  Administered 2023-02-15: 30 mg via ORAL
  Filled 2023-02-14: qty 1

## 2023-02-14 MED ORDER — PROTAMINE SULFATE 10 MG/ML IV SOLN
INTRAVENOUS | Status: DC | PRN
  Administered 2023-02-14: 35 mg via INTRAVENOUS

## 2023-02-14 MED ORDER — ONDANSETRON HCL 4 MG/2ML IJ SOLN
INTRAMUSCULAR | Status: DC | PRN
  Administered 2023-02-14: 4 mg via INTRAVENOUS

## 2023-02-14 MED ORDER — ONDANSETRON HCL 4 MG/2ML IJ SOLN: 4.0000 mg | Freq: Four times a day (QID) | INTRAMUSCULAR | Status: DC | PRN

## 2023-02-14 MED ORDER — DEXAMETHASONE SODIUM PHOSPHATE 10 MG/ML IJ SOLN
INTRAMUSCULAR | Status: DC | PRN
  Administered 2023-02-14: 5 mg via INTRAVENOUS

## 2023-02-14 MED ORDER — ACETAMINOPHEN 325 MG PO TABS
650.0000 mg | ORAL_TABLET | ORAL | Status: DC | PRN
  Administered 2023-02-14: 650 mg via ORAL
  Filled 2023-02-14: qty 2

## 2023-02-14 MED ORDER — HEPARIN SODIUM (PORCINE) 1000 UNIT/ML IJ SOLN
INTRAMUSCULAR | Status: DC | PRN
  Administered 2023-02-14: 1000 [IU] via INTRAVENOUS

## 2023-02-14 MED ORDER — ROCURONIUM BROMIDE 10 MG/ML (PF) SYRINGE
PREFILLED_SYRINGE | INTRAVENOUS | Status: DC | PRN
  Administered 2023-02-14: 50 mg via INTRAVENOUS

## 2023-02-14 MED ORDER — PROPOFOL 10 MG/ML IV BOLUS
INTRAVENOUS | Status: DC | PRN
  Administered 2023-02-14: 150 mg via INTRAVENOUS

## 2023-02-14 MED ORDER — COLCHICINE 0.6 MG PO TABS
0.6000 mg | ORAL_TABLET | Freq: Two times a day (BID) | ORAL | Status: DC
  Administered 2023-02-14 – 2023-02-15 (×2): 0.6 mg via ORAL
  Filled 2023-02-14 (×2): qty 1

## 2023-02-14 MED ORDER — DEXMEDETOMIDINE HCL IN NACL 80 MCG/20ML IV SOLN
INTRAVENOUS | Status: DC | PRN
  Administered 2023-02-14: 4 ug via INTRAVENOUS
  Administered 2023-02-14: 8 ug via INTRAVENOUS

## 2023-02-14 MED ORDER — FENTANYL CITRATE (PF) 100 MCG/2ML IJ SOLN
INTRAMUSCULAR | Status: DC | PRN
  Administered 2023-02-14: 75 ug via INTRAVENOUS
  Administered 2023-02-14: 25 ug via INTRAVENOUS

## 2023-02-14 MED ORDER — SUGAMMADEX SODIUM 200 MG/2ML IV SOLN
INTRAVENOUS | Status: DC | PRN
  Administered 2023-02-14: 150 mg via INTRAVENOUS

## 2023-02-14 SURGICAL SUPPLY — 18 items
BAG SNAP BAND KOVER 36X36 (MISCELLANEOUS) IMPLANT
CATH ABLAT QDOT MICRO BI TC DF (CATHETERS) IMPLANT
CATH OCTARAY 2.0 F 3-3-3-3-3 (CATHETERS) IMPLANT
CATH S-M CIRCA TEMP PROBE (CATHETERS) IMPLANT
CATH SOUNDSTAR ECO 8FR (CATHETERS) IMPLANT
CATH WEBSTER BI DIR CS D-F CRV (CATHETERS) IMPLANT
CLOSURE PERCLOSE PROSTYLE (VASCULAR PRODUCTS) IMPLANT
COVER SWIFTLINK CONNECTOR (BAG) ×1 IMPLANT
PACK EP LATEX FREE (CUSTOM PROCEDURE TRAY) ×1
PACK EP LF (CUSTOM PROCEDURE TRAY) ×1 IMPLANT
PAD DEFIB RADIO PHYSIO CONN (PAD) ×1 IMPLANT
PATCH CARTO3 (PAD) IMPLANT
SHEATH BAYLIS TRANSSEPTAL 98CM (NEEDLE) IMPLANT
SHEATH CARTO VIZIGO SM CVD (SHEATH) IMPLANT
SHEATH PINNACLE 8F 10CM (SHEATH) IMPLANT
SHEATH PINNACLE 9F 10CM (SHEATH) IMPLANT
SHEATH PROBE COVER 6X72 (BAG) IMPLANT
TUBING SMART ABLATE COOLFLOW (TUBING) IMPLANT

## 2023-02-14 NOTE — Anesthesia Procedure Notes (Signed)
Procedure Name: Intubation Date/Time: 02/14/2023 1:10 PM  Performed by: De Nurse, CRNAPre-anesthesia Checklist: Patient identified, Emergency Drugs available, Suction available and Patient being monitored Patient Re-evaluated:Patient Re-evaluated prior to induction Oxygen Delivery Method: Circle System Utilized Preoxygenation: Pre-oxygenation with 100% oxygen Induction Type: IV induction Ventilation: Mask ventilation without difficulty and Two handed mask ventilation required Laryngoscope Size: Mac and 4 Grade View: Grade II Tube type: Oral Tube size: 7.5 mm Number of attempts: 1 Airway Equipment and Method: Stylet and Oral airway Placement Confirmation: ETT inserted through vocal cords under direct vision, positive ETCO2 and breath sounds checked- equal and bilateral Secured at: 23 cm Tube secured with: Tape Dental Injury: Teeth and Oropharynx as per pre-operative assessment  Comments: Perfromed by Elisabeth Most SRNA

## 2023-02-14 NOTE — Anesthesia Preprocedure Evaluation (Signed)
Anesthesia Evaluation  Patient identified by MRN, date of birth, ID band Patient awake    Reviewed: Allergy & Precautions, H&P , NPO status , Patient's Chart, lab work & pertinent test results  Airway Mallampati: II   Neck ROM: full    Dental   Pulmonary neg pulmonary ROS   breath sounds clear to auscultation       Cardiovascular hypertension, + CAD  + dysrhythmias Atrial Fibrillation + Valvular Problems/Murmurs AI  Rhythm:irregular Rate:Normal     Neuro/Psych  Headaches PSYCHIATRIC DISORDERS Anxiety Depression Bipolar Disorder  Dementia    GI/Hepatic   Endo/Other    Renal/GU      Musculoskeletal  (+) Arthritis ,    Abdominal   Peds  Hematology   Anesthesia Other Findings   Reproductive/Obstetrics                             Anesthesia Physical Anesthesia Plan  ASA: 3  Anesthesia Plan: General   Post-op Pain Management:    Induction: Intravenous  PONV Risk Score and Plan: 2 and Ondansetron, Dexamethasone, Midazolam and Treatment may vary due to age or medical condition  Airway Management Planned: Oral ETT  Additional Equipment:   Intra-op Plan:   Post-operative Plan: Extubation in OR  Informed Consent: I have reviewed the patients History and Physical, chart, labs and discussed the procedure including the risks, benefits and alternatives for the proposed anesthesia with the patient or authorized representative who has indicated his/her understanding and acceptance.     Dental advisory given  Plan Discussed with: CRNA, Anesthesiologist and Surgeon  Anesthesia Plan Comments:        Anesthesia Quick Evaluation

## 2023-02-14 NOTE — H&P (Signed)
Electrophysiology Office Follow up Visit Note:     Date:  02/14/2023    ID:  Justin [name], DOB Oct 29, 1945, MRN [id number]   PCP:  Corwin Levins, MD      [facility] Cardiologist:  Tonny Bollman, MD  [facility] Electrophysiologist:  Lanier Prude, MD      Interval History:     Justin [name] is a 77 y.o. male who presents for a follow up visit.    Last seen November 01, 2022.  He has a history of A-fib on Xarelto with a prior ablation in September 2023.  The veins and posterior wall were isolated during that procedure.  After our last appointment he wore a ZIO monitor which showed a correlation between symptoms and recurrent atrial fibrillation episodes.  He is currently scheduled for redo catheter ablation in June 2024.   Today, he reports that he has been in and out of rhythm intermittently. Last week he had an arrhythmic episode lasting for a couple days. At the time of onset he had been walking his dog when he because acutely short-winded. That same afternoon he felt a sensation of nervousness in his chest along with feeling his heart skipping. At times he has tried taking 1/4 tablet of Xanax which has helped.   He denies any chest pain, or peripheral edema. No lightheadedness, headaches, syncope, orthopnea, or PND.   Presents for redo PVI today. Procedure reviewed.   Objective  Past medical, surgical, social and family history were reviewed.   ROS:   Please see the history of present illness.    (+) Shortness of breath (+) Palpitations All other systems reviewed and are negative.   EKGs/Labs/Other Studies Reviewed:     The following studies were reviewed today:     Physical Exam:     VS:  BP 153/83   Pulse 103   Ht 5\' 11"  (1.803 m)   Wt 170 lb (77.1 kg)   SpO2 98%   BMI 23.71 kg/m         Wt Readings from Last 3 Encounters:  01/03/23 170 lb (77.1 kg)  12/22/22 171 lb (77.6 kg)  12/22/22 170 lb (77.1 kg)      GEN:  Well nourished, well developed in  no acute distress CARDIAC: RRR, no murmurs, rubs, gallops RESPIRATORY:  Clear to auscultation without rales, wheezing or rhonchi          Assessment ASSESSMENT:     1. Persistent atrial fibrillation (HCC)   2. Primary hypertension     PLAN:     In order of problems listed above:     #Persistent atrial fibrillation Recurrent after September 2023 ablation.  I discussed treatment options with the patient including continued conservative management, addition of an antiarrhythmic drug or redo catheter ablation.  He would like to proceed with catheter ablation.  This is currently scheduled for June 2024.   Discussed treatment options today for AF including antiarrhythmic drug therapy and ablation. Discussed risks, recovery and likelihood of success with each treatment strategy. Risk, benefits, and alternatives to EP study and radiofrequency ablation for afib were discussed. These risks include but are not limited to stroke, bleeding, vascular damage, tamponade, perforation, damage to the esophagus, lungs, and other structures, pulmonary vein stenosis, worsening renal function, and death.  Discussed potential need for repeat ablation procedures and antiarrhythmic drugs after an initial ablation. The patient understands these risk and wishes to proceed.  We will therefore proceed with catheter ablation  at the next available time.  Carto, ICE, anesthesia are requested for the procedure.  Will also obtain CT PV protocol prior to the procedure to exclude LAA thrombus and further evaluate atrial anatomy.     Presents for PVI today.    Signed, Steffanie Dunn, MD, [facility], [facility] 02/14/2023   Electrophysiology [facility]

## 2023-02-14 NOTE — Transfer of Care (Signed)
Immediate Anesthesia Transfer of Care Note  Patient: Justin [name]  Procedure(s) Performed: ATRIAL FIBRILLATION ABLATION  Patient Location: Cath Lab  Anesthesia Type:General  Level of Consciousness: drowsy  Airway & Oxygen Therapy: Patient Spontanous Breathing and Patient connected to nasal cannula oxygen  Post-op Assessment: Report given to RN  Post vital signs: Reviewed and stable  Last Vitals:  Vitals Value Taken Time  BP 120/77   Temp    Pulse 65   Resp 17   SpO2 97     Last Pain:  Vitals:   02/14/23 1150  TempSrc:   PainSc: 0-No pain         Complications: No notable events documented.

## 2023-02-15 ENCOUNTER — Other Ambulatory Visit (HOSPITAL_COMMUNITY): Payer: Self-pay

## 2023-02-15 ENCOUNTER — Encounter (HOSPITAL_COMMUNITY): Payer: Self-pay | Admitting: Cardiology

## 2023-02-15 DIAGNOSIS — I10 Essential (primary) hypertension: Secondary | ICD-10-CM | POA: Diagnosis not present

## 2023-02-15 DIAGNOSIS — I4819 Other persistent atrial fibrillation: Secondary | ICD-10-CM | POA: Diagnosis not present

## 2023-02-15 DIAGNOSIS — Z7901 Long term (current) use of anticoagulants: Secondary | ICD-10-CM | POA: Diagnosis not present

## 2023-02-15 DIAGNOSIS — I319 Disease of pericardium, unspecified: Secondary | ICD-10-CM | POA: Diagnosis not present

## 2023-02-15 LAB — POCT ACTIVATED CLOTTING TIME
Activated Clotting Time: 257 seconds
Activated Clotting Time: 269 seconds

## 2023-02-15 MED ORDER — KETOROLAC TROMETHAMINE 30 MG/ML IJ SOLN
30.0000 mg | Freq: Once | INTRAMUSCULAR | Status: AC
  Administered 2023-02-15: 30 mg via INTRAVENOUS
  Filled 2023-02-15: qty 1

## 2023-02-15 MED ORDER — COLCHICINE 0.6 MG PO TABS
0.6000 mg | ORAL_TABLET | Freq: Two times a day (BID) | ORAL | 0 refills | Status: DC
  Filled 2023-02-15: qty 10, 5d supply, fill #0

## 2023-02-15 MED ORDER — PANTOPRAZOLE SODIUM 40 MG PO TBEC
40.0000 mg | DELAYED_RELEASE_TABLET | Freq: Every day | ORAL | 0 refills | Status: DC
  Filled 2023-02-15 (×2): qty 45, 45d supply, fill #0

## 2023-02-15 MED ORDER — IBUPROFEN 800 MG PO TABS
800.0000 mg | ORAL_TABLET | Freq: Two times a day (BID) | ORAL | 0 refills | Status: DC
  Filled 2023-02-15: qty 6, 3d supply, fill #0

## 2023-02-15 NOTE — Plan of Care (Signed)
  Problem: Education: Goal: Understanding of disease, treatment, and recovery process will improve Outcome: Adequate for Discharge   Problem: Activity: Goal: Ability to return to baseline activity level will improve Outcome: Adequate for Discharge   Problem: Cardiac: Goal: Ability to maintain adequate cardiovascular perfusion will improve Outcome: Adequate for Discharge Goal: Vascular access site(s) Level 0-1 will be maintained Outcome: Adequate for Discharge   Problem: Health Behavior/ Discharge Planning: Goal: Ability to safely manage health related needs after discharge Outcome: Adequate for Discharge   Problem: Education: Goal: Knowledge of General Education information will improve Description: Including pain rating scale, medication(s)/side effects and non-pharmacologic comfort measures Outcome: Adequate for Discharge   Problem: Health Behavior/Discharge Planning: Goal: Ability to manage health-related needs will improve Outcome: Adequate for Discharge   Problem: Clinical Measurements: Goal: Ability to maintain clinical measurements within normal limits will improve Outcome: Adequate for Discharge Goal: Will remain free from infection Outcome: Adequate for Discharge Goal: Diagnostic test results will improve Outcome: Adequate for Discharge Goal: Respiratory complications will improve Outcome: Adequate for Discharge Goal: Cardiovascular complication will be avoided Outcome: Adequate for Discharge   Problem: Activity: Goal: Risk for activity intolerance will decrease Outcome: Adequate for Discharge   Problem: Nutrition: Goal: Adequate nutrition will be maintained Outcome: Adequate for Discharge   Problem: Coping: Goal: Level of anxiety will decrease Outcome: Adequate for Discharge   Problem: Elimination: Goal: Will not experience complications related to bowel motility Outcome: Adequate for Discharge Goal: Will not experience complications related to urinary  retention Outcome: Adequate for Discharge   Problem: Pain Managment: Goal: General experience of comfort will improve Outcome: Adequate for Discharge   Problem: Safety: Goal: Ability to remain free from injury will improve Outcome: Adequate for Discharge   Problem: Skin Integrity: Goal: Risk for impaired skin integrity will decrease Outcome: Adequate for Discharge   

## 2023-02-15 NOTE — TOC Transition Note (Signed)
Transition of Care [facility]) - CM/SW Discharge Note   Patient Details  Name: Justin [name] MRN: [id number] Date of Birth: 01/03/1946  Transition of Care [facility] CM/SW Contact:  Kermit Balo, RN Phone Number: 02/15/2023, 10:32 AM   Clinical Narrative:    Pt is discharging home with self care. No needs per TOC.   Final next level of care: Home/Self Care Barriers to Discharge: No Barriers Identified   Patient Goals and CMS Choice      Discharge Placement                         Discharge Plan and Services Additional resources added to the After Visit Summary for                                       Social Determinants of Health (SDOH) Interventions SDOH Screenings   Food Insecurity: No Food Insecurity (12/22/2022)  Housing: Low Risk  (12/22/2022)  Transportation Needs: No Transportation Needs (12/22/2022)  Utilities: Not At Risk (12/22/2022)  Alcohol Screen: Low Risk  (12/22/2022)  Depression (PHQ2-9): Medium Risk (01/12/2023)  Financial Resource Strain: Low Risk  (12/22/2022)  Physical Activity: Inactive (12/22/2022)  Social Connections: Socially Isolated (12/22/2022)  Stress: Stress Concern Present (12/22/2022)  Tobacco Use: Low Risk  (02/15/2023)     Readmission Risk Interventions     No data to display

## 2023-02-15 NOTE — Anesthesia Postprocedure Evaluation (Signed)
Anesthesia Post Note  Patient: Ansley L Quesnell  Procedure(s) Performed: ATRIAL FIBRILLATION ABLATION     Patient location during evaluation: Cath Lab Anesthesia Type: General Level of consciousness: awake and alert Pain management: pain level controlled Vital Signs Assessment: post-procedure vital signs reviewed and stable Respiratory status: spontaneous breathing, nonlabored ventilation, respiratory function stable and patient connected to nasal cannula oxygen Cardiovascular status: blood pressure returned to baseline and stable Postop Assessment: no apparent nausea or vomiting Anesthetic complications: no   No notable events documented.  Last Vitals:  Vitals:   02/15/23 0449 02/15/23 0808  BP: 120/71 127/80  Pulse: 64 66  Resp: 17 16  Temp: 36.6 C 36.9 C  SpO2: 97% 100%    Last Pain:  Vitals:   02/15/23 0808  TempSrc: Oral  PainSc:                  Avalina Benko S

## 2023-02-15 NOTE — Progress Notes (Signed)
Explained discharge instructions to patient. Reviewed follow up appointment and next medication administration times. Also reviewed education. Patient verbalized having an understanding for instructions given. All belongings are in the patient's possession to include TOC meds. IV and telemetry were removed. CCMD was notified. No other needs verbalized. Transported downstairs to the discharge lounge to await his ride. f

## 2023-02-15 NOTE — Discharge Summary (Signed)
ELECTROPHYSIOLOGY PROCEDURE DISCHARGE SUMMARY    Patient ID: Justin [name],  MRN: [id number], DOB/AGE: [birth year] 77 y.o.  Admit date: 02/14/2023 Discharge date: 02/15/2023  Primary Care Physician: Corwin Levins, MD Electrophysiologist: Dr. Lalla Brothers  Primary Discharge Diagnosis:  persistent Atrial fibrillation      CHA2DS2Vasc is 3, on Xarelto  Secondary Discharge Diagnosis:  HTN  Procedures This Admission:  1.  Electrophysiology study and radiofrequency catheter ablation on 02/14/23 by Dr Lalla Brothers   This study demonstrated  CONCLUSIONS: 1. Successful redo PVI 2. Successful reisolation of the posterior wall 3. Successful ablation of bilateral pulmonary vein carina 4. Intracardiac echo reveals trivial pericardial effusion, dilated LA 5. No early apparent complications. 6. Protonix 40mg  PO daily x 45 days 7. Colchicine 0.6mg  PO BID x 5 days  Brief HPI: Justin [name] is a 77 y.o. male with a history of persistent atrial fibrillation with prior PVI ablation.  Planned for re-do EPS/ablation with recurrent Afib.   Risks, benefits, and alternatives to catheter ablation of atrial fibrillation were reviewed with the patient who wished to proceed.    Hospital Course:  The patient was admitted and underwent EPS/RFCA of atrial fibrillation with details as outlined in his procedure report.  He was monitored on telemetry overnight which demonstrated SR.  Groin sites are without complication on the day of discharge.  The patient feels well today, this morning had some CP that seemed to resolve with belching/improved, EKG was done without acute changes, he denies ongoing CP, no SOB, site discomfort.  He was examined and re-examined by Dr. Lalla Brothers and considered to be stable for discharge.  Wound care and restrictions were reviewed with the patient.  The patient will be seen back by the Afib clinic in 4 weeks and Dr Lalla Brothers in 12 weeks for post ablation follow up.   plan Colchicine 0.6mg  PO  BID x 5 days And his home daily protonix as usual > he reports he doesn't have any or refills, will provide 45 day supply Ibuprofen 80mg  BID x3 days  Physical Exam: Vitals:   02/14/23 2331 02/15/23 0449 02/15/23 0808 02/15/23 1003  BP: 124/71 120/71 127/80 134/78  Pulse: 67 64 66 68  Resp: 20 17 16 16   Temp: 98 F (36.7 C) 97.9 F (36.6 C) 98.5 F (36.9 C)   TempSrc: Oral Oral Oral   SpO2: 97% 97% 100% 100%  Weight:      Height:        GEN- The patient is well appearing, alert and oriented x 3 today.   HEENT: normocephalic, atraumatic; sclera clear, conjunctiva pink; hearing intact; oropharynx clear; neck supple  Lungs-  CTA b/l, normal work of breathing.  No wheezes, rales, rhonchi Heart-  RRR, no murmurs, rubs or gallops  GI- soft, non-tender, non-distended, bowel sounds present  Extremities- no clubbing, cyanosis, or edema; DP/PT/radial pulses 2+ bilaterally, b/l groin sites are without hematoma/bruit MS- no significant deformity or atrophy Skin- warm and dry, no rash or lesion Psych- euthymic mood, full affect Neuro- strength and sensation are intact   Labs:   Lab Results  Component Value Date   WBC 7.6 01/30/2023   HGB 12.4 (L) 01/30/2023   HCT 37.9 01/30/2023   MCV 97 01/30/2023   PLT 237 01/30/2023   No results for input(s): "NA", "K", "CL", "CO2", "BUN", "CREATININE", "CALCIUM", "PROT", "BILITOT", "ALKPHOS", "ALT", "AST", "GLUCOSE" in the last 168 hours.  Invalid input(s): "LABALBU"   Discharge Medications:  Allergies as of 02/15/2023  Reactions   Ambien [zolpidem Tartrate] Other (See Comments)   Blackout, memory issues   Codeine    Causes Memory issues   Amiodarone    dizziness   Ativan [lorazepam] Other (See Comments)   Made him feel completely out of it fell down   Gabapentin Other (See Comments)   Drowsy during the day   Amitriptyline Palpitations   Seroquel [quetiapine]    SEVERE NIGHTMARES, SLEEPWALK AND NIGHT DRIVE WITH NO RECOLLECTION  UPON WAKENING        Medication List     TAKE these medications    ALPRAZolam 1 MG tablet Commonly known as: XANAX TAKE 1/2 TO 1 TABLET BY MOUTH ONCE DAILY What changed: See the new instructions.   colchicine 0.6 MG tablet Take 1 tablet (0.6 mg total) by mouth 2 (two) times daily for 5 days.   diltiazem 180 MG 24 hr capsule Commonly known as: CARDIZEM CD TAKE 1 CAPSULE BY MOUTH DAILY   gabapentin 300 MG capsule Commonly known as: NEURONTIN Take one in AM and 2 in PM.   ibuprofen 800 MG tablet Commonly known as: ADVIL Take 1 tablet (800 mg total) by mouth in the morning and at bedtime.   isosorbide mononitrate 30 MG 24 hr tablet Commonly known as: IMDUR TAKE 1 TABLET BY MOUTH DAILY   lamoTRIgine 200 MG tablet Commonly known as: LAMICTAL Take 1 tablet (200 mg total) by mouth daily.   nitroGLYCERIN 0.4 MG SL tablet Commonly known as: NITROSTAT Place 1 tablet (0.4 mg total) under the tongue every 5 (five) minutes as needed for chest pain.   pantoprazole 40 MG tablet Commonly known as: PROTONIX Take 1 tablet (40 mg total) by mouth daily.   rivaroxaban 20 MG Tabs tablet Commonly known as: XARELTO Take 1 tablet (20 mg total) by mouth daily.   rosuvastatin 20 MG tablet Commonly known as: CRESTOR TAKE 1 TABLET BY MOUTH DAILY   sucralfate 1 g tablet Commonly known as: Carafate Take 1 tablet (1 g total) by mouth 4 (four) times daily.   vitamin B-12 500 MCG tablet Commonly known as: CYANOCOBALAMIN Take 1,000 mcg by mouth daily.        Disposition: Home Discharge Instructions     Diet - low sodium heart healthy   Complete by: As directed    Increase activity slowly   Complete by: As directed         Duration of Discharge Encounter: Greater than 30 minutes including physician time.  Norma Fredrickson, PA-C 02/15/2023 10:23 AM

## 2023-02-15 NOTE — Discharge Instructions (Signed)

## 2023-02-15 NOTE — Progress Notes (Signed)
Went in to provide post procedure discharge instructions, the patient appeared comfortable talking on the phone. After he hung up and I was discussing /medicines and post procedure restrictions started to complain of "terrible CP" and clutching at his chest HR/rhythm remained stable 70's SR RRR on exam, no rubs, JVD, BP stable Discussed colchicine's role in post procedure CP, he continued to wince in pain, "it's really bad" Will hold discharge Get an EKG,  just got his, colchicine about a 1/2 hour ago. Will watch him a little longer this AM  As I was leaving the room, he thought perhaps gas.  MT came out and advised me that the pt declined EKG, felt it was gas, and was improved asked him to go ahead and do the EKG  Francis Dowse, PA-C

## 2023-02-21 ENCOUNTER — Ambulatory Visit (HOSPITAL_COMMUNITY): Payer: Medicare Other | Admitting: Licensed Clinical Social Worker

## 2023-02-21 ENCOUNTER — Other Ambulatory Visit: Payer: Self-pay | Admitting: Internal Medicine

## 2023-02-22 ENCOUNTER — Other Ambulatory Visit: Payer: Self-pay | Admitting: Cardiovascular Disease

## 2023-03-07 ENCOUNTER — Ambulatory Visit (HOSPITAL_COMMUNITY): Payer: Medicare Other | Admitting: Licensed Clinical Social Worker

## 2023-03-07 DIAGNOSIS — F3162 Bipolar disorder, current episode mixed, moderate: Secondary | ICD-10-CM

## 2023-03-08 ENCOUNTER — Encounter (HOSPITAL_COMMUNITY): Payer: Self-pay | Admitting: Licensed Clinical Social Worker

## 2023-03-08 NOTE — Progress Notes (Signed)
   THERAPIST PROGRESS NOTE  Session Time: 11:00am-11:55am  Participation Level: Active  Behavioral Response: Well GroomedAlertDepressed  Type of Therapy: Individual Therapy  Treatment Goals addressed:  LTG: Reduce frequency, intensity, and duration of depression symptoms so that daily functioning is improved   ProgressTowards Goals: Progressing  Interventions: CBT  Summary: Justin [name] is a 77 y.o. male who presents with Bipolar I, mixed, moderate.   Suicidal/Homicidal:  increasing thoughts  with plan but no means at this time.   Therapist Response: Justin [name] engaged well in session with clinician. Clinician utilized CBT to process thoughts, feelings, and interactions. Clinician identified increasing stress with other people in his support system which makes him feel hopeless, overloaded, and stressed. Clinician processed increase in suicidal thoughts and noted increased action towards purchasing a gun. Clinician safety planned with Justin [name] and encouraged ongoing weekly sessions and possible referral to IOP or PHP if SI continues. Clinician discussed ways to set boundaries with neighbors and son. Clinician also discussed relationship with son and some regrets from the past. Clinician explored the impact of sharing this with son, as son is now going through a separation.   Plan: Return again in 1 weeks. Will see Dr. Lolly Mustache later this week.   Diagnosis: Bipolar 1 disorder, mixed, moderate (HCC)  Collaboration of Care: Psychiatrist AEB notified Dr, Lolly Mustache and encouraged Justin [name] to follow safety plan.   Patient/Guardian was advised Release of Information must be obtained prior to any record release in order to collaborate their care with an outside provider. Patient/Guardian was advised if they have not already done so to contact the registration department to sign all necessary forms in order for [place] to release information regarding their care.   Consent: Patient/Guardian gives verbal consent for  treatment and assignment of benefits for services provided during this visit. Patient/Guardian expressed understanding and agreed to proceed.   Chryl Heck [place], LCSW 03/08/2023

## 2023-03-09 ENCOUNTER — Encounter (HOSPITAL_COMMUNITY): Payer: Self-pay

## 2023-03-09 ENCOUNTER — Telehealth (HOSPITAL_COMMUNITY): Payer: Medicare Other | Admitting: Psychiatry

## 2023-03-09 ENCOUNTER — Other Ambulatory Visit (HOSPITAL_COMMUNITY): Payer: Self-pay | Admitting: Psychiatry

## 2023-03-09 DIAGNOSIS — F4310 Post-traumatic stress disorder, unspecified: Secondary | ICD-10-CM

## 2023-03-09 DIAGNOSIS — F3162 Bipolar disorder, current episode mixed, moderate: Secondary | ICD-10-CM

## 2023-03-09 NOTE — Progress Notes (Signed)
No show

## 2023-03-14 ENCOUNTER — Ambulatory Visit (HOSPITAL_COMMUNITY)
Admission: RE | Admit: 2023-03-14 | Discharge: 2023-03-14 | Disposition: A | Discharge status: Home / Self Care | Payer: Medicare Other | Source: Ambulatory Visit | Attending: Physician Assistant | Admitting: Physician Assistant

## 2023-03-14 ENCOUNTER — Encounter (HOSPITAL_COMMUNITY): Payer: Self-pay | Admitting: Physician Assistant

## 2023-03-14 VITALS — BP 140/62 | HR 82 | Ht 70.5 in | Wt 164.0 lb

## 2023-03-14 DIAGNOSIS — R9431 Abnormal electrocardiogram [ECG] [EKG]: Secondary | ICD-10-CM | POA: Insufficient documentation

## 2023-03-14 DIAGNOSIS — I10 Essential (primary) hypertension: Secondary | ICD-10-CM | POA: Insufficient documentation

## 2023-03-14 DIAGNOSIS — D6869 Other thrombophilia: Secondary | ICD-10-CM

## 2023-03-14 DIAGNOSIS — I4819 Other persistent atrial fibrillation: Secondary | ICD-10-CM

## 2023-03-14 DIAGNOSIS — I2510 Atherosclerotic heart disease of native coronary artery without angina pectoris: Secondary | ICD-10-CM | POA: Diagnosis not present

## 2023-03-14 NOTE — Progress Notes (Signed)
Primary Care Physician: Corwin Levins, MD Primary Cardiologist: Dr Izora Ribas Primary Electrophysiologist: Dr Lalla Brothers Referring Physician: Dr Laurance Flatten is a 77 y.o. male with a history of CAD, HTN, HLD, atrial fibrillation who presents for follow up in the [facility].  The patient was initially diagnosed with atrial fibrillation 05/06/21 at Dr Debby Bud office. He wore a cardiac monitor which showed 100% rate controlled afib. He does feel palpitations and "swimmy headed" while in afib. He also has more SOB and fatigue. Patient is on Eliquis for a CHADS2VASC score of 4. Patient underwent ankle surgery on 06/08/21. He resumed anticoagulation 06/10/21.   Patient is s/p DCCV on 08/05/21 with quick return of afib. He saw Dr Lalla Brothers and had an afib ablation 05/17/22. He was found to have persistent afib following ablation (Zio showed 100% afib burden) and he underwent DCCV on 07/28/22. He was back in afib at his visit with Dr Lalla Brothers and started on amiodarone with another DCCV on 09/23/22. However, amiodarone was discontinued due to dizziness.   On follow up today, patient is s/p repeat afib ablation on 02/14/23. Patient reports that he did have some episodes of afib the first week post ablation but this has resolved. He does not have any ongoing chest pain, swallowing pain, or groin issues.   Today, he denies symptoms of palpitations, chest pain, orthopnea, PND, lower extremity edema, dizziness, presyncope, syncope, snoring, daytime somnolence, bleeding, or neurologic sequela. The patient is tolerating medications without difficulties and is otherwise without complaint today.    Atrial Fibrillation Risk Factors:  he does not have symptoms or diagnosis of sleep apnea. he does not have a history of rheumatic fever. he does not have a history of alcohol use.   Atrial Fibrillation Management history:  Previous antiarrhythmic drugs: amiodarone   Previous cardioversions: 08/05/21, 07/28/22, 09/23/22 Previous ablations: 05/17/22, 09/23/22 Anticoagulation history: Eliquis   Past Medical History:  Diagnosis Date   Anxiety and depression    Aortic insufficiency 03/11/2022   Echocardiogram 02/2022: EF 70-75, no RWMA, moderate LVH, normal RVSF, normal PASP (RVSP 20), moderate LAE, mild MR, moderate AI, AV sclerosis without stenosis, no aortic root or ascending aorta dilation   Arthritis    knees, c-spine // gets lumbar ESI q 6 mos   Bipolar disorder (HCC)    CAD (coronary artery disease)    [place] 08/2009 >> LHC ([facility]) - mLAD 70, Dx 65 >> PCI:  3x15 mm Endeavor DES to mLAD and 2.5x12 mm Endeavor DES to Dx // S/p NSTEMI >> [facility] 8/12 ([facility]) - LM ok, LAD prox and mid 40; LAD stent ok, Dx stent ok, mRCA 30, mLCx 50 >> med Rx // ETT-Echo 2/17 ([facility]): Normal, EF 55-60 at rest   Chicken pox    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HCC)    Grief    History of echocardiogram    Echo 3/19: Mild concentric LVH, EF 60-65, normal wall motion, grade 1 diastolic dysfunction, mild AI, mildly dilated aortic root (40 mm), MAC, mild LAE, atrial septal lipomatous hypertrophy // Echo 8/22: EF 55-60, no RWMA, mild LVH, GRII DD, normal RVSF, RVSP 40, mild LAE, trivial MR, mild-moderate AI, AV sclerosis without stenosis, mild dilation of aortic root (39 mm)   History of non-ST elevation myocardial infarction (NSTEMI)    History of nuclear stress test    Nuclear stress test 3/19: EF 57, inferior/inferoseptal/inferolateral defect consistent with probable soft tissue attenuation (cannot exclude subendocardial scar), no  ischemia, intermediate risk >> Echo 3/19 normal EF, normal wall motion   History of pneumonia 2011   History of prostatitis 1990s   Hx of adenomatous colonic polyps 06/22/2016   Hyperlipidemia    Hypertension    Migraines    prior history   PTSD (post-traumatic stress disorder)     ROS- All systems are reviewed and negative except as per the HPI  above.  Physical Exam: Vitals:   03/14/23 1122  BP: (!) 140/62  Pulse: 82  Weight: 74.4 kg  Height: 5' 10.5" (1.791 m)   GEN: Well nourished, well developed in no acute distress NECK: No JVD; No carotid bruits CARDIAC: Regular rate and rhythm, no murmurs, rubs, gallops RESPIRATORY:  Clear to auscultation without rales, wheezing or rhonchi  ABDOMEN: Soft, non-tender, non-distended EXTREMITIES:  No edema; No deformity   Wt Readings from Last 3 Encounters:  03/14/23 74.4 kg  02/14/23 77.1 kg  02/02/23 76.2 kg    EKG today demonstrates  SR, PVC Vent. rate 82 BPM PR interval 178 ms QRS duration 94 ms QT/QTcB 380/443 ms  Echo 11/17/22 demonstrated   1. Left ventricular ejection fraction, by estimation, is 55 to 60%. The  left ventricle has normal function. The left ventricle has no regional  wall motion abnormalities. Left ventricular diastolic parameters are  consistent with Grade I diastolic dysfunction (impaired relaxation).   2. Right ventricular systolic function is normal. The right ventricular  size is normal.   3. No evidence of mitral valve regurgitation.   4. The aortic valve is grossly normal. Aortic valve regurgitation is  moderate.   5. Aneurysm of the aortic root, measuring 38 mm.   Conclusion(s)/Recommendation(s): MR has improved compared to prior study.   Epic records are reviewed at length today  CHA2DS2-VASc Score = 4  The patient's score is based upon: CHF History: 0 HTN History: 1 Diabetes History: 0 Stroke History: 0 Vascular Disease History: 1 Age Score: 2 Gender Score: 0       ASSESSMENT AND PLAN: Persistent Atrial Fibrillation (ICD10:  I48.19) The patient's CHA2DS2-VASc score is 4, indicating a 4.8% annual risk of stroke.   S/p afib ablation 05/17/22 and 02/14/23 Patient appears to be maintaining SR Continue diltiazem 180 mg daily Continue Eliquis 5 mg BID with no missed doses for 3 months post ablation.   Secondary Hypercoagulable  State (ICD10:  D68.69) The patient is at significant risk for stroke/thromboembolism based upon his CHA2DS2-VASc Score of 4.  Continue Apixaban (Eliquis).   HTN Stable on current regimen  CAD No anginal symptoms   Follow up with Dr Lalla Brothers as scheduled.    Jorja Loa PA-C [facility] [address] [place], [place] [address] [phone] 03/14/2023 11:39 AM

## 2023-03-16 ENCOUNTER — Encounter (HOSPITAL_COMMUNITY): Payer: Self-pay | Admitting: Licensed Clinical Social Worker

## 2023-03-16 ENCOUNTER — Ambulatory Visit (INDEPENDENT_AMBULATORY_CARE_PROVIDER_SITE_OTHER): Payer: Medicare Other | Admitting: Licensed Clinical Social Worker

## 2023-03-16 DIAGNOSIS — F3162 Bipolar disorder, current episode mixed, moderate: Secondary | ICD-10-CM | POA: Diagnosis not present

## 2023-03-16 NOTE — Progress Notes (Signed)
Virtual Visit via Video Note  I connected with Justin [name] on 03/16/23 at  1:30 PM EDT by a video enabled telemedicine application and verified that I am speaking with the correct person using two identifiers.  Location: Patient: home Provider: home office   I discussed the limitations of evaluation and management by telemedicine and the availability of in person appointments. The patient expressed understanding and agreed to proceed.   I discussed the assessment and treatment plan with the patient. The patient was provided an opportunity to ask questions and all were answered. The patient agreed with the plan and demonstrated an understanding of the instructions.   The patient was advised to call back or seek an in-person evaluation if the symptoms worsen or if the condition fails to improve as anticipated.  I provided 45 minutes of non-face-to-face time during this encounter.   Justin Melter, LCSW   THERAPIST PROGRESS NOTE  Session Time: 1:30pm-2:15pm  Participation Level: Active  Behavioral Response: Well GroomedAlertIrritable  Type of Therapy: Individual Therapy  Treatment Goals addressed:   LTG: Reduce frequency, intensity, and duration of depression symptoms so that daily functioning is improved   ProgressTowards Goals: Progressing  Interventions: CBT  Summary: Justin [name] is a 77 y.o. male who presents with Bipolar I, mixed.   Suicidal/Homicidal: Nowithout intent/plan  Therapist Response: Justin [name] engaged well in virtual session with Facilities manager. Clinician processed thoughts, feelings, and interactions using CBT. Clinician provided time and space for Justin [name] to share frustrations with son, neighbors, and sister. Clinician identified that he has now become the sounding board for his family and friends, as he is a good listener and a helper. Clinician offered supportive guidance on how to not internalize their problems or get frustrated with them sharing their problems  with him. Clinician also identified the importance of not trying to give advice or problem solve, as they will blame him if he gives bad advice. Clinician explored Justin [name]'s mental state and noted no SI and no plan or thoughts of purchasing a weapon or doing anything to harm himself.   Plan: Return again in 1 weeks.  Diagnosis: Bipolar 1 disorder, mixed, moderate (HCC)  Collaboration of Care: Patient refused AEB none required. He will see Dr. Lolly [name] tomorrow  Patient/Guardian was advised Release of Information must be obtained prior to any record release in order to collaborate their care with an outside provider. Patient/Guardian was advised if they have not already done so to contact the registration department to sign all necessary forms in order for [place] to release information regarding their care.   Consent: Patient/Guardian gives verbal consent for treatment and assignment of benefits for services provided during this visit. Patient/Guardian expressed understanding and agreed to proceed.   Justin Heck [place], LCSW 03/16/2023

## 2023-03-17 ENCOUNTER — Telehealth (HOSPITAL_BASED_OUTPATIENT_CLINIC_OR_DEPARTMENT_OTHER): Payer: Medicare Other | Admitting: Psychiatry

## 2023-03-17 ENCOUNTER — Encounter (HOSPITAL_COMMUNITY): Payer: Self-pay | Admitting: Psychiatry

## 2023-03-17 VITALS — Wt 164.0 lb

## 2023-03-17 DIAGNOSIS — F4310 Post-traumatic stress disorder, unspecified: Secondary | ICD-10-CM | POA: Diagnosis not present

## 2023-03-17 DIAGNOSIS — F3162 Bipolar disorder, current episode mixed, moderate: Secondary | ICD-10-CM | POA: Diagnosis not present

## 2023-03-17 MED ORDER — LAMOTRIGINE 200 MG PO TABS
200.0000 mg | ORAL_TABLET | Freq: Every day | ORAL | 2 refills | Status: DC
Start: 2023-03-17 — End: 2023-07-21

## 2023-03-17 NOTE — Progress Notes (Signed)
[facility] MD Virtual Progress Note   Patient Location: Home Provider Location: Home Office  I connect with patient by telephone and verified that I am speaking with correct person by using two identifiers. I discussed the limitations of evaluation and management by telemedicine and the availability of in person appointments. I also discussed with the patient that there may be a patient responsible charge related to this service. The patient expressed understanding and agreed to proceed.  Justin [name] [id number] 77 y.o.  03/17/2023 10:41 AM  History of Present Illness:  Patient is evaluated by phone session.  He reported he cannot do video session but next time he will come in the office.  Overall he feels things are little bit better as family situation is somewhat under control.  He just had a visit with the therapist yesterday.  He admitted chronic stress from the family but now he is handling better than he anticipated.  He is taking Lamictal which is helping his mood, irritability, nightmares and flashbacks.  He is sleeping better.  He saw the neurologist and given the diagnosis of neuropathy which he knew about it but not able to tolerate the gabapentin.  He recently had a visit to a cardiologist and is happy that his blood pressure is stable.  Denies any mania, psychosis, hallucination.  He has no tremors or shakes.  His appetite is okay.  His weight is unchanged from the past.  He does take Xanax as needed prescribed by PCP.  He has no rash or any itching from the Lamictal.  He like to keep his current medication.  Past Psychiatric History: H/O suicidal attempt by overdose on Ambien twice, hydroxyzine and alcohol. H/O bipolar disorder with multiple inpatient.  Did IOP. Last ER visit Sept 2020 and inpatient October 2018 at [facility]. Tried Zyprexa, Cymbalta, Seroquel, Vistaril, Neurontin, Wellbutrin and triptal. Tried low-dose doxepin and trazodone. Had a reaction with Ativan and  pain medication.  Saw Dr. Emerson Monte in past.    Outpatient Encounter Medications as of 03/17/2023  Medication Sig   ALPRAZolam (XANAX) 1 MG tablet TAKE 1/2 TO 1 TABLET BY MOUTH ONCE DAILY (Patient taking differently: as needed. TAKE 1/2 TO 1 TABLET BY MOUTH ONCE DAILY)   colchicine 0.6 MG tablet Take 1 tablet (0.6 mg total) by mouth 2 (two) times daily for 5 days.   diltiazem (CARDIZEM CD) 180 MG 24 hr capsule TAKE 1 CAPSULE BY MOUTH DAILY   gabapentin (NEURONTIN) 300 MG capsule Take one in AM and 2 in PM. (Patient not taking: Reported on 01/31/2023)   ibuprofen (ADVIL) 800 MG tablet Take 1 tablet (800 mg total) by mouth in the morning and at bedtime. (Patient not taking: Reported on 03/14/2023)   isosorbide mononitrate (IMDUR) 30 MG 24 hr tablet TAKE 1 TABLET BY MOUTH DAILY   lamoTRIgine (LAMICTAL) 200 MG tablet Take 1 tablet (200 mg total) by mouth daily.   nitroGLYCERIN (NITROSTAT) 0.4 MG SL tablet Place 1 tablet (0.4 mg total) under the tongue every 5 (five) minutes as needed for chest pain.   pantoprazole (PROTONIX) 40 MG tablet Take 1 tablet (40 mg total) by mouth daily.   rivaroxaban (XARELTO) 20 MG TABS tablet Take 1 tablet (20 mg total) by mouth daily.   rosuvastatin (CRESTOR) 20 MG tablet TAKE 1 TABLET BY MOUTH DAILY   sucralfate (CARAFATE) 1 g tablet Take 1 tablet (1 g total) by mouth 4 (four) times daily. (Patient not taking: Reported on 02/08/2023)  vitamin B-12 (CYANOCOBALAMIN) 500 MCG tablet Take 1,000 mcg by mouth daily.   No facility-administered encounter medications on file as of 03/17/2023.    Recent Results (from the past 2160 hour(s))  VITAMIN D 25 Hydroxy (Vit-D Deficiency, Fractures)     Status: None   Collection Time: 01/12/23 11:39 AM  Result Value Ref Range   VITD 30.47 30.00 - 100.00 ng/mL  Vitamin B12     Status: Abnormal   Collection Time: 01/12/23 11:39 AM  Result Value Ref Range   Vitamin B-12 168 (L) 211 - 911 pg/mL  Basic metabolic panel     Status:  Abnormal   Collection Time: 01/12/23 11:39 AM  Result Value Ref Range   Sodium 134 (L) 135 - 145 mEq/L   Potassium 4.8 3.5 - 5.1 mEq/L   Chloride 98 96 - 112 mEq/L   CO2 28 19 - 32 mEq/L   Glucose, Bld 82 70 - 99 mg/dL   BUN 14 6 - 23 mg/dL   Creatinine, Ser 0.86 0.40 - 1.50 mg/dL   GFR 57.84 >69.62 mL/min    Comment: Calculated using the CKD-EPI Creatinine Equation (2021)   Calcium 9.7 8.4 - 10.5 mg/dL  PSA     Status: None   Collection Time: 01/12/23 11:39 AM  Result Value Ref Range   PSA 3.38 0.10 - 4.00 ng/mL    Comment: Test performed using Access Hybritech PSA Assay, a parmagnetic partical, chemiluminecent immunoassay.  Urinalysis, Routine w reflex microscopic     Status: Abnormal   Collection Time: 01/12/23 11:39 AM  Result Value Ref Range   Color, Urine YELLOW Yellow;Lt. Yellow;Straw;Dark Yellow;Amber;Green;Red;Brown   APPearance CLEAR Clear;Turbid;Slightly Cloudy;Cloudy   Specific Gravity, Urine 1.010 1.000 - 1.030   pH 6.5 5.0 - 8.0   Total Protein, Urine NEGATIVE Negative   Urine Glucose NEGATIVE Negative   Ketones, ur NEGATIVE Negative   Bilirubin Urine NEGATIVE Negative   Hgb urine dipstick TRACE-LYSED (A) Negative   Urobilinogen, UA 0.2 0.0 - 1.0   Leukocytes,Ua TRACE (A) Negative   Nitrite NEGATIVE Negative   WBC, UA 3-6/hpf (A) 0-2/hpf   RBC / HPF 0-2/hpf 0-2/hpf  TSH     Status: Abnormal   Collection Time: 01/12/23 11:39 AM  Result Value Ref Range   TSH 0.34 (L) 0.35 - 5.50 uIU/mL  CBC with Differential/Platelet     Status: Abnormal   Collection Time: 01/12/23 11:39 AM  Result Value Ref Range   WBC 7.0 4.0 - 10.5 K/uL   RBC 4.36 4.22 - 5.81 Mil/uL   Hemoglobin 14.3 13.0 - 17.0 g/dL   HCT 95.2 84.1 - 32.4 %   MCV 97.7 78.0 - 100.0 fl   MCHC 33.5 30.0 - 36.0 g/dL   RDW 40.1 02.7 - 25.3 %   Platelets 222.0 150.0 - 400.0 K/uL   Neutrophils Relative % 79.6 (H) 43.0 - 77.0 %   Lymphocytes Relative 12.1 12.0 - 46.0 %   Monocytes Relative 6.8 3.0 - 12.0 %    Eosinophils Relative 0.5 0.0 - 5.0 %   Basophils Relative 1.0 0.0 - 3.0 %   Neutro Abs 5.6 1.4 - 7.7 K/uL   Lymphs Abs 0.9 0.7 - 4.0 K/uL   Monocytes Absolute 0.5 0.1 - 1.0 K/uL   Eosinophils Absolute 0.0 0.0 - 0.7 K/uL   Basophils Absolute 0.1 0.0 - 0.1 K/uL  Hepatic function panel     Status: Abnormal   Collection Time: 01/12/23 11:39 AM  Result Value Ref Range  Total Bilirubin 1.3 (H) 0.2 - 1.2 mg/dL   Bilirubin, Direct 0.3 0.0 - 0.3 mg/dL   Alkaline Phosphatase 68 39 - 117 U/L   AST 39 (H) 0 - 37 U/L   ALT 29 0 - 53 U/L   Total Protein 6.7 6.0 - 8.3 g/dL   Albumin 4.3 3.5 - 5.2 g/dL  Lipid panel     Status: None   Collection Time: 01/12/23 11:39 AM  Result Value Ref Range   Cholesterol 133 0 - 200 mg/dL    Comment: ATP III Classification       Desirable:  < 200 mg/dL               Borderline High:  200 - 239 mg/dL          High:  > = 213 mg/dL   Triglycerides 08.6 0.0 - 149.0 mg/dL    Comment: Normal:  <578 mg/dLBorderline High:  150 - 199 mg/dL   HDL 46.96 >29.52 mg/dL   VLDL 84.1 0.0 - 32.4 mg/dL   LDL Cholesterol 49 0 - 99 mg/dL   Total CHOL/HDL Ratio 2     Comment:                Men          Women1/2 Average Risk     3.4          3.3Average Risk          5.0          4.42X Average Risk          9.6          7.13X Average Risk          15.0          11.0                       NonHDL 59.36     Comment: NOTE:  Non-HDL goal should be 30 mg/dL higher than patient's LDL goal (i.e. LDL goal of < 70 mg/dL, would have non-HDL goal of < 100 mg/dL)  Hemoglobin M0N     Status: None   Collection Time: 01/12/23 11:39 AM  Result Value Ref Range   Hgb A1c MFr Bld 4.8 4.6 - 6.5 %    Comment: Glycemic Control Guidelines for People with Diabetes:Non Diabetic:  <6%Goal of Therapy: <7%Additional Action Suggested:  >8%   Microalbumin / creatinine urine ratio     Status: None   Collection Time: 01/12/23 11:39 AM  Result Value Ref Range   Microalb, Ur <0.7 0.0 - 1.9 mg/dL   Creatinine,U  02.7 mg/dL   Microalb Creat Ratio 1.7 0.0 - 30.0 mg/g  H. pylori antibody, IgG     Status: None   Collection Time: 01/12/23 11:39 AM  Result Value Ref Range   H Pylori IgG Negative Negative  Basic metabolic panel     Status: None   Collection Time: 01/30/23 11:10 AM  Result Value Ref Range   Glucose 89 70 - 99 mg/dL   BUN 9 8 - 27 mg/dL   Creatinine, Ser 2.53 0.76 - 1.27 mg/dL   eGFR 92 >66 YQ/IHK/7.42   BUN/Creatinine Ratio 11 10 - 24   Sodium 137 134 - 144 mmol/L   Potassium 4.3 3.5 - 5.2 mmol/L   Chloride 103 96 - 106 mmol/L   CO2 24 20 - 29 mmol/L   Calcium 9.3 8.6 - 10.2 mg/dL  CBC  with Differential/Platelet     Status: Abnormal   Collection Time: 01/30/23 11:10 AM  Result Value Ref Range   WBC 7.6 3.4 - 10.8 x10E3/uL   RBC 3.91 (L) 4.14 - 5.80 x10E6/uL   Hemoglobin 12.4 (L) 13.0 - 17.7 g/dL   Hematocrit 13.0 86.5 - 51.0 %   MCV 97 79 - 97 fL   MCH 31.7 26.6 - 33.0 pg   MCHC 32.7 31.5 - 35.7 g/dL   RDW 78.4 69.6 - 29.5 %   Platelets 237 150 - 450 x10E3/uL   Neutrophils 83 Not Estab. %   Lymphs 11 Not Estab. %   Monocytes 5 Not Estab. %   Eos 1 Not Estab. %   Basos 0 Not Estab. %   Neutrophils Absolute 6.3 1.4 - 7.0 x10E3/uL   Lymphocytes Absolute 0.9 0.7 - 3.1 x10E3/uL   Monocytes Absolute 0.4 0.1 - 0.9 x10E3/uL   EOS (ABSOLUTE) 0.1 0.0 - 0.4 x10E3/uL   Basophils Absolute 0.0 0.0 - 0.2 x10E3/uL   Immature Granulocytes 0 Not Estab. %   Immature Grans (Abs) 0.0 0.0 - 0.1 x10E3/uL  MYOCARDIAL PERFUSION IMAGING     Status: None   Collection Time: 02/02/23  2:11 PM  Result Value Ref Range   Rest HR 69.0 bpm   Rest BP 132/79 mmHg   Peak HR 73 bpm   Peak BP 129/81 mmHg   Rest Nuclear Isotope Dose 10.8 mCi   Stress Nuclear Isotope Dose 31.9 mCi   SSS 1.0    SRS 0.0    SDS 1.0    TID 1.04    LV sys vol 42.0 mL   LV dias vol 98.0 62 - 150 mL   Nuc Stress EF 57 %   Base ST Depression (mm) 0 mm   ST Depression (mm) 0 mm  POCT Activated clotting time     Status:  None   Collection Time: 02/14/23  2:09 PM  Result Value Ref Range   Activated Clotting Time 257 seconds    Comment: Reference range 74-137 seconds for patients not on anticoagulant therapy.  POCT Activated clotting time     Status: None   Collection Time: 02/14/23  2:39 PM  Result Value Ref Range   Activated Clotting Time 269 seconds    Comment: Reference range 74-137 seconds for patients not on anticoagulant therapy.     Psychiatric Specialty Exam: Physical Exam  Review of Systems  Weight 164 lb (74.4 kg).There is no height or weight on file to calculate BMI.  General Appearance: NA  Eye Contact:  NA  Speech:  Normal Rate  Volume:  Normal  Mood:  Anxious and Dysphoric  Affect:  NA  Thought Process:  Descriptions of Associations: Intact  Orientation:  Full (Time, Place, and Person)  Thought Content:  Rumination  Suicidal Thoughts:  No  Homicidal Thoughts:  No  Memory:  Immediate;   Good Recent;   Good Remote;   Fair  Judgement:  Fair  Insight:  Present  Psychomotor Activity:  NA  Concentration:  Concentration: Fair and Attention Span: Fair  Recall:  Good  Fund of Knowledge:  Good  Language:  Good  Akathisia:  No  Handed:  Right  AIMS (if indicated):     Assets:  Communication Skills Desire for Improvement Housing Transportation  ADL's:  Intact  Cognition:  WNL  Sleep:  better     Assessment/Plan: Bipolar 1 disorder, mixed, moderate (HCC) - Plan: lamoTRIgine (LAMICTAL) 200 MG tablet  PTSD (post-traumatic stress disorder) - Plan: lamoTRIgine (LAMICTAL) 200 MG tablet  Patient is a stable on Lamictal 200 mg daily.  Discussed chronic family stress but handling better than he anticipated.  He is not sure if his son moved with him since his manage life is not going very well.  Patient told he cannot say no to his son if he decided to move him but does not want to be involved in the decision.  Discussed medication side effects and benefits.  Patient has appointment  coming up in September with the neurology to discuss other options to address his neuropathy.  Recommended to call us back if he has any question or any concern.  Encouraged to continue therapy with Shanda Bumps.  Follow-up in 3 months.  I reminded that he need to have a in person visit if he cannot do video session.   Follow Up Instructions:     I discussed the assessment and treatment plan with the patient. The patient was provided an opportunity to ask questions and all were answered. The patient agreed with the plan and demonstrated an understanding of the instructions.   The patient was advised to call back or seek an in-person evaluation if the symptoms worsen or if the condition fails to improve as anticipated.    Collaboration of Care: Other provider involved in patient's care AEB notes are available in epic to review.  Patient/Guardian was advised Release of Information must be obtained prior to any record release in order to collaborate their care with an outside provider. Patient/Guardian was advised if they have not already done so to contact the registration department to sign all necessary forms in order for [place] to release information regarding their care.   Consent: Patient/Guardian gives verbal consent for treatment and assignment of benefits for services provided during this visit. Patient/Guardian expressed understanding and agreed to proceed.     I provided 21 minutes of non face to face time during this encounter.  Note: This document was prepared by [employer] voice dictation technology and any errors that results from this process are unintentional.    Cleotis Nipper, MD 03/17/2023

## 2023-03-21 ENCOUNTER — Encounter (HOSPITAL_COMMUNITY): Payer: Self-pay | Admitting: Licensed Clinical Social Worker

## 2023-03-21 ENCOUNTER — Ambulatory Visit (INDEPENDENT_AMBULATORY_CARE_PROVIDER_SITE_OTHER): Payer: Medicare Other | Admitting: Licensed Clinical Social Worker

## 2023-03-21 DIAGNOSIS — F3162 Bipolar disorder, current episode mixed, moderate: Secondary | ICD-10-CM | POA: Diagnosis not present

## 2023-03-21 NOTE — Progress Notes (Signed)
Virtual Visit via Video Note  I connected with Justin [name] on 03/21/23 at 10:00 AM EDT by a video enabled telemedicine application and verified that I am speaking with the correct person using two identifiers.  Location: Patient: home Provider: office   I discussed the limitations of evaluation and management by telemedicine and the availability of in person appointments. The patient expressed understanding and agreed to proceed.   I discussed the assessment and treatment plan with the patient. The patient was provided an opportunity to ask questions and all were answered. The patient agreed with the plan and demonstrated an understanding of the instructions.   The patient was advised to call back or seek an in-person evaluation if the symptoms worsen or if the condition fails to improve as anticipated.  I provided 45 minutes of non-face-to-face time during this encounter.   [name], LCSW   THERAPIST PROGRESS NOTE  Session Time: 10:00am-10:45am  Participation Level: Active  Behavioral Response: NeatAlertIrritable  Type of Therapy: Individual Therapy  Treatment Goals addressed:  LTG: Reduce frequency, intensity, and duration of depression symptoms so that daily functioning is improved     ProgressTowards Goals: Progressing  Interventions: CBT  Summary: Justin [name] is a 77 y.o. male who presents with Bipolar I disorder, mixed, moderate.   Suicidal/Homicidal: Nowithout intent/plan  Therapist Response: Justin [name] engaged well in individual virtual session with Facilities manager. Clinician utilized CBT to discuss updates in thoughts, feelings, and interactions. Justin [name] shared that he continues to feel stressed out and used by his family and friends who are now using him for labor and psychological support. Clinician discussed ways to set boundaries. Clinician taught and demonstrated the sandwich approach to boundary setting. Clinician encouraged Justin [name] to use this with neighbors as  soon as possible in order to reduce the likelihood that he will become resentful of them. 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 He shared this has increased his son's interest in including him in his life.  Justin [name] shared he has been having trouble sleeping and has been waking up several times throughout the night. He also shared that he was having intense and realistic dreams.   Plan: Return again in 1 weeks.  Diagnosis: Bipolar 1 disorder, mixed, moderate (HCC)  Collaboration of Care: Psychiatrist AEB reviewed Dr. Sheela Stack note from 7/26  Patient/Guardian was advised Release of Information must be obtained prior to any record release in order to collaborate their care with an outside provider. Patient/Guardian was advised if they have not already done so to contact the registration department to sign all necessary forms in order for [place] to release information regarding their care.   Consent: Patient/Guardian gives verbal consent for treatment and assignment of benefits for services provided during this visit. Patient/Guardian expressed understanding and agreed to proceed.   Chryl Heck [place], LCSW 03/21/2023

## 2023-03-30 ENCOUNTER — Ambulatory Visit (INDEPENDENT_AMBULATORY_CARE_PROVIDER_SITE_OTHER): Payer: Medicare Other | Admitting: Licensed Clinical Social Worker

## 2023-03-30 DIAGNOSIS — F3162 Bipolar disorder, current episode mixed, moderate: Secondary | ICD-10-CM | POA: Diagnosis not present

## 2023-04-04 ENCOUNTER — Ambulatory Visit (INDEPENDENT_AMBULATORY_CARE_PROVIDER_SITE_OTHER): Payer: Medicare Other | Admitting: Licensed Clinical Social Worker

## 2023-04-04 DIAGNOSIS — F3162 Bipolar disorder, current episode mixed, moderate: Secondary | ICD-10-CM | POA: Diagnosis not present

## 2023-04-18 ENCOUNTER — Ambulatory Visit (INDEPENDENT_AMBULATORY_CARE_PROVIDER_SITE_OTHER): Payer: Medicare Other | Admitting: Licensed Clinical Social Worker

## 2023-04-18 DIAGNOSIS — F3162 Bipolar disorder, current episode mixed, moderate: Secondary | ICD-10-CM

## 2023-04-25 ENCOUNTER — Encounter (HOSPITAL_COMMUNITY): Payer: Self-pay | Admitting: Licensed Clinical Social Worker

## 2023-04-25 NOTE — Progress Notes (Signed)
Virtual Visit via Video Note  I connected with Justin [name] on 04/25/23 at  4:30 PM EDT by a video enabled telemedicine application and verified that I am speaking with the correct person using two identifiers.  Location: Patient: home Provider: office   I discussed the limitations of evaluation and management by telemedicine and the availability of in person appointments. The patient expressed understanding and agreed to proceed.   I discussed the assessment and treatment plan with the patient. The patient was provided an opportunity to ask questions and all were answered. The patient agreed with the plan and demonstrated an understanding of the instructions.   The patient was advised to call back or seek an in-person evaluation if the symptoms worsen or if the condition fails to improve as anticipated.  I provided 45 minutes of non-face-to-face time during this encounter.   [name], LCSW   THERAPIST PROGRESS NOTE  Session Time: 4:30pm-5:15pm  Participation Level: Active  Behavioral Response: Well GroomedAlertDepressed and Irritable  Type of Therapy: Individual Therapy  Treatment Goals addressed: LTG: Reduce frequency, intensity, and duration of depression symptoms so that daily functioning is improved   ProgressTowards Goals: Progressing  Interventions: CBT  Summary: Justin [name] is a 77 y.o. male who presents with Bipolar I, mixed, moderate.   Suicidal/Homicidal: Nowithout intent/plan  Therapist Response: Justin [name] engaged well in virtual session with Facilities manager. Clinician utilized CBT to process current stressors, thoughts, feelings, and interactions. Justin [name] shared updates and ongoing challenges in coping with his son and his neighbors. Clinician explored boundary setting with all of his support system. Clinician provided "Sandwich" confrontation about not doing everything for his neighbors, not answering the phone late at night or early in the morning. Clinician  explored meds and whether or not he feels they are helpful. Clinician discussed options for having an in-person appt with Dr. Lolly Mustache in order to share his concerns.   Plan: Return again in 1-2 weeks.  Diagnosis: Bipolar 1 disorder, mixed, moderate (HCC)  Collaboration of Care: Patient refused AEB none required  Patient/Guardian was advised Release of Information must be obtained prior to any record release in order to collaborate their care with an outside provider. Patient/Guardian was advised if they have not already done so to contact the registration department to sign all necessary forms in order for [place] to release information regarding their care.   Consent: Patient/Guardian gives verbal consent for treatment and assignment of benefits for services provided during this visit. Patient/Guardian expressed understanding and agreed to proceed.   Chryl Heck [place], LCSW 04/25/2023

## 2023-04-26 ENCOUNTER — Encounter (HOSPITAL_COMMUNITY): Payer: Self-pay | Admitting: Licensed Clinical Social Worker

## 2023-04-26 ENCOUNTER — Ambulatory Visit (HOSPITAL_COMMUNITY): Payer: Medicare Other | Admitting: Licensed Clinical Social Worker

## 2023-04-26 DIAGNOSIS — F3162 Bipolar disorder, current episode mixed, moderate: Secondary | ICD-10-CM

## 2023-04-26 NOTE — Progress Notes (Signed)
Virtual Visit via Video Note  I connected with Estill Bakes on 04/26/23 at  8:00 AM EDT by a video enabled telemedicine application and verified that I am speaking with the correct person using two identifiers.  Location: Patient: home  Provider: home office   I discussed the limitations of evaluation and management by telemedicine and the availability of in person appointments. The patient expressed understanding and agreed to proceed.   I discussed the assessment and treatment plan with the patient. The patient was provided an opportunity to ask questions and all were answered. The patient agreed with the plan and demonstrated an understanding of the instructions.   The patient was advised to call back or seek an in-person evaluation if the symptoms worsen or if the condition fails to improve as anticipated.  I provided 45 minutes of non-face-to-face time during this encounter.   [name], LCSW   THERAPIST PROGRESS NOTE  Session Time: 8:00am-8:45am  Participation Level: Active  Behavioral Response: NeatConfusedDepressed  Type of Therapy: Individual Therapy  Treatment Goals addressed: LTG: Reduce frequency, intensity, and duration of depression symptoms so that daily functioning is improved   ProgressTowards Goals: Progressing  Interventions: CBT  Summary: JARIAH FISCHEL is a 77 y.o. male who presents with Bipolar I, mixed, moderate.   Suicidal/Homicidal: Nowithout intent/plan  Therapist Response: [name] engaged well in virtual individual session with Facilities manager. Clinician utilized CBT to process thoughts, feelings, and behaviors. [name] shared that he set a boundary with his neighbors about not mowing their lawn or doing other tasks for them. Clinician explored outcomes of boundaries and noted that they have not called or come outside while he was out there in days. Clinician discussed thoughts and feelings about this and noted that this felt like evidence that they  were using him. Clinician reflected that this may be the case, or it may be that they are hurt and decided to take a break. [name] shared that whatever the consequences, he was happy he stood up for himself. Clinician explored other relationships, including with son and grandchildren. [name] shared ongoing ups and downs with son, especially depending on if son needs [name] to do something. [name] also shared that his relationship with son is highly influenced by son's relationship with his wife. Clinician identified the importance of setting boundaries with son as well, making sure that his support system is aware that he does not have to do everything for him.   [name] shared that he has been increasingly experiencing mixed episodes. He requested some sort of "as needed" or on call number to talk to someone if this clinician is unavailable. Clinician provided 988 hotline for support.   Plan: Return again in 2 weeks.  Diagnosis: Bipolar 1 disorder, mixed, moderate (HCC)  Collaboration of Care: Other provided 988 hotline in case of need between sessions.  Patient/Guardian was advised Release of Information must be obtained prior to any record release in order to collaborate their care with an outside provider. Patient/Guardian was advised if they have not already done so to contact the registration department to sign all necessary forms in order for [place] to release information regarding their care.   Consent: Patient/Guardian gives verbal consent for treatment and assignment of benefits for services provided during this visit. Patient/Guardian expressed understanding and agreed to proceed.   Chryl Heck [place], LCSW 04/26/2023

## 2023-04-28 ENCOUNTER — Telehealth: Payer: Self-pay | Admitting: Cardiovascular Disease

## 2023-04-28 DIAGNOSIS — I4819 Other persistent atrial fibrillation: Secondary | ICD-10-CM

## 2023-04-28 MED ORDER — RIVAROXABAN 20 MG PO TABS
20.0000 mg | ORAL_TABLET | Freq: Every day | ORAL | 5 refills | Status: DC
Start: 2023-04-28 — End: 2023-09-08

## 2023-04-28 NOTE — Telephone Encounter (Signed)
Prescription refill request for Xarelto received.  Indication: Afib  Last office visit: 03/14/23 Charlean Merl)  Weight: 74.4kg Age: 77 Scr: 0.79 (01/30/23)  CrCl: 83.44ml/min  Appropriate dose. Refill sent.

## 2023-04-28 NOTE — Telephone Encounter (Signed)
*  STAT* If patient is at the pharmacy, call can be transferred to refill team.   1. Which medications need to be refilled? (please list name of each medication and dose if known) rivaroxaban (XARELTO) 20 MG TABS tablet   2. Which pharmacy/location (including street and city if local pharmacy) is medication to be sent to?[facility] - [place], [place] - [address] [address]   3. Do they need a 30 day or 90 day supply? 30 day

## 2023-05-03 ENCOUNTER — Ambulatory Visit (INDEPENDENT_AMBULATORY_CARE_PROVIDER_SITE_OTHER): Payer: Medicare Other | Admitting: Licensed Clinical Social Worker

## 2023-05-03 ENCOUNTER — Encounter (HOSPITAL_COMMUNITY): Payer: Self-pay | Admitting: Licensed Clinical Social Worker

## 2023-05-03 DIAGNOSIS — F3162 Bipolar disorder, current episode mixed, moderate: Secondary | ICD-10-CM | POA: Diagnosis not present

## 2023-05-03 NOTE — Progress Notes (Signed)
Virtual Visit via Video Note  I connected with Estill Bakes on 05/03/23 at  2:30 PM EDT by a video enabled telemedicine application and verified that I am speaking with the correct person using two identifiers.  Location: Patient: home Provider: home office   I discussed the limitations of evaluation and management by telemedicine and the availability of in person appointments. The patient expressed understanding and agreed to proceed.   I discussed the assessment and treatment plan with the patient. The patient was provided an opportunity to ask questions and all were answered. The patient agreed with the plan and demonstrated an understanding of the instructions.   The patient was advised to call back or seek an in-person evaluation if the symptoms worsen or if the condition fails to improve as anticipated.  I provided 50 minutes of non-face-to-face time during this encounter.   [name], LCSW   THERAPIST PROGRESS NOTE  Session Time: 2:30pm-3:20pm  Participation Level: Active  Behavioral Response: NeatConfusedDepressed  Type of Therapy: Individual Therapy  Treatment Goals addressed:  LTG: Reduce frequency, intensity, and duration of depression symptoms so that daily functioning is improved   ProgressTowards Goals: Not Progressing  Interventions: CBT  Summary: JAVONI DESANTOS is a 77 y.o. male who presents with Bipolar I, mixed, moderate.   Suicidal/Homicidal: Nowithout intent/plan  Therapist Response: [name] engaged well in individual virtual session with Facilities manager. Clinician utilized CBT to process thoughts, feelings, and interactions. [name] shared ongoing mixed episodes continuing since last week. He shared frustration, depression, confusion, and worry that he will not be able to regain control over his emotions. 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 Clinician provided feedback about this, reviewed what had been discussed in the past about what he knows and what he needs or does not need to know. Clinician discussed medication management and noted that [name] continues to take medications as prescribed, but he is not getting the same effects that he once did. Clinician encouraged [name] to have a sooner appt with Dr. Lolly Mustache in person to discuss his concerns.   Plan: Return again in 1 weeks.  Diagnosis: Bipolar 1 disorder, mixed, moderate (HCC)  Collaboration of Care: Psychiatrist AEB informed Dr. Lolly Mustache about med concerns  Patient/Guardian was advised Release of Information must be obtained prior to any record release in order to collaborate their care with an outside provider. Patient/Guardian was advised if they have not already done so to contact the registration department to sign all necessary forms in order for [place] to release information regarding their care.   Consent: Patient/Guardian gives verbal consent for treatment and assignment of benefits for services provided during this visit. Patient/Guardian expressed understanding and agreed to proceed.   Chryl Heck [place], LCSW 05/03/2023

## 2023-05-05 ENCOUNTER — Encounter: Payer: Self-pay | Admitting: Cardiovascular Disease

## 2023-05-10 ENCOUNTER — Encounter (HOSPITAL_COMMUNITY): Payer: Self-pay | Admitting: Licensed Clinical Social Worker

## 2023-05-10 ENCOUNTER — Ambulatory Visit (INDEPENDENT_AMBULATORY_CARE_PROVIDER_SITE_OTHER): Payer: Medicare Other | Admitting: Licensed Clinical Social Worker

## 2023-05-10 DIAGNOSIS — F3162 Bipolar disorder, current episode mixed, moderate: Secondary | ICD-10-CM | POA: Diagnosis not present

## 2023-05-10 NOTE — Progress Notes (Signed)
Virtual Visit via Video Note  I connected with Estill Bakes on 05/10/23 at  2:30 PM EDT by a video enabled telemedicine application and verified that I am speaking with the correct person using two identifiers.  Location: Patient: home Provider: home office   I discussed the limitations of evaluation and management by telemedicine and the availability of in person appointments. The patient expressed understanding and agreed to proceed.   I discussed the assessment and treatment plan with the patient. The patient was provided an opportunity to ask questions and all were answered. The patient agreed with the plan and demonstrated an understanding of the instructions.   The patient was advised to call back or seek an in-person evaluation if the symptoms worsen or if the condition fails to improve as anticipated.  I provided 45 minutes of non-face-to-face time during this encounter.   [name], LCSW   THERAPIST PROGRESS NOTE  Session Time: 2:30pm-3:15pm  Participation Level: Active  Behavioral Response: NeatAlertEuthymic  Type of Therapy: Individual Therapy  Treatment Goals addressed: LTG: Reduce frequency, intensity, and duration of depression symptoms so that daily functioning is improved   ProgressTowards Goals: Progressing  Interventions: CBT  Summary: HIREN VITUCCI is a 77 y.o. male who presents with Bipolar I, mixed moderate.   Suicidal/Homicidal: Nowithout intent/plan  Therapist Response: [name] engaged well in virtual session with Facilities manager. Clinician utilized CBT to process thoughts, feelings, and interactions. [name] shared that he has been doing better this week, following last session. Clinician explored updates and noted that he has been working more on acceptance of himself, his family, and his circumstances. He shared that since he has been changing his own expectations of his family and friends, he has actually felt more relaxed and less frustrated.  Clinician processed changes in this attitude and noted the change in how his family has treated him following this change. Clinician identified that he has been in closer contact with son and he was actually invited in to spend time. Clinician also noted that his neighbors have come back and are interacting a little more. Clinician discussed HALT acronym: Hungry, Angry, Lonely, Tired as triggers to anger, anxiety, and stress.   Plan: Return again in 4 weeks.  Diagnosis: Bipolar 1 disorder, mixed, moderate (HCC)  Collaboration of Care: Patient refused AEB none required  Patient/Guardian was advised Release of Information must be obtained prior to any record release in order to collaborate their care with an outside provider. Patient/Guardian was advised if they have not already done so to contact the registration department to sign all necessary forms in order for [place] to release information regarding their care.   Consent: Patient/Guardian gives verbal consent for treatment and assignment of benefits for services provided during this visit. Patient/Guardian expressed understanding and agreed to proceed.   Chryl Heck [place], LCSW 05/10/2023

## 2023-05-15 NOTE — Progress Notes (Unsigned)
PATIENT: Justin [name] DOB: October 15, 1945  REASON FOR VISIT: follow up HISTORY FROM: patient  HISTORY OF PRESENT ILLNESS: Today 05/15/23:  Justin [name] is a 77 y.o. male with a history of ***. Returns today for follow-up.   10/27/22:  "Foot pain after extensive surgery "   Justin [name] is a 77 y.o. male patient who is here for a new problem , visit 10/27/2022 for neuropathy, foot pain. He had foot surgery, after an MR revealed a " turned tendon" . Steroid shots did not help the pain. He has had surgery, has felt since as if a vice is crushing his midfoot on the right side,  had some neuropathy before on the right side? He denies this. His reports is contradictory as he stated he had tingling numbness and sometimes pain from above his ankles into his feet ( plantar, lateral aspect by his description) before the surgery, but apparently his right foot pain has been much more intense.    His regular orthopedist was Dr. Peggye Form, and Dr. Susa Simmonds was his foot surgeon in 06-07-2021.  He was send to Dr Bufford Buttner for EMG and NCS in January 2024 at [facility]; who discussed the test result with the patient: "I explained that repair of the posterior tibial tendon is not a treatment option and that tendon transfer and calcaneus osteotomy are appropriate for posterior tibial tendon tear and that he is surgical outcome appears appropriate with restoration of his arch. This was the only description of the patient's pre operative condition and surgical procedure available to me - the patient was inexact in his description and could not recall the diagnosis except for a flat foot.      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  The same is stated  for the right tibial nerve axonal motor and peripheral neuropathy.  This patient should have a sensory neuropathy given how much pain he feels, He may have  small fiber neuropathy, of course.  He has absent sural nerve responses.  He has no sign of compartment syndrome and no Reflex sympathetic dystrophy sign to skin, with his toes having normal capillary refill. No edema.  The diagnosis was hereditary and idiopathic neuropathy.  But a hereditary and idiopathic neuropathy would not affect one foot only. He also does not have asymmetric muscle mass loss, so a motor neuropathy with onset in 2022 is really not evident.  He can wiggle his toes but his foot is still flat and the dorsum of his foot is the most painful area while he describes a numbness and sometimes a tingling or burning at the bottom of the feet. He described a tight vice feeling around his foot.    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   Absent right sural sensory nerve conduction study; likely secondary to advanced sural peripheral neuropathy.   No electrodiagnostic evidence of a common peroneal motor entrapment neuropathy at the level of the right fibular head.   No electrodiagnostic evidence of a lumbosacral radiculopathy in the right lower extremity.   No electrodiagnostic evidence of a myopathy in the right lower extremity. [id number]      04/20/20: Justin [name] is a 77 year old male with a history of initial memory loss. He returns today for follow-up. He did not have  neuropsychological testing. The order was placed to an adolescent center and it was benign. Overall though he feels that his memory has gotten better. He states that he is able to complete all ADLs independently. Whereas before he would not be able to remember if he showered. He is managing his own finances without difficulty. He states that initially he had trouble remembering payments. Reports that his sleep has improved with Valium. He continues to  live at home alone. But does have family and friends that check on him frequently. He returns today for an evaluation.  HISTORY (Copied from Dr.Dohmeier's note)    Justin [name] presents today as a new patient today on October 15, 2019.  He was seen by his primary care physician on 17 December after his son insisted on an evaluation for worsening memory especially short-term memory.  He has forgotten to switch of the stove burners on the computer, he has left the current thyroid on.  He has had several visits from his son that he cannot recall.  This seems to be quite significant cognitive impairment with amnesia.  He has never been a heavy drinker and had none in the last 6 months.  He has been followed by counselor for PTSD since the death of his wife.  Has history of coronary artery disease but following up but never smoked.  Has a past history of migraine.  He had a vascular stent placement coronary artery disease in August 22, 2010 9 years ago, had a total knee arthroscopy and arthroplasty on August 31, 2018.  Has had elbow surgeries in the 1990s.     Please note that the patient has responded to [place] twice  with amnestic events, performing complex behaviors that he cannot recall.  Looking at his current medication list and his follow-up psychotropic medication Cogentin, Abilify, Plavix, Lamictal but he is also on Crestor Nitrostat and cardia-diltiazem.  Blood tests last obtained in September 2020 showed an elevated glucose level but I am not sure that the level was obtained fasting.  Sodium was low at 130 potassium was low at 3.4 chloride low at 97 BUN very low at 5.  Creatinine was normal.  HbA1c 4.8 TSH 0.77.   A Mini-Mental status was asked obtained in his primary care physician's office at the time he scored 30 out of 30 today he scored 27 out of 30.  He had a brain MRI that was performed without contrast and compared to a head CT from 2011 which showed brain atrophy that was generalized and  considered not age appropriate and appropriate.         REVIEW OF SYSTEMS: Out of a complete 14 system review of symptoms, the patient complains only of the following symptoms, and all other reviewed systems are negative.  See HPI ALLERGIES: Allergies  Allergen Reactions   Ambien [Zolpidem Tartrate] Other (See Comments)    Blackout, memory issues   Codeine     Causes Memory issues    Amiodarone     dizziness   Ativan [Lorazepam] Other (See Comments)    Made him feel completely out of it fell down   Gabapentin Other (See Comments)    Drowsy during the day   Amitriptyline Palpitations   Seroquel [Quetiapine]     SEVERE NIGHTMARES, SLEEPWALK AND NIGHT DRIVE WITH NO RECOLLECTION UPON WAKENING    HOME MEDICATIONS: Outpatient Medications Prior to Visit  Medication Sig Dispense Refill   ALPRAZolam (XANAX) 1 MG tablet TAKE 1/2 TO 1  TABLET BY MOUTH ONCE DAILY (Patient taking differently: as needed. TAKE 1/2 TO 1 TABLET BY MOUTH ONCE DAILY) 30 tablet 2   colchicine 0.6 MG tablet Take 1 tablet (0.6 mg total) by mouth 2 (two) times daily for 5 days. 10 tablet 0   diltiazem (CARDIZEM CD) 180 MG 24 hr capsule TAKE 1 CAPSULE BY MOUTH DAILY 30 capsule 11   gabapentin (NEURONTIN) 300 MG capsule Take one in AM and 2 in PM. (Patient not taking: Reported on 01/31/2023) 90 capsule 11   ibuprofen (ADVIL) 800 MG tablet Take 1 tablet (800 mg total) by mouth in the morning and at bedtime. (Patient not taking: Reported on 03/14/2023) 6 tablet 0   isosorbide mononitrate (IMDUR) 30 MG 24 hr tablet TAKE 1 TABLET BY MOUTH DAILY 90 tablet 3   lamoTRIgine (LAMICTAL) 200 MG tablet Take 1 tablet (200 mg total) by mouth daily. 30 tablet 2   nitroGLYCERIN (NITROSTAT) 0.4 MG SL tablet Place 1 tablet (0.4 mg total) under the tongue every 5 (five) minutes as needed for chest pain. 25 tablet 3   pantoprazole (PROTONIX) 40 MG tablet Take 1 tablet (40 mg total) by mouth daily. 45 tablet 0   rivaroxaban (XARELTO) 20 MG  TABS tablet Take 1 tablet (20 mg total) by mouth daily. 30 tablet 5   rosuvastatin (CRESTOR) 20 MG tablet TAKE 1 TABLET BY MOUTH DAILY 90 tablet 3   sucralfate (CARAFATE) 1 g tablet Take 1 tablet (1 g total) by mouth 4 (four) times daily. (Patient not taking: Reported on 02/08/2023) 120 tablet 0   vitamin B-12 (CYANOCOBALAMIN) 500 MCG tablet Take 1,000 mcg by mouth daily.     No facility-administered medications prior to visit.    PAST MEDICAL HISTORY: Past Medical History:  Diagnosis Date   Anxiety and depression    Aortic insufficiency 03/11/2022   Echocardiogram 02/2022: EF 70-75, no RWMA, moderate LVH, normal RVSF, normal PASP (RVSP 20), moderate LAE, mild MR, moderate AI, AV sclerosis without stenosis, no aortic root or ascending aorta dilation   Arthritis    knees, c-spine // gets lumbar ESI q 6 mos   Bipolar disorder (HCC)    CAD (coronary artery disease)    [place] 08/2009 >> LHC ([facility]) - mLAD 70, Dx 65 >> PCI:  3x15 mm Endeavor DES to mLAD and 2.5x12 mm Endeavor DES to Dx // S/p NSTEMI >> LHC 8/12 ([facility]) - LM ok, LAD prox and mid 40; LAD stent ok, Dx stent ok, mRCA 30, mLCx 50 >> med Rx // ETT-Echo 2/17 ([facility]): Normal, EF 55-60 at rest   Chicken pox    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HCC)    Grief    History of echocardiogram    Echo 3/19: Mild concentric LVH, EF 60-65, normal wall motion, grade 1 diastolic dysfunction, mild AI, mildly dilated aortic root (40 mm), MAC, mild LAE, atrial septal lipomatous hypertrophy // Echo 8/22: EF 55-60, no RWMA, mild LVH, GRII DD, normal RVSF, RVSP 40, mild LAE, trivial MR, mild-moderate AI, AV sclerosis without stenosis, mild dilation of aortic root (39 mm)   History of non-ST elevation myocardial infarction (NSTEMI)    History of nuclear stress test    Nuclear stress test 3/19: EF 57, inferior/inferoseptal/inferolateral defect consistent with probable soft tissue attenuation (cannot exclude subendocardial scar), no ischemia, intermediate  risk >> Echo 3/19 normal EF, normal wall motion   History of pneumonia 2011   History of prostatitis 1990s   Hx of adenomatous colonic polyps  06/22/2016   Hyperlipidemia    Hypertension    Migraines    prior history   PTSD (post-traumatic stress disorder)     PAST SURGICAL HISTORY: Past Surgical History:  Procedure Laterality Date   ATRIAL FIBRILLATION ABLATION N/A 05/17/2022   Procedure: ATRIAL FIBRILLATION ABLATION;  Surgeon: Lanier Prude, MD;  Location: [facility];  Service: Cardiovascular;  Laterality: N/A;   ATRIAL FIBRILLATION ABLATION N/A 02/14/2023   Procedure: ATRIAL FIBRILLATION ABLATION;  Surgeon: Lanier Prude, MD;  Location: [facility]AS[facility];  Service: Cardiovascular;  Laterality: N/A;   CARDIOVERSION N/A 08/05/2021   Procedure: CARDIOVERSION;  Surgeon: Elease Hashimoto Deloris Ping, MD;  Location: [facility] ENDOSCOPY;  Service: Cardiovascular;  Laterality: N/A;   CARDIOVERSION N/A 07/28/2022   Procedure: CARDIOVERSION;  Surgeon: Chrystie Nose, MD;  Location: [facility] ENDOSCOPY;  Service: Cardiovascular;  Laterality: N/A;   CARDIOVERSION N/A 09/23/2022   Procedure: CARDIOVERSION;  Surgeon: Parke Poisson, MD;  Location: [facility] ENDOSCOPY;  Service: Cardiovascular;  Laterality: N/A;   CERVICAL DISCECTOMY     COLONOSCOPY  03/2017   ELBOW SURGERY Left    ELBOW SURGERY Right    INGUINAL HERNIA REPAIR Bilateral    1996, 1997   KNEE ARTHROSCOPY Right    KNEE CARTILAGE SURGERY Left    NASAL SEPTUM SURGERY     STENT PLACEMENT VASCULAR ([facility] HX)  09/02/2010   TONSILLECTOMY     TOTAL KNEE ARTHROPLASTY Left 08/31/2018   Procedure: TOTAL KNEE ARTHROPLASTY;  Surgeon: Jodi Geralds, MD;  Location: [facility] ORS;  Service: Orthopedics;  Laterality: Left;    FAMILY HISTORY: Family History  Problem Relation Age of Onset   Arthritis Mother    Heart disease Mother        s/p CABG   Hypertension Mother    Alzheimer's disease Mother    Heart attack Mother    Arthritis Father    Hyperlipidemia  Father    Heart disease Father        s/p CABG   Stroke Father    Hypertension Father    Heart attack Father    Multiple sclerosis Sister    Diabetes Sister    Lung cancer Paternal Grandmother    Diabetes Sister    Heart attack Sister    Pulmonary embolism Sister        died   Lung cancer Maternal Aunt    Lung cancer Paternal Aunt    Depression Brother        suicide    SOCIAL HISTORY: Social History   Socioeconomic History   Marital status: Widowed    Spouse name: Not on file   Number of children: 2   Years of education: 5   Highest education level: Not on file  Occupational History   Occupation: Retired  Tobacco Use   Smoking status: Never   Smokeless tobacco: Never   Tobacco comments:    Never smoke 06/14/22  Vaping Use   Vaping status: Never Used  Substance and Sexual Activity   Alcohol use: Not Currently    Alcohol/week: 6.0 standard drinks of alcohol    Types: 6 Cans of beer per week    Comment: 3 beers in a sitting   Drug use: No   Sexual activity: Not Currently  Other Topics Concern   Not on file  Social History Narrative   Retired, widowed in 2017    1 son / 1 daughter   2 caffeinated beverages daily no alcohol or tobacco  Fun: Work out in the yard.   Denies religious beliefs effecting healthcare.    Worked at [employer] for 30 years   Social Determinants of [employer]: Low Risk  (12/22/2022)   Overall Financial Resource Strain (CARDIA)    Difficulty of Paying Living Expenses: Not hard at all  Food Insecurity: No Food Insecurity (12/22/2022)   Hunger Vital Sign    Worried About Running Out of Food in the Last Year: Never true    Ran Out of Food in the Last Year: Never true  Transportation Needs: No Transportation Needs (12/22/2022)   PRAPARE - Administrator, Civil Service (Medical): No    Lack of Transportation (Non-Medical): No  Physical Activity: Inactive (12/22/2022)   Exercise Vital Sign    Days of Exercise  per Week: 0 days    Minutes of Exercise per Session: 0 min  Stress: Stress Concern Present (12/22/2022)   [employer] of Occupational Health - Occupational Stress Questionnaire    Feeling of Stress : To some extent  Social Connections: Socially Isolated (12/22/2022)   Social Connection and Isolation Panel [NHANES]    Frequency of Communication with Friends and Family: More than three times a week    Frequency of Social Gatherings with Friends and Family: Once a week    Attends Religious Services: Never    Database administrator or Organizations: No    Attends Banker Meetings: Never    Marital Status: Widowed  Intimate Partner Violence: Not At Risk (12/22/2022)   Humiliation, Afraid, Rape, and Kick questionnaire    Fear of Current or Ex-Partner: No    Emotionally Abused: No    Physically Abused: No    Sexually Abused: No      PHYSICAL EXAM  There were no vitals filed for this visit.  There is no height or weight on file to calculate BMI.  Generalized: Well developed, in no acute distress   Neurological examination  Mentation: Alert oriented to time, place, history taking. Follows all commands speech and language fluent Cranial nerve II-XII: Pupils were equal round reactive to light. Extraocular movements were full, visual field were full on confrontational test. Head turning and shoulder shrug  were normal and symmetric. Motor: The motor testing reveals 5 over 5 strength of all 4 extremities. Good symmetric motor tone is noted throughout.  Sensory: Sensory testing is intact to soft touch on all 4 extremities. No evidence of extinction is noted.  Coordination: Cerebellar testing reveals good finger-nose-finger and heel-to-shin bilaterally.  Gait and station: Gait is normal.  Reflexes: Deep tendon reflexes are symmetric and normal bilaterally.   DIAGNOSTIC DATA (LABS, IMAGING, TESTING) - I reviewed patient records, labs, notes, testing and imaging myself where  available.  Lab Results  Component Value Date   WBC 7.6 01/30/2023   HGB 12.4 (L) 01/30/2023   HCT 37.9 01/30/2023   MCV 97 01/30/2023   PLT 237 01/30/2023      Component Value Date/Time   NA 137 01/30/2023 1110   K 4.3 01/30/2023 1110   CL 103 01/30/2023 1110   CO2 24 01/30/2023 1110   GLUCOSE 89 01/30/2023 1110   GLUCOSE 82 01/12/2023 1139   BUN 9 01/30/2023 1110   CREATININE 0.79 01/30/2023 1110   CALCIUM 9.3 01/30/2023 1110   PROT 6.7 01/12/2023 1139   PROT 6.4 10/15/2019 1543   ALBUMIN 4.3 01/12/2023 1139   AST 39 (H) 01/12/2023 1139   ALT 29 01/12/2023  1139   ALKPHOS 68 01/12/2023 1139   BILITOT 1.3 (H) 01/12/2023 1139   GFRNONAA >60 07/28/2022 1042   GFRAA >60 08/27/2019 2039   Lab Results  Component Value Date   CHOL 133 01/12/2023   HDL 73.50 01/12/2023   LDLCALC 49 01/12/2023   TRIG 54.0 01/12/2023   CHOLHDL 2 01/12/2023   Lab Results  Component Value Date   HGBA1C 4.8 01/12/2023   Lab Results  Component Value Date   VITAMINB12 168 (L) 01/12/2023   Lab Results  Component Value Date   TSH 0.34 (L) 01/12/2023      ASSESSMENT AND PLAN 77 y.o. year old male  has a past medical history of Anxiety and depression, Aortic insufficiency (03/11/2022), Arthritis, Bipolar disorder (HCC), CAD (coronary artery disease), Chicken pox, 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HCC), Grief, History of echocardiogram, History of non-ST elevation myocardial infarction (NSTEMI), History of nuclear stress test, History of pneumonia (2011), History of prostatitis (1990s), adenomatous colonic polyps (06/22/2016), Hyperlipidemia, Hypertension, Migraines, and PTSD (post-traumatic stress disorder). here with:  Amnesia memory loss  MMSE 30/30 Memory score and symptoms have improved Patient does not want to do any further testing Will follow up if symptoms worsen or he develops new symptoms   I spent 20 minutes of face-to-face and non-face-to-face time with patient.  This included previsit  chart review, lab review, study review, order entry, electronic health record documentation, patient education.  Butch Penny, MSN, NP-C 05/15/2023, 4:11 PM [facility] [address], [address] [place], [place] [address] [phone]

## 2023-05-16 ENCOUNTER — Encounter: Payer: Self-pay | Admitting: Adult Health

## 2023-05-16 ENCOUNTER — Ambulatory Visit: Payer: Medicare Other | Admitting: Adult Health

## 2023-05-16 VITALS — BP 141/77 | HR 70 | Ht 71.0 in | Wt 164.0 lb

## 2023-05-16 DIAGNOSIS — M79671 Pain in right foot: Secondary | ICD-10-CM

## 2023-05-16 DIAGNOSIS — R202 Paresthesia of skin: Secondary | ICD-10-CM | POA: Diagnosis not present

## 2023-05-16 NOTE — Patient Instructions (Signed)
Try topical cream.  Order sent to [facility]. If your symptoms worsen or you develop new symptoms please let us know.

## 2023-05-17 NOTE — Progress Notes (Addendum)
6A 240gm [facility] form completed and faxed back to [phone]. Confirmation received

## 2023-05-18 ENCOUNTER — Ambulatory Visit: Payer: Medicare Other | Admitting: Internal Medicine

## 2023-05-21 ENCOUNTER — Encounter (HOSPITAL_COMMUNITY): Payer: Self-pay | Admitting: Licensed Clinical Social Worker

## 2023-05-21 NOTE — Progress Notes (Signed)
Virtual Visit via Video Note  I connected with Justin [name] on 04/04/23 at  3:30 PM EDT by a video enabled telemedicine application and verified that I am speaking with the correct person using two identifiers.  Location: Patient: home Provider: home office   I discussed the limitations of evaluation and management by telemedicine and the availability of in person appointments. The patient expressed understanding and agreed to proceed.   I discussed the assessment and treatment plan with the patient. The patient was provided an opportunity to ask questions and all were answered. The patient agreed with the plan and demonstrated an understanding of the instructions.   The patient was advised to call back or seek an in-person evaluation if the symptoms worsen or if the condition fails to improve as anticipated.  I provided 45 minutes of non-face-to-face time during this encounter.   [name], LCSW   THERAPIST PROGRESS NOTE  Session Time: 3:30pm-4:15pm  Participation Level: Active  Behavioral Response: Well GroomedAlertDepressed  Type of Therapy: Individual Therapy  Treatment Goals addressed: LTG: Reduce frequency, intensity, and duration of depression symptoms so that daily functioning is improved   ProgressTowards Goals: Progressing  Interventions: Motivational Interviewing  Summary: Justin [name] is a 77 y.o. male who presents with Bipolar I, mixed moderate.   Suicidal/Homicidal: Nowithout intent/plan  Therapist Response: [name] engaged well in individual virtual session with Facilities manager. Clinician utilized MI OARS to reflect and summarize thoughts and feelings. Clinician explored updates with friends and family. Clinician reflected ongoing frustration with neighbors, son, and sister. Clinician explored ways to set limits and expectations with his support system. Clinician provided assistance in wording limits with compliment sandwich. Clinician also noted the  challenge in finding an easy medium between himself and his family/friends, so that he can be there for them and they can be there for him.   Plan: Return again in 2 weeks.  Diagnosis: Bipolar 1 disorder, mixed, moderate (HCC)  Collaboration of Care: Medication Management AEB updated Dr. Lolly Mustache  Patient/Guardian was advised Release of Information must be obtained prior to any record release in order to collaborate their care with an outside provider. Patient/Guardian was advised if they have not already done so to contact the registration department to sign all necessary forms in order for [place] to release information regarding their care.   Consent: Patient/Guardian gives verbal consent for treatment and assignment of benefits for services provided during this visit. Patient/Guardian expressed understanding and agreed to proceed.   Chryl Heck [place], LCSW 05/21/2023

## 2023-05-21 NOTE — Progress Notes (Signed)
Virtual Visit via Video Note  I connected with Justin [name] on 03/30/23 at  2:00 PM EDT by a video enabled telemedicine application and verified that I am speaking with the correct person using two identifiers.  Location: Patient: home Provider: home office   I discussed the limitations of evaluation and management by telemedicine and the availability of in person appointments. The patient expressed understanding and agreed to proceed.   I discussed the assessment and treatment plan with the patient. The patient was provided an opportunity to ask questions and all were answered. The patient agreed with the plan and demonstrated an understanding of the instructions.   The patient was advised to call back or seek an in-person evaluation if the symptoms worsen or if the condition fails to improve as anticipated.  I provided 45 minutes of non-face-to-face time during this encounter.   [name], LCSW   THERAPIST PROGRESS NOTE  Session Time: 2:00pm-2:45pm  Participation Level: Active  Behavioral Response: NeatAlertDysphoric  Type of Therapy: Individual Therapy  Treatment Goals addressed: LTG: Reduce frequency, intensity, and duration of depression symptoms so that daily functioning is improved   ProgressTowards Goals: Progressing  Interventions: CBT  Summary: Justin [name] is a 77 y.o. male who presents with Bipolar I, mixed, moderate.   Suicidal/Homicidal: Nowithout intent/plan  Therapist Response: [name] engaged well in individual virtual session with Facilities manager. Clinician utilized CBT to process thoughts, feelings, and behaviors. Clinician explored interactions with friends and family. Clinician noted ongoing challenges in communicating limits, being able to share his feelings, and helping his family to understand what he goes through with these mixed episodes. Clinician offered that it is more importance for Justin [name] to communicate when these episodes occur, rather for them  to understand exactly what they are. Clinician also encouraged Justin [name] to do some reading and learning about his Bipolar to help communicate better.   Plan: Return again in 2 weeks.  Diagnosis: Bipolar 1 disorder, mixed, moderate (HCC)  Collaboration of Care: Patient refused AEB none required  Patient/Guardian was advised Release of Information must be obtained prior to any record release in order to collaborate their care with an outside provider. Patient/Guardian was advised if they have not already done so to contact the registration department to sign all necessary forms in order for [place] to release information regarding their care.   Consent: Patient/Guardian gives verbal consent for treatment and assignment of benefits for services provided during this visit. Patient/Guardian expressed understanding and agreed to proceed.   Chryl Heck [place], LCSW 05/21/2023

## 2023-05-22 ENCOUNTER — Ambulatory Visit: Payer: Medicare Other | Admitting: Internal Medicine

## 2023-05-22 ENCOUNTER — Telehealth: Payer: Self-pay | Admitting: *Deleted

## 2023-05-22 NOTE — Telephone Encounter (Signed)
Received fax from [facility]. Formula has been substituted from original order.  Formula:  Meloxicam 0.5%, Doxepin 3%, Amantadine 3%, Dextromethorphan 2%, Lidocaine 2%.

## 2023-05-25 NOTE — H&P (View-Only) (Signed)
Electrophysiology Office Follow up Visit Note:    Date:  05/26/2023   ID:  Justin [name], DOB 1946-07-13, MRN [id number]  PCP:  Corwin Levins, MD  [facility] Cardiologist:  Tonny Bollman, MD  [facility] Electrophysiologist:  Lanier Prude, MD    Interval History:    Justin [name] is a 77 y.o. male who presents for a follow up visit.   He presents for follow-up after redo catheter ablation on February 14, 2023 for his atrial fibrillation.  The patient required redo PVI, redo isolation of the posterior wall and ablation of bilateral pulmonary vein carina. He still Clide Cliff in follow-up on March 14, 2023 and was maintaining sinus rhythm.  He takes Eliquis for stroke prophylaxis.  Today he tells me he has done quite well from a rhythm standpoint.  He has not had any episodes of atrial fibrillation.  He reports an increased energy level.  He does tell me that he had an episode of chest pain a couple of weeks ago that lasted for several minutes and continued to worsen until he took a nitroglycerin.  When he took the nitroglycerin tablet his pain subsided completely.  He was short of breath during the episode.  No loss of consciousness or presyncope.  No arrhythmias during the episode.  The discomfort was in the setting of activity.  It was a crampy, pressure-like sensation in the central chest.    Past medical, surgical, social and family history were reviewed.  ROS:   Please see the history of present illness.    All other systems reviewed and are negative.  EKGs/Labs/Other Studies Reviewed:    The following studies were reviewed today:  Lexi scan in June of this year showed no ischemia.  EKG Interpretation Date/Time:  Friday May 26 2023 11:30:50 EDT Ventricular Rate:  81 PR Interval:  182 QRS Duration:  94 QT Interval:  380 QTC Calculation: 441 R Axis:   -47  Text Interpretation: Sinus rhythm with frequent Premature ventricular complexes Left anterior fascicular  block Confirmed by Steffanie Dunn [phone] on 05/26/2023 11:36:23 AM    Physical Exam:    VS:  BP 124/80   Pulse 81   Ht 5\' 11"  (1.803 m)   Wt 163 lb (73.9 kg)   SpO2 97%   BMI 22.73 kg/m     Wt Readings from Last 3 Encounters:  05/26/23 163 lb (73.9 kg)  05/16/23 164 lb (74.4 kg)  03/14/23 164 lb (74.4 kg)     GEN:  Well nourished, well developed in no acute distress CARDIAC: RRR, no murmurs, rubs, gallops RESPIRATORY:  Clear to auscultation without rales, wheezing or rhonchi       ASSESSMENT:    1. Persistent atrial fibrillation (HCC)   2. Primary hypertension   3. Typical angina (HCC)   4. Coronary artery disease due to calcified coronary lesion    PLAN:    In order of problems listed above:  #Persistent atrial fibrillation Maintaining sinus rhythm after redo catheter ablation in June 2024. Continue Eliquis for stroke prophylaxis  #Hypertension At goal today.  Recommend checking blood pressures 1-2 times per week at home and recording the values.  Recommend bringing these recordings to the primary care physician.  #Chest pain Typical sounding angina.  I am concerned that his recent Lexiscan may have been a false negative.  He has significant coronary artery calcium seen on prior CT scan.  I would like him to have a heart catheterization to assess  his coronaries.  I discussed the heart cath procedure with the patient and he is willing to proceed.  Will get this ordered for him.  Follow-up 1 year with APP.     Signed, Steffanie Dunn, MD, [facility] 05/26/2023 11:47 AM    Electrophysiology [facility]

## 2023-05-25 NOTE — Progress Notes (Signed)
Electrophysiology Office Follow up Visit Note:    Date:  05/26/2023   ID:  Justin [name], DOB 1946-07-13, MRN [id number]  PCP:  Corwin Levins, MD  [facility] Cardiologist:  Tonny Bollman, MD  [facility] Electrophysiologist:  Lanier Prude, MD    Interval History:    Justin [name] is a 77 y.o. male who presents for a follow up visit.   He presents for follow-up after redo catheter ablation on February 14, 2023 for his atrial fibrillation.  The patient required redo PVI, redo isolation of the posterior wall and ablation of bilateral pulmonary vein carina. He still Clide Cliff in follow-up on March 14, 2023 and was maintaining sinus rhythm.  He takes Eliquis for stroke prophylaxis.  Today he tells me he has done quite well from a rhythm standpoint.  He has not had any episodes of atrial fibrillation.  He reports an increased energy level.  He does tell me that he had an episode of chest pain a couple of weeks ago that lasted for several minutes and continued to worsen until he took a nitroglycerin.  When he took the nitroglycerin tablet his pain subsided completely.  He was short of breath during the episode.  No loss of consciousness or presyncope.  No arrhythmias during the episode.  The discomfort was in the setting of activity.  It was a crampy, pressure-like sensation in the central chest.    Past medical, surgical, social and family history were reviewed.  ROS:   Please see the history of present illness.    All other systems reviewed and are negative.  EKGs/Labs/Other Studies Reviewed:    The following studies were reviewed today:  Lexi scan in June of this year showed no ischemia.  EKG Interpretation Date/Time:  Friday May 26 2023 11:30:50 EDT Ventricular Rate:  81 PR Interval:  182 QRS Duration:  94 QT Interval:  380 QTC Calculation: 441 R Axis:   -47  Text Interpretation: Sinus rhythm with frequent Premature ventricular complexes Left anterior fascicular  block Confirmed by Steffanie Dunn [phone]) on 05/26/2023 11:36:23 AM    Physical Exam:    VS:  BP 124/80   Pulse 81   Ht 5\' 11"  (1.803 m)   Wt 163 lb (73.9 kg)   SpO2 97%   BMI 22.73 kg/m     Wt Readings from Last 3 Encounters:  05/26/23 163 lb (73.9 kg)  05/16/23 164 lb (74.4 kg)  03/14/23 164 lb (74.4 kg)     GEN:  Well nourished, well developed in no acute distress CARDIAC: RRR, no murmurs, rubs, gallops RESPIRATORY:  Clear to auscultation without rales, wheezing or rhonchi       ASSESSMENT:    1. Persistent atrial fibrillation (HCC)   2. Primary hypertension   3. Typical angina (HCC)   4. Coronary artery disease due to calcified coronary lesion    PLAN:    In order of problems listed above:  #Persistent atrial fibrillation Maintaining sinus rhythm after redo catheter ablation in June 2024. Continue Eliquis for stroke prophylaxis  #Hypertension At goal today.  Recommend checking blood pressures 1-2 times per week at home and recording the values.  Recommend bringing these recordings to the primary care physician.  #Chest pain Typical sounding angina.  I am concerned that his recent Lexiscan may have been a false negative.  He has significant coronary artery calcium seen on prior CT scan.  I would like him to have a heart catheterization to assess  his coronaries.  I discussed the heart cath procedure with the patient and he is willing to proceed.  Will get this ordered for him.  Follow-up 1 year with APP.     Signed, Steffanie Dunn, MD, [facility] 05/26/2023 11:47 AM    Electrophysiology [facility]

## 2023-05-26 ENCOUNTER — Ambulatory Visit: Payer: Medicare Other | Attending: Cardiology | Admitting: Cardiology

## 2023-05-26 ENCOUNTER — Encounter: Payer: Self-pay | Admitting: Cardiology

## 2023-05-26 VITALS — BP 124/80 | HR 81 | Ht 71.0 in | Wt 163.0 lb

## 2023-05-26 DIAGNOSIS — I2584 Coronary atherosclerosis due to calcified coronary lesion: Secondary | ICD-10-CM | POA: Diagnosis not present

## 2023-05-26 DIAGNOSIS — I10 Essential (primary) hypertension: Secondary | ICD-10-CM

## 2023-05-26 DIAGNOSIS — I2510 Atherosclerotic heart disease of native coronary artery without angina pectoris: Secondary | ICD-10-CM

## 2023-05-26 DIAGNOSIS — I4819 Other persistent atrial fibrillation: Secondary | ICD-10-CM

## 2023-05-26 DIAGNOSIS — I209 Angina pectoris, unspecified: Secondary | ICD-10-CM

## 2023-05-26 NOTE — Patient Instructions (Addendum)
Medication Instructions:  Your physician recommends that you continue on your current medications as directed. Please refer to the Current Medication list given to you today.  *If you need a refill on your cardiac medications before your next appointment, please call your pharmacy*  Lab Work: BMET and CBC  Testing/Procedures: Your physician has requested that you have a cardiac catheterization. Cardiac catheterization is used to diagnose and/or treat various heart conditions. Doctors may recommend this procedure for a number of different reasons. The most common reason is to evaluate chest pain. Chest pain can be a symptom of coronary artery disease (CAD), and cardiac catheterization can show whether plaque is narrowing or blocking your heart's arteries. This procedure is also used to evaluate the valves, as well as measure the blood flow and oxygen levels in different parts of your heart. For further information please visit [email or web address] Please follow instruction sheet, as given.  Follow-Up: At [facility], you and your health needs are our priority.  As part of our continuing mission to provide you with exceptional heart care, we have created designated Provider Care Teams.  These Care Teams include your primary Cardiologist (physician) and Advanced Practice Providers (APPs -  Physician Assistants and Nurse Practitioners) who all work together to provide you with the care you need, when you need it.  Your next appointment:   1 month  Provider:   Tonny Bollman, MD or APP  Follow up with Dr. Lalla Brothers in one year

## 2023-05-27 LAB — BASIC METABOLIC PANEL WITH GFR
BUN/Creatinine Ratio: 16 (ref 10–24)
BUN: 13 mg/dL (ref 8–27)
CO2: 25 mmol/L (ref 20–29)
Calcium: 9.3 mg/dL (ref 8.6–10.2)
Chloride: 102 mmol/L (ref 96–106)
Creatinine, Ser: 0.82 mg/dL (ref 0.76–1.27)
Glucose: 105 mg/dL — ABNORMAL HIGH (ref 70–99)
Potassium: 4.8 mmol/L (ref 3.5–5.2)
Sodium: 138 mmol/L (ref 134–144)
eGFR: 90 mL/min/1.73 (ref >59)

## 2023-05-27 LAB — CBC
Hematocrit: 40.8 % (ref 37.5–51.0)
Hemoglobin: 13 g/dL (ref 13.0–17.7)
MCH: 31.8 pg (ref 26.6–33.0)
MCHC: 31.9 g/dL (ref 31.5–35.7)
MCV: 100 fL — ABNORMAL HIGH (ref 79–97)
Platelets: 224 10*3/uL (ref 150–450)
RBC: 4.09 x10E6/uL — ABNORMAL LOW (ref 4.14–5.80)
RDW: 11.6 % (ref 11.6–15.4)
WBC: 4.6 10*3/uL (ref 3.4–10.8)

## 2023-05-29 ENCOUNTER — Telehealth: Payer: Self-pay | Admitting: Cardiology

## 2023-05-29 NOTE — Telephone Encounter (Signed)
Rescheduled patient heart cath to 10/24 with Dr. Excell Seltzer his cardiologist. Patient will have to stay the night since he cannot find anyone to stay with him for 24 hours after he arrives home. Patient had lab work done on 05/26/23, and he had OV  pn 05/26/23 which is within 30 days.

## 2023-05-29 NOTE — Telephone Encounter (Signed)
Patient would like to r/s their heart cath procedure for 10/24 due to financial reasons. Please advise.

## 2023-06-14 ENCOUNTER — Telehealth: Payer: Self-pay | Admitting: *Deleted

## 2023-06-14 ENCOUNTER — Ambulatory Visit (HOSPITAL_COMMUNITY): Payer: Medicare Other | Admitting: Licensed Clinical Social Worker

## 2023-06-14 DIAGNOSIS — F3162 Bipolar disorder, current episode mixed, moderate: Secondary | ICD-10-CM | POA: Diagnosis not present

## 2023-06-14 NOTE — Telephone Encounter (Signed)
Cardiac Catheterization scheduled at [facility] for: Thursday June 15, 2023 10:30 AM Arrival time [facility] Main Entrance A at: 8:30 AM  Nothing to eat after midnight prior to procedure, clear liquids until 5 AM day of procedure.  Medication instructions: -Hold:  Xarelto-pt reports none starting 06/13/23 and knows to hold until post procedure -Other usual morning medications can be taken with sips of water including aspirin 81 mg.  Patient reports he will have transportation, but does not have responsible adult to be with him the first 24 hours home if same day discharge, knows he will need to plan to stay overnight at the hospital.

## 2023-06-15 ENCOUNTER — Other Ambulatory Visit: Payer: Self-pay

## 2023-06-15 ENCOUNTER — Ambulatory Visit (HOSPITAL_COMMUNITY)
Admission: RE | Admit: 2023-06-15 | Discharge: 2023-06-16 | Disposition: A | Discharge status: Home / Self Care | Payer: Medicare Other | Attending: Cardiovascular Disease | Admitting: Cardiovascular Disease

## 2023-06-15 ENCOUNTER — Ambulatory Visit (HOSPITAL_COMMUNITY)
Admission: RE | Disposition: A | Discharge status: Home / Self Care | Payer: Self-pay | Source: Home / Self Care | Attending: Cardiovascular Disease

## 2023-06-15 ENCOUNTER — Encounter (HOSPITAL_COMMUNITY): Payer: Self-pay | Admitting: Licensed Clinical Social Worker

## 2023-06-15 DIAGNOSIS — I10 Essential (primary) hypertension: Secondary | ICD-10-CM | POA: Diagnosis present

## 2023-06-15 DIAGNOSIS — D6869 Other thrombophilia: Secondary | ICD-10-CM | POA: Insufficient documentation

## 2023-06-15 DIAGNOSIS — I25119 Atherosclerotic heart disease of native coronary artery with unspecified angina pectoris: Secondary | ICD-10-CM | POA: Diagnosis not present

## 2023-06-15 DIAGNOSIS — I25118 Atherosclerotic heart disease of native coronary artery with other forms of angina pectoris: Secondary | ICD-10-CM | POA: Diagnosis present

## 2023-06-15 DIAGNOSIS — Z7901 Long term (current) use of anticoagulants: Secondary | ICD-10-CM | POA: Insufficient documentation

## 2023-06-15 DIAGNOSIS — Z23 Encounter for immunization: Secondary | ICD-10-CM | POA: Diagnosis not present

## 2023-06-15 DIAGNOSIS — Z7982 Long term (current) use of aspirin: Secondary | ICD-10-CM | POA: Insufficient documentation

## 2023-06-15 DIAGNOSIS — Z79899 Other long term (current) drug therapy: Secondary | ICD-10-CM | POA: Insufficient documentation

## 2023-06-15 DIAGNOSIS — F32A Depression, unspecified: Secondary | ICD-10-CM | POA: Insufficient documentation

## 2023-06-15 DIAGNOSIS — I2584 Coronary atherosclerosis due to calcified coronary lesion: Secondary | ICD-10-CM | POA: Insufficient documentation

## 2023-06-15 DIAGNOSIS — I252 Old myocardial infarction: Secondary | ICD-10-CM | POA: Insufficient documentation

## 2023-06-15 DIAGNOSIS — Z7902 Long term (current) use of antithrombotics/antiplatelets: Secondary | ICD-10-CM | POA: Insufficient documentation

## 2023-06-15 DIAGNOSIS — I4819 Other persistent atrial fibrillation: Secondary | ICD-10-CM | POA: Insufficient documentation

## 2023-06-15 DIAGNOSIS — Z955 Presence of coronary angioplasty implant and graft: Secondary | ICD-10-CM | POA: Diagnosis not present

## 2023-06-15 DIAGNOSIS — F419 Anxiety disorder, unspecified: Secondary | ICD-10-CM | POA: Diagnosis not present

## 2023-06-15 DIAGNOSIS — E785 Hyperlipidemia, unspecified: Secondary | ICD-10-CM | POA: Diagnosis not present

## 2023-06-15 HISTORY — PX: CORONARY STENT INTERVENTION: CATH118234

## 2023-06-15 HISTORY — PX: LEFT HEART CATH AND CORONARY ANGIOGRAPHY: CATH118249

## 2023-06-15 LAB — GLUCOSE, CAPILLARY
Glucose-Capillary: 88 mg/dL (ref 70–99)
Glucose-Capillary: 137 mg/dL — ABNORMAL HIGH (ref 70–99)
Glucose-Capillary: 93 mg/dL (ref 70–99)

## 2023-06-15 LAB — POCT ACTIVATED CLOTTING TIME
Activated Clotting Time: 244 s
Activated Clotting Time: 250 s
Activated Clotting Time: 562 s

## 2023-06-15 SURGERY — LEFT HEART CATH AND CORONARY ANGIOGRAPHY
Anesthesia: LOCAL

## 2023-06-15 MED ORDER — NITROGLYCERIN 1 MG/10 ML FOR IR/CATH LAB
INTRA_ARTERIAL | Status: AC
  Filled 2023-06-15: qty 10

## 2023-06-15 MED ORDER — VERAPAMIL HCL 2.5 MG/ML IV SOLN
INTRAVENOUS | Status: AC
  Filled 2023-06-15: qty 2

## 2023-06-15 MED ORDER — HYDRALAZINE HCL 20 MG/ML IJ SOLN: 10.0000 mg | INTRAMUSCULAR | Status: AC | PRN

## 2023-06-15 MED ORDER — FAMOTIDINE IN NACL 20-0.9 MG/50ML-% IV SOLN
INTRAVENOUS | Status: AC
  Filled 2023-06-15: qty 50

## 2023-06-15 MED ORDER — LAMOTRIGINE 100 MG PO TABS
200.0000 mg | ORAL_TABLET | Freq: Every day | ORAL | Status: DC
  Administered 2023-06-16: 200 mg via ORAL
  Filled 2023-06-15: qty 2

## 2023-06-15 MED ORDER — SODIUM CHLORIDE 0.9 % WEIGHT BASED INFUSION: 1.0000 mL/kg/h | INTRAVENOUS | Status: DC

## 2023-06-15 MED ORDER — FENTANYL CITRATE (PF) 100 MCG/2ML IJ SOLN
INTRAMUSCULAR | Status: AC
  Filled 2023-06-15: qty 2

## 2023-06-15 MED ORDER — LIDOCAINE HCL (PF) 1 % IJ SOLN
INTRAMUSCULAR | Status: DC | PRN
  Administered 2023-06-15: 2 mL

## 2023-06-15 MED ORDER — ROSUVASTATIN CALCIUM 20 MG PO TABS
20.0000 mg | ORAL_TABLET | Freq: Every day | ORAL | Status: DC
  Administered 2023-06-16: 20 mg via ORAL
  Filled 2023-06-15: qty 1

## 2023-06-15 MED ORDER — CLOPIDOGREL BISULFATE 75 MG PO TABS
75.0000 mg | ORAL_TABLET | Freq: Every day | ORAL | Status: DC
  Administered 2023-06-16: 75 mg via ORAL
  Filled 2023-06-15: qty 1

## 2023-06-15 MED ORDER — HEPARIN SODIUM (PORCINE) 1000 UNIT/ML IJ SOLN
INTRAMUSCULAR | Status: AC
  Filled 2023-06-15: qty 10

## 2023-06-15 MED ORDER — RIVAROXABAN 20 MG PO TABS
20.0000 mg | ORAL_TABLET | Freq: Every day | ORAL | Status: DC
  Administered 2023-06-16: 20 mg via ORAL
  Filled 2023-06-15: qty 1

## 2023-06-15 MED ORDER — PANTOPRAZOLE SODIUM 40 MG PO TBEC
40.0000 mg | DELAYED_RELEASE_TABLET | Freq: Every day | ORAL | Status: DC
  Administered 2023-06-16: 40 mg via ORAL
  Filled 2023-06-15: qty 1

## 2023-06-15 MED ORDER — OXYCODONE HCL 5 MG PO TABS: 5.0000 mg | ORAL_TABLET | ORAL | Status: DC | PRN

## 2023-06-15 MED ORDER — DILTIAZEM HCL ER COATED BEADS 180 MG PO CP24
180.0000 mg | ORAL_CAPSULE | Freq: Every day | ORAL | Status: DC
  Administered 2023-06-16: 180 mg via ORAL
  Filled 2023-06-15: qty 1

## 2023-06-15 MED ORDER — INFLUENZA VAC A&B SURF ANT ADJ 0.5 ML IM SUSY
0.5000 mL | PREFILLED_SYRINGE | INTRAMUSCULAR | Status: AC
  Administered 2023-06-16: 0.5 mL via INTRAMUSCULAR
  Filled 2023-06-15: qty 0.5

## 2023-06-15 MED ORDER — CLOPIDOGREL BISULFATE 300 MG PO TABS
ORAL_TABLET | ORAL | Status: DC | PRN
  Administered 2023-06-15: 600 mg via ORAL

## 2023-06-15 MED ORDER — SODIUM CHLORIDE 0.9 % WEIGHT BASED INFUSION
3.0000 mL/kg/h | INTRAVENOUS | Status: DC
  Administered 2023-06-15: 3 mL/kg/h via INTRAVENOUS

## 2023-06-15 MED ORDER — LABETALOL HCL 5 MG/ML IV SOLN
10.0000 mg | INTRAVENOUS | Status: AC | PRN
Start: 2023-06-15 — End: 2023-06-15

## 2023-06-15 MED ORDER — ALPRAZOLAM 0.5 MG PO TABS: 1.0000 mg | ORAL_TABLET | Freq: Every day | ORAL | Status: DC | PRN

## 2023-06-15 MED ORDER — SODIUM CHLORIDE 0.9% FLUSH
3.0000 mL | Freq: Two times a day (BID) | INTRAVENOUS | Status: DC
  Administered 2023-06-16: 3 mL via INTRAVENOUS

## 2023-06-15 MED ORDER — SODIUM CHLORIDE 0.9 % WEIGHT BASED INFUSION
1.0000 mL/kg/h | INTRAVENOUS | Status: AC
Start: 2023-06-15 — End: 2023-06-15

## 2023-06-15 MED ORDER — ACETAMINOPHEN 325 MG PO TABS
ORAL_TABLET | ORAL | Status: AC
  Filled 2023-06-15: qty 2

## 2023-06-15 MED ORDER — NITROGLYCERIN 1 MG/10 ML FOR IR/CATH LAB
INTRA_ARTERIAL | Status: DC | PRN
  Administered 2023-06-15 (×2): 150 ug

## 2023-06-15 MED ORDER — FENTANYL CITRATE (PF) 100 MCG/2ML IJ SOLN
INTRAMUSCULAR | Status: DC | PRN
  Administered 2023-06-15 (×3): 25 ug via INTRAVENOUS

## 2023-06-15 MED ORDER — SODIUM CHLORIDE 0.9 % IV SOLN: 250.0000 mL | INTRAVENOUS | Status: DC | PRN

## 2023-06-15 MED ORDER — VERAPAMIL HCL 2.5 MG/ML IV SOLN: INTRAVENOUS | Status: DC | PRN

## 2023-06-15 MED ORDER — MIDAZOLAM HCL 2 MG/2ML IJ SOLN
INTRAMUSCULAR | Status: DC | PRN
  Administered 2023-06-15 (×2): 1 mg via INTRAVENOUS
  Administered 2023-06-15: 2 mg via INTRAVENOUS

## 2023-06-15 MED ORDER — CLOPIDOGREL BISULFATE 300 MG PO TABS
ORAL_TABLET | ORAL | Status: AC
  Filled 2023-06-15: qty 2

## 2023-06-15 MED ORDER — ACETAMINOPHEN 325 MG PO TABS
650.0000 mg | ORAL_TABLET | ORAL | Status: DC | PRN
  Administered 2023-06-15: 650 mg via ORAL

## 2023-06-15 MED ORDER — ISOSORBIDE MONONITRATE ER 30 MG PO TB24
30.0000 mg | ORAL_TABLET | Freq: Every day | ORAL | Status: DC
  Administered 2023-06-16: 30 mg via ORAL
  Filled 2023-06-15: qty 1

## 2023-06-15 MED ORDER — ONDANSETRON HCL 4 MG/2ML IJ SOLN: 4.0000 mg | Freq: Four times a day (QID) | INTRAMUSCULAR | Status: DC | PRN

## 2023-06-15 MED ORDER — IOHEXOL 350 MG/ML SOLN
INTRAVENOUS | Status: DC | PRN
  Administered 2023-06-15: 125 mL

## 2023-06-15 MED ORDER — NITROGLYCERIN 0.4 MG SL SUBL: 0.4000 mg | SUBLINGUAL_TABLET | SUBLINGUAL | Status: DC | PRN

## 2023-06-15 MED ORDER — SODIUM CHLORIDE 0.9% FLUSH: 3.0000 mL | INTRAVENOUS | Status: DC | PRN

## 2023-06-15 MED ORDER — FAMOTIDINE IN NACL 20-0.9 MG/50ML-% IV SOLN
INTRAVENOUS | Status: DC | PRN
  Administered 2023-06-15: 20 mg via INTRAVENOUS

## 2023-06-15 MED ORDER — ASPIRIN 81 MG PO CHEW
81.0000 mg | CHEWABLE_TABLET | Freq: Every day | ORAL | Status: DC
  Administered 2023-06-16: 81 mg via ORAL
  Filled 2023-06-15: qty 1

## 2023-06-15 MED ORDER — HEPARIN (PORCINE) IN NACL 1000-0.9 UT/500ML-% IV SOLN
INTRAVENOUS | Status: DC | PRN
  Administered 2023-06-15: 1000 mL

## 2023-06-15 MED ORDER — MIDAZOLAM HCL 2 MG/2ML IJ SOLN
INTRAMUSCULAR | Status: AC
  Filled 2023-06-15: qty 2

## 2023-06-15 MED ORDER — ASPIRIN 81 MG PO CHEW
81.0000 mg | CHEWABLE_TABLET | ORAL | Status: AC
  Administered 2023-06-15: 81 mg via ORAL

## 2023-06-15 MED ORDER — HEPARIN SODIUM (PORCINE) 1000 UNIT/ML IJ SOLN
INTRAMUSCULAR | Status: DC | PRN
  Administered 2023-06-15 (×3): 4000 [IU] via INTRAVENOUS

## 2023-06-15 MED ORDER — ENSURE ENLIVE PO LIQD
237.0000 mL | Freq: Two times a day (BID) | ORAL | Status: DC
  Administered 2023-06-16: 237 mL via ORAL

## 2023-06-15 MED ORDER — LIDOCAINE HCL (PF) 1 % IJ SOLN
INTRAMUSCULAR | Status: AC
  Filled 2023-06-15: qty 30

## 2023-06-15 SURGICAL SUPPLY — 17 items
BALL SAPPHIRE NC24 3.0X12 (BALLOONS) ×1
BALL SAPPHIRE NC24 3.75X8 (BALLOONS) ×1
BALLN EMERGE MR 2.5X12 (BALLOONS) ×1
BALLN EMERGE MR 3.0X12 (BALLOONS) ×1
CATH 5FR JL3.5 JR4 ANG PIG MP (CATHETERS) ×1
CATH VISTA GUIDE 6FR XBLAD4 (CATHETERS) ×1
DEVICE RAD COMP TR BAND LRG (VASCULAR PRODUCTS) ×1
ELECT DEFIB PAD ADLT CADENCE (PAD) ×1
GLIDESHEATH SLEND SS 6F .021 (SHEATH) ×1
INQWIRE 1.5J .035X260CM (WIRE) ×1
KIT ENCORE 26 ADVANTAGE (KITS) ×1
PACK CARDIAC CATHETERIZATION (CUSTOM PROCEDURE TRAY) ×1
SET ATX-X65L (MISCELLANEOUS) ×1
SYNERGY XD 3.0X24 (Permanent Stent) ×1 IMPLANT
SYNERGY XD 3.50X12 (Permanent Stent) ×1 IMPLANT
TUBING CIL FLEX 10 FLL-RA (TUBING) ×1
WIRE ASAHI PROWATER 180CM (WIRE) ×2

## 2023-06-15 NOTE — Plan of Care (Signed)
[facility] Tonsillectomy/Adenoidectomy, Postoperative PEDS care plan entered in error.

## 2023-06-15 NOTE — Progress Notes (Signed)
Virtual Visit via Video Note  I connected with Justin [name] on 06/14/23 at 10:00 AM EDT by a video enabled telemedicine application and verified that I am speaking with the correct person using two identifiers.  Location: Patient: home Provider: home office   I discussed the limitations of evaluation and management by telemedicine and the availability of in person appointments. The patient expressed understanding and agreed to proceed.   I discussed the assessment and treatment plan with the patient. The patient was provided an opportunity to ask questions and all were answered. The patient agreed with the plan and demonstrated an understanding of the instructions.   The patient was advised to call back or seek an in-person evaluation if the symptoms worsen or if the condition fails to improve as anticipated.  I provided 45 minutes of non-face-to-face time during this encounter.   Justin Melter, LCSW   THERAPIST PROGRESS NOTE  Session Time: 10:00am-10:45am  Participation Level: Active  Behavioral Response: NeatAlertEuthymic  Type of Therapy: Individual Therapy  Treatment Goals addressed:  LTG: Reduce frequency, intensity, and duration of depression symptoms so that daily functioning is improved   ProgressTowards Goals: Progressing  Interventions: CBT  Summary: Justin [name] is a 77 y.o. male who presents with Bipolar I, mixed.   Suicidal/Homicidal: Nowithout intent/plan  Therapist Response: Justin [name] engaged well in individual virtual session. Clinician utilized CBT to process thoughts, feelings, and interactions. Justin [name] shared improvement in his anxiety sxs due to reducing contact with neighbors. Clinician processed the challenges in that relationship and provided feedback about these struggles. Clinician explored updates about son, noting that he has been largely absent since wife came back home a few weeks ago. Clinician explored Justin [name]'s feelings about this and noted that  he would like to hear more from son and to be a part of his life, but it seems like son is choosing wife over Justin [name]. Clinician reflected hurt feelings and also locus of control. Clinician validated feelings and encouraged self care. Justin [name] shared he is going back for a Heart Cath tomorrow.   Plan: Return again in 2 weeks.  Diagnosis: Bipolar 1 disorder, mixed, moderate (HCC)  Collaboration of Care: Patient refused AEB none required  Patient/Guardian was advised Release of Information must be obtained prior to any record release in order to collaborate their care with an outside provider. Patient/Guardian was advised if they have not already done so to contact the registration department to sign all necessary forms in order for [place] to release information regarding their care.   Consent: Patient/Guardian gives verbal consent for treatment and assignment of benefits for services provided during this visit. Patient/Guardian expressed understanding and agreed to proceed.   Justin [name] [place], LCSW 06/15/2023

## 2023-06-15 NOTE — Interval H&P Note (Signed)
History and Physical Interval Note:  06/15/2023 10:37 AM  Justin [name]  has presented today for surgery, with the diagnosis of chest pain.  The various methods of treatment have been discussed with the patient and family. After consideration of risks, benefits and other options for treatment, the patient has consented to  Procedure(s): LEFT HEART CATH AND CORONARY ANGIOGRAPHY (N/A) as a surgical intervention.  The patient's history has been reviewed, patient examined, no change in status, stable for surgery.  I have reviewed the patient's chart and labs.  Questions were answered to the patient's satisfaction.     Tonny Bollman

## 2023-06-16 ENCOUNTER — Other Ambulatory Visit (HOSPITAL_COMMUNITY): Payer: Self-pay

## 2023-06-16 ENCOUNTER — Encounter (HOSPITAL_COMMUNITY): Payer: Self-pay | Admitting: Cardiovascular Disease

## 2023-06-16 DIAGNOSIS — E7800 Pure hypercholesterolemia, unspecified: Secondary | ICD-10-CM

## 2023-06-16 DIAGNOSIS — I10 Essential (primary) hypertension: Secondary | ICD-10-CM | POA: Diagnosis not present

## 2023-06-16 DIAGNOSIS — I25118 Atherosclerotic heart disease of native coronary artery with other forms of angina pectoris: Secondary | ICD-10-CM | POA: Diagnosis not present

## 2023-06-16 DIAGNOSIS — I480 Paroxysmal atrial fibrillation: Secondary | ICD-10-CM

## 2023-06-16 DIAGNOSIS — D6869 Other thrombophilia: Secondary | ICD-10-CM

## 2023-06-16 DIAGNOSIS — I4819 Other persistent atrial fibrillation: Secondary | ICD-10-CM | POA: Diagnosis not present

## 2023-06-16 DIAGNOSIS — I2584 Coronary atherosclerosis due to calcified coronary lesion: Secondary | ICD-10-CM | POA: Diagnosis not present

## 2023-06-16 LAB — BASIC METABOLIC PANEL
Anion gap: 7 (ref 5–15)
BUN: 10 mg/dL (ref 8–23)
CO2: 20 mmol/L — ABNORMAL LOW (ref 22–32)
Calcium: 8.6 mg/dL — ABNORMAL LOW (ref 8.9–10.3)
Chloride: 108 mmol/L (ref 98–111)
Creatinine, Ser: 0.85 mg/dL (ref 0.61–1.24)
GFR, Estimated: >60 mL/min (ref >60)
Glucose, Bld: 103 mg/dL — ABNORMAL HIGH (ref 70–99)
Potassium: 3.8 mmol/L (ref 3.5–5.1)
Sodium: 135 mmol/L (ref 135–145)

## 2023-06-16 LAB — CBC
HCT: 38.5 % — ABNORMAL LOW (ref 39.0–52.0)
Hemoglobin: 12.8 g/dL — ABNORMAL LOW (ref 13.0–17.0)
MCH: 31.7 pg (ref 26.0–34.0)
MCHC: 33.2 g/dL (ref 30.0–36.0)
MCV: 95.3 fL (ref 80.0–100.0)
Platelets: 194 10*3/uL (ref 150–400)
RBC: 4.04 MIL/uL — ABNORMAL LOW (ref 4.22–5.81)
RDW: 12.1 % (ref 11.5–15.5)
WBC: 5.6 10*3/uL (ref 4.0–10.5)
nRBC: 0 % (ref 0.0–0.2)

## 2023-06-16 LAB — GLUCOSE, CAPILLARY: Glucose-Capillary: 105 mg/dL — ABNORMAL HIGH (ref 70–99)

## 2023-06-16 MED ORDER — ASPIRIN 81 MG PO TBEC
81.0000 mg | DELAYED_RELEASE_TABLET | Freq: Every day | ORAL | 0 refills | Status: DC
  Filled 2023-06-16: qty 30, 30d supply, fill #0

## 2023-06-16 MED ORDER — PANTOPRAZOLE SODIUM 40 MG PO TBEC
40.0000 mg | DELAYED_RELEASE_TABLET | Freq: Every day | ORAL | 2 refills | Status: DC
  Filled 2023-06-16: qty 30, 30d supply, fill #0

## 2023-06-16 MED ORDER — CLOPIDOGREL BISULFATE 75 MG PO TABS
75.0000 mg | ORAL_TABLET | Freq: Every day | ORAL | 1 refills | Status: DC
  Filled 2023-06-16: qty 90, 90d supply, fill #0

## 2023-06-16 MED ORDER — ROSUVASTATIN CALCIUM 40 MG PO TABS
40.0000 mg | ORAL_TABLET | Freq: Every day | ORAL | 3 refills | Status: DC
  Filled 2023-06-16: qty 90, 90d supply, fill #0

## 2023-06-16 NOTE — Progress Notes (Signed)
CSW completed consult. Patient reports no concerns in regards to insurance. Patient currently has insurance. All questions answered. No further questions reported at this time.

## 2023-06-16 NOTE — Discharge Instructions (Addendum)
De-escalation of Triple Therapy Post-PCI  Underwent cardiac catheterization with placement of drug-eluting stent on 06/15/2023. Plan for triple therapy with aspirin 81 mg and clopidogrel (Plavix) in addition to oral anticoagulation using rivaroxaban (Xarelto). Plan to discontinue aspirin on 07/15/2023.      Information about your medication: Plavix (anti-platelet agent)  Generic Name (Brand): clopidogrel (Plavix), once daily medication  PURPOSE: You are taking this medication along with aspirin to lower your chance of having a heart attack, stroke, or blood clots in your heart stent. These can be fatal. Plavix and aspirin help prevent platelets from sticking together and forming a clot that can block an artery or your stent.   Common SIDE EFFECTS you may experience include: bruising or bleeding more easily, shortness of breath  Do not stop taking PLAVIX without talking to the doctor who prescribes it for you. People who are treated with a stent and stop taking Plavix too soon, have a higher risk of getting a blood clot in the stent, having a heart attack, or dying. If you stop Plavix because of bleeding, or for other reasons, your risk of a heart attack or stroke may increase.   Avoid taking NSAID agents or anti-inflammatory medications such as ibuprofen, naproxen given increased bleed risk with plavix - can use acetaminophen (Tylenol) if needed for pain.  Avoid taking over the counter stomach medications omeprazole (Prilosec) or esomeprazole (Nexium) since these do interact and make plavix less effective - ask your pharmacist or doctor for alterative agents if needed for heartburn or GERD.   Tell all of your doctors and dentists that you are taking Plavix. They should talk to the doctor who prescribed Plavix for you before you have any surgery or invasive procedure.   Contact your health care provider if you experience: severe or uncontrollable bleeding, pink/red/brown urine, vomiting blood or  vomit that looks like "coffee grounds", red or black stools (looks like tar), coughing up blood or blood clots ----------------------------------------------------------------------------------------------------------------------

## 2023-06-16 NOTE — Discharge Summary (Signed)
Discharge Summary    Patient ID: Justin [name] MRN: [id number]; DOB: 28-Mar-1946  Admit date: 06/15/2023 Discharge date: 06/16/2023  PCP:  Corwin Levins, MD   [facility] Providers Cardiologist:  Tonny Bollman, MD  Cardiology APP:  Beatrice Lecher, PA-C  Electrophysiologist:  Lanier Prude, MD       Discharge Diagnoses    Principal Problem:   Coronary artery disease with exertional angina [facility]) Active Problems:   Hypertension   Hyperlipidemia   Hypercoagulable state due to persistent atrial fibrillation [facility])   Diagnostic Studies/Procedures    LHC 06/15/23: 1.  Severe mid LAD stenosis treated successfully with PCI using a 3.0 x 24 mm Synergy DES 2.  Severe mid circumflex stenosis treated successfully with a 3.5 x 12 mm Synergy DES 3.  Patent LAD and diagonal stents with mild in-stent restenosis 4.  Patent RCA with mild nonobstructive plaquing and moderate PDA stenosis (large dominant RCA) 5.  Normal LVEDP   Recommendations: Recommend overnight observation as patient has nobody to stay with him.  Resume rivaroxaban tomorrow at home dose.  Continue clopidogrel x 6 months.  Continue aspirin x 4 weeks. _____________   History of Present Illness     Justin [name] is a 77 y.o. male with CAD, HTN, HLD, anxiety/depression, Afib on xarelto s/p ablation 04/2022 with recent redo ablation 02/14/23. CAD dates back to 2011 with DES LAD and diagonal 2011, NSTEMI 2012 no culprit on cath, NST 2019 & 2022 no ischemia. He reported chest pain during an office visit 01/2023 and underwent repeat nuclear stress test taht was nonischemic. At follow up after redo ablation, he reported nitroglycerine responsive CP and heart catheterization was arranged.  Hospital Course     Consultants: none  CAD Nitroglycerin-responsive chest pain prompting concern that recent nuclear stress test may have been a false negative. He proceeded to [facility] given coronary calcium seen on prior CT scan. LHC  with severe mid LAD stenosis of 80%  successfully treated with DES 3.0 x 24 mm and mid LCX with 75% stenosis successfully treated with 3.5 x 12 mm DES.  Mild ISR in mid LAD stent, mild diffuse disease along the RCA, treated medically. He was started on ASA and plavix with plans for ASA x 4 weeks, plavix x 6 months, continue xarelto today. He was observed overnight as he had no one to stay with him at home.    Hypertension Continue cardizem, imdur   Hyperlipidemia with LDL goal < 70 01/12/2023: Cholesterol 133; HDL 73.50; LDL Cholesterol 49; Triglycerides 54.0; VLDL 10.8 Increase 20 mg crestor to 40 mg. LP(a) pending. Given progression of disease and ISR, will increase crestor. If LP(a) elevated may need PCSK9i.   Persistent Atrial Fibrillation Recent redo catheter ablation with redo PCI, redo isolation of posterior wall and ablation of bilateral pulmonary vein carina.  Has been maintaining sinus rhythm since ablation.  Continue cardizem and resume xarelto today.    Pt seen and examined by myself and Dr. Jacinto Halim and deemed stable for discharge. Follow up has been arranged.   Antiplatelet regimen was described by both myself and pharmD - may need re-education at follow up.        Did the patient have an acute coronary syndrome (MI, NSTEMI, STEMI, etc) this admission?:  No                               Did the  patient have a percutaneous coronary intervention (stent / angioplasty)?:  Yes.     Cath/PCI Registry Performance & Quality Measures: Aspirin prescribed? - Yes ADP Receptor Inhibitor (Plavix/Clopidogrel, Brilinta/Ticagrelor or Effient/Prasugrel) prescribed (includes medically managed patients)? - Yes High Intensity Statin (Lipitor 40-80mg  or Crestor 20-40mg ) prescribed? - Yes For EF <40%, was ACEI/ARB prescribed? - Not Applicable (EF >/= 40%) For EF <40%, Aldosterone Antagonist (Spironolactone or Eplerenone) prescribed? - Not Applicable (EF >/= 40%) Cardiac Rehab Phase II ordered?  - Yes       The patient will be scheduled for a TOC follow up appointment in 14 days.  A message has been sent to the [facility] and Scheduling Pool at the office where the patient should be seen for follow up.  _____________  Discharge Vitals Blood pressure 123/72, pulse 73, temperature 98 F (36.7 C), temperature source Oral, resp. rate 16, height 5\' 11"  (1.803 m), weight 74.8 kg, SpO2 95%.  Filed Weights   06/15/23 0846  Weight: 74.8 kg   Physical Exam Constitutional:      Appearance: Normal appearance.  HENT:     Head: Atraumatic.  Eyes:     Extraocular Movements: Extraocular movements intact.  Cardiovascular:     Rate and Rhythm: Normal rate and regular rhythm.     Heart sounds: Normal heart sounds.  Pulmonary:     Effort: Pulmonary effort is normal.     Breath sounds: Normal breath sounds.  Abdominal:     General: Abdomen is flat.     Palpations: Abdomen is soft.  Musculoskeletal:     Cervical back: Normal range of motion.     Right lower leg: No edema.     Left lower leg: No edema.  Skin:    General: Skin is warm and dry.  Neurological:     Mental Status: He is alert and oriented to person, place, and time.  Psychiatric:        Mood and Affect: Mood normal.        Behavior: Behavior normal.    Right radial site C/D/I  Labs & Radiologic Studies    CBC Recent Labs    06/16/23 0648  WBC 5.6  HGB 12.8*  HCT 38.5*  MCV 95.3  PLT 194   Basic Metabolic Panel Recent Labs    13/24/40 0648  NA 135  K 3.8  CL 108  CO2 20*  GLUCOSE 103*  BUN 10  CREATININE 0.85  CALCIUM 8.6*   Liver Function Tests No results for input(s): "AST", "ALT", "ALKPHOS", "BILITOT", "PROT", "ALBUMIN" in the last 72 hours. No results for input(s): "LIPASE", "AMYLASE" in the last 72 hours. High Sensitivity Troponin:   No results for input(s): "TROPONINIHS" in the last 720 hours.  BNP Invalid input(s): "POCBNP" D-Dimer No results for input(s): "DDIMER" in the last 72  hours. Hemoglobin A1C No results for input(s): "HGBA1C" in the last 72 hours. Fasting Lipid Panel No results for input(s): "CHOL", "HDL", "LDLCALC", "TRIG", "CHOLHDL", "LDLDIRECT" in the last 72 hours. Thyroid Function Tests No results for input(s): "TSH", "T4TOTAL", "T3FREE", "THYROIDAB" in the last 72 hours.  Invalid input(s): "FREET3" _____________  CARDIAC CATHETERIZATION  Result Date: 06/15/2023 1.  Severe mid LAD stenosis treated successfully with PCI using a 3.0 x 24 mm Synergy DES 2.  Severe mid circumflex stenosis treated successfully with a 3.5 x 12 mm Synergy DES 3.  Patent LAD and diagonal stents with mild in-stent restenosis 4.  Patent RCA with mild nonobstructive plaquing and moderate PDA stenosis (  large dominant RCA) 5.  Normal LVEDP Recommendations: Recommend overnight observation as patient has nobody to stay with him.  Resume rivaroxaban tomorrow at home dose.  Continue clopidogrel x 6 months.  Continue aspirin x 4 weeks.   Disposition   Pt is being discharged home today in good condition.  Follow-up Plans & Appointments     Discharge Instructions     Amb Referral to Cardiac Rehabilitation   Complete by: As directed    Diagnosis:  Coronary Stents PTCA     After initial evaluation and assessments completed: Virtual Based Care may be provided alone or in conjunction with Phase 2 Cardiac Rehab based on patient barriers.: Yes   Intensive Cardiac Rehabilitation (ICR) [facility] location only OR Traditional Cardiac Rehabilitation (TCR) *If criteria for ICR are not met will enroll in TCR [facility] only): Yes        Discharge Medications   Allergies as of 06/16/2023       Reactions   Ambien [zolpidem Tartrate] Other (See Comments)   Blackout, memory issues   Codeine    Causes Memory issues   Amiodarone    dizziness   Ativan [lorazepam] Other (See Comments)   Made him feel completely out of it fell down   Gabapentin Other (See Comments)   Drowsy during the day   Lyrica  [pregabalin] Nausea Only   Amitriptyline Palpitations   Seroquel [quetiapine]    SEVERE NIGHTMARES, SLEEPWALK AND NIGHT DRIVE WITH NO RECOLLECTION UPON WAKENING        Medication List     TAKE these medications    ALPRAZolam 1 MG tablet Commonly known as: XANAX TAKE 1/2 TO 1 TABLET BY MOUTH ONCE DAILY What changed: See the new instructions.   aspirin EC 81 MG tablet Take 1 tablet (81 mg total) by mouth daily. Swallow whole.   clopidogrel 75 MG tablet Commonly known as: PLAVIX Take 1 tablet (75 mg total) by mouth daily with breakfast. Start taking on: June 17, 2023   diltiazem 180 MG 24 hr capsule Commonly known as: CARDIZEM CD TAKE 1 CAPSULE BY MOUTH DAILY   isosorbide mononitrate 30 MG 24 hr tablet Commonly known as: IMDUR TAKE 1 TABLET BY MOUTH DAILY   lamoTRIgine 200 MG tablet Commonly known as: LAMICTAL Take 1 tablet (200 mg total) by mouth daily.   nitroGLYCERIN 0.4 MG SL tablet Commonly known as: NITROSTAT Place 1 tablet (0.4 mg total) under the tongue every 5 (five) minutes as needed for chest pain.   pantoprazole 40 MG tablet Commonly known as: PROTONIX Take 1 tablet (40 mg total) by mouth daily.   rivaroxaban 20 MG Tabs tablet Commonly known as: XARELTO Take 1 tablet (20 mg total) by mouth daily.   rosuvastatin 40 MG tablet Commonly known as: CRESTOR Take 1 tablet (40 mg total) by mouth daily. Start taking on: June 17, 2023 What changed:  medication strength how much to take           Outstanding Labs/Studies     Duration of Discharge Encounter   Greater than 30 minutes including physician time.  Signed, Roe Rutherford Lido Maske, PA 06/16/2023, 10:30 AM

## 2023-06-16 NOTE — Progress Notes (Signed)
CARDIAC REHAB PHASE I   PRE:  Rate/Rhythm: 72 SR    BP: sitting 123/72    SpO2: 99 RA  MODE:  Ambulation: 400 ft   POST:  Rate/Rhythm: 86 SR    BP: sitting 132/83     SpO2: 96 RA  Tolerated well, no c/o. To recliner. Discussed stents, restrictions, Plavix importance, diet, exercise, NTG and CRPII. Pt receptive. Will refer to [facility].  [id number]   Justin [name] BS, ACSM-CEP 06/16/2023 9:39 AM

## 2023-06-19 LAB — LIPOPROTEIN A (LPA): Lipoprotein (a): 153.9 nmol/L — ABNORMAL HIGH (ref <75.0)

## 2023-06-19 MED FILL — Verapamil HCl IV Soln 2.5 MG/ML: INTRAVENOUS | Qty: 2 | Status: AC

## 2023-06-19 MED FILL — Nitroglycerin IV Soln 100 MCG/ML in D5W: INTRA_ARTERIAL | Qty: 10 | Status: AC

## 2023-06-19 MED FILL — Famotidine in NaCl 0.9% IV Soln 20 MG/50ML: INTRAVENOUS | Qty: 50 | Status: AC

## 2023-06-20 ENCOUNTER — Other Ambulatory Visit (HOSPITAL_COMMUNITY): Payer: Self-pay

## 2023-06-20 ENCOUNTER — Telehealth (HOSPITAL_COMMUNITY): Payer: Self-pay

## 2023-06-20 DIAGNOSIS — Z955 Presence of coronary angioplasty implant and graft: Secondary | ICD-10-CM

## 2023-06-20 NOTE — Telephone Encounter (Signed)
Per phase 1 Cardiac Rehab fax referral to [facility]

## 2023-06-21 ENCOUNTER — Encounter: Payer: Self-pay | Admitting: Internal Medicine

## 2023-06-21 ENCOUNTER — Ambulatory Visit: Payer: Medicare Other | Admitting: Internal Medicine

## 2023-06-21 VITALS — BP 130/60 | HR 81 | Temp 98.2°F | Ht 71.0 in | Wt 166.0 lb

## 2023-06-21 DIAGNOSIS — Z7901 Long term (current) use of anticoagulants: Secondary | ICD-10-CM | POA: Diagnosis not present

## 2023-06-21 DIAGNOSIS — K1120 Sialoadenitis, unspecified: Secondary | ICD-10-CM | POA: Diagnosis not present

## 2023-06-21 MED ORDER — KETOROLAC TROMETHAMINE 30 MG/ML IJ SOLN
30.0000 mg | Freq: Once | INTRAMUSCULAR | Status: AC
  Administered 2023-06-21: 30 mg via INTRAMUSCULAR

## 2023-06-21 MED ORDER — HYDROCODONE-ACETAMINOPHEN 5-325 MG PO TABS: 1.0000 | ORAL_TABLET | ORAL | 0 refills | Status: DC | PRN

## 2023-06-21 MED ORDER — CEFTRIAXONE SODIUM 500 MG IJ SOLR
500.0000 mg | Freq: Once | INTRAMUSCULAR | Status: AC
  Administered 2023-06-21: 500 mg via INTRAMUSCULAR

## 2023-06-21 MED ORDER — CEFDINIR 300 MG PO CAPS: 300.0000 mg | ORAL_CAPSULE | Freq: Two times a day (BID) | ORAL | 0 refills | Status: DC

## 2023-06-21 NOTE — Assessment & Plan Note (Addendum)
L side Swollen L submandibular and L sublingual - very painful Erythema and swelling under the tongue on the left Rocephin 500 mg IM Toradol 30 mg IM  Omnicef 300 mg bid Norco prn  Potential benefits of a short term opioids use as well as potential risks (i.e. addiction risk, apnea etc) and complications (i.e. Somnolence, constipation and others) were explained to the patient and were aknowledged.  RTC in 5 d To ER if worse

## 2023-06-21 NOTE — Assessment & Plan Note (Signed)
Discussed w pt

## 2023-06-21 NOTE — Addendum Note (Signed)
Addended by: Delsa Grana R on: 06/21/2023 04:55 PM   Modules accepted: Orders

## 2023-06-21 NOTE — Progress Notes (Signed)
Subjective:  Patient ID: Justin [name], male    DOB: [birth year]  Age: 77 y.o. MRN: [id number]  CC: Ear Pain (Knot in throat)   HPI BRANDAN VANNORMAN presents for severe R ear ache since Monday C/o ST, chills Pain is 7-8/10, 10/10 w/palpation, swollen gland on the left  Outpatient Medications Prior to Visit  Medication Sig Dispense Refill   ALPRAZolam (XANAX) 1 MG tablet TAKE 1/2 TO 1 TABLET BY MOUTH ONCE DAILY (Patient taking differently: Take 1 mg by mouth as needed for anxiety.) 30 tablet 2   aspirin EC 81 MG tablet Take 1 tablet (81 mg total) by mouth daily. Swallow whole. 30 tablet 0   clopidogrel (PLAVIX) 75 MG tablet Take 1 tablet (75 mg total) by mouth daily with breakfast. 90 tablet 1   diltiazem (CARDIZEM CD) 180 MG 24 hr capsule TAKE 1 CAPSULE BY MOUTH DAILY 30 capsule 11   isosorbide mononitrate (IMDUR) 30 MG 24 hr tablet TAKE 1 TABLET BY MOUTH DAILY 90 tablet 3   nitroGLYCERIN (NITROSTAT) 0.4 MG SL tablet Place 1 tablet (0.4 mg total) under the tongue every 5 (five) minutes as needed for chest pain. 25 tablet 3   pantoprazole (PROTONIX) 40 MG tablet Take 1 tablet (40 mg total) by mouth daily. 30 tablet 2   rivaroxaban (XARELTO) 20 MG TABS tablet Take 1 tablet (20 mg total) by mouth daily. 30 tablet 5   rosuvastatin (CRESTOR) 40 MG tablet Take 1 tablet (40 mg total) by mouth daily. 90 tablet 3   lamoTRIgine (LAMICTAL) 200 MG tablet Take 1 tablet (200 mg total) by mouth daily. 30 tablet 2   No facility-administered medications prior to visit.    ROS: Review of Systems  Constitutional:  Positive for chills and fatigue. Negative for appetite change, fever and unexpected weight change.  HENT:  Positive for sore throat and trouble swallowing. Negative for congestion, nosebleeds, sneezing and voice change.   Eyes:  Negative for itching and visual disturbance.  Respiratory:  Negative for cough.   Cardiovascular:  Negative for chest pain, palpitations and leg swelling.   Gastrointestinal:  Negative for abdominal distention, blood in stool, diarrhea and nausea.  Genitourinary:  Negative for frequency and hematuria.  Musculoskeletal:  Negative for back pain, gait problem, joint swelling and neck pain.  Skin:  Negative for rash.  Neurological:  Negative for dizziness, tremors, speech difficulty and weakness.  Psychiatric/Behavioral:  Negative for agitation, dysphoric mood and sleep disturbance. The patient is not nervous/anxious.     Objective:  BP 130/60 (BP Location: Left Arm, Patient Position: Sitting, Cuff Size: Normal)   Pulse 81   Temp 98.2 F (36.8 C) (Oral)   Ht 5\' 11"  (1.803 m)   Wt 166 lb (75.3 kg)   SpO2 99%   BMI 23.15 kg/m   BP Readings from Last 3 Encounters:  06/21/23 130/60  06/16/23 123/72  05/26/23 124/80    Wt Readings from Last 3 Encounters:  06/21/23 166 lb (75.3 kg)  06/15/23 165 lb (74.8 kg)  05/26/23 163 lb (73.9 kg)    Physical Exam Constitutional:      General: He is not in acute distress.    Appearance: He is well-developed.     Comments: NAD  Eyes:     Conjunctiva/sclera: Conjunctivae normal.     Pupils: Pupils are equal, round, and reactive to light.  Neck:     Thyroid: No thyromegaly.     Vascular: No JVD.     Comments:  mild Cardiovascular:     Rate and Rhythm: Normal rate and regular rhythm.     Heart sounds: Normal heart sounds. No murmur heard.    No friction rub. No gallop.  Pulmonary:     Effort: Pulmonary effort is normal. No respiratory distress.     Breath sounds: Normal breath sounds. No wheezing or rales.  Chest:     Chest wall: No tenderness.  Abdominal:     General: Bowel sounds are normal. There is no distension.     Palpations: Abdomen is soft. There is no mass.     Tenderness: There is no abdominal tenderness. There is no guarding or rebound.  Musculoskeletal:        General: Normal range of motion.     Cervical back: Normal range of motion. Tenderness present.  Lymphadenopathy:      Cervical: Cervical adenopathy present.  Skin:    General: Skin is warm and dry.     Findings: No rash.  Neurological:     Mental Status: He is alert and oriented to person, place, and time.     Cranial Nerves: No cranial nerve deficit.     Motor: No abnormal muscle tone.     Coordination: Coordination normal.     Gait: Gait normal.     Deep Tendon Reflexes: Reflexes are normal and symmetric.  Psychiatric:        Behavior: Behavior normal.        Thought Content: Thought content normal.        Judgment: Judgment normal.   Bad caries Swollen L submandibular and L sublingual - very painful Erythema and swelling under the tongue on the left No rash  Lab Results  Component Value Date   WBC 5.6 06/16/2023   HGB 12.8 (L) 06/16/2023   HCT 38.5 (L) 06/16/2023   PLT 194 06/16/2023   GLUCOSE 103 (H) 06/16/2023   CHOL 133 01/12/2023   TRIG 54.0 01/12/2023   HDL 73.50 01/12/2023   LDLCALC 49 01/12/2023   ALT 29 01/12/2023   AST 39 (H) 01/12/2023   NA 135 06/16/2023   K 3.8 06/16/2023   CL 108 06/16/2023   CREATININE 0.85 06/16/2023   BUN 10 06/16/2023   CO2 20 (L) 06/16/2023   TSH 0.34 (L) 01/12/2023   PSA 3.38 01/12/2023   INR 0.95 08/21/2018   HGBA1C 4.8 01/12/2023   MICROALBUR <0.7 01/12/2023    CARDIAC CATHETERIZATION  Result Date: 06/15/2023 1.  Severe mid LAD stenosis treated successfully with PCI using a 3.0 x 24 mm Synergy DES 2.  Severe mid circumflex stenosis treated successfully with a 3.5 x 12 mm Synergy DES 3.  Patent LAD and diagonal stents with mild in-stent restenosis 4.  Patent RCA with mild nonobstructive plaquing and moderate PDA stenosis (large dominant RCA) 5.  Normal LVEDP Recommendations: Recommend overnight observation as patient has nobody to stay with him.  Resume rivaroxaban tomorrow at home dose.  Continue clopidogrel x 6 months.  Continue aspirin x 4 weeks.    Assessment & Plan:   Problem List Items Addressed This Visit     Sialoadenitis of  submandibular gland - Primary    L side Swollen L submandibular and L sublingual - very painful Erythema and swelling under the tongue on the left Rocephin 500 mg IM Toradol 30 mg IM  Omnicef 300 mg bid Norco prn  Potential benefits of a short term opioids use as well as potential risks (i.e. addiction risk, apnea etc) and  complications (i.e. Somnolence, constipation and others) were explained to the patient and were aknowledged.  RTC in 5 d To ER if worse        Anticoagulated    Discussed w/pt         Meds ordered this encounter  Medications   HYDROcodone-acetaminophen (NORCO) 5-325 MG tablet    Sig: Take 1 tablet by mouth every 4 (four) hours as needed for moderate pain (pain score 4-6).    Dispense:  20 tablet    Refill:  0   cefdinir (OMNICEF) 300 MG capsule    Sig: Take 1 capsule (300 mg total) by mouth 2 (two) times daily.    Dispense:  20 capsule    Refill:  0      Follow-up: Return in about 5 days (around 06/26/2023) for f/u with PCP.  Sonda Primes, MD

## 2023-06-22 ENCOUNTER — Ambulatory Visit (HOSPITAL_COMMUNITY): Payer: Medicare Other | Admitting: Psychiatry

## 2023-06-22 ENCOUNTER — Telehealth: Payer: Self-pay | Admitting: Internal Medicine

## 2023-06-22 DIAGNOSIS — K1120 Sialoadenitis, unspecified: Secondary | ICD-10-CM

## 2023-06-22 NOTE — Telephone Encounter (Signed)
Patient saw Dr. Posey Rea yesterday about a referral for an ENT. He would like to know if that referral can still be ordered for him. Best callback is [phone].

## 2023-06-23 ENCOUNTER — Ambulatory Visit: Payer: Medicare Other | Attending: Internal Medicine | Admitting: Cardiovascular Disease

## 2023-06-23 ENCOUNTER — Telehealth: Payer: Self-pay | Admitting: Cardiology

## 2023-06-23 VITALS — BP 116/62 | HR 66 | Ht 71.0 in | Wt 168.6 lb

## 2023-06-23 DIAGNOSIS — S40021D Contusion of right upper arm, subsequent encounter: Secondary | ICD-10-CM

## 2023-06-23 DIAGNOSIS — S40021A Contusion of right upper arm, initial encounter: Secondary | ICD-10-CM

## 2023-06-23 NOTE — Telephone Encounter (Signed)
Patient returned RN's call. 

## 2023-06-23 NOTE — Telephone Encounter (Signed)
Consulted Dr. Excell Seltzer, who did his heart cath. He recommend patient to come in for nurse visit and we will look at his arm, to see if he needs an ultrasound. Patient agreed to plan.

## 2023-06-23 NOTE — Telephone Encounter (Signed)
Left message for patient to call back  

## 2023-06-23 NOTE — Progress Notes (Signed)
   Nurse Visit   Date of Encounter: 06/23/2023 ID: Justin [name], DOB Dec 01, 1945, MRN [id number]  PCP:  Corwin Levins, MD   [facility] Providers Cardiologist:  Tonny Bollman, MD Cardiology APP:  Beatrice Lecher, PA-C  Electrophysiologist:  Lanier Prude, MD      Visit Details   VS:  BP 116/62 (BP Location: Left Arm, Patient Position: Sitting, Cuff Size: Normal)   Pulse 66   Ht 5\' 11"  (1.803 m)   Wt 168 lb 9.6 oz (76.5 kg)   SpO2 98%   BMI 23.51 kg/m  , BMI Body mass index is 23.51 kg/m.  Wt Readings from Last 3 Encounters:  06/23/23 168 lb 9.6 oz (76.5 kg)  06/21/23 166 lb (75.3 kg)  06/15/23 165 lb (74.8 kg)     Reason for visit: Evaluate bruise on right arm Performed today: Vitals, EKG, Provider consulted:Dr. Excell Seltzer, and Education Changes (medications, testing, etc.) : Dr. Excell Seltzer ordered upper extremity artrial duplex to see if patient has a pseudoaneurysm as soon as possible.  Length of Visit: 20 minutes    Medications Adjustments/Labs and Tests Ordered: Upper Extremity Artrial Duplex    Signed, Ethelda Chick, RN  06/23/2023 2:51 PM  The patient is examined.  He has mild tenderness to the right wrist.  There is a small raised area over the right radial artery access site with a soft bruit present.  I have recommended a vascular ultrasound study to evaluate for pseudoaneurysm in this patient who had recent cardiac catheterization/PCI and is chronically anticoagulated.  He understands the limited use instructions with his right hand over the weekend and he is scheduled for an arterial Doppler study on Monday.  Tonny Bollman 06/23/2023 3:36 PM

## 2023-06-23 NOTE — Telephone Encounter (Signed)
Patient states post 10/24 cath he has been getting bruises on his arms since Monday, 10/28 and he would like to know if this is normal post-op. He states the bruising is becoming darker and he mentions one of them is dime-sized and raised near his forearm. Please advise.

## 2023-06-23 NOTE — Patient Instructions (Addendum)
Medication Instructions:  *If you need a refill on your cardiac medications before your next appointment, please call your pharmacy*  Lab Work: If you have labs (blood work) drawn today and your tests are completely normal, you will receive your results only by: MyChart Message (if you have MyChart) OR A paper copy in the mail If you have any lab test that is abnormal or we need to change your treatment, we will call you to review the results.  Testing/Procedures: Your physician has requested that you have a upper extremity arterial duplex. ASAP This test is an ultrasound of the arteries in the arms. It looks at arterial blood flow in the arms. Allow one hour Upper Arterial scans. There are no restrictions or special instructions.  Follow-Up: At [facility], you and your health needs are our priority.  As part of our continuing mission to provide you with exceptional heart care, we have created designated Provider Care Teams.  These Care Teams include your primary Cardiologist (physician) and Advanced Practice Providers (APPs -  Physician Assistants and Nurse Practitioners) who all work together to provide you with the care you need, when you need it.  We recommend signing up for the patient portal called "MyChart".  Sign up information is provided on this After Visit Summary.  MyChart is used to connect with patients for Virtual Visits (Telemedicine).  Patients are able to view lab/test results, encounter notes, upcoming appointments, etc.  Non-urgent messages can be sent to your provider as well.   To learn more about what you can do with MyChart, go to [email or web address].    Your next appointment:   Keep follow-up appointment next week   Provider:   Tonny Bollman, MD

## 2023-06-23 NOTE — Telephone Encounter (Signed)
Called patient back about his message. Patient stated he has bruising on wrist and forearm. Patient stated the bruise on his forearm is the size of a dime, it's raised, warm to touch, but no pain. Patient is taking Aspirin 81 mg, Xarelto 20 mg, and Plavix 75 mg. Will forward message to Dr. Excell Seltzer and his nurse for advisement.

## 2023-06-26 ENCOUNTER — Ambulatory Visit (HOSPITAL_COMMUNITY)
Admission: RE | Admit: 2023-06-26 | Discharge: 2023-06-26 | Disposition: A | Discharge status: Home / Self Care | Payer: Medicare Other | Source: Ambulatory Visit | Attending: Cardiology | Admitting: Cardiology

## 2023-06-26 DIAGNOSIS — S40021A Contusion of right upper arm, initial encounter: Secondary | ICD-10-CM | POA: Insufficient documentation

## 2023-06-26 DIAGNOSIS — S40021D Contusion of right upper arm, subsequent encounter: Secondary | ICD-10-CM | POA: Diagnosis present

## 2023-06-26 NOTE — Telephone Encounter (Signed)
Okay. Thank you.

## 2023-06-27 ENCOUNTER — Ambulatory Visit: Payer: Medicare Other | Admitting: Internal Medicine

## 2023-06-28 ENCOUNTER — Other Ambulatory Visit: Payer: Self-pay | Admitting: Internal Medicine

## 2023-06-29 ENCOUNTER — Ambulatory Visit (HOSPITAL_COMMUNITY): Payer: Medicare Other | Admitting: Licensed Clinical Social Worker

## 2023-06-29 DIAGNOSIS — F3162 Bipolar disorder, current episode mixed, moderate: Secondary | ICD-10-CM

## 2023-06-30 ENCOUNTER — Encounter: Payer: Self-pay | Admitting: Cardiovascular Disease

## 2023-06-30 ENCOUNTER — Ambulatory Visit: Payer: Medicare Other | Attending: Cardiovascular Disease | Admitting: Cardiovascular Disease

## 2023-06-30 VITALS — BP 120/64 | HR 73 | Ht 71.0 in | Wt 167.0 lb

## 2023-06-30 DIAGNOSIS — E782 Mixed hyperlipidemia: Secondary | ICD-10-CM

## 2023-06-30 DIAGNOSIS — I25119 Atherosclerotic heart disease of native coronary artery with unspecified angina pectoris: Secondary | ICD-10-CM | POA: Diagnosis not present

## 2023-06-30 DIAGNOSIS — I4819 Other persistent atrial fibrillation: Secondary | ICD-10-CM

## 2023-06-30 NOTE — Assessment & Plan Note (Signed)
Stable after recent PCI.  He continues on aspirin, clopidogrel, and rivaroxaban.  He will discontinue aspirin in 2 weeks and we discussed this recommendation today.  After 6 months, he can discontinue clopidogrel and resume aspirin 81 mg daily.  He is now chest pain-free on an antianginal program that includes diltiazem and isosorbide.  We reviewed his cardiac catheterization films today and demonstrated the LAD and circumflex lesions that were treated.  His right radial cath site has healed fully and I reviewed the vascular ultrasound study that showed no evidence of pseudoaneurysm.

## 2023-06-30 NOTE — Assessment & Plan Note (Signed)
Treated with rosuvastatin 40 mg daily.  LDL cholesterol is 49.

## 2023-06-30 NOTE — Assessment & Plan Note (Signed)
Followed by Dr. Lalla Brothers.  He has had redo A-fib ablation in June 2024.  Anticoagulated with rivaroxaban.

## 2023-06-30 NOTE — Patient Instructions (Addendum)
Medication Instructions:  MAY STOP Aspirin 07/16/23 *If you need a refill on your cardiac medications before your next appointment, please call your pharmacy*  Follow-Up: At [facility], you and your health needs are our priority.  As part of our continuing mission to provide you with exceptional heart care, we have created designated Provider Care Teams.  These Care Teams include your primary Cardiologist (physician) and Advanced Practice Providers (APPs -  Physician Assistants and Nurse Practitioners) who all work together to provide you with the care you need, when you need it.  Your next appointment:   6 month(s)  Provider:   Toney Rakes, or Louanne Skye

## 2023-06-30 NOTE — Progress Notes (Signed)
Cardiology Office Note:    Date:  06/30/2023   ID:  Justin [name], DOB 10-Apr-1946, MRN [id number]  PCP:  Corwin Levins, MD   [facility] Providers Cardiologist:  Tonny Bollman, MD Cardiology APP:  Beatrice Lecher, PA-C  Electrophysiologist:  Lanier Prude, MD     Referring MD: Corwin Levins, MD   Chief Complaint  Patient presents with   Coronary Artery Disease    History of Present Illness:    Justin [name] is a 77 y.o. male presenting for follow-up of coronary artery disease.  He was referred by Dr. Lalla Brothers for cardiac catheterization after complaining of chest pain at rest and with physical exertion.  He has a history of remote LAD stenting.  He has undergone redo PVI for treatment of atrial fibrillation in the past.  The patient underwent cardiac catheterization June 15, 2023 demonstrating severe stenosis in the mid LAD and mid circumflex, both lesions were treated with PCI and drug-eluting stent implantation.  After the procedure he later developed mild pain and bruising at the left radial cath site.  I brought him in and he was noted to have a small hematoma with a bruit over the access site.  Vascular ultrasound showed a small hematoma but no evidence of AV fistula or pseudoaneurysm.  Over the past week his arm is improved and he is having no pain or symptoms at present.  He denies any further chest pain.  He has no shortness of breath, edema, or recent heart palpitations.   Current Medications: Current Meds  Medication Sig   ALPRAZolam (XANAX) 1 MG tablet TAKE 1/2 TO 1 TABLET BY MOUTH ONCE DAILY   cefdinir (OMNICEF) 300 MG capsule Take 1 capsule (300 mg total) by mouth 2 (two) times daily.   clopidogrel (PLAVIX) 75 MG tablet Take 1 tablet (75 mg total) by mouth daily with breakfast.   diltiazem (CARDIZEM CD) 180 MG 24 hr capsule TAKE 1 CAPSULE BY MOUTH DAILY   isosorbide mononitrate (IMDUR) 30 MG 24 hr tablet TAKE 1 TABLET BY MOUTH DAILY   nitroGLYCERIN  (NITROSTAT) 0.4 MG SL tablet Place 1 tablet (0.4 mg total) under the tongue every 5 (five) minutes as needed for chest pain.   pantoprazole (PROTONIX) 40 MG tablet Take 1 tablet (40 mg total) by mouth daily.   rivaroxaban (XARELTO) 20 MG TABS tablet Take 1 tablet (20 mg total) by mouth daily.   rosuvastatin (CRESTOR) 40 MG tablet Take 1 tablet (40 mg total) by mouth daily.   [DISCONTINUED] aspirin EC 81 MG tablet Take 1 tablet (81 mg total) by mouth daily. Swallow whole.     Allergies:   Ambien [zolpidem tartrate], Codeine, Amiodarone, Ativan [lorazepam], Gabapentin, Lyrica [pregabalin], Amitriptyline, and Seroquel [quetiapine]   ROS:   Please see the history of present illness.    All other systems reviewed and are negative.  EKGs/Labs/Other Studies Reviewed:    The following studies were reviewed today: Cardiac Studies & Procedures   CARDIAC CATHETERIZATION  CARDIAC CATHETERIZATION 06/15/2023  Narrative 1.  Severe mid LAD stenosis treated successfully with PCI using a 3.0 x 24 mm Synergy DES 2.  Severe mid circumflex stenosis treated successfully with a 3.5 x 12 mm Synergy DES 3.  Patent LAD and diagonal stents with mild in-stent restenosis 4.  Patent RCA with mild nonobstructive plaquing and moderate PDA stenosis (large dominant RCA) 5.  Normal LVEDP  Recommendations: Recommend overnight observation as patient has nobody to stay with him.  Resume rivaroxaban tomorrow at home dose.  Continue clopidogrel x 6 months.  Continue aspirin x 4 weeks.  Findings Coronary Findings Diagnostic  Dominance: Right  Left Anterior Descending Prox LAD to Mid LAD lesion is 30% stenosed. The lesion was previously treated using a drug eluting stent over 2 years ago. Dist LAD lesion is 80% stenosed. The lesion is severely calcified.  First Diagonal Branch 1st Diag lesion is 30% stenosed. The lesion was previously treated using a drug eluting stent over 2 years ago.  Left Circumflex Prox Cx  lesion is 40% stenosed. Mid Cx lesion is 75% stenosed. The lesion is moderately calcified.  Right Coronary Artery Vessel is large. There is mild diffuse disease throughout the vessel. The RCA is a very large, dominant vessel.  There is mild plaquing and ectasia throughout.  The PDA has a 50% stenosis.  The PLA is large and has no significant stenosis.  Right Posterior Descending Artery RPDA lesion is 50% stenosed.  First Right Posterolateral Branch 1st RPL lesion is 30% stenosed.  Intervention  Dist LAD lesion Stent CATH VISTA GUIDE 6FR XBLAD4 guide catheter was inserted. Lesion crossed with guidewire using a WIRE ASAHI PROWATER 180CM. Pre-stent angioplasty was performed using a BALLN EMERGE MR 2.5X12. Maximum pressure:  12 atm. A drug-eluting stent was successfully placed. Post-stent angioplasty was performed using a BALL SAPPHIRE NC24 3.0X12. Maximum pressure:  16 atm. The predilatation balloon ruptured at 12 atm and there was a localized balloon dissection.  The lesion is stented with full coverage of the dissection and 0% residual stent stenosis after postdilatation.  There is TIMI-3 flow and no evidence of edge dissection. Post-Intervention Lesion Assessment The intervention was successful. Pre-interventional TIMI flow is 3. Post-intervention TIMI flow is 3. No complications occurred at this lesion. There is a 0% residual stenosis post intervention.  Mid Cx lesion Stent Pre-stent angioplasty was performed using a BALLN EMERGE MR 3.0X12. Maximum pressure:  10 atm. A drug-eluting stent was successfully placed using a SYNERGY XD 3.50X12. Maximum pressure: 14 atm. Post-stent angioplasty was not performed. Post-Intervention Lesion Assessment The intervention was successful. Pre-interventional TIMI flow is 3. Post-intervention TIMI flow is 3. No complications occurred at this lesion. There is a 0% residual stenosis post intervention.   STRESS TESTS  MYOCARDIAL PERFUSION IMAGING  02/02/2023  Narrative   ECG demonstrates incomplete right bundle branch block. The ECG shows premature ventricular contractions. There is a myocardial infarction located in the anteroseptal region.   No ST deviation was noted. Arrhythmias during stress: rare PVCs. Arrhythmias during recovery: rare PVCs. ECG was interpretable and without significant changes. The ECG was not diagnostic due to pharmacologic protocol.   LV perfusion is normal. There is no evidence of ischemia. There is no evidence of infarction.   Left ventricular function is normal. Nuclear stress EF: 57 %. End diastolic cavity size is normal. End systolic cavity size is normal. No evidence of transient ischemic dilation (TID) noted.   Prior study available for comparison from 03/11/2021.   The study is normal. The study is low risk.   ECHOCARDIOGRAM  ECHOCARDIOGRAM COMPLETE 11/17/2022  Narrative ECHOCARDIOGRAM REPORT    Patient Name:   DRAIDEN OLSHANSKY Haak Date of Exam: 11/17/2022 Medical Rec #:  [id number]      Height:       71.0 in Accession #:    [id number]     Weight:       170.4 lb Date of Birth:  Aug 18, 1946      BSA:  1.970 m Patient Age:    76 years       BP:           138/76 mmHg Patient Gender: M              HR:           78 bpm. Exam Location:  [address]  Procedure: 2D Echo, Cardiac Doppler, Color Doppler and Strain Analysis  Indications:    R06.02 SOB  History:        Patient has prior history of Echocardiogram examinations, most recent 03/10/2022. Previous Myocardial Infarction and CAD, Arrythmias:Atrial Fibrillation, Signs/Symptoms:Shortness of Breath; Risk Factors:Hypertension and Dyslipidemia.  Sonographer:    Samule Ohm RDCS Referring Phys: [id number] Rossie Muskrat LAMBERT  IMPRESSIONS   1. Left ventricular ejection fraction, by estimation, is 55 to 60%. The left ventricle has normal function. The left ventricle has no regional wall motion abnormalities. Left ventricular diastolic parameters are  consistent with Grade I diastolic dysfunction (impaired relaxation). 2. Right ventricular systolic function is normal. The right ventricular size is normal. 3. No evidence of mitral valve regurgitation. 4. The aortic valve is grossly normal. Aortic valve regurgitation is moderate. 5. Aneurysm of the aortic root, measuring 38 mm.  Conclusion(s)/Recommendation(s): MR has improved compared to prior study.  FINDINGS Left Ventricle: Left ventricular ejection fraction, by estimation, is 55 to 60%. The left ventricle has normal function. The left ventricle has no regional wall motion abnormalities. The left ventricular internal cavity size was normal in size. There is no left ventricular hypertrophy. Left ventricular diastolic parameters are consistent with Grade I diastolic dysfunction (impaired relaxation).  Right Ventricle: The right ventricular size is normal. Right ventricular systolic function is normal.  Left Atrium: Left atrial size was normal in size.  Right Atrium: Right atrial size was normal in size.  Pericardium: There is no evidence of pericardial effusion.  Mitral Valve: No evidence of mitral valve regurgitation.  Tricuspid Valve: Tricuspid valve regurgitation is mild.  Aortic Valve: The aortic valve is grossly normal. Aortic valve regurgitation is moderate. Aortic regurgitation PHT measures 667 msec.  Aorta: There is an aneurysm involving the aortic root measuring 38 mm.  IAS/Shunts: No atrial level shunt detected by color flow Doppler.   LEFT VENTRICLE PLAX 2D LVIDd:         4.50 cm   Diastology LVIDs:         2.70 cm   LV e' medial:    5.98 cm/s LV PW:         0.90 cm   LV E/e' medial:  9.8 LV IVS:        0.90 cm   LV e' lateral:   10.20 cm/s LVOT diam:     2.00 cm   LV E/e' lateral: 5.8 LV SV:         64 LV SV Index:   33        2D Longitudinal Strain LVOT Area:     3.14 cm  2D Strain GLS (A2C):   16.7 % 2D Strain GLS (A3C):   16.2 % 2D Strain GLS (A4C):   18.2  % 2D Strain GLS Avg:     17.0 %  RIGHT VENTRICLE             IVC RV S prime:     14.70 cm/s  IVC diam: 0.70 cm TAPSE (M-mode): 2.3 cm RVSP:           28.2 mmHg  LEFT ATRIUM  Index        RIGHT ATRIUM           Index LA diam:        4.10 cm 2.08 cm/m   RA Pressure: 3.00 mmHg LA Vol (A2C):   55.8 ml 28.33 ml/m  RA Area:     14.90 cm LA Vol (A4C):   55.0 ml 27.92 ml/m  RA Volume:   35.80 ml  18.17 ml/m LA Biplane Vol: 57.6 ml 29.24 ml/m AORTIC VALVE LVOT Vmax:   114.00 cm/s LVOT Vmean:  69.200 cm/s LVOT VTI:    0.205 m AI PHT:      667 msec  AORTA Ao Root diam: 3.80 cm Ao Asc diam:  3.70 cm  MITRAL VALVE               TRICUSPID VALVE MV Area (PHT): 2.56 cm    TR Peak grad:   25.2 mmHg MV Decel Time: 296 msec    TR Vmax:        251.00 cm/s MV E velocity: 58.70 cm/s  Estimated RAP:  3.00 mmHg MV A velocity: 68.70 cm/s  RVSP:           28.2 mmHg MV E/A ratio:  0.85 SHUNTS Systemic VTI:  0.20 m Systemic Diam: 2.00 cm  Carolan Clines Electronically signed by Carolan Clines Signature Date/Time: 11/17/2022/3:30:06 PM    Final    MONITORS  LONG TERM MONITOR (3-14 DAYS) 11/25/2022  Narrative HR 52 - 146 bpm, average 77 bpm. 1 nonsustained VT lasting 6 beats. 7 nonsustained SVT, longest 9 beats. 4% burden of AF, average rate 71 bpm. Symptom trigger episode corresponds to AF. Rare supraventricular and ventricular ectopy.  Sheria Lang T. Lalla Brothers, MD, [facility], [facility] Cardiac Electrophysiology           EKG:        Recent Labs: 01/12/2023: ALT 29; TSH 0.34 06/16/2023: BUN 10; Creatinine, Ser 0.85; Hemoglobin 12.8; Platelets 194; Potassium 3.8; Sodium 135  Recent Lipid Panel    Component Value Date/Time   CHOL 133 01/12/2023 1139   TRIG 54.0 01/12/2023 1139   HDL 73.50 01/12/2023 1139   CHOLHDL 2 01/12/2023 1139   VLDL 10.8 01/12/2023 1139   LDLCALC 49 01/12/2023 1139                Physical Exam:    VS:  BP 120/64   Pulse 73   Ht 5\' 11"  (1.803 m)    Wt 167 lb (75.8 kg)   SpO2 97%   BMI 23.29 kg/m     Wt Readings from Last 3 Encounters:  06/30/23 167 lb (75.8 kg)  06/23/23 168 lb 9.6 oz (76.5 kg)  06/21/23 166 lb (75.3 kg)     GEN:  Well nourished, well developed in no acute distress HEENT: Normal NECK: No JVD; No carotid bruits LYMPHATICS: No lymphadenopathy CARDIAC: RRR, no murmurs, rubs, gallops RESPIRATORY:  Clear to auscultation without rales, wheezing or rhonchi  ABDOMEN: Soft, non-tender, non-distended MUSCULOSKELETAL:  No edema; No deformity.  Right radial cath site is clear SKIN: Warm and dry NEUROLOGIC:  Alert and oriented x 3 PSYCHIATRIC:  Normal affect   Assessment & Plan Coronary artery disease involving native coronary artery of native heart with angina pectoris (HCC) Stable after recent PCI.  He continues on aspirin, clopidogrel, and rivaroxaban.  He will discontinue aspirin in 2 weeks and we discussed this recommendation today.  After 6 months, he can discontinue clopidogrel and resume aspirin 81 mg  daily.  He is now chest pain-free on an antianginal program that includes diltiazem and isosorbide.  We reviewed his cardiac catheterization films today and demonstrated the LAD and circumflex lesions that were treated.  His right radial cath site has healed fully and I reviewed the vascular ultrasound study that showed no evidence of pseudoaneurysm. Persistent atrial fibrillation (HCC) Followed by Dr. Lalla Brothers.  He has had redo A-fib ablation in June 2024.  Anticoagulated with rivaroxaban. Mixed hyperlipidemia Treated with rosuvastatin 40 mg daily.  LDL cholesterol is 49.            Medication Adjustments/Labs and Tests Ordered: Current medicines are reviewed at length with the patient today.  Concerns regarding medicines are outlined above.  No orders of the defined types were placed in this encounter.  No orders of the defined types were placed in this encounter.   Patient Instructions  Medication  Instructions:  MAY STOP Aspirin 07/16/23 *If you need a refill on your cardiac medications before your next appointment, please call your pharmacy*  Follow-Up: At [facility], you and your health needs are our priority.  As part of our continuing mission to provide you with exceptional heart care, we have created designated Provider Care Teams.  These Care Teams include your primary Cardiologist (physician) and Advanced Practice Providers (APPs -  Physician Assistants and Nurse Practitioners) who all work together to provide you with the care you need, when you need it.  Your next appointment:   6 month(s)  Provider:   Toney Rakes, or Sebastian Ache, MD  06/30/2023 4:57 PM    [facility]

## 2023-07-04 ENCOUNTER — Other Ambulatory Visit: Payer: Self-pay

## 2023-07-04 ENCOUNTER — Encounter (HOSPITAL_COMMUNITY): Payer: Self-pay | Admitting: Licensed Clinical Social Worker

## 2023-07-04 MED ORDER — DILTIAZEM HCL ER COATED BEADS 180 MG PO CP24: 180.0000 mg | ORAL_CAPSULE | Freq: Every day | ORAL | 11 refills | Status: DC

## 2023-07-04 NOTE — Progress Notes (Signed)
Virtual Visit via Video Note  I connected with Justin [name] on 06/29/23 at  1:30 PM EST by a video enabled telemedicine application and verified that I am speaking with the correct person using two identifiers.  Location: Patient: home Provider: home office   I discussed the limitations of evaluation and management by telemedicine and the availability of in person appointments. The patient expressed understanding and agreed to proceed.     I discussed the assessment and treatment plan with the patient. The patient was provided an opportunity to ask questions and all were answered. The patient agreed with the plan and demonstrated an understanding of the instructions.   The patient was advised to call back or seek an in-person evaluation if the symptoms worsen or if the condition fails to improve as anticipated.  I provided 45 minutes of non-face-to-face time during this encounter.   Justin Melter, LCSW   THERAPIST PROGRESS NOTE  Session Time: 1:30pm-2:15pm  Participation Level: Active  Behavioral Response: NeatAlertDepressed  Type of Therapy: Individual Therapy  Treatment Goals addressed: LTG: Reduce frequency, intensity, and duration of depression symptoms so that daily functioning is improved   ProgressTowards Goals: Progressing  Interventions: CBT  Summary: Justin [name] is a 77 y.o. male who presents with Bipolar I, mixed.   Suicidal/Homicidal: Nowithout intent/plan  Therapist Response: Justin [name] engaged well in virtual session with Facilities manager.  Clinician utilized CBT to process thoughts feelings and behaviors.  Clinician discussed relationships with family members, noting ongoing stress with son.  Justin [name] shared he is coming to terms with the fact that he and his son will not have an ideal relationship.  However he reports that he feels comfortable with how things are at this time.  Clinician explored physical health and mental health stability.  Justin [name] shared ongoing  concerns about physical health but noted that his mental health has been okay.  He continues to have some trouble with sleep.  Plan: Return again in 2 weeks.  Diagnosis: Bipolar 1 disorder, mixed, moderate (HCC)  Collaboration of Care: Patient refused AEB none required  Patient/Guardian was advised Release of Information must be obtained prior to any record release in order to collaborate their care with an outside provider. Patient/Guardian was advised if they have not already done so to contact the registration department to sign all necessary forms in order for [place] to release information regarding their care.   Consent: Patient/Guardian gives verbal consent for treatment and assignment of benefits for services provided during this visit. Patient/Guardian expressed understanding and agreed to proceed.   Justin Heck [place], LCSW 07/04/2023

## 2023-07-05 ENCOUNTER — Encounter: Payer: Self-pay | Admitting: Internal Medicine

## 2023-07-05 ENCOUNTER — Other Ambulatory Visit: Payer: Self-pay | Admitting: Internal Medicine

## 2023-07-05 ENCOUNTER — Ambulatory Visit: Payer: Medicare Other | Admitting: Internal Medicine

## 2023-07-05 ENCOUNTER — Ambulatory Visit (INDEPENDENT_AMBULATORY_CARE_PROVIDER_SITE_OTHER): Payer: Medicare Other

## 2023-07-05 VITALS — BP 122/60 | HR 82 | Temp 98.5°F | Ht 71.0 in | Wt 165.0 lb

## 2023-07-05 DIAGNOSIS — R739 Hyperglycemia, unspecified: Secondary | ICD-10-CM

## 2023-07-05 DIAGNOSIS — R1032 Left lower quadrant pain: Secondary | ICD-10-CM | POA: Diagnosis not present

## 2023-07-05 DIAGNOSIS — I10 Essential (primary) hypertension: Secondary | ICD-10-CM

## 2023-07-05 DIAGNOSIS — E559 Vitamin D deficiency, unspecified: Secondary | ICD-10-CM | POA: Diagnosis not present

## 2023-07-05 MED ORDER — HYDROCODONE-ACETAMINOPHEN 7.5-325 MG PO TABS: 1.0000 | ORAL_TABLET | Freq: Four times a day (QID) | ORAL | 0 refills | Status: DC | PRN

## 2023-07-05 MED ORDER — KETOROLAC TROMETHAMINE 30 MG/ML IJ SOLN
30.0000 mg | Freq: Once | INTRAMUSCULAR | Status: AC
  Administered 2023-07-05: 30 mg via INTRAMUSCULAR

## 2023-07-05 NOTE — Progress Notes (Unsigned)
Patient ID: Justin [name], male   DOB: [birth year], 77 y.o.   MRN: [id number]        Chief Complaint: follow up left groin pain and swelling       HPI:  Justin [name] is a 77 y.o. male here after cardiac ablation procedure oct 24 with right wrist access with some bruising now essentially resolved; now on plavix and xarelto with good compliance; now here 2 days after raking leaves in the yard for over an hour, now with onset severe pain and swelling to left groin, concerned about bleeding /hematoma.  No other trauma, fever, hernia or GU symptoms.  Pt denies chest pain, increased sob or doe, wheezing, orthopnea, PND, increased LE swelling, palpitations, dizziness or syncope.   Pt denies polydipsia, polyuria, or new focal neuro s/s.    Pt denies fever, wt loss, night sweats, loss of appetite, or other constitutional symptoms  .    Wt Readings from Last 3 Encounters:  07/06/23 167 lb (75.8 kg)  07/05/23 165 lb (74.8 kg)  06/30/23 167 lb (75.8 kg)   BP Readings from Last 3 Encounters:  07/06/23 120/70  07/05/23 122/60  06/30/23 120/64         Past Medical History:  Diagnosis Date   Anxiety and depression    Aortic insufficiency 03/11/2022   Echocardiogram 02/2022: EF 70-75, no RWMA, moderate LVH, normal RVSF, normal PASP (RVSP 20), moderate LAE, mild MR, moderate AI, AV sclerosis without stenosis, no aortic root or ascending aorta dilation   Arthritis    knees, c-spine // gets lumbar ESI q 6 mos   Bipolar disorder (HCC)    CAD (coronary artery disease)    [place] 08/2009 >> LHC ([facility]) - mLAD 70, Dx 65 >> PCI:  3x15 mm Endeavor DES to mLAD and 2.5x12 mm Endeavor DES to Dx // S/p NSTEMI >> LHC 8/12 ([facility]) - LM ok, LAD prox and mid 40; LAD stent ok, Dx stent ok, mRCA 30, mLCx 50 >> med Rx // ETT-Echo 2/17 ([facility]): Normal, EF 55-60 at rest   Chicken pox    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HCC)    Grief    History of echocardiogram    Echo 3/19: Mild concentric LVH, EF 60-65, normal wall  motion, grade 1 diastolic dysfunction, mild AI, mildly dilated aortic root (40 mm), MAC, mild LAE, atrial septal lipomatous hypertrophy // Echo 8/22: EF 55-60, no RWMA, mild LVH, GRII DD, normal RVSF, RVSP 40, mild LAE, trivial MR, mild-moderate AI, AV sclerosis without stenosis, mild dilation of aortic root (39 mm)   History of non-ST elevation myocardial infarction (NSTEMI)    History of nuclear stress test    Nuclear stress test 3/19: EF 57, inferior/inferoseptal/inferolateral defect consistent with probable soft tissue attenuation (cannot exclude subendocardial scar), no ischemia, intermediate risk >> Echo 3/19 normal EF, normal wall motion   History of pneumonia 2011   History of prostatitis 1990s   Hx of adenomatous colonic polyps 06/22/2016   Hyperlipidemia    Hypertension    Migraines    prior history   PTSD (post-traumatic stress disorder)    Past Surgical History:  Procedure Laterality Date   ATRIAL FIBRILLATION ABLATION N/A 05/17/2022   Procedure: ATRIAL FIBRILLATION ABLATION;  Surgeon: Lanier Prude, MD;  Location: [facility] INVASIVE CV LAB;  Service: Cardiovascular;  Laterality: N/A;   ATRIAL FIBRILLATION ABLATION N/A 02/14/2023   Procedure: ATRIAL FIBRILLATION ABLATION;  Surgeon: Lanier Prude, MD;  Location: [facility] INVASIVE CV LAB;  Service: Cardiovascular;  Laterality: N/A;   CARDIOVERSION N/A 08/05/2021   Procedure: CARDIOVERSION;  Surgeon: Elease Hashimoto Deloris Ping, MD;  Location: [facility] ENDOSCOPY;  Service: Cardiovascular;  Laterality: N/A;   CARDIOVERSION N/A 07/28/2022   Procedure: CARDIOVERSION;  Surgeon: Chrystie Nose, MD;  Location: [facility] ENDOSCOPY;  Service: Cardiovascular;  Laterality: N/A;   CARDIOVERSION N/A 09/23/2022   Procedure: CARDIOVERSION;  Surgeon: Parke Poisson, MD;  Location: [facility] ENDOSCOPY;  Service: Cardiovascular;  Laterality: N/A;   CERVICAL DISCECTOMY     COLONOSCOPY  03/2017   CORONARY STENT INTERVENTION N/A 06/15/2023   Procedure: CORONARY STENT INTERVENTION;   Surgeon: Tonny Bollman, MD;  Location: [facility] INVASIVE CV LAB;  Service: Cardiovascular;  Laterality: N/A;   ELBOW SURGERY Left    ELBOW SURGERY Right    INGUINAL HERNIA REPAIR Bilateral    1996, 1997   KNEE ARTHROSCOPY Right    KNEE CARTILAGE SURGERY Left    LEFT HEART CATH AND CORONARY ANGIOGRAPHY N/A 06/15/2023   Procedure: LEFT HEART CATH AND CORONARY ANGIOGRAPHY;  Surgeon: Tonny Bollman, MD;  Location: [facility] INVASIVE CV LAB;  Service: Cardiovascular;  Laterality: N/A;   NASAL SEPTUM SURGERY     STENT PLACEMENT VASCULAR ([facility] HX)  09/02/2010   TONSILLECTOMY     TOTAL KNEE ARTHROPLASTY Left 08/31/2018   Procedure: TOTAL KNEE ARTHROPLASTY;  Surgeon: Jodi Geralds, MD;  Location: [facility] ORS;  Service: Orthopedics;  Laterality: Left;    reports that he has never smoked. He has never used smokeless tobacco. He reports that he does not currently use alcohol after a past usage of about 6.0 standard drinks of alcohol per week. He reports that he does not use drugs. family history includes Alzheimer's disease in his mother; Arthritis in his father and mother; Depression in his brother; Diabetes in his sister and sister; Heart attack in his father, mother, and sister; Heart disease in his father and mother; Hyperlipidemia in his father; Hypertension in his father and mother; Lung cancer in his maternal aunt, paternal aunt, and paternal grandmother; Multiple sclerosis in his sister; Pulmonary embolism in his sister; Stroke in his father. Allergies  Allergen Reactions   Ambien [Zolpidem Tartrate] Other (See Comments)    Blackout, memory issues   Codeine     Causes Memory issues    Amiodarone     dizziness   Ativan [Lorazepam] Other (See Comments)    Made him feel completely out of it fell down   Gabapentin Other (See Comments)    Drowsy during the day   Lyrica [Pregabalin] Nausea Only   Amitriptyline Palpitations   Seroquel [Quetiapine]     SEVERE NIGHTMARES, SLEEPWALK AND NIGHT DRIVE WITH NO  RECOLLECTION UPON WAKENING   Current Outpatient Medications on File Prior to Visit  Medication Sig Dispense Refill   ALPRAZolam (XANAX) 1 MG tablet TAKE 1/2 TO 1 TABLET BY MOUTH ONCE DAILY 30 tablet 2   cefdinir (OMNICEF) 300 MG capsule Take 1 capsule (300 mg total) by mouth 2 (two) times daily. 20 capsule 0   clopidogrel (PLAVIX) 75 MG tablet Take 1 tablet (75 mg total) by mouth daily with breakfast. 90 tablet 1   diltiazem (CARDIZEM CD) 180 MG 24 hr capsule Take 1 capsule (180 mg total) by mouth daily. 30 capsule 11   isosorbide mononitrate (IMDUR) 30 MG 24 hr tablet TAKE 1 TABLET BY MOUTH DAILY 90 tablet 3   nitroGLYCERIN (NITROSTAT) 0.4 MG SL tablet Place 1 tablet (0.4 mg total) under the tongue every 5 (five) minutes  as needed for chest pain. 25 tablet 3   pantoprazole (PROTONIX) 40 MG tablet Take 1 tablet (40 mg total) by mouth daily. 30 tablet 2   rivaroxaban (XARELTO) 20 MG TABS tablet Take 1 tablet (20 mg total) by mouth daily. 30 tablet 5   rosuvastatin (CRESTOR) 40 MG tablet Take 1 tablet (40 mg total) by mouth daily. 90 tablet 3   lamoTRIgine (LAMICTAL) 200 MG tablet Take 1 tablet (200 mg total) by mouth daily. 30 tablet 2   No current facility-administered medications on file prior to visit.        ROS:  All others reviewed and negative.  Objective        PE:  BP 122/60 (BP Location: Right Arm, Patient Position: Sitting, Cuff Size: Normal)   Pulse 82   Temp 98.5 F (36.9 C) (Oral)   Ht 5\' 11"  (1.803 m)   Wt 165 lb (74.8 kg)   SpO2 99%   BMI 23.01 kg/m                 Constitutional: Pt appears in NAD               HENT: Head: NCAT.                Right Ear: External ear normal.                 Left Ear: External ear normal.                Eyes: . Pupils are equal, round, and reactive to light. Conjunctivae and EOM are normal               Nose: without d/c or deformity               Neck: Neck supple. Gross normal ROM               Cardiovascular: Normal rate and  regular rhythm.                 Pulmonary/Chest: Effort normal and breath sounds without rales or wheezing.                Abd:  Soft, NT, ND, + BS, no organomegaly               Neurological: Pt is alert. At baseline orientation, motor grossly intact               Skin: Skin is warm. No rashes, no other new lesions, LE edema - none;  left groin and proximal medial upper leg with large severe tender roughly 4 x 6 cm swelling ; with LLE o/w neurovasc intact, and skin without change               Psychiatric: Pt behavior is normal without agitation   Micro: none  Cardiac tracings I have personally interpreted today:  none  Pertinent Radiological findings (summarize): none   Lab Results  Component Value Date   WBC 5.4 07/06/2023   HGB 11.8 (L) 07/06/2023   HCT 34.4 (L) 07/06/2023   PLT 278.0 07/06/2023   GLUCOSE 109 (H) 07/06/2023   CHOL 133 01/12/2023   TRIG 54.0 01/12/2023   HDL 73.50 01/12/2023   LDLCALC 49 01/12/2023   ALT 14 07/06/2023   AST 22 07/06/2023   NA 129 (L) 07/06/2023   K 4.8 07/06/2023   CL 96 07/06/2023   CREATININE 0.87 07/06/2023   BUN 15 07/06/2023  CO2 29 07/06/2023   TSH 0.34 (L) 01/12/2023   PSA 3.38 01/12/2023   INR 2.5 (H) 07/06/2023   HGBA1C 4.8 01/12/2023   MICROALBUR <0.7 01/12/2023   Assessment/Plan:  Justin [name] is a 77 y.o. White or Caucasian [1] male with  has a past medical history of Anxiety and depression, Aortic insufficiency (03/11/2022), Arthritis, Bipolar disorder (HCC), CAD (coronary artery disease), Chicken pox, 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HCC), Grief, History of echocardiogram, History of non-ST elevation myocardial infarction (NSTEMI), History of nuclear stress test, History of pneumonia (2011), History of prostatitis (1990s), adenomatous colonic polyps (06/22/2016), Hyperlipidemia, Hypertension, Migraines, and PTSD (post-traumatic stress disorder).  Severe left groin pain Pt on plavix xarelto now with large left severe tender left groin  upper medial leg swelling and tender, I suspect msk tearing with hematoma; now for inr, cbc with labs, left hip xray,  also toradol 30 mg IM, hydrocodone 7.5 /325 prn, and refer sport med asap as ultrasound likely to be helpful in diagnosis, and continue plavix xarelto for now  Hypertension BP Readings from Last 3 Encounters:  07/06/23 120/70  07/05/23 122/60  06/30/23 120/64   Stable, pt to continue medical treatment card cd 180 every day.    Hyperglycemia Lab Results  Component Value Date   HGBA1C 4.8 01/12/2023   Stable, pt to continue current medical treatment  - diet,w t control   Vitamin D deficiency Last vitamin D Lab Results  Component Value Date   VD25OH 30.47 01/12/2023   Low, to start oral replacement  Followup: Return if symptoms worsen or fail to improve.  Oliver Barre, MD 07/06/2023 8:34 PM [facility]

## 2023-07-05 NOTE — Patient Instructions (Signed)
You had the pain shot today (toradol)  Please take all new medication as prescribed  - the hydrocodone to go home with  Please continue all other medications as before, and refills have been done if requested.  Please have the pharmacy call with any other refills you may need.  Please continue your efforts at being more active, low cholesterol diet, and weight control.  You are otherwise up to date with prevention measures today.  Please keep your appointments with your specialists as you may have planned  You will be contacted regarding the referral for: Sports Medicine (urgent)  Please go to the XRAY Department in the first floor for the x-ray testing  Please go to the LAB at the blood drawing area for the tests to be done  You will be contacted by phone if any changes need to be made immediately.  Otherwise, you will receive a letter about your results with an explanation, but please check with MyChart first.

## 2023-07-06 ENCOUNTER — Encounter: Payer: Self-pay | Admitting: Internal Medicine

## 2023-07-06 ENCOUNTER — Encounter: Payer: Self-pay | Admitting: Family Medicine

## 2023-07-06 ENCOUNTER — Ambulatory Visit: Payer: Medicare Other | Admitting: Family Medicine

## 2023-07-06 ENCOUNTER — Ambulatory Visit (HOSPITAL_COMMUNITY)
Admission: RE | Admit: 2023-07-06 | Discharge: 2023-07-06 | Disposition: A | Discharge status: Home / Self Care | Payer: Medicare Other | Source: Ambulatory Visit | Attending: Family Medicine | Admitting: Family Medicine

## 2023-07-06 ENCOUNTER — Ambulatory Visit: Payer: Self-pay

## 2023-07-06 VITALS — BP 120/70 | HR 70 | Ht 71.0 in | Wt 167.0 lb

## 2023-07-06 DIAGNOSIS — M7989 Other specified soft tissue disorders: Secondary | ICD-10-CM

## 2023-07-06 DIAGNOSIS — R1032 Left lower quadrant pain: Secondary | ICD-10-CM

## 2023-07-06 LAB — BASIC METABOLIC PANEL
BUN: 15 mg/dL (ref 6–23)
CO2: 29 meq/L (ref 19–32)
Calcium: 9.6 mg/dL (ref 8.4–10.5)
Chloride: 96 meq/L (ref 96–112)
Creatinine, Ser: 0.87 mg/dL (ref 0.40–1.50)
GFR: 83.45 mL/min (ref >60.00)
Glucose, Bld: 109 mg/dL — ABNORMAL HIGH (ref 70–99)
Potassium: 4.8 meq/L (ref 3.5–5.1)
Sodium: 129 meq/L — ABNORMAL LOW (ref 135–145)

## 2023-07-06 LAB — CBC WITH DIFFERENTIAL/PLATELET
Basophils Absolute: 0 10*3/uL (ref 0.0–0.1)
Basophils Relative: 0.6 % (ref 0.0–3.0)
Eosinophils Absolute: 0.1 10*3/uL (ref 0.0–0.7)
Eosinophils Relative: 1.9 % (ref 0.0–5.0)
HCT: 34.4 % — ABNORMAL LOW (ref 39.0–52.0)
Hemoglobin: 11.8 g/dL — ABNORMAL LOW (ref 13.0–17.0)
Lymphocytes Relative: 14.4 % (ref 12.0–46.0)
Lymphs Abs: 0.8 10*3/uL (ref 0.7–4.0)
MCHC: 34.4 g/dL (ref 30.0–36.0)
MCV: 95.1 fL (ref 78.0–100.0)
Monocytes Absolute: 0.6 10*3/uL (ref 0.1–1.0)
Monocytes Relative: 11.2 % (ref 3.0–12.0)
Neutro Abs: 3.9 10*3/uL (ref 1.4–7.7)
Neutrophils Relative %: 71.9 % (ref 43.0–77.0)
Platelets: 278 10*3/uL (ref 150.0–400.0)
RBC: 3.61 Mil/uL — ABNORMAL LOW (ref 4.22–5.81)
RDW: 12.5 % (ref 11.5–15.5)
WBC: 5.4 10*3/uL (ref 4.0–10.5)

## 2023-07-06 LAB — HEPATIC FUNCTION PANEL
ALT: 14 U/L (ref 0–53)
AST: 22 U/L (ref 0–37)
Albumin: 4.2 g/dL (ref 3.5–5.2)
Alkaline Phosphatase: 59 U/L (ref 39–117)
Bilirubin, Direct: 0.2 mg/dL (ref 0.0–0.3)
Total Bilirubin: 1.1 mg/dL (ref 0.2–1.2)
Total Protein: 7 g/dL (ref 6.0–8.3)

## 2023-07-06 LAB — PROTIME-INR
INR: 2.5 {ratio} — ABNORMAL HIGH (ref 0.8–1.0)
Prothrombin Time: 25.5 s — ABNORMAL HIGH (ref 9.6–13.1)

## 2023-07-06 NOTE — Patient Instructions (Signed)
Thank you for coming in today.  Ace Wrap placed today.   We've placed an order for an ultrasound of your leg.

## 2023-07-06 NOTE — Progress Notes (Signed)
I, Stevenson Clinch, CMA acting as a scribe for Clementeen Graham, MD.  Justin [name] is a 77 y.o. male who presents to [facility] at [facility] today for L groin pain. Pt was previously seen by Dr. Denyse Amass on 10/24/22 for bilat thumb pain.  Today, pt c/o L groin pain x 5 days. Pt locates pain to the left groin. Sx started after blowing leaves. Responded well to IM Toradol for about 12 hours, then sx started to return. Swelling present. Notes having some urinary retention, dysuria, and incomplete void.   Radiates: some pain radiating into the leg Aggravates: position change, prolonged ambulation Treatments tried: toradol IM injection, ice  Dx imaging: 07/05/23 L hip XR  Pertinent review of systems: No fevers or chills  Relevant historical information: Coronary artery disease.   Exam:  BP 120/70   Pulse 70   Ht 5\' 11"  (1.803 m)   Wt 167 lb (75.8 kg)   SpO2 98%   BMI 23.29 kg/m  General: Well Developed, well nourished, and in no acute distress.   MSK: Left hip and groin: Some swelling present at the medial thigh.  Mildly tender palpation of this region. Normal hip motion. Some pain with standing and hip adduction and flexion is present.  Genitals are normal-appearing.  Testicles are nontender with no masses.  No hernia palpated.    Lab and Radiology Results  Diagnostic Limited MSK Ultrasound of: Left medial thigh Hypoechoic fluid collecting within subcutaneous tissue consistent in appearance with edema along the medial thigh.  No definitive muscle tear or hematoma is visible.  The vascular structures in the anterior medial thigh such as the femoral artery and vein are generally normal-appearing Impression: Swelling medial thigh looks to be edematous.  No results found for this or any previous visit (from the past 72 hour(s)). DG HIPS BILAT WITH PELVIS MIN 5 VIEWS  Result Date: 07/05/2023 CLINICAL DATA:  Left hip pain for several days without known injury. EXAM:  DG HIP (WITH OR WITHOUT PELVIS) 5+V BILAT COMPARISON:  June 09, 2017. FINDINGS: There is no evidence of hip fracture or dislocation. There is no evidence of arthropathy or other focal bone abnormality. IMPRESSION: Negative. Electronically Signed   By: Lupita Raider M.D.   On: 07/05/2023 15:36   I, Clementeen Graham, personally (independently) visualized and performed the interpretation of the images attached in this note.     Assessment and Plan: 77 y.o. male with left hip and groin pain.  Etiology is unclear.  He does have some visible swelling on exam and on ultrasound.  This is sometimes typical for a muscle strain or partial tear.  DVT is a possibility but is unlikely.  Plan for compression using a compression sleeve and Ace wrap.  Additionally will arrange for a vascular ultrasound to test for DVT.  Will recheck in 1 week.   PDMP not reviewed this encounter. Orders Placed This Encounter  Procedures   [place] LIMITED JOINT SPACE STRUCTURES LOW LEFT(NO LINKED CHARGES)    Order Specific Question:   Reason for Exam (SYMPTOM  OR DIAGNOSIS REQUIRED)    Answer:   left groin pain    Order Specific Question:   Preferred imaging location?    Answer:    [facility]   No orders of the defined types were placed in this encounter.    Discussed warning signs or symptoms. Please see discharge instructions. Patient expresses understanding.   The above documentation has been reviewed and is  accurate and complete Clementeen Graham, M.D.

## 2023-07-06 NOTE — Assessment & Plan Note (Signed)
Lab Results  Component Value Date   HGBA1C 4.8 01/12/2023   Stable, pt to continue current medical treatment - diet, wt control

## 2023-07-06 NOTE — Assessment & Plan Note (Signed)
BP Readings from Last 3 Encounters:  07/06/23 120/70  07/05/23 122/60  06/30/23 120/64   Stable, pt to continue medical treatment card cd 180 every day.

## 2023-07-06 NOTE — Assessment & Plan Note (Signed)
Last vitamin D Lab Results  Component Value Date   VD25OH 30.47 01/12/2023   Low, to start oral replacement

## 2023-07-06 NOTE — Assessment & Plan Note (Addendum)
Pt on plavix xarelto now with large left severe tender left groin upper medial leg swelling and tender, I suspect msk tearing with hematoma; now for inr, cbc with labs, left hip xray,  also toradol 30 mg IM, hydrocodone 7.5 /325 prn, and refer sport med asap as ultrasound likely to be helpful in diagnosis, and continue plavix xarelto for now

## 2023-07-06 NOTE — Progress Notes (Signed)
VASCULAR LAB    Left lower extremity venous duplex has been performed.  See CV proc for preliminary results.  Called negative report to Dr. Tessie Fass, [facility], RVT 07/06/2023, 4:26 PM

## 2023-07-10 NOTE — Progress Notes (Signed)
No evidence of DVT on the vascular study.

## 2023-07-13 ENCOUNTER — Ambulatory Visit: Payer: Medicare Other | Admitting: Family Medicine

## 2023-07-13 ENCOUNTER — Ambulatory Visit (HOSPITAL_COMMUNITY): Payer: Medicare Other | Admitting: Licensed Clinical Social Worker

## 2023-07-13 ENCOUNTER — Other Ambulatory Visit: Payer: Self-pay

## 2023-07-13 VITALS — BP 142/74 | HR 75 | Ht 71.0 in | Wt 167.0 lb

## 2023-07-13 DIAGNOSIS — M7989 Other specified soft tissue disorders: Secondary | ICD-10-CM | POA: Diagnosis not present

## 2023-07-13 DIAGNOSIS — R1032 Left lower quadrant pain: Secondary | ICD-10-CM

## 2023-07-13 MED ORDER — HYDROCODONE-ACETAMINOPHEN 7.5-325 MG PO TABS: 1.0000 | ORAL_TABLET | Freq: Four times a day (QID) | ORAL | 0 refills | Status: DC | PRN

## 2023-07-13 MED ORDER — AMBULATORY NON FORMULARY MEDICATION: 0 refills | Status: DC

## 2023-07-13 MED ORDER — CEFDINIR 300 MG PO CAPS: 300.0000 mg | ORAL_CAPSULE | Freq: Two times a day (BID) | ORAL | 0 refills | Status: AC

## 2023-07-13 NOTE — Patient Instructions (Signed)
Thank you for coming in today.   Try adding the compression stocking.   Use the hydrocodone for pain.   Take the omnicef antibiotics.   Recheck in 2 weeks.   If you get worse go the ER.

## 2023-07-13 NOTE — Progress Notes (Signed)
Rubin Payor, PhD, LAT, ATC acting as a scribe for Clementeen Graham, MD.  Justin [name] is a 77 y.o. male who presents to [facility] at [facility] today for f/u L groin pain. Pt was last seen by Dr. Denyse Amass on 07/06/23 and a groin wrap was applied and vasc US ordered.  Today, pt reports L groin pain worsened on Sunday. He has 3 "lumps" on his L leg; the area on the distal portion of the posterior thigh is most painful. When standing the pain will radiate into the L calf. Pain is waking him up during the night.   Dx testing: 07/06/23 LE vasc US 07/05/23 L hip XR   Pertinent review of systems: No fevers or chills  Relevant historical information: Patient takes Plavix and Xarelto.   Exam:  BP (!) 142/74   Pulse 75   Ht 5\' 11"  (1.803 m)   Wt 167 lb (75.8 kg)   SpO2 99%   BMI 23.29 kg/m  General: Well Developed, well nourished, and in no acute distress.   MSK: Left leg: Swelling present at the posterior to medial thigh and into the posterior medial calf.  There is some bruising in this area.  No induration or erythema is present.  This whole area is tender to palpation.  Antalgic gait is present.    Lab and Radiology Results  Diagnostic Limited MSK Ultrasound of: Left thigh area of pain Hypoechoic fluid tracks within subcutaneous tissue along the medial and posterior thigh and calf consistent with edema.  No hyperechoic air is present within the soft tissue. Impression: Edema     Assessment and Plan: 77 y.o. male with left medial thigh and now calf pain and swelling occurring after a twisting injury.  I think he likely had a small muscle tear in his proximal thigh musculature about 2 weeks ago and because he is so anticoagulated with Plavix and Xarelto he developed a somewhat large hematoma that is now draining down into the thigh and calf causing pain.  Cellulitis is very unlikely but is a minor possibility.  Scribe hydrocodone for pain control.  Limited course  of Omnicef for the possibility of cellulitis.  Recheck in 2 weeks.  Most important treatment should be thigh-high compression stockings.   PDMP reviewed during this encounter. Orders Placed This Encounter  Procedures   [place] LIMITED JOINT SPACE STRUCTURES LOW LEFT(NO LINKED CHARGES)    Standing Status:   Future    Number of Occurrences:   1    Standing Expiration Date:   07/12/2024    Order Specific Question:   Reason for Exam (SYMPTOM  OR DIAGNOSIS REQUIRED)    Answer:   Left leg/groin pain    Order Specific Question:   Preferred imaging location?    Answer:   Adult nurse [facility] ordered this encounter  Medications   AMBULATORY NON FORMULARY MEDICATION    Sig: Thigh compression stocking  Disp 1    Dispense:  1 each    Refill:  0   HYDROcodone-acetaminophen (NORCO) 7.5-325 MG tablet    Sig: Take 1 tablet by mouth every 6 (six) hours as needed.    Dispense:  30 tablet    Refill:  0   cefdinir (OMNICEF) 300 MG capsule    Sig: Take 1 capsule (300 mg total) by mouth 2 (two) times daily for 7 days.    Dispense:  14 capsule    Refill:  0  Discussed warning signs or symptoms. Please see discharge instructions. Patient expresses understanding.   The above documentation has been reviewed and is accurate and complete Clementeen Graham, M.D.

## 2023-07-14 ENCOUNTER — Emergency Department (HOSPITAL_BASED_OUTPATIENT_CLINIC_OR_DEPARTMENT_OTHER): Payer: Medicare Other

## 2023-07-14 ENCOUNTER — Other Ambulatory Visit: Payer: Self-pay

## 2023-07-14 ENCOUNTER — Emergency Department (HOSPITAL_BASED_OUTPATIENT_CLINIC_OR_DEPARTMENT_OTHER)
Admission: EM | Admit: 2023-07-14 | Discharge: 2023-07-14 | Disposition: A | Discharge status: Home / Self Care | Payer: Medicare Other | Attending: Emergency Medicine | Admitting: Emergency Medicine

## 2023-07-14 ENCOUNTER — Encounter (HOSPITAL_BASED_OUTPATIENT_CLINIC_OR_DEPARTMENT_OTHER): Payer: Self-pay | Admitting: Emergency Medicine

## 2023-07-14 DIAGNOSIS — M79605 Pain in left leg: Secondary | ICD-10-CM | POA: Insufficient documentation

## 2023-07-14 LAB — CBC WITH DIFFERENTIAL/PLATELET
Abs Immature Granulocytes: 0.01 10*3/uL (ref 0.00–0.07)
Basophils Absolute: 0 10*3/uL (ref 0.0–0.1)
Basophils Relative: 1 %
Eosinophils Absolute: 0.1 10*3/uL (ref 0.0–0.5)
Eosinophils Relative: 2 %
HCT: 33.6 % — ABNORMAL LOW (ref 39.0–52.0)
Hemoglobin: 10.9 g/dL — ABNORMAL LOW (ref 13.0–17.0)
Immature Granulocytes: 0 %
Lymphocytes Relative: 13 %
Lymphs Abs: 0.8 10*3/uL (ref 0.7–4.0)
MCH: 31.4 pg (ref 26.0–34.0)
MCHC: 32.4 g/dL (ref 30.0–36.0)
MCV: 96.8 fL (ref 80.0–100.0)
Monocytes Absolute: 0.5 10*3/uL (ref 0.1–1.0)
Monocytes Relative: 7 %
Neutro Abs: 4.8 10*3/uL (ref 1.7–7.7)
Neutrophils Relative %: 77 %
Platelets: 355 10*3/uL (ref 150–400)
RBC: 3.47 MIL/uL — ABNORMAL LOW (ref 4.22–5.81)
RDW: 12 % (ref 11.5–15.5)
WBC: 6.1 10*3/uL (ref 4.0–10.5)
nRBC: 0 % (ref 0.0–0.2)

## 2023-07-14 LAB — BASIC METABOLIC PANEL
Anion gap: 9 (ref 5–15)
BUN: 16 mg/dL (ref 8–23)
CO2: 24 mmol/L (ref 22–32)
Calcium: 9.4 mg/dL (ref 8.9–10.3)
Chloride: 99 mmol/L (ref 98–111)
Creatinine, Ser: 0.79 mg/dL (ref 0.61–1.24)
GFR, Estimated: >60 mL/min (ref >60)
Glucose, Bld: 108 mg/dL — ABNORMAL HIGH (ref 70–99)
Potassium: 4.1 mmol/L (ref 3.5–5.1)
Sodium: 132 mmol/L — ABNORMAL LOW (ref 135–145)

## 2023-07-14 MED ORDER — OXYCODONE HCL 5 MG PO TABS: 5.0000 mg | ORAL_TABLET | Freq: Four times a day (QID) | ORAL | 0 refills | Status: DC | PRN

## 2023-07-14 MED ORDER — IOHEXOL 350 MG/ML SOLN
100.0000 mL | Freq: Once | INTRAVENOUS | Status: AC | PRN
  Administered 2023-07-14: 100 mL via INTRAVENOUS

## 2023-07-14 MED ORDER — OXYCODONE HCL 5 MG PO TABS
5.0000 mg | ORAL_TABLET | Freq: Once | ORAL | Status: AC
  Administered 2023-07-14: 5 mg via ORAL
  Filled 2023-07-14: qty 1

## 2023-07-14 NOTE — ED Triage Notes (Signed)
Pt continues to have pain to posterior LLE; he has been evaluated by PCP and sports medicine; had [place] yesterday (neg for clots); they think he has a muscle tear; here because pain is getting worse

## 2023-07-14 NOTE — ED Provider Notes (Signed)
EMERGENCY DEPARTMENT AT [facility] Provider Note   CSN: [id number] Arrival date & time: 07/14/23  1459     History Chief Complaint  Patient presents with   Leg Pain    HPI  - Chief Complaint: Pain in the lower eyelid and leg issues. - History of Present Illness: Initially experienced pain in the lower leg, thought to be a hematoma. Later developed progressive pain and swelling in the leg. Previously diagnosed with a muscle tear and some arthritis in the leg. Underwent heart catheterization on October 24th. Placed on Xarelto (blood thinner) following the catheterization. Symptoms worsened, leading to a follow-up visit.  Advised to go to ER if condition deteriorated. This morning, experienced significant worsening of pain and swelling in the leg PCP told to return. - Past Medical History: Heart catheterization on October 24th. 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 Minor tear in leg (previously diagnosed). - Past Surgical History: No further pertinent information beyond existing medical record. - Pertinent Review of Systems: No further pertinent information beyond existing medical record.   Patient's recorded medical, surgical, social, medication list and allergies were reviewed in the Snapshot window as part of the initial history.   Review of Systems   Review of Systems  Constitutional:  Negative for chills and fever.  HENT:  Negative for ear pain and sore throat.   Eyes:  Negative for pain and visual disturbance.  Respiratory:  Negative for cough and shortness of breath.   Cardiovascular:  Positive for leg swelling. Negative for chest pain and palpitations.  Gastrointestinal:  Negative for abdominal pain and vomiting.  Genitourinary:  Negative for dysuria and hematuria.  Musculoskeletal:  Positive for myalgias. Negative for arthralgias and back pain.  Skin:  Negative for color change and rash.  Neurological:  Negative for seizures and syncope.  All other systems reviewed and  are negative.   Physical Exam Updated Vital Signs BP (!) 148/78   Pulse 71   Temp 98 F (36.7 C)   Resp 16   SpO2 100%  Physical Exam Vitals and nursing note reviewed.  Constitutional:      Appearance: He is well-developed.  HENT:     Head: Normocephalic and atraumatic.     Nose: No congestion or rhinorrhea.     Mouth/Throat:     Mouth: Mucous membranes are moist.     Pharynx: Oropharynx is clear. No oropharyngeal exudate.  Eyes:     Conjunctiva/sclera: Conjunctivae normal.     Pupils: Pupils are equal, round, and reactive to light.  Cardiovascular:     Rate and Rhythm: Normal rate and regular rhythm.     Heart sounds: No murmur heard. Pulmonary:     Effort: Pulmonary effort is normal. No respiratory distress.     Breath sounds: Normal breath sounds.  Abdominal:     Palpations: Abdomen is soft.     Tenderness: There is no abdominal tenderness.  Musculoskeletal:        General: Signs of injury present. No swelling, tenderness or deformity. Normal range of motion.     Cervical back: Neck supple. No rigidity or tenderness.  Skin:    General: Skin is warm and dry.  Neurological:     General: No focal deficit present.     Mental Status: He is alert and oriented to person, place, and time. Mental status is at baseline.     Cranial Nerves: No cranial nerve deficit.     Motor: No weakness.      ED Course/  Medical Decision Making/ A&P    Procedures Procedures   Medications Ordered in ED Medications  iohexol (OMNIPAQUE) 350 MG/ML injection 100 mL (100 mLs Intravenous Contrast Given 07/14/23 1918)  oxyCODONE (Oxy IR/ROXICODONE) immediate release tablet 5 mg (5 mg Oral Given 07/14/23 2154)    Medical Decision Making:   - Assessment: Presents with pain and swelling in the leg, with a history of minor tear and arthritis, and recent heart catheterization. On anticoagulation therapy. - Differential Diagnosis: Hematoma, muscle strain with fluid collection, complication  from anticoagulation therapy. - Most Likely Diagnosis: Muscle strain with fluid collection. - Plan: POC Ultrasound to confirm diagnosis. Advised against NSAIDs due to anticoagulation therapy. Continue hydrocodone for pain management. Monitor symptoms and follow up as necessary.  Reassessment and Plan:   Will perform point-of-care ultrasound.  As drastically surprised with the degree of injury.  He has a bruising firm appearance from his left gluteus all the way down to his left ankle.  He cannot think of any traumatic etiology of this.  Given his history of recent cardiac catheterization I am concerned about potential arterial dissection running down the extremity given the rapid progression of the insult over the past 48 hours.  Added on blood work, plan for angiography and runoff of the lower extremity.   Disposition:  I have considered need for hospitalization, however, considering all of the above, I believe this patient is stable for discharge at this time.  Patient/family educated about specific return precautions for given chief complaint and symptoms.  Patient/family educated about follow-up with PCP   Patient/family expressed understanding of return precautions and need for follow-up. Patient spoken to regarding all imaging and laboratory results and appropriate follow up for these results. All education provided in verbal form with additional information in written form. Time was allowed for answering of patient questions. Patient discharged.    Emergency Department Medication Summary:   Medications  iohexol (OMNIPAQUE) 350 MG/ML injection 100 mL (100 mLs Intravenous Contrast Given 07/14/23 1918)  oxyCODONE (Oxy IR/ROXICODONE) immediate release tablet 5 mg (5 mg Oral Given 07/14/23 2154)         Clinical Impression:  1. Left leg pain      Discharge   Final Clinical Impression(s) / ED Diagnoses Final diagnoses:  Left leg pain    Rx / DC Orders ED Discharge Orders           Ordered    oxyCODONE (ROXICODONE) 5 MG immediate release tablet  Every 6 hours PRN        07/14/23 2140              Glyn Ade, MD 07/14/23 2300

## 2023-07-14 NOTE — Discharge Instructions (Addendum)
You have a hematoma in your left posterior compartment of your leg. Does not have any active bleeding on your CT scan tonight. It does not appear infected based on your blood work and CT scan. Hematomas are prone to infection so if you have rapidly progressing symptoms you need to proceed to a tertiary care center where he can be evaluated by general surgeon to ensure this is not necrotizing fasciitis. Right now this does not appear to be the case.  Please follow-up very closely with your primary care provider within 48 hours for reassessment.

## 2023-07-19 ENCOUNTER — Encounter (HOSPITAL_COMMUNITY): Payer: Self-pay | Admitting: Licensed Clinical Social Worker

## 2023-07-19 ENCOUNTER — Ambulatory Visit (HOSPITAL_COMMUNITY): Payer: Medicare Other | Admitting: Licensed Clinical Social Worker

## 2023-07-19 DIAGNOSIS — F3162 Bipolar disorder, current episode mixed, moderate: Secondary | ICD-10-CM

## 2023-07-19 NOTE — Progress Notes (Signed)
Virtual Visit via Video Note  I connected with Justin [name] on 07/19/23 at 11:00 AM EST by a video enabled telemedicine application and verified that I am speaking with the correct person using two identifiers.  Location: Patient: home Provider: home office   I discussed the limitations of evaluation and management by telemedicine and the availability of in person appointments. The patient expressed understanding and agreed to proceed.   I discussed the assessment and treatment plan with the patient. The patient was provided an opportunity to ask questions and all were answered. The patient agreed with the plan and demonstrated an understanding of the instructions.   The patient was advised to call back or seek an in-person evaluation if the symptoms worsen or if the condition fails to improve as anticipated.  I provided 45 minutes of non-face-to-face time during this encounter.   Justin Melter, LCSW   THERAPIST PROGRESS NOTE  Session Time: 11:00am-11:45am  Participation Level: Active  Behavioral Response: CasualAlertEuthymic  Type of Therapy: Individual Therapy  Treatment Goals addressed:  LTG: Reduce frequency, intensity, and duration of depression symptoms so that daily functioning is improved   ProgressTowards Goals: Progressing  Interventions: CBT  Summary: Justin [name] is a 77 y.o. male who presents with Bipolar I, mixed, moderate.   Suicidal/Homicidal: Nowithout intent/plan  Therapist Response: Justin [name] engaged well in individual virtual session with Facilities manager.  Clinician utilized CBT to process thoughts feelings and behaviors.  Clinician explored recent health concerns and injury to leg.  Justin [name] shared a past 2 weeks events regarding his leg injury and doctor's appointments.  Clinician explored how injury has impacted his mood.  Justin [name] shared that his mood has been stable but he feels frustrated that he is not able to do the active work that he needs to do around the  house.  Clinician discussed plans for Thanksgiving and identified the importance of this holiday to Justin [name].  Clinician explored whether spending this holiday alone would make him feel more or less depressed.  Justin [name] shared that he has been finding comfort in happy memories without feeling depressed.  Plan: Return again in 2 weeks.  Diagnosis: Bipolar 1 disorder, mixed, moderate (HCC)  Collaboration of Care: Medication Management AEB Justin [name] has an appointment with Dr. Lolly [name] on Friday of this week  Patient/Guardian was advised Release of Information must be obtained prior to any record release in order to collaborate their care with an outside provider. Patient/Guardian was advised if they have not already done so to contact the registration department to sign all necessary forms in order for [place] to release information regarding their care.   Consent: Patient/Guardian gives verbal consent for treatment and assignment of benefits for services provided during this visit. Patient/Guardian expressed understanding and agreed to proceed.   Justin Heck Virgin, LCSW 07/19/2023

## 2023-07-21 ENCOUNTER — Encounter (HOSPITAL_COMMUNITY): Payer: Self-pay | Admitting: Psychiatry

## 2023-07-21 ENCOUNTER — Telehealth (HOSPITAL_BASED_OUTPATIENT_CLINIC_OR_DEPARTMENT_OTHER): Payer: Medicare Other | Admitting: Psychiatry

## 2023-07-21 VITALS — Wt 167.0 lb

## 2023-07-21 DIAGNOSIS — F3162 Bipolar disorder, current episode mixed, moderate: Secondary | ICD-10-CM

## 2023-07-21 DIAGNOSIS — F4310 Post-traumatic stress disorder, unspecified: Secondary | ICD-10-CM | POA: Diagnosis not present

## 2023-07-21 MED ORDER — LAMOTRIGINE 200 MG PO TABS: 200.0000 mg | ORAL_TABLET | Freq: Every day | ORAL | 2 refills | Status: DC

## 2023-07-21 NOTE — Progress Notes (Signed)
[facility] MD Virtual Progress Note   Patient Location: Home Provider Location: Home Office  I connect with patient by telephone and verified that I am speaking with correct person by using two identifiers. I discussed the limitations of evaluation and management by telemedicine and the availability of in person appointments. I also discussed with the patient that there may be a patient responsible charge related to this service. The patient expressed understanding and agreed to proceed.  Justin [name] [id number] 77 y.o.  07/21/2023 9:29 AM  History of Present Illness:  Patient is evaluated by phone session.  He reported mentally he is stable but physically not doing well.  He had a catheterization and required stent in his heart but weeks later he had severe leg pain and he was taken to the emergency room and found to have big hematoma.  He has difficulty walking.  He was again seen in the emergency room in [facility] but did not feel it helped as much.  He was told to rest and hematoma will resolve on its own.  He admitted some frustration but realized that he needed to wait until things get better.  He is not able to drive.  He is not able to extensive walk.  He was offered to have a Thanksgiving from his son but he could not go because of the leg pain but he was very pleased that send brought food to him.  His sister also called and promised to have a food on Saturday.  Overall he feels things are going okay.  He is sleeping better.  He denies any mania, anger, hallucination, paranoia or any suicidal thoughts.  He is in therapy with Shanda Bumps.  He has no tremor or shakes or any EPS.  He is compliant with Lamictal.  He has no rash or any itching.  He is more concerned about his physical health and he has appointment coming up with his cardiology, neurology and primary care.  His nightmares and flashbacks are not as intense.  Past Psychiatric History: H/O suicidal attempt by  overdose on Ambien twice, hydroxyzine and alcohol. H/O bipolar disorder with multiple inpatient.  Did IOP. Last ER visit Sept 2020 and inpatient October 2018 at [facility]. Tried Zyprexa, Cymbalta, Seroquel, Vistaril, Neurontin, Wellbutrin and triptal. Tried low-dose doxepin and trazodone. Had a reaction with Ativan and pain medication.  Saw Dr. Emerson Monte in past.    Outpatient Encounter Medications as of 07/21/2023  Medication Sig   ALPRAZolam (XANAX) 1 MG tablet TAKE 1/2 TO 1 TABLET BY MOUTH ONCE DAILY   AMBULATORY NON FORMULARY MEDICATION Thigh compression stocking  Disp 1   clopidogrel (PLAVIX) 75 MG tablet Take 1 tablet (75 mg total) by mouth daily with breakfast.   diltiazem (CARDIZEM CD) 180 MG 24 hr capsule Take 1 capsule (180 mg total) by mouth daily.   HYDROcodone-acetaminophen (NORCO) 7.5-325 MG tablet Take 1 tablet by mouth every 6 (six) hours as needed.   isosorbide mononitrate (IMDUR) 30 MG 24 hr tablet TAKE 1 TABLET BY MOUTH DAILY   lamoTRIgine (LAMICTAL) 200 MG tablet Take 1 tablet (200 mg total) by mouth daily.   nitroGLYCERIN (NITROSTAT) 0.4 MG SL tablet Place 1 tablet (0.4 mg total) under the tongue every 5 (five) minutes as needed for chest pain.   oxyCODONE (ROXICODONE) 5 MG immediate release tablet Take 1 tablet (5 mg total) by mouth every 6 (six) hours as needed for severe pain (pain score 7-10).   pantoprazole (PROTONIX) 40  MG tablet Take 1 tablet (40 mg total) by mouth daily.   rivaroxaban (XARELTO) 20 MG TABS tablet Take 1 tablet (20 mg total) by mouth daily.   rosuvastatin (CRESTOR) 40 MG tablet Take 1 tablet (40 mg total) by mouth daily.   No facility-administered encounter medications on file as of 07/21/2023.    Recent Results (from the past 2160 hour(s))  Basic metabolic panel     Status: Abnormal   Collection Time: 05/26/23 12:02 PM  Result Value Ref Range   Glucose 105 (H) 70 - 99 mg/dL   BUN 13 8 - 27 mg/dL   Creatinine, Ser 5.36 0.76 - 1.27 mg/dL   eGFR  90 >64 QI/HKV/4.25   BUN/Creatinine Ratio 16 10 - 24   Sodium 138 134 - 144 mmol/L   Potassium 4.8 3.5 - 5.2 mmol/L   Chloride 102 96 - 106 mmol/L   CO2 25 20 - 29 mmol/L   Calcium 9.3 8.6 - 10.2 mg/dL  CBC     Status: Abnormal   Collection Time: 05/26/23 12:02 PM  Result Value Ref Range   WBC 4.6 3.4 - 10.8 x10E3/uL   RBC 4.09 (L) 4.14 - 5.80 x10E6/uL   Hemoglobin 13.0 13.0 - 17.7 g/dL   Hematocrit 95.6 38.7 - 51.0 %   MCV 100 (H) 79 - 97 fL   MCH 31.8 26.6 - 33.0 pg   MCHC 31.9 31.5 - 35.7 g/dL   RDW 56.4 33.2 - 95.1 %   Platelets 224 150 - 450 x10E3/uL  POCT Activated clotting time     Status: None   Collection Time: 06/15/23 11:20 AM  Result Value Ref Range   Activated Clotting Time 244 seconds    Comment: Reference range 74-137 seconds for patients not on anticoagulant therapy.  POCT Activated clotting time     Status: None   Collection Time: 06/15/23 11:31 AM  Result Value Ref Range   Activated Clotting Time 562 seconds    Comment: Reference range 74-137 seconds for patients not on anticoagulant therapy.  POCT Activated clotting time     Status: None   Collection Time: 06/15/23 11:47 AM  Result Value Ref Range   Activated Clotting Time 250 seconds    Comment: Reference range 74-137 seconds for patients not on anticoagulant therapy.  Glucose, capillary     Status: None   Collection Time: 06/15/23  1:31 PM  Result Value Ref Range   Glucose-Capillary 88 70 - 99 mg/dL    Comment: Glucose reference range applies only to samples taken after fasting for at least 8 hours.  Glucose, capillary     Status: Abnormal   Collection Time: 06/15/23  4:18 PM  Result Value Ref Range   Glucose-Capillary 137 (H) 70 - 99 mg/dL    Comment: Glucose reference range applies only to samples taken after fasting for at least 8 hours.  Glucose, capillary     Status: None   Collection Time: 06/15/23  9:14 PM  Result Value Ref Range   Glucose-Capillary 93 70 - 99 mg/dL    Comment: Glucose  reference range applies only to samples taken after fasting for at least 8 hours.  Basic metabolic panel     Status: Abnormal   Collection Time: 06/16/23  6:48 AM  Result Value Ref Range   Sodium 135 135 - 145 mmol/L   Potassium 3.8 3.5 - 5.1 mmol/L   Chloride 108 98 - 111 mmol/L   CO2 20 (L) 22 - 32 mmol/L  Glucose, Bld 103 (H) 70 - 99 mg/dL    Comment: Glucose reference range applies only to samples taken after fasting for at least 8 hours.   BUN 10 8 - 23 mg/dL   Creatinine, Ser 4.09 0.61 - 1.24 mg/dL   Calcium 8.6 (L) 8.9 - 10.3 mg/dL   GFR, Estimated >81 >19 mL/min    Comment: (NOTE) Calculated using the CKD-EPI Creatinine Equation (2021)    Anion gap 7 5 - 15    Comment: Performed at [facility], [address], [place], [place] [address]  CBC     Status: Abnormal   Collection Time: 06/16/23  6:48 AM  Result Value Ref Range   WBC 5.6 4.0 - 10.5 K/uL   RBC 4.04 (L) 4.22 - 5.81 MIL/uL   Hemoglobin 12.8 (L) 13.0 - 17.0 g/dL   HCT 95.6 (L) 21.3 - 08.6 %   MCV 95.3 80.0 - 100.0 fL   MCH 31.7 26.0 - 34.0 pg   MCHC 33.2 30.0 - 36.0 g/dL   RDW 57.8 46.9 - 62.9 %   Platelets 194 150 - 400 K/uL   nRBC 0.0 0.0 - 0.2 %    Comment: Performed at [facility], [address] [place], [place] [address]  Lipoprotein A (LPA)     Status: Abnormal   Collection Time: 06/16/23  6:48 AM  Result Value Ref Range   Lipoprotein (a) 153.9 (H) <75.0 nmol/L    Comment: (NOTE) Note:  Values greater than or equal to 75.0 nmol/L may       indicate an independent risk factor for CHD,       but must be evaluated with caution when applied       to non-Caucasian populations due to the       influence of genetic factors on Lp(a) across       ethnicities. Performed At: [facility] [address] [place], [place] [id number] Jolene Schimke MD OZ:[id number]   Glucose, capillary     Status: Abnormal   Collection Time: 06/16/23  8:18 AM  Result Value Ref Range   Glucose-Capillary  105 (H) 70 - 99 mg/dL    Comment: Glucose reference range applies only to samples taken after fasting for at least 8 hours.  Protime-INR     Status: Abnormal   Collection Time: 07/06/23  1:19 PM  Result Value Ref Range   INR 2.5 (H) 0.8 - 1.0 ratio   Prothrombin Time 25.5 (H) 9.6 - 13.1 sec  Basic metabolic panel     Status: Abnormal   Collection Time: 07/06/23  1:19 PM  Result Value Ref Range   Sodium 129 (L) 135 - 145 mEq/L   Potassium 4.8 3.5 - 5.1 mEq/L   Chloride 96 96 - 112 mEq/L   CO2 29 19 - 32 mEq/L   Glucose, Bld 109 (H) 70 - 99 mg/dL   BUN 15 6 - 23 mg/dL   Creatinine, Ser 2.59 0.40 - 1.50 mg/dL   GFR 56.38 >75.64 mL/min    Comment: Calculated using the CKD-EPI Creatinine Equation (2021)   Calcium 9.6 8.4 - 10.5 mg/dL  CBC with Differential/Platelet     Status: Abnormal   Collection Time: 07/06/23  1:19 PM  Result Value Ref Range   WBC 5.4 4.0 - 10.5 K/uL   RBC 3.61 (L) 4.22 - 5.81 Mil/uL   Hemoglobin 11.8 (L) 13.0 - 17.0 g/dL   HCT 33.2 (L) 95.1 - 88.4 %  MCV 95.1 78.0 - 100.0 fl   MCHC 34.4 30.0 - 36.0 g/dL   RDW 34.7 42.5 - 95.6 %   Platelets 278.0 150.0 - 400.0 K/uL   Neutrophils Relative % 71.9 43.0 - 77.0 %   Lymphocytes Relative 14.4 12.0 - 46.0 %   Monocytes Relative 11.2 3.0 - 12.0 %   Eosinophils Relative 1.9 0.0 - 5.0 %   Basophils Relative 0.6 0.0 - 3.0 %   Neutro Abs 3.9 1.4 - 7.7 K/uL   Lymphs Abs 0.8 0.7 - 4.0 K/uL   Monocytes Absolute 0.6 0.1 - 1.0 K/uL   Eosinophils Absolute 0.1 0.0 - 0.7 K/uL   Basophils Absolute 0.0 0.0 - 0.1 K/uL  Hepatic function panel     Status: None   Collection Time: 07/06/23  1:19 PM  Result Value Ref Range   Total Bilirubin 1.1 0.2 - 1.2 mg/dL   Bilirubin, Direct 0.2 0.0 - 0.3 mg/dL   Alkaline Phosphatase 59 39 - 117 U/L   AST 22 0 - 37 U/L   ALT 14 0 - 53 U/L   Total Protein 7.0 6.0 - 8.3 g/dL   Albumin 4.2 3.5 - 5.2 g/dL  CBC with Differential     Status: Abnormal   Collection Time: 07/14/23  6:02 PM   Result Value Ref Range   WBC 6.1 4.0 - 10.5 K/uL   RBC 3.47 (L) 4.22 - 5.81 MIL/uL   Hemoglobin 10.9 (L) 13.0 - 17.0 g/dL   HCT 38.7 (L) 56.4 - 33.2 %   MCV 96.8 80.0 - 100.0 fL   MCH 31.4 26.0 - 34.0 pg   MCHC 32.4 30.0 - 36.0 g/dL   RDW 95.1 88.4 - 16.6 %   Platelets 355 150 - 400 K/uL   nRBC 0.0 0.0 - 0.2 %   Neutrophils Relative % 77 %   Neutro Abs 4.8 1.7 - 7.7 K/uL   Lymphocytes Relative 13 %   Lymphs Abs 0.8 0.7 - 4.0 K/uL   Monocytes Relative 7 %   Monocytes Absolute 0.5 0.1 - 1.0 K/uL   Eosinophils Relative 2 %   Eosinophils Absolute 0.1 0.0 - 0.5 K/uL   Basophils Relative 1 %   Basophils Absolute 0.0 0.0 - 0.1 K/uL   Immature Granulocytes 0 %   Abs Immature Granulocytes 0.01 0.00 - 0.07 K/uL    Comment: Performed at [facility], [address] [place], [place] [address]  Basic metabolic panel     Status: Abnormal   Collection Time: 07/14/23  6:02 PM  Result Value Ref Range   Sodium 132 (L) 135 - 145 mmol/L   Potassium 4.1 3.5 - 5.1 mmol/L   Chloride 99 98 - 111 mmol/L   CO2 24 22 - 32 mmol/L   Glucose, Bld 108 (H) 70 - 99 mg/dL    Comment: Glucose reference range applies only to samples taken after fasting for at least 8 hours.   BUN 16 8 - 23 mg/dL   Creatinine, Ser 6.01 0.61 - 1.24 mg/dL   Calcium 9.4 8.9 - 09.3 mg/dL   GFR, Estimated >23 >55 mL/min    Comment: (NOTE) Calculated using the CKD-EPI Creatinine Equation (2021)    Anion gap 9 5 - 15    Comment: Performed at [facility], [address] [place], [place] [address]     Psychiatric Specialty Exam: Physical Exam  Review of Systems  Musculoskeletal:        Leg  pain  Neurological:  Positive for numbness.    Weight 167 lb (75.8 kg).There is no height or weight on file to calculate BMI.  General Appearance: NA  Eye Contact:  NA  Speech:  Slow  Volume:  Decreased  Mood:  Anxious  Affect:  NA  Thought Process:  Goal Directed  Orientation:  Full (Time, Place, and  Person)  Thought Content:  Rumination  Suicidal Thoughts:  No  Homicidal Thoughts:  No  Memory:  Immediate;   Good Recent;   Good Remote;   Fair  Judgement:  Intact  Insight:  Present  Psychomotor Activity:  NA  Concentration:  Concentration: Fair and Attention Span: Fair  Recall:  Good  Fund of Knowledge:  Good  Language:  Good  Akathisia:  No  Handed:  Right  AIMS (if indicated):     Assets:  Communication Skills Desire for Improvement Housing Social Support  ADL's:  Intact  Cognition:  WNL  Sleep:  fair     Assessment/Plan: Bipolar 1 disorder, mixed, moderate (HCC) - Plan: lamoTRIgine (LAMICTAL) 200 MG tablet  PTSD (post-traumatic stress disorder) - Plan: lamoTRIgine (LAMICTAL) 200 MG tablet  Discussed physical health and recent leg pain secondary to hematoma.  He reported his mood symptoms are stable.  He does not want to change the medication.  Continue Lamictal 200 mg daily.  Recommend to keep the appointment with Shanda Bumps.  Patient has no rash or any itching.  He is going to see his neurology for neuropathy and cardiology for status post cardiac catheterization and stent placed.  Recommend to call us back if he has any question or any concern.  Follow-up in 3 months   Follow Up Instructions:     I discussed the assessment and treatment plan with the patient. The patient was provided an opportunity to ask questions and all were answered. The patient agreed with the plan and demonstrated an understanding of the instructions.   The patient was advised to call back or seek an in-person evaluation if the symptoms worsen or if the condition fails to improve as anticipated.    Collaboration of Care: Other provider involved in patient's care AEB notes are available in epic to review  Patient/Guardian was advised Release of Information must be obtained prior to any record release in order to collaborate their care with an outside provider. Patient/Guardian was advised if  they have not already done so to contact the registration department to sign all necessary forms in order for [place] to release information regarding their care.   Consent: Patient/Guardian gives verbal consent for treatment and assignment of benefits for services provided during this visit. Patient/Guardian expressed understanding and agreed to proceed.     I provided 20 minutes of non face to face time during this encounter.  Note: This document was prepared by [employer] voice dictation technology and any errors that results from this process are unintentional.    Cleotis Nipper, MD 07/21/2023

## 2023-07-27 ENCOUNTER — Ambulatory Visit: Payer: Medicare Other | Admitting: Family Medicine

## 2023-07-27 VITALS — BP 138/78 | HR 79 | Ht 71.0 in | Wt 163.0 lb

## 2023-07-27 DIAGNOSIS — M7989 Other specified soft tissue disorders: Secondary | ICD-10-CM

## 2023-07-27 DIAGNOSIS — K639 Disease of intestine, unspecified: Secondary | ICD-10-CM

## 2023-07-27 MED ORDER — HYDROCODONE-ACETAMINOPHEN 7.5-325 MG PO TABS: 1.0000 | ORAL_TABLET | Freq: Every day | ORAL | 0 refills | Status: DC | PRN

## 2023-07-27 NOTE — Progress Notes (Signed)
Rubin Payor, PhD, LAT, ATC acting as a scribe for Clementeen Graham, MD.  Justin [name] is a 77 y.o. male who presents to [facility] at [facility] today for f/u L leg pain. Pt was last seen by Dr. Denyse Amass on 07/13/23 and was prescribed hydrocodone and Omnicef and advised to cont compression. Pain worsened the following day and was seen at the [facility] ED.  Today, pt reports he's been wearing the compression. 50% improvement in pain. Increased pain w/ prolong standing and walking. He continues to have pain L calf, posterior L knee, and L groin.   Dx testing: 07/14/23 CT angio 07/06/23 LE vasc US 07/05/23 L hip XR  Pertinent review of systems: No fevers or chills  Relevant historical information: History of colon polyps on colonoscopy 2018 per patient   Exam:  BP 138/78   Pulse 79   Ht 5\' 11"  (1.803 m)   Wt 163 lb (73.9 kg)   SpO2 99%   BMI 22.73 kg/m  General: Well Developed, well nourished, and in no acute distress.   MSK: Left leg normal gait    Lab and Radiology Results  EXAM: CT ANGIOGRAPHY OF ABDOMINAL AORTA WITH ILIOFEMORAL RUNOFF   TECHNIQUE: Multidetector CT imaging of the abdomen, pelvis and lower extremities was performed using the standard protocol during bolus administration of intravenous contrast. Multiplanar CT image reconstructions and MIPs were obtained to evaluate the vascular anatomy.   RADIATION DOSE REDUCTION: This exam was performed according to the departmental dose-optimization program which includes automated exposure control, adjustment of the mA and/or kV according to patient size and/or use of iterative reconstruction technique.   CONTRAST:  OMNIPAQUE IOHEXOL 350 MG/ML SOLN   COMPARISON:  CTA 05/18/2022   FINDINGS: VASCULAR   Aorta: Scattered calcified atherosclerotic plaque. No significant stenosis, aneurysm, or dissection.   Celiac: Patent without aneurysm, dissection or significant stenosis.   SMA:  Mixed density plaque at the origin extending into the proximal SMA causes moderate narrowing. No aneurysm or dissection.   Renals: Patent single left renal artery without aneurysm or dissection. Dual right renal arteries with severe narrowing at their origins secondary to calcified atherosclerotic plaque.   IMA: Patent.   RIGHT Lower Extremity   Inflow: Scattered calcified atherosclerotic plaque without aneurysm or dissection. No significant stenosis.   Outflow: Scattered calcified atherosclerotic plaque without significant stenosis, aneurysm, or dissection.   Runoff: Patent three vessel runoff to the ankle.   LEFT Lower Extremity   Inflow: Scattered calcified atherosclerotic plaque without significant stenosis, aneurysm, or dissection.   Outflow: Scattered calcified atherosclerotic plaque without significant stenosis, aneurysm, or dissection. Portions of the popliteal artery are obscured by streak artifact   Runoff: Patent three vessel runoff to the ankle.   Veins: No obvious venous abnormality within the limitations of this arterial phase study.   Review of the MIP images confirms the above findings.   NON-VASCULAR   Lower chest: No acute abnormality.   Hepatobiliary: No focal liver abnormality is seen. Unremarkable gallbladder. Stable dilation of the common bile duct measuring 14 mm.   Pancreas: Unremarkable.   Spleen: Unremarkable.   Adrenals/Urinary Tract: Unremarkable adrenal glands. No urinary calculi or hydronephrosis. Nondistended bladder.   Stomach/Bowel: Focal wall thickening in the proximal transverse colon (circa series 609/image 127 measuring approximately 5 cm in length. Normal caliber large and small bowel. Colonic diverticulosis without diverticulitis. Moderate colonic stool burden. Normal appendix. Stomach is within normal limits.   Lymphatic: No lymphadenopathy.  Reproductive: Enlarged prostate.   Other: No free intraperitoneal fluid or  air.   Musculoskeletal: Left TKA. Postoperative changes right calcaneus. No acute fracture.   Soft tissue thickening and subcutaneous fat stranding in the posteromedial proximal left thigh extending inferiorly along the left leg into the calf. Edema is present within the popliteal fossa between the semimembranosus and short head of the biceps femoris muscle bellies (circa series 302/image 286). The attachment sites of these muscles are obscured by streak artifact.   IMPRESSION: 1. No acute vascular abnormality. 2. Soft tissue thickening and subcutaneous fat stranding in the posteromedial proximal left thigh extending inferiorly along the left leg into the calf. This is nonspecific but in the setting of trauma may represent contusion. 3. Edema is present within the popliteal fossa between the semimembranosus and short head of the biceps femoris muscle bellies. The attachment sites of these muscles are obscured by streak artifact. If there is concern for muscular tear, consider MRI for further evaluation. 4. Focal wall thickening in the proximal transverse colon measuring approximately 5 cm in length. Recommend colonoscopy to exclude neoplasm. 5. Stable dilation of the common bile duct measuring 14 mm. Correlate with LFTs.   Aortic Atherosclerosis (ICD10-I70.0).     Electronically Signed   By: Minerva Fester M.D.   On: 07/14/2023 21:04 I, Clementeen Graham, personally (independently) visualized and performed the interpretation of the images attached in this note.      Assessment and Plan: 77 y.o. male with left leg swelling and bruising following a muscle tear event.  Since his last visit he visited the emergency room where he had an extensive CT angiogram scan of his abdomen, pelvis and legs. He is improving but still having some symptoms in his left leg.  At the long compression stocking has been helpful. During that scan the colon wall of the transverse colon was seen to be  thickened incidentally.  He is overdue for colonoscopy given that he had polyps in the past.  Plan for gastroenterology referral today to evaluate and schedule a colonoscopy.  As for his leg pain plan to continue compression stockings and reassess in a month.  If not improved consider MRI of the left thigh/femur.   PDMP reviewed during this encounter. Orders Placed This Encounter  Procedures   Ambulatory referral to Gastroenterology    Referral Priority:   Routine    Referral Type:   Consultation    Referral Reason:   Specialty Services Required    Number of Visits Requested:   1   Meds ordered this encounter  Medications   HYDROcodone-acetaminophen (NORCO) 7.5-325 MG tablet    Sig: Take 1 tablet by mouth daily as needed.    Dispense:  15 tablet    Refill:  0     Discussed warning signs or symptoms. Please see discharge instructions. Patient expresses understanding.   The above documentation has been reviewed and is accurate and complete Clementeen Graham, M.D.

## 2023-07-27 NOTE — Patient Instructions (Addendum)
Thank you for coming in today.   You should hear from Gastroenterology soon.   If not improving in the new year next step could be an MRI. Let me know.

## 2023-07-28 ENCOUNTER — Ambulatory Visit: Payer: Medicare Other | Admitting: Cardiovascular Disease

## 2023-08-02 ENCOUNTER — Encounter (HOSPITAL_COMMUNITY): Payer: Self-pay | Admitting: Licensed Clinical Social Worker

## 2023-08-02 ENCOUNTER — Ambulatory Visit (HOSPITAL_COMMUNITY): Payer: Medicare Other | Admitting: Licensed Clinical Social Worker

## 2023-08-02 DIAGNOSIS — F3162 Bipolar disorder, current episode mixed, moderate: Secondary | ICD-10-CM | POA: Diagnosis not present

## 2023-08-02 NOTE — Progress Notes (Signed)
Virtual Visit via Video Note  I connected with Justin [name] on 08/02/23 at  8:00 AM EST by a video enabled telemedicine application and verified that I am speaking with the correct person using two identifiers.  Location: Patient: home Provider: home office   I discussed the limitations of evaluation and management by telemedicine and the availability of in person appointments. The patient expressed understanding and agreed to proceed.   I discussed the assessment and treatment plan with the patient. The patient was provided an opportunity to ask questions and all were answered. The patient agreed with the plan and demonstrated an understanding of the instructions.   The patient was advised to call back or seek an in-person evaluation if the symptoms worsen or if the condition fails to improve as anticipated.  I provided 45 minutes of non-face-to-face time during this encounter.   Justin Melter, LCSW   THERAPIST PROGRESS NOTE  Session Time: 8:00am-8:45am  Participation Level: Active  Behavioral Response: CasualAlertEuthymic  Type of Therapy: Individual Therapy  Treatment Goals addressed: LTG: Reduce frequency, intensity, and duration of depression symptoms so that daily functioning is improved   ProgressTowards Goals: Progressing  Interventions: CBT  Summary: Justin [name] is a 77 y.o. male who presents with Bipolar I, mixed, moderate.   Suicidal/Homicidal: Nowithout intent/plan  Therapist Response: Justin [name] engaged well in individual virtual session with Facilities manager.  Clinician utilized CBT to process thoughts feelings and behaviors.  Clinician explored interactions with family members over Thanksgiving.  Justin [name] shared that he decided not to go to his son's house for Thanksgiving due to pain in his leg, and also concerns about the large crowd at his son's house.  Clinician explored relationship with son and noted some improvement in their communication and in more regular  contact with his son.  Clinician explored health concerns.  Justin [name] shared updates about recent tests and scans on leg.  Justin [name] shared that he has to come to terms with his aging noting that he cannot do what he used to do.  Justin [name] discussed the overall life improvements since getting his dog.  He shared gratitude for the companionship and support.  Plan: Return again in 3 weeks.  Diagnosis: Bipolar 1 disorder, mixed, moderate (HCC)  Collaboration of Care: Patient refused AEB none required  Patient/Guardian was advised Release of Information must be obtained prior to any record release in order to collaborate their care with an outside provider. Patient/Guardian was advised if they have not already done so to contact the registration department to sign all necessary forms in order for [place] to release information regarding their care.   Consent: Patient/Guardian gives verbal consent for treatment and assignment of benefits for services provided during this visit. Patient/Guardian expressed understanding and agreed to proceed.   Justin [name] [place], LCSW 08/02/2023

## 2023-08-24 ENCOUNTER — Ambulatory Visit (HOSPITAL_COMMUNITY): Payer: Medicare Other | Admitting: Licensed Clinical Social Worker

## 2023-08-24 DIAGNOSIS — F3161 Bipolar disorder, current episode mixed, mild: Secondary | ICD-10-CM

## 2023-08-25 ENCOUNTER — Encounter (HOSPITAL_COMMUNITY): Payer: Self-pay | Admitting: Licensed Clinical Social Worker

## 2023-08-25 NOTE — Progress Notes (Signed)
 Virtual Visit via Video Note  I connected with Justin [name] on 08/24/23 at 11:00 AM EST by a video enabled telemedicine application and verified that I am speaking with the correct person using two identifiers.  Location: Patient: home Provider: home office   I discussed the limitations of evaluation and management by telemedicine and the availability of in person appointments. The patient expressed understanding and agreed to proceed.   I discussed the assessment and treatment plan with the patient. The patient was provided an opportunity to ask questions and all were answered. The patient agreed with the plan and demonstrated an understanding of the instructions.   The patient was advised to call back or seek an in-person evaluation if the symptoms worsen or if the condition fails to improve as anticipated.  I provided 45 minutes of non-face-to-face time during this encounter.   Justin JONELLE Rosser, LCSW   THERAPIST PROGRESS NOTE  Session Time: 11:00am-11:45am   Participation Level: Active  Behavioral Response: NeatAlertEuthymic  Type of Therapy: Individual Therapy  Treatment Goals addressed: LTG: Reduce frequency, intensity, and duration of depression symptoms so that daily functioning is improved   ProgressTowards Goals: Progressing  Interventions: CBT  Summary: Justin [name] is a 78 y.o. male who presents with Bipolar I, mixed, mild.   Suicidal/Homicidal: Nowithout intent/plan  Therapist Response: Justin [name] engaged well in individual virtual session with facilities manager.  Clinician utilized CBT to process thoughts feelings and interactions.  Clinician discussed relationships with Justin [name], Justin [name], and Justin [name].  Justin [name] shared updates on interactions with family over the holidays.  Justin [name] shared increased interactions and visiting with Justin [name], Justin [name], and Justin [name].  Clinician explored interactions with these family members and noted improvement in comfort and sense of belongingness.   Clinician discussed risks feelings about his health, and his abilities to complete the same kinds of tasks around the house, in the yard, etc. that he had in the past.  Justin [name] shared that he is coming to realize that his body is unable to do a lot of the things that he used to be able to.  Clinician normalized this experience and identified the difficulty in this acceptance of his own abilities.  Plan: Return again in 2 weeks.  Diagnosis: Bipolar 1 disorder, mixed, mild (HCC)  Collaboration of Care: Patient refused AEB none required  Patient/Guardian was advised Release of Information must be obtained prior to any record release in order to collaborate their care with an outside provider. Patient/Guardian was advised if they have not already done so to contact the registration department to sign all necessary forms in order for us  to release information regarding their care.   Consent: Patient/Guardian gives verbal consent for treatment and assignment of benefits for services provided during this visit. Patient/Guardian expressed understanding and agreed to proceed.   Justin JONELLE [place], LCSW 08/25/2023

## 2023-08-28 ENCOUNTER — Ambulatory Visit: Payer: Medicare Other | Admitting: Cardiovascular Disease

## 2023-09-05 ENCOUNTER — Telehealth: Payer: Self-pay | Admitting: Cardiovascular Disease

## 2023-09-05 NOTE — Telephone Encounter (Signed)
 Pt c/o medication issue:  1. Name of Medication: rivaroxaban  (XARELTO ) 20 MG TABS tablet   2. How are you currently taking this medication (dosage and times per day)? As prescribed   3. Are you having a reaction (difficulty breathing--STAT)? No   4. What is your medication issue? Patient states he went to go pick up medication and they said it will cost $311. He spoke with his insurance who advised it would go down next month, but he cannot afford this. Patient has one tablet left and is wanting to know if he can stop the medication until he can afford it and continue on plavix . Please advise.

## 2023-09-06 ENCOUNTER — Ambulatory Visit (HOSPITAL_COMMUNITY): Payer: Medicare Other | Admitting: Licensed Clinical Social Worker

## 2023-09-06 ENCOUNTER — Encounter (HOSPITAL_COMMUNITY): Payer: Self-pay | Admitting: Licensed Clinical Social Worker

## 2023-09-06 ENCOUNTER — Telehealth: Payer: Self-pay | Admitting: Cardiovascular Disease

## 2023-09-06 DIAGNOSIS — F3161 Bipolar disorder, current episode mixed, mild: Secondary | ICD-10-CM

## 2023-09-06 MED ORDER — CLOPIDOGREL BISULFATE 75 MG PO TABS: 75.0000 mg | ORAL_TABLET | Freq: Every day | ORAL | 3 refills | Status: DC

## 2023-09-06 NOTE — Progress Notes (Signed)
 Virtual Visit via Video Note  I connected with Justin [name] on 09/06/23 at  2:30 PM EST by a video enabled telemedicine application and verified that I am speaking with the correct person using two identifiers.  Location: Patient: home Provider: home office   I discussed the limitations of evaluation and management by telemedicine and the availability of in person appointments. The patient expressed understanding and agreed to proceed.   I discussed the assessment and treatment plan with the patient. The patient was provided an opportunity to ask questions and all were answered. The patient agreed with the plan and demonstrated an understanding of the instructions.   The patient was advised to call back or seek an in-person evaluation if the symptoms worsen or if the condition fails to improve as anticipated.  I provided 45 minutes of non-face-to-face time during this encounter.   Justin Dago, LCSW   THERAPIST PROGRESS NOTE  Session Time: 2:30pm-3:15pm  Participation Level: Active  Behavioral Response: NeatAlertIrritable  Type of Therapy: Individual Therapy  Treatment Goals addressed: LTG: Reduce frequency, intensity, and duration of depression symptoms so that daily functioning is improved   ProgressTowards Goals: Progressing  Interventions: CBT  Summary: Justin [name] is a 78 y.o. male who presents with Bipolar I, mixed mild.   Suicidal/Homicidal: Nowithout intent/plan  Therapist Response: [name] engaged well in individual virtual session with Facilities manager.  Clinician utilized CBT to process thoughts feelings and interactions.  Clinician explored relationships with family and friends.  Clinician explored challenges with understanding the perspective of his family.  Clinician identified the importance of focusing on what he can control, and not so much about the behaviors of others.  Justin [name] shared he is making progress in coping.  Justin [name] shared some health concerns  regarding his memory.  Justin [name] reports mood has been relatively stable and no suicidal thoughts since December.  Plan: Return again in 2 weeks.  Diagnosis: Bipolar 1 disorder, mixed, mild (HCC)  Collaboration of Care: Psychiatrist AEB updated Dr. Carlos Chesterfield  Patient/Guardian was advised Release of Information must be obtained prior to any record release in order to collaborate their care with an outside provider. Patient/Guardian was advised if they have not already done so to contact the registration department to sign all necessary forms in order for us  to release information regarding their care.   Consent: Patient/Guardian gives verbal consent for treatment and assignment of benefits for services provided during this visit. Patient/Guardian expressed understanding and agreed to proceed.   Justin Stare [place], LCSW 09/06/2023

## 2023-09-06 NOTE — Telephone Encounter (Signed)
*  STAT* If patient is at the pharmacy, call can be transferred to refill team.   1. Which medications need to be refilled? (please list name of each medication and dose if known) need a new prescription for Plavix    2. Would you like to learn more about the convenience, safety, & potential cost savings by using the [facility]?    3. Are you open to using the [facility] (Type [facility]. .   4. Which pharmacy/location (including street and city if local pharmacy) is medication to be sent to? [facility] [address], [place]   5. Do they need a 30 day or 90 day supply? 30 day- please call today- out of his medicine

## 2023-09-06 NOTE — Telephone Encounter (Signed)
 Follow Up:      Patient is calling to see what was decided about his medicine.

## 2023-09-07 NOTE — Telephone Encounter (Signed)
Returned call to patient and discussed deductible information, as well as payment plan with Medicare that he can enroll in. He states he can afford the $167/month and will call his insurance.

## 2023-09-08 ENCOUNTER — Telehealth: Payer: Self-pay | Admitting: Cardiovascular Disease

## 2023-09-08 ENCOUNTER — Other Ambulatory Visit: Payer: Self-pay

## 2023-09-08 ENCOUNTER — Other Ambulatory Visit: Payer: Self-pay | Admitting: Cardiovascular Disease

## 2023-09-08 DIAGNOSIS — I4819 Other persistent atrial fibrillation: Secondary | ICD-10-CM

## 2023-09-08 MED ORDER — RIVAROXABAN 20 MG PO TABS: 20.0000 mg | ORAL_TABLET | Freq: Every day | ORAL | 5 refills | Status: DC

## 2023-09-08 NOTE — Telephone Encounter (Signed)
Prescription refill request for Xarelto received.  Indication:AFIB Last office visit:11/24 Weight:73.9  kg Age:78 Scr:0.79  11/24 CrCl:81.85  ml/min  Prescription refilled

## 2023-09-08 NOTE — Telephone Encounter (Signed)
Pt c/o medication issue:  1. Name of Medication:   rivaroxaban (XARELTO) 20 MG TABS tablet   2. How are you currently taking this medication (dosage and times per day)?   3. Are you having a reaction (difficulty breathing--STAT)?   4. What is your medication issue?   Patient stated he is now completely out of this medication and his insurance approved him getting the medication through [facility] but would not be able to get until 1/24 by mail.  Patient stated he will need to get a prescription to [facility] so he can get this medication today.

## 2023-09-19 ENCOUNTER — Ambulatory Visit (INDEPENDENT_AMBULATORY_CARE_PROVIDER_SITE_OTHER): Payer: Medicare Other | Admitting: Licensed Clinical Social Worker

## 2023-09-19 ENCOUNTER — Encounter (HOSPITAL_COMMUNITY): Payer: Self-pay | Admitting: Licensed Clinical Social Worker

## 2023-09-19 DIAGNOSIS — F3161 Bipolar disorder, current episode mixed, mild: Secondary | ICD-10-CM

## 2023-09-19 NOTE — Progress Notes (Signed)
Virtual Visit via Video Note  I connected with Justin [name] on 09/19/23 at 12:30 PM EST by a video enabled telemedicine application and verified that I am speaking with the correct person using two identifiers.  Location: Patient: home Provider: office   I discussed the limitations of evaluation and management by telemedicine and the availability of in person appointments. The patient expressed understanding and agreed to proceed.   I discussed the assessment and treatment plan with the patient. The patient was provided an opportunity to ask questions and all were answered. The patient agreed with the plan and demonstrated an understanding of the instructions.   The patient was advised to call back or seek an in-person evaluation if the symptoms worsen or if the condition fails to improve as anticipated.  I provided 45 minutes of non-face-to-face time during this encounter.   [name], LCSW   THERAPIST PROGRESS NOTE  Session Time: 12:30pm-1:15pm  Participation Level: Active  Behavioral Response: NeatAlertDysphoric and Irritable  Type of Therapy: Individual Therapy  Treatment Goals addressed:  LTG: Reduce frequency, intensity, and duration of depression symptoms so that daily functioning is improved   ProgressTowards Goals: Progressing  Interventions: CBT  Summary: Justin [name] is a 78 y.o. male who presents with Bipolar I, mixed.   Suicidal/Homicidal: Nowithout intent/plan  Therapist Response: [name] engaged well in general virtual session with Facilities manager.  Clinician utilized CBT to process thoughts feelings and interactions.  Clinician discussed depression symptoms, including appetite, sleep, and recent weight loss.  Clinician provided psychoeducation about having appropriate nutrition to improve his mental health.  Clinician noted that Justin [name] does not eat very much food during the day and typically just eats dinner.  Justin [name] shared that he lost 3 pounds this month and  is down to 159.  Justin [name] also shared increased forgetfulness and expressed a lot of concerns about his memory.  Clinician explored options for scheduling an appointment with his neurologist to test for any memory problems and potential treatments.  Plan: Return again in 2 weeks.  Diagnosis: Bipolar 1 disorder, mixed, mild (HCC)  Collaboration of Care: Psychiatrist AEB updated Dr. Lolly Mustache about appetite, weight loss, and sleep problems  Patient/Guardian was advised Release of Information must be obtained prior to any record release in order to collaborate their care with an outside provider. Patient/Guardian was advised if they have not already done so to contact the registration department to sign all necessary forms in order for [place] to release information regarding their care.   Consent: Patient/Guardian gives verbal consent for treatment and assignment of benefits for services provided during this visit. Patient/Guardian expressed understanding and agreed to proceed.   Chryl Heck [place], LCSW 09/19/2023

## 2023-09-21 ENCOUNTER — Other Ambulatory Visit: Payer: Self-pay

## 2023-09-21 ENCOUNTER — Ambulatory Visit: Payer: Medicare Other | Admitting: Family Medicine

## 2023-09-21 VITALS — BP 148/74 | HR 75 | Ht 71.0 in | Wt 170.0 lb

## 2023-09-21 DIAGNOSIS — M79644 Pain in right finger(s): Secondary | ICD-10-CM | POA: Diagnosis not present

## 2023-09-21 DIAGNOSIS — M79645 Pain in left finger(s): Secondary | ICD-10-CM

## 2023-09-21 NOTE — Progress Notes (Signed)
Rubin Payor, PhD, LAT, ATC acting as a scribe for Clementeen Graham, MD.  Justin [name] is a 78 y.o. male who presents to [facility] at [facility] today for exacerbation of his bilat thumb pain. Pt was last seen by Dr. Denyse Amass for his thumbs on 10/24/22 and was given a R 1st CMC steroid injection. L 1st CMC was injected on 10/06/22.  Today, pt reports bilat pain returned about 3 wks ago, worsening. He notes L thumb is worse than the R. He is having difficulty holding a cup of coffee and trying to open bottles. Pt locates pain to the 1st [facility] joint and into the 1st MCP joint.   Dx imaging: 10/06/2022 R & L hand XR 06/09/17 R wrist XR  Pertinent review of systems: No fevers or chills  Relevant historical information: Coronary artery disease.   Exam:  BP (!) 148/74   Pulse 75   Ht 5\' 11"  (1.803 m)   Wt 170 lb (77.1 kg)   SpO2 98%   BMI 23.71 kg/m  General: Well Developed, well nourished, and in no acute distress.   MSK: Has bilaterally bossing at first [facility].    Lab and Radiology Results  Procedure: Real-time Ultrasound Guided Injection of left first [facility] Device: Philips Affiniti 50G/GE Logiq Images permanently stored and available for review in PACS Verbal informed consent obtained.  Discussed risks and benefits of procedure. Warned about infection, bleeding, hyperglycemia damage to structures among others. Patient expresses understanding and agreement Time-out conducted.   Noted no overlying erythema, induration, or other signs of local infection.   Skin prepped in a sterile fashion.   Local anesthesia: Topical Ethyl chloride.   With sterile technique and under real time ultrasound guidance: 20 mg of Kenalog and 0.5 ml of lidocaine injected into CMC. Fluid seen entering the joint capsule.   Completed without difficulty   Pain immediately resolved suggesting accurate placement of the medication.   Advised to call if fevers/chills, erythema, induration, drainage,  or persistent bleeding.   Images permanently stored and available for review in the ultrasound unit.  Impression: Technically successful ultrasound guided injection.    Procedure: Real-time Ultrasound Guided Injection of right first [facility] Device: Philips Affiniti 50G/GE Logiq Images permanently stored and available for review in PACS Verbal informed consent obtained.  Discussed risks and benefits of procedure. Warned about infection, bleeding, hyperglycemia damage to structures among others. Patient expresses understanding and agreement Time-out conducted.   Noted no overlying erythema, induration, or other signs of local infection.   Skin prepped in a sterile fashion.   Local anesthesia: Topical Ethyl chloride.   With sterile technique and under real time ultrasound guidance: 20 mg of Kenalog and 0.5 mL of lidocaine injected into first CMC joint. Fluid seen entering the joint capsule.   Completed without difficulty   Pain immediately resolved suggesting accurate placement of the medication.   Advised to call if fevers/chills, erythema, induration, drainage, or persistent bleeding.   Images permanently stored and available for review in the ultrasound unit.  Impression: Technically successful ultrasound guided injection.         Assessment and Plan: 78 y.o. 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  Plan for steroid injection today.  Previous injection was in February and March 2024.  Recheck as needed.   PDMP not reviewed this encounter. Orders Placed This Encounter  Procedures   [place] LIMITED JOINT SPACE STRUCTURES UP BILAT(NO LINKED CHARGES)  Reason for Exam (SYMPTOM  OR DIAGNOSIS REQUIRED):   bilateral thumb pain    Preferred imaging location?:   [facility]   No orders of the defined types were placed in this encounter.    Discussed warning signs or symptoms. Please see discharge instructions. Patient expresses  understanding.   The above documentation has been reviewed and is accurate and complete Clementeen Graham, M.D.

## 2023-09-21 NOTE — Patient Instructions (Addendum)
Thank you for coming in today.  You received an injection today. Seek immediate medical attention if the joint becomes red, extremely painful, or is oozing fluid.  I like the [employer] channel lock pliers.

## 2023-10-03 ENCOUNTER — Ambulatory Visit (INDEPENDENT_AMBULATORY_CARE_PROVIDER_SITE_OTHER): Payer: Medicare Other | Admitting: Licensed Clinical Social Worker

## 2023-10-03 DIAGNOSIS — F3161 Bipolar disorder, current episode mixed, mild: Secondary | ICD-10-CM | POA: Diagnosis not present

## 2023-10-06 ENCOUNTER — Encounter (HOSPITAL_COMMUNITY): Payer: Self-pay | Admitting: Licensed Clinical Social Worker

## 2023-10-06 NOTE — Progress Notes (Signed)
Virtual Visit via Video Note  I connected with Justin [name] on 10/03/23 at  3:30 PM EST by a video enabled telemedicine application and verified that I am speaking with the correct person using two identifiers.  Location: Patient: home Provider: home office   I discussed the limitations of evaluation and management by telemedicine and the availability of in person appointments. The patient expressed understanding and agreed to proceed.   I discussed the assessment and treatment plan with the patient. The patient was provided an opportunity to ask questions and all were answered. The patient agreed with the plan and demonstrated an understanding of the instructions.   The patient was advised to call back or seek an in-person evaluation if the symptoms worsen or if the condition fails to improve as anticipated.  I provided 45 minutes of non-face-to-face time during this encounter.   [name], LCSW   THERAPIST PROGRESS NOTE  Session Time: 3:30pm-4:15pm  Participation Level: Active  Behavioral Response: NeatAlertAnxious and Depressed  Type of Therapy: Individual Therapy  Treatment Goals addressed: LTG: Reduce frequency, intensity, and duration of depression symptoms so that daily functioning is improved   ProgressTowards Goals: Progressing  Interventions: CBT  Summary: Justin [name] is a 78 y.o. male who presents with Bipolar I, mixed.   Suicidal/Homicidal: Nowithout intent/plan  Therapist Response: [name] engaged well in individual virtual session with Facilities manager. Clinician utilized CBT to process thoughts, feelings, and interactions. Clinician explored interactions with family members. Clinician identified some improvement in "let them" approach to these relationships. Clinician also discussed [name]'s ability to cope with boundaries.  Justin [name] shared concern about his memory, noting increased challenges with short-term memory.  Clinician explored options for communicating  with his neurologist for additional memory testing.  Plan: Return again in 2 weeks.  Diagnosis: Bipolar 1 disorder, mixed, mild (HCC)  Collaboration of Care: Other provider involved in patient's care AEB encouraged Justin [name] to contact neurologist about brain fog and memory issues  Patient/Guardian was advised Release of Information must be obtained prior to any record release in order to collaborate their care with an outside provider. Patient/Guardian was advised if they have not already done so to contact the registration department to sign all necessary forms in order for [place] to release information regarding their care.   Consent: Patient/Guardian gives verbal consent for treatment and assignment of benefits for services provided during this visit. Patient/Guardian expressed understanding and agreed to proceed.   Chryl Heck [place], LCSW 10/06/2023

## 2023-10-16 ENCOUNTER — Telehealth: Payer: Self-pay | Admitting: Cardiovascular Disease

## 2023-10-16 MED ORDER — ROSUVASTATIN CALCIUM 40 MG PO TABS: 40.0000 mg | ORAL_TABLET | Freq: Every day | ORAL | 2 refills | Status: DC

## 2023-10-16 NOTE — Telephone Encounter (Signed)
 *  STAT* If patient is at the pharmacy, call can be transferred to refill team.   1. Which medications need to be refilled? (please list name of each medication and dose if known)   rosuvastatin (CRESTOR) 40 MG tablet   2. Which pharmacy/location (including street and city if local pharmacy) is medication to be sent to?  [facility] - [place], [place] - [address] [place] [address]    3. Do they need a 30 day or 90 day supply? 30 day   Patient is completely out of medication

## 2023-10-17 ENCOUNTER — Ambulatory Visit: Payer: Self-pay | Admitting: Internal Medicine

## 2023-10-17 NOTE — Telephone Encounter (Signed)
 Copied from CRM [id number]. Topic: Clinical - Red Word Triage >> Oct 17, 2023 12:45 PM Justin [name] wrote: Red Word that prompted transfer to Nurse Triage: Right leg swollen, hurts to walk on it, states its real sore. Reason for Disposition  [1] MODERATE leg swelling (e.g., swelling extends up to knees) AND [2] new-onset or worsening  Answer Assessment - Initial Assessment Questions 1. ONSET: "When did the swelling start?" (e.g., minutes, hours, days)     My right leg is swollen.   I saw Dr. Jonny Ruiz last Oct. And it was my left leg did the same thing.   Now my right leg is swollen.  Started Thursday  I noticed it started hurting.   It's getting bigger 2. LOCATION: "What part of the leg is swollen?"  "Are both legs swollen or just one leg?"     From around my knee up to top of my thigh.  Most of swelling is on top of my thigh on the inner side. 3. SEVERITY: "How bad is the swelling?" (e.g., localized; mild, moderate, severe)   - Localized: Small area of swelling localized to one leg.   - MILD pedal edema: Swelling limited to foot and ankle, pitting edema < 1/4 inch (6 mm) deep, rest and elevation eliminate most or all swelling.   - MODERATE edema: Swelling of lower leg to knee, pitting edema > 1/4 inch (6 mm) deep, rest and elevation only partially reduce swelling.   - SEVERE edema: Swelling extends above knee, facial or hand swelling present.      8/10 4. REDNESS: "Does the swelling look red or infected?"     No  It has bruises on it.    I'm on 2 different blood thinners.   Heart ablation done in Aug.  I have 2 heart stents in. I do have [name] lump on my leg that hurts when I press on it.  5. PAIN: "Is the swelling painful to touch?" If Yes, ask: "How painful is it?"   (Scale 1-10; mild, moderate or severe)     Yes 6. FEVER: "Do you have [name] fever?" If Yes, ask: "What is it, how was it measured, and when did it start?"      No 7. CAUSE: "What do you think is causing the leg swelling?"     I saw Dr. Caesar Chestnut  for my leg when this happened before per Dr. Jonny Ruiz. I had [name] bad bruise is what they told me about my other leg. 8. MEDICAL HISTORY: "Do you have [name] history of blood clots (e.g., DVT), cancer, heart failure, kidney disease, or liver failure?"     Heart stents X2 cardiac ablation   9. RECURRENT SYMPTOM: "Have you had leg swelling before?" If Yes, ask: "When was the last time?" "What happened that time?"     Yes in the other leg 10. OTHER SYMPTOMS: "Do you have any other symptoms?" (e.g., chest pain, difficulty breathing)       No chest pain.    I felt like I had [name] little [name]. Fib but it didn't last long. 11. PREGNANCY: "Is there any chance you are pregnant?" "When was your last menstrual period?"       N/[name]  Protocols used: Leg Swelling and Edema-[name]-AH  Chief Complaint: Swelling in right leg from knee to upper thigh that is warm and painful. Symptoms: above Frequency: Since Thur. But getting worse. Pertinent Negatives: Patient denies having blood clots in his legs in the past but does have  [name] cardiac history.  On 2 blood thinners.   Disposition: [] ED /[] Urgent Care (no appt availability in office) / [x] Appointment(In office/virtual)/ []  [facility]/ [] Home Care/ [] Refused Recommended Disposition /[] [place] [facility]/ []  Follow-up with PCP Additional Notes: Appt made for 10/18/2023

## 2023-10-18 ENCOUNTER — Ambulatory Visit: Payer: Medicare Other | Admitting: Family Medicine

## 2023-10-18 ENCOUNTER — Ambulatory Visit (INDEPENDENT_AMBULATORY_CARE_PROVIDER_SITE_OTHER): Payer: Medicare Other | Admitting: Licensed Clinical Social Worker

## 2023-10-18 ENCOUNTER — Encounter (HOSPITAL_COMMUNITY): Payer: Self-pay | Admitting: Licensed Clinical Social Worker

## 2023-10-18 ENCOUNTER — Encounter: Payer: Self-pay | Admitting: Family Medicine

## 2023-10-18 VITALS — BP 122/88 | HR 108 | Temp 98.0°F | Ht 71.0 in | Wt 169.2 lb

## 2023-10-18 DIAGNOSIS — I25119 Atherosclerotic heart disease of native coronary artery with unspecified angina pectoris: Secondary | ICD-10-CM | POA: Diagnosis not present

## 2023-10-18 DIAGNOSIS — R2241 Localized swelling, mass and lump, right lower limb: Secondary | ICD-10-CM | POA: Diagnosis not present

## 2023-10-18 DIAGNOSIS — Z7901 Long term (current) use of anticoagulants: Secondary | ICD-10-CM

## 2023-10-18 DIAGNOSIS — M79651 Pain in right thigh: Secondary | ICD-10-CM | POA: Diagnosis not present

## 2023-10-18 DIAGNOSIS — F3161 Bipolar disorder, current episode mixed, mild: Secondary | ICD-10-CM

## 2023-10-18 DIAGNOSIS — I10 Essential (primary) hypertension: Secondary | ICD-10-CM

## 2023-10-18 DIAGNOSIS — I4811 Longstanding persistent atrial fibrillation: Secondary | ICD-10-CM

## 2023-10-18 DIAGNOSIS — I7121 Aneurysm of the ascending aorta, without rupture: Secondary | ICD-10-CM

## 2023-10-18 LAB — CBC WITH DIFFERENTIAL/PLATELET
Basophils Absolute: 0 10*3/uL (ref 0.0–0.1)
Basophils Relative: 0.6 % (ref 0.0–3.0)
Eosinophils Absolute: 0 10*3/uL (ref 0.0–0.7)
Eosinophils Relative: 0 % (ref 0.0–5.0)
HCT: 37.3 % — ABNORMAL LOW (ref 39.0–52.0)
Hemoglobin: 12.1 g/dL — ABNORMAL LOW (ref 13.0–17.0)
Lymphocytes Relative: 14.4 % (ref 12.0–46.0)
Lymphs Abs: 0.7 10*3/uL (ref 0.7–4.0)
MCHC: 32.5 g/dL (ref 30.0–36.0)
MCV: 89.5 fL (ref 78.0–100.0)
Monocytes Absolute: 0.6 10*3/uL (ref 0.1–1.0)
Monocytes Relative: 11.8 % (ref 3.0–12.0)
Neutro Abs: 3.6 10*3/uL (ref 1.4–7.7)
Neutrophils Relative %: 73.2 % (ref 43.0–77.0)
Platelets: 224 10*3/uL (ref 150.0–400.0)
RBC: 4.17 Mil/uL — ABNORMAL LOW (ref 4.22–5.81)
RDW: 14.2 % (ref 11.5–15.5)
WBC: 4.9 10*3/uL (ref 4.0–10.5)

## 2023-10-18 LAB — COMPREHENSIVE METABOLIC PANEL
ALT: 22 U/L (ref 0–53)
AST: 35 U/L (ref 0–37)
Albumin: 3.9 g/dL (ref 3.5–5.2)
Alkaline Phosphatase: 56 U/L (ref 39–117)
BUN: 12 mg/dL (ref 6–23)
CO2: 26 meq/L (ref 19–32)
Calcium: 9.2 mg/dL (ref 8.4–10.5)
Chloride: 97 meq/L (ref 96–112)
Creatinine, Ser: 0.82 mg/dL (ref 0.40–1.50)
GFR: 84.79 mL/min (ref >60.00)
Glucose, Bld: 120 mg/dL — ABNORMAL HIGH (ref 70–99)
Potassium: 4.4 meq/L (ref 3.5–5.1)
Sodium: 129 meq/L — ABNORMAL LOW (ref 135–145)
Total Bilirubin: 0.5 mg/dL (ref 0.2–1.2)
Total Protein: 6.7 g/dL (ref 6.0–8.3)

## 2023-10-18 LAB — D-DIMER, QUANTITATIVE: D-Dimer, Quant: 0.92 ug{FEU}/mL — ABNORMAL HIGH (ref <0.50)

## 2023-10-18 LAB — CK: Total CK: 299 U/L — ABNORMAL HIGH (ref 7–232)

## 2023-10-18 NOTE — Progress Notes (Signed)
   Acute Office Visit  Subjective:     Patient ID: Justin [name], male    DOB: Aug 27, 1945, 78 y.o.   MRN: [id number]  Chief Complaint  Patient presents with   Acute Visit    First noticed on Sunday, right leg swelling. Hot to the touch, red, painful    HPI Patient is in today for medial right thigh pain for the last 2 days. Reports that last night there was an area about the size of a quarter that was really red, really warm to touch and very tender.  Does have history of CAD, hypertension, bipolar, HLD, A-fib, aortic insufficiency, aortic aneurysm, he is chronically anticoagulated with Xarelto 20 mg, also takes Crestor 40 mg once daily also takes Plavix 75 mg once daily. Denies any numbness, tingling, loss of strength of the extremity. Reports that he has had a similar pain that was a very large bruise under the muscle on his left leg in the past. Denies any chest pain, palpitations, dizziness, vision changes, other symptoms.   ROS Per HPI      Objective:    BP 122/88 (BP Location: Left Arm, Patient Position: Sitting, Cuff Size: Normal)   Pulse (!) 108   Temp 98 F (36.7 C) (Temporal)   Ht 5\' 11"  (1.803 m)   Wt 169 lb 3.2 oz (76.7 kg)   SpO2 99%   BMI 23.60 kg/m    Physical Exam Musculoskeletal:        General: Swelling and tenderness present.       Legs:     Comments: Area of concern: erythematous, tender, swollen 9red). Swelling, mild tenderness, mild bruising (purple)  Neurological:     General: No focal deficit present.     Mental Status: He is oriented to person, place, and time.  Psychiatric:        Mood and Affect: Mood normal.        Behavior: Behavior normal.     No results found for any visits on 10/18/23.      Assessment & Plan:  1. Right thigh pain (Primary)  - VAS [place] LOWER EXTREMITY VENOUS (DVT); Future - D-dimer, quantitative  2. Localized swelling of right lower leg  - VAS [place] LOWER EXTREMITY VENOUS (DVT); Future - CK; Future -  D-dimer, quantitative  3. Primary hypertension  - VAS [place] LOWER EXTREMITY VENOUS (DVT); Future - CBC with Differential/Platelet - Comprehensive metabolic panel - D-dimer, quantitative  4. Coronary artery disease involving native coronary artery of native heart with angina pectoris (HCC)  - VAS [place] LOWER EXTREMITY VENOUS (DVT); Future - CBC with Differential/Platelet - D-dimer, quantitative; STAT  5. Aneurysm of ascending aorta without rupture (HCC)  - VAS [place] LOWER EXTREMITY VENOUS (DVT); Future - D-dimer, quantitative  6. Longstanding persistent atrial fibrillation (HCC)  - VAS [place] LOWER EXTREMITY VENOUS (DVT); Future - D-dimer, quantitative  7. Anticoagulated  - VAS [place] LOWER EXTREMITY VENOUS (DVT); Future - D-dimer, quantitative   Ddx includes muscular etiology, will r/o DVT first   No orders of the defined types were placed in this encounter.   Return if symptoms worsen or fail to improve.  Moshe Cipro, FNP

## 2023-10-18 NOTE — Patient Instructions (Addendum)
 We are checking labs today, will be in contact with any results that require further attention  I have also ordered a vascular ultrasound to help [place] rule out a blood clot.   This will be at [address] in [place], [place]   You appt is at 11:45am

## 2023-10-18 NOTE — Progress Notes (Signed)
 Virtual Visit via Video Note  I connected with Justin [name] on 10/18/23 at 11:00 AM EST by a video enabled telemedicine application and verified that I am speaking with the correct person using two identifiers.  Location: Patient: Home Provider: Home office   I discussed the limitations of evaluation and management by telemedicine and the availability of in person appointments. The patient expressed understanding and agreed to proceed.    I discussed the assessment and treatment plan with the patient. The patient was provided an opportunity to ask questions and all were answered. The patient agreed with the plan and demonstrated an understanding of the instructions.   The patient was advised to call back or seek an in-person evaluation if the symptoms worsen or if the condition fails to improve as anticipated.  I provided 45 minutes of non-face-to-face time during this encounter.   Justin Melter, LCSW   THERAPIST PROGRESS NOTE  Session Time: 11:00am-11:45am  Participation Level: Active  Behavioral Response: NeatAlertDepressed  Type of Therapy: Individual Therapy  Treatment Goals addressed: LTG: Reduce frequency, intensity, and duration of depression symptoms so that daily functioning is improved   ProgressTowards Goals: Progressing  Interventions: CBT  Summary: Justin [name] is a 78 y.o. male who presents with Bipolar I, mixed, mild.   Suicidal/Homicidal: Nowithout intent/plan  Therapist Response: Justin [name] engaged well in individual virtual session with Facilities manager.  Clinician utilized CBT to process thoughts feelings and interactions.  Clinician identified increase in pain and depressed mood.  Justin [name] shared updates about physical health and mental health.  Clinician provided time and space for Justin [name] to process regrets and traumatic experiences from his late teens and early 3s.  Justin [name] shared a great deal of guilt and shame about what happened to him during the draft to  Justin [name].  Justin [name] shared the response from his father, which was a great deal of disappointment.  Justin [name] identified that this has been buried for 56 years and he felt grateful to release this.  Plan: Return again in 2 weeks.  Diagnosis: Bipolar 1 disorder, mixed, mild (HCC)  Collaboration of Care: Patient refused AEB none required  Patient/Guardian was advised Release of Information must be obtained prior to any record release in order to collaborate their care with an outside provider. Patient/Guardian was advised if they have not already done so to contact the registration department to sign all necessary forms in order for [place] to release information regarding their care.   Consent: Patient/Guardian gives verbal consent for treatment and assignment of benefits for services provided during this visit. Patient/Guardian expressed understanding and agreed to proceed.   Justin Heck [place], LCSW 10/18/2023

## 2023-10-19 ENCOUNTER — Telehealth: Payer: Self-pay | Admitting: Internal Medicine

## 2023-10-19 ENCOUNTER — Ambulatory Visit (HOSPITAL_COMMUNITY)
Admission: RE | Admit: 2023-10-19 | Discharge: 2023-10-19 | Disposition: A | Discharge status: Home / Self Care | Payer: Medicare Other | Source: Ambulatory Visit | Attending: Cardiology | Admitting: Cardiology

## 2023-10-19 DIAGNOSIS — I7121 Aneurysm of the ascending aorta, without rupture: Secondary | ICD-10-CM | POA: Diagnosis present

## 2023-10-19 DIAGNOSIS — M79651 Pain in right thigh: Secondary | ICD-10-CM | POA: Diagnosis present

## 2023-10-19 DIAGNOSIS — I25119 Atherosclerotic heart disease of native coronary artery with unspecified angina pectoris: Secondary | ICD-10-CM | POA: Diagnosis present

## 2023-10-19 DIAGNOSIS — I4811 Longstanding persistent atrial fibrillation: Secondary | ICD-10-CM | POA: Insufficient documentation

## 2023-10-19 DIAGNOSIS — Z7901 Long term (current) use of anticoagulants: Secondary | ICD-10-CM | POA: Insufficient documentation

## 2023-10-19 DIAGNOSIS — I10 Essential (primary) hypertension: Secondary | ICD-10-CM | POA: Diagnosis present

## 2023-10-19 DIAGNOSIS — R2241 Localized swelling, mass and lump, right lower limb: Secondary | ICD-10-CM | POA: Insufficient documentation

## 2023-10-19 MED ORDER — CYCLOBENZAPRINE HCL 5 MG PO TABS: 5.0000 mg | ORAL_TABLET | Freq: Three times a day (TID) | ORAL | 1 refills | Status: DC | PRN

## 2023-10-19 NOTE — Telephone Encounter (Signed)
 Copied from CRM [id number]. Topic: Clinical - Medication Refill >> Oct 19, 2023 11:37 AM [name] wrote: Most Recent Primary Care Visit:  Provider: Corwin Levins  Department: [facility]  Visit Type: OFFICE VISIT  Date: 07/05/2023  Medication: muscle relaxer   Has the patient contacted their pharmacy? No (Agent: If no, request that the patient contact the pharmacy for the refill. If patient does not wish to contact the pharmacy document the reason why and proceed with request.) (Agent: If yes, when and what did the pharmacy advise?)  Is this the correct pharmacy for this prescription? Yes If no, delete pharmacy and type the correct one.  This is the patient's preferred pharmacy:  [facility] - [place], [place] - [address] [place] [address] Phone: [phone] Fax: [phone]  [facility] [address], [place] - [address] [facility] [place] [address] Phone: [phone] Fax: [phone]  [facility] [address] [place] [address] Phone: [phone] Fax: [phone]   Has the prescription been filled recently? No  Is the patient out of the medication?new medication  Has the patient been seen for an appointment in the last year OR does the patient have an upcoming appointment? Yes  Can we respond through MyChart? No  Agent: Please be advised that Rx refills may take up to 3 business days. We ask that you follow-up with your pharmacy.

## 2023-10-19 NOTE — Telephone Encounter (Signed)
 Ok - flexeril 5 tid prn - done erx

## 2023-10-20 ENCOUNTER — Telehealth (HOSPITAL_COMMUNITY): Payer: Medicare Other | Admitting: Psychiatry

## 2023-10-20 ENCOUNTER — Encounter (HOSPITAL_COMMUNITY): Payer: Self-pay | Admitting: Psychiatry

## 2023-10-20 VITALS — Wt 169.0 lb

## 2023-10-20 DIAGNOSIS — F3162 Bipolar disorder, current episode mixed, moderate: Secondary | ICD-10-CM | POA: Diagnosis not present

## 2023-10-20 DIAGNOSIS — F4310 Post-traumatic stress disorder, unspecified: Secondary | ICD-10-CM

## 2023-10-20 DIAGNOSIS — F5101 Primary insomnia: Secondary | ICD-10-CM | POA: Diagnosis not present

## 2023-10-20 MED ORDER — LAMOTRIGINE 200 MG PO TABS: 200.0000 mg | ORAL_TABLET | Freq: Every day | ORAL | 2 refills | Status: DC

## 2023-10-20 MED ORDER — TRAMADOL HCL 50 MG PO TABS: 50.0000 mg | ORAL_TABLET | Freq: Three times a day (TID) | ORAL | 0 refills | Status: AC | PRN

## 2023-10-20 MED ORDER — CHLORPROMAZINE HCL 10 MG PO TABS: 10.0000 mg | ORAL_TABLET | Freq: Every day | ORAL | 0 refills | Status: DC

## 2023-10-20 NOTE — Progress Notes (Signed)
 [facility] MD Virtual Progress Note   Patient Location: Home Provider Location: Office  I connect with patient by video and verified that I am speaking with correct person by using two identifiers. I discussed the limitations of evaluation and management by telemedicine and the availability of in person appointments. I also discussed with the patient that there may be a patient responsible charge related to this service. The patient expressed understanding and agreed to proceed.  Justin [name] [id number] 78 y.o.  10/20/2023 10:26 AM  History of Present Illness:  Patient is evaluated by phone session.  He told he cannot do video session at this time.  He reported some anxiety as last visit with Shanda Bumps he talk about his past about draft for [place] war and issues with the father.  Patient told that lately making him more wired and he could not sleep.  Patient has chronic insomnia.  He recall having a sleep study more than 15 years ago but do not remember the results very well.  He reported lately his nightmares and flashbacks are coming back.  He is only sleeping few hours.  He denies any mania, psychosis, hallucination or any paranoia.  He is taking Lamictal which is keeping his mood stable most of the time.  He also prescribed Xanax from his primary care but he is cutting down and only take one fourth to half tablet as needed.  He has not taken in past few weeks until last night he took one fourth.  He also concerned about his leg pain which is not getting better.  He has ultrasound but he was told no clot.  He has difficulty walking and driving but he had not gave up his driving yet.  His appetite is okay.  He has no rash or any itching.  He like to try something to help his sleep.  Past Psychiatric History: H/O suicidal attempt by overdose on Ambien twice, hydroxyzine and alcohol. H/O bipolar disorder with multiple inpatient.  Did IOP. Last ER visit Sept 2020 and inpatient October  2018 at [facility]. Tried Zyprexa, Cymbalta, Seroquel, Vistaril, Neurontin, Elavil, Wellbutrin and triptal. Tried low-dose doxepin and trazodone. Had a reaction with Ativan and pain medication.  Saw Dr. Emerson Monte in past.    Outpatient Encounter Medications as of 10/20/2023  Medication Sig   ALPRAZolam (XANAX) 1 MG tablet TAKE 1/2 TO 1 TABLET BY MOUTH ONCE DAILY   AMBULATORY NON FORMULARY MEDICATION Thigh compression stocking  Disp 1   clopidogrel (PLAVIX) 75 MG tablet Take 1 tablet (75 mg total) by mouth daily with breakfast.   cyclobenzaprine (FLEXERIL) 5 MG tablet Take 1 tablet (5 mg total) by mouth 3 (three) times daily as needed.   diltiazem (CARDIZEM CD) 180 MG 24 hr capsule Take 1 capsule (180 mg total) by mouth daily.   isosorbide mononitrate (IMDUR) 30 MG 24 hr tablet TAKE 1 TABLET BY MOUTH DAILY   lamoTRIgine (LAMICTAL) 200 MG tablet Take 1 tablet (200 mg total) by mouth daily.   nitroGLYCERIN (NITROSTAT) 0.4 MG SL tablet Place 1 tablet (0.4 mg total) under the tongue every 5 (five) minutes as needed for chest pain.   pantoprazole (PROTONIX) 40 MG tablet Take 1 tablet (40 mg total) by mouth daily.   rivaroxaban (XARELTO) 20 MG TABS tablet Take 1 tablet (20 mg total) by mouth daily.   rosuvastatin (CRESTOR) 40 MG tablet Take 1 tablet (40 mg total) by mouth daily.   No facility-administered encounter medications on file  as of 10/20/2023.    Recent Results (from the past 2160 hours)  CBC with Differential/Platelet     Status: Abnormal   Collection Time: 10/18/23  3:39 PM  Result Value Ref Range   WBC 4.9 4.0 - 10.5 K/uL   RBC 4.17 (L) 4.22 - 5.81 Mil/uL   Hemoglobin 12.1 (L) 13.0 - 17.0 g/dL   HCT 16.1 (L) 09.6 - 04.5 %   MCV 89.5 78.0 - 100.0 fl   MCHC 32.5 30.0 - 36.0 g/dL   RDW 40.9 81.1 - 91.4 %   Platelets 224.0 150.0 - 400.0 K/uL   Neutrophils Relative % 73.2 43.0 - 77.0 %   Lymphocytes Relative 14.4 12.0 - 46.0 %   Monocytes Relative 11.8 3.0 - 12.0 %   Eosinophils  Relative 0.0 0.0 - 5.0 %   Basophils Relative 0.6 0.0 - 3.0 %   Neutro Abs 3.6 1.4 - 7.7 K/uL   Lymphs Abs 0.7 0.7 - 4.0 K/uL   Monocytes Absolute 0.6 0.1 - 1.0 K/uL   Eosinophils Absolute 0.0 0.0 - 0.7 K/uL   Basophils Absolute 0.0 0.0 - 0.1 K/uL  Comprehensive metabolic panel     Status: Abnormal   Collection Time: 10/18/23  3:39 PM  Result Value Ref Range   Sodium 129 (L) 135 - 145 mEq/L   Potassium 4.4 3.5 - 5.1 mEq/L   Chloride 97 96 - 112 mEq/L   CO2 26 19 - 32 mEq/L   Glucose, Bld 120 (H) 70 - 99 mg/dL   BUN 12 6 - 23 mg/dL   Creatinine, Ser 7.82 0.40 - 1.50 mg/dL   Total Bilirubin 0.5 0.2 - 1.2 mg/dL   Alkaline Phosphatase 56 39 - 117 U/L   AST 35 0 - 37 U/L   ALT 22 0 - 53 U/L   Total Protein 6.7 6.0 - 8.3 g/dL   Albumin 3.9 3.5 - 5.2 g/dL   GFR 95.62 >13.08 mL/min    Comment: Calculated using the CKD-EPI Creatinine Equation (2021)   Calcium 9.2 8.4 - 10.5 mg/dL  D-dimer, quantitative     Status: Abnormal   Collection Time: 10/18/23  3:39 PM  Result Value Ref Range   D-Dimer, Quant 0.92 (H) <0.50 mcg/mL FEU    Comment: Elevated D-dimer levels are associated with DIC, malignancies, inflammation, sepsis, surgery, trauma, and pregnancy. A D-dimer result less than 0.5 mcg/mL  FEU, in conjunction with a non-high clinical pre-test  probability assessment model, excludes deep vein thrombosis and pulmonary embolism. However, since  D-dimer values increase with age, the [employer] recommends an age-adjusted cut-off value in patients older than 50. The calculation for an age adjusted cut-off value is age (years) x 0.01 mcg/mL  FEU. For example, the cut-off for a 78 year old patient would be 70 x 0.01 mcg/mL FEU. . For additional information, please refer to http://education.[employer].com/faq/FAQ149 (This link is being provided for  informational/educational purposes only.)   CK     Status: Abnormal   Collection Time: 10/18/23  3:39 PM   Result Value Ref Range   Total CK 299 (H) 7 - 232 U/L     Psychiatric Specialty Exam: Physical Exam  Review of Systems  Musculoskeletal:        Leg pain    Weight 169 lb (76.7 kg).There is no height or weight on file to calculate BMI.  General Appearance: NA  Eye Contact:  NA  Speech:  Slow  Volume:  Decreased  Mood:  Dysphoric  Affect:  NA  Thought Process:  Descriptions of Associations: Intact  Orientation:  Full (Time, Place, and Person)  Thought Content:  Rumination  Suicidal Thoughts:  No  Homicidal Thoughts:  No  Memory:  Immediate;   Good Recent;   Good Remote;   Fair  Judgement:  Fair  Insight:  Fair  Psychomotor Activity:  Decreased  Concentration:  Concentration: Good and Attention Span: Good  Recall:  Good  Fund of Knowledge:  Good  Language:  Good  Akathisia:  No  Handed:  Right  AIMS (if indicated):     Assets:  Communication Skills Desire for Improvement Housing Transportation  ADL's:  Intact  Cognition:  WNL  Sleep:  fair     Assessment/Plan: Bipolar 1 disorder, mixed, moderate (HCC) - Plan: lamoTRIgine (LAMICTAL) 200 MG tablet, chlorproMAZINE (THORAZINE) 10 MG tablet  PTSD (post-traumatic stress disorder) - Plan: lamoTRIgine (LAMICTAL) 200 MG tablet, chlorproMAZINE (THORAZINE) 10 MG tablet  Primary insomnia  I reviewed blood work results.  Sodium 129, RBC 4.17.  He has CPK which is 299.  BUN/creatinine stable.  Discussed reconsidering sleep study as patient has chronic insomnia.  I also reviewed previous medication is most of the medication either did not work or caused side effects.  He used to take trazodone, hydroxyzine, Seroquel and amitriptyline but either they did not work or had side effects.  Recommended trial low-dose Thorazine which she had never tried before.  I explained we can try 10 mg that can help her sleep and chronic PTSD symptoms.  Patient agreed to give a try for 30 days and if needed then he can ask for refills.  Encouraged to  keep appointment with Shanda Bumps.  Continue lamotrigine 200 mg daily.  Patient will contact his primary care to get sleep study.  Follow-up in 3 months unless patient require earlier appointment.Next appointment in person.   Follow Up Instructions:     I discussed the assessment and treatment plan with the patient. The patient was provided an opportunity to ask questions and all were answered. The patient agreed with the plan and demonstrated an understanding of the instructions.   The patient was advised to call back or seek an in-person evaluation if the symptoms worsen or if the condition fails to improve as anticipated.    Collaboration of Care: Other provider involved in patient's care AEB notes are available in epic to review  Patient/Guardian was advised Release of Information must be obtained prior to any record release in order to collaborate their care with an outside provider. Patient/Guardian was advised if they have not already done so to contact the registration department to sign all necessary forms in order for [place] to release information regarding their care.   Consent: Patient/Guardian gives verbal consent for treatment and assignment of benefits for services provided during this visit. Patient/Guardian expressed understanding and agreed to proceed.     I provided 31 minutes of non face to face time during this encounter.  Note: This document was prepared by [employer] voice dictation technology and any errors that results from this process are unintentional.    Cleotis Nipper, MD 10/20/2023

## 2023-10-20 NOTE — Addendum Note (Signed)
 Addended by: Sherald Barge on: 10/20/2023 11:16 AM   Modules accepted: Orders

## 2023-10-20 NOTE — Telephone Encounter (Signed)
 Patient is aware

## 2023-11-02 ENCOUNTER — Ambulatory Visit (INDEPENDENT_AMBULATORY_CARE_PROVIDER_SITE_OTHER): Payer: Medicare Other | Admitting: Licensed Clinical Social Worker

## 2023-11-02 DIAGNOSIS — F3162 Bipolar disorder, current episode mixed, moderate: Secondary | ICD-10-CM | POA: Diagnosis not present
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
## 2023-11-11 ENCOUNTER — Encounter (HOSPITAL_COMMUNITY): Payer: Self-pay | Admitting: Licensed Clinical Social Worker

## 2023-11-23 ENCOUNTER — Ambulatory Visit (HOSPITAL_COMMUNITY): Admitting: Licensed Clinical Social Worker

## 2023-11-23 ENCOUNTER — Encounter (HOSPITAL_COMMUNITY): Payer: Self-pay | Admitting: Licensed Clinical Social Worker

## 2023-11-23 DIAGNOSIS — F3162 Bipolar disorder, current episode mixed, moderate: Secondary | ICD-10-CM

## 2023-11-23 NOTE — Progress Notes (Signed)
 Virtual Visit via Video Note  I connected with Justin [name] on 11/23/23 at  2:30 PM EDT by a video enabled telemedicine application and verified that I am speaking with the correct person using two identifiers.  Location: Patient: home Provider: home office   I discussed the limitations of evaluation and management by telemedicine and the availability of in person appointments. The patient expressed understanding and agreed to proceed.   I discussed the assessment and treatment plan with the patient. The patient was provided an opportunity to ask questions and all were answered. The patient agreed with the plan and demonstrated an understanding of the instructions.   The patient was advised to call back or seek an in-person evaluation if the symptoms worsen or if the condition fails to improve as anticipated.  I provided 45 minutes of non-face-to-face time during this encounter.   Justin Melter, LCSW   THERAPIST PROGRESS NOTE  Session Time: 2:30pm-3:15pm  Participation Level: Active  Behavioral Response: NeatAlertDepressed  Type of Therapy: Individual Therapy  Treatment Goals addressed:  LTG: Reduce frequency, intensity, and duration of depression symptoms so that daily functioning is improved   ProgressTowards Goals: Progressing  Interventions: Solution Focused  Summary: Justin [name] is a 78 y.o. male who presents with Bipolar I, mixed, moderate.   Suicidal/Homicidal: Nowithout intent/plan  Therapist Response: Justin [name] engaged well in individual virtual session with Facilities manager. Clinician utilized Solution-Focused therapy to process current stressors and explored thoughts and feelings. Clinician processed recent challenges with son regarding their relationship and communication. Clinician identified recent stressor of the 8th anniversary of wife's death. Clinician explored the incidents of that day and Justin [name]'s coping/grief. Justin [name] shared he has done better every year with  processing the loss. He also shared that letting go of past by telling his "shameful" story has really helped him. Clinician encouraged Justin [name] to continue sharing his stories in order to let his son understand him better. Justin [name] shared he does not tell anyone about these things because the shame is so great. However, he reports he has been emotionally stable. He did have an episode of A-Fib the other night and he shared that he will be going to see his dr for follow up.   Plan: Return again in 3-4 weeks.  Diagnosis: Bipolar 1 disorder, mixed, moderate (HCC)  Collaboration of Care: Patient refused AEB none required  Patient/Guardian was advised Release of Information must be obtained prior to any record release in order to collaborate their care with an outside provider. Patient/Guardian was advised if they have not already done so to contact the registration department to sign all necessary forms in order for [place] to release information regarding their care.   Consent: Patient/Guardian gives verbal consent for treatment and assignment of benefits for services provided during this visit. Patient/Guardian expressed understanding and agreed to proceed.   Justin Heck [employer], LCSW 11/23/2023

## 2023-12-26 ENCOUNTER — Ambulatory Visit (HOSPITAL_COMMUNITY): Admitting: Licensed Clinical Social Worker

## 2023-12-26 ENCOUNTER — Ambulatory Visit (INDEPENDENT_AMBULATORY_CARE_PROVIDER_SITE_OTHER): Payer: Medicare Other

## 2023-12-26 VITALS — Ht 71.0 in | Wt 169.0 lb

## 2023-12-26 DIAGNOSIS — Z0000 Encounter for general adult medical examination without abnormal findings: Secondary | ICD-10-CM | POA: Diagnosis not present

## 2023-12-26 DIAGNOSIS — K08109 Complete loss of teeth, unspecified cause, unspecified class: Secondary | ICD-10-CM

## 2023-12-26 DIAGNOSIS — H9193 Unspecified hearing loss, bilateral: Secondary | ICD-10-CM

## 2023-12-26 NOTE — Progress Notes (Signed)
 Subjective:   Justin [name] is a 78 y.o. who presents for a Medicare Wellness preventive visit.  Visit Complete: Virtual I connected with  Justin [name] on 12/26/23 by a audio enabled telemedicine application and verified that I am speaking with the correct person using two identifiers.  Patient Location: Home  Provider Location: Office/Clinic  I discussed the limitations of evaluation and management by telemedicine. The patient expressed understanding and agreed to proceed.  Vital Signs: Because this visit was a virtual/telehealth visit, some criteria may be missing or patient reported. Any vitals not documented were not able to be obtained and vitals that have been documented are patient reported.  VideoDeclined- This patient declined Librarian, academic. Therefore the visit was completed with audio only.  Persons Participating in Visit: Patient.  AWV Questionnaire: No: Patient Medicare AWV questionnaire was not completed prior to this visit.  Cardiac Risk Factors include: advanced age (>4men, >27 women);hypertension;dyslipidemia;male gender     Objective:    Today's Vitals   12/26/23 1436  Weight: 169 lb (76.7 kg)  Height: 5\' 11"  (1.803 m)   Body mass index is 23.57 kg/m.     12/26/2023    2:33 PM 07/14/2023    3:18 PM 06/15/2023    8:51 AM 02/14/2023   11:50 AM 12/22/2022    1:16 PM 10/24/2022    1:09 PM 09/23/2022    9:18 AM  Advanced Directives  Does Patient Have a Medical Advance Directive? Yes No Yes Yes Yes Yes Yes  Type of Estate agent of [place];Living will  Healthcare Power of [employer] of [place];Living will Healthcare Power of [place];Living will  Healthcare Power of [place];Living will  Does patient want to make changes to medical advance directive?   No - Patient declined No - Patient declined  No - Patient declined   Copy of Healthcare Power of Attorney in Chart? No - copy requested  No -  copy requested  No - copy requested  No - copy requested    Current Medications (verified) Outpatient Encounter Medications as of 12/26/2023  Medication Sig   ALPRAZolam  (XANAX ) 1 MG tablet TAKE 1/2 TO 1 TABLET BY MOUTH ONCE DAILY   clopidogrel  (PLAVIX ) 75 MG tablet Take 1 tablet (75 mg total) by mouth daily with breakfast.   cyclobenzaprine  (FLEXERIL ) 5 MG tablet Take 1 tablet (5 mg total) by mouth 3 (three) times daily as needed.   diltiazem  (CARDIZEM  CD) 180 MG 24 hr capsule Take 1 capsule (180 mg total) by mouth daily.   isosorbide  mononitrate (IMDUR ) 30 MG 24 hr tablet TAKE 1 TABLET BY MOUTH DAILY   lamoTRIgine  (LAMICTAL ) 200 MG tablet Take 1 tablet (200 mg total) by mouth daily.   nitroGLYCERIN  (NITROSTAT ) 0.4 MG SL tablet Place 1 tablet (0.4 mg total) under the tongue every 5 (five) minutes as needed for chest pain.   pantoprazole  (PROTONIX ) 40 MG tablet Take 1 tablet (40 mg total) by mouth daily.   rivaroxaban  (XARELTO ) 20 MG TABS tablet Take 1 tablet (20 mg total) by mouth daily.   rosuvastatin  (CRESTOR ) 40 MG tablet Take 1 tablet (40 mg total) by mouth daily.   chlorproMAZINE  (THORAZINE ) 10 MG tablet Take 1 tablet (10 mg total) by mouth at bedtime. (Patient not taking: Reported on 12/26/2023)   No facility-administered encounter medications on file as of 12/26/2023.    Allergies (verified) Ambien  [zolpidem  tartrate], Codeine , Amiodarone , Ativan  [lorazepam ], Gabapentin , Lyrica [pregabalin], Amitriptyline, and Seroquel  [quetiapine ]  History: Past Medical History:  Diagnosis Date   Anxiety and depression    Aortic insufficiency 03/11/2022   Echocardiogram 02/2022: EF 70-75, no RWMA, moderate LVH, normal RVSF, normal PASP (RVSP 20), moderate LAE, mild MR, moderate AI, AV sclerosis without stenosis, no aortic root or ascending aorta dilation   Arthritis    knees, c-spine // gets lumbar ESI q 6 mos   Bipolar disorder (HCC)    CAD (coronary artery disease)    [place]  08/2009 >> [facility] ([facility]) - mLAD 70, Dx 65 >> PCI:  3x15 mm Endeavor DES to mLAD and 2.5x12 mm Endeavor DES to Dx // S/p NSTEMI >> [facility] 8/12 ([facility]) - LM ok, LAD prox and mid 40; LAD stent ok, Dx stent ok, mRCA 30, mLCx 50 >> med Rx // ETT-Echo 2/17 ([facility]): Normal, EF 55-60 at rest   Chicken pox    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HCC)    Grief    History of echocardiogram    Echo 3/19: Mild concentric LVH, EF 60-65, normal wall motion, grade 1 diastolic dysfunction, mild AI, mildly dilated aortic root (40 mm), MAC, mild LAE, atrial septal lipomatous hypertrophy // Echo 8/22: EF 55-60, no RWMA, mild LVH, GRII DD, normal RVSF, RVSP 40, mild LAE, trivial MR, mild-moderate AI, AV sclerosis without stenosis, mild dilation of aortic root (39 mm)   History of non-ST elevation myocardial infarction (NSTEMI)    History of nuclear stress test    Nuclear stress test 3/19: EF 57, inferior/inferoseptal/inferolateral defect consistent with probable soft tissue attenuation (cannot exclude subendocardial scar), no ischemia, intermediate risk >> Echo 3/19 normal EF, normal wall motion   History of pneumonia 2011   History of prostatitis 1990s   Hx of adenomatous colonic polyps 06/22/2016   Hyperlipidemia    Hypertension    Migraines    prior history   PTSD (post-traumatic stress disorder)    Past Surgical History:  Procedure Laterality Date   ATRIAL FIBRILLATION ABLATION N/A 05/17/2022   Procedure: ATRIAL FIBRILLATION ABLATION;  Surgeon: Boyce Byes, MD;  Location: [facility] INVASIVE CV LAB;  Service: Cardiovascular;  Laterality: N/A;   ATRIAL FIBRILLATION ABLATION N/A 02/14/2023   Procedure: ATRIAL FIBRILLATION ABLATION;  Surgeon: Boyce Byes, MD;  Location: [facility] INVASIVE CV LAB;  Service: Cardiovascular;  Laterality: N/A;   CARDIOVERSION N/A 08/05/2021   Procedure: CARDIOVERSION;  Surgeon: Alroy Aspen Lela Purple, MD;  Location: [facility] ENDOSCOPY;  Service: Cardiovascular;  Laterality: N/A;   CARDIOVERSION N/A 07/28/2022    Procedure: CARDIOVERSION;  Surgeon: Hazle Lites, MD;  Location[facility] ENDOSCOPY;  Service: Cardiovascular;  Laterality: N/A;   CARDIOVERSION N/A 09/23/2022   Procedure: CARDIOVERSION;  Surgeon: Euell Herrlich, MD;  Location: [facility] ENDOSCOPY;  Service: Cardiovascular;  Laterality: N/A;   CERVICAL DISCECTOMY     COLONOSCOPY  03/2017   CORONARY STENT INTERVENTION N/A 06/15/2023   Procedure: CORONARY STENT INTERVENTION;  Surgeon: Arnoldo Lapping, MD;  Location: [facility] INVASIVE CV LAB;  Service: Cardiovascular;  Laterality: N/A;   ELBOW SURGERY Left    ELBOW SURGERY Right    INGUINAL HERNIA REPAIR Bilateral    1996, 1997   KNEE ARTHROSCOPY Right    KNEE CARTILAGE SURGERY Left    LEFT HEART CATH AND CORONARY ANGIOGRAPHY N/A 06/15/2023   Procedure: LEFT HEART CATH AND CORONARY ANGIOGRAPHY;  Surgeon: Arnoldo Lapping, MD;  Location: [facility] INVASIVE CV LAB;  Service: Cardiovascular;  Laterality: N/A;   NASAL SEPTUM SURGERY     STENT PLACEMENT VASCULAR ([facility] HX)  09/02/2010   TONSILLECTOMY     TOTAL KNEE ARTHROPLASTY Left 08/31/2018   Procedure: TOTAL KNEE ARTHROPLASTY;  Surgeon: Neil Balls, MD;  Location: [facility] ORS;  Service: Orthopedics;  Laterality: Left;   Family History  Problem Relation Age of Onset   Arthritis Mother    Heart disease Mother        s/p CABG   Hypertension Mother    Alzheimer's disease Mother    Heart attack Mother    Arthritis Father    Hyperlipidemia Father    Heart disease Father        s/p CABG   Stroke Father    Hypertension Father    Heart attack Father    Multiple sclerosis Sister    Diabetes Sister    Lung cancer Paternal Grandmother    Diabetes Sister    Heart attack Sister    Pulmonary embolism Sister        died   Lung cancer Maternal Aunt    Lung cancer Paternal Aunt    Depression Brother        suicide   Social History   Socioeconomic History   Marital status: Widowed    Spouse name: Not on file   Number of children: 2   Years of education: 7    Highest education level: Not on file  Occupational History   Occupation: Retired  Tobacco Use   Smoking status: Never    Passive exposure: Never   Smokeless tobacco: Never   Tobacco comments:    Never smoke 06/14/22  Vaping Use   Vaping status: Never Used  Substance and Sexual Activity   Alcohol use: Not Currently   Drug use: No   Sexual activity: Not Currently  Other Topics Concern   Not on file  Social History Narrative   Retired, widowed in 2017    1 son / 1 daughter   2 caffeinated beverages daily no alcohol or tobacco   Fun: Work out in the yard.   Denies religious beliefs effecting healthcare.    Worked at [employer] for 30 years   Social Drivers of [employer]: Low Risk  (12/26/2023)   Overall Financial Resource Strain (CARDIA)    Difficulty of Paying Living Expenses: Not hard at all  Food Insecurity: No Food Insecurity (12/26/2023)   Hunger Vital Sign    Worried About Running Out of Food in the Last Year: Never true    Ran Out of Food in the Last Year: Never true  Transportation Needs: No Transportation Needs (12/26/2023)   PRAPARE - Administrator, Civil Service (Medical): No    Lack of Transportation (Non-Medical): No  Physical Activity: Insufficiently Active (12/26/2023)   Exercise Vital Sign    Days of Exercise per Week: 5 days    Minutes of Exercise per Session: 10 min  Stress: Stress Concern Present (12/26/2023)   [employer] of Occupational Health - Occupational Stress Questionnaire    Feeling of Stress : To some extent  Social Connections: Socially Isolated (12/26/2023)   Social Connection and Isolation Panel [NHANES]    Frequency of Communication with Friends and Family: Once a week    Frequency of Social Gatherings with Friends and Family: Never    Attends Religious Services: Never    Database administrator or Organizations: No    Attends Banker Meetings: Never    Marital Status: Widowed    Tobacco  Counseling Counseling given: No Tobacco comments:  Never smoke 06/14/22    Clinical Intake:  Pre-visit preparation completed: Yes  Pain : No/denies pain     BMI - recorded: 23.57 Nutritional Status: BMI of 19-24  Normal Nutritional Risks: None Diabetes: No  Lab Results  Component Value Date   HGBA1C 4.8 01/12/2023   HGBA1C 4.6 10/12/2021   HGBA1C 4.7 12/23/2020     How often do you need to have someone help you when you read instructions, pamphlets, or other written materials from your doctor or pharmacy?: 1 - Never  Interpreter Needed?: No  Information entered by :: Kandy Orris, CMA   Activities of Daily Living     12/26/2023    2:41 PM 06/15/2023    5:49 PM  In your present state of health, do you have any difficulty performing the following activities:  Hearing? 1   Vision? 0   Difficulty concentrating or making decisions? 0   Walking or climbing stairs? 1   Dressing or bathing? 0   Doing errands, shopping? 0 0  Preparing Food and eating ? N   Using the Toilet? N   In the past six months, have you accidently leaked urine? N   Do you have problems with loss of bowel control? N   Managing your Medications? N   Managing your Finances? N   Housekeeping or managing your Housekeeping? N     Patient Care Team: Roslyn Coombe, MD as PCP - General (Internal Medicine) Arnoldo Lapping, MD as PCP - Cardiology (Cardiology) Boyce Byes, MD as PCP - Electrophysiology (Cardiology) Sherwood Donath as Physician Assistant (Cardiology) Meade Spencer, MD as Referring Physician (Orthopedic Surgery) Seldon Dago, LCSW as Counselor (Licensed Clinical Social Worker)  Indicate any recent Medical Services you may have received from other than [facility] providers in the past year (date may be approximate).     Assessment:   This is a routine wellness examination for Justin [name].  Hearing/Vision screen Hearing Screening - Comments:: Referral to an Audiology  office Vision Screening - Comments:: Denies vision concerns - wears eyeglasses for reading only   Goals Addressed               This Visit's Progress     Patient Stated (pt-stated)        Patient stated that he wants to be able to breath better due to having A. Fib and plans to do a little at a time.  Tries to stay active doing a little bit and then resting.       Depression Screen     12/26/2023    2:49 PM 07/05/2023   11:13 AM 01/12/2023   10:33 AM 12/22/2022    1:16 PM 05/13/2022    3:06 PM 02/02/2022   10:43 AM 12/23/2021    8:44 AM  PHQ 2/9 Scores  PHQ - 2 Score 3 0 0 2  5 4   PHQ- 9 Score 7  6 2  22 8   Exception Documentation     Patient refusal      Fall Risk     12/26/2023    2:43 PM 07/05/2023   11:13 AM 01/12/2023   10:32 AM 12/22/2022    1:13 PM 05/13/2022    3:06 PM  Fall Risk   Falls in the past year? 1 0 0 1 1  Number falls in past yr: 0 0 0 0 0  Comment 1      Injury with Fall? 1 0 0 1 1  Comment  hit head (backside) seen by PCP      Risk for fall due to :  No Fall Risks No Fall Risks Orthopedic patient;Impaired balance/gait Impaired balance/gait  Follow up Falls evaluation completed;Falls prevention discussed Falls evaluation completed Falls evaluation completed Education provided;Falls prevention discussed Falls evaluation completed    MEDICARE RISK AT HOME:  Medicare Risk at Home Any stairs in or around the home?: No If so, are there any without handrails?: No Home free of loose throw rugs in walkways, pet beds, electrical cords, etc?: Yes Adequate lighting in your home to reduce risk of falls?: Yes Life alert?: No Use of a cane, walker or w/c?: No Grab bars in the bathroom?: Yes Shower chair or bench in shower?: Yes Elevated toilet seat or a handicapped toilet?: Yes  TIMED UP AND GO:  Was the test performed?  No  Cognitive Function: 6CIT completed    04/20/2020    2:43 PM 10/15/2019    2:34 PM 03/26/2015    9:14 AM  MMSE - Mini Mental State Exam   Orientation to time 5 5 5   Orientation to Place 5 5 5   Registration 3 3 3   Attention/ Calculation 5 5 5   Recall 3 1 3   Language- name 2 objects 2 2 2   Language- repeat 1 0 1  Language- follow 3 step command 3 3 3   Language- read & follow direction 1 0 1  Write a sentence 1 1 1   Copy design 1 1 1   Total score 30 26 30         12/26/2023    2:47 PM 12/22/2022    1:13 PM 12/23/2021    8:40 AM  6CIT Screen  What Year? 0 points 0 points 0 points  What month? 0 points 0 points 0 points  What time? 0 points 0 points 0 points  Count back from 20 0 points 0 points 0 points  Months in reverse 0 points 0 points 0 points  Repeat phrase 0 points 0 points 0 points  Total Score 0 points 0 points 0 points    Immunizations Immunization History  Administered Date(s) Administered   Fluad Quad(high Dose 65+) 05/15/2019, 10/12/2021, 07/25/2022   Fluad Trivalent(High Dose 65+) 06/16/2023   Influenza, High Dose Seasonal PF 05/05/2016, 05/22/2018   Influenza,inj,Quad PF,6+ Mos 06/26/2015   PFIZER(Purple Top)SARS-COV-2 Vaccination 10/24/2019, 11/19/2019, 05/04/2020   Pneumococcal Conjugate-13 08/07/2015, 05/05/2016   Pneumococcal Polysaccharide-23 06/09/2016   Td 03/26/2015   Zoster Recombinant(Shingrix) 10/03/2016   Zoster, Live 03/26/2015    Screening Tests Health Maintenance  Topic Date Due   Colonoscopy  01/12/2024 (Originally 07/21/2022)   INFLUENZA VACCINE  03/22/2024   Medicare Annual Wellness (AWV)  12/25/2024   DTaP/Tdap/Td (2 - Tdap) 03/25/2025   Pneumonia Vaccine 86+ Years old  Completed   Hepatitis C Screening  Completed   HPV VACCINES  Aged Out   Meningococcal B Vaccine  Aged Out   COVID-19 Vaccine  Discontinued   Zoster Vaccines- Shingrix  Discontinued    Health Maintenance  There are no preventive care reminders to display for this patient.  Health Maintenance Items Addressed:  Referral to Audiology Clinic and [facility]  Additional Screening:  Vision  Screening: Recommended annual ophthalmology exams for early detection of glaucoma and other disorders of the eye.  Dental Screening: Recommended annual dental exams for proper oral hygiene  Community Resource Referral / Chronic Care Management: CRR required this visit?  Yes - referral to [facility]  CCM required  this visit?  No     Plan:     I have personally reviewed and noted the following in the patient's chart:   Medical and social history Use of alcohol, tobacco or illicit drugs  Current medications and supplements including opioid prescriptions. Patient is not currently taking opioid prescriptions. Functional ability and status Nutritional status Physical activity Advanced directives List of other physicians Hospitalizations, surgeries, and ER visits in previous 12 months Vitals Screenings to include cognitive, depression, and falls Referrals and appointments  In addition, I have reviewed and discussed with patient certain preventive protocols, quality metrics, and best practice recommendations. A written personalized care plan for preventive services as well as general preventive health recommendations were provided to patient.     Patria Bookbinder, CMA   12/26/2023   After Visit Summary: (MyChart) Due to this being a telephonic visit, the after visit summary with patients personalized plan was offered to patient via MyChart   Notes: Please refer to Routing Comments.

## 2023-12-26 NOTE — Patient Instructions (Signed)
 Justin [name] , Thank you for taking time to come for your Medicare Wellness Visit. I appreciate your ongoing commitment to your health goals. Please review the following plan we discussed and let me know if I can assist you in the future.   Referrals/Orders/Follow-Ups/Clinician Recommendations: Aim for 30 minutes of exercise or brisk walking, 6-8 glasses of water , and 5 servings of fruits and vegetables each day.   This is a list of the screening recommended for you and due dates:  Health Maintenance  Topic Date Due   Colon Cancer Screening  01/12/2024*   Flu Shot  03/22/2024   Medicare Annual Wellness Visit  12/25/2024   DTaP/Tdap/Td vaccine (2 - Tdap) 03/25/2025   Pneumonia Vaccine  Completed   Hepatitis C Screening  Completed   HPV Vaccine  Aged Out   Meningitis B Vaccine  Aged Out   COVID-19 Vaccine  Discontinued   Zoster (Shingles) Vaccine  Discontinued  *Topic was postponed. The date shown is not the original due date.    Advanced directives: (Provided) Advance directive discussed with you today. I have provided a copy for you to complete at home and have notarized. Once this is complete, please bring a copy in to our office so we can scan it into your chart.   Next Medicare Annual Wellness Visit scheduled for next year: Yes

## 2024-01-01 ENCOUNTER — Other Ambulatory Visit: Payer: Self-pay | Admitting: Internal Medicine

## 2024-01-08 ENCOUNTER — Telehealth: Payer: Self-pay | Admitting: Internal Medicine

## 2024-01-08 ENCOUNTER — Telehealth: Payer: Self-pay

## 2024-01-08 NOTE — Telephone Encounter (Signed)
 Copied from CRM [id number]. Topic: General - Other >> Jan 05, 2024 12:19 PM [place] J wrote: Reason for CRM: Patient has forms that were given to him by a nurse. He said that he does not remember the nurses name, but was told to call back if he didn't understand what he was filling out.  Please call patient back.

## 2024-01-08 NOTE — Telephone Encounter (Signed)
 Copied from CRM [id number]. Topic: General - Other >> Jan 08, 2024 10:33 AM Justin [name] G wrote: Reason for CRM: PT RETURN CALL ABOUT SOME FORMS HE HAS BUT NOT SURE HOW TO FILL THEM OUT. STATED SOMEONE JUST CALLED HIM. STATED PLEASE CALL HIM BACK.

## 2024-01-08 NOTE — Telephone Encounter (Signed)
 Medication sent to pharmacy, confirmed received. No further action needed.

## 2024-01-08 NOTE — Telephone Encounter (Signed)
 Copied from CRM #[id number]. Topic: Clinical - Prescription Issue >> Jan 05, 2024 12:21 PM [place] J wrote: Reason for CRM: Patient has been waiting for his ALPRAZolam  (XANAX ) 1 MG tablet to be refilled for 4 days now and would like to be updated on what's going on with his refill.

## 2024-01-08 NOTE — Telephone Encounter (Signed)
 Copied from CRM [id number]. Topic: Clinical - Medication Refill >> Jan 08, 2024 10:39 AM Jethro Morrison wrote: Medication: ALPRAZolam  (XANAX ) 1 MG tablet  Has the patient contacted their pharmacy? No (Agent: If no, request that the patient contact the pharmacy for the refill. If patient does not wish to contact the pharmacy document the reason why and proceed with request.) (Agent: If yes, when and what did the pharmacy advise?)  This is the patient's preferred pharmacy:  [facility] - [place], [place] - [address] [place] [address] Phone: [phone] Fax: [phone]   Is this the correct pharmacy for this prescription? Yes If no, delete pharmacy and type the correct one.   Has the prescription been filled recently? No  Is the patient out of the medication? No  Has the patient been seen for an appointment in the last year OR does the patient have an upcoming appointment? Yes  Can we respond through MyChart? No  Agent: Please be advised that Rx refills may take up to 3 business days. We ask that you follow-up with your pharmacy.

## 2024-01-12 NOTE — Telephone Encounter (Signed)
 Copied from CRM [id number]. Topic: General - Other >> Jan 08, 2024 10:33 AM Dyann Glaser G wrote: Reason for CRM: PT RETURN CALL ABOUT SOME FORMS HE HAS BUT NOT SURE HOW TO FILL THEM OUT. STATED SOMEONE JUST CALLED HIM. STATED PLEASE CALL HIM BACK. >> Jan 12, 2024  2:04 PM Abigail D wrote: Patient called because he was concerned about not having completed the forms for his living will/medications etc. Prior to his 5/27 appointment, notified patient that that appointment is for his cpe, he wanted to notify Dr. Autry Legions that he will complete the paper work as soon as possible, I believe this was in regards to the advance directives.

## 2024-01-16 ENCOUNTER — Ambulatory Visit: Payer: Self-pay | Admitting: Internal Medicine

## 2024-01-16 ENCOUNTER — Encounter: Payer: Self-pay | Admitting: Internal Medicine

## 2024-01-16 ENCOUNTER — Ambulatory Visit (INDEPENDENT_AMBULATORY_CARE_PROVIDER_SITE_OTHER): Admitting: Internal Medicine

## 2024-01-16 VITALS — BP 152/88 | HR 73 | Temp 98.1°F | Ht 71.0 in | Wt 171.0 lb

## 2024-01-16 DIAGNOSIS — Z0000 Encounter for general adult medical examination without abnormal findings: Secondary | ICD-10-CM | POA: Diagnosis not present

## 2024-01-16 DIAGNOSIS — E559 Vitamin D deficiency, unspecified: Secondary | ICD-10-CM | POA: Diagnosis not present

## 2024-01-16 DIAGNOSIS — E7800 Pure hypercholesterolemia, unspecified: Secondary | ICD-10-CM

## 2024-01-16 DIAGNOSIS — L609 Nail disorder, unspecified: Secondary | ICD-10-CM | POA: Diagnosis not present

## 2024-01-16 DIAGNOSIS — Z0001 Encounter for general adult medical examination with abnormal findings: Secondary | ICD-10-CM

## 2024-01-16 DIAGNOSIS — K639 Disease of intestine, unspecified: Secondary | ICD-10-CM | POA: Diagnosis not present

## 2024-01-16 DIAGNOSIS — E538 Deficiency of other specified B group vitamins: Secondary | ICD-10-CM

## 2024-01-16 DIAGNOSIS — R739 Hyperglycemia, unspecified: Secondary | ICD-10-CM | POA: Diagnosis not present

## 2024-01-16 DIAGNOSIS — I10 Essential (primary) hypertension: Secondary | ICD-10-CM

## 2024-01-16 LAB — BASIC METABOLIC PANEL WITH GFR
BUN: 14 mg/dL (ref 6–23)
CO2: 25 meq/L (ref 19–32)
Calcium: 9.7 mg/dL (ref 8.4–10.5)
Chloride: 100 meq/L (ref 96–112)
Creatinine, Ser: 0.74 mg/dL (ref 0.40–1.50)
GFR: 87.31 mL/min (ref >60.00)
Glucose, Bld: 109 mg/dL — ABNORMAL HIGH (ref 70–99)
Potassium: 4.5 meq/L (ref 3.5–5.1)
Sodium: 133 meq/L — ABNORMAL LOW (ref 135–145)

## 2024-01-16 LAB — HEPATIC FUNCTION PANEL
ALT: 22 U/L (ref 0–53)
AST: 33 U/L (ref 0–37)
Albumin: 4.5 g/dL (ref 3.5–5.2)
Alkaline Phosphatase: 59 U/L (ref 39–117)
Bilirubin, Direct: 0.1 mg/dL (ref 0.0–0.3)
Total Bilirubin: 0.6 mg/dL (ref 0.2–1.2)
Total Protein: 7.3 g/dL (ref 6.0–8.3)

## 2024-01-16 LAB — LIPID PANEL
Cholesterol: 150 mg/dL (ref 0–200)
HDL: 87.1 mg/dL (ref >39.00)
LDL Cholesterol: 52 mg/dL (ref 0–99)
NonHDL: 62.67
Total CHOL/HDL Ratio: 2
Triglycerides: 52 mg/dL (ref 0.0–149.0)
VLDL: 10.4 mg/dL (ref 0.0–40.0)

## 2024-01-16 LAB — URINALYSIS, ROUTINE W REFLEX MICROSCOPIC
Bilirubin Urine: NEGATIVE
Hgb urine dipstick: NEGATIVE
Ketones, ur: NEGATIVE
Leukocytes,Ua: NEGATIVE
Nitrite: NEGATIVE
RBC / HPF: NONE SEEN (ref 0–?)
Specific Gravity, Urine: 1.01 (ref 1.000–1.030)
Total Protein, Urine: NEGATIVE
Urine Glucose: NEGATIVE
Urobilinogen, UA: 0.2 (ref 0.0–1.0)
pH: 6.5 (ref 5.0–8.0)

## 2024-01-16 LAB — CBC WITH DIFFERENTIAL/PLATELET
Basophils Absolute: 0 10*3/uL (ref 0.0–0.1)
Basophils Relative: 0.6 % (ref 0.0–3.0)
Eosinophils Absolute: 0 10*3/uL (ref 0.0–0.7)
Eosinophils Relative: 0.3 % (ref 0.0–5.0)
HCT: 35.6 % — ABNORMAL LOW (ref 39.0–52.0)
Hemoglobin: 11.5 g/dL — ABNORMAL LOW (ref 13.0–17.0)
Lymphocytes Relative: 13.1 % (ref 12.0–46.0)
Lymphs Abs: 0.6 10*3/uL — ABNORMAL LOW (ref 0.7–4.0)
MCHC: 32.4 g/dL (ref 30.0–36.0)
MCV: 85.6 fl (ref 78.0–100.0)
Monocytes Absolute: 0.5 10*3/uL (ref 0.1–1.0)
Monocytes Relative: 10.8 % (ref 3.0–12.0)
Neutro Abs: 3.5 10*3/uL (ref 1.4–7.7)
Neutrophils Relative %: 75.2 % (ref 43.0–77.0)
Platelets: 230 10*3/uL (ref 150.0–400.0)
RBC: 4.16 Mil/uL — ABNORMAL LOW (ref 4.22–5.81)
RDW: 16.6 % — ABNORMAL HIGH (ref 11.5–15.5)
WBC: 4.6 10*3/uL (ref 4.0–10.5)

## 2024-01-16 LAB — VITAMIN D 25 HYDROXY (VIT D DEFICIENCY, FRACTURES): VITD: 26.22 ng/mL — ABNORMAL LOW (ref 30.00–100.00)

## 2024-01-16 LAB — VITAMIN B12: Vitamin B-12: 257 pg/mL (ref 211–911)

## 2024-01-16 LAB — HEMOGLOBIN A1C: Hgb A1c MFr Bld: 4.9 % (ref 4.6–6.5)

## 2024-01-16 LAB — TSH: TSH: 0.71 u[IU]/mL (ref 0.35–5.50)

## 2024-01-16 MED ORDER — DILTIAZEM HCL ER COATED BEADS 240 MG PO CP24: 240.0000 mg | ORAL_CAPSULE | Freq: Every day | ORAL | 3 refills | Status: AC

## 2024-01-16 NOTE — Progress Notes (Signed)
 The test results show that your current treatment is OK, as the tests are stable.  Please continue the same plan.  There is no other need for change of treatment or further evaluation based on these results, at this time.  thanks

## 2024-01-16 NOTE — Assessment & Plan Note (Signed)
 BP Readings from Last 3 Encounters:  01/16/24 (!) 152/88  10/18/23 122/88  09/21/23 (!) 148/74   uncontrolled, pt to increased card CD 240 mg qd

## 2024-01-16 NOTE — Assessment & Plan Note (Signed)
Age and sex appropriate education and counseling updated with regular exercise and diet Referrals for preventative services - due for colonoscopy Immunizations addressed - none needed Smoking counseling  - none needed Evidence for depression or other mood disorder - none significant Most recent labs reviewed. I have personally reviewed and have noted: 1) the patient's medical and social history 2) The patient's current medications and supplements 3) The patient's height, weight, and BMI have been recorded in the chart  

## 2024-01-16 NOTE — Assessment & Plan Note (Signed)
 Lab Results  Component Value Date   HGBA1C 4.9 01/16/2024   Stable, pt to continue current medical treatment  - diet, wt control

## 2024-01-16 NOTE — Assessment & Plan Note (Signed)
 Also for GI referral, may need colonoscopy for further evaluation

## 2024-01-16 NOTE — Assessment & Plan Note (Signed)
 Last vitamin D  Lab Results  Component Value Date   VD25OH 26.22 (L) 01/16/2024   Low, to start oral replacement

## 2024-01-16 NOTE — Assessment & Plan Note (Signed)
 Also refer podiatry

## 2024-01-16 NOTE — Assessment & Plan Note (Signed)
 Lab Results  Component Value Date   LDLCALC 52 01/16/2024   Stable, pt to continue current statin cresto 40 mg qd

## 2024-01-16 NOTE — Progress Notes (Signed)
 Patient ID: Justin [name], male   DOB: 12/10/1945, 78 y.o.   MRN: [id number]         Chief Complaint:: wellness exam and htn, nail disorder, colon wall thickening on CT x 6 mo,        HPI:  Justin [name] is a 78 y.o. 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  Struggling with nails , asks for podiatry,  Pt also had CT abd with colon wall thickening 5 cm early transverse colon, referred to GI but states not contacted.  Denies worsening reflux, abd pain, dysphagia, n/v, bowel change or blood.  Has worsening toe nails not able to care for himself.  Pt denies chest pain, increased sob or doe, wheezing, orthopnea, PND, increased LE swelling, palpitations, dizziness or syncope.   Pt denies polydipsia, polyuria, or new focal neuro s/s.      Wt Readings from Last 3 Encounters:  01/16/24 171 lb (77.6 kg)  12/26/23 169 lb (76.7 kg)  10/18/23 169 lb 3.2 oz (76.7 kg)   BP Readings from Last 3 Encounters:  01/16/24 (!) 152/88  10/18/23 122/88  09/21/23 (!) 148/74   Immunization History  Administered Date(s) Administered   Fluad Quad(high Dose 65+) 05/15/2019, 10/12/2021, 07/25/2022   Fluad Trivalent(High Dose 65+) 06/16/2023   Influenza, High Dose Seasonal PF 05/05/2016, 05/22/2018   Influenza,inj,Quad PF,6+ Mos 06/26/2015   PFIZER(Purple Top)SARS-COV-2 Vaccination 10/24/2019, 11/19/2019, 05/04/2020   Pneumococcal Conjugate-13 08/07/2015, 05/05/2016   Pneumococcal Polysaccharide-23 06/09/2016   Td 03/26/2015   Zoster Recombinant(Shingrix) 10/03/2016   Zoster, Live 03/26/2015   Health Maintenance Due  Topic Date Due   Colonoscopy  07/21/2022      Past Medical History:  Diagnosis Date   Anxiety and depression    Aortic insufficiency 03/11/2022   Echocardiogram 02/2022: EF 70-75, no RWMA, moderate LVH, normal RVSF, normal PASP (RVSP 20),  moderate LAE, mild MR, moderate AI, AV sclerosis without stenosis, no aortic root or ascending aorta dilation   Arthritis    knees, c-spine // gets lumbar ESI q 6 mos   Bipolar disorder (HCC)    CAD (coronary artery disease)    [place]  08/2009 >> [facility] ([facility]) - mLAD 70, Dx 65 >> PCI:  3x15 mm Endeavor DES to mLAD and 2.5x12 mm Endeavor DES to Dx // S/p NSTEMI >> [facility] 8/12 ([facility]) - LM ok, LAD prox and mid 40; LAD stent ok, Dx stent ok, mRCA 30, mLCx 50 >> med Rx // ETT-Echo 2/17 ([facility]): Normal, EF 55-60 at rest   Chicken pox    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HCC)    Grief    History of echocardiogram    Echo 3/19: Mild concentric LVH, EF 60-65, normal wall motion, grade 1 diastolic dysfunction, mild AI, mildly dilated aortic root (40 mm), MAC, mild LAE, atrial septal lipomatous hypertrophy // Echo 8/22: EF 55-60, no RWMA, mild LVH, GRII DD, normal RVSF, RVSP 40, mild LAE, trivial MR, mild-moderate AI, AV sclerosis without stenosis, mild dilation of aortic root (39 mm)   History of non-ST elevation myocardial infarction (NSTEMI)    History of nuclear stress test    Nuclear stress test 3/19: EF 57, inferior/inferoseptal/inferolateral defect consistent with probable soft tissue attenuation (cannot exclude subendocardial scar),  no ischemia, intermediate risk >> Echo 3/19 normal EF, normal wall motion   History of pneumonia 2011   History of prostatitis 1990s   Hx of adenomatous colonic polyps 06/22/2016   Hyperlipidemia    Hypertension    Migraines    prior history   PTSD (post-traumatic stress disorder)    Past Surgical History:  Procedure Laterality Date   ATRIAL FIBRILLATION ABLATION N/A 05/17/2022   Procedure: ATRIAL FIBRILLATION ABLATION;  Surgeon: Boyce Byes, MD;  Location: [facility] INVASIVE CV LAB;  Service: Cardiovascular;  Laterality: N/A;   ATRIAL FIBRILLATION ABLATION N/A 02/14/2023   Procedure: ATRIAL FIBRILLATION ABLATION;  Surgeon: Boyce Byes, MD;  Location: [facility]  INVASIVE CV LAB;  Service: Cardiovascular;  Laterality: N/A;   CARDIOVERSION N/A 08/05/2021   Procedure: CARDIOVERSION;  Surgeon: Alroy Aspen Lela Purple, MD;  Location: [facility] ENDOSCOPY;  Service: Cardiovascular;  Laterality: N/A;   CARDIOVERSION N/A 07/28/2022   Procedure: CARDIOVERSION;  Surgeon: Hazle Lites, MD;  Location: [facility] ENDOSCOPY;  Service: Cardiovascular;  Laterality: N/A;   CARDIOVERSION N/A 09/23/2022   Procedure: CARDIOVERSION;  Surgeon: Euell Herrlich, MD;  Location: [facility] ENDOSCOPY;  Service: Cardiovascular;  Laterality: N/A;   CERVICAL DISCECTOMY     COLONOSCOPY  03/2017   CORONARY STENT INTERVENTION N/A 06/15/2023   Procedure: CORONARY STENT INTERVENTION;  Surgeon: Arnoldo Lapping, MD;  Location: [facility] INVASIVE CV LAB;  Service: Cardiovascular;  Laterality: N/A;   ELBOW SURGERY Left    ELBOW SURGERY Right    INGUINAL HERNIA REPAIR Bilateral    1996, 1997   KNEE ARTHROSCOPY Right    KNEE CARTILAGE SURGERY Left    LEFT HEART CATH AND CORONARY ANGIOGRAPHY N/A 06/15/2023   Procedure: LEFT HEART CATH AND CORONARY ANGIOGRAPHY;  Surgeon: Arnoldo Lapping, MD;  Location: [facility] INVASIVE CV LAB;  Service: Cardiovascular;  Laterality: N/A;   NASAL SEPTUM SURGERY     STENT PLACEMENT VASCULAR ([facility] HX)  09/02/2010   TONSILLECTOMY     TOTAL KNEE ARTHROPLASTY Left 08/31/2018   Procedure: TOTAL KNEE ARTHROPLASTY;  Surgeon: Neil Balls, MD;  Location: [facility] ORS;  Service: Orthopedics;  Laterality: Left;    reports that he has never smoked. He has never been exposed to tobacco smoke. He has never used smokeless tobacco. He reports that he does not currently use alcohol. He reports that he does not use drugs. family history includes Alzheimer's disease in his mother; Arthritis in his father and mother; Depression in his brother; Diabetes in his sister and sister; Heart attack in his father, mother, and sister; Heart disease in his father and mother; Hyperlipidemia in his father; Hypertension in his father and  mother; Lung cancer in his maternal aunt, paternal aunt, and paternal grandmother; Multiple sclerosis in his sister; Pulmonary embolism in his sister; Stroke in his father. Allergies  Allergen Reactions   Ambien  [Zolpidem  Tartrate] Other (See Comments)    Blackout, memory issues   Codeine      Causes Memory issues    Amiodarone      dizziness   Ativan  [Lorazepam ] Other (See Comments)    Made him feel completely out of it fell down   Gabapentin  Other (See Comments)    Drowsy during the day   Lyrica [Pregabalin] Nausea Only   Amitriptyline Palpitations   Seroquel  [Quetiapine ]     SEVERE NIGHTMARES, SLEEPWALK AND NIGHT DRIVE WITH NO RECOLLECTION UPON WAKENING   Current Outpatient Medications on File Prior to Visit  Medication Sig Dispense Refill   ALPRAZolam  (XANAX ) 1 MG tablet TAKE  1/2 TO 1 TABLET BY MOUTH ONCE DAILY 30 tablet 2   clopidogrel  (PLAVIX ) 75 MG tablet Take 1 tablet (75 mg total) by mouth daily with breakfast. 90 tablet 3   isosorbide  mononitrate (IMDUR ) 30 MG 24 hr tablet TAKE 1 TABLET BY MOUTH DAILY 90 tablet 3   lamoTRIgine  (LAMICTAL ) 200 MG tablet Take 1 tablet (200 mg total) by mouth daily. 30 tablet 2   nitroGLYCERIN  (NITROSTAT ) 0.4 MG SL tablet Place 1 tablet (0.4 mg total) under the tongue every 5 (five) minutes as needed for chest pain. 25 tablet 3   pantoprazole  (PROTONIX ) 40 MG tablet Take 1 tablet (40 mg total) by mouth daily. 30 tablet 2   rivaroxaban  (XARELTO ) 20 MG TABS tablet Take 1 tablet (20 mg total) by mouth daily. 30 tablet 5   rosuvastatin  (CRESTOR ) 40 MG tablet Take 1 tablet (40 mg total) by mouth daily. 90 tablet 2   chlorproMAZINE  (THORAZINE ) 10 MG tablet Take 1 tablet (10 mg total) by mouth at bedtime. (Patient not taking: Reported on 01/16/2024) 30 tablet 0   cyclobenzaprine  (FLEXERIL ) 5 MG tablet Take 1 tablet (5 mg total) by mouth 3 (three) times daily as needed. (Patient not taking: Reported on 01/16/2024) 40 tablet 1   No current  facility-administered medications on file prior to visit.        ROS:  All others reviewed and negative.  Objective        PE:  BP (!) 152/88 (BP Location: Right Arm, Patient Position: Sitting, Cuff Size: Normal)   Pulse 73   Temp 98.1 F (36.7 C) (Oral)   Ht 5\' 11"  (1.803 m)   Wt 171 lb (77.6 kg)   SpO2 100%   BMI 23.85 kg/m                 Constitutional: Pt appears in NAD               HENT: Head: NCAT.                Right Ear: External ear normal.                 Left Ear: External ear normal.                Eyes: . Pupils are equal, round, and reactive to light. Conjunctivae and EOM are normal               Nose: without d/c or deformity               Neck: Neck supple. Gross normal ROM               Cardiovascular: Normal rate and regular rhythm.                 Pulmonary/Chest: Effort normal and breath sounds without rales or wheezing.                Abd:  Soft, NT, ND, + BS, no organomegaly               Neurological: Pt is alert. At baseline orientation, motor grossly intact               Skin: Skin is warm. No rashes, no other new lesions, LE edema - none               Psychiatric: Pt behavior is normal without agitation   Micro: none  Cardiac tracings I have personally interpreted  today:  none  Pertinent Radiological findings (summarize): nov 2024 CT - 4. Focal wall thickening in the proximal transverse colon measuring approximately 5 cm in length. Recommend colonoscopy to exclude neoplasm.   Lab Results  Component Value Date   WBC 4.6 01/16/2024   HGB 11.5 (L) 01/16/2024   HCT 35.6 (L) 01/16/2024   PLT 230.0 01/16/2024   GLUCOSE 109 (H) 01/16/2024   CHOL 150 01/16/2024   TRIG 52.0 01/16/2024   HDL 87.10 01/16/2024   LDLCALC 52 01/16/2024   ALT 22 01/16/2024   AST 33 01/16/2024   NA 133 (L) 01/16/2024   K 4.5 01/16/2024   CL 100 01/16/2024   CREATININE 0.74 01/16/2024   BUN 14 01/16/2024   CO2 25 01/16/2024   TSH 0.71 01/16/2024   PSA 3.38  01/12/2023   INR 2.5 (H) 07/06/2023   HGBA1C 4.9 01/16/2024   MICROALBUR <0.7 01/12/2023   Assessment/Plan:  Justin [name] is a 78 y.o. White or Caucasian [1] male with  has a past medical history of Anxiety and depression, Aortic insufficiency (03/11/2022), Arthritis, Bipolar disorder (HCC), CAD (coronary artery disease), Chicken pox, 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HCC), Grief, History of echocardiogram, History of non-ST elevation myocardial infarction (NSTEMI), History of nuclear stress test, History of pneumonia (2011), History of prostatitis (1990s), adenomatous colonic polyps (06/22/2016), Hyperlipidemia, Hypertension, Migraines, and PTSD (post-traumatic stress disorder).  Encounter for well adult exam with abnormal findings Age and sex appropriate education and counseling updated with regular exercise and diet Referrals for preventative services - due for colonoscopy Immunizations addressed - none needed Smoking counseling  - none needed Evidence for depression or other mood disorder - none significant Most recent labs reviewed. I have personally reviewed and have noted: 1) the patient's medical and social history 2) The patient's current medications and supplements 3) The patient's height, weight, and BMI have been recorded in the chart   Hypertension BP Readings from Last 3 Encounters:  01/16/24 (!) 152/88  10/18/23 122/88  09/21/23 (!) 148/74   uncontrolled, pt to increased card CD 240 mg qd   Hyperglycemia Lab Results  Component Value Date   HGBA1C 4.9 01/16/2024   Stable, pt to continue current medical treatment  - diet, wt control   Hyperlipidemia Lab Results  Component Value Date   LDLCALC 52 01/16/2024   Stable, pt to continue current statin cresto 40 mg qd   Vitamin D  deficiency Last vitamin D  Lab Results  Component Value Date   VD25OH 26.22 (L) 01/16/2024   Low, to start oral replacement   B12 deficiency Lab Results  Component Value Date   VITAMINB12  257 01/16/2024   Stable, cont oral replacement - b12 1000 mcg qd   Colon wall thickening Also for GI referral, may need colonoscopy for further evaluation  Nail disorder Also refer podiatry   Followup: Return in about 6 months (around 07/18/2024).  Rosalia Colonel, MD 01/16/2024 9:21 PM [facility]

## 2024-01-16 NOTE — Patient Instructions (Signed)
 Ok to increase the diltiazem  to 240 mg per day (a small increase only)  Please continue all other medications as before, and refills have been done if requested.  Please have the pharmacy call with any other refills you may need.  Please continue your efforts at being more active, low cholesterol diet, and weight control.  You are otherwise up to date with prevention measures today.  Please keep your appointments with your specialists as you may have planned  You will be contacted regarding the referral for: Gastroenterology, and Podiatry  Please go to the LAB at the blood drawing area for the tests to be done  You will be contacted by phone if any changes need to be made immediately.  Otherwise, you will receive a letter about your results with an explanation, but please check with MyChart first.  Please make an Appointment to return in 6 months, or sooner if needed

## 2024-01-16 NOTE — Assessment & Plan Note (Signed)
 Lab Results  Component Value Date   VITAMINB12 257 01/16/2024   Stable, cont oral replacement - b12 1000 mcg qd

## 2024-01-17 ENCOUNTER — Other Ambulatory Visit: Payer: Self-pay

## 2024-01-17 ENCOUNTER — Other Ambulatory Visit: Payer: Self-pay | Admitting: Internal Medicine

## 2024-01-18 ENCOUNTER — Ambulatory Visit: Admitting: Internal Medicine

## 2024-01-19 ENCOUNTER — Encounter (HOSPITAL_COMMUNITY): Payer: Self-pay | Admitting: Psychiatry

## 2024-01-19 ENCOUNTER — Telehealth: Payer: Self-pay | Admitting: Internal Medicine

## 2024-01-19 ENCOUNTER — Telehealth (HOSPITAL_COMMUNITY): Payer: Medicare Other | Admitting: Psychiatry

## 2024-01-19 VITALS — Wt 171.0 lb

## 2024-01-19 DIAGNOSIS — F5101 Primary insomnia: Secondary | ICD-10-CM | POA: Diagnosis not present

## 2024-01-19 DIAGNOSIS — K639 Disease of intestine, unspecified: Secondary | ICD-10-CM

## 2024-01-19 DIAGNOSIS — F4310 Post-traumatic stress disorder, unspecified: Secondary | ICD-10-CM

## 2024-01-19 DIAGNOSIS — F3162 Bipolar disorder, current episode mixed, moderate: Secondary | ICD-10-CM | POA: Diagnosis not present

## 2024-01-19 MED ORDER — LAMOTRIGINE 200 MG PO TABS: 200.0000 mg | ORAL_TABLET | Freq: Every day | ORAL | 2 refills | Status: DC

## 2024-01-19 MED ORDER — CHLORPROMAZINE HCL 10 MG PO TABS: 10.0000 mg | ORAL_TABLET | Freq: Every day | ORAL | 2 refills | Status: DC

## 2024-01-19 NOTE — Telephone Encounter (Signed)
 Copied from CRM [id number]. Topic: Referral - Question >> Jan 18, 2024  2:24 PM Justin [name] wrote: Reason for CRM: Pt stated that he had a CT scan last year and stated they found a dark spot in is colon. Pt stated that Dr.John informed him that he would send a referral for a urologist but never heard anything after that. Pt wants to know if he still needs it and if so could Dr.John send the referral.

## 2024-01-19 NOTE — Progress Notes (Signed)
 [facility] MD Virtual Progress Note   Patient Location: Home Provider Location: Home Office  I connect with patient by telephone and verified that I am speaking with correct person by using two identifiers. I discussed the limitations of evaluation and management by telemedicine and the availability of in person appointments. I also discussed with the patient that there may be a patient responsible charge related to this service. The patient expressed understanding and agreed to proceed.  Justin [name] [id number] 78 y.o.  01/19/2024 10:49 AM  History of Present Illness:  Patient is evaluated by phone session.  Again he apologized not having a video because his not sure how to login to MyChart.  He reported psychiatric medicine is working but he still have a lot of pain in his leg.  Few days ago he had to mow the grass and he get very tired.  He reported his leg pain is now worsening and he has a scheduled to see his pain doctor/orthopedic.  Denies any panic attack but chronic dysphoria and financial strain.  He Sometime irritable and upset when his son does not talk to him or disrespectful.  We have prescribed Thorazine  10 mg but he is not taking regularly even though he knows it does help sleep and nightmares.  Denies any mania, psychosis, hallucination.  He has difficulty walking and driving but he does not want to give up.  His appetite is fair.  His weight is stable.  He is taking Xanax  prescribed by PCP but overall he has cut down his Xanax  intake.  Denies any suicidal thoughts or homicidal thoughts.  He is in therapy with Camilo Cella.  Recently had a blood work and sodium slightly improved.  Past Psychiatric History: H/O suicidal attempt by overdose on Ambien  twice, hydroxyzine  and alcohol. H/O bipolar disorder with multiple inpatient.  Did IOP. Last ER visit Sept 2020 and inpatient October 2018 at [facility]. Tried Zyprexa , Cymbalta , Seroquel , Vistaril , Neurontin , Elavil, Wellbutrin and  triptal. Tried low-dose doxepin  and trazodone . Had a reaction with Ativan  and pain medication.  Saw Dr. Renetta Carter in past.    Outpatient Encounter Medications as of 01/19/2024  Medication Sig   ALPRAZolam  (XANAX ) 1 MG tablet TAKE 1/2 TO 1 TABLET BY MOUTH ONCE DAILY   chlorproMAZINE  (THORAZINE ) 10 MG tablet Take 1 tablet (10 mg total) by mouth at bedtime. (Patient not taking: Reported on 01/16/2024)   clopidogrel  (PLAVIX ) 75 MG tablet Take 1 tablet (75 mg total) by mouth daily with breakfast.   cyclobenzaprine  (FLEXERIL ) 5 MG tablet Take 1 tablet (5 mg total) by mouth 3 (three) times daily as needed. (Patient not taking: Reported on 01/16/2024)   diltiazem  (CARDIZEM  CD) 240 MG 24 hr capsule Take 1 capsule (240 mg total) by mouth daily.   isosorbide  mononitrate (IMDUR ) 30 MG 24 hr tablet TAKE 1 TABLET BY MOUTH DAILY   lamoTRIgine  (LAMICTAL ) 200 MG tablet Take 1 tablet (200 mg total) by mouth daily.   nitroGLYCERIN  (NITROSTAT ) 0.4 MG SL tablet Place 1 tablet (0.4 mg total) under the tongue every 5 (five) minutes as needed for chest pain.   pantoprazole  (PROTONIX ) 40 MG tablet TAKE 1 TABLET BY MOUTH DAILY   rivaroxaban  (XARELTO ) 20 MG TABS tablet Take 1 tablet (20 mg total) by mouth daily.   rosuvastatin  (CRESTOR ) 40 MG tablet Take 1 tablet (40 mg total) by mouth daily.   No facility-administered encounter medications on file as of 01/19/2024.    Recent Results (from the past 2160 hours)  VITAMIN D  25 Hydroxy (Vit-D Deficiency, Fractures)     Status: Abnormal   Collection Time: 01/16/24  1:51 PM  Result Value Ref Range   VITD 26.22 (L) 30.00 - 100.00 ng/mL  Vitamin B12     Status: None   Collection Time: 01/16/24  1:51 PM  Result Value Ref Range   Vitamin B-12 257 211 - 911 pg/mL  Basic metabolic panel with GFR     Status: Abnormal   Collection Time: 01/16/24  1:51 PM  Result Value Ref Range   Sodium 133 (L) 135 - 145 mEq/L   Potassium 4.5 3.5 - 5.1 mEq/L   Chloride 100 96 - 112  mEq/L   CO2 25 19 - 32 mEq/L   Glucose, Bld 109 (H) 70 - 99 mg/dL   BUN 14 6 - 23 mg/dL   Creatinine, Ser 1.61 0.40 - 1.50 mg/dL   GFR 09.60 >45.40 mL/min    Comment: Calculated using the CKD-EPI Creatinine Equation (2021)   Calcium  9.7 8.4 - 10.5 mg/dL  Urinalysis, Routine w reflex microscopic     Status: None   Collection Time: 01/16/24  1:51 PM  Result Value Ref Range   Color, Urine YELLOW Yellow;Lt. Yellow;Straw;Dark Yellow;Amber;Green;Red;Brown   APPearance CLEAR Clear;Turbid;Slightly Cloudy;Cloudy   Specific Gravity, Urine 1.010 1.000 - 1.030   pH 6.5 5.0 - 8.0   Total Protein, Urine NEGATIVE Negative   Urine Glucose NEGATIVE Negative   Ketones, ur NEGATIVE Negative   Bilirubin Urine NEGATIVE Negative   Hgb urine dipstick NEGATIVE Negative   Urobilinogen, UA 0.2 0.0 - 1.0   Leukocytes,Ua NEGATIVE Negative   Nitrite NEGATIVE Negative   WBC, UA 0-2/hpf 0-2/hpf   RBC / HPF none seen 0-2/hpf   Squamous Epithelial / HPF Rare(0-4/hpf) Rare(0-4/hpf)  TSH     Status: None   Collection Time: 01/16/24  1:51 PM  Result Value Ref Range   TSH 0.71 0.35 - 5.50 uIU/mL  CBC with Differential/Platelet     Status: Abnormal   Collection Time: 01/16/24  1:51 PM  Result Value Ref Range   WBC 4.6 4.0 - 10.5 K/uL   RBC 4.16 (L) 4.22 - 5.81 Mil/uL   Hemoglobin 11.5 (L) 13.0 - 17.0 g/dL   HCT 98.1 (L) 19.1 - 47.8 %   MCV 85.6 78.0 - 100.0 fl   MCHC 32.4 30.0 - 36.0 g/dL   RDW 29.5 (H) 62.1 - 30.8 %   Platelets 230.0 150.0 - 400.0 K/uL   Neutrophils Relative % 75.2 43.0 - 77.0 %   Lymphocytes Relative 13.1 12.0 - 46.0 %   Monocytes Relative 10.8 3.0 - 12.0 %   Eosinophils Relative 0.3 0.0 - 5.0 %   Basophils Relative 0.6 0.0 - 3.0 %   Neutro Abs 3.5 1.4 - 7.7 K/uL   Lymphs Abs 0.6 (L) 0.7 - 4.0 K/uL   Monocytes Absolute 0.5 0.1 - 1.0 K/uL   Eosinophils Absolute 0.0 0.0 - 0.7 K/uL   Basophils Absolute 0.0 0.0 - 0.1 K/uL  Hepatic function panel     Status: None   Collection Time:  01/16/24  1:51 PM  Result Value Ref Range   Total Bilirubin 0.6 0.2 - 1.2 mg/dL   Bilirubin, Direct 0.1 0.0 - 0.3 mg/dL   Alkaline Phosphatase 59 39 - 117 U/L   AST 33 0 - 37 U/L   ALT 22 0 - 53 U/L   Total Protein 7.3 6.0 - 8.3 g/dL   Albumin 4.5 3.5 - 5.2 g/dL  Lipid panel     Status: None   Collection Time: 01/16/24  1:51 PM  Result Value Ref Range   Cholesterol 150 0 - 200 mg/dL    Comment: ATP III Classification       Desirable:  < 200 mg/dL               Borderline High:  200 - 239 mg/dL          High:  > = 865 mg/dL   Triglycerides 78.4 0.0 - 149.0 mg/dL    Comment: Normal:  <696 mg/dLBorderline High:  150 - 199 mg/dL   HDL 29.52 >84.13 mg/dL   VLDL 24.4 0.0 - 01.0 mg/dL   LDL Cholesterol 52 0 - 99 mg/dL   Total CHOL/HDL Ratio 2     Comment:                Men          Women1/2 Average Risk     3.4          3.3Average Risk          5.0          4.42X Average Risk          9.6          7.13X Average Risk          15.0          11.0                       NonHDL 62.67     Comment: NOTE:  Non-HDL goal should be 30 mg/dL higher than patient's LDL goal (i.e. LDL goal of < 70 mg/dL, would have non-HDL goal of < 100 mg/dL)  Hemoglobin U7O     Status: None   Collection Time: 01/16/24  1:51 PM  Result Value Ref Range   Hgb A1c MFr Bld 4.9 4.6 - 6.5 %    Comment: Glycemic Control Guidelines for People with Diabetes:Non Diabetic:  <6%Goal of Therapy: <7%Additional Action Suggested:  >8%      Psychiatric Specialty Exam: Physical Exam  Review of Systems  Musculoskeletal:        Leg pain    Weight 171 lb (77.6 kg).There is no height or weight on file to calculate BMI.  General Appearance: NA  Eye Contact:  NA  Speech:  Slow  Volume:  Decreased  Mood:  Anxious  Affect:  NA  Thought Process:  Descriptions of Associations: Intact  Orientation:  Full (Time, Place, and Person)  Thought Content:  Rumination  Suicidal Thoughts:  No  Homicidal Thoughts:  No  Memory:  Immediate;    Good Recent;   Fair Remote;   Fair  Judgement:  Fair  Insight:  Shallow  Psychomotor Activity:  Decreased  Concentration:  Concentration: Fair and Attention Span: Fair  Recall:  [employer] of Knowledge:  Fair  Language:  Good  Akathisia:  No  Handed:  Right  AIMS (if indicated):     Assets:  Communication Skills Desire for Improvement Housing Transportation  ADL's:  Intact  Cognition:  WNL  Sleep:  fair       01/16/2024    1:04 PM 12/26/2023    2:49 PM 07/05/2023   11:13 AM 01/12/2023   10:33 AM 12/22/2022    1:16 PM  Depression screen PHQ 2/9  Decreased Interest 0 0 0 0 1  Down, Depressed, Hopeless 0 3 0  0 1  PHQ - 2 Score 0 3 0 0 2  Altered sleeping 0 0  3 0  Tired, decreased energy 0 1  0 0  Change in appetite 0 0  3 0  Feeling bad or failure about yourself  0 0  0 0  Trouble concentrating 0 0  0 0  Moving slowly or fidgety/restless 0 3  0 0  Suicidal thoughts 0 0  0 0  PHQ-9 Score 0 7  6 2   Difficult doing work/chores Not difficult at all Very difficult       Assessment/Plan: Bipolar 1 disorder, mixed, moderate (HCC) - Plan: lamoTRIgine  (LAMICTAL ) 200 MG tablet, chlorproMAZINE  (THORAZINE ) 10 MG tablet  PTSD (post-traumatic stress disorder) - Plan: lamoTRIgine  (LAMICTAL ) 200 MG tablet, chlorproMAZINE  (THORAZINE ) 10 MG tablet  Primary insomnia  Patient is 78 year old man with history of hypertension, coronary artery disease, A-fib, hyperfunction of pituitary gland, idiopathic peripheral neuropathy, chronic pain, tremors, vitamin D  deficiency, right foot pain.  I reviewed blood work results, current medication.  He is taking Lamictal  and reported no rash, itching tremors.  He reported his manic symptoms are stable but still struggle with insomnia and nightmares.  I encourage should take to Thorazine  10 mg every day to help with sleep and nightmares.  He admitted it helps sleep and flashbacks but sometimes scared to take the medication.  He admitted taking too many  medications but also got upset when he does not sleep better.  Discussed family situation as patient not happy with his relationship with his son.  Encourage to keep appointment with Camilo Cella.  Patient promised that he will take Thorazine  every night and will keep the Lamictal  200 mg daily.  Patient is on blood thinner and going to see his pain doctor for his back pain.  Encouraged to call back with any question, concern or repeat worsening of the symptoms.  Follow-up in 3 months.  I also discussed to have either in person visit or video visit.  Patient agreed.   Follow Up Instructions:     I discussed the assessment and treatment plan with the patient. The patient was provided an opportunity to ask questions and all were answered. The patient agreed with the plan and demonstrated an understanding of the instructions.   The patient was advised to call back or seek an in-person evaluation if the symptoms worsen or if the condition fails to improve as anticipated.    Collaboration of Care: Other provider involved in patient's care AEB notes are available in epic to review  Patient/Guardian was advised Release of Information must be obtained prior to any record release in order to collaborate their care with an outside provider. Patient/Guardian was advised if they have not already done so to contact the registration department to sign all necessary forms in order for us  to release information regarding their care.   Consent: Patient/Guardian gives verbal consent for treatment and assignment of benefits for services provided during this visit. Patient/Guardian expressed understanding and agreed to proceed.     Total encounter time 27 minutes which includes face-to-face time, chart reviewed, care coordination, order entry and documentation during this encounter.   Note: This document was prepared by [employer] voice dictation technology and any errors that results from this process are unintentional.     Arturo Late, MD 01/19/2024

## 2024-01-19 NOTE — Telephone Encounter (Signed)
 Hello - oh yes, we still need to have you see Gastroenterology to have this checked. Not sure why you werent called, but I will try again. thanks

## 2024-01-19 NOTE — Telephone Encounter (Signed)
 See below

## 2024-01-21 NOTE — Progress Notes (Unsigned)
 PATIENT: Justin [name] DOB: [birth year]  REASON FOR VISIT: follow up HISTORY FROM: patient  No chief complaint on file.    HISTORY OF PRESENT ILLNESS: Today 01/21/24:  Justin [name] is a 78 y.o. male with a history of paresthesias in the right foot postsurgical procedure. Returns today for follow-up.      05/16/23: Justin [name] is a 78 y.o. male with a history of paresthesias in the right foot postsurgical procedure. Returns today for follow-up.  Reports that he continues to have burning sensations around the medial portion of the right ankle.  He states that most the time his foot is numb and feels swollen.  He has tried Cymbalta , gabapentin , Lyrica and amitriptyline.  These medications either did not work or he could not tolerate them.  He would be willing to try something for the burning tingling sensation.  He understands that most medications will not be beneficial for the numbness.   Nerve conduction study with EMG Dec 22, 2022: Conclusion:   Patient's right symptomatic lower extremity shows axonal abnormalities of the right sural and right superficial peroneal sensory nerves which are consistent with the distribution of patient's sensory pain after surgery.  Patient's asymptomatic left lower extremity had normal sensory and motor conductions so there is no indication of a widespread motor or sensory polyneuropathy.    10/27/22:  (Copied from Dr.Dohmeier's note)  Justin [name] is a 78 y.o. male patient who is here for a new problem , visit 10/27/2022 for neuropathy, foot pain. He had foot surgery, after an MR revealed a " turned tendon" . Steroid shots did not help the pain. He has had surgery, has felt since as if a vice is crushing his midfoot on the right side,  had some neuropathy before on the right side? He denies this. His reports is contradictory as he stated he had tingling numbness and sometimes pain from above his ankles into his feet ( plantar, lateral aspect by his  description) before the surgery, but apparently his right foot pain has been much more intense.    His regular orthopedist was Dr. Aileen Householder, and Dr. Hulda Mage was his foot surgeon in 06-07-2021.  He was send to Dr Katheran Palms for EMG and NCS in January 2024 at [facility]; who discussed the test result with the patient: "I explained that repair of the posterior tibial tendon is not a treatment option and that tendon transfer and calcaneus osteotomy are appropriate for posterior tibial tendon tear and that he is surgical outcome appears appropriate with restoration of his arch. This was the only description of the patient's pre operative condition and surgical procedure available to me - the patient was inexact in his description and could not recall the diagnosis except for a flat foot.      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  The same is stated  for the right tibial nerve axonal motor and peripheral neuropathy.  This patient should have a sensory neuropathy given how much pain he feels, He may have small fiber neuropathy, of course.  He has absent sural nerve responses.  He has no sign of compartment syndrome and no Reflex sympathetic dystrophy sign to skin, with his toes having normal capillary refill. No edema.  The diagnosis was hereditary and idiopathic neuropathy.  But a hereditary and idiopathic neuropathy would not affect one foot only. He also does not have  asymmetric muscle mass loss, so a motor neuropathy with onset in 2022 is really not evident.  He can wiggle his toes but his foot is still flat and the dorsum of his foot is the most painful area while he describes a numbness and sometimes a tingling or burning at the bottom of the feet. He described a tight vice feeling around his foot.    Electrodiagnostic evidence of an advanced right common peroneal motor demyelinating and axonal  peripheral neuropathy.   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   Absent right sural sensory nerve conduction study; likely secondary to advanced sural peripheral neuropathy.   No electrodiagnostic evidence of a common peroneal motor entrapment neuropathy at the level of the right fibular head.   No electrodiagnostic evidence of a lumbosacral radiculopathy in the right lower extremity.   No electrodiagnostic evidence of a myopathy in the right lower extremity. [id number]      04/20/20: Justin [name] is a 78 year old male with a history of initial memory loss. He returns today for follow-up. He did not have neuropsychological testing. The order was placed to an adolescent center and it was benign. Overall though he feels that his memory has gotten better. He states that he is able to complete all ADLs independently. Whereas before he would not be able to remember if he showered. He is managing his own finances without difficulty. He states that initially he had trouble remembering payments. Reports that his sleep has improved with Valium . He continues to live at home alone. But does have family and friends that check on him frequently. He returns today for an evaluation.  HISTORY (Copied from Dr.Dohmeier's note)    Justin [name] presents today as a new patient today on October 15, 2019.  He was seen by his primary care physician on 17 December after his son insisted on an evaluation for worsening memory especially short-term memory.  He has forgotten to switch of the stove burners on the computer, he has left the current thyroid  on.  He has had several visits from his son that he cannot recall.  This seems to be quite significant cognitive impairment with amnesia.  He has never been a heavy drinker and had none in the last 6 months.  He has been followed by counselor for PTSD since the death of his wife.  Has history of coronary artery disease but  following up but never smoked.  Has a past history of migraine.  He had a vascular stent placement coronary artery disease in August 22, 2010 9 years ago, had a total knee arthroscopy and arthroplasty on August 31, 2018.  Has had elbow surgeries in the 1990s.     Please note that the patient has responded to ambien  twice  with amnestic events, performing complex behaviors that he cannot recall.  Looking at his current medication list and his follow-up psychotropic medication Cogentin , Abilify , Plavix , Lamictal  but he is also on Crestor  Nitrostat  and cardia-diltiazem .  Blood tests last obtained in September 2020 showed an elevated glucose level but I am not sure that the level was obtained fasting.  Sodium was low at 130 potassium was low at 3.4 chloride low at 97 BUN very low at 5.  Creatinine was normal.  HbA1c 4.8 TSH 0.77.   A Mini-Mental status was asked obtained in his primary care physician's office at the time he scored 30 out of 30 today he scored 27 out of 30.  He had  a brain MRI that was performed without contrast and compared to a head CT from 2011 which showed brain atrophy that was generalized and considered not age appropriate and appropriate.         REVIEW OF SYSTEMS: Out of a complete 14 system review of symptoms, the patient complains only of the following symptoms, and all other reviewed systems are negative.  See HPI ALLERGIES: Allergies  Allergen Reactions   Ambien  [Zolpidem  Tartrate] Other (See Comments)    Blackout, memory issues   Codeine      Causes Memory issues    Amiodarone      dizziness   Ativan  [Lorazepam ] Other (See Comments)    Made him feel completely out of it fell down   Gabapentin  Other (See Comments)    Drowsy during the day   Lyrica [Pregabalin] Nausea Only   Amitriptyline Palpitations   Seroquel  [Quetiapine ]     SEVERE NIGHTMARES, SLEEPWALK AND NIGHT DRIVE WITH NO RECOLLECTION UPON WAKENING    HOME MEDICATIONS: Outpatient Medications Prior to  Visit  Medication Sig Dispense Refill   ALPRAZolam  (XANAX ) 1 MG tablet TAKE 1/2 TO 1 TABLET BY MOUTH ONCE DAILY 30 tablet 2   chlorproMAZINE  (THORAZINE ) 10 MG tablet Take 1 tablet (10 mg total) by mouth at bedtime. 30 tablet 2   clopidogrel  (PLAVIX ) 75 MG tablet Take 1 tablet (75 mg total) by mouth daily with breakfast. 90 tablet 3   cyclobenzaprine  (FLEXERIL ) 5 MG tablet Take 1 tablet (5 mg total) by mouth 3 (three) times daily as needed. (Patient not taking: Reported on 01/16/2024) 40 tablet 1   diltiazem  (CARDIZEM  CD) 240 MG 24 hr capsule Take 1 capsule (240 mg total) by mouth daily. 90 capsule 3   isosorbide  mononitrate (IMDUR ) 30 MG 24 hr tablet TAKE 1 TABLET BY MOUTH DAILY 90 tablet 3   lamoTRIgine  (LAMICTAL ) 200 MG tablet Take 1 tablet (200 mg total) by mouth daily. 30 tablet 2   nitroGLYCERIN  (NITROSTAT ) 0.4 MG SL tablet Place 1 tablet (0.4 mg total) under the tongue every 5 (five) minutes as needed for chest pain. 25 tablet 3   pantoprazole  (PROTONIX ) 40 MG tablet TAKE 1 TABLET BY MOUTH DAILY 90 tablet 3   rivaroxaban  (XARELTO ) 20 MG TABS tablet Take 1 tablet (20 mg total) by mouth daily. 30 tablet 5   rosuvastatin  (CRESTOR ) 40 MG tablet Take 1 tablet (40 mg total) by mouth daily. 90 tablet 2   No facility-administered medications prior to visit.    PAST MEDICAL HISTORY: Past Medical History:  Diagnosis Date   Anxiety and depression    Aortic insufficiency 03/11/2022   Echocardiogram 02/2022: EF 70-75, no RWMA, moderate LVH, normal RVSF, normal PASP (RVSP 20), moderate LAE, mild MR, moderate AI, AV sclerosis without stenosis, no aortic root or ascending aorta dilation   Arthritis    knees, c-spine // gets lumbar ESI q 6 mos   Bipolar disorder (HCC)    CAD (coronary artery disease)    [place]  08/2009 >> LHC ([facility]) - mLAD 70, Dx 65 >> PCI:  3x15 mm Endeavor DES to mLAD and 2.5x12 mm Endeavor DES to Dx // S/p NSTEMI >> LHC 8/12 ([facility]) - LM ok, LAD prox and mid 40; LAD stent ok,  Dx stent ok, mRCA 30, mLCx 50 >> med Rx // ETT-Echo 2/17 ([facility]): Normal, EF 55-60 at rest   Chicken pox    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HCC)    Grief    History of echocardiogram  Echo 3/19: Mild concentric LVH, EF 60-65, normal wall motion, grade 1 diastolic dysfunction, mild AI, mildly dilated aortic root (40 mm), MAC, mild LAE, atrial septal lipomatous hypertrophy // Echo 8/22: EF 55-60, no RWMA, mild LVH, GRII DD, normal RVSF, RVSP 40, mild LAE, trivial MR, mild-moderate AI, AV sclerosis without stenosis, mild dilation of aortic root (39 mm)   History of non-ST elevation myocardial infarction (NSTEMI)    History of nuclear stress test    Nuclear stress test 3/19: EF 57, inferior/inferoseptal/inferolateral defect consistent with probable soft tissue attenuation (cannot exclude subendocardial scar), no ischemia, intermediate risk >> Echo 3/19 normal EF, normal wall motion   History of pneumonia 2011   History of prostatitis 1990s   Hx of adenomatous colonic polyps 06/22/2016   Hyperlipidemia    Hypertension    Migraines    prior history   PTSD (post-traumatic stress disorder)     PAST SURGICAL HISTORY: Past Surgical History:  Procedure Laterality Date   ATRIAL FIBRILLATION ABLATION N/A 05/17/2022   Procedure: ATRIAL FIBRILLATION ABLATION;  Surgeon: Boyce Byes, MD;  Location: [facility] INVASIVE CV LAB;  Service: Cardiovascular;  Laterality: N/A;   ATRIAL FIBRILLATION ABLATION N/A 02/14/2023   Procedure: ATRIAL FIBRILLATION ABLATION;  Surgeon: Boyce Byes, MD;  Location: [facility] INVASIVE CV LAB;  Service: Cardiovascular;  Laterality: N/A;   CARDIOVERSION N/A 08/05/2021   Procedure: CARDIOVERSION;  Surgeon: Alroy Aspen Lela Purple, MD;  Location: [facility] ENDOSCOPY;  Service: Cardiovascular;  Laterality: N/A;   CARDIOVERSION N/A 07/28/2022   Procedure: CARDIOVERSION;  Surgeon: Hazle Lites, MD;  Location: [facility] ENDOSCOPY;  Service: Cardiovascular;  Laterality: N/A;   CARDIOVERSION N/A 09/23/2022    Procedure: CARDIOVERSION;  Surgeon: Euell Herrlich, MD;  Location: [facility] ENDOSCOPY;  Service: Cardiovascular;  Laterality: N/A;   CERVICAL DISCECTOMY     COLONOSCOPY  03/2017   CORONARY STENT INTERVENTION N/A 06/15/2023   Procedure: CORONARY STENT INTERVENTION;  Surgeon: Arnoldo Lapping, MD;  Location: [facility] INVASIVE CV LAB;  Service: Cardiovascular;  Laterality: N/A;   ELBOW SURGERY Left    ELBOW SURGERY Right    INGUINAL HERNIA REPAIR Bilateral    1996, 1997   KNEE ARTHROSCOPY Right    KNEE CARTILAGE SURGERY Left    LEFT HEART CATH AND CORONARY ANGIOGRAPHY N/A 06/15/2023   Procedure: LEFT HEART CATH AND CORONARY ANGIOGRAPHY;  Surgeon: Arnoldo Lapping, MD;  Location: [facility] INVASIVE CV LAB;  Service: Cardiovascular;  Laterality: N/A;   NASAL SEPTUM SURGERY     STENT PLACEMENT VASCULAR ([facility] HX)  09/02/2010   TONSILLECTOMY     TOTAL KNEE ARTHROPLASTY Left 08/31/2018   Procedure: TOTAL KNEE ARTHROPLASTY;  Surgeon: Neil Balls, MD;  Location: [facility] ORS;  Service: Orthopedics;  Laterality: Left;    FAMILY HISTORY: Family History  Problem Relation Age of Onset   Arthritis Mother    Heart disease Mother        s/p CABG   Hypertension Mother    Alzheimer's disease Mother    Heart attack Mother    Arthritis Father    Hyperlipidemia Father    Heart disease Father        s/p CABG   Stroke Father    Hypertension Father    Heart attack Father    Multiple sclerosis Sister    Diabetes Sister    Lung cancer Paternal Grandmother    Diabetes Sister    Heart attack Sister    Pulmonary embolism Sister        died  Lung cancer Maternal Aunt    Lung cancer Paternal Aunt    Depression Brother        suicide    SOCIAL HISTORY: Social History   Socioeconomic History   Marital status: Widowed    Spouse name: Not on file   Number of children: 2   Years of education: 67   Highest education level: Not on file  Occupational History   Occupation: Retired  Tobacco Use   Smoking status: Never     Passive exposure: Never   Smokeless tobacco: Never   Tobacco comments:    Never smoke 06/14/22  Vaping Use   Vaping status: Never Used  Substance and Sexual Activity   Alcohol use: Not Currently   Drug use: No   Sexual activity: Not Currently  Other Topics Concern   Not on file  Social History Narrative   Retired, widowed in 2017    1 son / 1 daughter   2 caffeinated beverages daily no alcohol or tobacco   Fun: Work out in the yard.   Denies religious beliefs effecting healthcare.    Worked at [employer] for 30 years   Social Drivers of [employer]: Low Risk  (12/26/2023)   Overall Financial Resource Strain (CARDIA)    Difficulty of Paying Living Expenses: Not hard at all  Food Insecurity: No Food Insecurity (12/26/2023)   Hunger Vital Sign    Worried About Running Out of Food in the Last Year: Never true    Ran Out of Food in the Last Year: Never true  Transportation Needs: No Transportation Needs (12/26/2023)   PRAPARE - Administrator, Civil Service (Medical): No    Lack of Transportation (Non-Medical): No  Physical Activity: Insufficiently Active (12/26/2023)   Exercise Vital Sign    Days of Exercise per Week: 5 days    Minutes of Exercise per Session: 10 min  Stress: Stress Concern Present (12/26/2023)   [employer] of Occupational Health - Occupational Stress Questionnaire    Feeling of Stress : To some extent  Social Connections: Socially Isolated (12/26/2023)   Social Connection and Isolation Panel [NHANES]    Frequency of Communication with Friends and Family: Once a week    Frequency of Social Gatherings with Friends and Family: Never    Attends Religious Services: Never    Database administrator or Organizations: No    Attends Banker Meetings: Never    Marital Status: Widowed  Intimate Partner Violence: Not At Risk (12/26/2023)   Humiliation, Afraid, Rape, and Kick questionnaire    Fear of Current or Ex-Partner:  No    Emotionally Abused: No    Physically Abused: No    Sexually Abused: No      PHYSICAL EXAM  There were no vitals filed for this visit.   There is no height or weight on file to calculate BMI.  Generalized: Well developed, in no acute distress   Neurological examination  Mentation: Alert oriented to time, place, history taking. Follows all commands speech and language fluent Cranial nerve II-XII: Pupils were equal round reactive to light. Extraocular movements were full, visual field were full on confrontational test. Head turning and shoulder shrug  were normal and symmetric. Motor: The motor testing reveals 5 over 5 strength of all 4 extremities. Good symmetric motor tone is noted throughout.  Sensory: Sensory testing is intact to soft touch on all 4 extremities. No evidence of extinction is noted.  Coordination: Cerebellar testing reveals good finger-nose-finger and heel-to-shin bilaterally.  Gait and station: Gait is normal.  Reflexes: Deep tendon reflexes are symmetric and normal bilaterally.   DIAGNOSTIC DATA (LABS, IMAGING, TESTING) - I reviewed patient records, labs, notes, testing and imaging myself where available.  Lab Results  Component Value Date   WBC 4.6 01/16/2024   HGB 11.5 (L) 01/16/2024   HCT 35.6 (L) 01/16/2024   MCV 85.6 01/16/2024   PLT 230.0 01/16/2024      Component Value Date/Time   NA 133 (L) 01/16/2024 1351   NA 138 05/26/2023 1202   K 4.5 01/16/2024 1351   CL 100 01/16/2024 1351   CO2 25 01/16/2024 1351   GLUCOSE 109 (H) 01/16/2024 1351   BUN 14 01/16/2024 1351   BUN 13 05/26/2023 1202   CREATININE 0.74 01/16/2024 1351   CALCIUM  9.7 01/16/2024 1351   PROT 7.3 01/16/2024 1351   PROT 6.4 10/15/2019 1543   ALBUMIN 4.5 01/16/2024 1351   AST 33 01/16/2024 1351   ALT 22 01/16/2024 1351   ALKPHOS 59 01/16/2024 1351   BILITOT 0.6 01/16/2024 1351   GFRNONAA >60 07/14/2023 1802   GFRAA >60 08/27/2019 2039   Lab Results  Component Value  Date   CHOL 150 01/16/2024   HDL 87.10 01/16/2024   LDLCALC 52 01/16/2024   TRIG 52.0 01/16/2024   CHOLHDL 2 01/16/2024   Lab Results  Component Value Date   HGBA1C 4.9 01/16/2024   Lab Results  Component Value Date   VITAMINB12 257 01/16/2024   Lab Results  Component Value Date   TSH 0.71 01/16/2024      ASSESSMENT AND PLAN 78 y.o. year old male  has a past medical history of Anxiety and depression, Aortic insufficiency (03/11/2022), Arthritis, Bipolar disorder (HCC), CAD (coronary artery disease), Chicken pox, 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HCC), Grief, History of echocardiogram, History of non-ST elevation myocardial infarction (NSTEMI), History of nuclear stress test, History of pneumonia (2011), History of prostatitis (1990s), adenomatous colonic polyps (06/22/2016), Hyperlipidemia, Hypertension, Migraines, and PTSD (post-traumatic stress disorder). here with:  Paresthesias right foot  Patient has tried and failed amitriptyline, Cymbalta , gabapentin  and Lyrica Will trial topical cream from transdermal therapeutics.  I have given him an information sheet about transdermal therapeutics. Follow-up in 6 to 7 months or sooner if needed   Clem Currier, MSN, NP-C 01/21/2024, 1:53 PM [facility] [address] [place], [place] [address] [phone]

## 2024-01-22 ENCOUNTER — Encounter: Payer: Self-pay | Admitting: Adult Health

## 2024-01-22 ENCOUNTER — Ambulatory Visit: Payer: Medicare Other | Admitting: Adult Health

## 2024-01-22 ENCOUNTER — Telehealth: Payer: Self-pay | Admitting: Adult Health

## 2024-01-22 VITALS — BP 173/86 | HR 80 | Ht 71.0 in | Wt 173.0 lb

## 2024-01-22 DIAGNOSIS — M79671 Pain in right foot: Secondary | ICD-10-CM

## 2024-01-22 DIAGNOSIS — R202 Paresthesia of skin: Secondary | ICD-10-CM | POA: Diagnosis not present

## 2024-01-22 NOTE — Patient Instructions (Signed)
 Your Plan:  We will check blood work today. Urgent referral placed to [facility] As discussed at today's visit if your symptoms worsen or you develop new symptoms please go to urgent care or emergency department.   Thank you for coming to see us  at [facility]. I hope we have been able to provide you high quality care today.  You may receive a patient satisfaction survey over the next few weeks. We would appreciate your feedback and comments so that we may continue to improve ourselves and the health of our patients.

## 2024-01-22 NOTE — Telephone Encounter (Signed)
Referral for orthopedics fax to [facility]. Phone: [phone], Fax: [phone]

## 2024-01-23 ENCOUNTER — Ambulatory Visit (HOSPITAL_COMMUNITY): Admitting: Licensed Clinical Social Worker

## 2024-01-23 DIAGNOSIS — F3162 Bipolar disorder, current episode mixed, moderate: Secondary | ICD-10-CM

## 2024-01-24 ENCOUNTER — Encounter (HOSPITAL_COMMUNITY): Payer: Self-pay | Admitting: Licensed Clinical Social Worker

## 2024-01-24 ENCOUNTER — Telehealth: Payer: Self-pay | Admitting: Adult Health

## 2024-01-24 ENCOUNTER — Telehealth: Payer: Self-pay | Admitting: Internal Medicine

## 2024-01-24 ENCOUNTER — Ambulatory Visit: Admitting: Audiologist

## 2024-01-24 ENCOUNTER — Encounter: Payer: Self-pay | Admitting: Gastroenterology

## 2024-01-24 NOTE — Progress Notes (Addendum)
 Addendum : there is clearly a swelling throughout the right foot, discoloration and a bruising. I am concerned this could be a hardware issue a or a prolonged dormant infection, the foot is warm and the toes have normal capillary refill, palpable dorsal pedis pulse.  I doubt a neurologic origin. A neuropathy can be this painful but rarely comes with inflammation and  edema. 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  Justin [name] also has a complete loss of the arch which doesn't;t fit Friedreich.   We discussed to get imaging done ( hardware ) , see the orthopedist of his choice for another evaluation. We cannot perform NCV and EMG on the affected foot while swollen.   CD

## 2024-01-24 NOTE — Telephone Encounter (Signed)
 Urgent referral in [place] for colon wall thickening.  Last saw Dr. Willy Harvest in 2020.  Scheduled next available for 02/26/24.  Please advise urgency and if patient needs sooner appt?  Thanks

## 2024-01-24 NOTE — Telephone Encounter (Signed)
 I called the patient. [facility] offered him an appointment today at 4:00 but he couldn't make it. His appointment will now be next Thursday. I reviewed and reiterated that if his symptoms worsens he should go to urgent care immediately. He verbalized understanding. Stated that he had been outside mowing all day.

## 2024-01-24 NOTE — Progress Notes (Signed)
   THERAPIST PROGRESS NOTE  Session Time: 11:00am-11:45am  Participation Level: Active  Behavioral Response: Well GroomedAlertDepressed  Type of Therapy: Individual Therapy  Treatment Goals addressed:  LTG: Reduce frequency, intensity, and duration of depression symptoms so that daily functioning is improved   ProgressTowards Goals: Progressing  Interventions: CBT  Summary: Justin [name] is a 78 y.o. male who presents with Bipolar I, mixed.   Suicidal/Homicidal: Nowithout intent/plan  Therapist Response: Justin [name] engaged well in individual in person session with clinician. Clinician utilized CBT to process triggers, thoughts, feelings, and interactions. Justin [name] shared some frustration with his health and noted ongoing pain and trouble with foot. Clinician explored comments by his doctor and Justin [name]'s interpretation of what can and cannot be done. Clinician processed frustration and the thoughts of "to hell with it". Clinician explored SI and noted only passive thoughts at this time, but more linked to frustration than actual suicidality. Clinician explored socialization and involvement with family and friends. Justin [name] shared a reduction in communication with everyone. However, he shared some recent invitations to birthdays and graduation ceremonies for his grandchildren. Clinician reflected frustration with treatment by son's inlaws. Clinician also noted the value of showing up for his grandchildren and son, regardless of the inlaw's attitudes.   Plan: Return again in 2-4 weeks.  Diagnosis: Bipolar 1 disorder, mixed, moderate (HCC)  Collaboration of Care: Medication Management AEB Justin [name] expressed some frustration about having to come in person for a med appt. Clinician provided feedback and encouraged Justin [name] to get assistance with technology.   Patient/Guardian was advised Release of Information must be obtained prior to any record release in order to collaborate their care with an outside provider.  Patient/Guardian was advised if they have not already done so to contact the registration department to sign all necessary forms in order for us  to release information regarding their care.   Consent: Patient/Guardian gives verbal consent for treatment and assignment of benefits for services provided during this visit. Patient/Guardian expressed understanding and agreed to proceed.   Merleen Stare [place], LCSW 01/24/2024

## 2024-01-24 NOTE — Telephone Encounter (Signed)
 I have him on for 01/29/24 at 840 am with Santina Cull

## 2024-01-24 NOTE — Addendum Note (Signed)
 Addended by: Neomia Banner on: 01/24/2024 05:47 PM   Modules accepted: Orders

## 2024-01-24 NOTE — Telephone Encounter (Signed)
 Please see if we can have him seen sooner.  We can use an app in a different pod.  CT scan results IMPRESSION: 1. No acute vascular abnormality. 2. Soft tissue thickening and subcutaneous fat stranding in the posteromedial proximal left thigh extending inferiorly along the left leg into the calf. This is nonspecific but in the setting of trauma may represent contusion. 3. Edema is present within the popliteal fossa between the semimembranosus and short head of the biceps femoris muscle bellies. The attachment sites of these muscles are obscured by streak artifact. If there is concern for muscular tear, consider MRI for further evaluation. 4. Focal wall thickening in the proximal transverse colon measuring approximately 5 cm in length. Recommend colonoscopy to exclude neoplasm. 5. Stable dilation of the common bile duct measuring 14 mm. Correlate with LFTs.   Aortic Atherosclerosis (ICD10-I70.0).     Electronically Signed   By: Rozell Cornet M.D.   On: 07/14/2023 21:04

## 2024-01-24 NOTE — Progress Notes (Addendum)
 Cardiology Office Note    Patient Name: [name] Date of Encounter: 01/25/2024  Primary Care Provider:  Roslyn Coombe, MD Primary Cardiologist:  Arnoldo Lapping, MD Primary Electrophysiologist: Boyce Byes, MD   Past Medical History    Past Medical History:  Diagnosis Date   Anxiety and depression    Aortic insufficiency 03/11/2022   Echocardiogram 02/2022: EF 70-75, no RWMA, moderate LVH, normal RVSF, normal PASP (RVSP 20), moderate LAE, mild MR, moderate AI, AV sclerosis without stenosis, no aortic root or ascending aorta dilation   Arthritis    knees, c-spine // gets lumbar ESI q 6 mos   Bipolar disorder (HCC)    CAD (coronary artery disease)    [place]  08/2009 >> LHC ([facility]) - mLAD 70, Dx 65 >> PCI:  3x15 mm Endeavor DES to mLAD and 2.5x12 mm Endeavor DES to Dx // S/p NSTEMI >> LHC 8/12 ([facility]) - LM ok, LAD prox and mid 40; LAD stent ok, Dx stent ok, mRCA 30, mLCx 50 >> med Rx // ETT-Echo 2/17 ([facility]): Normal, EF 55-60 at rest   Chicken pox    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HCC)    Grief    History of echocardiogram    Echo 3/19: Mild concentric LVH, EF 60-65, normal wall motion, grade 1 diastolic dysfunction, mild AI, mildly dilated aortic root (40 mm), MAC, mild LAE, atrial septal lipomatous hypertrophy // Echo 8/22: EF 55-60, no RWMA, mild LVH, GRII DD, normal RVSF, RVSP 40, mild LAE, trivial MR, mild-moderate AI, AV sclerosis without stenosis, mild dilation of aortic root (39 mm)   History of non-ST elevation myocardial infarction (NSTEMI)    History of nuclear stress test    Nuclear stress test 3/19: EF 57, inferior/inferoseptal/inferolateral defect consistent with probable soft tissue attenuation (cannot exclude subendocardial scar), no ischemia, intermediate risk >> Echo 3/19 normal EF, normal wall motion   History of pneumonia 2011   History of prostatitis 1990s   Hx of adenomatous colonic polyps 06/22/2016   Hyperlipidemia    Hypertension    Migraines     prior history   PTSD (post-traumatic stress disorder)     History of Present Illness  Justin [name] is a 78 y.o. male with a PMH of CAD s/p unstable angina 2011 with PCI/DES to LAD and LHC 05/2023 with DES to mid LAD and mid circumflex, HTN, HLD, atrial fibrillation s/p AF ablation 04/2022 and 01/2023, disassociative amnesia, bipolar disorder, PTSD who presents today for 58-month follow-up.  [name] was seen initially in 2019 to establish care after being followed previously by Dr. Sandee Crook in [place].  He had a previous coronary history of unstable angina in 2011 with PCI/DES to LAD and subsequent NSTEMI 03/2011 with no culprit lesion.  2D echo was completed prior to preoperative clearance on 03/2021 with EF of 55 to 60% and Lexiscan  Myoview  was also completed that was normal.  He was seen as an add-on to DOD on 05/06/2021 with complaint of palpitations and intermittent chest pain.  He was found to have new onset atrial fibrillation and was started on Eliquis  and wore Zio patch to evaluate AF burden.  He was referred to the AF clinic and seen by Myrtha Ates, PA on 05/2021 after event monitor showed 100% rate controlled AF.  He underwent DCCV on 08/05/2021 and suffered early return of AF.  He was interested in speaking with Dr. Marven Slimmer and was evaluated on 02/03/2022 and elected to proceed with AF ablation which was  completed on 05/17/2022 and subsequent ablation on 02/14/2023.  He was seen in follow-up by Dr. Marven Slimmer on 05/26/2023 and reported episodes of chest pain t and underwent repeat nuclear stress test that showed no evidence of ischemia.  He reported relief of symptoms with nitroglycerin .  He was set up for outpatient LHC which was performed by Dr. Arlester Ladd on 06/15/2023.  LHC showed severe mid LAD stenosis of 80% with DES x 1 to mid left circumflex and DES x 1 to 80% mid LAD.  He was started on DAPT x 4 weeks and Plavix  x 6 months and continued on Xarelto .  He was last seen on 06/30/2023 by Dr. Arlester Ladd for  follow-up visit.  During visit he had mild pain and bruising at left radial site with vascular ultrasound showing small hematoma and no evidence of fistula or pseudoaneurysm.  He was chest pain-free and continued on current medical regimen.  [name] presents today for 32-month follow-up. His blood pressure has been fluctuating recently, with a reading of 152/83 mmHg at his primary doctor's office last week and 178/86 mmHg at a neurologist's visit earlier this week. He does not monitor his blood pressure at home due to the lack of a blood pressure cuff. His diltiazem  dosage was recently increased from 180 mg to 240 mg once daily. He has a history of atrial fibrillation and has undergone multiple procedures, including cardioversion and heart ablation. He felt like he was in atrial fibrillation this morning for a couple of hours, which he managed with a quarter piece of Xanax . He has not had frequent episodes, with the last one occurring about a month and a half ago. Caffeine, particularly coffee, may trigger his episodes. He has a history of coronary artery disease and has had two heart catheterizations with stent placements. He experienced chest pain about six to seven months ago, which was relieved by nitroglycerin . He occasionally experiences a heavy feeling in his chest, particularly after exertion, but it resolves with rest. He has not used nitroglycerin  recently. He underwent ankle surgery two years ago and has experienced persistent issues since then, including swelling, redness, and bruising. He suspects hardware issues and has been referred for imaging. 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 He has difficulty sleeping, often waking up multiple times at night. He was recently prescribed 10 mg of Thorazine  to aid sleep, but it causes vivid nightmares. He has a history of overactive brainwaves affecting his sleep quality. He lives alone and has not been observed for sleep apnea, but he  experiences daytime fatigue. He is currently taking Xarelto  and reports prolonged bleeding from minor cuts, such as a nick on his nose that bled for six to seven hours. Patient denies chest pain, palpitations, dyspnea, PND, orthopnea, nausea, vomiting, dizziness, syncope, edema, weight gain, or early satiety.  Discussed the use of AI scribe software for clinical note transcription with the patient, who gave verbal consent to proceed.  History of Present Illness   Review of Systems  Please see the history of present illness.    All other systems reviewed and are otherwise negative except as noted above.  Physical Exam     Wt Readings from Last 3 Encounters:  01/25/24 174 lb (78.9 kg)  01/22/24 173 lb (78.5 kg)  01/16/24 171 lb (77.6 kg)   VS: Vitals:   01/25/24 1453 01/25/24 1626  BP: (!) 170/90 (!) 148/82  Pulse: 79   SpO2: 98%   ,Body mass index is 24.27 kg/m. GEN:  Well nourished, well developed in no acute distress Neck: No JVD; No carotid bruits Pulmonary: Clear to auscultation without rales, wheezing or rhonchi  Cardiovascular: Normal rate. Regular rhythm. Normal S1. Normal S2.   Murmurs: There is no murmur.  ABDOMEN: Soft, non-tender, non-distended EXTREMITIES:  No edema; No deformity   EKG/LABS/ Recent Cardiac Studies   ECG personally reviewed by me today -none completed today  Risk Assessment/Calculations:    CHA2DS2-VASc Score = 4   This indicates a 4.8% annual risk of stroke. The patient's score is based upon: CHF History: 0 HTN History: 1 Diabetes History: 0 Stroke History: 0 Vascular Disease History: 1 Age Score: 2 Gender Score: 0     STOP-Bang Score:  4      Lab Results  Component Value Date   WBC 4.9 01/22/2024   HGB 11.2 (L) 01/22/2024   HCT 36.7 (L) 01/22/2024   MCV 90 01/22/2024   PLT 251 01/22/2024   Lab Results  Component Value Date   CREATININE 0.79 01/22/2024   BUN 9 01/22/2024   NA 133 (L) 01/22/2024   K 4.3 01/22/2024   CL 99  01/22/2024   CO2 19 (L) 01/22/2024   Lab Results  Component Value Date   CHOL 150 01/16/2024   HDL 87.10 01/16/2024   LDLCALC 52 01/16/2024   TRIG 52.0 01/16/2024   CHOLHDL 2 01/16/2024    Lab Results  Component Value Date   HGBA1C 5.5 01/22/2024   Assessment & Plan    Assessment and Plan Assessment & Plan   1.  History of CAD: -s/p unstable angina 2011 with PCI/DES to LAD and LHC 05/2023 with DES to mid LAD and mid circumflex -Occasional chest heaviness and mild pain with exertion. Last significant episode relieved by nitroglycerin  6 months ago.  -Symptoms suggest exertion-related angina. - Advise to use nitroglycerin  as needed and avoid overexertion. - Continue Imdur  30 mg with plan to increase if stable angina persist - Continue Crestor  40 mg daily - Patient is no longer on Plavix  75   2.  Persistent AF: -Intermittent episodes possibly triggered by caffeine. Current medication includes diltiazem  240 mg. Discussed potential triggers. - Advise to reduce caffeine intake. - Continue Xarelto  20 mg daily  3.  Essential hypertension: -HYPERTENSION CONTROL Vitals:   01/25/24 1453 01/25/24 1626  BP: (!) 170/90 (!) 148/82    The patient's blood pressure is elevated above target today.  In order to address the patient's elevated BP: Blood pressure will be monitored at home to determine if medication changes need to be made.    -Blood pressure fluctuating with recent readings of 152/83 and 178/86. Diltiazem  increased to 240 mg. Today's reading improved to 148/82. Emphasized importance of controlling blood pressure. - Order prescription for blood pressure cuff for home monitoring. - Instruct to monitor blood pressure at home and report elevated readings. - Adjust medications if home blood pressure readings remain elevated.   4.  Hyperlipidemia: - Patient's LDL cholesterol was 52 - Continue Crestor  40 mg daily  5.  Sleep disturbance: -Difficulty sleeping possibly related  to chronic pain and stress. Currently taking Thorazine  10 mg, causing vivid dreams. Discussed potential sleep apnea as a contributing factor. - Order home sleep study to evaluate for sleep apnea. - Educate on sleep study process and application setup.  6.  Preoperative clearance:  The patient affirms he has been doing well without any new cardiac symptoms. They are able to achieve 6 METS without cardiac limitations. Therefore, based on ACC/AHA  guidelines, the patient would be at acceptable risk for the planned procedure without further cardiovascular testing. The patient was advised that if he develops new symptoms prior to surgery to contact our office to arrange for a follow-up visit, and he verbalized understanding.   Please follow-up with primary cardiologist and pharmacy regarding guidance on holding [facility]   Disposition: Follow-up with Arnoldo Lapping, MD or APP in 6 months    Signed, Francene Ing, Retha Cast, NP 01/25/2024, 5:21 PM [facility]

## 2024-01-24 NOTE — Telephone Encounter (Signed)
 Nurse Practitioner, Jacqlyn Matas request call [facility] to check on referral.  Spoke with [facility],has the referral but have not reviewed. They are going to pull the referral and schedule appointment. If can not schedule will speak with the physician to see if patient can be worked in due to referral is urgent.

## 2024-01-24 NOTE — Telephone Encounter (Signed)
 Pt called  in regards to wanting to know Lab Results

## 2024-01-24 NOTE — Telephone Encounter (Signed)
 Dr Willy Harvest is the timing ok for this appt?

## 2024-01-24 NOTE — Telephone Encounter (Signed)
 Will be documented as I called the patient.  I reviewed what lab work was available which for the most part has been normal.  His CBC shows low red blood cell, hematocrit and hemoglobin.  Patient states that he still has pain in the right foot.  States that it is still swollen and red.  Does not feel that has gotten worse.  He does feel that the bruising on the toes may have gotten a little bit better.  States that he has not gotten a call from [facility]

## 2024-01-25 ENCOUNTER — Ambulatory Visit: Attending: Nurse Practitioner | Admitting: Nurse Practitioner

## 2024-01-25 ENCOUNTER — Other Ambulatory Visit (HOSPITAL_COMMUNITY): Payer: Self-pay

## 2024-01-25 ENCOUNTER — Telehealth: Payer: Self-pay | Admitting: *Deleted

## 2024-01-25 ENCOUNTER — Encounter: Payer: Self-pay | Admitting: Physician Assistant

## 2024-01-25 ENCOUNTER — Encounter: Payer: Self-pay | Admitting: Nurse Practitioner

## 2024-01-25 VITALS — BP 148/82 | HR 79 | Ht 71.0 in | Wt 174.0 lb

## 2024-01-25 DIAGNOSIS — I25119 Atherosclerotic heart disease of native coronary artery with unspecified angina pectoris: Secondary | ICD-10-CM

## 2024-01-25 DIAGNOSIS — E782 Mixed hyperlipidemia: Secondary | ICD-10-CM | POA: Diagnosis not present

## 2024-01-25 DIAGNOSIS — G479 Sleep disorder, unspecified: Secondary | ICD-10-CM

## 2024-01-25 DIAGNOSIS — I4819 Other persistent atrial fibrillation: Secondary | ICD-10-CM

## 2024-01-25 MED ORDER — BLOOD PRESSURE MONITORING DEVI: 1.0000 [IU] | Freq: Once | 0 refills | Status: AC

## 2024-01-25 MED ORDER — OMRON 3 SERIES BP MONITOR DEVI
0 refills | Status: AC
  Filled 2024-01-25: qty 1, 30d supply, fill #0

## 2024-01-25 NOTE — Telephone Encounter (Signed)
 Called and spoke with patient.  Appointment confirmed

## 2024-01-25 NOTE — Patient Instructions (Signed)
 Medication Instructions:  No changes *If you need a refill on your cardiac medications before your next appointment, please call your pharmacy*  Lab Work: No labs  Testing/Procedures: No testing  Follow-Up: At [facility], you and your health needs are our priority.  As part of our continuing mission to provide you with exceptional heart care, our providers are all part of one team.  This team includes your primary Cardiologist (physician) and Advanced Practice Providers or APPs (Physician Assistants and Nurse Practitioners) who all work together to provide you with the care you need, when you need it.  Your next appointment:   6 month(s)  Provider:   Arnoldo Lapping, MD    We recommend signing up for the patient portal called "MyChart".  Sign up information is provided on this After Visit Summary.  MyChart is used to connect with patients for Virtual Visits (Telemedicine).  Patients are able to view lab/test results, encounter notes, upcoming appointments, etc.  Non-urgent messages can be sent to your provider as well.   To learn more about what you can do with MyChart, go to [email or web address].

## 2024-01-25 NOTE — Telephone Encounter (Signed)
 Charles Connor, NP ORDERED ITAMAR STUDY.   Pt could not download app due to not remembering his PW. Pt said his son can help him when he gets home. Pt tells me that he is not tech savvy and not sure if he can do the sleep study. I did explain this to the provider, though the pt is willing to try.   I wrote a lengthy set of instructions for the pt's son Gabriel John to see if he can help his dad with the sleep study. I did put our phone# [phone] and if any questions they can call Christia Cowboy Or Laray Platt.   There is also an 800# on the box they can call.   Patient agreement reviewed and signed on 01/25/2024.  WatchPAT issued to patient on 01/25/2024 by Alec Huntington, CMA. Patient aware to not open the WatchPAT box until contacted with the activation PIN. Patient profile initialized in CloudPAT on 01/25/2024 by Alec Huntington, CMA. Device serial number: [id number]  Please list Reason for Call as Advice Only and type "WatchPAT issued to patient" in the comment box.

## 2024-01-26 ENCOUNTER — Ambulatory Visit: Payer: Self-pay | Admitting: Adult Health

## 2024-01-26 ENCOUNTER — Other Ambulatory Visit (HOSPITAL_COMMUNITY): Payer: Self-pay

## 2024-01-26 ENCOUNTER — Telehealth: Payer: Self-pay | Admitting: Adult Health

## 2024-01-26 NOTE — Telephone Encounter (Signed)
 I called the patient to check on him. He went to an urgent care near his house. He states that they couldn't do any imaging so it was recommended he go to the ED.   Then patient states that he feels that the pain in his foot is better. States that its still swollen but the reddness is a little better and the bruising at the toes is fading.   I again emphasized that if his symptoms are getting worse he should go to the ED. Reviewed symptoms of infection and other potential  signs that would be concerning. Advised that if he experienced any of those he should go to the ED. Her verbalized understanding and appreciation for my call.   I discussed with Dr. Albertina Hugger she is aware. No further inventions at this time.

## 2024-01-26 NOTE — Telephone Encounter (Signed)
 I called the patient to review his blood work. IFE was positive.  I discussed with Dr. Albertina Hugger.  This is an inflammatory marker we do not feel that it is correlated with any neurological condition that would explain his symptoms.  She is asked me to forward the results to Dr. Autry Legions his PCP as he may want to do further workup.  Also while on the phone asked the patient how his foot was.  He states that overall it is relatively the same other than it might be more swollen.  Pain is still the same.  He does feel that the bruising in the toes may be fading slightly.  But reports that his foot is still red.  He states that he may go ahead and go to urgent care since his orthopedic appointment is not until next week.  To note orthopedic did offer him appointment yesterday but he could not take it.  I advised patient certainly if he feels like his symptoms are worsening he should go to urgent care.  He voiced understanding states that he will go today.

## 2024-01-26 NOTE — Telephone Encounter (Signed)
 Called patient LVM

## 2024-01-26 NOTE — Telephone Encounter (Signed)
 Pt returned call. Please call back when available.

## 2024-01-26 NOTE — Progress Notes (Signed)
 01/29/2024 Justin [name] [id number] [birth year]  Referring provider: Roslyn Coombe, MD Primary GI doctor: Dr. Willy Harvest  ASSESSMENT AND PLAN:  Abnormal CTA 07/14/2023  with transverse colon wall thickening with personal history of TV colon polyps 07/22/2019 colonoscopy with Dr. Willy Harvest good prep 8 TV polyps 2 to 7 mm sigmoid descending transverse, diverticulosis, internal hemorrhoids, recall 06/2022 not done 06/2023 CTA Focal wall thickening in the proximal transverse colon measuring approximately 5 cm in length. Recommend colonoscopy to exclude neoplasm. Possibly from constipation, diverticulosis but must rule out malignancy with previous TV polyps Will do 2 day prep for colonoscopy with EGD for dysphagia at [facility] Will obtain cardiac clearance from Dr. Arlester Ladd to hold plavix  and xarelto , has appointment upcoming and states will have discussion to stop plavix ,  Risk of bowel prep, conscious sedation, and EGD and colonoscopy were discussed.  Risks include but are not limited to dehydration, pain, bleeding, cardiopulmonary process, bowel perforation, or other possible adverse outcomes..  Treatment plan was discussed with patient, and agreed upon.  Constipation Has had laxatives for years since wife passes 8 years ago Has been on laxatives intermittent since that time Will have BM every 2 days with laxiative, some bloating and AB pain that resolves with BM, no nausea/vomiting, no hematochezia Had weight loss over last 8 years Will do 2 day prep for colonoscopy Add on miralax  daily, information given  GERD with dysphagia progressively worse, some weight loss On pantoprazole  40 mg daily, well controlled Some issues with dysphagia, primarily pills, can happen with food has gotten progressively worse Rare beer, no smoking history  -Schedule EGD with dilatation to evaluate for stenosis, tumor, erosive/infectious esophagititis, and EOE. I discussed risks of EGD with patient today, including risk  of sedation, bleeding or perforation.  Patient provides understanding and gave verbal consent to proceed. - consider barium swallow -In the interim patient advised about swallowing precautions.  -Eat slowly, chew food well before swallowing.  -Drink liquids in between each bite to avoid food impaction. - ER precautions discussed with the patient  CAD status post PCI 06/15/2023 cardiac cath status post PCI mid LAD, mid circumflex patent LAD and diagonal stents with mild in-stent restenosis normal LVEDP Follows with Dr. Arlester Ladd Has stable angina, on imdur , has not needed NTG in last 3-4 months On Plavix  Hold Plavix  for 5 days before procedure will instruct when and how to resume after procedure.  Patient understands that there is a low but real risk of cardiovascular event such as heart attack, stroke, or embolism /  thrombosis, or ischemia while off Plavix .  The patient consents to proceed.  Will communicate by phone or EMR with patient's prescribing provider to confirm that holding Plavix  is reasonable in this case.   Atrial fibrillation Status post ablation 05/11/2022 and 02/09/2023 On Xarelto  Hold Xarelto  for 2 days before procedure  will instruct when and how to resume after procedure.  Patient understands that there is a low but real risk of cardiovascular event such as heart attack, stroke, or embolism /  thrombosis, or ischemia while off Xarelto .  The patient consents to proceed.  Will communicate by phone or EMR with patient's prescribing provider to confirm that holding Xarelto  is reasonable in this case.   Dilation of CBD 14 mm Stable , seen on CTA  Normal LFTs, no AB pain Continue to monitor, will recheck LFTs today Consider MRCP  Bipolar disorder  IgG monoclonal protein with kappa light chain With mild normocytic anemia history of  B12 def 01/22/2024  HGB 11.2 MCV 90 Platelets 251 Recent Labs    01/30/23 1110 05/26/23 1202 06/16/23 0648 07/06/23 1319 07/14/23 1802  10/18/23 1539 01/16/24 1351 01/22/24 1452  HGB 12.4* 13.0 12.8* 11.8* 10.9* 12.1* 11.5* 11.2*  - will check iron/ferritin   Patient Care Team: Roslyn Coombe, MD as PCP - General (Internal Medicine) Arnoldo Lapping, MD as PCP - Cardiology (Cardiology) Boyce Byes, MD as PCP - Electrophysiology (Cardiology) Sherwood Donath as Physician Assistant (Cardiology) Meade Spencer, MD as Referring Physician (Orthopedic Surgery) Seldon Dago, LCSW as Counselor (Licensed Clinical Social Worker)  HISTORY OF PRESENT ILLNESS: 78 y.o. male with a past medical history listed below presents for evaluation of abnormal CT.   Discussed the use of AI scribe software for clinical note transcription with the patient, who gave verbal consent to proceed.  History of Present Illness   Justin [name] "[name]" is a 78 year old male with colon polyps and cardiac issues who presents for a follow-up on gastrointestinal and cardiac health.  He has a history of colon polyps, with a colonoscopy in November 2020 revealing eight tubulovillous adenomatous polyps, internal hemorrhoids, and diverticulosis. He is due for a follow-up colonoscopy, which has not yet been completed. Recent imaging showed wall thickening in the proximal transverse colon, possibly due to diverticulosis or constipation.  He experiences chronic constipation, managed with over-the-counter laxatives, particularly since his wife's passing eight years ago. He uses Equate brand laxatives from [employer], sometimes daily, and reports abdominal discomfort and bloating if not taken. No nausea or vomiting. Constipation may be related to medication use, dietary changes, and life stressors.  He has intermittent dysphagia, with pills and sometimes food getting stuck, requiring regurgitation. This issue has been ongoing and may be related to dental issues or esophageal narrowing.  He has a history of atrial fibrillation, managed with Xarelto  and  Plavix , and has undergone multiple cardioversions and two heart ablations. In October, he experienced chest pain leading to a cardiac catheterization and placement of two stents. He rarely uses nitroglycerin , with the last use being four to six months ago. He is undergoing evaluation for sleep apnea due to poor sleep, waking up three to four times a night.  No history of smoking but occasionally drinks beer, particularly in the summer after working outside. He worked in a tobacco factory for over thirty years. He notes a family history of heart problems, with his father having died of a heart attack and his mother having Alzheimer's and also dying of a heart attack.      He  reports that he has never smoked. He has never been exposed to tobacco smoke. He has never used smokeless tobacco. He reports current alcohol use. He reports that he does not use drugs.  RELEVANT GI HISTORY, IMAGING AND LABS: Results   LABS Hb: Anemia  RADIOLOGY CT angiography: Wall thickening in the proximal transverse colon (06/2023)  DIAGNOSTIC Colonoscopy: Eight tubulovillous adenomatous polyps, 2-7 mm in size, internal hemorrhoids, diverticulosis (06/2019) Cardiac catheterization: Two stents placed (05/2023)     Colonoscopy 06/15/16 Three 3 to 8 mm polyps in the descending colon, in the transverse colon and in the ascending colon, removed with a cold snare. Resected and retrieved. - One 10 mm polyp in the sigmoid colon, removed with a hot snare. Resected and retrieved. - Severe diverticulosis in the sigmoid colon. There was narrowing of the colon in association with the diverticular opening. - The examination was otherwise  normal on direct and retroflexion views.   Path - 3 adenomas   CBC    Component Value Date/Time   WBC 4.9 01/22/2024 1452   WBC 4.6 01/16/2024 1351   RBC 4.06 (L) 01/22/2024 1452   RBC 4.16 (L) 01/16/2024 1351   HGB 11.2 (L) 01/22/2024 1452   HCT 36.7 (L) 01/22/2024 1452   PLT 251  01/22/2024 1452   MCV 90 01/22/2024 1452   MCH 27.6 01/22/2024 1452   MCH 31.4 07/14/2023 1802   MCHC 30.5 (L) 01/22/2024 1452   MCHC 32.4 01/16/2024 1351   RDW 13.5 01/22/2024 1452   LYMPHSABS 0.8 01/22/2024 1452   MONOABS 0.5 01/16/2024 1351   EOSABS 0.0 01/22/2024 1452   BASOSABS 0.0 01/22/2024 1452   Recent Labs    01/30/23 1110 05/26/23 1202 06/16/23 0648 07/06/23 1319 07/14/23 1802 10/18/23 1539 01/16/24 1351 01/22/24 1452  HGB 12.4* 13.0 12.8* 11.8* 10.9* 12.1* 11.5* 11.2*    CMP     Component Value Date/Time   NA 133 (L) 01/22/2024 1452   K 4.3 01/22/2024 1452   CL 99 01/22/2024 1452   CO2 19 (L) 01/22/2024 1452   GLUCOSE 89 01/22/2024 1452   GLUCOSE 109 (H) 01/16/2024 1351   BUN 9 01/22/2024 1452   CREATININE 0.79 01/22/2024 1452   CALCIUM  9.3 01/22/2024 1452   PROT 7.2 01/22/2024 1452   ALBUMIN 4.5 01/22/2024 1452   AST 38 01/22/2024 1452   ALT 20 01/22/2024 1452   ALKPHOS 82 01/22/2024 1452   BILITOT 0.8 01/22/2024 1452   GFRNONAA >60 07/14/2023 1802   GFRAA >60 08/27/2019 2039      Latest Ref Rng & Units 01/22/2024    2:52 PM 01/16/2024    1:51 PM 10/18/2023    3:39 PM  Hepatic Function  Total Protein 6.0 - 8.5 g/dL 7.2  7.3  6.7   Albumin 3.8 - 4.8 g/dL 4.5  4.5  3.9   AST 0 - 40 IU/L 38  33  35   ALT 0 - 44 IU/L 20  22  22    Alk Phosphatase 44 - 121 IU/L 82  59  56   Total Bilirubin 0.0 - 1.2 mg/dL 0.8  0.6  0.5   Bilirubin, Direct 0.0 - 0.3 mg/dL  0.1        Current Medications:    Current Outpatient Medications (Cardiovascular):    diltiazem  (CARDIZEM  CD) 240 MG 24 hr capsule, Take 1 capsule (240 mg total) by mouth daily.   isosorbide  mononitrate (IMDUR ) 30 MG 24 hr tablet, TAKE 1 TABLET BY MOUTH DAILY   nitroGLYCERIN  (NITROSTAT ) 0.4 MG SL tablet, Place 1 tablet (0.4 mg total) under the tongue every 5 (five) minutes as needed for chest pain.   rosuvastatin  (CRESTOR ) 40 MG tablet, Take 1 tablet (40 mg total) by mouth  daily.    Current Outpatient Medications (Hematological):    clopidogrel  (PLAVIX ) 75 MG tablet, Take 1 tablet (75 mg total) by mouth daily with breakfast.   rivaroxaban  (XARELTO ) 20 MG TABS tablet, Take 1 tablet (20 mg total) by mouth daily.  Current Outpatient Medications (Other):    ALPRAZolam  (XANAX ) 1 MG tablet, TAKE 1/2 TO 1 TABLET BY MOUTH ONCE DAILY   Blood Pressure Monitoring (OMRON 3 SERIES BP MONITOR) DEVI, Use as directed to monitor blood pressure.   chlorproMAZINE  (THORAZINE ) 10 MG tablet, Take 1 tablet (10 mg total) by mouth at bedtime.   lamoTRIgine  (LAMICTAL ) 200 MG tablet, Take 1 tablet (200 mg  total) by mouth daily.   Na Sulfate-K Sulfate-Mg Sulfate concentrate (SUPREP BOWEL PREP KIT) 17.5-3.13-1.6 GM/177ML SOLN, Take 1 kit (354 mLs total) by mouth as directed.   pantoprazole  (PROTONIX ) 40 MG tablet, TAKE 1 TABLET BY MOUTH DAILY  Medical History:  Past Medical History:  Diagnosis Date   Anxiety and depression    Aortic insufficiency 03/11/2022   Echocardiogram 02/2022: EF 70-75, no RWMA, moderate LVH, normal RVSF, normal PASP (RVSP 20), moderate LAE, mild MR, moderate AI, AV sclerosis without stenosis, no aortic root or ascending aorta dilation   Arthritis    knees, c-spine // gets lumbar ESI q 6 mos   Bipolar disorder (HCC)    CAD (coronary artery disease)    [place]  08/2009 >> LHC ([facility]) - mLAD 70, Dx 65 >> PCI:  3x15 mm Endeavor DES to mLAD and 2.5x12 mm Endeavor DES to Dx // S/p NSTEMI >> LHC 8/12 ([facility]) - LM ok, LAD prox and mid 40; LAD stent ok, Dx stent ok, mRCA 30, mLCx 50 >> med Rx // ETT-Echo 2/17 ([facility]): Normal, EF 55-60 at rest   Chicken pox    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HCC)    Grief    History of echocardiogram    Echo 3/19: Mild concentric LVH, EF 60-65, normal wall motion, grade 1 diastolic dysfunction, mild AI, mildly dilated aortic root (40 mm), MAC, mild LAE, atrial septal lipomatous hypertrophy // Echo 8/22: EF 55-60, no RWMA, mild LVH,  GRII DD, normal RVSF, RVSP 40, mild LAE, trivial MR, mild-moderate AI, AV sclerosis without stenosis, mild dilation of aortic root (39 mm)   History of non-ST elevation myocardial infarction (NSTEMI)    History of nuclear stress test    Nuclear stress test 3/19: EF 57, inferior/inferoseptal/inferolateral defect consistent with probable soft tissue attenuation (cannot exclude subendocardial scar), no ischemia, intermediate risk >> Echo 3/19 normal EF, normal wall motion   History of pneumonia 2011   History of prostatitis 1990s   Hx of adenomatous colonic polyps 06/22/2016   Hyperlipidemia    Hypertension    Migraines    prior history   PTSD (post-traumatic stress disorder)    Allergies:  Allergies  Allergen Reactions   Ambien  [Zolpidem  Tartrate] Other (See Comments)    Blackout, memory issues   Codeine      Causes Memory issues    Amiodarone      dizziness   Ativan  [Lorazepam ] Other (See Comments)    Made him feel completely out of it fell down   Gabapentin  Other (See Comments)    Drowsy during the day   Lyrica [Pregabalin] Nausea Only   Amitriptyline Palpitations   Seroquel  [Quetiapine ]     SEVERE NIGHTMARES, SLEEPWALK AND NIGHT DRIVE WITH NO RECOLLECTION UPON WAKENING     Surgical History:  He  has a past surgical history that includes Nasal septum surgery; Elbow surgery (Left); Inguinal hernia repair (Bilateral); Tonsillectomy; Knee arthroscopy (Right); Knee cartilage surgery (Left); Elbow surgery (Right); Cervical discectomy; STENT PLACEMENT VASCULAR ([facility]) (09/02/2010); Colonoscopy (03/2017); Total knee arthroplasty (Left, 08/31/2018); Cardioversion (N/A, 08/05/2021); ATRIAL FIBRILLATION ABLATION (N/A, 05/17/2022); Cardioversion (N/A, 07/28/2022); Cardioversion (N/A, 09/23/2022); ATRIAL FIBRILLATION ABLATION (N/A, 02/14/2023); LEFT HEART CATH AND CORONARY ANGIOGRAPHY (N/A, 06/15/2023); CORONARY STENT INTERVENTION (N/A, 06/15/2023); and Cataract extraction (Bilateral). Family  History:  His family history includes Alzheimer's disease in his mother; Arthritis in his father and mother; Depression in his brother; Diabetes in his sister and sister; Heart attack in his father, mother, and sister; Heart disease in his  father and mother; Hyperlipidemia in his father; Hypertension in his father and mother; Lung cancer in his maternal aunt, paternal aunt, and paternal grandmother; Multiple sclerosis in his sister; Pulmonary embolism in his sister; Stroke in his father.  REVIEW OF SYSTEMS  : All other systems reviewed and negative except where noted in the History of Present Illness.  PHYSICAL EXAM: BP (!) 150/78   Pulse 77   Ht 5\' 11"  (1.803 m)   Wt 172 lb 6.4 oz (78.2 kg)   BMI 24.04 kg/m  Physical Exam   GENERAL APPEARANCE: Well nourished, in no apparent distress. HEENT: No cervical lymphadenopathy, unremarkable thyroid , sclerae anicteric, conjunctiva pink. RESPIRATORY: Respiratory effort normal, breath sounds clear to auscultation bilaterally without rales, rhonchi, or wheezing. CARDIO: Regular rate and rhythm with no murmurs, rubs, or gallops, peripheral pulses intact. ABDOMEN: Soft, non-distended, active bowel sounds in all four quadrants, mild epigastric and peri-umbilical tenderness, small non-tender umbilical hernia, no rebound tenderness, no mass appreciated. RECTAL: Declines. MUSCULOSKELETAL: Full range of motion, normal gait, without edema. SKIN: Dry, intact without rashes or lesions. No jaundice. NEURO: Alert, oriented, no focal deficits. PSYCH: Cooperative, normal mood and affect.      Edmonia Gottron, PA-C 9:47 AM

## 2024-01-29 ENCOUNTER — Encounter: Payer: Self-pay | Admitting: Physician Assistant

## 2024-01-29 ENCOUNTER — Telehealth: Payer: Self-pay

## 2024-01-29 ENCOUNTER — Ambulatory Visit: Admitting: Physician Assistant

## 2024-01-29 ENCOUNTER — Ambulatory Visit: Attending: Internal Medicine | Admitting: Audiologist

## 2024-01-29 ENCOUNTER — Other Ambulatory Visit

## 2024-01-29 DIAGNOSIS — R634 Abnormal weight loss: Secondary | ICD-10-CM

## 2024-01-29 DIAGNOSIS — Z955 Presence of coronary angioplasty implant and graft: Secondary | ICD-10-CM

## 2024-01-29 DIAGNOSIS — D649 Anemia, unspecified: Secondary | ICD-10-CM | POA: Diagnosis not present

## 2024-01-29 DIAGNOSIS — R1310 Dysphagia, unspecified: Secondary | ICD-10-CM

## 2024-01-29 DIAGNOSIS — H903 Sensorineural hearing loss, bilateral: Secondary | ICD-10-CM | POA: Insufficient documentation

## 2024-01-29 DIAGNOSIS — Z860100 Personal history of colon polyps, unspecified: Secondary | ICD-10-CM

## 2024-01-29 DIAGNOSIS — I25119 Atherosclerotic heart disease of native coronary artery with unspecified angina pectoris: Secondary | ICD-10-CM

## 2024-01-29 DIAGNOSIS — K5900 Constipation, unspecified: Secondary | ICD-10-CM | POA: Diagnosis not present

## 2024-01-29 DIAGNOSIS — R935 Abnormal findings on diagnostic imaging of other abdominal regions, including retroperitoneum: Secondary | ICD-10-CM

## 2024-01-29 DIAGNOSIS — Z860101 Personal history of adenomatous and serrated colon polyps: Secondary | ICD-10-CM

## 2024-01-29 DIAGNOSIS — R1319 Other dysphagia: Secondary | ICD-10-CM

## 2024-01-29 DIAGNOSIS — K219 Gastro-esophageal reflux disease without esophagitis: Secondary | ICD-10-CM | POA: Diagnosis not present

## 2024-01-29 DIAGNOSIS — K639 Disease of intestine, unspecified: Secondary | ICD-10-CM

## 2024-01-29 DIAGNOSIS — I2510 Atherosclerotic heart disease of native coronary artery without angina pectoris: Secondary | ICD-10-CM

## 2024-01-29 DIAGNOSIS — I4891 Unspecified atrial fibrillation: Secondary | ICD-10-CM

## 2024-01-29 DIAGNOSIS — I4811 Longstanding persistent atrial fibrillation: Secondary | ICD-10-CM

## 2024-01-29 LAB — COMPREHENSIVE METABOLIC PANEL WITH GFR
ALT: 16 U/L (ref 0–53)
AST: 29 U/L (ref 0–37)
Albumin: 4.3 g/dL (ref 3.5–5.2)
Alkaline Phosphatase: 55 U/L (ref 39–117)
BUN: 9 mg/dL (ref 6–23)
CO2: 25 meq/L (ref 19–32)
Calcium: 9.1 mg/dL (ref 8.4–10.5)
Chloride: 102 meq/L (ref 96–112)
Creatinine, Ser: 0.76 mg/dL (ref 0.40–1.50)
GFR: 86.58 mL/min (ref >60.00)
Glucose, Bld: 117 mg/dL — ABNORMAL HIGH (ref 70–99)
Potassium: 3.9 meq/L (ref 3.5–5.1)
Sodium: 134 meq/L — ABNORMAL LOW (ref 135–145)
Total Bilirubin: 0.6 mg/dL (ref 0.2–1.2)
Total Protein: 7 g/dL (ref 6.0–8.3)

## 2024-01-29 LAB — CBC WITH DIFFERENTIAL/PLATELET
Basophils Absolute: 0 10*3/uL (ref 0.0–0.1)
Basophils Relative: 1.1 % (ref 0.0–3.0)
Eosinophils Absolute: 0.1 10*3/uL (ref 0.0–0.7)
Eosinophils Relative: 1.4 % (ref 0.0–5.0)
HCT: 33.3 % — ABNORMAL LOW (ref 39.0–52.0)
Hemoglobin: 10.9 g/dL — ABNORMAL LOW (ref 13.0–17.0)
Lymphocytes Relative: 14.3 % (ref 12.0–46.0)
Lymphs Abs: 0.6 10*3/uL — ABNORMAL LOW (ref 0.7–4.0)
MCHC: 32.8 g/dL (ref 30.0–36.0)
MCV: 85.3 fl (ref 78.0–100.0)
Monocytes Absolute: 0.4 10*3/uL (ref 0.1–1.0)
Monocytes Relative: 10.6 % (ref 3.0–12.0)
Neutro Abs: 2.9 10*3/uL (ref 1.4–7.7)
Neutrophils Relative %: 72.6 % (ref 43.0–77.0)
Platelets: 265 10*3/uL (ref 150.0–400.0)
RBC: 3.9 Mil/uL — ABNORMAL LOW (ref 4.22–5.81)
RDW: 16.5 % — ABNORMAL HIGH (ref 11.5–15.5)
WBC: 4 10*3/uL (ref 4.0–10.5)

## 2024-01-29 MED ORDER — NA SULFATE-K SULFATE-MG SULF 17.5-3.13-1.6 GM/177ML PO SOLN: 1.0000 | ORAL | 0 refills | Status: DC

## 2024-01-29 NOTE — Patient Instructions (Addendum)
 _______________________________________________________  If your blood pressure at your visit was 140/90 or greater, please contact your primary care physician to follow up on this. _______________________________________________________  If you are age 78 or older, your body mass index should be between 23-30. Your Body mass index is 24.04 kg/m. If this is out of the aforementioned range listed, please consider follow up with your Primary Care Provider. ________________________________________________________  The [facility] providers would like to encourage you to use MYCHART to communicate with providers for non-urgent requests or questions.  Due to long hold times on the telephone, sending your provider a message by [facility] may be a faster and more efficient way to get a response.  Please allow 48 business hours for a response.  Please remember that this is for non-urgent requests.  _______________________________________________________  Your provider has requested that you go to the basement level for lab work before leaving today. Press "B" on the elevator. The lab is located at the first door on the left as you exit the elevator.  You have been scheduled for an endoscopy and colonoscopy. Please follow the written instructions given to you at your visit today.  If you use inhalers (even only as needed), please bring them with you on the day of your procedure.  DO NOT TAKE 7 DAYS PRIOR TO TEST- Trulicity (dulaglutide) Ozempic, Wegovy (semaglutide) Mounjaro (tirzepatide) Bydureon Bcise (exanatide extended release)  DO NOT TAKE 1 DAY PRIOR TO YOUR TEST Rybelsus (semaglutide) Adlyxin (lixisenatide) Victoza (liraglutide) Byetta (exanatide) ___________________________________________________________________________  Due to recent changes in healthcare laws, you may see the results of your imaging and laboratory studies on MyChart before your provider has had a chance to review them.   We understand that in some cases there may be results that are confusing or concerning to you. Not all laboratory results come back in the same time frame and the provider may be waiting for multiple results in order to interpret others.  Please give us  48 hours in order for your provider to thoroughly review all the results before contacting the office for clarification of your results.    Miralax  is an osmotic laxative.  It only brings more water  into the stool.  This is safe to take daily.  Can take up to 17 gram of miralax  twice a day.  Mix with juice or coffee.  Start 1 capful at night for 3-4 days and reassess your response in 3-4 days.  You can increase and decrease the dose based on your response.  Remember, it can take up to 3-4 days to take effect OR for the effects to wear off.   I often pair this with benefiber in the morning to help assure the stool is not too loose.   Dysphagia precautions:  1. Take reflux medications 30+ minutes before food in the morning 2. Begin meals with warm beverage 3. Eat smaller more frequent meals 4. Eat slowly, taking small bites and sips 5. Alternate solids and liquids 6. Avoid foods/liquids that increase acid production 7. Sit upright during and for 30+ minutes after meals to facilitate esophageal clearing 8. All meats should be chopped finely.   If something gets hung in your esophagus and will not come up or go down, proceed to the emergency room.    Thank you for entrusting me with your care and choosing [facility].  Santina Cull, PA-C

## 2024-01-29 NOTE — Procedures (Signed)
  Outpatient Audiology and [facility] [address] [place], [place]  [address] [phone]  AUDIOLOGICAL  EVALUATION  NAME: Justin [name]     DOB:   Mar 16, 1946      MRN: [id number]                                                                                     DATE: 01/29/2024     REFERENT: Roslyn Coombe, MD STATUS: Outpatient DIAGNOSIS: Sensorineural Hearing Loss   History: Justin [name] was seen for an audiological evaluation due to difficulty hearing. 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 When he retired he was told his left ear had noise related hearing damage. That was around twenty years ago. He notices now he cannot people on the phone. He cannot hear when people turn away from him. Justin [name] denies pain, pressure, or tinnitus.  Shi significant history of hazardous occupational noise exposure.  Medical history shows cognitive decreases which is a risk for hearing loss.    Evaluation:  Otoscopy showed a clear view of the tympanic membranes, bilaterally Tympanometry results were consistent with normal middle ear function, bilaterally   Audiometric testing was completed using Conventional Audiometry techniques with insert earphones and supraural headphones. Test results are consistent with normal hearing 250-500Hz  dropping to severe hearing loss 2-8kHz bilaterally. Speech Recognition Thresholds were obtained at 45 dB HL in the right ear and at 35dB HL in the left ear. Word Recognition Testing was completed at  40dB SL and Justin [name] scored 76% in the right ear and 68% in the left ear.    Results:  The test results were reviewed with Justin [name]. 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 He cannot hear high pitched consonants such as /ch/ /s/ and /f/. He needs to read lips to hear clearly. He needs hearing aids. Audiogram printed and provided to Justin [name]. He was given a handout on how to pursue aids with  [employer] as well.  Recommendations: Hearing aids recommended for both ears. Patient given list of local hearing aid providers.  Annual audiometric testing recommended to monitor hearing loss for progression.     32 minutes spent testing and counseling on results.   If you have any questions please feel free to contact me at [phone].  Raynald Calkins Stalnaker Au.D.  Audiologist   01/29/2024  10:48 AM  Cc: Roslyn Coombe, MD

## 2024-01-29 NOTE — Telephone Encounter (Signed)
 [facility] Pre-operative Risk Assessment     Request for surgical clearance:     Endoscopy Procedure  What type of surgery is being performed?     EGD and Colon  When is this surgery scheduled?     04-12-24  What type of clearance is required ?   Pharmacy and Medical Clearance  Are there any medications that need to be held prior to surgery and how long? Plavix  x 5 days and Xarelto  x 2 days  Practice name and name of physician performing surgery?      [facility]  What is your office phone and fax number?      Phone- [phone]  Fax- [phone]  Anesthesia type (None, local, MAC, general) ?       MAC   Please route your response to Angelene Barbone

## 2024-01-29 NOTE — Addendum Note (Signed)
 Addended by: Ritchie Cheshire on: 01/29/2024 01:18 PM   Modules accepted: Orders

## 2024-01-29 NOTE — Telephone Encounter (Signed)
 I called the patient to check on him today.  He states that his foot is still swollen but the stinging that was on the top of his foot has gotten better.  The bruising has also gotten better.  He plans to still attend appointment this Thursday with orthopedics.

## 2024-01-30 LAB — COMPREHENSIVE METABOLIC PANEL WITH GFR
ALT: 20 IU/L (ref 0–44)
AST: 38 IU/L (ref 0–40)
Albumin: 4.5 g/dL (ref 3.8–4.8)
Alkaline Phosphatase: 82 IU/L (ref 44–121)
BUN/Creatinine Ratio: 11 (ref 10–24)
BUN: 9 mg/dL (ref 8–27)
Bilirubin Total: 0.8 mg/dL (ref 0.0–1.2)
CO2: 19 mmol/L — ABNORMAL LOW (ref 20–29)
Calcium: 9.3 mg/dL (ref 8.6–10.2)
Chloride: 99 mmol/L (ref 96–106)
Creatinine, Ser: 0.79 mg/dL (ref 0.76–1.27)
Globulin, Total: 2.7 g/dL (ref 1.5–4.5)
Glucose: 89 mg/dL (ref 70–99)
Potassium: 4.3 mmol/L (ref 3.5–5.2)
Sodium: 133 mmol/L — ABNORMAL LOW (ref 134–144)
Total Protein: 7.2 g/dL (ref 6.0–8.5)
eGFR: 91 mL/min/{1.73_m2} (ref >59)

## 2024-01-30 LAB — MULTIPLE MYELOMA PANEL, SERUM
Albumin SerPl Elph-Mcnc: 4.4 g/dL (ref 2.9–4.4)
Albumin/Glob SerPl: 1.6 (ref 0.7–1.7)
Alpha 1: 0.2 g/dL (ref 0.0–0.4)
Alpha2 Glob SerPl Elph-Mcnc: 0.6 g/dL (ref 0.4–1.0)
B-Globulin SerPl Elph-Mcnc: 0.9 g/dL (ref 0.7–1.3)
Gamma Glob SerPl Elph-Mcnc: 1 g/dL (ref 0.4–1.8)
Globulin, Total: 2.8 g/dL (ref 2.2–3.9)
IgA/Immunoglobulin A, Serum: 189 mg/dL (ref 61–437)
IgG (Immunoglobin G), Serum: 1125 mg/dL (ref 603–1613)
IgM (Immunoglobulin M), Srm: 58 mg/dL (ref 15–143)

## 2024-01-30 LAB — CBC WITH DIFFERENTIAL/PLATELET
Basophils Absolute: 0 10*3/uL (ref 0.0–0.2)
Basos: 1 %
EOS (ABSOLUTE): 0 10*3/uL (ref 0.0–0.4)
Eos: 1 %
Hematocrit: 36.7 % — ABNORMAL LOW (ref 37.5–51.0)
Hemoglobin: 11.2 g/dL — ABNORMAL LOW (ref 13.0–17.7)
Immature Grans (Abs): 0 10*3/uL (ref 0.0–0.1)
Immature Granulocytes: 0 %
Lymphocytes Absolute: 0.8 10*3/uL (ref 0.7–3.1)
Lymphs: 16 %
MCH: 27.6 pg (ref 26.6–33.0)
MCHC: 30.5 g/dL — ABNORMAL LOW (ref 31.5–35.7)
MCV: 90 fL (ref 79–97)
Monocytes Absolute: 0.5 10*3/uL (ref 0.1–0.9)
Monocytes: 11 %
Neutrophils Absolute: 3.5 10*3/uL (ref 1.4–7.0)
Neutrophils: 71 %
Platelets: 251 10*3/uL (ref 150–450)
RBC: 4.06 x10E6/uL — ABNORMAL LOW (ref 4.14–5.80)
RDW: 13.5 % (ref 11.6–15.4)
WBC: 4.9 10*3/uL (ref 3.4–10.8)

## 2024-01-30 LAB — HEMOGLOBIN A1C
Est. average glucose Bld gHb Est-mCnc: 111 mg/dL
Hgb A1c MFr Bld: 5.5 % (ref 4.8–5.6)

## 2024-01-30 LAB — ANA W/REFLEX: ANA Titer 1: NEGATIVE

## 2024-01-30 LAB — SEDIMENTATION RATE: Sed Rate: 8 mm/h (ref 0–30)

## 2024-01-30 LAB — C-REACTIVE PROTEIN: CRP: 3 mg/L (ref 0–10)

## 2024-01-30 LAB — METHYLMALONIC ACID, SERUM: Methylmalonic Acid: 69 nmol/L (ref 0–378)

## 2024-01-30 LAB — SJOGREN'S SYNDROME ANTIBODS(SSA + SSB)
ENA SSA (RO) Ab: <0.2 AI (ref 0.0–0.9)
ENA SSB (LA) Ab: <0.2 AI (ref 0.0–0.9)

## 2024-01-30 LAB — VITAMIN B12: Vitamin B-12: 392 pg/mL (ref 232–1245)

## 2024-01-30 LAB — TSH: TSH: 0.663 u[IU]/mL (ref 0.450–4.500)

## 2024-01-30 LAB — HOMOCYSTEINE: Homocysteine: 12 umol/L (ref 0.0–19.2)

## 2024-01-31 NOTE — Telephone Encounter (Signed)
   Name: Justin [name]  DOB: [birth year]  MRN: [id number]   Primary Cardiologist: Arnoldo Lapping, MD  Chart reviewed as part of pre-operative protocol coverage.   Per E. [name] NP: [name] was doing well during his visit and able to complete greater than 4 METS of activity without any difficulty.  He is fine to proceed with colonoscopy and endoscopy procedure without any further cardiac testing.   He was no longer taking Plavix  and his chart has been updated to reflect.  Please reach out to pharmacy for guidance on holding Xarelto .    Per our clinical pharmacist: Per office protocol, patient can hold Xarelto  for 2 days prior to procedure.   Patient will not need bridging with Lovenox (enoxaparin) around procedure.    I will route this recommendation to the requesting party via Epic fax function and remove from pre-op pool. Please call with questions.  [name] [name], PA 01/31/2024, 2:32 PM

## 2024-01-31 NOTE — Telephone Encounter (Signed)
   Patient Name: Justin [name]  DOB: 24-Nov-1945 MRN: [id number]  Primary Cardiologist: Arnoldo Lapping, MD  Patient was seen by Charles Connor NP 6/5. Per Renelda Carry, Mr. Nasser was doing well during his visit and able to complete greater than 4 METS of activity without any difficulty.  He is fine to proceed with colonoscopy and endoscopy procedure without any further cardiac testing. He was no longer taking Plavix  and his chart has been updated to reflect. Pharm D reviewed chart, per their recommendations, patient can hold Xarelto  for 2 days prior to the procedure. Patient will not need bridging with Lovenox around the procedure.   I will fax this to requesting team to serve as preop clearance.    Debria Fang, PA-C 01/31/2024, 2:29 PM

## 2024-01-31 NOTE — Telephone Encounter (Signed)
 Patient with diagnosis of atrial fibrillation on Xarelto  for anticoagulation.    What type of surgery is being performed?     EGD and Colon  When is this surgery scheduled?     04-12-24    CHA2DS2-VASc Score = 4   This indicates a 4.8% annual risk of stroke. The patient's score is based upon: CHF History: 0 HTN History: 1 Diabetes History: 0 Stroke History: 0 Vascular Disease History: 1 Age Score: 2 Gender Score: 0   CrCl 95 Platelet count 251  Patient has not had an Afib/aflutter ablation within the last 3 months or DCCV within the last 30 days  Per office protocol, patient can hold Xarelto  for 2 days prior to procedure.   Patient will not need bridging with Lovenox (enoxaparin) around procedure.  **This guidance is not considered finalized until pre-operative APP has relayed final recommendations.**

## 2024-02-01 LAB — IBC + FERRITIN
Ferritin: 20.9 ng/mL — ABNORMAL LOW (ref 22.0–322.0)
Iron: 20 ug/dL — ABNORMAL LOW (ref 42–165)
Saturation Ratios: 4.6 % — ABNORMAL LOW (ref 20.0–50.0)
TIBC: 435.4 ug/dL (ref 250.0–450.0)
Transferrin: 311 mg/dL (ref 212.0–360.0)

## 2024-02-01 NOTE — Telephone Encounter (Signed)
Left message for patient to return call to our office to further discuss.  Will continue efforts.

## 2024-02-02 ENCOUNTER — Ambulatory Visit: Payer: Self-pay | Admitting: Physician Assistant

## 2024-02-02 NOTE — Telephone Encounter (Signed)
 Patient advised that he  has been given clearance to hold Xarelto  2 days prior to EGD and colonoscopy scheduled for 04-12-24.  Patient advised to take last dose of Xarelto  on 04-09-24, and he will be advised when to restart Xarelto  by Dr Willy Harvest after the procedure.  Patient agreed to plan and verbalized understanding.  No further questions.   When I called the patient to speak with him about holding Xarelto  prior to the procedure, he informed me that he has been taking Plavix .  He stated that no one has ever told him he should discontinue Plavix .  Patient stated he was just seen by [facility] on 01-25-24, and nothing was mentioned to him about discontinuing Plavix .  Patient advised he should contact cardiology for further recommendations.  Patient instructed that should he be told to continue Plavix  during the time of his procedure, he should contact us  to make us  aware.  Patient agreed to plan and verbalized understanding. No further questions.

## 2024-02-05 ENCOUNTER — Telehealth: Payer: Self-pay | Admitting: Cardiovascular Disease

## 2024-02-05 ENCOUNTER — Ambulatory Visit: Admitting: Podiatry

## 2024-02-05 NOTE — Telephone Encounter (Signed)
  Pt c/o medication issue:  1. Name of Medication: plavix    2. How are you currently taking this medication (dosage and times per day)?   3. Are you having a reaction (difficulty breathing--STAT)? No   4. What is your medication issue? Pt said, Dr. Arlester Ladd prescribed two blood thinner for him to take, plavix  and xarelto . However when he saw his urologist he was told he only have the xarelto  on his medication lists. He wants to clarify if he needs to be taking both plavix  and xarelto

## 2024-02-05 NOTE — Telephone Encounter (Signed)
 Patient identification verified by 2 forms. Sims Duck, RN     Called and spoke to patient  Patient states:  - was supposed to be off plavix  or xarelto  but can not remember which one.   Patient denies:  - abnormal symptoms at time of call.              Interventions/Plan: - Chart review from OV on 06/30/23 with Dr. Arlester Ladd shows patient was holding aspirin , to stop plavix  at mark following PCI and then restart aspirin  81mg  daily. However aspirin  not listed on current med list.  - OV note on 6/5 by Charles Connor, NP d/c plavix .  - Encounter forwarded to primary cardiologist for review/ recommendations.    Patient agrees with plan, no questions at this time

## 2024-02-05 NOTE — Telephone Encounter (Signed)
 Yes he should be on aspirin  81 mg daily now that he's off of plavix .

## 2024-02-07 NOTE — Telephone Encounter (Signed)
 I will reach out to our sleep team to ask if they may be able to call the pt to go over the instructions. When pt was here and was set up, I did write instructions down for his son. Looks like the pt said he left the notes here at check out, which I am sure have been discarded.

## 2024-02-07 NOTE — Telephone Encounter (Signed)
 Pt is requesting cb to get instructions for sleep study due to leaving them @ checkout(?)/ misplacing. Would like to know if it can be mailed to him

## 2024-02-08 NOTE — Telephone Encounter (Signed)
 Patient identification verified by 2 forms. Sims Duck, RN    Patient returned nurse call.  Relayed provider recommendations  Patient aware:  - Yes he should be on aspirin  81 mg daily now that he's off of plavix .      Patient verbalized understanding.

## 2024-02-08 NOTE — Telephone Encounter (Signed)
 Patient identification verified by 2 forms. Sims Duck, RN     Called patient. No answer. LVMTCB  Relayed provider recommendations via mychart. Message has not been read at this time.

## 2024-02-12 ENCOUNTER — Other Ambulatory Visit: Payer: Self-pay | Admitting: Cardiology

## 2024-02-12 ENCOUNTER — Ambulatory Visit: Admitting: Podiatry

## 2024-02-12 MED ORDER — ASPIRIN 81 MG PO TBEC: 81.0000 mg | DELAYED_RELEASE_TABLET | Freq: Every day | ORAL | Status: AC

## 2024-02-14 ENCOUNTER — Ambulatory Visit (INDEPENDENT_AMBULATORY_CARE_PROVIDER_SITE_OTHER): Admitting: Licensed Clinical Social Worker

## 2024-02-14 DIAGNOSIS — F3162 Bipolar disorder, current episode mixed, moderate: Secondary | ICD-10-CM | POA: Diagnosis not present

## 2024-02-16 ENCOUNTER — Other Ambulatory Visit: Payer: Self-pay | Admitting: Cardiovascular Disease

## 2024-02-18 ENCOUNTER — Encounter (HOSPITAL_COMMUNITY): Payer: Self-pay | Admitting: Licensed Clinical Social Worker

## 2024-02-18 NOTE — Progress Notes (Signed)
 Virtual Visit via Video Note  I connected with Justin [name] on 02/14/24 at 11:00 AM EDT by a video enabled telemedicine application and verified that I am speaking with the correct person using two identifiers.  Location: Patient: home Provider: home office   I discussed the limitations of evaluation and management by telemedicine and the availability of in person appointments. The patient expressed understanding and agreed to proceed.   I discussed the assessment and treatment plan with the patient. The patient was provided an opportunity to ask questions and all were answered. The patient agreed with the plan and demonstrated an understanding of the instructions.   The patient was advised to call back or seek an in-person evaluation if the symptoms worsen or if the condition fails to improve as anticipated.  I provided 45 minutes of non-face-to-face time during this encounter.   [name], LCSW   THERAPIST PROGRESS NOTE  Session Time: 11:00am-11:45am  Participation Level: Active  Behavioral Response: NeatAlertIrritable  Type of Therapy: Individual Therapy   Treatment Goals addressed:  LTG: Reduce frequency, intensity, and duration of depression symptoms so that daily functioning is improved   ProgressTowards Goals: Progressing  Interventions: Motivational Interviewing  Summary: Justin [name] is a 78 y.o. male who presents with Bipolar I, mixed, moderate.   Suicidal/Homicidal: Nowithout intent/plan  Therapist Response: [name] engaged well in individual virtual session with Facilities manager. Clinician utilized MI OARS to reflect and summarize thoughts, feelings, and interactions. [name] shared ongoing challenges in his relationship with son. Clinician reflected that the expectations of son's behavior to change are unhelpful for [name]. Clinician identified ways to manage his own expectations in order to reduce the chance of disappointment or frustration. Clinician processed  [name]'s hx of memory problems and noted that this continues to be a challenge. Clinician explored ways to document certain important tasks that need to be done, keep a calendar, and to give himself enough time to rest.   Plan: Return again in 2-4 weeks.  Diagnosis: Bipolar 1 disorder, mixed, moderate (HCC)  Collaboration of Care: Patient refused AEB none required  Patient/Guardian was advised Release of Information must be obtained prior to any record release in order to collaborate their care with an outside provider. Patient/Guardian was advised if they have not already done so to contact the registration department to sign all necessary forms in order for us  to release information regarding their care.   Consent: Patient/Guardian gives verbal consent for treatment and assignment of benefits for services provided during this visit. Patient/Guardian expressed understanding and agreed to proceed.   [name] [place], LCSW 02/18/2024

## 2024-02-22 ENCOUNTER — Ambulatory Visit: Admitting: Physician Assistant

## 2024-02-26 ENCOUNTER — Ambulatory Visit: Admitting: Gastroenterology

## 2024-03-12 ENCOUNTER — Ambulatory Visit (INDEPENDENT_AMBULATORY_CARE_PROVIDER_SITE_OTHER): Admitting: Licensed Clinical Social Worker

## 2024-03-12 ENCOUNTER — Encounter (HOSPITAL_COMMUNITY): Payer: Self-pay | Admitting: Licensed Clinical Social Worker

## 2024-03-12 DIAGNOSIS — F3162 Bipolar disorder, current episode mixed, moderate: Secondary | ICD-10-CM | POA: Diagnosis not present

## 2024-03-12 NOTE — Progress Notes (Signed)
 Virtual Visit via Video Note  I connected with Justin [name] on 03/12/24 at 11:00 AM EDT by a video enabled telemedicine application and verified that I am speaking with the correct person using two identifiers.  Location: Patient: home Provider: office   I discussed the limitations of evaluation and management by telemedicine and the availability of in person appointments. The patient expressed understanding and agreed to proceed.   I discussed the assessment and treatment plan with the patient. The patient was provided an opportunity to ask questions and all were answered. The patient agreed with the plan and demonstrated an understanding of the instructions.   The patient was advised to call back or seek an in-person evaluation if the symptoms worsen or if the condition fails to improve as anticipated.  I provided 55 minutes of non-face-to-face time during this encounter.   Justin JONELLE Rosser, LCSW   THERAPIST PROGRESS NOTE  Session Time: 11:00am-11:55am  Participation Level: Active  Behavioral Response: NeatAlertDepressed  Type of Therapy: Individual Therapy  Treatment Goals addressed: LTG: Reduce frequency, intensity, and duration of depression symptoms so that daily functioning is improved   ProgressTowards Goals: Progressing  Interventions: CBT  Summary: Justin [name] is a 78 y.o. male who presents with Bipolar I, mixed, moderate.   Suicidal/Homicidal: Nowithout intent/plan  Therapist Response: Justin [name] engaged well in individual virtual session with Facilities manager. Clinician utilized CBT to process thoughts, feelings and behaviors. Clinician explored mood and physical health. Clinician utilized reality testing about Justin [name]'s physical abilities, as compared to his age. 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  Clinician identified continuing frustration about his relationship with son and family. Clinician discussed ways to reframe the behaviors of others, noting that he is seen as some type of threat to his son's in-laws, they are jealous of him, and they continue to urge the family away from him due to this. Clinician noted that this is their problem, not Justin [name]'s and he can focus his energy on maintaining the relationships he has with his son and his daughter.   Justin [name] shared he continues to have trouble sleeping. This may be due to manic or hypomanic episodes. Justin [name] will see Dr. Curry in person in 4 weeks.   Plan: Return again in 2-3 weeks.  Diagnosis: Bipolar 1 disorder, mixed, moderate (HCC)  Collaboration of Care: Psychiatrist AEB updated Dr. Curry  Patient/Guardian was advised Release of Information must be obtained prior to any record release in order to collaborate their care with an outside provider. Patient/Guardian was advised if they have not already done so to contact the registration department to sign all necessary forms in order for us  to release information regarding their care.   Consent: Patient/Guardian gives verbal consent for treatment and assignment of benefits for services provided during this visit. Patient/Guardian expressed understanding and agreed to proceed.   Justin JONELLE [place], LCSW 03/12/2024

## 2024-04-02 ENCOUNTER — Ambulatory Visit (INDEPENDENT_AMBULATORY_CARE_PROVIDER_SITE_OTHER): Admitting: Licensed Clinical Social Worker

## 2024-04-02 DIAGNOSIS — F3162 Bipolar disorder, current episode mixed, moderate: Secondary | ICD-10-CM

## 2024-04-04 ENCOUNTER — Encounter: Payer: Self-pay | Admitting: Internal Medicine

## 2024-04-05 ENCOUNTER — Telehealth: Payer: Self-pay | Admitting: Internal Medicine

## 2024-04-05 NOTE — Telephone Encounter (Signed)
 Inbound call from patient stating he would like to know when to stop taking blood thinners since he has an upcoming procedure on 8/22   Requesting a call back  Please advise  Thank you

## 2024-04-05 NOTE — Telephone Encounter (Signed)
 Patient states that he is no longer taking the Plavix . His cardiologist stopped this medication in June. After reviewing previous notes, patient had been given clearance to hold Xarelto  for 2 days prior to procedure. Last dose will be 04/09/2024. Patient repeated back date and verbalized understanding.

## 2024-04-07 ENCOUNTER — Encounter (HOSPITAL_COMMUNITY): Payer: Self-pay | Admitting: Licensed Clinical Social Worker

## 2024-04-07 NOTE — Progress Notes (Signed)
 Virtual Visit via Video Note  I connected with Justin [name] on 04/02/24 at  2:30 PM EDT by a video enabled telemedicine application and verified that I am speaking with the correct person using two identifiers.  Location: Patient: home Provider: office   I discussed the limitations of evaluation and management by telemedicine and the availability of in person appointments. The patient expressed understanding and agreed to proceed.   I discussed the assessment and treatment plan with the patient. The patient was provided an opportunity to ask questions and all were answered. The patient agreed with the plan and demonstrated an understanding of the instructions.   The patient was advised to call back or seek an in-person evaluation if the symptoms worsen or if the condition fails to improve as anticipated.  I provided 45 minutes of non-face-to-face time during this encounter.   [name], LCSW   THERAPIST PROGRESS NOTE  Session Time: 2:30pm-3:15pm  Participation Level: Active  Behavioral Response: Well GroomedAlertDepressed  Type of Therapy: Individual Therapy  Treatment Goals addressed:  LTG: Reduce frequency, intensity, and duration of depression symptoms so that daily functioning is improved   ProgressTowards Goals: Not Progressing  Interventions: CBT  Summary: Justin [name] is a 78 y.o. male who presents with Bipolar I, mixed, moderate.   Suicidal/Homicidal: Nowithout intent/plan  Therapist Response: [name] engaged well in individual virtual session with Facilities manager. Clinician utilized CBT to process thoughts, feelings, and interactions. Clinician processed recent problems with son and his attitude toward [place]. Clinician discussed the challenges and continued to discuss acceptance as a change of understanding about son's behaviors. Clinician processed more positive relationship with daughter and noted that he recently had a very cathartic moment with daughter. He  shared he truly feels she is the only family member that really understands and accepts him for who he is. Clinician explored ways to focus more on this relationship, as it is more helpful. Clinician also identified ways to extend his own understanding of his family's issues.   Plan: Return again in 2 weeks.  Diagnosis: Bipolar 1 disorder, mixed, moderate (HCC)  Collaboration of Care: Patient refused AEB none required  Patient/Guardian was advised Release of Information must be obtained prior to any record release in order to collaborate their care with an outside provider. Patient/Guardian was advised if they have not already done so to contact the registration department to sign all necessary forms in order for us  to release information regarding their care.   Consent: Patient/Guardian gives verbal consent for treatment and assignment of benefits for services provided during this visit. Patient/Guardian expressed understanding and agreed to proceed.   [name] [place], LCSW 04/07/2024

## 2024-04-11 ENCOUNTER — Ambulatory Visit (HOSPITAL_COMMUNITY): Admitting: Psychiatry

## 2024-04-11 NOTE — Progress Notes (Unsigned)
 [facility] History and Physical   Primary Care Physician:  Justin [name] ORN, MD   Reason for Procedure:    Encounter Diagnoses  Name Primary?   Iron deficiency anemia, unspecified iron deficiency anemia type Yes   Abnormal CT scan, colon    Esophageal dysphagia    Loss of weight      Plan:    EGD, possible esophageal dilation colonoscopy     HPI: Justin [name] is a 78 y.o. male with a history of colon polyps, iron deficiency anemia dysphagia and some weight loss.  He had a CT angio of the chest abdomen and pelvis in late 2024 and there was some focal thickening in the transverse colon of about 5 cm in length.  He was due for a repeat routine colonoscopy in 2023 but did not have that, (history of polyps).  He also has a history of PCI for NSTEMI in 2024, he is now off Plavix  but had been taking Xarelto  because of a history of atrial fibrillation. Xarelto  has been held.  Cardiology has signed off on this.   Past Medical History:  Diagnosis Date   Anemia    Anxiety and depression    Aortic insufficiency 03/11/2022   Echocardiogram 02/2022: EF 70-75, no RWMA, moderate LVH, normal RVSF, normal PASP (RVSP 20), moderate LAE, mild MR, moderate AI, AV sclerosis without stenosis, no aortic root or ascending aorta dilation   Arthritis    knees, c-spine // gets lumbar ESI q 6 mos   Bipolar disorder (HCC)    CAD (coronary artery disease)    [place]  08/2009 >> [facility] ([facility]) - mLAD 70, Dx 65 >> PCI:  3x15 mm Endeavor DES to mLAD and 2.5x12 mm Endeavor DES to Dx // S/p NSTEMI >> [facility] 8/12 ([facility]) - LM ok, LAD prox and mid 40; LAD stent ok, Dx stent ok, mRCA 30, mLCx 50 >> med Rx // ETT-Echo 2/17 ([facility]): Normal, EF 55-60 at rest   Cataract 2023   bilaterally and have been removed   Chicken pox    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HCC)    Grief    History of echocardiogram    Echo 3/19: Mild concentric LVH, EF 60-65, normal wall motion, grade 1 diastolic dysfunction, mild AI, mildly  dilated aortic root (40 mm), MAC, mild LAE, atrial septal lipomatous hypertrophy // Echo 8/22: EF 55-60, no RWMA, mild LVH, GRII DD, normal RVSF, RVSP 40, mild LAE, trivial MR, mild-moderate AI, AV sclerosis without stenosis, mild dilation of aortic root (39 mm)   History of non-ST elevation myocardial infarction (NSTEMI) 2010   History of nuclear stress test    Nuclear stress test 3/19: EF 57, inferior/inferoseptal/inferolateral defect consistent with probable soft tissue attenuation (cannot exclude subendocardial scar), no ischemia, intermediate risk >> Echo 3/19 normal EF, normal wall motion   History of pneumonia 2011   History of prostatitis 1990s   Hx of adenomatous colonic polyps 06/22/2016   Hyperlipidemia    Hypertension    Migraines    prior history   Myocardial infarction (HCC)    PTSD (post-traumatic stress disorder)     Past Surgical History:  Procedure Laterality Date   ANKLE SURGERY Right    tendon repair states ended up with nerve damage, with constant pain   ATRIAL FIBRILLATION ABLATION N/A 05/17/2022   Procedure: ATRIAL FIBRILLATION ABLATION;  Surgeon: Justin Ole DASEN, MD;  Location: [facility] INVASIVE CV LAB;  Service: Cardiovascular;  Laterality: N/A;   ATRIAL FIBRILLATION ABLATION N/A  02/14/2023   Procedure: ATRIAL FIBRILLATION ABLATION;  Surgeon: Justin Ole DASEN, MD;  Location: [facility] INVASIVE CV LAB;  Service: Cardiovascular;  Laterality: N/A;   CARDIOVERSION N/A 08/05/2021   Procedure: CARDIOVERSION;  Surgeon: Justin Aleene PARAS, MD;  Location: [facility] ENDOSCOPY;  Service: Cardiovascular;  Laterality: N/A;   CARDIOVERSION N/A 07/28/2022   Procedure: CARDIOVERSION;  Surgeon: Justin Vinie BROCKS, MD;  Location: [facility] ENDOSCOPY;  Service: Cardiovascular;  Laterality: N/A;   CARDIOVERSION N/A 09/23/2022   Procedure: CARDIOVERSION;  Surgeon: Justin Soyla LABOR, MD;  Location: [facility] ENDOSCOPY;  Service: Cardiovascular;  Laterality: N/A;   CATARACT EXTRACTION Bilateral    CERVICAL  DISCECTOMY     COLONOSCOPY  03/2017   COLONOSCOPY WITH PROPOFOL   07/22/2019   Justin [name]   CORONARY STENT INTERVENTION N/A 06/15/2023   Procedure: CORONARY STENT INTERVENTION;  Surgeon: Justin Sharper, MD;  Location: [facility] INVASIVE CV LAB;  Service: Cardiovascular;  Laterality: N/A;   ELBOW SURGERY Left    ELBOW SURGERY Right    INGUINAL HERNIA REPAIR Bilateral    1996, 1997   KNEE ARTHROSCOPY Right    KNEE CARTILAGE SURGERY Left    LEFT HEART CATH AND CORONARY ANGIOGRAPHY N/A 06/15/2023   Procedure: LEFT HEART CATH AND CORONARY ANGIOGRAPHY;  Surgeon: Justin Sharper, MD;  Location: [facility] INVASIVE CV LAB;  Service: Cardiovascular;  Laterality: N/A;   NASAL SEPTUM SURGERY     STENT PLACEMENT VASCULAR ([facility] HX)  09/02/2010   TONSILLECTOMY     TOTAL KNEE ARTHROPLASTY Left 08/31/2018   Procedure: TOTAL KNEE ARTHROPLASTY;  Surgeon: Justin Rush, MD;  Location: [facility] ORS;  Service: Orthopedics;  Laterality: Left;     Current Outpatient Medications  Medication Sig Dispense Refill   aspirin  EC 81 MG tablet Take 1 tablet (81 mg total) by mouth daily. Swallow whole.     diltiazem  (CARDIZEM  CD) 240 MG 24 hr capsule Take 1 capsule (240 mg total) by mouth daily. 90 capsule 3   isosorbide  mononitrate (IMDUR ) 30 MG 24 hr tablet TAKE 1 TABLET BY MOUTH DAILY 90 tablet 3   lamoTRIgine  (LAMICTAL ) 200 MG tablet Take 1 tablet (200 mg total) by mouth daily. 30 tablet 2   pantoprazole  (PROTONIX ) 40 MG tablet TAKE 1 TABLET BY MOUTH DAILY 90 tablet 3   rosuvastatin  (CRESTOR ) 40 MG tablet Take 1 tablet (40 mg total) by mouth daily. 90 tablet 2   ALPRAZolam  (XANAX ) 1 MG tablet TAKE 1/2 TO 1 TABLET BY MOUTH ONCE DAILY 30 tablet 2   Blood Pressure Monitoring (OMRON 3 SERIES BP MONITOR) DEVI Use as directed to monitor blood pressure. 1 each 0   chlorproMAZINE  (THORAZINE ) 10 MG tablet Take 1 tablet (10 mg total) by mouth at bedtime. 30 tablet 2   nitroGLYCERIN  (NITROSTAT ) 0.4 MG SL tablet Place 1 tablet under the tongue  every 5 minutes as needed for chest pain 25 tablet 3   rivaroxaban  (XARELTO ) 20 MG TABS tablet Take 1 tablet (20 mg total) by mouth daily. 30 tablet 5   Current Facility-Administered Medications  Medication Dose Route Frequency Provider Last Rate Last Admin   0.9 %  sodium chloride  infusion  500 mL Intravenous Once Avram Lupita BRAVO, MD        Allergies as of 04/12/2024 - Review Complete 04/12/2024  Allergen Reaction Noted   Ativan  [lorazepam ] Other (See Comments) 02/23/2021   Ambien  [zolpidem  tartrate] Other (See Comments) 12/23/2014   Codeine   12/02/2015   Amiodarone  Other (See Comments) 09/21/2022   Amitriptyline Palpitations 08/17/2022   Gabapentin   Other (See Comments) 02/08/2023   Lyrica [pregabalin] Nausea Only 05/16/2023   Seroquel  [quetiapine ] Other (See Comments) 06/09/2017    Family History  Problem Relation Age of Onset   Arthritis Mother    Heart disease Mother        s/p CABG   Hypertension Mother    Alzheimer's disease Mother    Heart attack Mother    Arthritis Father    Hyperlipidemia Father    Heart disease Father        s/p CABG   Stroke Father    Hypertension Father    Heart attack Father    Multiple sclerosis Sister    Diabetes Sister    Diabetes Sister    Heart attack Sister    Pulmonary embolism Sister        died   Depression Brother        suicide   Lung cancer Maternal Aunt    Lung cancer Paternal Aunt    Lung cancer Paternal Grandmother    Colon cancer Neg Hx    Colon polyps Neg Hx    Stomach cancer Neg Hx    Rectal cancer Neg Hx    Esophageal cancer Neg Hx     Social History   Socioeconomic History   Marital status: Widowed    Spouse name: Not on file   Number of children: 2   Years of education: 39   Highest education level: Not on file  Occupational History   Occupation: Retired  Tobacco Use   Smoking status: Never    Passive exposure: Never   Smokeless tobacco: Never   Tobacco comments:    Never smoke 06/14/22  Vaping Use    Vaping status: Never Used  Substance and Sexual Activity   Alcohol use: Yes    Comment: occ   Drug use: Never   Sexual activity: Not Currently  Other Topics Concern   Not on file  Social History Narrative   Retired, widowed in 2017    1 son / 1 daughter   2 caffeinated beverages daily no alcohol or tobacco   Fun: Work out in the yard.   Denies religious beliefs effecting healthcare.    Worked at [employer] for 30 years   Social Drivers of [employer]: Low Risk  (12/26/2023)   Overall Financial Resource Strain (CARDIA)    Difficulty of Paying Living Expenses: Not hard at all  Food Insecurity: No Food Insecurity (12/26/2023)   Hunger Vital Sign    Worried About Running Out of Food in the Last Year: Never true    Ran Out of Food in the Last Year: Never true  Transportation Needs: No Transportation Needs (12/26/2023)   PRAPARE - Administrator, Civil Service (Medical): No    Lack of Transportation (Non-Medical): No  Physical Activity: Insufficiently Active (12/26/2023)   Exercise Vital Sign    Days of Exercise per Week: 5 days    Minutes of Exercise per Session: 10 min  Stress: Stress Concern Present (12/26/2023)   [employer] of Occupational Health - Occupational Stress Questionnaire    Feeling of Stress : To some extent  Social Connections: Socially Isolated (12/26/2023)   Social Connection and Isolation Panel    Frequency of Communication with Friends and Family: Once a week    Frequency of Social Gatherings with Friends and Family: Never    Attends Religious Services: Never    Database administrator or Organizations: No  Attends Banker Meetings: Never    Marital Status: Widowed  Intimate Partner Violence: Not At Risk (12/26/2023)   Humiliation, Afraid, Rape, and Kick questionnaire    Fear of Current or Ex-Partner: No    Emotionally Abused: No    Physically Abused: No    Sexually Abused: No    Review of Systems:  All  other review of systems negative except as mentioned in the HPI.  Physical Exam: Vital signs BP (!) 144/80   Pulse 72   Temp 97.7 F (36.5 C) (Temporal)   Ht 5' 11 (1.803 m)   Wt 172 lb 9.6 oz (78.3 kg)   SpO2 100%   BMI 24.07 kg/m   General:   Alert,  Well-developed, well-nourished, pleasant and cooperative in NAD Lungs:  Clear throughout to auscultation.   Heart:  Regular rate and rhythm; no murmurs, clicks, rubs,  or gallops. Abdomen:  Soft, nontender and nondistended. Normal bowel sounds.   Neuro/Psych:  Alert and cooperative. Normal mood and affect. A and O x 3   @Areonna Bran  CHARLENA Commander, MD, [facility] [phone] (pager) 04/12/2024 11:46 AM@

## 2024-04-12 ENCOUNTER — Other Ambulatory Visit (INDEPENDENT_AMBULATORY_CARE_PROVIDER_SITE_OTHER)

## 2024-04-12 ENCOUNTER — Ambulatory Visit: Admitting: Internal Medicine

## 2024-04-12 ENCOUNTER — Encounter: Payer: Self-pay | Admitting: Internal Medicine

## 2024-04-12 VITALS — BP 130/72 | HR 65 | Temp 97.7°F | Resp 10 | Ht 71.0 in | Wt 172.6 lb

## 2024-04-12 DIAGNOSIS — D509 Iron deficiency anemia, unspecified: Secondary | ICD-10-CM

## 2024-04-12 DIAGNOSIS — K648 Other hemorrhoids: Secondary | ICD-10-CM | POA: Diagnosis not present

## 2024-04-12 DIAGNOSIS — R1310 Dysphagia, unspecified: Secondary | ICD-10-CM

## 2024-04-12 DIAGNOSIS — R933 Abnormal findings on diagnostic imaging of other parts of digestive tract: Secondary | ICD-10-CM

## 2024-04-12 DIAGNOSIS — K5730 Diverticulosis of large intestine without perforation or abscess without bleeding: Secondary | ICD-10-CM

## 2024-04-12 DIAGNOSIS — R634 Abnormal weight loss: Secondary | ICD-10-CM

## 2024-04-12 DIAGNOSIS — Q399 Congenital malformation of esophagus, unspecified: Secondary | ICD-10-CM

## 2024-04-12 DIAGNOSIS — K6389 Other specified diseases of intestine: Secondary | ICD-10-CM

## 2024-04-12 DIAGNOSIS — K644 Residual hemorrhoidal skin tags: Secondary | ICD-10-CM

## 2024-04-12 DIAGNOSIS — D123 Benign neoplasm of transverse colon: Secondary | ICD-10-CM

## 2024-04-12 DIAGNOSIS — R1319 Other dysphagia: Secondary | ICD-10-CM

## 2024-04-12 DIAGNOSIS — K222 Esophageal obstruction: Secondary | ICD-10-CM | POA: Diagnosis not present

## 2024-04-12 DIAGNOSIS — D125 Benign neoplasm of sigmoid colon: Secondary | ICD-10-CM

## 2024-04-12 LAB — CBC
HCT: 36.4 % — ABNORMAL LOW (ref 39.0–52.0)
Hemoglobin: 11.6 g/dL — ABNORMAL LOW (ref 13.0–17.0)
MCHC: 31.9 g/dL (ref 30.0–36.0)
MCV: 85.8 fl (ref 78.0–100.0)
Platelets: 229 K/uL (ref 150.0–400.0)
RBC: 4.24 Mil/uL (ref 4.22–5.81)
RDW: 15.2 % (ref 11.5–15.5)
WBC: 3.7 K/uL — ABNORMAL LOW (ref 4.0–10.5)

## 2024-04-12 MED ORDER — SODIUM CHLORIDE 0.9 % IV SOLN
500.0000 mL | Freq: Once | INTRAVENOUS | Status: DC
Start: 2024-04-12 — End: 2024-04-12

## 2024-04-12 NOTE — Progress Notes (Signed)
 Sedate, gd SR, tolerated procedure well, VSS, report to RN

## 2024-04-12 NOTE — Progress Notes (Signed)
Updated medical record with pt

## 2024-04-12 NOTE — Progress Notes (Signed)
 Called to room to assist during endoscopic procedure.  Patient ID and intended procedure confirmed with present staff. Received instructions for my participation in the procedure from the performing physician.

## 2024-04-12 NOTE — Op Note (Signed)
 [facility] Patient Name: Justin [name] Procedure Date: 04/12/2024 11:47 AM MRN: [id number] Endoscopist: Lupita FORBES Commander , MD, [id number] Age: 78 Referring MD:  Date of Birth: [birth year] Gender: Male Account #: [id number] Procedure:                Upper GI endoscopy Indications:              Dysphagia, Weight loss + Iron deficiency Anemia Medicines:                Monitored Anesthesia Care Procedure:                Pre-Anesthesia Assessment:                           - Prior to the procedure, a History and Physical                            was performed, and patient medications and                            allergies were reviewed. The patient's tolerance of                            previous anesthesia was also reviewed. The risks                            and benefits of the procedure and the sedation                            options and risks were discussed with the patient.                            All questions were answered, and informed consent                            was obtained. Prior Anticoagulants: The patient                            last took Xarelto  (rivaroxaban ) 2 days prior to the                            procedure. ASA Grade Assessment: III - A patient                            with severe systemic disease. After reviewing the                            risks and benefits, the patient was deemed in                            satisfactory condition to undergo the procedure.                           After obtaining informed consent, the endoscope was  passed under direct vision. Throughout the                            procedure, the patient's blood pressure, pulse, and                            oxygen saturations were monitored continuously. The                            Olympus Scope [id number] was introduced through the                            mouth, and advanced to the second part of duodenum.                             The upper GI endoscopy was accomplished without                            difficulty. The patient tolerated the procedure                            well. Scope In: Scope Out: Findings:                 The examined esophagus was mildly tortuous.                           One benign-appearing, intrinsic mild stenosis was                            found at the gastroesophageal junction. The                            stenosis was traversed. The scope was withdrawn.                            Dilation was performed with a Maloney dilator with                            mild resistance at 54 Fr. The dilation site was                            examined following endoscope reinsertion and showed                            a small superficial mucosal tear in the cardia.                            Estimated blood loss was minimal.                           The exam was otherwise without abnormality.                           The cardia and gastric fundus were  normal on                            retroflexion. Complications:            No immediate complications. Estimated Blood Loss:     Estimated blood loss was minimal. Impression:               - Tortuous esophagus.                           - Benign-appearing esophageal stenosis. Dilated.                           - The examination was otherwise normal.                           - No specimens collected. Recommendation:           - Patient has a contact number available for                            emergencies. The signs and symptoms of potential                            delayed complications were discussed with the                            patient. Return to normal activities tomorrow.                            Written discharge instructions were provided to the                            patient.                           - Clear liquids x 1 hour then soft foods rest of                            day. Start prior diet  tomorrow.                           - Continue present medications.                           - Resume Xarelto  (rivaroxaban ) at prior dose in 2                            days.                           - See the other procedure note for documentation of                            additional recommendations. Lupita FORBES Commander, MD 04/12/2024 12:39:02 PM This report has been signed electronically.

## 2024-04-12 NOTE — Patient Instructions (Addendum)
 The upper exam showed a mildly tortuous esophagus and I thought some narrowing where it meets the stomach. I dilated it - the esophagus was unchanged but there was a superficial tear of the stomach lining just below the esophagus. This should heal soon on its own.  I suspect the swallowing difficulties are in large part related to your dental issues as you indicated. Please use a moist and minced diet (see attached).  The colonoscopy revealed 6 small polyps - removed. Also have diverticulosis and hemorrhoids.  I will review the pathology and make a recommendation about repeating colonoscopy - at your age and health issues may not recommend.  Please restart Xarelto  in 2 days (Sunday 8/24).  Please be sure to continue iron tablets (ferrous sulfate).  I am rechecking blood count today.  I appreciate the opportunity to care for you. Lupita CHARLENA Commander, MD, FACG   POST DILATION DIET--SEE HANDOUT, ONE HOUR OF CLEAR LIQUIDS THEN SOFT DIET, RESUME PREVIOUS DIET TOMORROW Continue present medications TODAY, EXCEPT XARELTO  (BLOOD THINNER) RESUME IN 2 DAYS ON SUNDAY 04/14/24 Await pathology results Visit lab today prior to discharge, we will take you there prior to taking you to the car.   Handouts/information given for polyps, diverticulosis and post dilation diet  YOU HAD AN ENDOSCOPIC PROCEDURE TODAY AT THE [facility]:   Refer to the procedure report that was given to you for any specific questions about what was found during the examination.  If the procedure report does not answer your questions, please call your gastroenterologist to clarify.  If you requested that your care partner not be given the details of your procedure findings, then the procedure report has been included in a sealed envelope for you to review at your convenience later.  YOU SHOULD EXPECT: Some feelings of bloating in the abdomen. Passage of more gas than usual.  Walking can help get rid of the air that was put  into your GI tract during the procedure and reduce the bloating. If you had a lower endoscopy (such as a colonoscopy or flexible sigmoidoscopy) you may notice spotting of blood in your stool or on the toilet paper. If you underwent a bowel prep for your procedure, you may not have a normal bowel movement for a few days.  Please Note:  You might notice some irritation and congestion in your nose or some drainage.  This is from the oxygen used during your procedure.  There is no need for concern and it should clear up in a day or so.  SYMPTOMS TO REPORT IMMEDIATELY:  Following lower endoscopy (colonoscopy):  Excessive amounts of blood in the stool  Significant tenderness or worsening of abdominal pains  Swelling of the abdomen that is new, acute  Fever of 100F or higher Following upper endoscopy (EGD)  Vomiting of blood or coffee ground material  New chest pain or pain under the shoulder blades  Painful or persistently difficult swallowing  New shortness of breath  Black, tarry-looking stools For urgent or emergent issues, a gastroenterologist can be reached at any hour by calling [phone]. Do not use MyChart messaging for urgent concerns.    DIET: See post dilation diet handout. We do recommend a small meal at first, but then you may proceed to your regular diet.  Drink plenty of fluids but you should avoid alcoholic beverages for 24 hours.  ACTIVITY:  You should plan to take it easy for the rest of today and you should NOT DRIVE or  use heavy machinery until tomorrow (because of the sedation medicines used during the test).    FOLLOW UP: Our staff will call the number listed on your records the next business day following your procedure.  We will call around 7:15- 8:00 am to check on you and address any questions or concerns that you may have regarding the information given to you following your procedure. If we do not reach you, we will leave a message.     If any biopsies were  taken you will be contacted by phone or by letter within the next 1-3 weeks.  Please call us  at [phone] if you have not heard about the biopsies in 3 weeks.   SIGNATURES/CONFIDENTIALITY: You and/or your care partner have signed paperwork which will be entered into your electronic medical record.  These signatures attest to the fact that that the information above on your After Visit Summary has been reviewed and is understood.  Full responsibility of the confidentiality of this discharge information lies with you and/or your care-partner.

## 2024-04-12 NOTE — Op Note (Signed)
 [facility] Patient Name: Justin [name] Procedure Date: 04/12/2024 11:38 AM MRN: [id number] Endoscopist: Lupita FORBES Commander , MD, [id number] Age: 78 Referring MD:  Date of Birth: 1946-04-09 Gender: Male Account #: [id number] Procedure:                Colonoscopy Indications:              Iron deficiency anemia, Abnormal CT of the GI tract                            + hx colon polyps last colonoscopy 2020 Medicines:                Monitored Anesthesia Care Procedure:                Pre-Anesthesia Assessment:                           - Prior to the procedure, a History and Physical                            was performed, and patient medications and                            allergies were reviewed. The patient's tolerance of                            previous anesthesia was also reviewed. The risks                            and benefits of the procedure and the sedation                            options and risks were discussed with the patient.                            All questions were answered, and informed consent                            was obtained. Prior Anticoagulants: The patient                            last took Xarelto  (rivaroxaban ) 2 days prior to the                            procedure. ASA Grade Assessment: III - A patient                            with severe systemic disease. After reviewing the                            risks and benefits, the patient was deemed in                            satisfactory condition to undergo the procedure.  After obtaining informed consent, the colonoscope                            was passed under direct vision. Throughout the                            procedure, the patient's blood pressure, pulse, and                            oxygen saturations were monitored continuously. The                            Olympus Scope SN [id number] was introduced through the                             anus and advanced to the the cecum, identified by                            appendiceal orifice and ileocecal valve. The                            colonoscopy was performed without difficulty. The                            patient tolerated the procedure well. The quality                            of the bowel preparation was good. The ileocecal                            valve, appendiceal orifice, and rectum were                            photographed. The bowel preparation used was                            MiraLax  +SUPREP via extended prep with split dose                            instruction. Scope In: 12:06:57 PM Scope Out: 12:25:38 PM Scope Withdrawal Time: 0 hours 10 minutes 42 seconds  Total Procedure Duration: 0 hours 18 minutes 41 seconds  Findings:                 Six sessile polyps were found in the sigmoid colon                            and transverse colon. The polyps were diminutive in                            size. These polyps were removed with a cold snare.                            Resection and retrieval were complete.  Verification                            of patient identification for the specimen was                            done. Estimated blood loss was minimal.                           Multiple large-mouthed, medium-mouthed and                            small-mouthed diverticula were found in the sigmoid                            colon and descending colon.                           External and internal hemorrhoids were found.                           A diffuse area of mildly melanotic mucosa was found                            in the entire colon.                           The exam was otherwise without abnormality on                            direct and retroflexion views. Complications:            No immediate complications. Estimated Blood Loss:     Estimated blood loss was minimal. Impression:               - Six diminutive polyps in  the sigmoid colon and in                            the transverse colon, removed with a cold snare.                            Resected and retrieved.                           - Severe diverticulosis in the sigmoid colon and in                            the descending colon.                           - External and internal hemorrhoids.                           - Melanotic mucosa in the entire examined colon.                           -  The examination was otherwise normal on direct                            and retroflexion views. No abnormality in ascending                            colon (CT had indicated possible wall thickening)                           - Personal history of colonic polyps. Recommendation:           - Patient has a contact number available for                            emergencies. The signs and symptoms of potential                            delayed complications were discussed with the                            patient. Return to normal activities tomorrow.                            Written discharge instructions were provided to the                            patient.                           - Clear liquids x 1 hour then soft foods rest of                            day. Start prior diet tomorrow.                           - Continue present medications.                           - Resume Xarelto  (rivaroxaban ) at prior dose in 2                            days.                           - Await pathology results.                           - No recommendation at this time regarding repeat                            colonoscopy due to age. Lupita FORBES Commander, MD 04/12/2024 12:46:50 PM This report has been signed electronically.

## 2024-04-13 ENCOUNTER — Ambulatory Visit: Payer: Self-pay | Admitting: Internal Medicine

## 2024-04-15 ENCOUNTER — Telehealth: Payer: Self-pay

## 2024-04-15 NOTE — Telephone Encounter (Signed)
  Follow up Call-     04/12/2024   11:09 AM  Call back number  Post procedure Call Back phone  # [phone]  Permission to leave phone message Yes     Patient questions:  Do you have a fever, pain , or abdominal swelling? No. Pain Score  0 *  Have you tolerated food without any problems? Yes.    Have you been able to return to your normal activities? Yes.    Do you have any questions about your discharge instructions: Diet   No. Medications  No. Follow up visit  No.  Do you have questions or concerns about your Care? No.  Actions: * If pain score is 4 or above: No action needed, pain <4.

## 2024-04-16 LAB — SURGICAL PATHOLOGY

## 2024-04-23 ENCOUNTER — Telehealth: Payer: Self-pay | Admitting: Internal Medicine

## 2024-04-23 NOTE — Telephone Encounter (Signed)
 Left message on machine to call back   Justin [name]         04/19/2024 [address] [place] [address]          Dear [name],  The polyps I removed from your colon were benign adenomas though they were precancerous.  Given this history and your overall history with polyps I think it is reasonable to consider a repeat colonoscopy so I recommend an office visit in 3 years to review your situation and determine if a colonoscopy makes sense at that time.  Please call us  at [phone]   if you have persistent problems or have questions about your condition that have not been fully answered at this time.   Sincerely,  Lupita FORBES Commander, MD

## 2024-04-23 NOTE — Telephone Encounter (Signed)
 The pt has returned call and been made aware of the path letter. No further questions at this time.

## 2024-04-23 NOTE — Telephone Encounter (Signed)
 PT is calling to discuss ECL results done on  8/22. Please advise.

## 2024-04-24 ENCOUNTER — Ambulatory Visit (HOSPITAL_COMMUNITY): Admitting: Licensed Clinical Social Worker

## 2024-04-25 ENCOUNTER — Encounter: Payer: Self-pay | Admitting: Cardiology

## 2024-05-08 ENCOUNTER — Ambulatory Visit (INDEPENDENT_AMBULATORY_CARE_PROVIDER_SITE_OTHER): Admitting: Licensed Clinical Social Worker

## 2024-05-08 DIAGNOSIS — F3130 Bipolar disorder, current episode depressed, mild or moderate severity, unspecified: Secondary | ICD-10-CM | POA: Diagnosis not present

## 2024-05-09 ENCOUNTER — Telehealth (HOSPITAL_COMMUNITY): Payer: Self-pay | Admitting: Licensed Clinical Social Worker

## 2024-05-09 NOTE — Progress Notes (Unsigned)
 Virtual Visit via Video Note  I connected with Justin [name] on 05/08/24 at  4:30 PM EDT by a video enabled telemedicine application and verified that I am speaking with the correct person using two identifiers.  Location: Patient: home Provider: home office   I discussed the limitations of evaluation and management by telemedicine and the availability of in person appointments. The patient expressed understanding and agreed to proceed.   I discussed the assessment and treatment plan with the patient. The patient was provided an opportunity to ask questions and all were answered. The patient agreed with the plan and demonstrated an understanding of the instructions.   The patient was advised to call back or seek an in-person evaluation if the symptoms worsen or if the condition fails to improve as anticipated.  I provided 45 minutes of non-face-to-face time during this encounter.   [name], LCSW   THERAPIST PROGRESS NOTE  Session Time: 4:30pm-5:15pm  Participation Level: Active  Behavioral Response: NeatAlertDepressed  Type of Therapy: Individual Therapy  Treatment Goals addressed: LTG: Reduce frequency, intensity, and duration of depression symptoms so that daily functioning is improved   ProgressTowards Goals: Not Progressing  Interventions: Motivational Interviewing  Summary: Justin [name] is a 78 y.o. male who presents with Bipolar I, depressed.   Suicidal/Homicidal: Nowithout intent/plan  Therapist Response: Justin [name] engaged well in individual virtual session. Clinician processed thoughts, feelings, and interactions using MI OARS. Clinician reflected continuing struggles with family members, noting that he has a much higher expectation of their communication with him than he receives. Clinician discussed changing his expectations if possible, in order to reduce the pain that lack of communication brings. Clinician provided time and space for Justin [name] to share his  feelings of rejection, disappointment, and loneliness. Clinician explored alternative avenues of socialization. Justin [name] shared that his energy level is limited and he conserves it so he can take care of his home, yard, and dog.   Plan: Return again in 2 weeks.  Diagnosis: Bipolar I disorder, most recent episode depressed (HCC)  Collaboration of Care: Patient refused AEB none required  Patient/Guardian was advised Release of Information must be obtained prior to any record release in order to collaborate their care with an outside provider. Patient/Guardian was advised if they have not already done so to contact the registration department to sign all necessary forms in order for us  to release information regarding their care.   Consent: Patient/Guardian gives verbal consent for treatment and assignment of benefits for services provided during this visit. Patient/Guardian expressed understanding and agreed to proceed.   [name] [place], LCSW 05/09/2024

## 2024-05-10 ENCOUNTER — Encounter (HOSPITAL_COMMUNITY): Payer: Self-pay | Admitting: Licensed Clinical Social Worker

## 2024-05-13 ENCOUNTER — Encounter (HOSPITAL_COMMUNITY): Payer: Self-pay | Admitting: Psychiatry

## 2024-05-13 ENCOUNTER — Telehealth (HOSPITAL_BASED_OUTPATIENT_CLINIC_OR_DEPARTMENT_OTHER): Admitting: Psychiatry

## 2024-05-13 VITALS — Wt 172.0 lb

## 2024-05-13 DIAGNOSIS — F4310 Post-traumatic stress disorder, unspecified: Secondary | ICD-10-CM

## 2024-05-13 DIAGNOSIS — F5101 Primary insomnia: Secondary | ICD-10-CM | POA: Diagnosis not present

## 2024-05-13 DIAGNOSIS — F3162 Bipolar disorder, current episode mixed, moderate: Secondary | ICD-10-CM

## 2024-05-13 MED ORDER — CHLORPROMAZINE HCL 10 MG PO TABS: 10.0000 mg | ORAL_TABLET | Freq: Every day | ORAL | 2 refills | Status: AC

## 2024-05-13 MED ORDER — LAMOTRIGINE 200 MG PO TABS: 200.0000 mg | ORAL_TABLET | Freq: Every day | ORAL | 2 refills | Status: AC

## 2024-05-13 NOTE — Progress Notes (Signed)
 [facility] MD Virtual Progress Note   Patient Location: Home Provider Location: Home Office  I connect with patient by telephone and verified that I am speaking with correct person by using two identifiers. I discussed the limitations of evaluation and management by telemedicine and the availability of in person appointments. I also discussed with the patient that there may be a patient responsible charge related to this service. The patient expressed understanding and agreed to proceed.  Justin [name] [id number] 78 y.o.  05/13/2024 8:59 AM  History of Present Illness:  Patient is evaluated by phone session.  He could not do the video because he was not able to login to MyChart.  We been requesting him to either video visits or in person and patient thought that he can do video visit today however it did not happen.  He also reported that due to leg pain has not able to sleeping very well.  Recently had a colonoscopy and some of the polyps were removed because they were precancerous and he will have another colonoscopy in 3 years.  He reported chronic anxiety and nervousness because of his general physical health but most of the time medicine working.  His biggest concern is pain in his leg.  He could not drive as much because of the pain.  He also noticed some time blood pressure fluctuates but denies any dizziness, fall, chest pain.  Denies any mania or psychosis.  He is in therapy with Harlene.  Occasionally he has nightmares and flashback but reported medicine had helped him a lot because intensity and frequency is not as bad as he used to be.  He is taking Lamictal  and Thorazine .  He has no rash, itching tremors or shakes.  He had cut down his Xanax  and has not taken in past 3 weeks.  Sometimes if he needed to take only half tablet of Xanax  which is prescribed by PCP.  He reported weight is stable.  Appetite is unchanged from the past.  He denies any tremors.  He has taken the  extra Thorazine  to help sleep but it makes him very groggy next day.  Denies drinking or using any illegal substances.  Past Psychiatric History: H/O suicidal attempt by overdose on Ambien  twice, hydroxyzine  and alcohol. H/O bipolar disorder with multiple inpatient.  Did IOP. Last ER visit Sept 2020 and inpatient October 2018 at [facility]. Tried Zyprexa , Cymbalta , Seroquel , Vistaril , Neurontin , Elavil, Wellbutrin and triptal. Tried low-dose doxepin  and trazodone . Had a reaction with Ativan  and pain medication.  Saw Dr. Elodie Anon in past.   Past Medical History:  Diagnosis Date   Anemia    Anxiety and depression    Aortic insufficiency 03/11/2022   Echocardiogram 02/2022: EF 70-75, no RWMA, moderate LVH, normal RVSF, normal PASP (RVSP 20), moderate LAE, mild MR, moderate AI, AV sclerosis without stenosis, no aortic root or ascending aorta dilation   Arthritis    knees, c-spine // gets lumbar ESI q 6 mos   Bipolar disorder (HCC)    CAD (coronary artery disease)    [place]  08/2009 >> LHC ([facility]) - mLAD 70, Dx 65 >> PCI:  3x15 mm Endeavor DES to mLAD and 2.5x12 mm Endeavor DES to Dx // S/p NSTEMI >> LHC 8/12 ([facility]) - LM ok, LAD prox and mid 40; LAD stent ok, Dx stent ok, mRCA 30, mLCx 50 >> med Rx // ETT-Echo 2/17 ([facility]): Normal, EF 55-60 at rest   Cataract 2023   bilaterally  and have been removed   Chicken pox    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HCC)    Grief    History of echocardiogram    Echo 3/19: Mild concentric LVH, EF 60-65, normal wall motion, grade 1 diastolic dysfunction, mild AI, mildly dilated aortic root (40 mm), MAC, mild LAE, atrial septal lipomatous hypertrophy // Echo 8/22: EF 55-60, no RWMA, mild LVH, GRII DD, normal RVSF, RVSP 40, mild LAE, trivial MR, mild-moderate AI, AV sclerosis without stenosis, mild dilation of aortic root (39 mm)   History of non-ST elevation myocardial infarction (NSTEMI) 2010   History of nuclear stress test    Nuclear stress test 3/19: EF 57,  inferior/inferoseptal/inferolateral defect consistent with probable soft tissue attenuation (cannot exclude subendocardial scar), no ischemia, intermediate risk >> Echo 3/19 normal EF, normal wall motion   History of pneumonia 2011   History of prostatitis 1990s   Hx of adenomatous colonic polyps 06/22/2016   Hyperlipidemia    Hypertension    Migraines    prior history   Myocardial infarction [facility])    PTSD (post-traumatic stress disorder)     Outpatient Encounter Medications as of 05/13/2024  Medication Sig   ALPRAZolam  (XANAX ) 1 MG tablet TAKE 1/2 TO 1 TABLET BY MOUTH ONCE DAILY   aspirin  EC 81 MG tablet Take 1 tablet (81 mg total) by mouth daily. Swallow whole.   Blood Pressure Monitoring (OMRON 3 SERIES BP MONITOR) DEVI Use as directed to monitor blood pressure.   chlorproMAZINE  (THORAZINE ) 10 MG tablet Take 1 tablet (10 mg total) by mouth at bedtime.   diltiazem  (CARDIZEM  CD) 240 MG 24 hr capsule Take 1 capsule (240 mg total) by mouth daily.   isosorbide  mononitrate (IMDUR ) 30 MG 24 hr tablet TAKE 1 TABLET BY MOUTH DAILY   lamoTRIgine  (LAMICTAL ) 200 MG tablet Take 1 tablet (200 mg total) by mouth daily.   nitroGLYCERIN  (NITROSTAT ) 0.4 MG SL tablet Place 1 tablet under the tongue every 5 minutes as needed for chest pain   pantoprazole  (PROTONIX ) 40 MG tablet TAKE 1 TABLET BY MOUTH DAILY   rivaroxaban  (XARELTO ) 20 MG TABS tablet Take 1 tablet (20 mg total) by mouth daily.   rosuvastatin  (CRESTOR ) 40 MG tablet Take 1 tablet (40 mg total) by mouth daily.   No facility-administered encounter medications on file as of 05/13/2024.    Recent Results (from the past 2160 hours)  Surgical pathology ([facility])     Status: None   Collection Time: 04/12/24 12:00 AM  Result Value Ref Range   SURGICAL PATHOLOGY      SURGICAL PATHOLOGY [facility] [address] [place], [place] [address] Telephone [phone] or [phone] Fax [phone]  REPORT OF  SURGICAL PATHOLOGY   Accession #: [id number] Patient Name: Justin, [name] Visit # : [id number]  MRN: [id number] Physician: Avram Pitts DOB/Age 17-Apr-1946 (Age: 35) Gender: M Collected Date: 04/12/2024 Received Date: 04/15/2024  FINAL DIAGNOSIS       1. Surgical [P], colon, sigmoid x 4, transverse x 2, polyp (6) :       - FRAGMENTS OF TUBULAR ADENOMA.       DATE SIGNED OUT: 04/16/2024 ELECTRONIC SIGNATURE : Coronel Md, Misti, Sports administrator, Electronic Signature  MICROSCOPIC DESCRIPTION  CASE COMMENTS STAINS USED IN DIAGNOSIS: H&E H&E-2    CLINICAL HISTORY  SPECIMEN(S) OBTAINED 1. Surgical [P], Colon, Sigmoid X 4, Transverse X 2, Polyp (6)  SPECIMEN COMMENTS: 1. Iron deficiency anemia, unspecified iron deficiency anemia type; abnormal CT scan,  colon; esophageal dysphagia; los s of weight; benign neoplasm of sigmoid colon; benign neoplasm of transverse colon SPECIMEN CLINICAL INFORMATION: 1. R/O adenoma    Gross Description 1. Received in formalin are tan, soft tissue fragments that are submitted in toto. Number: 6, Size: 0.3 cm smallest to 1.4 cm largest, (1B) ( TT )        Report signed out from the following location(s) [place]. [facility] [address] [place], [place] [address] CLIA #: [id number]  [facility] [address] [place], [place] [address] CLIA #: [id number]   CBC     Status: Abnormal   Collection Time: 04/12/24  1:24 PM  Result Value Ref Range   WBC 3.7 (L) 4.0 - 10.5 K/uL   RBC 4.24 4.22 - 5.81 Mil/uL   Platelets 229.0 150.0 - 400.0 K/uL   Hemoglobin 11.6 (L) 13.0 - 17.0 g/dL   HCT 63.5 (L) 60.9 - 47.9 %   MCV 85.8 78.0 - 100.0 fl   MCHC 31.9 30.0 - 36.0 g/dL   RDW 84.7 88.4 - 84.4 %     Psychiatric Specialty Exam: Physical Exam  Review of Systems  Musculoskeletal:        Leg pain  Neurological:  Positive for numbness.  Psychiatric/Behavioral:  Positive for sleep disturbance. The patient is  nervous/anxious.     Weight 172 lb (78 kg).There is no height or weight on file to calculate BMI.  General Appearance: NA  Eye Contact:  NA  Speech:  Slow  Volume:  Decreased  Mood:  Anxious  Affect:  NA  Thought Process:  Descriptions of Associations: Intact  Orientation:  Full (Time, Place, and Person)  Thought Content:  Rumination  Suicidal Thoughts:  No  Homicidal Thoughts:  No  Memory:  Immediate;   Good Recent;   Fair Remote;   Fair  Judgement:  Fair  Insight:  Shallow  Psychomotor Activity:  NA  Concentration:  Concentration: Fair and Attention Span: Fair  Recall:  Fair  Fund of Knowledge:  Good  Language:  Good  Akathisia:  No  Handed:  Right  AIMS (if indicated):     Assets:  Communication Skills Desire for Improvement Housing Transportation  ADL's:  Intact  Cognition:  WNL  Sleep:  fair due leg pain       01/16/2024    1:04 PM 12/26/2023    2:49 PM 07/05/2023   11:13 AM 01/12/2023   10:33 AM 12/22/2022    1:16 PM  Depression screen PHQ 2/9  Decreased Interest 0 0 0 0 1  Down, Depressed, Hopeless 0 3 0 0 1  PHQ - 2 Score 0 3 0 0 2  Altered sleeping 0 0  3 0  Tired, decreased energy 0 1  0 0  Change in appetite 0 0  3 0  Feeling bad or failure about yourself  0 0  0 0  Trouble concentrating 0 0  0 0  Moving slowly or fidgety/restless 0 3  0 0  Suicidal thoughts 0 0  0 0  PHQ-9 Score 0 7  6 2   Difficult doing work/chores Not difficult at all Very difficult       Assessment/Plan: Bipolar 1 disorder, mixed, moderate (HCC) - Plan: chlorproMAZINE  (THORAZINE ) 10 MG tablet, lamoTRIgine  (LAMICTAL ) 200 MG tablet  PTSD (post-traumatic stress disorder) - Plan: chlorproMAZINE  (THORAZINE ) 10 MG tablet, lamoTRIgine  (LAMICTAL ) 200 MG tablet  Primary insomnia - Plan: chlorproMAZINE  (THORAZINE ) 10 MG tablet  Patient  is 78 year old man with history of hypertension, A-fib, coronary artery disease, peripheral neuropathy, vitamin D  deficiency, leg pain, bipolar disorder,  PTSD and chronic insomnia.  Reviewed blood work results.  Patient has anemia and recently had colonoscopy and polyps were removed which were precancerous.  Reviewed current medication.  Encourage considering pain management because he feels his life is stuck because of his chronic pain and he cannot sleep very well or drive for longer distance.  Overall he feels psychiatric medicine working and helping and seeing therapist has been very helpful.  He has no rash or any itching.  Continue Lamictal  200 mg daily and Thorazine  10 mg at bedtime.  I discussed taking a second dose of Thorazine  will drop the blood pressure and given the history of heart disease I would not recommend.  Patient will look into pain management to help his chronic pain.  Encouraged to continue therapy on a regular basis.  Recommend to call back if is any question or any concern.  I will see him in the office in 3 months.  Provided psychoeducation about the medication, long-term side effects and discussed the prognosis of his illness.   Follow Up Instructions:     I discussed the assessment and treatment plan with the patient. The patient was provided an opportunity to ask questions and all were answered. The patient agreed with the plan and demonstrated an understanding of the instructions.   The patient was advised to call back or seek an in-person evaluation if the symptoms worsen or if the condition fails to improve as anticipated.    Collaboration of Care: Other provider involved in patient's care AEB notes are available in epic to review  Patient/Guardian was advised Release of Information must be obtained prior to any record release in order to collaborate their care with an outside provider. Patient/Guardian was advised if they have not already done so to contact the registration department to sign all necessary forms in order for us  to release information regarding their care.   Consent: Patient/Guardian gives verbal  consent for treatment and assignment of benefits for services provided during this visit. Patient/Guardian expressed understanding and agreed to proceed.     Total encounter time 28 minutes which includes face-to-face time, chart reviewed, care coordination, order entry and documentation during this encounter.  Discussed long-term prognosis and provided psychoeducation of the medications.  Note: This document was prepared by [employer] voice dictation technology and any errors that results from this process are unintentional.    Leni ONEIDA Client, MD 05/13/2024

## 2024-05-15 ENCOUNTER — Telehealth: Payer: Self-pay

## 2024-05-15 NOTE — Telephone Encounter (Signed)
 Ordering provider: Jackee Wyn Raddle., NP Associated diagnoses:  A. Coronary artery disease involving native coronary artery of native heart with angina pectoris - Primary B. Persistent atrial fibrillation  C. Sleep disturbance  WatchPAT PA obtained on 05/15/2024 by Lucie DELENA Ku, CMA. Authorization: No prior authorization required per [facility] Medicare Patient notified of PIN ([id number]) on 05/15/2024 via Notification Method: MyChart message.  Phone note routed to covering staff for follow-up.

## 2024-05-30 NOTE — Telephone Encounter (Signed)
 Contacted the patient to remind him to wear his Itamar with in the next 30 days.   Advised the patient that there is a help number in or on the box if he needs assistance putting it on.   He voiced  understanding.

## 2024-07-19 ENCOUNTER — Other Ambulatory Visit: Payer: Self-pay | Admitting: Cardiovascular Disease

## 2024-07-23 ENCOUNTER — Other Ambulatory Visit: Payer: Self-pay | Admitting: Cardiovascular Disease

## 2024-08-01 ENCOUNTER — Ambulatory Visit (HOSPITAL_COMMUNITY): Admitting: Psychiatry

## 2024-08-05 NOTE — Progress Notes (Deleted)
°  Electrophysiology Office Follow up Visit Note:    Date:  08/05/2024   ID:  Justin [name], DOB January 29, 1946, MRN [id number]  PCP:  Norleen Lynwood ORN, MD  [facility] Cardiologist:  Ozell Fell, MD  [facility] Electrophysiologist:  OLE ONEIDA HOLTS, MD    Interval History:     Justin [name] is a 78 y.o. male who presents for a follow up visit.   I last saw the patient May 26, 2023.  The patient has a history of atrial fibrillation with prior catheter ablations including 1 February 14, 2023.  He was maintaining sinus rhythm at that his last appointment with me.  He takes Eliquis  for stroke prophylaxis.  He was having typical sounding angina at the last appointment and I ordered a heart catheterization. The heart cath showed severe coronary artery disease that was treated with PCI.       Past medical, surgical, social and family history were reviewed.  ROS:   Please see the history of present illness.    All other systems reviewed and are negative.  EKGs/Labs/Other Studies Reviewed:    The following studies were reviewed today:          Physical Exam:    VS:  There were no vitals taken for this visit.    Wt Readings from Last 3 Encounters:  04/12/24 172 lb 9.6 oz (78.3 kg)  01/29/24 172 lb 6.4 oz (78.2 kg)  01/25/24 174 lb (78.9 kg)     GEN: no distress CARD: RRR, No MRG RESP: No IWOB. CTAB.      ASSESSMENT:    No diagnosis found. PLAN:    In order of problems listed above:  #Atrial fibrillation Persistent.  2 prior catheter ablations.  On Xarelto  for stroke prophylaxis Maintaining sinus rhythm***  #Coronary artery disease No ischemic symptoms today Follows with Dr. Fell Continue aspirin , statin  I discussed my upcoming departure from [facility] during today's clinic appointment.  The patient will continue to follow-up with one of my EP partners moving forward.  Follow-up 1 year with EP APP   Signed, Ole Holts, MD, [facility],  [facility] 08/05/2024 9:06 AM    Electrophysiology [facility]

## 2024-08-06 ENCOUNTER — Ambulatory Visit: Admitting: Cardiology

## 2024-08-08 ENCOUNTER — Ambulatory Visit: Admitting: Cardiology

## 2024-08-13 ENCOUNTER — Ambulatory Visit: Admitting: Cardiology

## 2024-08-13 NOTE — Progress Notes (Deleted)
" °  Electrophysiology Office Follow up Visit Note:    Date:  08/13/2024   ID:  Justin [name], DOB [birth year], MRN [id number]  PCP:  Norleen Lynwood ORN, MD  [facility] Cardiologist:  Ozell Fell, MD  [facility] Electrophysiologist:  OLE ONEIDA HOLTS, MD    Interval History:     Justin [name] is a 78 y.o. male who presents for a follow up visit.   I last saw the patient May 26, 2023.  The patient has a history of atrial fibrillation with prior catheter ablations including 1 February 14, 2023.  He was maintaining sinus rhythm at that his last appointment with me.  He takes Eliquis  for stroke prophylaxis.  He was having typical sounding angina at the last appointment and I ordered a heart catheterization. The heart cath showed severe coronary artery disease that was treated with PCI.       Past medical, surgical, social and family history were reviewed.  ROS:   Please see the history of present illness.    All other systems reviewed and are negative.  EKGs/Labs/Other Studies Reviewed:    The following studies were reviewed today:          Physical Exam:    VS:  There were no vitals taken for this visit.    Wt Readings from Last 3 Encounters:  04/12/24 172 lb 9.6 oz (78.3 kg)  01/29/24 172 lb 6.4 oz (78.2 kg)  01/25/24 174 lb (78.9 kg)     GEN: no distress CARD: RRR, No MRG RESP: No IWOB. CTAB.      ASSESSMENT:    No diagnosis found. PLAN:    In order of problems listed above:  #Atrial fibrillation Persistent.  2 prior catheter ablations.  On Xarelto  for stroke prophylaxis Maintaining sinus rhythm***  #Coronary artery disease No ischemic symptoms today Follows with Dr. Fell Continue aspirin , statin  I discussed my upcoming departure from [facility] during today's clinic appointment.  The patient will continue to follow-up with one of my EP partners moving forward.  Follow-up 1 year with EP APP   Signed, Ole Holts, MD, [facility],  [facility] 08/13/2024 7:42 AM    Electrophysiology [facility] "

## 2024-08-16 ENCOUNTER — Ambulatory Visit: Admitting: Family Medicine

## 2024-08-16 ENCOUNTER — Ambulatory Visit

## 2024-08-16 ENCOUNTER — Ambulatory Visit: Admitting: Cardiology

## 2024-08-16 ENCOUNTER — Ambulatory Visit: Payer: Self-pay | Admitting: Family Medicine

## 2024-08-16 ENCOUNTER — Encounter: Payer: Self-pay | Admitting: Family Medicine

## 2024-08-16 ENCOUNTER — Encounter: Payer: Self-pay | Admitting: Cardiology

## 2024-08-16 VITALS — BP 142/80 | HR 74 | Temp 98.7°F | Ht 71.0 in | Wt 175.2 lb

## 2024-08-16 VITALS — BP 140/72 | HR 78 | Ht 71.0 in | Wt 176.8 lb

## 2024-08-16 DIAGNOSIS — J069 Acute upper respiratory infection, unspecified: Secondary | ICD-10-CM

## 2024-08-16 DIAGNOSIS — R0602 Shortness of breath: Secondary | ICD-10-CM | POA: Diagnosis not present

## 2024-08-16 DIAGNOSIS — R051 Acute cough: Secondary | ICD-10-CM | POA: Diagnosis not present

## 2024-08-16 DIAGNOSIS — R6889 Other general symptoms and signs: Secondary | ICD-10-CM | POA: Diagnosis not present

## 2024-08-16 DIAGNOSIS — I2510 Atherosclerotic heart disease of native coronary artery without angina pectoris: Secondary | ICD-10-CM | POA: Diagnosis not present

## 2024-08-16 DIAGNOSIS — I4811 Longstanding persistent atrial fibrillation: Secondary | ICD-10-CM

## 2024-08-16 DIAGNOSIS — M25541 Pain in joints of right hand: Secondary | ICD-10-CM

## 2024-08-16 DIAGNOSIS — B9689 Other specified bacterial agents as the cause of diseases classified elsewhere: Secondary | ICD-10-CM

## 2024-08-16 DIAGNOSIS — M25542 Pain in joints of left hand: Secondary | ICD-10-CM

## 2024-08-16 DIAGNOSIS — I4819 Other persistent atrial fibrillation: Secondary | ICD-10-CM

## 2024-08-16 DIAGNOSIS — I25119 Atherosclerotic heart disease of native coronary artery with unspecified angina pectoris: Secondary | ICD-10-CM

## 2024-08-16 LAB — POC COVID19 BINAXNOW: SARS Coronavirus 2 Ag: NEGATIVE

## 2024-08-16 LAB — POCT INFLUENZA A/B
Influenza A, POC: NEGATIVE
Influenza B, POC: NEGATIVE

## 2024-08-16 MED ORDER — ISOSORBIDE MONONITRATE ER 60 MG PO TB24: 60.0000 mg | ORAL_TABLET | Freq: Every day | ORAL | 3 refills | Status: AC

## 2024-08-16 MED ORDER — AMOXICILLIN-POT CLAVULANATE 875-125 MG PO TABS: 1.0000 | ORAL_TABLET | Freq: Two times a day (BID) | ORAL | 0 refills | Status: AC

## 2024-08-16 MED ORDER — TRAMADOL HCL 50 MG PO TABS: 50.0000 mg | ORAL_TABLET | Freq: Three times a day (TID) | ORAL | 0 refills | Status: AC | PRN

## 2024-08-16 MED ORDER — PROMETHAZINE HCL 6.25 MG/5ML PO SOLN: 6.2500 mg | Freq: Four times a day (QID) | ORAL | 0 refills | Status: AC | PRN

## 2024-08-16 NOTE — Progress Notes (Signed)
" °  Electrophysiology Office Follow up Visit Note:    Date:  08/16/2024   ID:  Justin [name], DOB 27-May-1946, MRN [id number]  PCP:  Norleen Lynwood ORN, MD  [facility] Cardiologist:  Ozell Fell, MD  [facility] Electrophysiologist:  OLE ONEIDA HOLTS, MD    Interval History:     Justin [name] is a 78 y.o. male who presents for a follow up visit.   I last saw the patient May 26, 2023.  The patient has a history of atrial fibrillation with prior catheter ablations including 1 February 14, 2023.  He was maintaining sinus rhythm at that his last appointment with me.  He takes Eliquis  for stroke prophylaxis.  He was having typical sounding angina at the last appointment and I ordered a heart catheterization. The heart cath showed severe coronary artery disease that was treated with PCI.  Today he tells me he is doing okay.  He did have 1 episode of chest discomfort during the last few weeks for which he took a nitroglycerin .  The nitroglycerin  did alleviate the discomfort.  He is an active man and reports no reproducible chest discomfort with exertion.  He does take Imdur .       Past medical, surgical, social and family history were reviewed.  ROS:   Please see the history of present illness.    All other systems reviewed and are negative.  EKGs/Labs/Other Studies Reviewed:    The following studies were reviewed today:          Physical Exam:    VS:  BP (!) 140/72 (BP Location: Right Arm, Patient Position: Sitting, Cuff Size: Large)   Pulse 78   Ht 5' 11 (1.803 m)   Wt 176 lb 12.8 oz (80.2 kg)   SpO2 97%   BMI 24.66 kg/m     Wt Readings from Last 3 Encounters:  08/16/24 176 lb 12.8 oz (80.2 kg)  04/12/24 172 lb 9.6 oz (78.3 kg)  01/29/24 172 lb 6.4 oz (78.2 kg)     GEN: no distress CARD: RRR, No MRG RESP: No IWOB. CTAB.      ASSESSMENT:    1. Coronary artery disease involving native coronary artery of native heart with angina pectoris   2. Persistent  atrial fibrillation (HCC)    PLAN:    In order of problems listed above:  #Atrial fibrillation Persistent.  2 prior catheter ablations.  On Xarelto  for stroke prophylaxis Maintaining sinus rhythm  #Coronary artery disease Episode of chest discomfort during the last few weeks as detailed above. Follows with Dr. Fell Continue aspirin , statin Increase Imdur  to 60 mg by mouth once daily I am going to have him follow-up with Dr. Fell in the next few weeks  I discussed my upcoming departure from [facility] during today's clinic appointment.  The patient will continue to follow-up with one of my EP partners moving forward.  Follow-up 1 year with EP APP   Signed, Ole Holts, MD, [facility], [facility] 08/16/2024 10:55 AM    Electrophysiology [facility] "

## 2024-08-16 NOTE — Progress Notes (Signed)
 "  Acute Office Visit  Subjective:     Patient ID: Justin [name], male    DOB: 1945/09/27, 78 y.o.   MRN: [id number]  Chief Complaint  Patient presents with   Acute Visit    C/o body aching, cough with white phlegm, fatigue, and chest congestion ongoing 4-5 days, cough not present today, arthritis in thumbs has also been severe for several days  Has used tramadol  before that has helped    HPI  Discussed the use of AI scribe software for clinical note transcription with the patient, who gave verbal consent to proceed.  History of Present Illness Justin [name] is a 78 year old male who presents with generalized body aches and fatigue.  Generalized myalgias and fatigue - 4 to 5 days of generalized body aches and unusual fatigue - Pain distributed bilaterally along the spine, shoulders, back, neck, and down to the ankles - Greatest discomfort in shoulders and neck - Desires to return to usual activities with granddaughters  Cough and respiratory symptoms - Persistent cough with white sputum - Cough was [name] frequent a few days ago and is now somewhat improved - No known sick contacts - No recent fever - History of severe pneumonia with hospitalization in 2010  Hand dysfunction and pain - Increasing difficulty using hands, especially thumbs - Unable to open containers without assistance - Tramadol  has provided pain relief in the past  Cardiovascular history and medications - Takes Xarelto  and Crestor  - Uses nitroglycerin  as needed - Recent cardiology evaluation with satisfactory EKG results - History of heart catheterization complicated by blood thinners     ROS Per HPI      Objective:    BP (!) 142/80 (BP Location: Left Arm, Patient Position: Sitting)   Pulse 74   Temp 98.7 F (37.1 C) (Temporal)   Ht 5' 11 (1.803 m)   Wt 175 lb 3.2 oz (79.5 kg)   SpO2 98%   BMI 24.44 kg/m    Physical Exam Vitals and nursing note reviewed.  Constitutional:       General: He is not in acute distress.    Comments: Appears fatigued  HENT:     Head: Normocephalic and atraumatic.     Right Ear: External ear normal.     Left Ear: External ear normal.     Nose: Nose normal.     Mouth/Throat:     Mouth: Mucous membranes are moist.     Pharynx: Oropharynx is clear.  Eyes:     Extraocular Movements: Extraocular movements intact.  Cardiovascular:     Rate and Rhythm: Normal rate and regular rhythm.     Pulses: Normal pulses.     Heart sounds: Normal heart sounds.  Pulmonary:     Effort: Pulmonary effort is normal. No respiratory distress.     Breath sounds: Normal breath sounds. No wheezing, rhonchi or rales.     Comments: Productive cough in office Musculoskeletal:        General: Normal range of motion.     Cervical back: Normal range of motion.     Right lower leg: No edema.     Left lower leg: No edema.  Lymphadenopathy:     Cervical: No cervical adenopathy.  Skin:    General: Skin is warm and dry.  Neurological:     General: No focal deficit present.     Mental Status: He is alert and oriented to person, place, and time.  Psychiatric:  Mood and Affect: Mood normal.        Behavior: Behavior normal.     Results for orders placed or performed in visit on 08/16/24  POC COVID-19 BinaxNow  Result Value Ref Range   SARS Coronavirus 2 Ag Negative Negative  POCT Influenza A/B  Result Value Ref Range   Influenza A, POC Negative Negative   Influenza B, POC Negative Negative        Assessment & Plan:   Assessment and Plan Assessment & Plan Bacterial upper respiratory infection Negative for COVID-19 and influenza. Differential includes pneumonia due to symptom severity and history. - Ordered chest x-ray to evaluate for pneumonia. - Prescribed Augmentin  twice daily for seven days. - Prescribed cough syrup. - Will adjust antibiotic duration or type based on x-ray findings.  Arthralgia of bilateral hands Chronic hand pain  affecting thumb with task difficulty. - Advised to schedule appointment for hand evaluation and possible ultrasound. - Refilled tramadol  for pain management. - is est with sports medicine at [facility]  Coronary artery disease of native artery of native heart with angina Managed with Xarelto , Crestor , and nitroglycerin . Recent normal EKG from cardiologist. - Continue current medications: Xarelto , Crestor , and nitroglycerin  as needed. - chronic, stable  Longstanding persistent atrial fibrillation Managed with anticoagulation therapy. - Continue current anticoagulation therapy. - chronic, stable     Orders Placed This Encounter  Procedures   DG Chest 2 View    Standing Status:   Future    Number of Occurrences:   1    Expiration Date:   02/14/2025    Reason for Exam (SYMPTOM  OR DIAGNOSIS REQUIRED):   cough, sob    Preferred imaging location?:   [place]   POC COVID-19 BinaxNow   POCT Influenza A/B     Meds ordered this encounter  Medications   traMADol  (ULTRAM ) 50 MG tablet    Sig: Take 1 tablet (50 mg total) by mouth every 8 (eight) hours as needed for up to 5 days.    Dispense:  15 tablet    Refill:  0   amoxicillin -clavulanate (AUGMENTIN ) 875-125 MG tablet    Sig: Take 1 tablet by mouth 2 (two) times daily for 7 days.    Dispense:  14 tablet    Refill:  0   promethazine  (PHENERGAN ) 6.25 MG/5ML solution    Sig: Take 5 mLs (6.25 mg total) by mouth every 6 (six) hours as needed for nausea or vomiting.    Dispense:  120 mL    Refill:  0    Return if symptoms worsen or fail to improve.  Justin LITTIE Ku, FNP  "

## 2024-08-16 NOTE — Patient Instructions (Signed)
 Medication Instructions:  Your physician has recommended you make the following change in your medication:  1) INCREASE Imdur  (isosorbide ) to 60 mg once daily  *If you need a refill on your cardiac medications before your next appointment, please call your pharmacy*  Follow-Up: At [facility], you and your health needs are our priority.  As part of our continuing mission to provide you with exceptional heart care, our providers are all part of one team.  This team includes your primary Cardiologist (physician) and Advanced Practice Providers or APPs (Physician Assistants and Nurse Practitioners) who all work together to provide you with the care you need, when you need it.  Your next appointment:   6-8 weeks  Provider:   Ozell Fell, MD    Follow up with EP APP in one year

## 2024-08-16 NOTE — Patient Instructions (Signed)
 I have sent in Augmentin  for you to take twice a day for 7 days.  This medication can upset your stomach, so I tell everyone to take it with a meal.  We are getting an xray today. We will be in contact with any abnormal results that require further attention.  I have sent in hydrocodone  cough syrup for you to take 5 mL once daily in the evening as needed for cough.  This medication may make you sleepy.  Do not drive or operate heavy machinery while taking this medication.  Follow-up with me for new or worsening symptoms.

## 2024-08-19 ENCOUNTER — Telehealth: Payer: Self-pay

## 2024-08-19 NOTE — Telephone Encounter (Signed)
 Copied from CRM #[id number]. Topic: Clinical - Lab/Test Results >> Aug 19, 2024  9:44 AM Corin V wrote: Reason for CRM: Patient calling to get Xray results from 08/16/24. Called [facility] and was asked to send CRM for callback to relay results. Callback: [phone].

## 2024-08-19 NOTE — Telephone Encounter (Signed)
 Spoke with patient, gave xray comments. Pharmacy had to order his medications. Should be ready today after 2pm. Asked patient if he'd like for his medications to be sent elsewhere due to the wait, he declined since they have already been ordered.  Advised patient to call us  back after a few days of medications if he is not feeling better.

## 2024-08-20 ENCOUNTER — Ambulatory Visit: Admitting: Emergency Medicine

## 2024-08-27 NOTE — Progress Notes (Unsigned)
 "               Justin [name] [name] [address] [place] [address] Phone: [phone]   Assessment and Plan:     1. Bilateral thumb pain (Primary) 2. 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  Elected for repeat CSI today.  Tolerated well per note below.  Patient had increased bleeding risk on chronic anticoagulation with Xarelto  - Do not recommend p.o. NSAIDs due to chronic anticoagulation with Xarelto  - Use Tylenol  500 to 1000 mg tablets 2-3 times a day for day-to-day pain relief -Start topical Voltaren gel and warm hand baths for pain relief    Procedure:  Carpometacarpal Joint Injection Side: Bilateral Diagnosis: Flare of CMC OA    After explaining the procedure, viable alternatives, risks, and answering any questions, consent was given verbally. The site was cleaned with alcohol prep.  An ultrasound transducer was placed on the  [facility] joint.  A steroid injection was performed   using 0.5ml of 1% lidocaine  without epinephrine  and 20 mg of Kenalog  40. This was well tolerated and resulted in  relief.  Needle was removed and dressing placed and post injection instructions were given including  a discussion of likely return of pain today after the anesthetic wears off (with the possibility of worsened pain) until the steroid starts to work in 1-3 days.   Pt was advised to call or return to clinic if these symptoms worsen or fail to improve as anticipated.    Pertinent previous records reviewed include bilateral hand x-ray 2024   Follow Up: As needed if no improvement or worsening of symptoms.  If patient receives at least 3 months relief, could consider repeat [facility] CSI    Subjective:   I, Justin [name], am serving as a neurosurgeon for Doctor Justin [name]   Chief Complaint: bilat hand pain   HPI:   08/28/2024 Patient is a 79 year old male with bilat hand pain. Patient states a year and a half of pain had bilat CSI and the pain came back about 2-3 months ago. Decreased grip strength. Decreased ROM. Bialt CMC joint pain. Left hand is more painful   Relevant Historical Information: Hypertension, CAD, atrial fibrillation on chronic anticoagulation with Xarelto   Additional pertinent review of systems negative.  Current Medications[1]   Objective:     Vitals:   08/28/24 1258  BP: (!) 140/80  Pulse: 71  SpO2: 99%  Weight: 169 lb (76.7 kg)  Height: 5' 11 (1.803 m)      Body mass index is 23.57 kg/m.    Physical Exam:    General: Appears well, nad, nontoxic and pleasant Neuro:sensation intact, strength is 5/5 in upper extremities, muscle tone wnl Skin:no susupicious lesions or rashes  Bilateral hand/Wrist:   Bony deformity at bilateral CMC TTP bilateral CMC ROM  Ext 90, flexion 70, radial/ulnar deviation 30 nttp over the snuff box, dorsal carpals, volar carpals, radial styloid, ulnar styloid,  tfcc  No pain with resisted ext, flex or deviation    Electronically signed by:  Justin [name] [name] 1:30 PM 08/28/2024     [1]  Current Outpatient Medications:    ALPRAZolam  (XANAX ) 1 MG tablet, TAKE 1/2 TO 1 TABLET BY  MOUTH ONCE DAILY (Patient taking differently: TAKING PRN), Disp: 30 tablet, Rfl: 2   aspirin  EC 81 MG tablet, Take 1 tablet (81 mg total) by mouth daily. Swallow whole., Disp: , Rfl:    Blood Pressure Monitoring (OMRON 3 SERIES BP MONITOR) DEVI, Use as directed to monitor blood pressure., Disp: 1 each, Rfl: 0   chlorproMAZINE  (THORAZINE ) 10 MG tablet, Take 1 tablet (10 mg total) by mouth at bedtime. (Patient not taking: Reported on 08/16/2024), Disp: 30 tablet, Rfl: 2   diltiazem  (CARDIZEM  CD) 240 MG 24  hr capsule, Take 1 capsule (240 mg total) by mouth daily., Disp: 90 capsule, Rfl: 3   isosorbide  mononitrate (IMDUR ) 60 MG 24 hr tablet, Take 1 tablet (60 mg total) by mouth daily., Disp: 90 tablet, Rfl: 3   lamoTRIgine  (LAMICTAL ) 200 MG tablet, Take 1 tablet (200 mg total) by mouth daily., Disp: 30 tablet, Rfl: 2   nitroGLYCERIN  (NITROSTAT ) 0.4 MG SL tablet, Place 1 tablet under the tongue every 5 minutes as needed for chest pain, Disp: 25 tablet, Rfl: 3   pantoprazole  (PROTONIX ) 40 MG tablet, TAKE 1 TABLET BY MOUTH DAILY, Disp: 90 tablet, Rfl: 3   promethazine  (PHENERGAN ) 6.25 MG/5ML solution, Take 5 mLs (6.25 mg total) by mouth every 6 (six) hours as needed for nausea or vomiting., Disp: 120 mL, Rfl: 0   rivaroxaban  (XARELTO ) 20 MG TABS tablet, Take 1 tablet (20 mg total) by mouth daily., Disp: 30 tablet, Rfl: 5   rosuvastatin  (CRESTOR ) 40 MG tablet, TAKE 1 TABLET BY MOUTH DAILY, Disp: 90 tablet, Rfl: 0  "

## 2024-08-28 ENCOUNTER — Ambulatory Visit: Admitting: Sports Medicine

## 2024-08-28 VITALS — BP 140/80 | HR 71 | Ht 71.0 in | Wt 169.0 lb

## 2024-08-28 DIAGNOSIS — M79645 Pain in left finger(s): Secondary | ICD-10-CM

## 2024-08-28 DIAGNOSIS — M19031 Primary osteoarthritis, right wrist: Secondary | ICD-10-CM

## 2024-08-28 DIAGNOSIS — M19032 Primary osteoarthritis, left wrist: Secondary | ICD-10-CM | POA: Diagnosis not present

## 2024-08-28 DIAGNOSIS — M79644 Pain in right finger(s): Secondary | ICD-10-CM | POA: Diagnosis not present

## 2024-08-28 NOTE — Patient Instructions (Signed)
 Tylenol  [phone] mg 2-3 times a day for pain relief   Voltaren gel over areas of pain   Warm hand bathes   As needed follow up. If you have 3 months relief we can redo injections

## 2024-08-29 ENCOUNTER — Telehealth: Payer: Self-pay

## 2024-08-29 ENCOUNTER — Other Ambulatory Visit: Payer: Self-pay | Admitting: Internal Medicine

## 2024-08-29 MED ORDER — ALPRAZOLAM 1 MG PO TABS: ORAL_TABLET | ORAL | 2 refills | Status: DC

## 2024-08-29 MED ORDER — ALPRAZOLAM 1 MG PO TABS: ORAL_TABLET | ORAL | 2 refills | Status: AC

## 2024-08-29 NOTE — Telephone Encounter (Signed)
 Called and let Pt know

## 2024-08-29 NOTE — Telephone Encounter (Signed)
 Copied from CRM [id number]. Topic: Clinical - Medication Question >> Aug 29, 2024 12:54 PM Nessti S wrote: Reason for CRM: pt called because he wanted to get a new prescription for ALPRAZolam  (XANAX ) 1 MG tablet. Please give him a call back soon as possible

## 2024-08-29 NOTE — Telephone Encounter (Signed)
 Ok this can be done, thanks

## 2024-08-30 ENCOUNTER — Ambulatory Visit

## 2024-08-30 DIAGNOSIS — Z23 Encounter for immunization: Secondary | ICD-10-CM | POA: Diagnosis not present

## 2024-08-30 NOTE — Progress Notes (Signed)
 After obtaining consent, and per orders of Dr. Norleen, injection of HD Flu given by Edsel CHRISTELLA Kerns. Patient tolerated well.

## 2024-09-03 ENCOUNTER — Telehealth: Payer: Self-pay | Admitting: Cardiovascular Disease

## 2024-09-03 DIAGNOSIS — I4819 Other persistent atrial fibrillation: Secondary | ICD-10-CM

## 2024-09-03 NOTE — Telephone Encounter (Signed)
" °*  STAT* If patient is at the pharmacy, call can be transferred to refill team.   1. Which medications need to be refilled? (please list name of each medication and dose if known) rivaroxaban  (XARELTO ) 20 MG TABS tablet    2. Would you like to learn more about the convenience, safety, & potential cost savings by using the [facility]?    3. Are you open to using the [facility] (Type [facility].    4. Which pharmacy/location (including street and city if local pharmacy) is medication to be sent to? [facility] - [place], [place] - [address] [address]     5. Do they need a 30 day or 90 day supply? 30      "

## 2024-09-05 MED ORDER — RIVAROXABAN 20 MG PO TABS: 20.0000 mg | ORAL_TABLET | Freq: Every day | ORAL | 1 refills | Status: AC

## 2024-09-05 NOTE — Telephone Encounter (Signed)
 RX sent in.

## 2024-09-05 NOTE — Telephone Encounter (Signed)
 Prescription refill request for Xarelto  received.  Indication: A-Fib Last office visit: 01/25/24 Weight: 76.7 KG Age: 79 Scr: 0.76 CrCl: 85 mL/min Pt Has Passed Parameters

## 2024-10-02 ENCOUNTER — Ambulatory Visit: Admitting: Cardiology

## 2025-01-16 ENCOUNTER — Ambulatory Visit

## 2025-01-16 ENCOUNTER — Encounter: Admitting: Internal Medicine
